# Patient Record
Sex: Male | Born: 1935 | Race: Black or African American | Hispanic: No | Smoking: Former smoker
Health system: Southern US, Community
[De-identification: ages and names within clinical notes are randomized; demographics above are authoritative.]

## PROBLEM LIST (undated history)

## (undated) ENCOUNTER — Emergency Department (HOSPITAL_COMMUNITY): Discharge status: Absent without leave | Payer: Medicare Other

## (undated) DIAGNOSIS — Z9289 Personal history of other medical treatment: Secondary | ICD-10-CM

## (undated) DIAGNOSIS — Z794 Long term (current) use of insulin: Secondary | ICD-10-CM

## (undated) DIAGNOSIS — I10 Essential (primary) hypertension: Secondary | ICD-10-CM

## (undated) DIAGNOSIS — C189 Malignant neoplasm of colon, unspecified: Secondary | ICD-10-CM

## (undated) DIAGNOSIS — E119 Type 2 diabetes mellitus without complications: Secondary | ICD-10-CM

## (undated) DIAGNOSIS — K922 Gastrointestinal hemorrhage, unspecified: Secondary | ICD-10-CM

## (undated) DIAGNOSIS — I5032 Chronic diastolic (congestive) heart failure: Secondary | ICD-10-CM

## (undated) DIAGNOSIS — F32A Depression, unspecified: Secondary | ICD-10-CM

## (undated) DIAGNOSIS — I4891 Unspecified atrial fibrillation: Secondary | ICD-10-CM

## (undated) DIAGNOSIS — F329 Major depressive disorder, single episode, unspecified: Secondary | ICD-10-CM

## (undated) DIAGNOSIS — IMO0001 Reserved for inherently not codable concepts without codable children: Secondary | ICD-10-CM

## (undated) DIAGNOSIS — E1140 Type 2 diabetes mellitus with diabetic neuropathy, unspecified: Secondary | ICD-10-CM

## (undated) DIAGNOSIS — I4892 Unspecified atrial flutter: Secondary | ICD-10-CM

## (undated) DIAGNOSIS — I509 Heart failure, unspecified: Secondary | ICD-10-CM

## (undated) DIAGNOSIS — N184 Chronic kidney disease, stage 4 (severe): Secondary | ICD-10-CM

## (undated) DIAGNOSIS — I2510 Atherosclerotic heart disease of native coronary artery without angina pectoris: Secondary | ICD-10-CM

## (undated) HISTORY — DX: Essential (primary) hypertension: I10

## (undated) HISTORY — PX: LEG SURGERY: SHX1003

---

## 1999-10-02 ENCOUNTER — Encounter: Payer: Self-pay | Admitting: Family Medicine

## 1999-10-02 ENCOUNTER — Encounter: Admission: RE | Admit: 1999-10-02 | Discharge: 1999-10-02 | Payer: Self-pay | Admitting: Family Medicine

## 1999-11-20 ENCOUNTER — Encounter: Admission: RE | Admit: 1999-11-20 | Discharge: 1999-11-20 | Payer: Self-pay | Admitting: Family Medicine

## 1999-11-20 ENCOUNTER — Encounter: Payer: Self-pay | Admitting: Family Medicine

## 1999-11-27 ENCOUNTER — Ambulatory Visit: Admission: RE | Admit: 1999-11-27 | Discharge: 1999-11-27 | Payer: Self-pay | Admitting: Family Medicine

## 1999-12-28 ENCOUNTER — Emergency Department (HOSPITAL_COMMUNITY): Admission: EM | Admit: 1999-12-28 | Discharge: 1999-12-28 | Payer: Self-pay | Admitting: Emergency Medicine

## 2000-01-22 ENCOUNTER — Encounter: Payer: Self-pay | Admitting: Family Medicine

## 2000-01-22 ENCOUNTER — Encounter: Admission: RE | Admit: 2000-01-22 | Discharge: 2000-01-22 | Payer: Self-pay | Admitting: Family Medicine

## 2000-10-10 ENCOUNTER — Encounter: Payer: Self-pay | Admitting: Family Medicine

## 2000-10-10 ENCOUNTER — Encounter: Admission: RE | Admit: 2000-10-10 | Discharge: 2000-10-10 | Payer: Self-pay | Admitting: Family Medicine

## 2006-07-23 ENCOUNTER — Ambulatory Visit: Payer: Self-pay | Admitting: Family Medicine

## 2006-07-29 ENCOUNTER — Ambulatory Visit: Payer: Self-pay | Admitting: Family Medicine

## 2007-05-06 ENCOUNTER — Encounter: Admission: RE | Admit: 2007-05-06 | Discharge: 2007-05-06 | Payer: Self-pay | Admitting: Family Medicine

## 2007-05-13 ENCOUNTER — Emergency Department (HOSPITAL_COMMUNITY): Admission: EM | Admit: 2007-05-13 | Discharge: 2007-05-13 | Payer: Self-pay | Admitting: Emergency Medicine

## 2007-06-23 ENCOUNTER — Emergency Department (HOSPITAL_COMMUNITY): Admission: EM | Admit: 2007-06-23 | Discharge: 2007-06-23 | Payer: Self-pay | Admitting: Emergency Medicine

## 2007-08-13 ENCOUNTER — Ambulatory Visit: Payer: Self-pay | Admitting: Family Medicine

## 2007-08-13 ENCOUNTER — Encounter: Admission: RE | Admit: 2007-08-13 | Discharge: 2007-08-13 | Payer: Self-pay | Admitting: Nephrology

## 2007-09-11 ENCOUNTER — Emergency Department (HOSPITAL_COMMUNITY): Admission: EM | Admit: 2007-09-11 | Discharge: 2007-09-11 | Payer: Self-pay | Admitting: *Deleted

## 2007-12-16 ENCOUNTER — Emergency Department (HOSPITAL_COMMUNITY): Admission: EM | Admit: 2007-12-16 | Discharge: 2007-12-16 | Payer: Self-pay | Admitting: Emergency Medicine

## 2007-12-25 ENCOUNTER — Emergency Department (HOSPITAL_COMMUNITY): Admission: EM | Admit: 2007-12-25 | Discharge: 2007-12-25 | Payer: Self-pay | Admitting: Emergency Medicine

## 2008-02-08 ENCOUNTER — Ambulatory Visit: Payer: Self-pay | Admitting: Family Medicine

## 2008-02-12 ENCOUNTER — Ambulatory Visit: Payer: Self-pay | Admitting: Family Medicine

## 2008-02-16 ENCOUNTER — Ambulatory Visit: Payer: Self-pay | Admitting: Family Medicine

## 2008-04-28 ENCOUNTER — Ambulatory Visit: Payer: Self-pay | Admitting: Family Medicine

## 2008-05-10 ENCOUNTER — Ambulatory Visit: Payer: Self-pay | Admitting: Family Medicine

## 2008-07-27 ENCOUNTER — Ambulatory Visit: Payer: Self-pay | Admitting: Family Medicine

## 2008-10-06 ENCOUNTER — Ambulatory Visit: Payer: Self-pay | Admitting: Family Medicine

## 2008-12-05 ENCOUNTER — Ambulatory Visit: Payer: Self-pay | Admitting: Family Medicine

## 2009-01-12 ENCOUNTER — Ambulatory Visit: Payer: Self-pay | Admitting: Family Medicine

## 2009-08-01 ENCOUNTER — Ambulatory Visit: Payer: Self-pay | Admitting: Family Medicine

## 2009-08-01 ENCOUNTER — Encounter: Admission: RE | Admit: 2009-08-01 | Discharge: 2009-08-01 | Payer: Self-pay | Admitting: Family Medicine

## 2009-08-31 ENCOUNTER — Ambulatory Visit: Payer: Self-pay | Admitting: Family Medicine

## 2009-09-06 ENCOUNTER — Ambulatory Visit: Payer: Self-pay | Admitting: Family Medicine

## 2010-05-22 ENCOUNTER — Ambulatory Visit: Payer: Self-pay | Admitting: Family Medicine

## 2010-10-05 ENCOUNTER — Ambulatory Visit: Payer: Medicare Other | Admitting: Family Medicine

## 2010-10-20 ENCOUNTER — Emergency Department (HOSPITAL_COMMUNITY)
Admission: EM | Admit: 2010-10-20 | Discharge: 2010-10-21 | Disposition: A | Discharge status: Home / Self Care | Payer: Medicare Other | Attending: Emergency Medicine | Admitting: Emergency Medicine

## 2010-10-20 DIAGNOSIS — I10 Essential (primary) hypertension: Secondary | ICD-10-CM | POA: Insufficient documentation

## 2010-10-20 DIAGNOSIS — E119 Type 2 diabetes mellitus without complications: Secondary | ICD-10-CM | POA: Insufficient documentation

## 2010-10-20 DIAGNOSIS — R040 Epistaxis: Secondary | ICD-10-CM | POA: Insufficient documentation

## 2010-10-20 DIAGNOSIS — E7800 Pure hypercholesterolemia, unspecified: Secondary | ICD-10-CM | POA: Insufficient documentation

## 2011-01-01 ENCOUNTER — Ambulatory Visit (INDEPENDENT_AMBULATORY_CARE_PROVIDER_SITE_OTHER): Payer: Medicare Other | Admitting: Family Medicine

## 2011-01-01 ENCOUNTER — Encounter: Payer: Self-pay | Admitting: Family Medicine

## 2011-01-01 DIAGNOSIS — E669 Obesity, unspecified: Secondary | ICD-10-CM

## 2011-01-01 DIAGNOSIS — Z9119 Patient's noncompliance with other medical treatment and regimen: Secondary | ICD-10-CM

## 2011-01-01 DIAGNOSIS — N400 Enlarged prostate without lower urinary tract symptoms: Secondary | ICD-10-CM

## 2011-01-01 DIAGNOSIS — E119 Type 2 diabetes mellitus without complications: Secondary | ICD-10-CM

## 2011-01-01 DIAGNOSIS — I10 Essential (primary) hypertension: Secondary | ICD-10-CM

## 2011-01-01 DIAGNOSIS — E785 Hyperlipidemia, unspecified: Secondary | ICD-10-CM

## 2011-01-01 LAB — POCT GLYCOSYLATED HEMOGLOBIN (HGB A1C): Hemoglobin A1C: 10.9

## 2011-01-01 MED ORDER — METFORMIN HCL 1000 MG PO TABS: 1000.0000 mg | ORAL_TABLET | Freq: Two times a day (BID) | ORAL | Status: DC

## 2011-01-01 MED ORDER — TERAZOSIN HCL 5 MG PO CAPS: 5.0000 mg | ORAL_CAPSULE | Freq: Every day | ORAL | Status: DC

## 2011-01-01 MED ORDER — PRAVASTATIN SODIUM 40 MG PO TABS: 40.0000 mg | ORAL_TABLET | Freq: Every evening | ORAL | Status: DC

## 2011-01-01 MED ORDER — LISINOPRIL-HYDROCHLOROTHIAZIDE 20-12.5 MG PO TABS: 1.0000 | ORAL_TABLET | Freq: Every day | ORAL | Status: DC

## 2011-01-01 MED ORDER — GLIPIZIDE 10 MG PO TABS: 10.0000 mg | ORAL_TABLET | Freq: Two times a day (BID) | ORAL | Status: DC

## 2011-01-01 MED ORDER — FUROSEMIDE 40 MG PO TABS: 40.0000 mg | ORAL_TABLET | Freq: Every day | ORAL | Status: DC

## 2011-01-01 NOTE — Progress Notes (Signed)
  Subjective:    Patient ID: [name], male    DOB: 1934-05-29, 77 y.o.   MRN: [id number]  HPI he is here because he ran out of his [place] and. He is complaining of difficulty with urinating. He is unsure vein the medications he is taking and can only tell me the color of the pill or the container that comes in. He has not been checking his blood sugars regularly.    Review of Systems Not done    Objective:   Physical Exam alert and in no distress. Exam of both feet shows diffuse 3+ pitting edema.       Assessment & Plan:  Noncompliant with overall care. Diabetes. Hypertension. Dyslipidemia.

## 2011-01-01 NOTE — Patient Instructions (Signed)
I called and all of your medications. Please take all of them. I will see you in 2 weeks and bring all your medications and at that time.

## 2011-01-02 ENCOUNTER — Encounter: Payer: Self-pay | Admitting: Family Medicine

## 2011-01-02 LAB — LIPID PANEL
Cholesterol: 178 mg/dL (ref 0–200)
HDL: 33 mg/dL — ABNORMAL LOW (ref >39)
Triglycerides: 220 mg/dL — ABNORMAL HIGH (ref <150)

## 2011-01-03 ENCOUNTER — Telehealth: Payer: Self-pay

## 2011-01-03 NOTE — Telephone Encounter (Signed)
Talked with pt about labs told pt to make sure and take med. Pt aware [name]

## 2011-01-15 ENCOUNTER — Ambulatory Visit (INDEPENDENT_AMBULATORY_CARE_PROVIDER_SITE_OTHER): Payer: Medicare Other | Admitting: Family Medicine

## 2011-01-15 ENCOUNTER — Encounter: Payer: Self-pay | Admitting: Family Medicine

## 2011-01-15 DIAGNOSIS — I890 Lymphedema, not elsewhere classified: Secondary | ICD-10-CM

## 2011-01-15 DIAGNOSIS — K5900 Constipation, unspecified: Secondary | ICD-10-CM

## 2011-01-15 DIAGNOSIS — K219 Gastro-esophageal reflux disease without esophagitis: Secondary | ICD-10-CM

## 2011-01-15 NOTE — Patient Instructions (Signed)
Use Prilosec when you have difficulty with reflux. You can take 2 of those if you need to. Use Metamucil regularly for the constipation We will set you up to to the wound clinic for your leg.

## 2011-01-15 NOTE — Progress Notes (Signed)
  Subjective:    Patient ID: [name], male    DOB: 31-Mar-1934, 77 y.o.   MRN: [id number]  HPI he is here for a followup visit. He has 3 main complaints today. He has had difficulty for the last several years with what he calls reflux. He will occasionally feel as if he gets things stuck in his throat. He cannot distinguish solids versus liquids. He has not tried any medications to help this. Recently he has had some difficulty with vomiting with this. He also complains of constipation stating he can go one week without having a BM unless he uses medication. He continues to have difficulty with his feet and legs. He has had problems with this over the last several years but has not gotten appropriate followup. He continues on medications listed in the chart. It is very difficult to get him to stay on task and give a good coherent history.    Review of Systems     Objective:   Physical Exam Alert and in no distress. Exam of his lower extremities shows diffuse edema especially on the left with a healing lesion on the anterior shin.       Assessment & Plan:  GERD. Constipation. Lymphedema. He will continue on his present medication regimen. Recommend Metamucil for the constipation and he is to use this regularly. Also will have him use Prilosec to help with the reflux symptoms. Referral to the wound clinic. Recheck here in 4 months.

## 2011-01-18 ENCOUNTER — Inpatient Hospital Stay (HOSPITAL_COMMUNITY)
Admission: EM | Admit: 2011-01-18 | Discharge: 2011-01-22 | DRG: 603 | Disposition: A | Discharge status: Home Health | Payer: Medicare Other | Attending: Emergency Medicine | Admitting: Emergency Medicine

## 2011-01-18 DIAGNOSIS — K5909 Other constipation: Secondary | ICD-10-CM | POA: Diagnosis not present

## 2011-01-18 DIAGNOSIS — E876 Hypokalemia: Secondary | ICD-10-CM | POA: Diagnosis not present

## 2011-01-18 DIAGNOSIS — E785 Hyperlipidemia, unspecified: Secondary | ICD-10-CM | POA: Diagnosis present

## 2011-01-18 DIAGNOSIS — N400 Enlarged prostate without lower urinary tract symptoms: Secondary | ICD-10-CM | POA: Diagnosis present

## 2011-01-18 DIAGNOSIS — Z79899 Other long term (current) drug therapy: Secondary | ICD-10-CM

## 2011-01-18 DIAGNOSIS — D61818 Other pancytopenia: Secondary | ICD-10-CM | POA: Diagnosis present

## 2011-01-18 DIAGNOSIS — E119 Type 2 diabetes mellitus without complications: Secondary | ICD-10-CM | POA: Diagnosis present

## 2011-01-18 DIAGNOSIS — N179 Acute kidney failure, unspecified: Secondary | ICD-10-CM | POA: Diagnosis present

## 2011-01-18 DIAGNOSIS — N182 Chronic kidney disease, stage 2 (mild): Secondary | ICD-10-CM | POA: Diagnosis present

## 2011-01-18 DIAGNOSIS — I129 Hypertensive chronic kidney disease with stage 1 through stage 4 chronic kidney disease, or unspecified chronic kidney disease: Secondary | ICD-10-CM | POA: Diagnosis present

## 2011-01-18 DIAGNOSIS — I872 Venous insufficiency (chronic) (peripheral): Secondary | ICD-10-CM | POA: Diagnosis present

## 2011-01-18 DIAGNOSIS — T40605A Adverse effect of unspecified narcotics, initial encounter: Secondary | ICD-10-CM | POA: Diagnosis not present

## 2011-01-18 DIAGNOSIS — L02419 Cutaneous abscess of limb, unspecified: Principal | ICD-10-CM | POA: Diagnosis present

## 2011-01-18 LAB — CBC
MCH: 29.6 pg (ref 26.0–34.0)
MCV: 85 fL (ref 78.0–100.0)
Platelets: 137 10*3/uL — ABNORMAL LOW (ref 150–400)
RBC: 4.12 MIL/uL — ABNORMAL LOW (ref 4.22–5.81)

## 2011-01-18 LAB — DIFFERENTIAL
Eosinophils Absolute: 0 10*3/uL (ref 0.0–0.7)
Lymphs Abs: 1.2 10*3/uL (ref 0.7–4.0)
Monocytes Absolute: 0.7 10*3/uL (ref 0.1–1.0)
Monocytes Relative: 20 % — ABNORMAL HIGH (ref 3–12)
Neutrophils Relative %: 45 % (ref 43–77)

## 2011-01-18 LAB — BASIC METABOLIC PANEL
BUN: 23 mg/dL (ref 6–23)
CO2: 31 mEq/L (ref 19–32)
Chloride: 98 mEq/L (ref 96–112)
Creatinine, Ser: 1.33 mg/dL (ref 0.4–1.5)

## 2011-01-19 ENCOUNTER — Inpatient Hospital Stay (HOSPITAL_COMMUNITY): Payer: Medicare Other

## 2011-01-19 LAB — GLUCOSE, CAPILLARY
Glucose-Capillary: 147 mg/dL — ABNORMAL HIGH (ref 70–99)
Glucose-Capillary: 157 mg/dL — ABNORMAL HIGH (ref 70–99)
Glucose-Capillary: 179 mg/dL — ABNORMAL HIGH (ref 70–99)
Glucose-Capillary: 179 mg/dL — ABNORMAL HIGH (ref 70–99)

## 2011-01-19 LAB — LIPID PANEL
HDL: 32 mg/dL — ABNORMAL LOW (ref >39)
Total CHOL/HDL Ratio: 4.5 RATIO

## 2011-01-19 LAB — HEPATIC FUNCTION PANEL
Albumin: 3.5 g/dL (ref 3.5–5.2)
Total Protein: 6.7 g/dL (ref 6.0–8.3)

## 2011-01-19 LAB — FERRITIN: Ferritin: 85 ng/mL (ref 22–322)

## 2011-01-19 LAB — FOLATE: Folate: 15.6 ng/mL

## 2011-01-19 LAB — IRON AND TIBC: Iron: 52 ug/dL (ref 42–135)

## 2011-01-20 ENCOUNTER — Inpatient Hospital Stay (HOSPITAL_COMMUNITY): Payer: Medicare Other

## 2011-01-20 LAB — GLUCOSE, CAPILLARY
Glucose-Capillary: 169 mg/dL — ABNORMAL HIGH (ref 70–99)
Glucose-Capillary: 214 mg/dL — ABNORMAL HIGH (ref 70–99)

## 2011-01-20 LAB — CBC
HCT: 30.6 % — ABNORMAL LOW (ref 39.0–52.0)
Hemoglobin: 10.7 g/dL — ABNORMAL LOW (ref 13.0–17.0)
MCH: 30 pg (ref 26.0–34.0)
MCV: 85.7 fL (ref 78.0–100.0)
Platelets: 130 10*3/uL — ABNORMAL LOW (ref 150–400)
RBC: 3.57 MIL/uL — ABNORMAL LOW (ref 4.22–5.81)
WBC: 3.4 10*3/uL — ABNORMAL LOW (ref 4.0–10.5)

## 2011-01-20 LAB — BASIC METABOLIC PANEL
BUN: 19 mg/dL (ref 6–23)
CO2: 32 mEq/L (ref 19–32)
Chloride: 101 mEq/L (ref 96–112)
Creatinine, Ser: 1.51 mg/dL — ABNORMAL HIGH (ref 0.4–1.5)
Potassium: 3.9 mEq/L (ref 3.5–5.1)

## 2011-01-20 MED ORDER — GADOBENATE DIMEGLUMINE 529 MG/ML IV SOLN
20.0000 mL | Freq: Once | INTRAVENOUS | Status: AC | PRN
  Administered 2011-01-20: 20 mL via INTRAVENOUS

## 2011-01-21 LAB — RENAL FUNCTION PANEL
CO2: 32 mEq/L (ref 19–32)
Chloride: 104 mEq/L (ref 96–112)
Creatinine, Ser: 1.39 mg/dL (ref 0.4–1.5)
GFR calc Af Amer: >60 mL/min (ref >60)
GFR calc non Af Amer: 50 mL/min — ABNORMAL LOW (ref >60)
Potassium: 4 mEq/L (ref 3.5–5.1)

## 2011-01-21 LAB — GLUCOSE, CAPILLARY
Glucose-Capillary: 119 mg/dL — ABNORMAL HIGH (ref 70–99)
Glucose-Capillary: 152 mg/dL — ABNORMAL HIGH (ref 70–99)
Glucose-Capillary: 143 mg/dL — ABNORMAL HIGH (ref 70–99)

## 2011-01-22 LAB — BASIC METABOLIC PANEL
BUN: 16 mg/dL (ref 6–23)
CO2: 31 mEq/L (ref 19–32)
Chloride: 102 mEq/L (ref 96–112)
Glucose, Bld: 144 mg/dL — ABNORMAL HIGH (ref 70–99)
Potassium: 3.8 mEq/L (ref 3.5–5.1)
Sodium: 141 mEq/L (ref 135–145)

## 2011-01-22 LAB — GLUCOSE, CAPILLARY
Glucose-Capillary: 155 mg/dL — ABNORMAL HIGH (ref 70–99)
Glucose-Capillary: 223 mg/dL — ABNORMAL HIGH (ref 70–99)

## 2011-01-22 NOTE — H&P (Signed)
NAME:  [name], [name] NO.:  [id number]  MEDICAL RECORD NO.:  [id number]           PATIENT TYPE:  I  LOCATION:  5525                         FACILITY:  [facility]  PHYSICIAN:  Hillery Aldo, M.D.   DATE OF BIRTH:  [birth year]  DATE OF ADMISSION:  01/18/2011 DATE OF DISCHARGE:                             HISTORY & PHYSICAL   PRIMARY CARE PHYSICIAN:  Sharlot Gowda, MD  CHIEF COMPLAINT:  Skin tears to the left lower extremity.  HISTORY OF PRESENT ILLNESS:  The patient is a 77 year old male who was brought to the hospital via EMS with complaints of swollen legs.  The patient cannot say how long his legs have been swollen for, but thinks it has been over a year.  When asked why he called EMS tonight, the patient states that he has developed some chafing and wounds on the left lower extremity.  He denies any associated fever or chills, but does report one episode of vomiting and an episode of headache earlier today. The patient is extremely slow to give any information and is very monosyllabic and does not elaborate even when asked detailed questions.  PAST MEDICAL HISTORY: 1. Hypertension. 2. Diabetes. 3. Dyslipidemia. 4. Benign prostatic hypertrophy.  PAST SURGICAL HISTORY:  Unspecified lower extremity surgery secondary to trauma.  FAMILY HISTORY:  The patient's mother died at 33 from brain tumor.  The patient never knew his father.  He has one stepdaughter and one daughter.  SOCIAL HISTORY:  The patient is widowed x4 years and lives alone.  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 Denies alcohol or drug use.  ALLERGIES:  No known drug allergies.  MEDICATIONS:  The patient is unable to say what medications he takes, but Lasix is listed in his medicine list done by the nurses.  The patient cannot tell me specifically what he takes.  When asked what he takes for his diabetes, the patient states that he thinks taking  these medications has caused his current problems.  REVIEW OF SYSTEMS:  CONSTITUTIONAL:  No fever or chills.  No changes in appetite.  HEENT:  Positive for headache, but otherwise negative. CARDIOVASCULAR:  No chest pain or dysrhythmia.  RESPIRATORY:  No shortness of breath or cough.  GI:  Positive for one episode of nausea and vomiting.  No diarrhea, melena, or hematochezia.  GU:  Problems for occasional difficulty with urination.  MUSCULOSKELETAL:  Bilateral lower extremity pain.  A comprehensive 14-point review of systems is otherwise unremarkable.  PHYSICAL EXAMINATION:  VITAL SIGNS:  Temperature 98.6, pulse 64, respirations 16, blood pressure 180/80, and O2 saturation 99% on room air. GENERAL:  A morbidly obese male in no acute distress. HEENT:  Normocephalic, atraumatic.  PERRL.  EOMI.  Oropharynx is clear with dentures to the upper and lower palate.  Mucous membranes are moist. NECK:  Supple, no thyromegaly, no lymphadenopathy, no jugular venous distention. CHEST:  Diminished breath sounds bilaterally, but clear. HEART:  Regular rate, rhythm.  No murmurs, rubs, or gallops. ABDOMEN:  Soft, nontender, nondistended with normoactive bowel sounds. EXTREMITIES:  Marked swelling to the bilateral lower extremities with  skin tears on the left and erythema surrounding the skin tears. SKIN:  Warm and dry.  No rashes.  Skin tears as described above. NEUROLOGIC:  The patient is alert and oriented x3.  He is very slow to respond and almost appears to be paranoid with an underlying memory disorder.  Cranial nerves II-XII are grossly intact and his neuro exam is nonfocal. PSYCHIATRIC:  Flat affect with very slow responses.  DATA REVIEW:  Sodium is 137, potassium 4.1, chloride 98, bicarb 31, BUN 23, creatinine 1.33, glucose 151, calcium 10.  White blood cell count of 3.4, hemoglobin 12.2, hematocrit 35, platelets 137.  ASSESSMENT/PLAN: 1. Left lower extremity cellulitis in the setting of  profound lower     extremity edema.  We will admit the patient and start him     empirically on vancomycin  and Zosyn.  We will attempt culture of     the wound drainage to narrow antibiotics if possible.  Blood     cultures have been drawn.  To evaluate the source of his massive     lower extremity edema, we would obtain a two-dimensional     echocardiogram and check his albumin. 2. Stage II chronic kidney disease:  The patient's baseline creatinine     is not currently known.  We will hydrate the patient and monitor     his renal function closely.  I suspect this is chronic kidney     disease as opposed to acute renal failure. 3. Pancytopenia:  The patient does have pancytopenia of uncertain     etiology.  We will check an anemia panel. 4. Hypertension:  The patient's blood pressure medications are not     currently well known and I suspect he is noncompliant given his     admission blood pressure of 180/80.  We will start him on Norvasc     and clonidine and use hydralazine p.r.n. until his home medications     can be verified.  He likely will benefit from an ACE inhibitor, but     would prefer to resume this once his baseline creatinine has been     confirmed and acute renal failure has been ruled out. 5. Type 2 diabetes:  The patient will be placed on sliding scale     insulin for now.  We will check a hemoglobin A1c to determine his     underlying glycemic control. 6. Hyperlipidemia:  We will check a fasting lipid panel in the     morning.  Unclear if he is taking any medications for this at the     present time. 7. Questionable prostatomegaly:  We will check the patient's PSA and     monitor his intake and output closely.  We     will check a urinalysis to rule out underlying UTI. 8. Prophylaxis:  We will place the patient on Lovenox for DVT     prophylaxis.  Time spent on admission including face-to-face time is approximately 1 hour.     Hillery Aldo,  M.D.     CR/MEDQ  D:  01/18/2011  T:  01/19/2011  Job:  [id number]  cc:   Sharlot Gowda, M.D.  Electronically Signed by Hillery Aldo M.D. on 01/22/2011 01:11:17 PM

## 2011-01-23 NOTE — Discharge Summary (Signed)
NAME:  [name], [name] NO.:  [id number]  MEDICAL RECORD NO.:  [id number]           PATIENT TYPE:  I  LOCATION:  5525                         FACILITY:  [facility]  PHYSICIAN:  Rosanna Randy, MDDATE OF BIRTH:  [birth year]  DATE OF ADMISSION:  01/18/2011 DATE OF DISCHARGE:  01/22/2011                              DISCHARGE SUMMARY   DISCHARGE DIAGNOSES: 1. Left lower extremity wound/cellulitis. 2. Acute on chronic renal failure, stage I to II at baseline. 3. Benign prostatic hypertrophy. 4. Hypertension. 5. Hyperlipidemia. 6. Diabetes. 7. Venous insufficiency with stasis dermatitis. 8. Dyslipidemia. 9. Morbid obesity. 10.Constipation.  DISCHARGE MEDICATIONS: 1. Amlodipine 10 mg 1 tablet by mouth daily. 2. Aspirin 81 mg 1 tablet by mouth daily. 3. Clonidine 0.2 mg tablets by mouth twice a day. 4. Doxycycline 100 mg tablet by mouth twice a day for 5 more days. 5. Lovaza 1 g capsule by mouth daily. 6. Senokot 2 tablets by mouth daily as needed for constipation. 7. Lasix 40 mg 1 tablet by mouth daily. 8. Glipizide 10 mg 1 tablet by mouth daily. 9. Metformin 1000 mg 1 tablet by mouth twice a day. 10.Pravachol 40 mg 1 tablet by mouth every evening. 11.Terazosin 5 mg 1 tablet by mouth daily.  The patient was advised to stop using lisinopril/hydrochlorothiazide until he follows with his primary care physician.  PROCEDURES PERFORMED DURING DURING THIS HOSPITALIZATION:  MRI of tibia fibula of the left lower extremity that demonstrated extensive cellulitis as well as soft tissue edema in the left lower leg. Extensive soft tissue edema in the right lower leg to a lesser degree. No evidence of osteomyelitis, myositis, or soft tissue abscess.  The patient also had a 2-D echo performed during this hospitalization, but final reports are still pending at the moment of this dictation.  Plan is to follow up this reports and communicate with primary care physician the  results of these studies.  No other procedures were performed during this admission.  No consultations were made.  BRIEF HISTORY OF PRESENT ILLNESS:  For full details, please refer to dictation done by Dr. Hillery Aldo on Jan 19, 2011, but briefly the patient is a 77 year old male who was brought to the hospital via EMS with complaints of swollen legs.  The patient cannot say how long his legs have been swelling, but thinks that he it had been over a year and slowly getting worse.  The patient was asked why he called EMS tonight, the patient said that he has developed some chasing and wounds on the left lower extremity.  He denies any associated fever or chills, but does report one episode of vomiting and an episode of headache earlier today.  The patient is extremely slow to give any information and is very monosyllabic and does not elaborate even when asked detailed questions.  PERTINENT LABORATORY DATA:  Throughout this hospitalization includes, a CBC on admission with white blood cells of 3.4, hemoglobin of 12.2, and platelets 137.  Basic metabolic panel showed creatinine of 1.33 with a GFR of 53.  BUN was 23, glucose 151, CO2 of 31, chloride 98, potassium 4.1, sodium 137.  Lipid  profile showed total cholesterol of 143, triglycerides 108, HDL 32, LDL 89, normal hepatic function panel. Hemoglobin A1c 10.7.  Anemia panel demonstrated total iron of 52. Vitamin B12 233.  Ferritin 85.  TSH was 2.664, PSA at 0.70,  CBC following his admission demonstrated white blood cells 3.4 with a hemoglobin of 10.7, and platelets 130.  The patient had subsequent basic metabolic panels and renal function panel in order to follow his acute renal failure with final BMET done on the moment of discharge that demonstrated a sodium of 141, potassium 3.8, chloride 102, bicarb 31, glucose 144, BUN 16, creatinine 1.19, GFR was more than 60, calcium was 9.1.  HOSPITAL COURSE BY PROBLEM: 1. Left lower  extremity wound/cellulitis.  At this moment, the patient     had received a total of 5 days of antibiotics with a plan to     continue for a total of 10 days.  The patient initially received     vancomycin and Zosyn for 4 days with a subsequent change to     doxycycline 100 mg by mouth twice a day to complete antibiotic     therapy.  The patient is going to be discharged on this antibiotic.     He is going to follow with primary care physician and a home health     nurse for wound care had been arranged for him.  At discharge, the     patient was not having significant tenderness.  There is improved     in the swelling of his lower extremities, and there is also less     redness.  The wound suppurating at this time. 2. Acute-on-chronic renal failure.  Looking back into his labs from     previous admission and stage I to II baseline renal failure, most     likely secondary to hypertension and diabetes was found, but     worsening on admission with a subsequent elevation up to 1.59 after     the patient was on vancomycin and Zosyn.  At this moment, the     patient received fluid resuscitation.  The antibiotics were     adjusted for his renal failure and the patient's ACE inhibitors,     hydrochlorothiazide, and metformin were stopped.  After this     approach was taken by the time of discharge, the patient's renal     failure returned to baseline with a creatinine of 1.19.  At this     moment, we are going to continue holding on his ACE inhibitors and     also on his hydrochlorothiazide.  We are going to restart his     metformin, and we are going also to continue his Lasix.  The     patient is going to follow with primary care physician who will     repeat basic metabolic panel as an outpatient to make sure that the     patient's renal failure continue improving, and there is no     regression.  At that time, he will also require to be restarted on     medications to protect his kidneys  since he is a diabetic. 3. BPH with PSA just mildly elevated that will go along with the     diagnosis.  We are going to continue using terazosin. 4. Hypertension.  At this point, we have discharged the patient on     amlodipine and clonidine because of the renal failure.  The patient  is also using Lasix that will help with the swelling and also with     the blood pressure.  This is something that needs to be adjusted     and readdressed as an outpatient. 5. Venous insufficiency with stasis dermatitis.  We are going to     provide treatment.  The patient had been instructed to elevate his     legs.  He will require wrapping therapy to help with this venous     edema/stasis as an outpatient once the swelling has improved. 6. Diabetes with a hemoglobin A1c of 10.7.  He is going to continue     using metformin and glipizide, but he will need a close followup     with primary care physician and most likely the use of insulin for     better control of his diabetes. 7. Dyslipidemia.  The patient had been started on fish oil to take 1 g     by mouth daily, and had been instructed to follow a low-fat diet. 8. Morbid obesity.  The patient had been instructed to follow a low-     calorie diet and to increase his activity level. 9. Hyperlipidemia with a lipid profile showing well control on his     LDL.  We are going to continue using Pravachol. 10.Constipation secondary to the use of narcotics.  The patient had     been started on Senokot in order to help with his constipation.     Prior to discharge, the patient had achieved good bowel movement     and is going to continue using p.r.n. Senokot as an outpatient.  VITAL SIGNS:  At discharge, the patient's temperature 98.5 with a heart rate of 64, respiratory rate 18, blood pressure 126/70, oxygen saturation 96% on room air. GENERAL:  The patient was in no acute distress. RESPIRATORY:  Clear to auscultation bilaterally. HEART:  Regular rate  and rhythm. ABDOMEN:  Soft and nontender.  Positive bowel sounds. EXTREMITIES:  Less swollen, [place] with a 2 to 3+ plus edema bilaterally up to his knees and also a left external anterior open wound without any suppressions.  The patient also with a chronic stasis dermatitis bilaterally NEUROLOGIC:  Grossly intact.     Rosanna Randy, MD     CEM/MEDQ  D:  01/22/2011  T:  01/23/2011  Job:  [id number]  cc:   Sharlot Gowda, M.D.  Electronically Signed by Vassie Loll MD on 01/23/2011 01:41:31 PM

## 2011-01-25 ENCOUNTER — Encounter (HOSPITAL_BASED_OUTPATIENT_CLINIC_OR_DEPARTMENT_OTHER): Payer: Medicare Other

## 2011-01-25 LAB — CULTURE, BLOOD (ROUTINE X 2)
Culture  Setup Time: 201205260546
Culture: NO GROWTH

## 2011-03-01 ENCOUNTER — Encounter (HOSPITAL_BASED_OUTPATIENT_CLINIC_OR_DEPARTMENT_OTHER): Payer: Medicare Other

## 2011-03-04 ENCOUNTER — Telehealth: Payer: Self-pay

## 2011-03-04 ENCOUNTER — Other Ambulatory Visit: Payer: Self-pay

## 2011-03-04 NOTE — Telephone Encounter (Signed)
CALLED PT TO LET HIM KNOW DR.LALONDE WANTS HIM TO HAVE APT BEFORE HE FILLS MED

## 2011-03-05 ENCOUNTER — Ambulatory Visit (INDEPENDENT_AMBULATORY_CARE_PROVIDER_SITE_OTHER): Payer: Medicare Other | Admitting: Family Medicine

## 2011-03-05 DIAGNOSIS — L03116 Cellulitis of left lower limb: Secondary | ICD-10-CM

## 2011-03-05 DIAGNOSIS — L02419 Cutaneous abscess of limb, unspecified: Secondary | ICD-10-CM

## 2011-03-05 DIAGNOSIS — Z9119 Patient's noncompliance with other medical treatment and regimen: Secondary | ICD-10-CM

## 2011-03-05 DIAGNOSIS — Z79899 Other long term (current) drug therapy: Secondary | ICD-10-CM

## 2011-03-05 DIAGNOSIS — E119 Type 2 diabetes mellitus without complications: Secondary | ICD-10-CM

## 2011-03-05 LAB — POCT GLYCOSYLATED HEMOGLOBIN (HGB A1C): Hemoglobin A1C: 8.1

## 2011-03-05 MED ORDER — DOXYCYCLINE HYCLATE 100 MG PO TABS: 100.0000 mg | ORAL_TABLET | Freq: Two times a day (BID) | ORAL | Status: AC

## 2011-03-05 MED ORDER — FUROSEMIDE 40 MG PO TABS: 40.0000 mg | ORAL_TABLET | Freq: Every day | ORAL | Status: DC

## 2011-03-05 MED ORDER — LISINOPRIL-HYDROCHLOROTHIAZIDE 20-12.5 MG PO TABS: 1.0000 | ORAL_TABLET | Freq: Every day | ORAL | Status: DC

## 2011-03-05 MED ORDER — METFORMIN HCL 1000 MG PO TABS: 1000.0000 mg | ORAL_TABLET | Freq: Two times a day (BID) | ORAL | Status: DC

## 2011-03-05 MED ORDER — TERAZOSIN HCL 5 MG PO CAPS: 5.0000 mg | ORAL_CAPSULE | Freq: Every day | ORAL | Status: DC

## 2011-03-05 MED ORDER — GLIPIZIDE 10 MG PO TABS: 10.0000 mg | ORAL_TABLET | Freq: Two times a day (BID) | ORAL | Status: DC

## 2011-03-05 MED ORDER — PRAVASTATIN SODIUM 40 MG PO TABS: 40.0000 mg | ORAL_TABLET | Freq: Every evening | ORAL | Status: DC

## 2011-03-05 NOTE — Progress Notes (Signed)
  Subjective:    Patient ID: [name], male    DOB: 30-Mar-1934, 77 y.o.   MRN: [id number]  HPI  he is here for a follow up visit. He was last seen prior to hospitalization for treatment of cellulitis. He has multiple underlying medical conditions. He was [facility] and apparently the wound at the end of the two-week course was essentially healed. He was also followup with me however did not come in until today. He states that the wound is getting worse.   Review of Systems     Objective:   Physical Exam Alert and in no distress. Left lower extremity is quite edematous dry and scaly. It is slightly erythematous.       Assessment & Plan:  Poor compliance with overall care. Diabetes. Cellulitis of the left leg. I will have the [facility] go by and see him and give me their input into the status of this. I will also place him back on the antibiotic he was on. Followup will depend on input from the nurse. I received a call from the nurse to had seen him in the past. She indicated that the leg is much worse. We will therefore have home health get involved in the case and see how he does. He did admit to them that he has been taking tub baths although he was told not to. He also stated that he did not intend to come here but indeed did show up Several phone calls were made to coordinate his care.

## 2011-03-05 NOTE — Patient Instructions (Addendum)
I called in doxycycline. Go ahead and pick that up. The nurse will come by and evaluate your wound. I will then discuss with her all the medications you are on the make sure you get on the correct ones.

## 2011-03-06 LAB — CBC WITH DIFFERENTIAL/PLATELET
Basophils Absolute: 0 10*3/uL (ref 0.0–0.1)
Eosinophils Absolute: 0 10*3/uL (ref 0.0–0.7)
Lymphocytes Relative: 37 % (ref 12–46)
Lymphs Abs: 1 10*3/uL (ref 0.7–4.0)
Neutrophils Relative %: 45 % (ref 43–77)
Platelets: 170 10*3/uL (ref 150–400)
RBC: 3.55 MIL/uL — ABNORMAL LOW (ref 4.22–5.81)
RDW: 14.1 % (ref 11.5–15.5)
WBC: 2.7 10*3/uL — ABNORMAL LOW (ref 4.0–10.5)

## 2011-03-06 LAB — COMPREHENSIVE METABOLIC PANEL
ALT: 9 U/L (ref 0–53)
AST: 9 U/L (ref 0–37)
CO2: 30 mEq/L (ref 19–32)
Chloride: 101 mEq/L (ref 96–112)
Creat: 1.35 mg/dL (ref 0.50–1.35)
Sodium: 143 mEq/L (ref 135–145)
Total Bilirubin: 0.5 mg/dL (ref 0.3–1.2)
Total Protein: 7.2 g/dL (ref 6.0–8.3)

## 2011-03-06 LAB — LIPID PANEL
Cholesterol: 114 mg/dL (ref 0–200)
Total CHOL/HDL Ratio: 4.1 Ratio
VLDL: 28 mg/dL (ref 0–40)

## 2011-03-07 ENCOUNTER — Telehealth: Payer: Self-pay

## 2011-03-07 NOTE — Telephone Encounter (Signed)
Called pt to inform him of his labs but his mail box is full

## 2011-03-11 ENCOUNTER — Telehealth: Payer: Self-pay | Admitting: Family Medicine

## 2011-03-11 NOTE — Telephone Encounter (Signed)
I received a call from Angie with home health. He stated to her that he was going to stop his Lasix and lisinopril.

## 2011-03-21 ENCOUNTER — Encounter: Payer: Self-pay | Admitting: Family Medicine

## 2011-03-25 ENCOUNTER — Encounter: Payer: Self-pay | Admitting: Family Medicine

## 2011-03-25 ENCOUNTER — Ambulatory Visit (INDEPENDENT_AMBULATORY_CARE_PROVIDER_SITE_OTHER): Payer: Medicare Other | Admitting: Family Medicine

## 2011-03-25 VITALS — BP 112/72 | HR 68 | Ht 71.0 in | Wt 251.0 lb

## 2011-03-25 DIAGNOSIS — Z9119 Patient's noncompliance with other medical treatment and regimen: Secondary | ICD-10-CM

## 2011-03-25 DIAGNOSIS — L02619 Cutaneous abscess of unspecified foot: Secondary | ICD-10-CM

## 2011-03-25 DIAGNOSIS — I890 Lymphedema, not elsewhere classified: Secondary | ICD-10-CM

## 2011-03-25 DIAGNOSIS — E11628 Type 2 diabetes mellitus with other skin complications: Secondary | ICD-10-CM

## 2011-03-25 DIAGNOSIS — E1169 Type 2 diabetes mellitus with other specified complication: Secondary | ICD-10-CM

## 2011-03-25 MED ORDER — DOXYCYCLINE HYCLATE 100 MG PO TABS: 100.0000 mg | ORAL_TABLET | Freq: Two times a day (BID) | ORAL | Status: AC

## 2011-03-25 NOTE — Patient Instructions (Addendum)
Stay on these medications. Recheck here in a month. If you  keep vomiting you give me a call and come in and let me see you sooner than one month

## 2011-03-25 NOTE — Progress Notes (Signed)
  Subjective:    Patient ID: [name], male    DOB: Jun 25, 1934, 77 y.o.   MRN: [id number]  HPI He is here for recheck. He does have less swelling in both legs. Home health is coming by on a regular basis. He does complain of intermittent vomiting however getting a good history from him is again quite difficult.   Review of Systems     Objective:   Physical Exam Alert and in no distress. Exam of both legs does show decreased swelling but erythema is left greater than right of his lower extremities.       Assessment & Plan:  Chronic lymphedema with cellulitis. Noncompliance. Diabetes. Hypertension. Axis I can was called in. He is to continue to be followed by home health. He will return here in one month or sooner if continued difficulty with vomiting

## 2011-04-23 ENCOUNTER — Observation Stay (HOSPITAL_COMMUNITY)
Admission: EM | Admit: 2011-04-23 | Discharge: 2011-04-24 | Disposition: A | Discharge status: Home / Self Care | Payer: Medicare Other | Attending: Emergency Medicine | Admitting: Emergency Medicine

## 2011-04-23 DIAGNOSIS — E119 Type 2 diabetes mellitus without complications: Secondary | ICD-10-CM | POA: Insufficient documentation

## 2011-04-23 DIAGNOSIS — E7800 Pure hypercholesterolemia, unspecified: Secondary | ICD-10-CM | POA: Insufficient documentation

## 2011-04-23 DIAGNOSIS — I10 Essential (primary) hypertension: Secondary | ICD-10-CM | POA: Insufficient documentation

## 2011-04-23 DIAGNOSIS — L02419 Cutaneous abscess of limb, unspecified: Principal | ICD-10-CM | POA: Insufficient documentation

## 2011-04-23 LAB — CBC
HCT: 31.7 % — ABNORMAL LOW (ref 39.0–52.0)
Hemoglobin: 10.9 g/dL — ABNORMAL LOW (ref 13.0–17.0)
MCH: 29.7 pg (ref 26.0–34.0)
MCHC: 34.4 g/dL (ref 30.0–36.0)
MCV: 86.4 fL (ref 78.0–100.0)
RBC: 3.67 MIL/uL — ABNORMAL LOW (ref 4.22–5.81)

## 2011-04-23 LAB — BASIC METABOLIC PANEL
BUN: 22 mg/dL (ref 6–23)
CO2: 34 mEq/L — ABNORMAL HIGH (ref 19–32)
Calcium: 9.7 mg/dL (ref 8.4–10.5)
GFR calc non Af Amer: 55 mL/min — ABNORMAL LOW (ref >60)
Glucose, Bld: 94 mg/dL (ref 70–99)
Sodium: 139 mEq/L (ref 135–145)

## 2011-04-23 LAB — SEDIMENTATION RATE: Sed Rate: 30 mm/hr — ABNORMAL HIGH (ref 0–16)

## 2011-04-23 LAB — DIFFERENTIAL
Lymphocytes Relative: 32 % (ref 12–46)
Lymphs Abs: 1.1 10*3/uL (ref 0.7–4.0)
Monocytes Absolute: 0.7 10*3/uL (ref 0.1–1.0)
Monocytes Relative: 20 % — ABNORMAL HIGH (ref 3–12)
Neutro Abs: 1.6 10*3/uL — ABNORMAL LOW (ref 1.7–7.7)
Neutrophils Relative %: 48 % (ref 43–77)

## 2011-04-25 ENCOUNTER — Encounter: Payer: Self-pay | Admitting: Family Medicine

## 2011-04-25 ENCOUNTER — Ambulatory Visit (INDEPENDENT_AMBULATORY_CARE_PROVIDER_SITE_OTHER): Payer: Medicare Other | Admitting: Family Medicine

## 2011-04-25 VITALS — BP 149/90 | HR 87 | Wt 261.0 lb

## 2011-04-25 DIAGNOSIS — I890 Lymphedema, not elsewhere classified: Secondary | ICD-10-CM

## 2011-04-25 NOTE — Progress Notes (Signed)
  Subjective:    Patient ID: [name], male    DOB: 1934-05-01, 77 y.o.   MRN: [id number]  HPI He is here for a recheck. He was recently seen in the emergency room and given an antibiotic however he did not fill it. He continues to be followed at home by home health. He does state that his leg is doing better. They are making dressing changes regularly.   Review of Systems     Objective:   Physical Exam Alert and in no distress. Exam of the left leg does show edema however he does appear to be much less. Is not warm, red or tender. The right leg is also edematous but to a lesser degree.       Assessment & Plan:  Chronic lymphedema. I called the wound care center. He has not ever had an appointment there. 1 he did have he canceled. They will call him to set up another appointment.

## 2011-04-26 LAB — CULTURE, BLOOD (ROUTINE X 2): Culture  Setup Time: 201208290848

## 2011-04-30 LAB — CULTURE, BLOOD (ROUTINE X 2)
Culture  Setup Time: 201208290848
Culture: NO GROWTH

## 2011-05-01 ENCOUNTER — Encounter: Payer: Self-pay | Admitting: Family Medicine

## 2011-05-01 NOTE — Progress Notes (Signed)
  Subjective:    Patient ID: [name], male    DOB: 1933-08-30, 77 y.o.   MRN: [id number]  HPI    Review of Systems     Objective:   Physical Exam        Assessment & Plan:  A blood culture from her recent visit to the emergency room grew staph species. Since he is essentially asymptomatic I think this is a contaminant

## 2011-05-06 ENCOUNTER — Other Ambulatory Visit (HOSPITAL_BASED_OUTPATIENT_CLINIC_OR_DEPARTMENT_OTHER): Payer: Self-pay | Admitting: Internal Medicine

## 2011-05-06 ENCOUNTER — Encounter (HOSPITAL_BASED_OUTPATIENT_CLINIC_OR_DEPARTMENT_OTHER): Payer: Medicare Other | Attending: Internal Medicine

## 2011-05-06 DIAGNOSIS — L03119 Cellulitis of unspecified part of limb: Secondary | ICD-10-CM | POA: Insufficient documentation

## 2011-05-06 DIAGNOSIS — I890 Lymphedema, not elsewhere classified: Secondary | ICD-10-CM | POA: Insufficient documentation

## 2011-05-06 DIAGNOSIS — I87319 Chronic venous hypertension (idiopathic) with ulcer of unspecified lower extremity: Secondary | ICD-10-CM | POA: Insufficient documentation

## 2011-05-06 DIAGNOSIS — N400 Enlarged prostate without lower urinary tract symptoms: Secondary | ICD-10-CM | POA: Insufficient documentation

## 2011-05-06 DIAGNOSIS — L02419 Cutaneous abscess of limb, unspecified: Secondary | ICD-10-CM | POA: Insufficient documentation

## 2011-05-06 DIAGNOSIS — L97909 Non-pressure chronic ulcer of unspecified part of unspecified lower leg with unspecified severity: Secondary | ICD-10-CM | POA: Insufficient documentation

## 2011-05-06 DIAGNOSIS — L851 Acquired keratosis [keratoderma] palmaris et plantaris: Secondary | ICD-10-CM | POA: Insufficient documentation

## 2011-05-06 DIAGNOSIS — Z79899 Other long term (current) drug therapy: Secondary | ICD-10-CM | POA: Insufficient documentation

## 2011-05-06 LAB — GLUCOSE, CAPILLARY: Glucose-Capillary: 95 mg/dL (ref 70–99)

## 2011-05-06 NOTE — Progress Notes (Unsigned)
[facility]  NAME:  [name], [name]        ACCOUNT NO.:  [id number]  MEDICAL RECORD NO.:  [id number]      DATE OF BIRTH:  Nov 08, 1933  PHYSICIAN:  Jonelle Sports. Sotirios Navarro, M.D.  VISIT DATE:  05/06/2011                                  OFFICE VISIT   HISTORY:  This is a 77 year old black male was seen for evaluation of swelling, hyperkeratosis, and draining ulceration on the left lower extremity.  The patient has a fairly complex medical history that I will talk about later, but has had surgical procedure in 1975 on his left lower extremity, which apparently left him with some degree of venous insufficiency.  He is tended to edema on that extremity since and that has reached the point of lymphedema as well.  Primarily because of his difficulty and being able to reach down to his distal lower extremity to keep it properly clean, etc., he has developed severe hyperkeratosis from about the midcalf area distalward.  Recently, he developed this cellulitis in that area and required hospitalization (that was May 2012) with antibiotic therapy, etc.  Since that time, he has never completely heals, continue to have some drainage from that distal lower extremity. He was referred here now for our further evaluation and advice.  PAST MEDICAL HISTORY:  Operations; open reduction and internal fixation of fracture of the left lower extremity in 1975.  Other hospitalizations, one as per the present illness, has had several others in conjunction with his diabetes and so forth through the years.  He has no known drug allergies.  His regular medications include: 1. Glipizide 10 mg two times daily. 2. Lasix 40 mg daily. 3. Lisinopril 20 combined with hydrochlorothiazide 12.5 once daily. 4. Metformin 1000 mg b.i.d. 5. Pravastatin 40 mg daily. 6. Terazosin 5 mg daily. 7. Senna tablets as needed.  He was recently prescribed clindamycin     for this cellulitis in his legs, but did  not get that filled     because of the cost.  PERSONAL HISTORY:  Not known in detail except say that the patient was widowed recently, sees very despondent over that, also says that he has lost his home and has several amount of his money, the basis for this is less than clear to me.  REVIEW OF SYSTEMS:  The patient has had no fever, chills, or systemic symptoms in association with his present illness.  His weight is stable at an obese level.  His vision and hearing are reasonably good for man his age.  He has no history of heart disease or any symptomatology at this point in time, no history of pneumonia, no current cough or sputum production etc.  No history of gastrointestinal blood loss.  No GI or hepatobiliary diseases, does have benign prostatic hypertrophy, but no recent infections or other problems of that nature.  Has some degree of degenerative arthritis.  He has no history of strokes or seizures.  He has type 2 diabetes and rarely checks his own blood sugars, says his control is reasonably good.  PHYSICAL EXAMINATION:  VITAL SIGNS:  Blood pressure is 222/101, pulse is 80, respirations 16, temperature 98.6. GENERAL:  He is an obese, alert, cooperative, elderly black male in no immediate distress. HEENT:  His mucous membranes are moist, slightly pale. NECK:  Reasonably supple.  His Neck veins are flat. CHEST:  Grossly clear to auscultation and percussion. HEART:  His heart size is indeterminate.  His tones are of good quality. There is no definite S4 that I can hear. ABDOMEN:  Protuberant, nontender. EXTREMITIES:  Unremarkable on the right.  The left lower extremity has considerable edema from the knee down with chronic lymphedematous appearance to it as well.  Distally, he has extreme hyperkeratosis essentially circumferentially from the high mid tibial area to the ankle.  Laterally within this, there is one area that is superficially ulcerated measuring about to 1 x 1 cm  with some slight weeping.  IMPRESSION:  Chronic stasis edema and lymphedema of the left lower extremity with hyperkeratosis and cellulitis, the later or now partially treated (it was recommend when he left the emergency room recently where he was seems that cellulitis that he take clindamycin, but he did not fill it because of the cost involved).  DISPOSITION: 1. The wound itself requires no debridement. 2. It is treated today with an application of Bag Balm, small amount     of Silvadene to the ulcer itself, and covered with foam dressing,     then placed in a Kerlix Coban wrap.  The patient was begun on Septra DS one p.o. b.i.d. and is given a prescription for 60 of these. 1. Efforts were made to obtain a Doppler study of his pulse in that     extremity, but because of extreme edema, it was unsuccessful in     doing that.  His recorded ABI on that extremity is 0.79.  The patient reports that he had vascular studies during his hospitalization in May of this year and we cannot find that in these records, we will search that with the hospital.  If so, we will not need to repeat these.  If not, certainly he needs repeat of both arterial and venous Dopplers initiated at his next visit.  Followup visit will be here in 1 week.          ______________________________ Jonelle Sports. Cheryll Cockayne, M.D.     RES/MEDQ  D:  05/06/2011  T:  05/06/2011  Job:  [id number]  cc:   Sharlot Gowda, M.D.

## 2011-05-16 ENCOUNTER — Emergency Department (HOSPITAL_COMMUNITY)
Admission: EM | Admit: 2011-05-16 | Discharge: 2011-05-16 | Discharge status: Against Medical Advice | Payer: Medicare Other | Attending: Emergency Medicine | Admitting: Emergency Medicine

## 2011-05-16 ENCOUNTER — Emergency Department (HOSPITAL_COMMUNITY)
Admission: EM | Admit: 2011-05-16 | Discharge: 2011-05-16 | Disposition: A | Discharge status: Home / Self Care | Payer: Medicare Other | Attending: Emergency Medicine | Admitting: Emergency Medicine

## 2011-05-16 DIAGNOSIS — Z79899 Other long term (current) drug therapy: Secondary | ICD-10-CM | POA: Insufficient documentation

## 2011-05-16 DIAGNOSIS — E119 Type 2 diabetes mellitus without complications: Secondary | ICD-10-CM | POA: Insufficient documentation

## 2011-05-16 DIAGNOSIS — R110 Nausea: Secondary | ICD-10-CM | POA: Insufficient documentation

## 2011-05-16 DIAGNOSIS — K219 Gastro-esophageal reflux disease without esophagitis: Secondary | ICD-10-CM | POA: Insufficient documentation

## 2011-05-16 DIAGNOSIS — M25473 Effusion, unspecified ankle: Secondary | ICD-10-CM | POA: Insufficient documentation

## 2011-05-16 DIAGNOSIS — R040 Epistaxis: Secondary | ICD-10-CM | POA: Insufficient documentation

## 2011-05-16 DIAGNOSIS — I10 Essential (primary) hypertension: Secondary | ICD-10-CM | POA: Insufficient documentation

## 2011-05-16 DIAGNOSIS — K5900 Constipation, unspecified: Secondary | ICD-10-CM | POA: Insufficient documentation

## 2011-05-16 DIAGNOSIS — R079 Chest pain, unspecified: Secondary | ICD-10-CM | POA: Insufficient documentation

## 2011-05-16 DIAGNOSIS — E7800 Pure hypercholesterolemia, unspecified: Secondary | ICD-10-CM | POA: Insufficient documentation

## 2011-05-16 DIAGNOSIS — M25476 Effusion, unspecified foot: Secondary | ICD-10-CM | POA: Insufficient documentation

## 2011-05-16 LAB — BASIC METABOLIC PANEL
BUN: 24 mg/dL — ABNORMAL HIGH (ref 6–23)
Chloride: 98 mEq/L (ref 96–112)
Creatinine, Ser: 2.06 mg/dL — ABNORMAL HIGH (ref 0.50–1.35)
GFR calc Af Amer: 38 mL/min — ABNORMAL LOW (ref >60)
GFR calc non Af Amer: 32 mL/min — ABNORMAL LOW (ref >60)
Potassium: 4.1 mEq/L (ref 3.5–5.1)

## 2011-05-16 LAB — POCT I-STAT TROPONIN I: Troponin i, poc: 0 ng/mL (ref 0.00–0.08)

## 2011-05-16 LAB — CBC
HCT: 30.4 % — ABNORMAL LOW (ref 39.0–52.0)
MCHC: 34.5 g/dL (ref 30.0–36.0)
MCV: 86.1 fL (ref 78.0–100.0)
Platelets: 154 10*3/uL (ref 150–400)
RDW: 12.9 % (ref 11.5–15.5)
WBC: 3.2 10*3/uL — ABNORMAL LOW (ref 4.0–10.5)

## 2011-05-16 LAB — POCT I-STAT, CHEM 8
Calcium, Ion: 1.22 mmol/L (ref 1.12–1.32)
HCT: 32 % — ABNORMAL LOW (ref 39.0–52.0)
Hemoglobin: 10.9 g/dL — ABNORMAL LOW (ref 13.0–17.0)
Sodium: 137 mEq/L (ref 135–145)
TCO2: 28 mmol/L (ref 0–100)

## 2011-05-16 LAB — DIFFERENTIAL
Basophils Absolute: 0 10*3/uL (ref 0.0–0.1)
Eosinophils Absolute: 0 10*3/uL (ref 0.0–0.7)
Eosinophils Relative: 0 % (ref 0–5)
Lymphocytes Relative: 30 % (ref 12–46)
Monocytes Absolute: 0.5 10*3/uL (ref 0.1–1.0)

## 2011-05-21 LAB — POCT I-STAT, CHEM 8
Calcium, Ion: 1.22
HCT: 43
Sodium: 136
TCO2: 32

## 2011-06-03 ENCOUNTER — Encounter (HOSPITAL_BASED_OUTPATIENT_CLINIC_OR_DEPARTMENT_OTHER): Payer: Medicare Other

## 2011-06-05 LAB — URINALYSIS, ROUTINE W REFLEX MICROSCOPIC
Bilirubin Urine: NEGATIVE
Glucose, UA: 100 — AB
Hgb urine dipstick: NEGATIVE
Ketones, ur: NEGATIVE
Nitrite: NEGATIVE
Protein, ur: NEGATIVE
Specific Gravity, Urine: 1.014
Urobilinogen, UA: 1
pH: 7.5

## 2011-06-05 LAB — TROPONIN I: Troponin I: 0.02

## 2011-06-05 LAB — BASIC METABOLIC PANEL WITH GFR
CO2: 31
Chloride: 102
Glucose, Bld: 148 — ABNORMAL HIGH
Potassium: 4.2
Sodium: 139

## 2011-06-05 LAB — BASIC METABOLIC PANEL
BUN: 14
Calcium: 9.3
Creatinine, Ser: 0.85
GFR calc Af Amer: >60
GFR calc non Af Amer: >60

## 2011-06-06 LAB — COMPREHENSIVE METABOLIC PANEL
AST: 16
Albumin: 4.1
Alkaline Phosphatase: 85
BUN: 14
GFR calc Af Amer: >60
Potassium: 4.5
Sodium: 136
Total Protein: 7.1

## 2011-06-06 LAB — CBC
HCT: 43.1
Platelets: 159
RDW: 13.3

## 2011-06-06 LAB — DIFFERENTIAL
Basophils Relative: 0
Eosinophils Relative: 1
Monocytes Absolute: 0.5
Monocytes Relative: 9
Neutro Abs: 3.4

## 2011-06-25 ENCOUNTER — Ambulatory Visit (INDEPENDENT_AMBULATORY_CARE_PROVIDER_SITE_OTHER): Payer: Medicare Other | Admitting: Family Medicine

## 2011-06-25 ENCOUNTER — Encounter: Payer: Self-pay | Admitting: Family Medicine

## 2011-06-25 DIAGNOSIS — I890 Lymphedema, not elsewhere classified: Secondary | ICD-10-CM

## 2011-06-25 DIAGNOSIS — E669 Obesity, unspecified: Secondary | ICD-10-CM

## 2011-06-25 DIAGNOSIS — Z9119 Patient's noncompliance with other medical treatment and regimen: Secondary | ICD-10-CM

## 2011-06-25 DIAGNOSIS — E119 Type 2 diabetes mellitus without complications: Secondary | ICD-10-CM

## 2011-06-25 DIAGNOSIS — I10 Essential (primary) hypertension: Secondary | ICD-10-CM

## 2011-06-25 DIAGNOSIS — Z23 Encounter for immunization: Secondary | ICD-10-CM

## 2011-06-25 NOTE — Patient Instructions (Signed)
I will call home health agency and have them see if they can come by and help you with your feet.

## 2011-06-25 NOTE — Progress Notes (Signed)
  Subjective:    Patient ID: [name], male    DOB: 03/05/1934, 77 y.o.   MRN: [id number]  HPI He is here for recheck. He continues on medications listed in the chart. He was followed at the wound care center however this stopped approximately one month ago. He has been unable to followup with their recommendations stating he cannot reach his feet. He rarely checks his blood sugars and his feet keep him from exercising. He again is very difficult to get a good history from. He has no other concerns or complaints. He does not smoke or drink.  Review of Systems     Objective:   Physical Exam Alert and in no distress. Both lower extremities are edematous, left greater than right. They do appear better than on his last visit.       Assessment & Plan:   1. Diabetes mellitus  POCT HgB A1C  2. Hypertension    3. Obesity    4. Chronic acquired lymphedema    5. Personal history of noncompliance with medical treatment, presenting hazards to health    6. Need for prophylactic vaccination and inoculation against smallpox  Flu vaccine greater than or equal to 3yo preservative free IM   I will see if I can get home health agency to come by and help with his lower extremity care. Followup here in about 3 months.

## 2011-07-11 ENCOUNTER — Telehealth: Payer: Self-pay | Admitting: Internal Medicine

## 2011-07-11 NOTE — Telephone Encounter (Signed)
Amy a nurse from [facility] was out with the pt today and the wounds have healed but his blood pressure is very high. Today November 15 bp was 200/100. Nov 8 bp was 150/90 and nov 1 bp was 146/96. shes not sure if he is taking his meds like he is suppose to but the bottles she said was getting empty. She just wanted to check up and let the doctor know. Amy's number is [phone].

## 2011-07-16 NOTE — Telephone Encounter (Signed)
Just a reminder I sent you something on Nettie from [facility] on 11/15

## 2011-10-01 ENCOUNTER — Encounter: Payer: Self-pay | Admitting: Family Medicine

## 2011-10-01 ENCOUNTER — Ambulatory Visit (INDEPENDENT_AMBULATORY_CARE_PROVIDER_SITE_OTHER): Payer: Medicare Other | Admitting: Family Medicine

## 2011-10-01 DIAGNOSIS — E669 Obesity, unspecified: Secondary | ICD-10-CM

## 2011-10-01 DIAGNOSIS — E785 Hyperlipidemia, unspecified: Secondary | ICD-10-CM

## 2011-10-01 DIAGNOSIS — E119 Type 2 diabetes mellitus without complications: Secondary | ICD-10-CM

## 2011-10-01 DIAGNOSIS — Z9119 Patient's noncompliance with other medical treatment and regimen: Secondary | ICD-10-CM

## 2011-10-01 DIAGNOSIS — I890 Lymphedema, not elsewhere classified: Secondary | ICD-10-CM

## 2011-10-01 LAB — LIPID PANEL
Cholesterol: 134 mg/dL (ref 0–200)
Total CHOL/HDL Ratio: 4.5 Ratio
Triglycerides: 129 mg/dL (ref <150)
VLDL: 26 mg/dL (ref 0–40)

## 2011-10-01 NOTE — Patient Instructions (Signed)
We will have a care management team, and evaluate you and your living conditions.

## 2011-10-01 NOTE — Progress Notes (Signed)
  Subjective:    Patient ID: [name], male    DOB: [birth year], 78 y.o.   MRN: [id number]  HPI He is here for recheck. He did not bring his medications with him. He is a very poor historian. He did get help with his chronic edema however after several weeks it stopped and he did not followup appropriately. He complains of fatigue. His eating habits are quite poor and he admits to eating a lot of fast foods. He also states that he is having a hard time taking care of himself especially with bathing since he cannot get in and out of the bathroom that he is in or the tub.   Review of Systems     Objective:   Physical Exam Alert and in no distress. Both lower families are quite edematous chronic skin changes. Hemoglobin A1c is 6.8.      Assessment & Plan:   1. Diabetes mellitus  POCT HgB A1C, Lipid panel  2. Chronic acquired lymphedema    3. Hyperlipemia  Lipid panel  4. Obesity    5. Personal history of noncompliance with medical treatment, presenting hazards to health     I'll get [facility] care management team to do a home assessment to see what his needs are and wait for their assessment. In the meantime he will continue on his present medications.

## 2011-10-02 NOTE — Progress Notes (Signed)
Quick Note:  The blood work is normal ______ 

## 2011-11-11 ENCOUNTER — Ambulatory Visit (INDEPENDENT_AMBULATORY_CARE_PROVIDER_SITE_OTHER): Payer: Medicare Other | Admitting: Family Medicine

## 2011-11-11 ENCOUNTER — Encounter: Payer: Self-pay | Admitting: Family Medicine

## 2011-11-11 VITALS — BP 132/82 | HR 70 | Wt 266.0 lb

## 2011-11-11 DIAGNOSIS — E785 Hyperlipidemia, unspecified: Secondary | ICD-10-CM

## 2011-11-11 DIAGNOSIS — I890 Lymphedema, not elsewhere classified: Secondary | ICD-10-CM

## 2011-11-11 DIAGNOSIS — E119 Type 2 diabetes mellitus without complications: Secondary | ICD-10-CM

## 2011-11-11 DIAGNOSIS — I10 Essential (primary) hypertension: Secondary | ICD-10-CM

## 2011-11-11 NOTE — Progress Notes (Signed)
  Subjective:    Patient ID: [name], male    DOB: 1934-05-04, 78 y.o.   MRN: [id number]  HPI He is here for consultation. He is accompanied by Dianna Limbo who was the care coordinator for [facility]. He does not have a glucometer. Prescription was written for the test strips. The glucometer was given by the nurse. We discussed his care in detail. She will help arrange to help with his bathing. Also social worker will be talking to him concerning his overall health and well-being. He seems to have definitely gone downhill since his wife died several years ago. We will also arrange for Meals on Wheels with him. He continues to have difficulty with his overall health and well-being. We will also arrange for [facility] to  come by to work on the chronic lymphedema. What I would like to do is to use compression dressings on this to help get rid of the edema and eventually get him on TED stockings.   Review of Systems     Objective:   Physical Exam Alert and in no distress the       Assessment & Plan:   1. Chronic acquired lymphedema   2. Diabetes mellitus   3. Hypertension   4. Hyperlipidemia    plan as outlined above. Followup here in several months.

## 2011-11-19 ENCOUNTER — Telehealth: Payer: Self-pay | Admitting: Internal Medicine

## 2011-11-19 NOTE — Telephone Encounter (Signed)
If you have any questions you may contact bryant or leave a message with the clinical Engineer, manufacturing

## 2011-11-19 NOTE — Telephone Encounter (Signed)
Spoke with [name] to let her know RX was written

## 2011-11-19 NOTE — Telephone Encounter (Signed)
Ask to speak to shelley the RN [phone]

## 2011-11-19 NOTE — Telephone Encounter (Signed)
Prescription written

## 2011-12-11 ENCOUNTER — Ambulatory Visit (INDEPENDENT_AMBULATORY_CARE_PROVIDER_SITE_OTHER): Payer: Medicare Other | Admitting: Family Medicine

## 2011-12-11 ENCOUNTER — Encounter: Payer: Self-pay | Admitting: Family Medicine

## 2011-12-11 VITALS — BP 134/70 | HR 94 | Wt 251.0 lb

## 2011-12-11 DIAGNOSIS — E119 Type 2 diabetes mellitus without complications: Secondary | ICD-10-CM

## 2011-12-11 DIAGNOSIS — I890 Lymphedema, not elsewhere classified: Secondary | ICD-10-CM

## 2011-12-11 DIAGNOSIS — Z9119 Patient's noncompliance with other medical treatment and regimen: Secondary | ICD-10-CM

## 2011-12-11 NOTE — Progress Notes (Signed)
  Subjective:    Patient ID: [name], male    DOB: [birth year], 78 y.o.   MRN: [id number]  HPI Is here for recheck. His leg has been wrapped regularly since last being seen. Review of the record indicates he has lost approximately 15 pounds during the same timeframe. He still does complain of some decreased strength and stamina as well as some foot discomfort. Again very difficult to get a good history from him.   Review of Systems     Objective:   Physical Exam Exam of both lower extremities shows both of them to rewrap however palpation does show greatly diminished edema with no pitting. He does have very long thickened toenails.       Assessment & Plan:   1. Personal history of noncompliance with medical treatment, presenting hazards to health    2. Diabetes mellitus  Ambulatory referral to Podiatry  3. Chronic acquired lymphedema     he is to continue with his diet and exercise as well as medications. Strongly encouraged him to get more physically active.

## 2011-12-11 NOTE — Patient Instructions (Signed)
Walk 20 to 30 minutes every day.

## 2011-12-13 ENCOUNTER — Telehealth: Payer: Self-pay | Admitting: Family Medicine

## 2011-12-13 NOTE — Telephone Encounter (Signed)
LM

## 2012-01-07 ENCOUNTER — Telehealth: Payer: Self-pay

## 2012-01-07 NOTE — Telephone Encounter (Signed)
Scott called to let you know he is not his regular nurse he went to change bandage today pt refused to have wraped again till Friday when the other nurse gets back he said no swelling above or below bandage no bleed thru so he left him alone just wanted you t know

## 2012-01-14 ENCOUNTER — Other Ambulatory Visit: Payer: Self-pay

## 2012-01-14 ENCOUNTER — Encounter: Payer: Self-pay | Admitting: Family Medicine

## 2012-01-14 ENCOUNTER — Ambulatory Visit (INDEPENDENT_AMBULATORY_CARE_PROVIDER_SITE_OTHER): Payer: Medicare Other | Admitting: Family Medicine

## 2012-01-14 VITALS — BP 140/80 | HR 88 | Wt 254.0 lb

## 2012-01-14 DIAGNOSIS — M791 Myalgia, unspecified site: Secondary | ICD-10-CM

## 2012-01-14 DIAGNOSIS — IMO0001 Reserved for inherently not codable concepts without codable children: Secondary | ICD-10-CM

## 2012-01-14 MED ORDER — FAMOTIDINE 20 MG PO TABS: 20.0000 mg | ORAL_TABLET | Freq: Two times a day (BID) | ORAL | Status: DC

## 2012-01-14 NOTE — Progress Notes (Signed)
  Subjective:    Patient ID: [name], male    DOB: 1934-06-02, 78 y.o.   MRN: [id number]  HPI He is here today as a mistake. He thought that he had a Tuesday appointment but it is in one month. He does state that he has had bilateral arm and upper back pain. No history of injury, shortness of breath, weakness numbness or tingling.   Review of Systems     Objective:   Physical Exam Alert and in no distress. No tenderness to palpation of the arms. Lungs are clear to auscultation.       Assessment & Plan:   1. Myalgia    recommend Tylenol for the aches and pains.

## 2012-01-14 NOTE — Telephone Encounter (Signed)
ORDER MED FOR PT

## 2012-01-28 ENCOUNTER — Encounter: Payer: Self-pay | Admitting: Family Medicine

## 2012-01-28 ENCOUNTER — Ambulatory Visit (INDEPENDENT_AMBULATORY_CARE_PROVIDER_SITE_OTHER): Payer: Medicare Other | Admitting: Family Medicine

## 2012-01-28 VITALS — BP 138/76 | HR 76 | Wt 248.0 lb

## 2012-01-28 DIAGNOSIS — E119 Type 2 diabetes mellitus without complications: Secondary | ICD-10-CM

## 2012-01-28 DIAGNOSIS — Z79899 Other long term (current) drug therapy: Secondary | ICD-10-CM

## 2012-01-28 DIAGNOSIS — E669 Obesity, unspecified: Secondary | ICD-10-CM

## 2012-01-28 DIAGNOSIS — I10 Essential (primary) hypertension: Secondary | ICD-10-CM

## 2012-01-28 DIAGNOSIS — I890 Lymphedema, not elsewhere classified: Secondary | ICD-10-CM

## 2012-01-28 DIAGNOSIS — E785 Hyperlipidemia, unspecified: Secondary | ICD-10-CM

## 2012-01-28 NOTE — Progress Notes (Signed)
  Subjective:    Patient ID: [name], male    DOB: May 21, 1934, 78 y.o.   MRN: [id number]  HPI He is here for recheck. He continues on medications listed in the chart. He does have home health agencies coming by specifically to help with his chronic lymphedema. His legs are doing much better. He has lost some weight due to this. He is still relatively sedentary. He does not smoke or drink. He notes he spends his time talking about his finances and problems he is having with his daughter. He apparently does have a Child psychotherapist helping him. He complains of difficulty with his finances but again it is very difficult to get a good history from him. He is not complaining of chest pain, shortness of breath. He does state that he has a very poor appetite. His weight is down slightly.   Review of Systems     Objective:   Physical Exam Alert and in no distress. Both legs are wrapped but are definitely much smaller than in the past. There is slight edema in the distal areas of both feet where the wrapping is not present. Hemoglobin A1c was attempted however not enough blood was drawn. He left before in the blood work to be drawn.       Assessment & Plan:   1. Diabetes mellitus    2. Hypertension  CBC with Differential, Comprehensive metabolic panel  3. Hyperlipemia    4. Obesity    5. Chronic acquired lymphedema    6. Encounter for long-term (current) use of other medications  CBC with Differential, Comprehensive metabolic panel  Continue on your present medications. If you're having some financial issues, discuss this with your caseworker. Keep working to keep your legs and good shape.

## 2012-01-28 NOTE — Patient Instructions (Addendum)
Continue on your present medications. If you're having some financial issues, discuss this with your caseworker. Keep working to keep your legs and good shape.

## 2012-02-18 ENCOUNTER — Telehealth: Payer: Self-pay | Admitting: Family Medicine

## 2012-02-18 NOTE — Telephone Encounter (Signed)
CASE WORKER MARY STEPHENS [phone] STATES THEY ARE NEEDING DR'S INPUT & OPINION ON PT'S COGNITIVE IMPAIRMENT.  STS PT DOESN'T SEEM TO UNDERSTAND WHAT IS GOING ON. STS PT HAS GOOD INCOME (RETIRED FROM [employer]) YET BILLS NOT BEING PAID.  STS HE PAYS HIS RENT & HIS DAUGHTER'S RENT, DOESN'T KNOW WHY.  WANTS TO MAKE SURE NO ONE IS TAKING PT'S MONEY.  STATES PT WAS HOMELESS FOR 2 YEARS & WHILE HAVING THIS INCOME.  PT NOW LIVES IN AN APARTMENT THAT MUST DIRECT DRAFT HIS RENT.  SHE IS TRYING TO GET HIM OUT OF THAT APT  BECAUSE IT'S NOT GOOD NEIGHBORHOOD. STATES DSS CAN BE PT'S PAYEE & TAKE CARE OF HIS BILLS.

## 2012-02-20 ENCOUNTER — Other Ambulatory Visit: Payer: Self-pay | Admitting: Family Medicine

## 2012-02-25 ENCOUNTER — Encounter: Payer: Self-pay | Admitting: Family Medicine

## 2012-02-25 ENCOUNTER — Ambulatory Visit (INDEPENDENT_AMBULATORY_CARE_PROVIDER_SITE_OTHER): Payer: Medicare Other | Admitting: Family Medicine

## 2012-02-25 VITALS — BP 150/80 | HR 86 | Wt 243.0 lb

## 2012-02-25 DIAGNOSIS — I890 Lymphedema, not elsewhere classified: Secondary | ICD-10-CM

## 2012-02-25 DIAGNOSIS — Z9119 Patient's noncompliance with other medical treatment and regimen: Secondary | ICD-10-CM

## 2012-02-25 DIAGNOSIS — Z23 Encounter for immunization: Secondary | ICD-10-CM

## 2012-02-25 DIAGNOSIS — I10 Essential (primary) hypertension: Secondary | ICD-10-CM

## 2012-02-25 NOTE — Progress Notes (Signed)
  Subjective:    Patient ID: [name], male    DOB: Aug 31, 1933, 78 y.o.   MRN: [id number]  HPI He is here for a blood pressure check. Apparently it has been elevated at home. He states he did not take his blood pressure medications this morning. He is also here to discuss his finances. When asked questions about his finances he realizes that he is paying for his daughter's friend but when asking why he is doing it he cannot give me a good answer. He also apparently has not been paying his other bills on time and again when asking about that he was unaware that that was occurring. He he is amenable to getting help with his finances and to stopping his daughter's bills. He also admits to gambling away some of his body. Apparently he had to sell his truck because of inability to pay for the repairs. He also states his legs are starting to swell up again. He is no longer being cared for by [place].   Review of Systems     Objective:   Physical Exam Alert and in no distress. Oriented x3. Blood pressure is recorded. Both lower extremities do show edema present on the mid calf on down.      Assessment & Plan:   1. Hypertension   2. Chronic acquired lymphedema   3. Personal history of noncompliance with medical treatment, presenting hazards to health    it is quite obvious that he is having difficulty with his ADL's. We will attempt to get him help with this through [facility]. Pneumovax also given.

## 2012-02-25 NOTE — Patient Instructions (Signed)
Keep your feet elevated. Use support  Hose.

## 2012-03-11 ENCOUNTER — Ambulatory Visit: Payer: Medicare Other | Admitting: Family Medicine

## 2012-03-30 ENCOUNTER — Other Ambulatory Visit: Payer: Self-pay

## 2012-03-30 MED ORDER — LISINOPRIL-HYDROCHLOROTHIAZIDE 20-12.5 MG PO TABS: 1.0000 | ORAL_TABLET | Freq: Every day | ORAL | Status: DC

## 2012-03-30 MED ORDER — TERAZOSIN HCL 5 MG PO CAPS: 5.0000 mg | ORAL_CAPSULE | Freq: Every day | ORAL | Status: DC

## 2012-03-30 MED ORDER — GLIPIZIDE 10 MG PO TABS: 10.0000 mg | ORAL_TABLET | Freq: Two times a day (BID) | ORAL | Status: DC

## 2012-03-30 MED ORDER — PRAVASTATIN SODIUM 40 MG PO TABS: 40.0000 mg | ORAL_TABLET | Freq: Every evening | ORAL | Status: DC

## 2012-03-30 MED ORDER — FUROSEMIDE 40 MG PO TABS: 40.0000 mg | ORAL_TABLET | Freq: Every day | ORAL | Status: DC

## 2012-03-30 NOTE — Telephone Encounter (Signed)
Sent meds in per jcl 

## 2012-03-30 NOTE — Telephone Encounter (Signed)
[name] from [facility] called said [name] needed these med's called in ASAP Glipizide,Lasix,Terazosin,lisino/hctz, and pravastatin is he still taking these or has he been taken off of them

## 2012-03-30 NOTE — Telephone Encounter (Signed)
Yes he is on them. I don't know why the medication list makes it look like he has been taken off of them

## 2012-04-10 ENCOUNTER — Ambulatory Visit (INDEPENDENT_AMBULATORY_CARE_PROVIDER_SITE_OTHER): Payer: Medicare Other | Admitting: Medical

## 2012-04-10 ENCOUNTER — Encounter: Payer: Self-pay | Admitting: Medical

## 2012-04-10 VITALS — BP 170/80 | HR 100 | Temp 98.3°F | Resp 18 | Wt 249.5 lb

## 2012-04-10 DIAGNOSIS — Z7409 Other reduced mobility: Secondary | ICD-10-CM

## 2012-04-10 DIAGNOSIS — Z9119 Patient's noncompliance with other medical treatment and regimen: Secondary | ICD-10-CM

## 2012-04-10 DIAGNOSIS — F329 Major depressive disorder, single episode, unspecified: Secondary | ICD-10-CM

## 2012-04-10 DIAGNOSIS — I890 Lymphedema, not elsewhere classified: Secondary | ICD-10-CM

## 2012-04-10 DIAGNOSIS — R29898 Other symptoms and signs involving the musculoskeletal system: Secondary | ICD-10-CM

## 2012-04-10 MED ORDER — PAROXETINE HCL 20 MG PO TABS: 20.0000 mg | ORAL_TABLET | ORAL | Status: DC

## 2012-04-10 NOTE — Patient Instructions (Signed)
I am starting you on Paxil to help with depression.  Begin 1 tablet daily.  I want you to return and recheck with Dr. Susann Givens in 2 weeks on this medication.  We will call again to [facility] to see if we can get someone out to start wrapping the left leg again.    For now, elevated your legs to help with swelling.   I spoke to Mrs. Rivers, your Sports coach.  She will be coming out next Friday again to check on things.   If you are having increasing difficulty taking care of your self at home, I would consider the benefits of Assisted Living to help provide you a safe living environment where you can get some assistance.

## 2012-04-10 NOTE — Progress Notes (Signed)
Subjective: Here for multiple c/o.  This is my first visit with him today. He normally sees Dr. Susann Givens here.  He has multiple chronic medical problems, and apparently has some cognitive impairment or mild dementia.  He has hx/o chronic acquired lymphedema of left leg and chronic venous insufficiency of right leg.  He lives alone in a 1 bedroom apartment.  He is mainly here for his leg swelling.  He was having home health coming out and they were doing compression wraps of the left leg which was making a huge difference with his lymphedema.  He notes they quit coming out recently as things got better, but now they are worse again.    He is upset today.  He says he retired from [employer], had a house, but says he current lives in a 1 bedroom apartment.  He says he should be in the house he paid for and worked for all those years.  He says his 2 daughters, brother and sister took some of his money, just want his money, but don't want to have anything to do with him.  He is depressed.  He is a widow.  He notes that he had bought a new motorcycle after his wife diet, but after riding it once and laying it down, he gave that up.   He says he has gone down hill since.  He has trouble with ADLs.  He notes that lately he isn't eating good, not able to cook for himself.  He hasn't been able to wash his clothes lately either.  All his clothes are currently dirty.  He notes that he had to air dry from the tub the other day as he couldn't dry off.  He is unhappy.   He lately doesn't always take his medication like he is suppose to.  He can't tell me which medications he is taking and which he is not. He states that he wants to live independently, and if he could get his money straightened out and clothes washed, he would be fine.  No other c/o.  Past Medical History  Diagnosis Date  . Smoker   . Narcolepsy   . Hypertension   . BPH (benign prostatic hyperplasia)   . Arthritis   . Diabetes mellitus   . Neuropathy  in diabetes   . Herpes   . Dyslipidemia   . Obesity     ROS as noted above in HPI  Objective:   Physical Exam  Filed Vitals:   04/10/12 1337  BP: 170/80  Pulse: 100  Temp: 98.3 F (36.8 C)  Resp: 18    General appearance: alert, no distress, WD/WN, AA male, seated with cane Psych: seems down and depressed, frustrated Neck: supple, no lymphadenopathy, no thyromegaly, no masses Heart: RRR, normal S1, S2, no murmurs Lungs: CTA bilaterally, no wheezes, rhonchi, or rales Pulses: 1-2 + UE and LE Exam: left lower leg with generalized swelling including foot c/w lymphedema, brawny coloration of the leg, some rough lichenified skin.  No signs of cellulitis or ulceration.  Right leg with compression hose and no edema MSK: nontender legs and feet  Assessment and Plan :    Encounter Diagnoses  Name Primary?  . Depression Yes  . Chronic acquired lymphedema   . Impaired mobility and ADLs   . Noncompliance    After reviewing his chart, he has apparently had a case manager involved from [facility].   i called and spoke to his case manager who was  out at his house today.  He apparently has issues with confusion and some dementia.   Although he stated today that wife passed in 08-12-2023, she apparently passed away a few years ago.   He is currently non compliant with some of his medications.   He was improved with PT home health using compression wraps for his lymphedema, but not has worsened again which is bothering him.   He has trouble with ADLs and seems depressed.  We discussed beginning medication for mood and he will begin Paxil.  Discussed risk/benefits and begin 1/2 tablet daily x 1wk.  We will call to have home health restart compression wraps for the lymphedema.  i asked case manager to go back out next week and discuss again the options of Assisted Living or other options that would give him some independence but also some ADL assistance or supervision.  I asked him to return  in 2 wk with Dr. Susann Givens his [facility] for recheck.

## 2012-04-11 ENCOUNTER — Encounter: Payer: Self-pay | Admitting: Medical

## 2012-05-28 ENCOUNTER — Encounter: Payer: Self-pay | Admitting: Family Medicine

## 2012-05-28 ENCOUNTER — Ambulatory Visit (INDEPENDENT_AMBULATORY_CARE_PROVIDER_SITE_OTHER): Payer: Medicare Other | Admitting: Family Medicine

## 2012-05-28 VITALS — BP 140/70 | HR 87 | Wt 245.0 lb

## 2012-05-28 DIAGNOSIS — E119 Type 2 diabetes mellitus without complications: Secondary | ICD-10-CM

## 2012-05-28 DIAGNOSIS — Z79899 Other long term (current) drug therapy: Secondary | ICD-10-CM

## 2012-05-28 DIAGNOSIS — I10 Essential (primary) hypertension: Secondary | ICD-10-CM

## 2012-05-28 DIAGNOSIS — E785 Hyperlipidemia, unspecified: Secondary | ICD-10-CM

## 2012-05-28 DIAGNOSIS — I890 Lymphedema, not elsewhere classified: Secondary | ICD-10-CM

## 2012-05-28 DIAGNOSIS — E669 Obesity, unspecified: Secondary | ICD-10-CM

## 2012-05-28 DIAGNOSIS — Z91199 Patient's noncompliance with other medical treatment and regimen due to unspecified reason: Secondary | ICD-10-CM

## 2012-05-28 DIAGNOSIS — Z9119 Patient's noncompliance with other medical treatment and regimen: Secondary | ICD-10-CM

## 2012-05-28 NOTE — Progress Notes (Signed)
  Subjective:    Patient ID: [name], male    DOB: December 11, 1933, 78 y.o.   MRN: [id number]  HPI He is here for a recheck. He again is difficult to get a good history from. He has not been keeping his legs elevated. He continues to talk about his daughter but refuses to do anything about it. He continues on medications listed in the chart and there is questionable compliance. I did discuss his care with [facility]. She has stopped care for him citing the fact that he refuses to do anything at all to help his situation. In fact he has fired several people and they brought in and help him with various functions.   Review of Systems     Objective:   Physical Exam Alert and in no distress otherwise not examined       Assessment & Plan:   1. Diabetes mellitus   2. Chronic acquired lymphedema   3. Hyperlipemia   4. Hypertension   5. Obesity   6. Personal history of noncompliance with medical treatment, presenting hazards to health   7. Encounter for long-term (current) use of other medications    we attempted to draw blood for routine purposes as well as check his hemoglobin A1c. He has refused having in this drawn. At this time I do not think that there is much we can do to help him since he is unwilling to help himself

## 2012-06-18 ENCOUNTER — Emergency Department (HOSPITAL_COMMUNITY): Payer: Medicare Other

## 2012-06-18 ENCOUNTER — Encounter (HOSPITAL_COMMUNITY): Payer: Self-pay | Admitting: *Deleted

## 2012-06-18 ENCOUNTER — Inpatient Hospital Stay (HOSPITAL_COMMUNITY)
Admission: EM | Admit: 2012-06-18 | Discharge: 2012-06-26 | DRG: 377 | Disposition: A | Discharge status: Skilled Nursing Facility | Payer: Medicare Other | Attending: Family Medicine | Admitting: Family Medicine

## 2012-06-18 DIAGNOSIS — I890 Lymphedema, not elsewhere classified: Secondary | ICD-10-CM

## 2012-06-18 DIAGNOSIS — E669 Obesity, unspecified: Secondary | ICD-10-CM

## 2012-06-18 DIAGNOSIS — G47419 Narcolepsy without cataplexy: Secondary | ICD-10-CM | POA: Diagnosis present

## 2012-06-18 DIAGNOSIS — E872 Acidosis, unspecified: Secondary | ICD-10-CM | POA: Diagnosis present

## 2012-06-18 DIAGNOSIS — Z23 Encounter for immunization: Secondary | ICD-10-CM

## 2012-06-18 DIAGNOSIS — K620 Anal polyp: Secondary | ICD-10-CM | POA: Diagnosis present

## 2012-06-18 DIAGNOSIS — Z6836 Body mass index (BMI) 36.0-36.9, adult: Secondary | ICD-10-CM

## 2012-06-18 DIAGNOSIS — Y92009 Unspecified place in unspecified non-institutional (private) residence as the place of occurrence of the external cause: Secondary | ICD-10-CM

## 2012-06-18 DIAGNOSIS — I10 Essential (primary) hypertension: Secondary | ICD-10-CM

## 2012-06-18 DIAGNOSIS — I5020 Unspecified systolic (congestive) heart failure: Secondary | ICD-10-CM | POA: Diagnosis present

## 2012-06-18 DIAGNOSIS — F3289 Other specified depressive episodes: Secondary | ICD-10-CM | POA: Diagnosis present

## 2012-06-18 DIAGNOSIS — Z79899 Other long term (current) drug therapy: Secondary | ICD-10-CM

## 2012-06-18 DIAGNOSIS — K2960 Other gastritis without bleeding: Secondary | ICD-10-CM | POA: Diagnosis present

## 2012-06-18 DIAGNOSIS — E119 Type 2 diabetes mellitus without complications: Secondary | ICD-10-CM

## 2012-06-18 DIAGNOSIS — I129 Hypertensive chronic kidney disease with stage 1 through stage 4 chronic kidney disease, or unspecified chronic kidney disease: Secondary | ICD-10-CM | POA: Diagnosis present

## 2012-06-18 DIAGNOSIS — I2489 Other forms of acute ischemic heart disease: Secondary | ICD-10-CM | POA: Diagnosis present

## 2012-06-18 DIAGNOSIS — N179 Acute kidney failure, unspecified: Secondary | ICD-10-CM | POA: Diagnosis present

## 2012-06-18 DIAGNOSIS — K208 Other esophagitis without bleeding: Secondary | ICD-10-CM | POA: Diagnosis present

## 2012-06-18 DIAGNOSIS — D500 Iron deficiency anemia secondary to blood loss (chronic): Secondary | ICD-10-CM

## 2012-06-18 DIAGNOSIS — F329 Major depressive disorder, single episode, unspecified: Secondary | ICD-10-CM | POA: Diagnosis present

## 2012-06-18 DIAGNOSIS — D62 Acute posthemorrhagic anemia: Secondary | ICD-10-CM | POA: Diagnosis present

## 2012-06-18 DIAGNOSIS — I248 Other forms of acute ischemic heart disease: Secondary | ICD-10-CM | POA: Diagnosis present

## 2012-06-18 DIAGNOSIS — E1149 Type 2 diabetes mellitus with other diabetic neurological complication: Secondary | ICD-10-CM | POA: Diagnosis present

## 2012-06-18 DIAGNOSIS — K635 Polyp of colon: Secondary | ICD-10-CM

## 2012-06-18 DIAGNOSIS — N400 Enlarged prostate without lower urinary tract symptoms: Secondary | ICD-10-CM | POA: Diagnosis present

## 2012-06-18 DIAGNOSIS — E785 Hyperlipidemia, unspecified: Secondary | ICD-10-CM

## 2012-06-18 DIAGNOSIS — D72829 Elevated white blood cell count, unspecified: Secondary | ICD-10-CM | POA: Diagnosis present

## 2012-06-18 DIAGNOSIS — N189 Chronic kidney disease, unspecified: Secondary | ICD-10-CM | POA: Diagnosis present

## 2012-06-18 DIAGNOSIS — I509 Heart failure, unspecified: Secondary | ICD-10-CM | POA: Diagnosis present

## 2012-06-18 DIAGNOSIS — K7200 Acute and subacute hepatic failure without coma: Secondary | ICD-10-CM | POA: Diagnosis present

## 2012-06-18 DIAGNOSIS — K922 Gastrointestinal hemorrhage, unspecified: Principal | ICD-10-CM | POA: Diagnosis present

## 2012-06-18 DIAGNOSIS — E1142 Type 2 diabetes mellitus with diabetic polyneuropathy: Secondary | ICD-10-CM | POA: Diagnosis present

## 2012-06-18 DIAGNOSIS — Z87891 Personal history of nicotine dependence: Secondary | ICD-10-CM

## 2012-06-18 DIAGNOSIS — D126 Benign neoplasm of colon, unspecified: Secondary | ICD-10-CM | POA: Diagnosis present

## 2012-06-18 DIAGNOSIS — Z9119 Patient's noncompliance with other medical treatment and regimen: Secondary | ICD-10-CM

## 2012-06-18 DIAGNOSIS — R578 Other shock: Secondary | ICD-10-CM | POA: Diagnosis present

## 2012-06-18 DIAGNOSIS — I4891 Unspecified atrial fibrillation: Secondary | ICD-10-CM | POA: Diagnosis present

## 2012-06-18 DIAGNOSIS — Z01810 Encounter for preprocedural cardiovascular examination: Secondary | ICD-10-CM

## 2012-06-18 LAB — COMPREHENSIVE METABOLIC PANEL
AST: 657 U/L — ABNORMAL HIGH (ref 0–37)
Albumin: 3.6 g/dL (ref 3.5–5.2)
Alkaline Phosphatase: 58 U/L (ref 39–117)
CO2: 15 mEq/L — ABNORMAL LOW (ref 19–32)
Chloride: 99 mEq/L (ref 96–112)
GFR calc non Af Amer: 29 mL/min — ABNORMAL LOW (ref >90)
Potassium: 4.7 mEq/L (ref 3.5–5.1)
Total Bilirubin: 0.6 mg/dL (ref 0.3–1.2)

## 2012-06-18 LAB — LACTIC ACID, PLASMA: Lactic Acid, Venous: 5.5 mmol/L — ABNORMAL HIGH (ref 0.5–2.2)

## 2012-06-18 LAB — CBC
HCT: 13.3 % — ABNORMAL LOW (ref 39.0–52.0)
MCV: 69.3 fL — ABNORMAL LOW (ref 78.0–100.0)
Platelets: 35 10*3/uL — ABNORMAL LOW (ref 150–400)
RBC: 1.92 MIL/uL — ABNORMAL LOW (ref 4.22–5.81)
RDW: 25.3 % — ABNORMAL HIGH (ref 11.5–15.5)
WBC: 4.9 10*3/uL (ref 4.0–10.5)

## 2012-06-18 LAB — PROTIME-INR: INR: 1.73 — ABNORMAL HIGH (ref 0.00–1.49)

## 2012-06-18 LAB — ABO/RH: ABO/RH(D): O POS

## 2012-06-18 LAB — MRSA PCR SCREENING: MRSA by PCR: NEGATIVE

## 2012-06-18 LAB — FIBRINOGEN
Fibrinogen: 249 mg/dL (ref 204–475)
Fibrinogen: 256 mg/dL (ref 204–475)

## 2012-06-18 LAB — BILIRUBIN, FRACTIONATED(TOT/DIR/INDIR)
Bilirubin, Direct: 0.2 mg/dL (ref 0.0–0.3)
Indirect Bilirubin: 0.7 mg/dL (ref 0.3–0.9)

## 2012-06-18 LAB — TECHNOLOGIST SMEAR REVIEW

## 2012-06-18 LAB — TROPONIN I
Troponin I: 0.64 ng/mL (ref <0.30)
Troponin I: 0.58 ng/mL (ref <0.30)

## 2012-06-18 LAB — APTT: aPTT: 37 seconds (ref 24–37)

## 2012-06-18 LAB — CK TOTAL AND CKMB (NOT AT ARMC): Total CK: 181 U/L (ref 7–232)

## 2012-06-18 LAB — GLUCOSE, CAPILLARY: Glucose-Capillary: 171 mg/dL — ABNORMAL HIGH (ref 70–99)

## 2012-06-18 LAB — LACTATE DEHYDROGENASE: LDH: 1406 U/L — ABNORMAL HIGH (ref 94–250)

## 2012-06-18 MED ORDER — ONDANSETRON HCL 4 MG/2ML IJ SOLN
INTRAMUSCULAR | Status: AC
  Administered 2012-06-18: 4 mg via INTRAVENOUS
  Filled 2012-06-18: qty 2

## 2012-06-18 MED ORDER — SODIUM CHLORIDE 0.9 % IV SOLN
8.0000 mg/h | INTRAVENOUS | Status: DC
  Administered 2012-06-18: 8 mg/h via INTRAVENOUS
  Filled 2012-06-18 (×8): qty 80

## 2012-06-18 MED ORDER — VITAMIN K1 10 MG/ML IJ SOLN
10.0000 mg | Freq: Once | INTRAVENOUS | Status: AC
  Administered 2012-06-18: 10 mg via INTRAVENOUS
  Filled 2012-06-18: qty 1

## 2012-06-18 MED ORDER — PANTOPRAZOLE SODIUM 40 MG IV SOLR
40.0000 mg | Freq: Once | INTRAVENOUS | Status: AC
  Administered 2012-06-18: 40 mg via INTRAVENOUS
  Filled 2012-06-18: qty 40

## 2012-06-18 MED ORDER — ONDANSETRON HCL 4 MG/2ML IJ SOLN
4.0000 mg | Freq: Once | INTRAMUSCULAR | Status: AC
  Administered 2012-06-18: 4 mg via INTRAVENOUS

## 2012-06-18 MED ORDER — ONDANSETRON HCL 4 MG/2ML IJ SOLN
4.0000 mg | Freq: Four times a day (QID) | INTRAMUSCULAR | Status: DC | PRN
  Administered 2012-06-18 – 2012-06-22 (×4): 4 mg via INTRAVENOUS
  Filled 2012-06-18 (×4): qty 2

## 2012-06-18 MED ORDER — INSULIN ASPART 100 UNIT/ML ~~LOC~~ SOLN
0.0000 [IU] | SUBCUTANEOUS | Status: DC
  Administered 2012-06-18: 3 [IU] via SUBCUTANEOUS
  Administered 2012-06-19: 1 [IU] via SUBCUTANEOUS
  Administered 2012-06-19 (×2): 3 [IU] via SUBCUTANEOUS
  Administered 2012-06-19: 2 [IU] via SUBCUTANEOUS
  Administered 2012-06-19: 3 [IU] via SUBCUTANEOUS
  Administered 2012-06-20 – 2012-06-21 (×6): 2 [IU] via SUBCUTANEOUS
  Administered 2012-06-22 (×2): 3 [IU] via SUBCUTANEOUS
  Administered 2012-06-22: 2 [IU] via SUBCUTANEOUS

## 2012-06-18 MED ORDER — SODIUM CHLORIDE 0.9 % IV SOLN: Freq: Once | INTRAVENOUS | Status: DC

## 2012-06-18 MED ORDER — SODIUM CHLORIDE 0.9 % IV BOLUS (SEPSIS)
1000.0000 mL | INTRAVENOUS | Status: AC
  Administered 2012-06-18: 1000 mL via INTRAVENOUS

## 2012-06-18 MED ORDER — SODIUM CHLORIDE 0.9 % IV SOLN: 250.0000 mL | INTRAVENOUS | Status: DC | PRN

## 2012-06-18 NOTE — ED Notes (Signed)
MD at bedside. 

## 2012-06-18 NOTE — Consult Note (Signed)
Reason for Consult: Anemia and heme positive stool Referring Physician: CCM  Alfonzo Beers HPI: This is a 78 year old male who was brought in to the ER after he feel down.  He was in the bathroom when he fell and it took him two days to obtain help.  Upon admission his HGB was noted to be in 3 range.  He reports feeling weak for the past few days, but no reports of chest pain or SOB.  The patient does report having black stools and he states that he vomited some blood in the ER.  No complaints of abdominal pain and he denies any history of PUD.  No prior EGD or colonoscopies.  He was also noted to have an elevation in his liver enzymes.  No documentation of hypotension.  Past Medical History  Diagnosis Date  . Smoker   . Narcolepsy   . Hypertension   . BPH (benign prostatic hyperplasia)   . Arthritis   . Diabetes mellitus   . Neuropathy in diabetes   . Herpes   . Dyslipidemia   . Obesity     History reviewed. No pertinent past surgical history.  History reviewed. No pertinent family history.  Social History:  reports that he quit smoking about 13 years ago. He has never used smokeless tobacco. His alcohol and drug histories not on file.  Allergies: No Known Allergies  Medications:  Scheduled:   . insulin aspart  0-15 Units Subcutaneous Q4H  . ondansetron (ZOFRAN) IV  4 mg Intravenous Once  . pantoprazole (PROTONIX) IV  40 mg Intravenous Once   Continuous:   . sodium chloride    . sodium chloride 1,000 mL (06/18/12 1643)    Results for orders placed during the hospital encounter of 06/18/12 (from the past 24 hour(s))  CK TOTAL AND CKMB     Status: Abnormal   Collection Time   06/18/12  2:12 PM      Component Value Range   Total CK 181  7 - 232 U/L   CK, MB 6.3 (*) 0.3 - 4.0 ng/mL   Relative Index 3.5 (*) 0.0 - 2.5  TROPONIN I     Status: Abnormal   Collection Time   06/18/12  2:12 PM      Component Value Range   Troponin I 0.64 (*) <0.30 ng/mL  CBC      Status: Abnormal   Collection Time   06/18/12  2:18 PM      Component Value Range   WBC 4.9  4.0 - 10.5 K/uL   RBC 1.92 (*) 4.22 - 5.81 MIL/uL   Hemoglobin 3.6 (*) 13.0 - 17.0 g/dL   HCT 82.9 (*) 56.2 - 13.0 %   MCV 69.3 (*) 78.0 - 100.0 fL   MCH 18.8 (*) 26.0 - 34.0 pg   MCHC 27.1 (*) 30.0 - 36.0 g/dL   RDW 86.5 (*) 78.4 - 69.6 %   Platelets 35 (*) 150 - 400 K/uL  COMPREHENSIVE METABOLIC PANEL     Status: Abnormal   Collection Time   06/18/12  2:18 PM      Component Value Range   Sodium 142  135 - 145 mEq/L   Potassium 4.7  3.5 - 5.1 mEq/L   Chloride 99  96 - 112 mEq/L   CO2 15 (*) 19 - 32 mEq/L   Glucose, Bld 208 (*) 70 - 99 mg/dL   BUN 43 (*) 6 - 23 mg/dL   Creatinine, Ser 2.95 (*)  0.50 - 1.35 mg/dL   Calcium 9.0  8.4 - 16.1 mg/dL   Total Protein 6.7  6.0 - 8.3 g/dL   Albumin 3.6  3.5 - 5.2 g/dL   AST 096 (*) 0 - 37 U/L   ALT 545 (*) 0 - 53 U/L   Alkaline Phosphatase 58  39 - 117 U/L   Total Bilirubin 0.6  0.3 - 1.2 mg/dL   GFR calc non Af Amer 29 (*) >90 mL/min   GFR calc Af Amer 33 (*) >90 mL/min  ETHANOL     Status: Normal   Collection Time   06/18/12  2:18 PM      Component Value Range   Alcohol, Ethyl (B) <11  0 - 11 mg/dL  LACTIC ACID, PLASMA     Status: Abnormal   Collection Time   06/18/12  2:18 PM      Component Value Range   Lactic Acid, Venous 10.5 (*) 0.5 - 2.2 mmol/L  OCCULT BLOOD, POC DEVICE     Status: Normal   Collection Time   06/18/12  3:56 PM      Component Value Range   Fecal Occult Bld POSITIVE    PREPARE RBC (CROSSMATCH)     Status: Normal   Collection Time   06/18/12  4:30 PM      Component Value Range   Order Confirmation ORDER PROCESSED BY BLOOD BANK    PROTIME-INR     Status: Abnormal   Collection Time   06/18/12  4:30 PM      Component Value Range   Prothrombin Time 19.7 (*) 11.6 - 15.2 seconds   INR 1.73 (*) 0.00 - 1.49  APTT     Status: Normal   Collection Time   06/18/12  4:30 PM      Component Value Range   aPTT 37  24 -  37 seconds  TYPE AND SCREEN     Status: Normal (Preliminary result)   Collection Time   06/18/12  4:30 PM      Component Value Range   ABO/RH(D) O POS     Antibody Screen NEG     Sample Expiration 06/21/2012     Unit Number [id number]     Blood Component Type RBC, LR IRR     Unit division 00     Status of Unit ISSUED     Transfusion Status OK TO TRANSFUSE     Crossmatch Result Compatible     Unit Number [id number]     Blood Component Type RED CELLS,LR     Unit division 00     Status of Unit ALLOCATED     Transfusion Status OK TO TRANSFUSE     Crossmatch Result Compatible     Unit Number [id number]     Blood Component Type RED CELLS,LR     Unit division 00     Status of Unit ALLOCATED     Transfusion Status OK TO TRANSFUSE     Crossmatch Result Compatible     Unit Number [id number]     Blood Component Type RBC LR PHER1     Unit division 00     Status of Unit ALLOCATED     Transfusion Status OK TO TRANSFUSE     Crossmatch Result Compatible    ABO/RH     Status: Normal   Collection Time   06/18/12  4:30 PM      Component Value Range   ABO/RH(D) O POS    TECHNOLOGIST SMEAR  REVIEW     Status: Normal   Collection Time   06/18/12  6:01 PM      Component Value Range   Tech Review       Value: MICROCYTIC,HYPOCHROMIC CELLS.  ANISOCYTOSIS PRESENT. ELLIPTOCYTES AND RARE TARGET CELLS.    FIBRINOGEN     Status: Normal   Collection Time   06/18/12  6:02 PM      Component Value Range   Fibrinogen 249  204 - 475 mg/dL  GLUCOSE, CAPILLARY     Status: Abnormal   Collection Time   06/18/12  7:21 PM      Component Value Range   Glucose-Capillary 179 (*) 70 - 99 mg/dL     Dg Chest 1 View  87/56/4332  *RADIOLOGY REPORT*  Clinical Data: Larey Seat in bathroom, shortness of breath when recumbent for pelvic radiograph, history hypertension, smoking, diabetes  CHEST - 1 VIEW  Comparison: 08/01/2009  Findings: Enlargement of cardiac silhouette with pulmonary vascular congestion.  Decreased lung volumes with mild bibasilar atelectasis. Atherosclerotic calcification aorta. No definite infiltrate, pleural effusion, or pneumothorax. No fractures identified.  IMPRESSION: Enlargement of cardiac silhouette. Bibasilar atelectasis.   Original Report Authenticated By: Lollie Marrow, M.D.    Dg Pelvis 1-2 Views  06/18/2012  *RADIOLOGY REPORT*  Clinical Data: Larey Seat in bathroom, generalized pain and stiffness in both hips  PELVIS - 1-2 VIEW  Comparison: None  Findings: Bones appear mildly demineralized. Mild bilateral narrowing of the hip joints. Symmetric SI joints. No acute fracture, dislocation, or bone destruction. Small nonspecific focus of sclerosis at the proximal right femoral diaphysis. Scattered atherosclerotic calcifications.  IMPRESSION: No definite acute bony abnormalities.   Original Report Authenticated By: Lollie Marrow, M.D.    Ct Head Wo Contrast  06/18/2012  *RADIOLOGY REPORT*  Clinical Data:  78 year old male status post fall.  CT HEAD WITHOUT CONTRAST CT CERVICAL SPINE WITHOUT CONTRAST  Technique:  Multidetector CT imaging of the head and cervical spine was performed following the standard protocol without intravenous contrast.  Multiplanar CT image reconstructions of the cervical spine were also generated.  Comparison:   None  CT HEAD  Findings: No definite acute scalp soft tissue injury.  No acute orbit soft tissue findings.  Mild paranasal sinus mucosal thickening.  Mastoids are clear.  Calvarium intact.  Scattered dural calcifications. Calcified atherosclerosis at the skull base.  Cerebral volume is within normal limits for age.  No midline shift, ventriculomegaly, mass effect, evidence of mass lesion, intracranial hemorrhage or evidence of cortically based acute infarction.  Gray-white matter differentiation is within normal limits throughout the brain.  IMPRESSION: 1.  Negative for age noncontrast CT appearance of the brain. 2.  See cervical spine findings below.  CT  CERVICAL SPINE  Findings: Straightening of cervical lordosis. Visualized skull base is intact.  No atlanto-occipital dissociation.  Cervicothoracic junction alignment is within normal limits.  Lower cervical disc space narrowing.  Occasional small lucent lesions in the cervical spine, but no year most appear related to nutrient foramen.  No destructive or suspicious osseous lesion identified.  No acute cervical fracture identified.  There is a trace retropharyngeal effusion.  Partially retropharyngeal course of the left carotid artery.  Carotid calcified atherosclerosis.  Other paraspinal soft tissues are within normal limits.  IMPRESSION: 1. No acute fracture or listhesis identified in the cervical spine. Ligamentous injury is not excluded. 2.  Trace retropharyngeal effusion.  This is nonspecific but sometimes is a sign of cervical anterior ligamentous injury. Cervical MRI would  confirm if the patient's cervical spine cannot be cleared clinically.   Original Report Authenticated By: Harley Hallmark, M.D.    Ct Cervical Spine Wo Contrast  06/18/2012  *RADIOLOGY REPORT*  Clinical Data:  78 year old male status post fall.  CT HEAD WITHOUT CONTRAST CT CERVICAL SPINE WITHOUT CONTRAST  Technique:  Multidetector CT imaging of the head and cervical spine was performed following the standard protocol without intravenous contrast.  Multiplanar CT image reconstructions of the cervical spine were also generated.  Comparison:   None  CT HEAD  Findings: No definite acute scalp soft tissue injury.  No acute orbit soft tissue findings.  Mild paranasal sinus mucosal thickening.  Mastoids are clear.  Calvarium intact.  Scattered dural calcifications. Calcified atherosclerosis at the skull base.  Cerebral volume is within normal limits for age.  No midline shift, ventriculomegaly, mass effect, evidence of mass lesion, intracranial hemorrhage or evidence of cortically based acute infarction.  Gray-white matter differentiation is  within normal limits throughout the brain.  IMPRESSION: 1.  Negative for age noncontrast CT appearance of the brain. 2.  See cervical spine findings below.  CT CERVICAL SPINE  Findings: Straightening of cervical lordosis. Visualized skull base is intact.  No atlanto-occipital dissociation.  Cervicothoracic junction alignment is within normal limits.  Lower cervical disc space narrowing.  Occasional small lucent lesions in the cervical spine, but no year most appear related to nutrient foramen.  No destructive or suspicious osseous lesion identified.  No acute cervical fracture identified.  There is a trace retropharyngeal effusion.  Partially retropharyngeal course of the left carotid artery.  Carotid calcified atherosclerosis.  Other paraspinal soft tissues are within normal limits.  IMPRESSION: 1. No acute fracture or listhesis identified in the cervical spine. Ligamentous injury is not excluded. 2.  Trace retropharyngeal effusion.  This is nonspecific but sometimes is a sign of cervical anterior ligamentous injury. Cervical MRI would confirm if the patient's cervical spine cannot be cleared clinically.   Original Report Authenticated By: Ulla Potash III, M.D.     ROS:  As stated above in the HPI otherwise negative.  Blood pressure 143/66, pulse 87, temperature 98.1 F (36.7 C), temperature source Oral, resp. rate 17, SpO2 100.00%.    PE: Gen: NAD, Alert and Oriented, pale HEENT:  [place]/AT, EOMI Neck: Supple, no LAD Lungs: CTA Bilaterally CV: RRR without M/G/R ABM: Soft, NTND, +BS Ext: No C/C/E  Assessment/Plan: 1) Severe anemia. 2) Melena and heme positive stool. 3) Elevated liver enzymes.   The patient has a GI bleed and the presumptive source is the upper GI tract with his melena.  He is hemodynamically stable at this time and receiving blood transfusions.  Upon my examination I do not detect any evidence of jaundice, which was reported to me by the consulting Hospitalist.  He is pale and has  muddy sclera, but no evidence of jaundice.  This is consistent with his TB of 0.6, however, he does have an elevation in his transaminases.  Further evaluation is required for this issue.  Plan: 1) EGD tomorrow. 2) Transfuse. 3) Agree with Protonix. 4) Check acute liver panel.  Neziah Vogelgesang D 06/18/2012, 7:24 PM

## 2012-06-18 NOTE — ED Provider Notes (Signed)
History     CSN: [id number]  Arrival date & time 06/18/12  1341   First MD Initiated Contact with Patient 06/18/12 1346      Chief Complaint  Patient presents with  . Fall    (Consider location/radiation/quality/duration/timing/severity/associated sxs/prior treatment) HPI Comments: Mr. Lundstrom presents via EMS for evaluation.  He states he fell after using his bathroom 2 days ago and was unable to get back upright.  He reports laying in the floor for over 24 hours before finally being able to crawl for help.  He states he broke a window in his home before calling EMS using his cell phone.  He states he is not sure why he fell and just feels tired and weak.  He denies head or neck pain, CP, SOB, abdominal pain, NVD, dysuria, seizure, unilateral weakness, and fainting.  He reports he has pain in his right leg but that it is not acute.  The history is provided by the patient. No language interpreter was used.    Past Medical History  Diagnosis Date  . Smoker   . Narcolepsy   . Hypertension   . BPH (benign prostatic hyperplasia)   . Arthritis   . Diabetes mellitus   . Neuropathy in diabetes   . Herpes   . Dyslipidemia   . Obesity     History reviewed. No pertinent past surgical history.  History reviewed. No pertinent family history.  History  Substance Use Topics  . Smoking status: Former Smoker    Quit date: 08/26/1998  . Smokeless tobacco: Never Used  . Alcohol Use: Not on file      Review of Systems  Constitutional: Positive for fatigue. Negative for fever, chills, diaphoresis, activity change and appetite change.  HENT: Negative for congestion, sore throat, facial swelling, rhinorrhea, drooling, trouble swallowing, neck pain and tinnitus.   Eyes: Negative for pain and visual disturbance.  Respiratory: Negative for apnea, cough, chest tightness and shortness of breath.   Cardiovascular: Negative for chest pain and palpitations. Leg swelling: chronic.    Gastrointestinal: Positive for nausea and vomiting. Negative for abdominal pain, diarrhea (Loose black stools for several days), constipation, blood in stool ("They have been black"), abdominal distention and anal bleeding.  Genitourinary: Negative for dysuria, frequency, hematuria, flank pain, decreased urine volume and difficulty urinating.  Musculoskeletal: Positive for back pain and arthralgias. Negative for joint swelling (Chronic bilateral lower extremity edema).  Skin: Positive for pallor. Negative for rash and wound.  Neurological: Positive for weakness and light-headedness. Negative for dizziness, tremors, seizures, syncope and numbness.  Hematological: Does not bruise/bleed easily.  Psychiatric/Behavioral: Positive for dysphoric mood (Reports feeling tired, rundown, and loss since the passing of his wife). Negative for suicidal ideas, confusion and self-injury. The patient is not nervous/anxious.     Allergies  Review of patient's allergies indicates no known allergies.  Home Medications   Current Outpatient Rx  Name Route Sig Dispense Refill  . ACETAMINOPHEN 325 MG PO TABS Oral Take 650 mg by mouth every 6 (six) hours as needed. For pain    . ASPIRIN 81 MG PO TABS Oral Take 81 mg by mouth daily.    Marland Kitchen BISACODYL 5 MG PO TBEC Oral Take 5 mg by mouth daily as needed. For constipation    . FAMOTIDINE 20 MG PO TABS Oral Take 20 mg by mouth 2 (two) times daily.    . FUROSEMIDE 40 MG PO TABS Oral Take 40 mg by mouth daily.    Marland Kitchen GLIPIZIDE  10 MG PO TABS Oral Take 10 mg by mouth 2 (two) times daily.    Marland Kitchen LISINOPRIL-HYDROCHLOROTHIAZIDE 20-12.5 MG PO TABS Oral Take 1 tablet by mouth daily.    Marland Kitchen METFORMIN HCL 1000 MG PO TABS Oral Take 1,000 mg by mouth 2 (two) times daily with a meal.    . PAROXETINE HCL 20 MG PO TABS Oral Take 20 mg by mouth every morning.    Marland Kitchen PRAVASTATIN SODIUM 40 MG PO TABS Oral Take 40 mg by mouth every evening.    . SENNA-DOCUSATE SODIUM 8.6-50 MG PO TABS Oral Take 2  tablets by mouth daily.      Marland Kitchen TERAZOSIN HCL 5 MG PO CAPS Oral Take 5 mg by mouth daily.      SpO2 100%  Physical Exam  Nursing note and vitals reviewed. Constitutional: He is oriented to person, place, and time. He appears well-developed and well-nourished. No distress.  HENT:  Head: Normocephalic and atraumatic.  Right Ear: External ear normal.  Left Ear: External ear normal.  Nose: Nose normal.  Mouth/Throat: Oropharynx is clear and moist. No oropharyngeal exudate.  Eyes: Conjunctivae normal are normal. Pupils are equal, round, and reactive to light. Right eye exhibits no discharge. Left eye exhibits no discharge. Scleral icterus (Mild) is present.  Neck: Trachea normal, normal range of motion and phonation normal. Neck supple. Normal carotid pulses and no JVD present. No tracheal tenderness, no spinous process tenderness and no muscular tenderness present. Carotid bruit is not present. No tracheal deviation present.  Cardiovascular: Normal rate, intact distal pulses and normal pulses.  An irregularly irregular rhythm present. PMI is not displaced.  Exam reveals no decreased pulses.   Murmur heard.  Systolic murmur is present with a grade of 2/6  Pulmonary/Chest: Effort normal. No stridor. Not bradypneic. No respiratory distress. He has decreased breath sounds. He has no wheezes. He has no rhonchi. He has no rales. He exhibits no tenderness.  Abdominal: Soft. Bowel sounds are normal. He exhibits no distension and no mass. There is no tenderness (Protuberant, large abdomen.). There is no rebound and no guarding.  Musculoskeletal: Normal range of motion. He exhibits edema. He exhibits no tenderness.       Extremities intact x4. No deformities on inspection of shoulders, elbows, wrists, hips, knees, ankles. Pelvis is stable.  Lymphadenopathy:    He has no cervical adenopathy.  Neurological: He is alert and oriented to person, place, and time. No cranial nerve deficit. He exhibits normal  muscle tone.  Skin: Skin is warm and dry. No rash (Scaly skin. Bilateral chronic lower extremity lymphedematous skin changes.) noted. He is not diaphoretic. No erythema. There is pallor.  Psychiatric: He has a normal mood and affect. His behavior is normal.    ED Course  Procedures (including critical care time)  Labs Reviewed  CK TOTAL AND CKMB - Abnormal; Notable for the following:    CK, MB 6.3 (*)     Relative Index 3.5 (*)     All other components within normal limits  TROPONIN I - Abnormal; Notable for the following:    Troponin I 0.64 (*)     All other components within normal limits  CBC - Abnormal; Notable for the following:    RBC 1.92 (*)     Hemoglobin 3.6 (*)     HCT 13.3 (*)     MCV 69.3 (*)     MCH 18.8 (*)     MCHC 27.1 (*)     RDW  25.3 (*)     Platelets 35 (*)     All other components within normal limits  COMPREHENSIVE METABOLIC PANEL - Abnormal; Notable for the following:    CO2 15 (*)     Glucose, Bld 208 (*)     BUN 43 (*)     Creatinine, Ser 2.11 (*)     AST 657 (*)     ALT 545 (*)     GFR calc non Af Amer 29 (*)     GFR calc Af Amer 33 (*)     All other components within normal limits  LACTIC ACID, PLASMA - Abnormal; Notable for the following:    Lactic Acid, Venous 10.5 (*)     All other components within normal limits  ETHANOL  OCCULT BLOOD, POC DEVICE  URINALYSIS, ROUTINE W REFLEX MICROSCOPIC  URINE RAPID DRUG SCREEN (HOSP PERFORMED)  PREPARE RBC (CROSSMATCH)  PROTIME-INR  APTT   Dg Chest 1 View  06/18/2012  *RADIOLOGY REPORT*  Clinical Data: Larey Seat in bathroom, shortness of breath when recumbent for pelvic radiograph, history hypertension, smoking, diabetes  CHEST - 1 VIEW  Comparison: 08/01/2009  Findings: Enlargement of cardiac silhouette with pulmonary vascular congestion. Decreased lung volumes with mild bibasilar atelectasis. Atherosclerotic calcification aorta. No definite infiltrate, pleural effusion, or pneumothorax. No fractures  identified.  IMPRESSION: Enlargement of cardiac silhouette. Bibasilar atelectasis.   Original Report Authenticated By: Lollie Marrow, M.D.    Dg Pelvis 1-2 Views  06/18/2012  *RADIOLOGY REPORT*  Clinical Data: Larey Seat in bathroom, generalized pain and stiffness in both hips  PELVIS - 1-2 VIEW  Comparison: None  Findings: Bones appear mildly demineralized. Mild bilateral narrowing of the hip joints. Symmetric SI joints. No acute fracture, dislocation, or bone destruction. Small nonspecific focus of sclerosis at the proximal right femoral diaphysis. Scattered atherosclerotic calcifications.  IMPRESSION: No definite acute bony abnormalities.   Original Report Authenticated By: Lollie Marrow, M.D.    Ct Head Wo Contrast  06/18/2012  *RADIOLOGY REPORT*  Clinical Data:  78 year old male status post fall.  CT HEAD WITHOUT CONTRAST CT CERVICAL SPINE WITHOUT CONTRAST  Technique:  Multidetector CT imaging of the head and cervical spine was performed following the standard protocol without intravenous contrast.  Multiplanar CT image reconstructions of the cervical spine were also generated.  Comparison:   None  CT HEAD  Findings: No definite acute scalp soft tissue injury.  No acute orbit soft tissue findings.  Mild paranasal sinus mucosal thickening.  Mastoids are clear.  Calvarium intact.  Scattered dural calcifications. Calcified atherosclerosis at the skull base.  Cerebral volume is within normal limits for age.  No midline shift, ventriculomegaly, mass effect, evidence of mass lesion, intracranial hemorrhage or evidence of cortically based acute infarction.  Gray-white matter differentiation is within normal limits throughout the brain.  IMPRESSION: 1.  Negative for age noncontrast CT appearance of the brain. 2.  See cervical spine findings below.  CT CERVICAL SPINE  Findings: Straightening of cervical lordosis. Visualized skull base is intact.  No atlanto-occipital dissociation.  Cervicothoracic junction alignment  is within normal limits.  Lower cervical disc space narrowing.  Occasional small lucent lesions in the cervical spine, but no year most appear related to nutrient foramen.  No destructive or suspicious osseous lesion identified.  No acute cervical fracture identified.  There is a trace retropharyngeal effusion.  Partially retropharyngeal course of the left carotid artery.  Carotid calcified atherosclerosis.  Other paraspinal soft tissues are within normal limits.  IMPRESSION:  1. No acute fracture or listhesis identified in the cervical spine. Ligamentous injury is not excluded. 2.  Trace retropharyngeal effusion.  This is nonspecific but sometimes is a sign of cervical anterior ligamentous injury. Cervical MRI would confirm if the patient's cervical spine cannot be cleared clinically.   Original Report Authenticated By: Harley Hallmark, M.D.    Ct Cervical Spine Wo Contrast  06/18/2012  *RADIOLOGY REPORT*  Clinical Data:  78 year old male status post fall.  CT HEAD WITHOUT CONTRAST CT CERVICAL SPINE WITHOUT CONTRAST  Technique:  Multidetector CT imaging of the head and cervical spine was performed following the standard protocol without intravenous contrast.  Multiplanar CT image reconstructions of the cervical spine were also generated.  Comparison:   None  CT HEAD  Findings: No definite acute scalp soft tissue injury.  No acute orbit soft tissue findings.  Mild paranasal sinus mucosal thickening.  Mastoids are clear.  Calvarium intact.  Scattered dural calcifications. Calcified atherosclerosis at the skull base.  Cerebral volume is within normal limits for age.  No midline shift, ventriculomegaly, mass effect, evidence of mass lesion, intracranial hemorrhage or evidence of cortically based acute infarction.  Gray-white matter differentiation is within normal limits throughout the brain.  IMPRESSION: 1.  Negative for age noncontrast CT appearance of the brain. 2.  See cervical spine findings below.  CT CERVICAL  SPINE  Findings: Straightening of cervical lordosis. Visualized skull base is intact.  No atlanto-occipital dissociation.  Cervicothoracic junction alignment is within normal limits.  Lower cervical disc space narrowing.  Occasional small lucent lesions in the cervical spine, but no year most appear related to nutrient foramen.  No destructive or suspicious osseous lesion identified.  No acute cervical fracture identified.  There is a trace retropharyngeal effusion.  Partially retropharyngeal course of the left carotid artery.  Carotid calcified atherosclerosis.  Other paraspinal soft tissues are within normal limits.  IMPRESSION: 1. No acute fracture or listhesis identified in the cervical spine. Ligamentous injury is not excluded. 2.  Trace retropharyngeal effusion.  This is nonspecific but sometimes is a sign of cervical anterior ligamentous injury. Cervical MRI would confirm if the patient's cervical spine cannot be cleared clinically.   Original Report Authenticated By: Harley Hallmark, M.D.      No diagnosis found.   Date: 06/18/2012  Rate: 89 bpm  Rhythm: atrial fibrillation  QRS Axis: normal  Intervals: normal (absent PR int)  ST/T Wave abnormalities: ST depressions laterally  Conduction Disutrbances:none  Narrative Interpretation:   Old EKG Reviewed: note changes in V5 and V6      MDM  Pt presents for evaluation after falling in his bathroom.  He reports being on the floor for over 24 hours.  He reports feeling ill and weak with nausea, vomiting, and some right leg discomfort but he denies abdominal pain.  He denies neck pain or HA also.  Note stable prehospital vital signs and he is A&O.  Will obtain a CT scan of the head and cspine, basic labs, cardiac markers, ECG, CXR, and urinalysis.  Will reassess.  Note severe anemia.  Pt reports black stools and is positive for fecal occult blood.  Will crossmatch for 4 units PRBCs and transfuse when available.  Will obtain coags and administer  IVF.  Note mildly elevated CK-MB and trop.  His EKG demonstrates afib with some new lateral ST changes.  Secondary to GI bleed, will not administer aspirin or lovenox/heparin.  Pt also denies chest pain and shortness of breath.  Will also administer protonix.  He has vomited several times since arriving but the emesis is just clear and mucous-like fluid.  I do no suspect a variceal bleed and will not administer octreotide.  Noted renal failure does not appear to be acute.  Also note elevations in the AST and ALT with a nl Alk phos and Tbili.  Mr. Rupani denies any history of alcohol abuse, hepatitis, cirrhosis, and GI bleeding.  1650.  I discussed his evaluation with Dr. Elnoria Howard (gastroenterology) and Dr. Darrol Angel (critical care).  Plan admit for further mgmnt.     CRITICAL CARE Performed by: Dana Allan T   Total critical care time: 35-40+  Critical care time was exclusive of separately billable procedures and treating other patients.  Critical care was necessary to treat or prevent imminent or life-threatening deterioration.  Critical care was time spent personally by me on the following activities: development of treatment plan with patient and/or surrogate as well as nursing, discussions with consultants, evaluation of patient's response to treatment, examination of patient, obtaining history from patient or surrogate, ordering and performing treatments and interventions, ordering and review of laboratory studies, ordering and review of radiographic studies, pulse oximetry and re-evaluation of patient's condition.          Tobin Chad, MD 06/18/12 [phone]

## 2012-06-18 NOTE — ED Notes (Signed)
Bed request clarified w/Dr D. Simonds.  TOV ICU bed 2100 preferred  Dr Marcell Barlow RN

## 2012-06-18 NOTE — ED Notes (Signed)
Pt fell yesterday while walking to bathroom & lost balance. Reports has been on floor since then, was finally able to call 911. Pt c/o back pain. No obvious deformities seen. Denies neck pain. Confused upon EMS arrival

## 2012-06-18 NOTE — H&P (Signed)
Name: Nikai Quest MRN: [id number] DOB: 1934-04-27    LOS: 0  Referring Provider:  EDP Reason for Referral:  Severe anemia, found down  PULMONARY / CRITICAL CARE MEDICINE  HPI:  78 yo male with h/o HTN, DM and narcolepsy who was brought to the ED by EMS after a fall and inability to get back up for 2 days. He states that he fell after using the bathroom and could not get back up. He denies any loss of consciousness at the time. He was finally able to call EMS after laying on the floor for 24 hours. He denies hitting his head. Denies any headache or neck pain. In the ED, he was found to have a hemoglobin of 3.6. He reports noticing tarry stools. He denies any red blood per rectum and also denies any hematemesis. He denies any nausea, vomiting or abdominal pain. He has been taking over the counter pain medicine but cannot recall what. He overall feels weak and tired.  He denies any shortness of breath or chest pain, any dizziness.    CCM was called for management of profound anemia.    Past Medical History  Diagnosis Date  . Smoker   . Narcolepsy   . Hypertension   . BPH (benign prostatic hyperplasia)   . Arthritis   . Diabetes mellitus   . Neuropathy in diabetes   . Herpes   . Dyslipidemia   . Obesity    History reviewed. No pertinent past surgical history. Prior to Admission medications   Medication Sig Start Date End Date Taking? Authorizing Provider  acetaminophen (TYLENOL) 325 MG tablet Take 650 mg by mouth every 6 (six) hours as needed. For pain   Yes Historical Provider, MD  aspirin 81 MG tablet Take 81 mg by mouth daily.   Yes Historical Provider, MD  bisacodyl (DULCOLAX) 5 MG EC tablet Take 5 mg by mouth daily as needed. For constipation   Yes Historical Provider, MD  famotidine (PEPCID) 20 MG tablet Take 20 mg by mouth 2 (two) times daily. 01/14/12  Yes Ronnald Nian, MD  furosemide (LASIX) 40 MG tablet Take 40 mg by mouth daily. 03/30/12 03/30/13 Yes Ronnald Nian, MD    glipiZIDE (GLUCOTROL) 10 MG tablet Take 10 mg by mouth 2 (two) times daily. 03/30/12 03/30/13 Yes Ronnald Nian, MD  lisinopril-hydrochlorothiazide (PRINZIDE,ZESTORETIC) 20-12.5 MG per tablet Take 1 tablet by mouth daily. 03/30/12 03/30/13 Yes Ronnald Nian, MD  metFORMIN (GLUCOPHAGE) 1000 MG tablet Take 1,000 mg by mouth 2 (two) times daily with a meal.   Yes Historical Provider, MD  PARoxetine (PAXIL) 20 MG tablet Take 20 mg by mouth every morning. 04/10/12 04/10/13 Yes Kermit Balo Tysinger, PA  pravastatin (PRAVACHOL) 40 MG tablet Take 40 mg by mouth every evening. 03/30/12 03/30/13 Yes Ronnald Nian, MD  sennosides-docusate sodium (SENOKOT-S) 8.6-50 MG tablet Take 2 tablets by mouth daily.     Yes Historical Provider, MD  terazosin (HYTRIN) 5 MG capsule Take 5 mg by mouth daily. 03/30/12 03/30/13 Yes Ronnald Nian, MD   Allergies No Known Allergies  Family History History reviewed. No pertinent family history. Social History  reports that he quit smoking about 13 years ago. He has never used smokeless tobacco. His alcohol and drug histories not on file.  Review Of Systems:  Negative except per HPI  Brief patient description:   78 yo male with DM2, HTN, narcolepsy who was admitted for management of anemia with hemoglobin of 3.6  likely from upper GI bleed.   Events Since Admission: 10/24 transfusion of 4u pRBC  Current Status: Critically ill Vital Signs: Resp:  [18] 18  (10/24 1651) BP: (150)/(61) 150/61 mmHg (10/24 1651) SpO2:  [100 %] 100 % (10/24 1651)  Physical Examination: General: obese, elderly male, in no acute distress, pale appearing Neuro:  Alert and oriented x3 HEENT:  Mild scleral icterus, dry mucous membranes Neck:  No jvd, no tenderness to palpation along cervical spine, normal range of motion of neck.  Cardiovascular:  S1s2, irregularly irregular rate Lungs:  Normal work of breathing, no wheezing or crackles appreciated Abdomen:  Soft, non tender, non distended,  obese Musculoskeletal:  +2 pitting edema bilaterally Skin:  Chronic venous stasis changes in lower extremities bilaterally, pale skin.   Active Problems:  * No active hospital problems. *    ASSESSMENT AND PLAN  PULMONARY Ventilator Settings: non applicable   CXR:  10/24: cardiomegaly, bibasilar atelectasis ETT:    A:  No active pulmonary disease P:   O2 supplement as needed to maintain O2>92%   CARDIOVASCULAR  Lab 06/18/12 1418 06/18/12 1412  TROPONINI -- 0.64*  LATICACIDVEN 10.5* --  PROBNP -- --   ECG:   10/24: atrial fibrillation, rate: 89, poor R wave progression, new T wave inversion in lateral leads V5-V6 Lines: 18G and 20G peripheral IV  A:  - a-fib: not present on previous EKG - h/o hypertension: currently hypertensive and hemodynamically stable despite low hemoglobin, which suggests a slow bleed - troponin elevation: likely from demand ischemia   P:  - monitor BP and heart rate - 2D echo in am - trend troponin - am EKG - monitor volume status in setting of multiple transfusions and history of home lasix use Have spoken to him about cvp line, he wants to wait  RENAL  Lab 06/18/12 1418  NA 142  K 4.7  CL 99  CO2 15*  BUN 43*  CREATININE 2.11*  CALCIUM 9.0  MG --  PHOS --   Intake/Output    None    Foley:    A:  Elevated creatinine: likely chronic renal insufficiency. Creatinine at baseline from previous admission in 04/2012 Anion gap acidosis: likely from lactic acidosis given elevated lactic acid: 10.5 P:   - q12 BMP - CK not impressively elevated, but given that patient was down for a long period of time, will obtain repeat am CK to exclude any rhabdo May require lasix with products  GASTROINTESTINAL  Lab 06/18/12 1418  AST 657*  ALT 545*  ALKPHOS 58  BILITOT 0.6  PROT 6.7  ALBUMIN 3.6    A:  likely Upper GI bleed: fecal occult positive without gross blood. Patient reports presence of dark stools making upper GI bleed more  likely Elevated transaminases: acute from previous admission. No history of alcohol use. Likely Shock liver P:   - f/u fractionated bili - am CMP - NG tube for gastric lavage - transfuse 4upRBC  gi consult HEMATOLOGIC  Lab 06/18/12 1630 06/18/12 1418  HGB -- 3.6*  HCT -- 13.3*  PLT -- 35*  INR 1.73* --  APTT 37 --   A:   - Profound anemia: likely acute on chronic given hemodynamically stable state. Likely from upper GI bleed given  positive fecal occult and patient's report of melena  - thrombocytopenia:likely consumptive, however noovert bleeding at house, ensure no ttp - coagulopathy: likely from hepatic dysfunction  P:  - follow GI recommendation - start protonix drip -  large bore IV's obtained - transfuse 4u pRBC - f/u LDH - administer Vitamin K 10 u - obtain peripheral smear to rule out schistocytes and TTP. If no schystocytes present, administer FFP and platelets.  - repeat coags after FFP and platelet transfusion - obtain fibrinogen and factor VIII  INFECTIOUS  Lab 06/18/12 1418  WBC 4.9  PROCALCITON --   Cultures: 10/24: blood culture >>> 10/24 UA to be collected Antibiotics: none  A:  Lactic acidosis P:   Repeat lactic acid in 4 hours Dc metformin  ENDOCRINE A:  Type 2 diabetic on glipizide and metformin at home P:   ISS  NEUROLOGIC  A:  S/p fall Head CT: negative Cervical spine: Trace retropharyngeal effusion. This is nonspecific but sometimes is a sign of cervical anterior ligamentous injury. No cervical spine tenderness and normal range of motion of neck on exam. Called Dr. Bettina Gavia with neurosurgery who did not recommend any further imaging or neck immobilisation  P:   Supportive care, monitor neuro status  BEST PRACTICE / DISPOSITION Level of Care:  ICU Primary Service:  CCM Consultants:  GI Code Status:  full Diet:  NPO DVT Px:  SCD's GI Px:  protonix gtt Skin Integrity:  normal Social / Family:  No family at  bedside  Marena Chancy, PGY-2  Family Medicine Resident  Ccm time 35 min  I have fully examined this patient and agree with above findings.    And edited in full   Nelda Bucks., M.D. Pulmonary and Critical Care Medicine [facility] Pager: [phone]  06/18/2012, 4:53 PM

## 2012-06-18 NOTE — ED Notes (Signed)
MDs at bedside

## 2012-06-19 ENCOUNTER — Other Ambulatory Visit: Payer: Self-pay

## 2012-06-19 ENCOUNTER — Encounter (HOSPITAL_COMMUNITY)
Admission: EM | Disposition: A | Discharge status: Skilled Nursing Facility | Payer: Self-pay | Source: Home / Self Care | Attending: Family Medicine

## 2012-06-19 ENCOUNTER — Encounter (HOSPITAL_COMMUNITY): Payer: Self-pay | Admitting: Gastroenterology

## 2012-06-19 ENCOUNTER — Inpatient Hospital Stay (HOSPITAL_COMMUNITY): Payer: Medicare Other

## 2012-06-19 DIAGNOSIS — W19XXXA Unspecified fall, initial encounter: Secondary | ICD-10-CM

## 2012-06-19 DIAGNOSIS — Y92009 Unspecified place in unspecified non-institutional (private) residence as the place of occurrence of the external cause: Secondary | ICD-10-CM

## 2012-06-19 DIAGNOSIS — E119 Type 2 diabetes mellitus without complications: Secondary | ICD-10-CM

## 2012-06-19 DIAGNOSIS — I517 Cardiomegaly: Secondary | ICD-10-CM

## 2012-06-19 HISTORY — PX: ESOPHAGOGASTRODUODENOSCOPY: SHX5428

## 2012-06-19 LAB — CBC
HCT: 24 % — ABNORMAL LOW (ref 39.0–52.0)
Hemoglobin: 7.2 g/dL — ABNORMAL LOW (ref 13.0–17.0)
MCH: 24.2 pg — ABNORMAL LOW (ref 26.0–34.0)
MCH: 24.1 pg — ABNORMAL LOW (ref 26.0–34.0)
MCHC: 31.6 g/dL (ref 30.0–36.0)
MCHC: 31.3 g/dL (ref 30.0–36.0)
MCHC: 32.1 g/dL (ref 30.0–36.0)
MCV: 76.1 fL — ABNORMAL LOW (ref 78.0–100.0)
Platelets: 58 10*3/uL — ABNORMAL LOW (ref 150–400)
Platelets: <5 10*3/uL — CL (ref 150–400)
RBC: 3.1 MIL/uL — ABNORMAL LOW (ref 4.22–5.81)
RBC: 3.19 MIL/uL — ABNORMAL LOW (ref 4.22–5.81)
RDW: 23.9 % — ABNORMAL HIGH (ref 11.5–15.5)
RDW: 24.4 % — ABNORMAL HIGH (ref 11.5–15.5)
RDW: 24.4 % — ABNORMAL HIGH (ref 11.5–15.5)

## 2012-06-19 LAB — FACTOR 8 ASSAY: Coagulation Factor VIII: 261 % — ABNORMAL HIGH (ref 73–140)

## 2012-06-19 LAB — PHOSPHORUS: Phosphorus: 3.8 mg/dL (ref 2.3–4.6)

## 2012-06-19 LAB — GLUCOSE, CAPILLARY: Glucose-Capillary: 91 mg/dL (ref 70–99)

## 2012-06-19 LAB — COMPREHENSIVE METABOLIC PANEL
AST: 1513 U/L — ABNORMAL HIGH (ref 0–37)
Albumin: 3.6 g/dL (ref 3.5–5.2)
Calcium: 9.2 mg/dL (ref 8.4–10.5)
Creatinine, Ser: 1.98 mg/dL — ABNORMAL HIGH (ref 0.50–1.35)
Total Protein: 6.6 g/dL (ref 6.0–8.3)

## 2012-06-19 LAB — TROPONIN I: Troponin I: 1.07 ng/mL (ref <0.30)

## 2012-06-19 LAB — LACTIC ACID, PLASMA: Lactic Acid, Venous: 1.7 mmol/L (ref 0.5–2.2)

## 2012-06-19 LAB — APTT: aPTT: 32 seconds (ref 24–37)

## 2012-06-19 LAB — MAGNESIUM: Magnesium: 2.3 mg/dL (ref 1.5–2.5)

## 2012-06-19 LAB — PROTIME-INR: INR: 1.51 — ABNORMAL HIGH (ref 0.00–1.49)

## 2012-06-19 LAB — CK: Total CK: 184 U/L (ref 7–232)

## 2012-06-19 SURGERY — EGD (ESOPHAGOGASTRODUODENOSCOPY)
Anesthesia: Moderate Sedation

## 2012-06-19 MED ORDER — FENTANYL CITRATE 0.05 MG/ML IJ SOLN
INTRAMUSCULAR | Status: AC
  Filled 2012-06-19: qty 2

## 2012-06-19 MED ORDER — MIDAZOLAM HCL 10 MG/2ML IJ SOLN
INTRAMUSCULAR | Status: DC | PRN
  Administered 2012-06-19: 2 mg via INTRAVENOUS

## 2012-06-19 MED ORDER — SODIUM CHLORIDE 0.9 % IV SOLN
INTRAVENOUS | Status: DC
  Administered 2012-06-19: 1000 mL via INTRAVENOUS

## 2012-06-19 MED ORDER — PEG 3350-KCL-NA BICARB-NACL 420 G PO SOLR
4000.0000 mL | Freq: Once | ORAL | Status: AC
  Administered 2012-06-19: 4000 mL via ORAL
  Filled 2012-06-19: qty 4000

## 2012-06-19 MED ORDER — SODIUM CHLORIDE 0.9 % IV SOLN: INTRAVENOUS | Status: DC

## 2012-06-19 MED ORDER — METOCLOPRAMIDE HCL 5 MG/ML IJ SOLN
5.0000 mg | Freq: Four times a day (QID) | INTRAMUSCULAR | Status: DC | PRN
  Administered 2012-06-23: 5 mg via INTRAVENOUS
  Filled 2012-06-19 (×2): qty 1

## 2012-06-19 MED ORDER — FENTANYL CITRATE 0.05 MG/ML IJ SOLN
INTRAMUSCULAR | Status: DC | PRN
  Administered 2012-06-19: 25 ug via INTRAVENOUS

## 2012-06-19 MED ORDER — PNEUMOCOCCAL VAC POLYVALENT 25 MCG/0.5ML IJ INJ
0.5000 mL | INJECTION | INTRAMUSCULAR | Status: AC
  Administered 2012-06-20: 0.5 mL via INTRAMUSCULAR
  Filled 2012-06-19: qty 0.5

## 2012-06-19 MED ORDER — DIPHENHYDRAMINE HCL 50 MG/ML IJ SOLN
INTRAMUSCULAR | Status: AC
  Filled 2012-06-19: qty 1

## 2012-06-19 MED ORDER — METOPROLOL TARTRATE 25 MG PO TABS
25.0000 mg | ORAL_TABLET | Freq: Two times a day (BID) | ORAL | Status: DC
  Administered 2012-06-19 – 2012-06-22 (×6): 25 mg via ORAL
  Filled 2012-06-19 (×11): qty 1

## 2012-06-19 MED ORDER — INFLUENZA VIRUS VACC SPLIT PF IM SUSP: 0.5000 mL | INTRAMUSCULAR | Status: DC

## 2012-06-19 MED ORDER — MIDAZOLAM HCL 5 MG/ML IJ SOLN
INTRAMUSCULAR | Status: AC
Start: 2012-06-19 — End: 2012-06-19
  Filled 2012-06-19: qty 2

## 2012-06-19 MED ORDER — SODIUM CHLORIDE 0.9 % IV SOLN
INTRAVENOUS | Status: DC
Start: 2012-06-19 — End: 2012-06-19

## 2012-06-19 NOTE — Progress Notes (Signed)
Nutrition Brief Note  Patient identified on the Malnutrition Screening Tool (MST) Report for weight loss and poor appetite.  Some weight loss noted from February to June 2013, however, weight has been stable for the past 4.5 months.  Wt Readings from Last 10 Encounters:  06/19/12 248 lb 0.3 oz (112.5 kg)  06/19/12 248 lb 0.3 oz (112.5 kg)  05/28/12 245 lb (111.131 kg)  04/10/12 249 lb 8 oz (113.172 kg)  02/25/12 243 lb (110.224 kg)  01/28/12 248 lb (112.492 kg)  01/14/12 254 lb (115.214 kg)  12/11/11 251 lb (113.853 kg)  11/11/11 266 lb (120.657 kg)  10/01/11 263 lb (119.296 kg)    Body mass index is 34.59 kg/(m^2). Pt meets criteria for obesity, class I based on current BMI.   Current diet order is Clear Liquids. Labs and medications reviewed.  Plans for colonoscopy tomorrow.  Diet will likely be advanced after colonoscopy.  Patient is at low nutrition risk.  No nutrition interventions warranted at this time.  Will monitor for diet advancement and follow-up as needed.  If nutrition issues arise, please consult RD.    Joaquin Courts, RD, LDN, CNSC Pager# [phone] After Hours Pager# [phone]

## 2012-06-19 NOTE — Progress Notes (Signed)
CRITICAL VALUE ALERT  Critical value received:  Plt <5  Date of notification:  06/19/2012  Time of notification:  2214  Critical value read back:yes  Nurse who received alert:  Leonia Reeves  MD notified (1st page):  Dr. Frederico Hamman (elink)  Time of first page:  2225  Orders received.

## 2012-06-19 NOTE — Interval H&P Note (Signed)
History and Physical Interval Note:  06/19/2012 8:53 AM  [name]  has presented today for surgery, with the diagnosis of Anemia and Heme positive stool  The various methods of treatment have been discussed with the patient and family. After consideration of risks, benefits and other options for treatment, the patient has consented to  Procedure(s) (LRB) with comments: ESOPHAGOGASTRODUODENOSCOPY (EGD) (N/A) as a surgical intervention .  The patient's history has been reviewed, patient examined, no change in status, stable for surgery.  I have reviewed the patient's chart and labs.  Questions were answered to the patient's satisfaction.     [name]

## 2012-06-19 NOTE — Progress Notes (Signed)
  Echocardiogram 2D Echocardiogram has been performed.  JAZZIEL, FITZSIMMONS 06/19/2012, 9:02 AM

## 2012-06-19 NOTE — Op Note (Signed)
Eligha Bridegroom [facility] [address] [place], [address]   OPERATIVE PROCEDURE REPORT  PATIENT: Neziah, Braley  MR#: #[id number] BIRTHDATE:     GENDER: Male ENDOSCOPIST: Jeani Hawking, MD ASSISTANT:   Olene Craven, technician, Oletha Blend, technician, and Judithann Sauger, RN PROCEDURE DATE: 06/19/2012 PROCEDURE:   EGD, diagnostic ASA CLASS:   Class IV INDICATIONS:melena and heme positive stool. MEDICATIONS: Versed 2 mg IV and Fentanyl 25 mcg IV TOPICAL ANESTHETIC:   none  DESCRIPTION OF PROCEDURE:   After the risks benefits and alternatives of the procedure were thoroughly explained, informed consent was obtained.  The Pentax Gastroscope [id number]  endoscope was introduced through the mouth  and advanced to the second portion of the duodenum Without limitations.      The instrument was slowly withdrawn as the mucosa was fully examined.      FINDINGS: : There was LA Grade A esophagitis noted in the distal esophagus.  The gastric lumen exhibted a mild gastritis in the body of the stomach.  No evidence of any fresh blood or old blood.  No evidence of any ulcerations, erosions, polyps, masses, or vascular abnormalities.   The current findings cannot explain the severe anemia.  Retroflexed views revealed no abnormalities.     The scope was then withdrawn from the patient and the procedure terminated.  COMPLICATIONS: There were no complications.  IMPRESSION: 1) LA Grade A esophagitis. 2) Mild gastritis.  RECOMMENDATIONS: 1) Schedule for the colonoscopy.   _______________________________ eSignedJeani Hawking, MD 06/19/2012 10:27 AM

## 2012-06-19 NOTE — Progress Notes (Signed)
Patient has colon prep ordered for tonight before colonoscopy in the morning. [name] had increased nausea after the 4th cup, so PRN Zofran IV given. Nausea continued then patient started to vomit. He vomited 2 times, about 400 ml orange colored liquid. Abdomen is distended, non-tender.Dr. Loreta Ave with GI notified, ok to hold further prep until am and let him rest overnight. Nausea continued, Dr. Frederico Hamman notified and Reglan ordered. Will continue to monitor.

## 2012-06-19 NOTE — Progress Notes (Signed)
Name: [name] MRN: [id number] DOB: 08/08/1934    LOS: 1  Referring Provider:  EDP Reason for Referral:  Severe anemia, found down  PULMONARY / CRITICAL CARE MEDICINE  Brief patient description:   78 yo male with DM2, HTN, narcolepsy who was admitted after fall and inability to get help for 2 days. Found to have  hemoglobin of 3.6 likely from upper GI bleed.   Events Since Admission: Transfused 4u pRBC's, 2u FFP and 1u platelet EGD done this am: findings: gastritis  Current Status: stable Vital Signs: Temp:  [97.4 F (36.3 C)-98.8 F (37.1 C)] 98.7 F (37.1 C) (10/25 0630) Pulse Rate:  [84-113] 85  (10/25 0700) Resp:  [14-24] 21  (10/25 0700) BP: (129-194)/(59-93) 156/87 mmHg (10/25 0700) SpO2:  [96 %-100 %] 98 % (10/25 0700) Weight:  [243 lb 9.7 oz (110.5 kg)-248 lb 0.3 oz (112.5 kg)] 248 lb 0.3 oz (112.5 kg) (10/25 0500)  Physical Examination: General: obese, elderly male, in no acute distress Neuro:  Alert and oriented x3 HEENT: dry mucous membranes Neck:  No jvd, no tenderness to palpation along cervical spine, normal range of motion of neck.  Cardiovascular:  S1s2, regular rate and rhythm Lungs:  Normal work of breathing, no wheezing or crackles appreciated Abdomen:  Soft, non tender, non distended, obese Musculoskeletal:  +2 pitting edema bilaterally Skin:  Chronic venous stasis changes in lower extremities bilaterally, pale skin.   ASSESSMENT AND PLAN  PULMONARY Ventilator Settings: non applicable   CXR:  10/24: cardiomegaly, bibasilar atelectasis ETT:    A:  No active pulmonary disease P:   O2 supplement as needed to maintain O2>92% No evidence of fluid overload after blood products  CARDIOVASCULAR  Lab 06/18/12 2141 06/18/12 2135 06/18/12 1418 06/18/12 1412  TROPONINI -- 0.58* -- 0.64*  LATICACIDVEN 5.5* -- 10.5* --  PROBNP -- -- -- --   ECG:   10/24: atrial fibrillation, rate: 89, poor R wave progression, new T wave inversion in  lateral leads V5-V6 10/25: V5-V6 T wave reversal, rate: 80 Lines: peripheral IV's x3  A:  - a-fib: not present on previous EKG, currently not in a-fib.  - h/o hypertension: persistently hypertensive and hemodynamically stable despite low hemoglobin, which suggests a slow bleed - troponin elevation: likely from demand ischemia, trending down  P:  - hypertension: start lopressor 25mg  bid  - f/u 2D echo  - f/u troponin after blood product administration - monitor volume status in setting of multiple transfusions  RENAL  Lab 06/18/12 1418  NA 142  K 4.7  CL 99  CO2 15*  BUN 43*  CREATININE 2.11*  CALCIUM 9.0  MG --  PHOS --   Intake/Output      10/24 0701 - 10/25 0700 10/25 0701 - 10/26 0700   I.V. (mL/kg) 529.2 (4.7)    Blood 2244    IV Piggyback 52    Total Intake(mL/kg) 2825.2 (25.1)    Urine (mL/kg/hr) 625 (0.2)    Total Output 625    Net +2200.2         Urine Occurrence 1 x     Foley:    A:  Elevated creatinine: likely chronic renal insufficiency. Creatinine at baseline from previous admission in 04/2012 Anion gap acidosis: likely from lactic acidosis given elevated lactic acid: 10.5 P:    - CK not impressively elevated, but given that patient was down for a long period of time, follow up am ck to exclude rhabdo F/u BMP  GASTROINTESTINAL  Lab 06/18/12 2135 06/18/12 1418  AST -- 657*  ALT -- 545*  ALKPHOS -- 58  BILITOT 0.9 0.6  PROT -- 6.7  ALBUMIN -- 3.6    A:   GI bleed: fecal occult positive without gross blood.  Elevated transaminases: acute from previous admission. No history of alcohol use.  P:  - f/u GI recommendations: gastritis on EGD, colonoscopy tomorrow - f/u acute hepatitis panel - protonix drip - f/u am CMP - NG tube for gastric lavage - zofran for nausea  HEMATOLOGIC  Lab 06/19/12 0440 06/18/12 1630 06/18/12 1418  HGB 7.5* -- 3.6*  HCT 23.6* -- 13.3*  PLT 58* -- 35*  INR -- 1.73* --  APTT -- 37 --   A:   - Profound  anemia: likely acute on chronic given hemodynamically stable state, from upper GI bleed. S/p 4 u prbc: with appropriate response - thrombocytopenia:likely consumptive, no schistocytes on smear, making TTP unlikely. Receiving FFP and platelet transfusion - coagulopathy: likely from hepatic dysfunction  P:  Anemia:  - q4hr CBCs Thrombocytopenia and coagulopathy: - s/p Vitamin K 10 u - repeat coags after FFP and platelet transfusion - normal fibrinogen, follow up factor VIII  INFECTIOUS  Lab 06/19/12 0440 06/18/12 1418  WBC 8.5 4.9  PROCALCITON -- --   Cultures: 10/24: blood culture >>> 10/24 UA to be collected Antibiotics: none  A:  Lactic acidosis: improved from 10.5 to 5.5 P:   Dc metformin  ENDOCRINE A:  Type 2 diabetic on glipizide and metformin at home. P:   ISS Metformin to not be started back on discharge given elevated Cr  NEUROLOGIC  A:  S/p fall Head CT: negative Cervical spine: Trace retropharyngeal effusion. nonspecific but sometimes is a sign of cervical anterior ligamentous injury: nothing to do per neurosurgery given normal neck exam  P:   Supportive care, monitor neuro status  BEST PRACTICE / DISPOSITION Level of Care:  ICU Primary Service:  CCM Consultants:  GI Code Status:  full Diet:  Liquid diet DVT Px:  SCD's GI Px:  protonix gtt Skin Integrity:  normal Social / Family:  No family at bedside  Marena Chancy, PGY-2  Family Medicine Resident  06/19/2012, 7:40 AM  Reviewed above, examined pt, and agree with assessment/plan.  No obvious source for GI bleeding from EGD.  Plan for colonscopy for 10/26.  Transfuse as needed for bleeding or Hb < 7.    Coralyn Helling, MD [facility] Pulmonary/Critical Care 06/19/2012, 3:37 PM Pager:  [phone] After 3pm call: [phone]

## 2012-06-20 ENCOUNTER — Inpatient Hospital Stay (HOSPITAL_COMMUNITY): Payer: Medicare Other

## 2012-06-20 LAB — PREPARE FRESH FROZEN PLASMA: Unit division: 0

## 2012-06-20 LAB — CBC
HCT: 23.8 % — ABNORMAL LOW (ref 39.0–52.0)
HCT: 23.2 % — ABNORMAL LOW (ref 39.0–52.0)
Hemoglobin: 7.4 g/dL — ABNORMAL LOW (ref 13.0–17.0)
MCH: 23.9 pg — ABNORMAL LOW (ref 26.0–34.0)
MCH: 24.1 pg — ABNORMAL LOW (ref 26.0–34.0)
MCHC: 31.1 g/dL (ref 30.0–36.0)
MCV: 76.8 fL — ABNORMAL LOW (ref 78.0–100.0)
MCV: 77.5 fL — ABNORMAL LOW (ref 78.0–100.0)
Platelets: 74 10*3/uL — ABNORMAL LOW (ref 150–400)
Platelets: 73 10*3/uL — ABNORMAL LOW (ref 150–400)
RBC: 3.01 MIL/uL — ABNORMAL LOW (ref 4.22–5.81)
RBC: 3.2 MIL/uL — ABNORMAL LOW (ref 4.22–5.81)
RDW: 24.9 % — ABNORMAL HIGH (ref 11.5–15.5)
RDW: 25.2 % — ABNORMAL HIGH (ref 11.5–15.5)
WBC: 7.5 10*3/uL (ref 4.0–10.5)

## 2012-06-20 LAB — GLUCOSE, CAPILLARY
Glucose-Capillary: 121 mg/dL — ABNORMAL HIGH (ref 70–99)
Glucose-Capillary: 131 mg/dL — ABNORMAL HIGH (ref 70–99)
Glucose-Capillary: 109 mg/dL — ABNORMAL HIGH (ref 70–99)
Glucose-Capillary: 130 mg/dL — ABNORMAL HIGH (ref 70–99)

## 2012-06-20 LAB — COMPREHENSIVE METABOLIC PANEL
ALT: 1190 U/L — ABNORMAL HIGH (ref 0–53)
ALT: 1030 U/L — ABNORMAL HIGH (ref 0–53)
AST: 739 U/L — ABNORMAL HIGH (ref 0–37)
Albumin: 3.5 g/dL (ref 3.5–5.2)
Albumin: 3.3 g/dL — ABNORMAL LOW (ref 3.5–5.2)
Alkaline Phosphatase: 64 U/L (ref 39–117)
Alkaline Phosphatase: 59 U/L (ref 39–117)
Chloride: 102 mEq/L (ref 96–112)
Chloride: 107 mEq/L (ref 96–112)
Creatinine, Ser: 1.86 mg/dL — ABNORMAL HIGH (ref 0.50–1.35)
Potassium: 3.7 mEq/L (ref 3.5–5.1)
Potassium: 3.9 mEq/L (ref 3.5–5.1)
Sodium: 140 mEq/L (ref 135–145)
Sodium: 143 mEq/L (ref 135–145)
Total Bilirubin: 1.2 mg/dL (ref 0.3–1.2)
Total Protein: 6.1 g/dL (ref 6.0–8.3)

## 2012-06-20 LAB — TYPE AND SCREEN
Unit division: 0
Unit division: 0

## 2012-06-20 LAB — HAPTOGLOBIN: Haptoglobin: 168 mg/dL (ref 45–215)

## 2012-06-20 LAB — LACTATE DEHYDROGENASE: LDH: 714 U/L — ABNORMAL HIGH (ref 94–250)

## 2012-06-20 LAB — PREPARE PLATELET PHERESIS: Unit division: 0

## 2012-06-20 MED ORDER — PANTOPRAZOLE SODIUM 40 MG IV SOLR
40.0000 mg | Freq: Two times a day (BID) | INTRAVENOUS | Status: DC
  Administered 2012-06-20 – 2012-06-26 (×12): 40 mg via INTRAVENOUS
  Filled 2012-06-20 (×16): qty 40

## 2012-06-20 MED ORDER — KCL IN DEXTROSE-NACL 20-5-0.45 MEQ/L-%-% IV SOLN
INTRAVENOUS | Status: DC
  Administered 2012-06-20 (×2): via INTRAVENOUS
  Filled 2012-06-20 (×5): qty 1000

## 2012-06-20 MED ORDER — PEG 3350-KCL-NA BICARB-NACL 420 G PO SOLR
4000.0000 mL | Freq: Once | ORAL | Status: AC
  Administered 2012-06-20: 4000 mL via ORAL
  Filled 2012-06-20: qty 4000

## 2012-06-20 NOTE — Progress Notes (Signed)
Subjective: I was unable to do a colonoscopy today as the patient refused to drink his prep last night. He is having loose BM's but still not clear. Denies any active GI complaints at the moment.  Objective: Vital signs in last 24 hours: Temp:  [97.3 F (36.3 C)-98.2 F (36.8 C)] 97.9 F (36.6 C) (10/26 1552) Pulse Rate:  [63-78] 73  (10/26 1400) Resp:  [12-21] 16  (10/26 1400) BP: (108-169)/(55-87) 153/76 mmHg (10/26 1400) SpO2:  [95 %-100 %] 97 % (10/26 1400) Weight:  [112 kg (246 lb 14.6 oz)] 112 kg (246 lb 14.6 oz) (10/26 0400)    Intake/Output from previous day: 10/25 0701 - 10/26 0700 In: 3142 [P.O.:2300; I.V.:840; IV Piggyback:2] Out: 1435 [Urine:1035; Emesis/NG output:400] Intake/Output this shift: Total I/O In: 522.5 [I.V.:522.5] Out: 280 [Urine:280]  General appearance: cooperative, appears stated age, mild distress and moderately obese Resp: clear to auscultation bilaterally Cardio: regular rate and rhythm, S1, S2 normal, no murmur, click, rub or gallop GI: soft, non-tender; bowel sounds normal; no masses,  no organomegaly Extremities: extremities normal, atraumatic, no cyanosis or edema and edema   Lab Results:  [facility] 06/20/12 0800 06/20/12 0036 06/19/12 2111  WBC 7.5 8.2 7.5  HGB 7.3* 7.4* 7.7*  HCT 23.2* 23.8* 24.0*  PLT 74* 67* <5*   BMET  Basename 06/20/12 0800 06/20/12 0036 06/19/12 0933  NA 143 140 141  K 3.9 3.7 3.7  CL 107 102 103  CO2 28 27 27   GLUCOSE 95 123* 113*  BUN 36* 39* 43*  CREATININE 1.75* 1.86* 1.98*  CALCIUM 8.6 9.0 9.2   LFT  Basename 06/20/12 0800 06/18/12 2135  PROT 6.1 --  ALBUMIN 3.3* --  AST 448* --  ALT 1030* --  ALKPHOS 59 --  BILITOT 1.0 --  BILIDIR -- 0.2  IBILI -- 0.7   PT/INR  Basename 06/19/12 0933 06/18/12 1630  LABPROT 17.8* 19.7*  INR 1.51* 1.73*   Studies/Results: Dg Chest Port 1 View  06/20/2012  *RADIOLOGY REPORT*  Clinical Data: Monitor for pulmonary edema.  PORTABLE CHEST - 1 VIEW   Comparison: 06/19/2012  Findings: Shallow inspiration.  Cardiac enlargement with mild pulmonary vascular congestion.  No significant infiltration to suggest edema.  Obscuration of the left hemidiaphragm likely due to pleural effusion with basilar infiltration or atelectasis.  No pneumothorax.  Stable appearance since previous study.  IMPRESSION: Cardiac enlargement with mild pulmonary vascular congestion.  No edema.  Probable small left pleural effusion with basilar atelectasis or infiltration.   Original Report Authenticated By: Marlon Pel, M.D.    Dg Chest Port 1 View  06/19/2012  *RADIOLOGY REPORT*  Clinical Data: Shortness of breath.  Question pulmonary edema. Status post blood transfusion.  PORTABLE CHEST - 1 VIEW  Comparison: Portable chest 06/18/2012.  Findings: The patient has a new NG tube in place.  The tube courses into the stomach and below the inferior margin of film.  There is cardiomegaly but no pulmonary edema.  Left basilar atelectasis persist. No pneumothorax is identified.  IMPRESSION: Cardiomegaly without edema.  Left basilar atelectasis.   Original Report Authenticated By: Bernadene Bell. Maricela Curet, M.D.    Medications: I have reviewed the patient's current medications.  Assessment/Plan: 1) Anemia with an essentially unrevealing EGD; will reprep for a colonoscopy tomorrow as discussed with his nurse.   LOS: 2 days   Atanacio Melnyk 06/20/2012, 5:41 PM

## 2012-06-20 NOTE — Progress Notes (Signed)
Name: Tejas Seawood MRN: [id number] DOB: 09/07/1933    LOS: 2  Referring Provider:  EDP Reason for Referral:  Severe anemia, found down  PULMONARY / CRITICAL CARE MEDICINE  Brief patient description:   78 yo male with DM2, HTN, ?narcolepsy who was admitted after fall and inability to get help for 2 days. Found to have  hemoglobin of 3.6 from GI bleed.   Events Since Admission: Transfused 4u pRBC's, 2u FFP and 1u platelet EGD done this am: findings: gastritis  Tests: 10/25 Echo >> EF 40 to 45%, mod LA dilation, PAS 37 mmHg  Current Status: Nauseous overnight.  Denies abd pain.  Feels hungry.  Vital Signs: Temp:  [97.3 F (36.3 C)-98.4 F (36.9 C)] 97.9 F (36.6 C) (10/26 0416) Pulse Rate:  [64-84] 66  (10/26 0700) Resp:  [12-22] 12  (10/26 0700) BP: (108-181)/(55-99) 151/68 mmHg (10/26 0700) SpO2:  [94 %-100 %] 99 % (10/26 0700) Weight:  [246 lb 14.6 oz (112 kg)] 246 lb 14.6 oz (112 kg) (10/26 0400)  Physical Examination: General - no distress HEENT - no sinus tenderness Cardiac - s1s2 regular, no murmur Chest - no wheeze Abd - soft, nontender Ext - no edema Neuro - normal strength  Lab Results  Component Value Date   WBC 7.5 06/20/2012   HGB 7.3* 06/20/2012   HCT 23.2* 06/20/2012   MCV 77.1* 06/20/2012   PLT 74* 06/20/2012   Lab Results  Component Value Date   CREATININE 1.75* 06/20/2012   BUN 36* 06/20/2012   NA 143 06/20/2012   K 3.9 06/20/2012   CL 107 06/20/2012   CO2 28 06/20/2012   Lab Results  Component Value Date   ALT PENDING 06/20/2012   AST 448* 06/20/2012   ALKPHOS 59 06/20/2012   BILITOT 1.0 06/20/2012    Dg Chest 1 View  06/18/2012  *RADIOLOGY REPORT*  Clinical Data: Larey Seat in bathroom, shortness of breath when recumbent for pelvic radiograph, history hypertension, smoking, diabetes  CHEST - 1 VIEW  Comparison: 08/01/2009  Findings: Enlargement of cardiac silhouette with pulmonary vascular congestion. Decreased lung volumes with  mild bibasilar atelectasis. Atherosclerotic calcification aorta. No definite infiltrate, pleural effusion, or pneumothorax. No fractures identified.  IMPRESSION: Enlargement of cardiac silhouette. Bibasilar atelectasis.   Original Report Authenticated By: Lollie Marrow, M.D.    Dg Pelvis 1-2 Views  06/18/2012  *RADIOLOGY REPORT*  Clinical Data: Larey Seat in bathroom, generalized pain and stiffness in both hips  PELVIS - 1-2 VIEW  Comparison: None  Findings: Bones appear mildly demineralized. Mild bilateral narrowing of the hip joints. Symmetric SI joints. No acute fracture, dislocation, or bone destruction. Small nonspecific focus of sclerosis at the proximal right femoral diaphysis. Scattered atherosclerotic calcifications.  IMPRESSION: No definite acute bony abnormalities.   Original Report Authenticated By: Lollie Marrow, M.D.    Ct Head Wo Contrast  06/18/2012  *RADIOLOGY REPORT*  Clinical Data:  78 year old male status post fall.  CT HEAD WITHOUT CONTRAST CT CERVICAL SPINE WITHOUT CONTRAST  Technique:  Multidetector CT imaging of the head and cervical spine was performed following the standard protocol without intravenous contrast.  Multiplanar CT image reconstructions of the cervical spine were also generated.  Comparison:   None  CT HEAD  Findings: No definite acute scalp soft tissue injury.  No acute orbit soft tissue findings.  Mild paranasal sinus mucosal thickening.  Mastoids are clear.  Calvarium intact.  Scattered dural calcifications. Calcified atherosclerosis at the skull base.  Cerebral volume is within  normal limits for age.  No midline shift, ventriculomegaly, mass effect, evidence of mass lesion, intracranial hemorrhage or evidence of cortically based acute infarction.  Gray-white matter differentiation is within normal limits throughout the brain.  IMPRESSION: 1.  Negative for age noncontrast CT appearance of the brain. 2.  See cervical spine findings below.  CT CERVICAL SPINE  Findings:  Straightening of cervical lordosis. Visualized skull base is intact.  No atlanto-occipital dissociation.  Cervicothoracic junction alignment is within normal limits.  Lower cervical disc space narrowing.  Occasional small lucent lesions in the cervical spine, but no year most appear related to nutrient foramen.  No destructive or suspicious osseous lesion identified.  No acute cervical fracture identified.  There is a trace retropharyngeal effusion.  Partially retropharyngeal course of the left carotid artery.  Carotid calcified atherosclerosis.  Other paraspinal soft tissues are within normal limits.  IMPRESSION: 1. No acute fracture or listhesis identified in the cervical spine. Ligamentous injury is not excluded. 2.  Trace retropharyngeal effusion.  This is nonspecific but sometimes is a sign of cervical anterior ligamentous injury. Cervical MRI would confirm if the patient's cervical spine cannot be cleared clinically.   Original Report Authenticated By: Harley Hallmark, M.D.    Ct Cervical Spine Wo Contrast  06/18/2012  *RADIOLOGY REPORT*  Clinical Data:  78 year old male status post fall.  CT HEAD WITHOUT CONTRAST CT CERVICAL SPINE WITHOUT CONTRAST  Technique:  Multidetector CT imaging of the head and cervical spine was performed following the standard protocol without intravenous contrast.  Multiplanar CT image reconstructions of the cervical spine were also generated.  Comparison:   None  CT HEAD  Findings: No definite acute scalp soft tissue injury.  No acute orbit soft tissue findings.  Mild paranasal sinus mucosal thickening.  Mastoids are clear.  Calvarium intact.  Scattered dural calcifications. Calcified atherosclerosis at the skull base.  Cerebral volume is within normal limits for age.  No midline shift, ventriculomegaly, mass effect, evidence of mass lesion, intracranial hemorrhage or evidence of cortically based acute infarction.  Gray-white matter differentiation is within normal limits  throughout the brain.  IMPRESSION: 1.  Negative for age noncontrast CT appearance of the brain. 2.  See cervical spine findings below.  CT CERVICAL SPINE  Findings: Straightening of cervical lordosis. Visualized skull base is intact.  No atlanto-occipital dissociation.  Cervicothoracic junction alignment is within normal limits.  Lower cervical disc space narrowing.  Occasional small lucent lesions in the cervical spine, but no year most appear related to nutrient foramen.  No destructive or suspicious osseous lesion identified.  No acute cervical fracture identified.  There is a trace retropharyngeal effusion.  Partially retropharyngeal course of the left carotid artery.  Carotid calcified atherosclerosis.  Other paraspinal soft tissues are within normal limits.  IMPRESSION: 1. No acute fracture or listhesis identified in the cervical spine. Ligamentous injury is not excluded. 2.  Trace retropharyngeal effusion.  This is nonspecific but sometimes is a sign of cervical anterior ligamentous injury. Cervical MRI would confirm if the patient's cervical spine cannot be cleared clinically.   Original Report Authenticated By: Harley Hallmark, M.D.    Dg Chest Port 1 View  06/20/2012  *RADIOLOGY REPORT*  Clinical Data: Monitor for pulmonary edema.  PORTABLE CHEST - 1 VIEW  Comparison: 06/19/2012  Findings: Shallow inspiration.  Cardiac enlargement with mild pulmonary vascular congestion.  No significant infiltration to suggest edema.  Obscuration of the left hemidiaphragm likely due to pleural effusion with basilar infiltration or  atelectasis.  No pneumothorax.  Stable appearance since previous study.  IMPRESSION: Cardiac enlargement with mild pulmonary vascular congestion.  No edema.  Probable small left pleural effusion with basilar atelectasis or infiltration.   Original Report Authenticated By: Marlon Pel, M.D.    Dg Chest Port 1 View  06/19/2012  *RADIOLOGY REPORT*  Clinical Data: Shortness of breath.   Question pulmonary edema. Status post blood transfusion.  PORTABLE CHEST - 1 VIEW  Comparison: Portable chest 06/18/2012.  Findings: The patient has a new NG tube in place.  The tube courses into the stomach and below the inferior margin of film.  There is cardiomegaly but no pulmonary edema.  Left basilar atelectasis persist. No pneumothorax is identified.  IMPRESSION: Cardiomegaly without edema.  Left basilar atelectasis.   Original Report Authenticated By: Bernadene Bell. D'ALESSIO, M.D.       ASSESSMENT AND PLAN  GI bleed. No obvious source of bleeding on EGD 10/25. P: Change protonix to BID For colonoscopy 10/26 Resume diet when okay with GI PRN zofran, reglan for nausea  Elevated LFT's. Likely related to shock. P: F/u LFT's  Acute blood lose anemia. P: F/u CBC Transfuse for Hb < 7 or active bleeding  Thrombocytopenia. P: F/u CBC  PAF on admission. Likely from demand ischemia related to profound anemia.  Sinus rhythm now. P: Monitor on tele  Hx of HTN, hyperlipidemia. P: Continue lopressor, pravastatin Hold lisinopril, lasix, HCTZ with renal dysfx  Systolic dysfx ?acutity. P: Will need cardiology at some point Continue lopressor Hold ASA with GI bleeding  Acute renal failure. Likely 2nd to volume depletion.  Improving. P: F/u renal fx, urine outpt  Hx of BPH. P: Resume hytrin when able to take oral meds  Anion gap acidosis with elevated lactate. From tissue hypoperfusion in setting of severe anemia.  Resolved.  Type 2 DM. P: SSI Avoid metformin use with renal dysfx  Syncope Likely related to profound anemia. P: Monitor clinically  Hx of depression.. P: Hold paxil until able to take oral meds   BEST PRACTICE / DISPOSITION Level of Care:  ICU Primary Service:  CCM Consultants:  GI Code Status:  full Diet: NPO DVT Px:  SCD's GI Px:  protonix    If stable after colonoscopy, then can likely transfer out of ICU.  Once transfer out of ICU will  ask [facility] to assume primary care.  Coralyn Helling, MD [facility] Pulmonary/Critical Care 06/20/2012, 8:54 AM Pager:  [phone] After 3pm call: [phone]

## 2012-06-21 ENCOUNTER — Encounter (HOSPITAL_COMMUNITY)
Admission: EM | Disposition: A | Discharge status: Skilled Nursing Facility | Payer: Self-pay | Source: Home / Self Care | Attending: Family Medicine

## 2012-06-21 DIAGNOSIS — I10 Essential (primary) hypertension: Secondary | ICD-10-CM

## 2012-06-21 HISTORY — PX: COLONOSCOPY: SHX5424

## 2012-06-21 LAB — COMPREHENSIVE METABOLIC PANEL
Albumin: 3.3 g/dL — ABNORMAL LOW (ref 3.5–5.2)
BUN: 26 mg/dL — ABNORMAL HIGH (ref 6–23)
Chloride: 103 mEq/L (ref 96–112)
Creatinine, Ser: 1.54 mg/dL — ABNORMAL HIGH (ref 0.50–1.35)
Total Bilirubin: 1 mg/dL (ref 0.3–1.2)

## 2012-06-21 LAB — CBC
HCT: 24.1 % — ABNORMAL LOW (ref 39.0–52.0)
Platelets: 101 10*3/uL — ABNORMAL LOW (ref 150–400)
RDW: 25.6 % — ABNORMAL HIGH (ref 11.5–15.5)
WBC: 6.1 10*3/uL (ref 4.0–10.5)

## 2012-06-21 LAB — GLUCOSE, CAPILLARY
Glucose-Capillary: 112 mg/dL — ABNORMAL HIGH (ref 70–99)
Glucose-Capillary: 149 mg/dL — ABNORMAL HIGH (ref 70–99)

## 2012-06-21 SURGERY — COLONOSCOPY
Anesthesia: Moderate Sedation

## 2012-06-21 MED ORDER — MIDAZOLAM HCL 5 MG/5ML IJ SOLN
INTRAMUSCULAR | Status: DC | PRN
  Administered 2012-06-21 (×2): 2 mg via INTRAVENOUS

## 2012-06-21 MED ORDER — FENTANYL CITRATE 0.05 MG/ML IJ SOLN
INTRAMUSCULAR | Status: AC
  Filled 2012-06-21: qty 4

## 2012-06-21 MED ORDER — FLEET ENEMA 7-19 GM/118ML RE ENEM
2.0000 | ENEMA | RECTAL | Status: AC
  Administered 2012-06-21: 2 via RECTAL
  Filled 2012-06-21: qty 2

## 2012-06-21 MED ORDER — FENTANYL CITRATE 0.05 MG/ML IJ SOLN
INTRAMUSCULAR | Status: DC | PRN
  Administered 2012-06-21 (×2): 25 ug via INTRAVENOUS

## 2012-06-21 MED ORDER — DIPHENHYDRAMINE HCL 50 MG/ML IJ SOLN
INTRAMUSCULAR | Status: AC
  Filled 2012-06-21: qty 1

## 2012-06-21 MED ORDER — MIDAZOLAM HCL 5 MG/ML IJ SOLN
INTRAMUSCULAR | Status: AC
  Filled 2012-06-21: qty 3

## 2012-06-21 MED ORDER — SODIUM CHLORIDE 0.9 % IV SOLN: INTRAVENOUS | Status: DC

## 2012-06-21 NOTE — Progress Notes (Signed)
Verbal pre sed eval from Dr. Loreta Ave due to input complications with epic

## 2012-06-21 NOTE — Progress Notes (Signed)
Patient angry because he cannot have solid foods yet. Discussed preliminary  results of colonoscopy with patient and daughter. Verbalized understanding that he may need to have surgery.

## 2012-06-21 NOTE — Progress Notes (Signed)
Name: [name] MRN: [id number] DOB: 07-20-1934    LOS: 3  Referring Provider:  EDP Reason for Referral:  Severe anemia, found down  PULMONARY / CRITICAL CARE MEDICINE  Brief patient description:   78 yo male with DM2, HTN, ?narcolepsy who was admitted after fall and inability to get help for 2 days. Found to have  hemoglobin of 3.6 from GI bleed.   Events Since Admission: Transfused 4u pRBC's, 2u FFP and 1u platelet EGD done this am: findings: gastritis  Tests: 10/25 Echo >> EF 40 to 45%, mod LA dilation, PAS 37 mmHg  Current Status: Denies chest/abd pain.  For colonoscopy today.  Vital Signs: Temp:  [97.4 F (36.3 C)-98.6 F (37 C)] 98.4 F (36.9 C) (10/27 0746) Pulse Rate:  [63-80] 66  (10/27 0700) Resp:  [1-20] 1  (10/27 0700) BP: (136-165)/(64-86) 150/76 mmHg (10/27 0700) SpO2:  [97 %-100 %] 98 % (10/27 0700) Weight:  [259 lb 7.7 oz (117.7 kg)] 259 lb 7.7 oz (117.7 kg) (10/27 0451)  Physical Examination: General - no distress HEENT - no sinus tenderness Cardiac - s1s2 regular, no murmur Chest - no wheeze Abd - soft, nontender Ext - no edema Neuro - normal strength  Lab Results  Component Value Date   WBC 7.5 06/20/2012   HGB 7.7* 06/20/2012   HCT 24.8* 06/20/2012   MCV 77.5* 06/20/2012   PLT 73* 06/20/2012   Lab Results  Component Value Date   CREATININE 1.54* 06/21/2012   BUN 26* 06/21/2012   NA 138 06/21/2012   K 4.0 06/21/2012   CL 103 06/21/2012   CO2 26 06/21/2012   Lab Results  Component Value Date   ALT 799* 06/21/2012   AST 192* 06/21/2012   ALKPHOS 67 06/21/2012   BILITOT 1.0 06/21/2012    CBG (last 3)   Basename 06/21/12 0727 06/21/12 0432 06/21/12 0004  GLUCAP 112* 122* 122*     Dg Chest Port 1 View  06/20/2012  *RADIOLOGY REPORT*  Clinical Data: Monitor for pulmonary edema.  PORTABLE CHEST - 1 VIEW  Comparison: 06/19/2012  Findings: Shallow inspiration.  Cardiac enlargement with mild pulmonary vascular congestion.   No significant infiltration to suggest edema.  Obscuration of the left hemidiaphragm likely due to pleural effusion with basilar infiltration or atelectasis.  No pneumothorax.  Stable appearance since previous study.  IMPRESSION: Cardiac enlargement with mild pulmonary vascular congestion.  No edema.  Probable small left pleural effusion with basilar atelectasis or infiltration.   Original Report Authenticated By: Marlon Pel, M.D.     ASSESSMENT AND PLAN  GI bleed. No obvious source of bleeding on EGD 10/25. P: Change protonix to BID For colonoscopy 10/27 Resume diet when okay with GI PRN zofran, reglan for nausea  Elevated LFT's. Likely related to shock.  Improving. P: F/u LFT's  Acute blood lose anemia. P: F/u CBC Transfuse for Hb < 7 or active bleeding  Thrombocytopenia. P: F/u CBC  PAF on admission. Likely from demand ischemia related to profound anemia.  Sinus rhythm now. P: Monitor on tele  Hx of HTN, hyperlipidemia. P: Continue lopressor, pravastatin Hold lisinopril, lasix, HCTZ with renal dysfx  Systolic dysfx ?acutity. P: Will need cardiology at some point Continue lopressor Hold ASA with GI bleeding  Acute renal failure. Likely 2nd to volume depletion.  Improving. P: F/u renal fx, urine outpt  Hx of BPH. P: Resume hytrin when able to take oral meds  Anion gap acidosis with elevated lactate. From tissue hypoperfusion  in setting of severe anemia.  Resolved.  Type 2 DM. P: SSI Avoid metformin use with renal dysfx  Syncope Likely related to profound anemia. P: Monitor clinically  Hx of depression.. P: Hold paxil until able to take oral meds   BEST PRACTICE / DISPOSITION Level of Care:  ICU Primary Service:  CCM Consultants:  GI Code Status:  full Diet: NPO DVT Px:  SCD's GI Px:  protonix    If stable after colonoscopy, then can likely transfer out of ICU.  Once transfer out of ICU will ask [facility] to assume primary  care.  Coralyn Helling, MD [facility] Pulmonary/Critical Care 06/21/2012, 8:52 AM Pager:  [phone] After 3pm call: [phone]

## 2012-06-21 NOTE — Op Note (Signed)
[facility] [address] [place], [address]   OPERATIVE PROCEDURE REPORT  PATIENT: [name], [name]  MR#: [id number] BIRTHDATE: [birth year] GENDER: Male ENDOSCOPIST: Dr.  Lorenza Burton, MD ASSISTANT:   Felecia Shelling, RN and Windell Hummingbird, technician,. PROCEDURE DATE: 06/21/2012  PRE-PROCEDURE PREPARATION: The patient was prepped with 2 dulcolax tablets, one, ten ounce, bottle of magnesium citrate and a gallon of Nulytley the night prior to the procedure.  The patient was fasted for 8 hours prior to the procedure. PRE-PROCEDURE PHYSICAL: Patient has stable vital signs.  Neck is supple.  There is no JVD, thyromegaly or LAD.  Chest clear to auscultation.  S1 and S2 regular.  Abdomen soft, morbidly obese, non-distended, non-tender with NABS. PROCEDURE:     Colonoscopy with hot snare polypectomy  x 4 and multiple biopsies. ASA CLASS:     Class IV INDICATIONS:     1.  iron deficiency anemia 2) Colorectal cancer screening. MEDICATIONS:     Fentanyl 50 mcg and Versed 4 mg IV  DESCRIPTION OF PROCEDURE: After the risks, benefits, and alternatives of the procedure were thoroughly explained [including a 10% missed rate of cancer and polyps], informed consent was obtained.  Digital rectal exam was performed. The Colonoscope [id number]  was introduced through the anus  and advanced to the cecum, which was identified by the ileocecal valve , limited by No adverse events experienced.   The quality of the prep was fair. . Multiple washes were done. Small lesions could be missed. The instrument was then slowly withdrawn as the colon was fully examined.     COLON FINDINGS: Two sessile, dimunitive polyps were found in the rectum and removed by cold biopsies x 3. Three transverse colon polyps, 2 measuring from 5-6 mm and one measuring 7 mm, were removed by hot snare x 3 [150/15]. A large multilobulated villous mass was noted in the cecum and multiple biopsies were  done. Another 5 mm sessile right colon polyp was removed by a hot snare x 1 [150/15] as well. The rest of the colonic mucosa appeared healthy with a normal vascular pattern. No diverticula or AVMs were noted. The appendiceal orifice was not identified but the ICV were identified and photographed. Retroflexed views revealed no abnormalities.  The patient tolerated the procedure without immediate complications.  The scope was then withdrawn from the patient and the procedure terminated.  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 2) Multiple polyps removed [see description above].   RECOMMENDATIONS:     1.  Await pathology results. 2.  Hold all NSAIDS for now.  REPEAT EXAM:      Will be planned depending on the final pathology report.   CPT CODES:     [id number]   DIAGNOSIS CODES:     280.9, V76.51   REFERRED NF:AOZHYQ Craige Cotta, M.D.  eSigned:  Dr. Lorenza Burton, MD 06/21/2012 2:03 PM   PATIENT NAME:  [name], [name] MR#: [id number]

## 2012-06-22 ENCOUNTER — Encounter (HOSPITAL_COMMUNITY): Payer: Self-pay | Admitting: Gastroenterology

## 2012-06-22 ENCOUNTER — Inpatient Hospital Stay (HOSPITAL_COMMUNITY): Payer: Medicare Other

## 2012-06-22 ENCOUNTER — Encounter (HOSPITAL_COMMUNITY)
Admission: EM | Disposition: A | Discharge status: Skilled Nursing Facility | Payer: Self-pay | Source: Home / Self Care | Attending: Family Medicine

## 2012-06-22 LAB — COMPREHENSIVE METABOLIC PANEL
AST: 57 U/L — ABNORMAL HIGH (ref 0–37)
Albumin: 3.1 g/dL — ABNORMAL LOW (ref 3.5–5.2)
Alkaline Phosphatase: 67 U/L (ref 39–117)
BUN: 15 mg/dL (ref 6–23)
CO2: 26 mEq/L (ref 19–32)
Chloride: 106 mEq/L (ref 96–112)
Creatinine, Ser: 1.34 mg/dL (ref 0.50–1.35)
GFR calc non Af Amer: 50 mL/min — ABNORMAL LOW (ref >90)
Potassium: 4 mEq/L (ref 3.5–5.1)
Total Bilirubin: 1.2 mg/dL (ref 0.3–1.2)

## 2012-06-22 LAB — GLUCOSE, CAPILLARY
Glucose-Capillary: 96 mg/dL (ref 70–99)
Glucose-Capillary: 97 mg/dL (ref 70–99)
Glucose-Capillary: 166 mg/dL — ABNORMAL HIGH (ref 70–99)
Glucose-Capillary: 155 mg/dL — ABNORMAL HIGH (ref 70–99)

## 2012-06-22 LAB — IRON AND TIBC
Iron: 12 ug/dL — ABNORMAL LOW (ref 42–135)
TIBC: 372 ug/dL (ref 215–435)

## 2012-06-22 LAB — HEPATITIS PANEL, ACUTE: HCV Ab: NEGATIVE

## 2012-06-22 LAB — FERRITIN: Ferritin: 21 ng/mL — ABNORMAL LOW (ref 22–322)

## 2012-06-22 SURGERY — COLONOSCOPY
Anesthesia: Moderate Sedation

## 2012-06-22 MED ORDER — TERAZOSIN HCL 5 MG PO CAPS
5.0000 mg | ORAL_CAPSULE | Freq: Every day | ORAL | Status: DC
  Administered 2012-06-22 – 2012-06-23 (×2): 5 mg via ORAL
  Filled 2012-06-22 (×4): qty 1

## 2012-06-22 MED ORDER — FENTANYL CITRATE 0.05 MG/ML IJ SOLN
25.0000 ug | INTRAMUSCULAR | Status: DC | PRN
  Administered 2012-06-23: 25 ug via INTRAVENOUS
  Filled 2012-06-22: qty 2

## 2012-06-22 MED ORDER — LABETALOL HCL 5 MG/ML IV SOLN
20.0000 mg | INTRAVENOUS | Status: DC | PRN
  Administered 2012-06-22: 20 mg via INTRAVENOUS
  Filled 2012-06-22 (×2): qty 4

## 2012-06-22 NOTE — Progress Notes (Signed)
eLink Physician-Brief Progress Note Patient Name: [name] DOB: 12/21/1933 MRN: [id number]  Date of Service  06/22/2012   HPI/Events of Note   RN reports BP >180 SBP  eICU Interventions  IV labetalol ordered prn    Intervention Category Major Interventions: Hypertension - evaluation and management  Shan Levans 06/22/2012, 9:34 PM

## 2012-06-22 NOTE — Evaluation (Signed)
Physical Therapy Evaluation Patient Details Name: [name] MRN: [id number] DOB: Dec 13, 1933 Today's Date: 06/22/2012 Time: 1478-2956 PT Time Calculation (min): 21 min  PT Assessment / Plan / Recommendation Clinical Impression  pt rpesents with GIB and ? Cancerous Mass.  pt had been found on floor for 2 days with Hgb of 3.6.  pt very weak and debilitated, but motivated to improve strength.  Would benefit from ST-SNF at D/C.      PT Assessment  Patient needs continued PT services    Follow Up Recommendations  Post acute inpatient    Does the patient have the potential to tolerate intense rehabilitation   No, Recommend SNF  Barriers to Discharge None      Equipment Recommendations  None recommended by OT    Recommendations for Other Services     Frequency Min 3X/week    Precautions / Restrictions Precautions Precautions: Fall Restrictions Weight Bearing Restrictions: No   Pertinent Vitals/Pain Denies pain.      Mobility  Bed Mobility Bed Mobility: Supine to Sit;Sitting - Scoot to Edge of Bed Supine to Sit: 3: Mod assist Sitting - Scoot to Edge of Bed: 3: Mod assist Details for Bed Mobility Assistance: cues for sequencing, safe technique, encouragement.   Transfers Transfers: Sit to Stand;Stand to Sit;Stand Pivot Transfers Sit to Stand: 1: +2 Total assist;With upper extremity assist;From bed Sit to Stand: Patient Percentage: 60% Stand to Sit: 1: +2 Total assist;With upper extremity assist;To chair/3-in-1 Stand to Sit: Patient Percentage: 60% Stand Pivot Transfers: 1: +2 Total assist Stand Pivot Transfers: Patient Percentage: 60% Details for Transfer Assistance: cues for sequencing, safe technique, anterior wt shift with sit to stand, UE use.   Ambulation/Gait Ambulation/Gait Assistance: Not tested (comment) Stairs: No Wheelchair Mobility Wheelchair Mobility: No    Shoulder Instructions     Exercises     PT Diagnosis: Difficulty walking;Generalized  weakness  PT Problem List: Decreased strength;Decreased activity tolerance;Decreased balance;Decreased mobility;Decreased knowledge of use of DME PT Treatment Interventions: DME instruction;Gait training;Stair training;Functional mobility training;Therapeutic activities;Therapeutic exercise;Balance training;Patient/family education   PT Goals Acute Rehab PT Goals PT Goal Formulation: With patient Time For Goal Achievement: 07/06/12 Potential to Achieve Goals: Good Pt will go Supine/Side to Sit: with supervision PT Goal: Supine/Side to Sit - Progress: Goal set today Pt will go Sit to Supine/Side: with supervision PT Goal: Sit to Supine/Side - Progress: Goal set today Pt will go Sit to Stand: with min assist PT Goal: Sit to Stand - Progress: Goal set today Pt will go Stand to Sit: with min assist PT Goal: Stand to Sit - Progress: Goal set today Pt will Ambulate: 16 - 50 feet;with min assist;with rolling walker PT Goal: Ambulate - Progress: Goal set today  Visit Information  Last PT Received On: 06/22/12 Assistance Needed: +2 PT/OT Co-Evaluation/Treatment: Yes    Subjective Data  Subjective: "I'm 260lbs.  I don't know if you guys can lift me." Patient Stated Goal: Stronger   Prior Functioning  Home Living Lives With: Alone Type of Home: Apartment Home Access: Level entry Home Layout: One level Bathroom Shower/Tub: Engineer, manufacturing systems: Standard Bathroom Accessibility: No Home Adaptive Equipment: Straight cane;Grab bars in shower Prior Function Level of Independence: Independent with assistive device(s) (with difficulty) Able to Take Stairs?: No Driving: Yes Vocation: Retired Comments: Pt reports he stopped cooking due to fatigue, and used a SPC in the community Communication Communication: No difficulties Dominant Hand: Right    Cognition  Overall Cognitive Status: Appears within functional  limits for tasks assessed/performed Arousal/Alertness:  Awake/alert Orientation Level: Appears intact for tasks assessed Behavior During Session: [facility] for tasks performed Cognition - Other Comments: Pt is a bit slow to process info, anticipate this is his baseline    Extremity/Trunk Assessment Right Upper Extremity Assessment RUE ROM/Strength/Tone: Deficits RUE ROM/Strength/Tone Deficits: shoulder 2/5; elbow 3/5 RUE Coordination: Deficits RUE Coordination Deficits: due to weakness Left Upper Extremity Assessment LUE ROM/Strength/Tone: Deficits LUE ROM/Strength/Tone Deficits: shoulder 2/5; elbow 3/5 LUE Coordination: Deficits LUE Coordination Deficits: due to weakness Right Lower Extremity Assessment RLE ROM/Strength/Tone: Deficits RLE ROM/Strength/Tone Deficits: AROM WFL, Strength grossly 4-/5 RLE Sensation: WFL - Light Touch Left Lower Extremity Assessment LLE ROM/Strength/Tone: Deficits LLE ROM/Strength/Tone Deficits: AROM WFL, Strength grossly 4-/5 LLE Sensation: WFL - Light Touch Trunk Assessment Trunk Assessment: Normal   Balance Balance Balance Assessed: Yes Static Sitting Balance Static Sitting - Balance Support: Bilateral upper extremity supported;Feet supported Static Sitting - Level of Assistance: 5: Stand by assistance Static Standing Balance Static Standing - Balance Support: Bilateral upper extremity supported Static Standing - Level of Assistance: 1: +2 Total assist Static Standing - Comment/# of Minutes: cues for upright posture, pt tends to have posterior lean.    End of Session PT - End of Session Equipment Utilized During Treatment: Gait belt Activity Tolerance: Patient tolerated treatment well Patient left: in chair;with call bell/phone within reach Nurse Communication: Mobility status  GP     Sunny Schlein, [place] [phone] 06/22/2012, 2:11 PM

## 2012-06-22 NOTE — Care Management Note (Signed)
    Page 1 of 1   06/22/2012     10:35:24 AM   CARE MANAGEMENT NOTE 06/22/2012  Patient:  [name], [name]   Account Number:  [id number]  Date Initiated:  06/19/2012  Documentation initiated by:  [facility]  Subjective/Objective Assessment:   GIB - increased liver enzymes  lives alone - has daughter.     Action/Plan:   Anticipated DC Date:  06/22/2012   Anticipated DC Plan:  HOME W HOME HEALTH SERVICES      DC Planning Services  CM consult      Choice offered to / List presented to:             Status of service:  In process, will continue to follow Medicare Important Message given?   (If response is "NO", the following Medicare IM given date fields will be blank) Date Medicare IM given:   Date Additional Medicare IM given:    Discharge Disposition:    Per UR Regulation:  Reviewed for med. necessity/level of care/duration of stay  If discussed at Long Length of Stay Meetings, dates discussed:    Comments:  ContactDmauri, [place] Daughter [phone]  06-22-12 10:30am Avie Arenas, RNBSN [phone] Talked with patient.  Awake and alert.  States realy weak. Confirmed he does live at home along and states daughter wants nothing to do with him.  Interested in rehab - may need something long term.  Does not want a nursing home long term where he is confined.  SW consulted.

## 2012-06-22 NOTE — Progress Notes (Signed)
Name: Torris Matsuoka MRN: [id number] DOB: August 23, 1934    LOS: 4  Referring Provider:  EDP Reason for Referral:  Severe anemia, found down  PULMONARY / CRITICAL CARE MEDICINE  Brief patient description:   78 yo male with DM2, HTN, ?narcolepsy who was admitted after fall and inability to get help for 2 days. Found to have  hemoglobin of 3.6 from GI bleed.   Events Since Admission: Transfused 4u pRBC's, 2u FFP and 1u platelet EGD done this am: findings: gastritis  Tests: 10/25 Echo >> EF 40 to 45%, mod LA dilation, PAS 37 mmHg  Current Status: Denies chest/abd pain. C/o gen weakness  Vital Signs: Temp:  [98.1 F (36.7 C)-98.8 F (37.1 C)] 98.1 F (36.7 C) (10/28 1243) Pulse Rate:  [68-86] 77  (10/28 1326) Resp:  [13-21] 13  (10/28 0900) BP: (140-169)/(65-104) 169/88 mmHg (10/28 1326) SpO2:  [95 %-100 %] 98 % (10/28 1326) Weight:  [262 lb 12.6 oz (119.2 kg)] 262 lb 12.6 oz (119.2 kg) (10/28 0500)  Physical Examination: General - no distress HEENT - no sinus tenderness Cardiac - s1s2 regular, no murmur Chest - no wheeze Abd - soft, nontender Ext - no edema Neuro - normal strength  Lab Results  Component Value Date   WBC 6.1 06/21/2012   HGB 7.4* 06/21/2012   HCT 24.1* 06/21/2012   MCV 77.5* 06/21/2012   PLT 101* 06/21/2012   Lab Results  Component Value Date   CREATININE 1.34 06/22/2012   BUN 15 06/22/2012   NA 139 06/22/2012   K 4.0 06/22/2012   CL 106 06/22/2012   CO2 26 06/22/2012   Lab Results  Component Value Date   ALT 486* 06/22/2012   AST 57* 06/22/2012   ALKPHOS 67 06/22/2012   BILITOT 1.2 06/22/2012    CBG (last 3)   Basename 06/22/12 1201 06/22/12 0817 06/22/12 0402  GLUCAP 119* 97 96     No results found.  ASSESSMENT AND PLAN  GI bleed. No obvious source of bleeding on EGD 10/25. Colonoscopy from 10/27:  polypoid cecal lesion -biopsied P: protonix bid Advance to Heart healthy diet PRN zofran, reglan for nausea Cecal mass  will eventually require resection   Elevated LFT's. Likely related to shock.  Improving. P: F/u LFT's  Acute blood loss anemia: s/p 4 u pRBC. CBC stable P: F/u CBC Transfuse for Hb < 7 or active bleeding  Thrombocytopenia: improving P: F/u CBC  PAF on admission. Likely from demand ischemia related to profound anemia.  Sinus rhythm now. P: Monitor on tele  Hx of HTN, hyperlipidemia. P: Continue lopressor Hold lisinopril, lasix, HCTZ with renal dysfx  Systolic dysfx ?acutity. Echo: 10/25: EF: 40-45%, severe hypokinesis of mid-distalanteroseptal myocardium Pos troponins  P: Will need cardiology eval in preparation for future surgery Continue lopressor Hold ASA with GI bleeding  Acute renal failure. Likely 2nd to volume depletion.  Improving. P: F/u renal fx, urine outpt  Hx of BPH. P: Start back hytrin since able to take oral meds  Anion gap acidosis with elevated lactate. From tissue hypoperfusion in setting of severe anemia.  Resolved.  Type 2 DM. P: SSI Avoid metformin use with renal dysfx  Syncope Likely related to profound anemia. P: Monitor clinically  Hx of depression.Marland Kitchen P: paxil discontinued on home MAR. Monitor symptoms  BEST PRACTICE / DISPOSITION Level of Care:  ICU Primary Service:  CCM Consultants:  GI Code Status:  full Diet: heart healthy DVT Px:  SCD's GI Px:  protonix  Marena Chancy, PGY-2  OK to transfer to [facility], [facility] to take over 10/29  Cyril Mourning MD. Tonny Bollman. [place] [facility] Pager [phone] If no response call [phone]

## 2012-06-22 NOTE — Progress Notes (Addendum)
Clinical Social Work Department CLINICAL SOCIAL WORK PLACEMENT NOTE 06/22/2012  Patient:  [name], [name]  Account Number:  [id number] Admit date:  06/18/2012  Clinical Social Worker:  Unk Lightning, LCSW  Date/time:  06/22/2012 11:00 AM  Clinical Social Work is seeking post-discharge placement for this patient at the following level of care:   SKILLED NURSING   (*CSW will update this form in Epic as items are completed)   06/22/2012  Patient/family provided with [facility] Department of Clinical Social Work's list of facilities offering this level of care within the geographic area requested by the patient (or if unable, by the patient's family).  06/22/2012  Patient/family informed of their freedom to choose among providers that offer the needed level of care, that participate in Medicare, Medicaid or managed care program needed by the patient, have an available bed and are willing to accept the patient.  06/22/2012  Patient/family informed of [facility]' ownership interest in [facility], as well as of the fact that they are under no obligation to receive care at this facility.  PASARR submitted to EDS on 06/22/2012 PASARR number received from EDS on 06/22/2012  FL2 transmitted to all facilities in geographic area requested by pt/family on  06/22/2012 FL2 transmitted to all facilities within larger geographic area on   Patient informed that his/her managed care company has contracts with or will negotiate with  certain facilities, including the following:     Patient/family informed of bed offers received:  06/23/12 ([facility]) Patient chooses bed at  Physician recommends and patient chooses bed at    Patient to be transferred to  on   Patient to be transferred to facility by   The following physician request were entered in Epic:   Additional Comments:

## 2012-06-22 NOTE — Progress Notes (Signed)
Subjective: Hungry.  Wants to eat.  Objective: Vital signs in last 24 hours: Temp:  [98.1 F (36.7 C)-98.8 F (37.1 C)] 98.1 F (36.7 C) (10/28 1243) Pulse Rate:  [68-86] 77  (10/28 1326) Resp:  [13-23] 13  (10/28 0900) BP: (140-169)/(65-104) 169/88 mmHg (10/28 1326) SpO2:  [95 %-100 %] 98 % (10/28 1326) Weight:  [119.2 kg (262 lb 12.6 oz)] 119.2 kg (262 lb 12.6 oz) (10/28 0500) Last BM Date: 06/21/12  Intake/Output from previous day: 10/27 0701 - 10/28 0700 In: 1570 [P.O.:960; I.V.:600; IV Piggyback:10] Out: 945 [Urine:945] Intake/Output this shift: Total I/O In: 120 [P.O.:120] Out: -   General appearance: no distress GI: soft, non-tender; bowel sounds normal; no masses,  no organomegaly  Lab Results:  Basename 06/21/12 0815 06/20/12 2001 06/20/12 0800  WBC 6.1 7.5 7.5  HGB 7.4* 7.7* 7.3*  HCT 24.1* 24.8* 23.2*  PLT 101* 73* 74*   BMET  Basename 06/22/12 1000 06/21/12 0440 06/20/12 0800  NA 139 138 143  K 4.0 4.0 3.9  CL 106 103 107  CO2 26 26 28   GLUCOSE 103* 145* 95  BUN 15 26* 36*  CREATININE 1.34 1.54* 1.75*  CALCIUM 8.5 8.6 8.6   LFT  Basename 06/22/12 1000  PROT 6.1  ALBUMIN 3.1*  AST 57*  ALT 486*  ALKPHOS 67  BILITOT 1.2  BILIDIR --  IBILI --   PT/INR No results found for this basename: LABPROT:2,INR:2 in the last 72 hours Hepatitis Panel  Basename 06/20/12 0036  HEPBSAG NEGATIVE  HCVAB NEGATIVE  HEPAIGM PENDING  HEPBIGM PENDING   C-Diff No results found for this basename: CDIFFTOX:3 in the last 72 hours Fecal Lactopherrin No results found for this basename: FECLLACTOFRN in the last 72 hours  Studies/Results: No results found.  Medications:  Scheduled:   . insulin aspart  0-15 Units Subcutaneous Q4H  . metoprolol tartrate  25 mg Oral BID  . pantoprazole (PROTONIX) IV  40 mg Intravenous Q12H   Continuous:   . DISCONTD: sodium chloride    . DISCONTD: dextrose 5 % and 0.45 % NaCl with KCl 20 mEq/L 75 mL/hr at 06/20/12 2039      Assessment/Plan: 1) Villous appearing polypoid lesion in the cecum   This is most likely the source of the anemia.  The biopsies are pending at this time, but he will require surgical resection.  Plan: 1) Surgical consultation.  LOS: 4 days   Linzee Depaul D 06/22/2012, 1:32 PM

## 2012-06-22 NOTE — Evaluation (Signed)
Occupational Therapy Evaluation Patient Details Name: [name] MRN: [id number] DOB: 1934/03/25 Today's Date: 06/22/2012 Time: 3244-0102 OT Time Calculation (min): 21 min  OT Assessment / Plan / Recommendation Clinical Impression  This 78 y.o. male admitted with GIB and Hbg < 4.   Pt demonstrates generalized weakness.  He will benefit from acute OT to maximize safety and independence with BADLs to allow pt. to return to modified independent level after post acute rehab - will likely require SNF    OT Assessment  Patient needs continued OT Services    Follow Up Recommendations  Skilled nursing facility    Barriers to Discharge Decreased caregiver support    Equipment Recommendations  None recommended by OT    Recommendations for Other Services    Frequency  Min 2X/week    Precautions / Restrictions Precautions Precautions: Fall Restrictions Weight Bearing Restrictions: No       ADL  Eating/Feeding: Set up Where Assessed - Eating/Feeding: Bed level;Chair Grooming: Therapist, nutritional;Set up Where Assessed - Grooming: Supine, head of bed up Upper Body Bathing: Moderate assistance Where Assessed - Upper Body Bathing: Supported sitting Lower Body Bathing: Maximal assistance Where Assessed - Lower Body Bathing: Supported sit to stand Upper Body Dressing: Maximal assistance Where Assessed - Upper Body Dressing: Unsupported sitting Lower Body Dressing: +1 Total assistance Where Assessed - Lower Body Dressing: Supported sit to Pharmacist, hospital: +2 Total assistance Toilet Transfer: Patient Percentage: 60% Statistician Method: Surveyor, minerals: Other (comment) (transfer to recliner) Toileting - Clothing Manipulation and Hygiene: +1 Total assistance Where Assessed - Glass blower/designer Manipulation and Hygiene: Standing Equipment Used: Gait belt ADL Comments: Pt fatigues quickly    OT Diagnosis: Generalized weakness  OT Problem List: Decreased  strength;Decreased range of motion;Decreased activity tolerance;Impaired balance (sitting and/or standing);Decreased safety awareness;Decreased knowledge of use of DME or AE;Obesity;Impaired UE functional use OT Treatment Interventions: Self-care/ADL training;Therapeutic exercise;DME and/or AE instruction;Therapeutic activities;Patient/family education;Balance training   OT Goals Acute Rehab OT Goals OT Goal Formulation: With patient Time For Goal Achievement: 07/06/12 Potential to Achieve Goals: Good ADL Goals Pt Will Perform Upper Body Bathing: with set-up;Sitting, chair;Sitting, edge of bed ADL Goal: Upper Body Bathing - Progress: Goal set today Pt Will Perform Lower Body Bathing: with min assist;Sit to stand from chair;Sit to stand from bed ADL Goal: Lower Body Bathing - Progress: Goal set today Pt Will Perform Upper Body Dressing: with min assist;Sitting, chair;Sitting, bed ADL Goal: Upper Body Dressing - Progress: Goal set today Pt Will Perform Lower Body Dressing: with min assist;with adaptive equipment;Sit to stand from bed;Sit to stand from chair ADL Goal: Lower Body Dressing - Progress: Goal set today Pt Will Transfer to Toilet: with min assist;Ambulation;3-in-1 ADL Goal: Toilet Transfer - Progress: Goal set today Pt Will Perform Toileting - Clothing Manipulation: with min assist;Standing ADL Goal: Toileting - Clothing Manipulation - Progress: Goal set today Additional ADL Goal #1: Pt. will demonstrate bil. UE strength 3+/5 as meeded fpr ADLs ADL Goal: Additional Goal #1 - Progress: Goal set today  Visit Information  Last OT Received On: 06/22/12 Assistance Needed: +2    Subjective Data  Subjective: "I'm really weak Patient Stated Goal: To get stronger   Prior Functioning     Home Living Lives With: Alone Type of Home: Apartment Home Access: Level entry Home Layout: One level Bathroom Shower/Tub: Engineer, manufacturing systems: Standard Bathroom Accessibility:  No Home Adaptive Equipment: Straight cane;Grab bars in shower Prior Function Level of Independence: Independent  with assistive device(s) (with difficulty) Able to Take Stairs?: No Driving: Yes Vocation: Retired Comments: Pt reports he stopped cooking due to fatigue, and used a SPC in the community Communication Communication: No difficulties Dominant Hand: Right         Vision/Perception     Cognition  Overall Cognitive Status: Appears within functional limits for tasks assessed/performed Arousal/Alertness: Awake/alert Orientation Level: Appears intact for tasks assessed Behavior During Session: [facility] for tasks performed Cognition - Other Comments: Pt is a bit slow to process info, anticipate this is his baseline    Extremity/Trunk Assessment Right Upper Extremity Assessment RUE ROM/Strength/Tone: Deficits RUE ROM/Strength/Tone Deficits: shoulder 2/5; elbow 3/5 RUE Coordination: Deficits RUE Coordination Deficits: due to weakness Left Upper Extremity Assessment LUE ROM/Strength/Tone: Deficits LUE ROM/Strength/Tone Deficits: shoulder 2/5; elbow 3/5 LUE Coordination: Deficits LUE Coordination Deficits: due to weakness Right Lower Extremity Assessment RLE ROM/Strength/Tone: Deficits RLE ROM/Strength/Tone Deficits: AROM WFL, Strength grossly 4-/5 RLE Sensation: WFL - Light Touch Left Lower Extremity Assessment LLE ROM/Strength/Tone: Deficits LLE ROM/Strength/Tone Deficits: AROM WFL, Strength grossly 4-/5 LLE Sensation: WFL - Light Touch Trunk Assessment Trunk Assessment: Normal     Mobility Bed Mobility Bed Mobility: Supine to Sit;Sitting - Scoot to Edge of Bed Supine to Sit: 3: Mod assist Sitting - Scoot to Edge of Bed: 3: Mod assist Details for Bed Mobility Assistance: cues for sequencing, safe technique, encouragement.   Transfers Transfers: Sit to Stand;Stand to Sit Sit to Stand: 1: +2 Total assist;With upper extremity assist;From bed Sit to Stand: Patient  Percentage: 60% Stand to Sit: 1: +2 Total assist;With upper extremity assist;To chair/3-in-1 Stand to Sit: Patient Percentage: 60% Details for Transfer Assistance: cues for sequencing, safe technique, anterior wt shift with sit to stand, UE use.       Shoulder Instructions     Exercise     Balance Balance Balance Assessed: Yes Static Sitting Balance Static Sitting - Balance Support: Bilateral upper extremity supported;Feet supported Static Sitting - Level of Assistance: 5: Stand by assistance Static Standing Balance Static Standing - Balance Support: Bilateral upper extremity supported Static Standing - Level of Assistance: 1: +2 Total assist Static Standing - Comment/# of Minutes: cues for upright posture, pt tends to have posterior lean.     End of Session OT - End of Session Equipment Utilized During Treatment: Gait belt Activity Tolerance: Patient limited by fatigue Patient left: in bed;with call bell/phone within reach  GO     Robyne Matar M 06/22/2012, 2:12 PM

## 2012-06-22 NOTE — Progress Notes (Signed)
Clinical Social Work Department BRIEF PSYCHOSOCIAL ASSESSMENT 06/22/2012  Patient:  [name], [name]     Account Number:  [id number]     Admit date:  06/18/2012  Clinical Social Worker:  Dennison Bulla  Date/Time:  06/22/2012 11:00 AM  Referred by:  Care Management  Date Referred:  06/22/2012 Referred for  SNF Placement   Other Referral:   Interview type:  Patient Other interview type:    PSYCHOSOCIAL DATA Living Status:  ALONE Admitted from facility:   Level of care:   Primary support name:  Tammy Primary support relationship to patient:  CHILD, ADULT Degree of support available:   Lacking per patient    CURRENT CONCERNS Current Concerns  Post-Acute Placement   Other Concerns:    SOCIAL WORK ASSESSMENT / PLAN CSW received referral from CM who reported that patient was interested in placement at dc. CSW reviewed chart and met with patient at bedside. No visitors present.    CSW introduced myself and explained role. Patient lives alone but his dtr lives 2 doors down in the same apartment building. Patient reports that his dtr does not want to live with him and reports frustration that he was laying on the floor for 2 days before his dtr came to check on him. Patient reports that his dtr does not assist him with ADLs and will not assist with grocery shopping. Patient specifically requested assistance with food at dc. CSW questioned if patient felt safe returning home if he were able to get assistance through Meals on Wheels. After discussion, patient reports he does need ST SNF and possibly ALF after SNF stay. CSW provided patient with SNF list and explained process. CSW explained Medicare benefits for SNF and received permission to fax out to [facility]. Patient reports that CSW can call dtr and update her on plans.    CSW called dtr but number listed in chart is wrong number. Patient states that dtr is carrying his cell phone. CSW called this number but no one  answered and voicemail is full so CSW unable to leave a message. CSW will continue to attempt to reach dtr.    CSW submitted for [facility], completed FL2 and faxed out. CSW will follow up with bed offers.   Assessment/plan status:  Psychosocial Support/Ongoing Assessment of Needs Other assessment/ plan:   Information/referral to community resources:   SNF information    PATIENT'S/FAMILY'S RESPONSE TO PLAN OF CARE: Patient alert and oriented. Patient receptive to CSW consult and agreeable to dtr being informed of plans. Patient agreeable for CSW to continue following him during his hospital stay.        Coverage for [facility]

## 2012-06-23 ENCOUNTER — Encounter (HOSPITAL_COMMUNITY): Payer: Self-pay | Admitting: Internal Medicine

## 2012-06-23 ENCOUNTER — Inpatient Hospital Stay (HOSPITAL_COMMUNITY): Payer: Medicare Other

## 2012-06-23 DIAGNOSIS — D62 Acute posthemorrhagic anemia: Secondary | ICD-10-CM

## 2012-06-23 DIAGNOSIS — Z01810 Encounter for preprocedural cardiovascular examination: Secondary | ICD-10-CM

## 2012-06-23 DIAGNOSIS — K922 Gastrointestinal hemorrhage, unspecified: Secondary | ICD-10-CM

## 2012-06-23 DIAGNOSIS — D490 Neoplasm of unspecified behavior of digestive system: Secondary | ICD-10-CM

## 2012-06-23 DIAGNOSIS — I428 Other cardiomyopathies: Secondary | ICD-10-CM

## 2012-06-23 LAB — CBC
HCT: 25.9 % — ABNORMAL LOW (ref 39.0–52.0)
Hemoglobin: 8 g/dL — ABNORMAL LOW (ref 13.0–17.0)
MCHC: 30.9 g/dL (ref 30.0–36.0)
RBC: 3.33 MIL/uL — ABNORMAL LOW (ref 4.22–5.81)
WBC: 13.4 10*3/uL — ABNORMAL HIGH (ref 4.0–10.5)

## 2012-06-23 LAB — GLUCOSE, CAPILLARY
Glucose-Capillary: 196 mg/dL — ABNORMAL HIGH (ref 70–99)
Glucose-Capillary: 175 mg/dL — ABNORMAL HIGH (ref 70–99)
Glucose-Capillary: 156 mg/dL — ABNORMAL HIGH (ref 70–99)

## 2012-06-23 LAB — COMPREHENSIVE METABOLIC PANEL
CO2: 24 mEq/L (ref 19–32)
Calcium: 8.7 mg/dL (ref 8.4–10.5)
Creatinine, Ser: 1.3 mg/dL (ref 0.50–1.35)
GFR calc Af Amer: 60 mL/min — ABNORMAL LOW (ref >90)
GFR calc non Af Amer: 52 mL/min — ABNORMAL LOW (ref >90)
Glucose, Bld: 174 mg/dL — ABNORMAL HIGH (ref 70–99)
Sodium: 140 mEq/L (ref 135–145)
Total Protein: 6.3 g/dL (ref 6.0–8.3)

## 2012-06-23 MED ORDER — ATORVASTATIN CALCIUM 20 MG PO TABS
20.0000 mg | ORAL_TABLET | Freq: Every day | ORAL | Status: DC
  Administered 2012-06-23 – 2012-06-25 (×3): 20 mg via ORAL
  Filled 2012-06-23 (×4): qty 1

## 2012-06-23 MED ORDER — SIMETHICONE 80 MG PO CHEW
80.0000 mg | CHEWABLE_TABLET | Freq: Four times a day (QID) | ORAL | Status: DC | PRN
  Filled 2012-06-23: qty 1

## 2012-06-23 MED ORDER — TERAZOSIN HCL 5 MG PO CAPS
5.0000 mg | ORAL_CAPSULE | Freq: Every day | ORAL | Status: DC
  Administered 2012-06-23 – 2012-06-25 (×3): 5 mg via ORAL
  Filled 2012-06-23 (×5): qty 1

## 2012-06-23 MED ORDER — INSULIN ASPART 100 UNIT/ML ~~LOC~~ SOLN
0.0000 [IU] | Freq: Three times a day (TID) | SUBCUTANEOUS | Status: DC
  Administered 2012-06-23 – 2012-06-25 (×7): 3 [IU] via SUBCUTANEOUS
  Administered 2012-06-25 (×2): 2 [IU] via SUBCUTANEOUS
  Administered 2012-06-25: 3 [IU] via SUBCUTANEOUS
  Administered 2012-06-25 – 2012-06-26 (×2): 2 [IU] via SUBCUTANEOUS
  Administered 2012-06-26: 5 [IU] via SUBCUTANEOUS

## 2012-06-23 MED ORDER — LABETALOL HCL 200 MG PO TABS
200.0000 mg | ORAL_TABLET | Freq: Two times a day (BID) | ORAL | Status: DC
  Administered 2012-06-23 – 2012-06-26 (×7): 200 mg via ORAL
  Filled 2012-06-23 (×9): qty 1

## 2012-06-23 MED ORDER — ONDANSETRON HCL 4 MG/2ML IJ SOLN
4.0000 mg | Freq: Four times a day (QID) | INTRAMUSCULAR | Status: DC
  Administered 2012-06-23 – 2012-06-26 (×4): 4 mg via INTRAVENOUS
  Filled 2012-06-23 (×5): qty 2

## 2012-06-23 MED ORDER — LISINOPRIL 5 MG PO TABS
5.0000 mg | ORAL_TABLET | Freq: Every day | ORAL | Status: DC
  Administered 2012-06-23 – 2012-06-26 (×4): 5 mg via ORAL
  Filled 2012-06-23 (×4): qty 1

## 2012-06-23 MED ORDER — FUROSEMIDE 40 MG PO TABS
40.0000 mg | ORAL_TABLET | Freq: Every day | ORAL | Status: DC
  Administered 2012-06-23 – 2012-06-26 (×4): 40 mg via ORAL
  Filled 2012-06-23 (×4): qty 1

## 2012-06-23 NOTE — Consult Note (Signed)
CARDIOLOGY CONSULT NOTE  Patient ID: [name], MRN: [id number], DOB/AGE: [birth year] 78 y.o. Admit date: 06/18/2012   Date of Consult: 06/23/2012 Primary Physician: Carollee Herter, MD Primary Cardiologist: new  Chief Complaint: fall Reason for Consult: pre-op clearance in a patient with HTN, DM, HL admitted with fall, Hgb 3.6 and colon mass with troponin 1.20 and EF 40-45% this admission  HPI: [name] is a 78 y/o M with hx of HTN, HL, DM, CKD stage I-II, poor social situation by prior progress notes presented to [facility] 06/18/12 with fall and inability to get up for 2 days. He fell after using the bathroom and could not get back up, denies LOC or hitting his head. As part of his workup was found to be profoundly anemic with Hgb 3.6 and platelets 35k. Reported tarry stools and vomiting blood. Acidotic with LA 10.5; metformin was d/c'd. This admission, also had ARF with Cr 2.11, elevated AST/ALT up to 1400-1500 range. PT/INR were baseline elevated. He received multiple units of PRBCs and FFP. Troponin peak 1.20. EGD: LA grade A esophagitis, mild gastritis. However, colonoscopy 10/27: large multilobulated mass ?villous adenoma in cecum and multiple polyps removed. Path pending. Surgery has been consulted for resection and we are asked to see for pre-op clearance.  He is a poor historian. He knows he came here because of a fall but has a difficult time recounting what precipitated the fall. He felt very weak. He denies any CP or SOB. No fevers, chills, nausea or vomiting. No abd pain at present. From cardiac standpoint, trop up to 1.20 this admission. Echo: EF 40-45% with severe HK mid-distal anteroseptal myocardium.   Past Medical History  Diagnosis Date  . Smoker   . Narcolepsy   . Hypertension   . BPH (benign prostatic hyperplasia)   . Arthritis   . Diabetes mellitus   . Neuropathy in diabetes   . Herpes   . Dyslipidemia   . Obesity   . Lymphedema     LE  lymphedemia with hx of cellulitis.  . Chronic renal disease     Previously documented as likely I-II      Most Recent Cardiac Studies: 2D Echo Study Conclusions 06/19/12 - Left ventricle: The cavity size was mildly dilated. Wall thickness was normal. Systolic function was mildly to moderately reduced. The estimated ejection fraction was in the range of 40% to 45%. Severe hypokinesis of the mid-distalanteroseptal myocardium. There was fusion of early and atrial contributions to ventricular filling. - Left atrium: The atrium was moderately dilated. - Pulmonary arteries: PA peak pressure: 37mm Hg (S).   Surgical History:  Past Surgical History  Procedure Date  . Leg surgery     Left leg surgery. broken leg  . Esophagogastroduodenoscopy 06/19/2012    Procedure: ESOPHAGOGASTRODUODENOSCOPY (EGD);  Surgeon: Theda Belfast, MD;  Location: [facility], [place] ENDOSCOPY;  Service: Endoscopy;  Laterality: N/A;  . Colonoscopy 06/21/2012    Procedure: COLONOSCOPY;  Surgeon: Charna Elizabeth, MD;  Location: [facility] ENDOSCOPY;  Service: Endoscopy;  Laterality: N/A;     Home Meds: Prior to Admission medications   Medication Sig Start Date End Date Taking? Authorizing Provider  acetaminophen (TYLENOL) 325 MG tablet Take 650 mg by mouth every 6 (six) hours as needed. For pain   Yes Historical Provider, MD  aspirin 81 MG tablet Take 81 mg by mouth daily.   Yes Historical Provider, MD  bisacodyl (DULCOLAX) 5 MG EC tablet Take 5 mg by mouth daily as needed. For  constipation   Yes Historical Provider, MD  famotidine (PEPCID) 20 MG tablet Take 20 mg by mouth 2 (two) times daily. 01/14/12  Yes Ronnald Nian, MD  furosemide (LASIX) 40 MG tablet Take 40 mg by mouth daily. 03/30/12 03/30/13 Yes Ronnald Nian, MD  glipiZIDE (GLUCOTROL) 10 MG tablet Take 10 mg by mouth 2 (two) times daily. 03/30/12 03/30/13 Yes Ronnald Nian, MD  lisinopril-hydrochlorothiazide (PRINZIDE,ZESTORETIC) 20-12.5 MG per tablet Take 1 tablet by mouth daily. 03/30/12 03/30/13  Yes Ronnald Nian, MD  metFORMIN (GLUCOPHAGE) 1000 MG tablet Take 1,000 mg by mouth 2 (two) times daily with a meal.   Yes Historical Provider, MD  PARoxetine (PAXIL) 20 MG tablet Take 20 mg by mouth every morning. 04/10/12 04/10/13 Yes Kermit Balo Tysinger, PA  pravastatin (PRAVACHOL) 40 MG tablet Take 40 mg by mouth every evening. 03/30/12 03/30/13 Yes Ronnald Nian, MD  sennosides-docusate sodium (SENOKOT-S) 8.6-50 MG tablet Take 2 tablets by mouth daily.     Yes Historical Provider, MD  terazosin (HYTRIN) 5 MG capsule Take 5 mg by mouth daily. 03/30/12 03/30/13 Yes Ronnald Nian, MD    Inpatient Medications:    . atorvastatin  20 mg Oral q1800  . furosemide  40 mg Oral Daily  . insulin aspart  0-15 Units Subcutaneous TID WC & HS  . labetalol  200 mg Oral BID  . lisinopril  5 mg Oral Daily  . ondansetron (ZOFRAN) IV  4 mg Intravenous Q6H  . pantoprazole (PROTONIX) IV  40 mg Intravenous Q12H  . terazosin  5 mg Oral QHS  . DISCONTD: insulin aspart  0-15 Units Subcutaneous Q4H  . DISCONTD: metoprolol tartrate  25 mg Oral BID  . DISCONTD: terazosin  5 mg Oral Daily    Allergies: No Known Allergies  History   Social History  . Marital Status: Widowed    Spouse Name: N/A    Number of Children: N/A  . Years of Education: N/A   Occupational History  . Not on file.   Social History Main Topics  . Smoking status: Former Smoker    Quit date: 08/26/1998  . Smokeless tobacco: Never Used  . Alcohol Use: No  . Drug Use: No  . Sexually Active: Not on file   Other Topics Concern  . Not on file   Social History Narrative  . No narrative on file     Fam Hx: pt denies knowledge of CAD   Review of Systems: limited given that patient is difficult historian General: negative for chills, fever Cardiovascular: see above. Denies palps, syncope, orthopnea, PND. Chronic LEE Dermatological: negative for rash Respiratory: negative for cough Urologic: negative for hematuria Abdominal: negative  for nausea, vomiting, diarrhea. +dark stools Neurologic: negative for visual changes, syncope, or dizziness. +weakness prior to admission All other systems reviewed and are otherwise negative except as noted above.  Labs:  Results for KEAGHAN, KOVALENKO (MRN [id number]) as of 06/23/2012 15:04  Ref. Range 06/18/2012 14:12 06/18/2012 21:35 06/19/2012 09:33 06/19/2012 09:40 06/20/2012 00:36  CK, MB Latest Range: 0.3-4.0 ng/mL 6.3 (HH)      CK Total Latest Range: 7-232 U/L 181  184    LDH Latest Range: 94-250 U/L  1406 (H)   714 (H)  Troponin I Latest Range: <0.30 ng/mL 0.64 (HH) 0.58 (HH) 1.20 (HH) 1.07 (HH)      Lab Results  Component Value Date   WBC 13.4* 06/23/2012   HGB 8.0* 06/23/2012   HCT 25.9* 06/23/2012  MCV 77.8* 06/23/2012   PLT 218 06/23/2012    Lab 06/23/12 0630  NA 140  K 3.9  CL 104  CO2 24  BUN 13  CREATININE 1.30  CALCIUM 8.7  PROT 6.3  BILITOT 1.4*  ALKPHOS 70  ALT 344*  AST 28  GLUCOSE 174*   Lab Results  Component Value Date   CHOL 134 10/01/2011   HDL 30* 10/01/2011   LDLCALC 78 10/01/2011   TRIG 129 10/01/2011    Radiology/Studies:  Dg Chest 1 View 06/18/2012  *RADIOLOGY REPORT*  Clinical Data: Larey Seat in bathroom, shortness of breath when recumbent for pelvic radiograph, history hypertension, smoking, diabetes  CHEST - 1 VIEW  Comparison: 08/01/2009  Findings: Enlargement of cardiac silhouette with pulmonary vascular congestion. Decreased lung volumes with mild bibasilar atelectasis. Atherosclerotic calcification aorta. No definite infiltrate, pleural effusion, or pneumothorax. No fractures identified.  IMPRESSION: Enlargement of cardiac silhouette. Bibasilar atelectasis.   Original Report Authenticated By: Lollie Marrow, M.D.    Dg Pelvis 1-2 Views 06/18/2012  *RADIOLOGY REPORT*  Clinical Data: Larey Seat in bathroom, generalized pain and stiffness in both hips  PELVIS - 1-2 VIEW  Comparison: None  Findings: Bones appear mildly demineralized. Mild bilateral  narrowing of the hip joints. Symmetric SI joints. No acute fracture, dislocation, or bone destruction. Small nonspecific focus of sclerosis at the proximal right femoral diaphysis. Scattered atherosclerotic calcifications.  IMPRESSION: No definite acute bony abnormalities.   Original Report Authenticated By: Lollie Marrow, M.D.    Ct Head Wo Contrast 06/18/2012  *RADIOLOGY REPORT*  Clinical Data:  78 year old male status post fall.  CT HEAD WITHOUT CONTRAST CT CERVICAL SPINE WITHOUT CONTRAST  Technique:  Multidetector CT imaging of the head and cervical spine was performed following the standard protocol without intravenous contrast.  Multiplanar CT image reconstructions of the cervical spine were also generated.  Comparison:   None  CT HEAD  Findings: No definite acute scalp soft tissue injury.  No acute orbit soft tissue findings.  Mild paranasal sinus mucosal thickening.  Mastoids are clear.  Calvarium intact.  Scattered dural calcifications. Calcified atherosclerosis at the skull base.  Cerebral volume is within normal limits for age.  No midline shift, ventriculomegaly, mass effect, evidence of mass lesion, intracranial hemorrhage or evidence of cortically based acute infarction.  Gray-white matter differentiation is within normal limits throughout the brain.  IMPRESSION: 1.  Negative for age noncontrast CT appearance of the brain. 2.  See cervical spine findings below.  CT CERVICAL SPINE  Findings: Straightening of cervical lordosis. Visualized skull base is intact.  No atlanto-occipital dissociation.  Cervicothoracic junction alignment is within normal limits.  Lower cervical disc space narrowing.  Occasional small lucent lesions in the cervical spine, but no year most appear related to nutrient foramen.  No destructive or suspicious osseous lesion identified.  No acute cervical fracture identified.  There is a trace retropharyngeal effusion.  Partially retropharyngeal course of the left carotid artery.   Carotid calcified atherosclerosis.  Other paraspinal soft tissues are within normal limits.  IMPRESSION: 1. No acute fracture or listhesis identified in the cervical spine. Ligamentous injury is not excluded. 2.  Trace retropharyngeal effusion.  This is nonspecific but sometimes is a sign of cervical anterior ligamentous injury. Cervical MRI would confirm if the patient's cervical spine cannot be cleared clinically.   Original Report Authenticated By: Harley Hallmark, M.D.    Ct Cervical Spine Wo Contrast 06/18/2012  *RADIOLOGY REPORT*  Clinical Data:  78 year old male status post  fall.  CT HEAD WITHOUT CONTRAST CT CERVICAL SPINE WITHOUT CONTRAST  Technique:  Multidetector CT imaging of the head and cervical spine was performed following the standard protocol without intravenous contrast.  Multiplanar CT image reconstructions of the cervical spine were also generated.  Comparison:   None  CT HEAD  Findings: No definite acute scalp soft tissue injury.  No acute orbit soft tissue findings.  Mild paranasal sinus mucosal thickening.  Mastoids are clear.  Calvarium intact.  Scattered dural calcifications. Calcified atherosclerosis at the skull base.  Cerebral volume is within normal limits for age.  No midline shift, ventriculomegaly, mass effect, evidence of mass lesion, intracranial hemorrhage or evidence of cortically based acute infarction.  Gray-white matter differentiation is within normal limits throughout the brain.  IMPRESSION: 1.  Negative for age noncontrast CT appearance of the brain. 2.  See cervical spine findings below.  CT CERVICAL SPINE  Findings: Straightening of cervical lordosis. Visualized skull base is intact.  No atlanto-occipital dissociation.  Cervicothoracic junction alignment is within normal limits.  Lower cervical disc space narrowing.  Occasional small lucent lesions in the cervical spine, but no year most appear related to nutrient foramen.  No destructive or suspicious osseous lesion  identified.  No acute cervical fracture identified.  There is a trace retropharyngeal effusion.  Partially retropharyngeal course of the left carotid artery.  Carotid calcified atherosclerosis.  Other paraspinal soft tissues are within normal limits.  IMPRESSION: 1. No acute fracture or listhesis identified in the cervical spine. Ligamentous injury is not excluded. 2.  Trace retropharyngeal effusion.  This is nonspecific but sometimes is a sign of cervical anterior ligamentous injury. Cervical MRI would confirm if the patient's cervical spine cannot be cleared clinically.   Original Report Authenticated By: Harley Hallmark, M.D.    Dg Chest Port 1 View 06/20/2012  *RADIOLOGY REPORT*  Clinical Data: Monitor for pulmonary edema.  PORTABLE CHEST - 1 VIEW  Comparison: 06/19/2012  Findings: Shallow inspiration.  Cardiac enlargement with mild pulmonary vascular congestion.  No significant infiltration to suggest edema.  Obscuration of the left hemidiaphragm likely due to pleural effusion with basilar infiltration or atelectasis.  No pneumothorax.  Stable appearance since previous study.  IMPRESSION: Cardiac enlargement with mild pulmonary vascular congestion.  No edema.  Probable small left pleural effusion with basilar atelectasis or infiltration.   Original Report Authenticated By: Marlon Pel, M.D.    Dg Chest Port 1 View 06/19/2012  *RADIOLOGY REPORT*  Clinical Data: Shortness of breath.  Question pulmonary edema. Status post blood transfusion.  PORTABLE CHEST - 1 VIEW  Comparison: Portable chest 06/18/2012.  Findings: The patient has a new NG tube in place.  The tube courses into the stomach and below the inferior margin of film.  There is cardiomegaly but no pulmonary edema.  Left basilar atelectasis persist. No pneumothorax is identified.  IMPRESSION: Cardiomegaly without edema.  Left basilar atelectasis.   Original Report Authenticated By: Bernadene Bell. Maricela Curet, M.D.    Dg Abd Portable 1v 06/23/2012   *RADIOLOGY REPORT*  Clinical Data: Nausea and vomiting.  Abdominal distention.  PORTABLE ABDOMEN - 1 VIEW  Comparison: None.  Findings: Mild gaseous distention is noted in the colon.  There is no evidence for obstruction or free air.  Degenerative changes are noted in the lower lumbar spine.  IMPRESSION:  1.  Mild gaseous distention of the colon without evidence for obstruction or free air. 2.  Degenerative changes in the lower lumbar spine.   Original Report  Authenticated By: Jamesetta Orleans. MATTERN, M.D.     EKG:  - 10/24 Admit EKG is poor technical quality & reads out as "afib" 84bpm but this appears sinus rhythm with nonspecific ST-T changes - Subsequent tracing 06/19/12 shows NSR with 1st degree AVB, inferior infarct age undetermined and anteroseptal infarct age undetermined with lateral ST changes EKG is not markedly different from 04/2011  Physical Exam: Blood pressure 152/78, pulse 83, temperature 97.5 F (36.4 C), temperature source Oral, resp. rate 18, height 5\' 11"  (1.803 m), weight 262 lb 12.6 oz (119.2 kg), SpO2 96.00%. General: Well developed elderly M in no acute distress. Head: Normocephalic, atraumatic, no xanthomas, nares are without discharge, sclera mildly icteric Neck: Negative for carotid bruits. JVD not elevated. Lungs: Clear bilaterally to auscultation without wheezes, rales, or rhonchi. Breathing is unlabored. Heart: RRR with S1 S2. No murmurs, rubs, or gallops appreciated. Abdomen: Soft, non-tender, rounded with normoactive bowel sounds. No hepatomegaly. No rebound/guarding.  Msk:  Strength and tone appear normal for age. Extremities: No clubbing or cyanosis. 2+ edema up to shin with stasis changes.  Distal pedal pulses are somewhat diminished bilaterally. Neuro: Alert and oriented to self, place but had to look at calendar for date - even with this he does not know the year. No facial asymmetry but occ L sided face twitching. Moves all extremities spontaneously. Follows  commands but appears somewhat spacy and responds to questions slowly. Psych:  Flat affect.   Assessment and Plan:  78 y/o M with hx HTN, HL, DM, chronic lymphedema who presented to [facility] with a fall and weakness, found to have Hgb 3.6, AST/ALT rising to 1400-1500, acute on chronic renal insufficiency. Colon mass found on colonoscopy, planned surgical resection with path pending. We are asked to see for pre-op clearance - pt had troponin up to 1.20, EF 40-45% with WMA. Also ? of afib this admission but rhythm appears NSR with PACs, occasional nonconducted PACs.  1. Profound anemia with colon mass, path pending 2. NSTEMI likely demand ischemia 3. Newly recognized cardiomyopathy with EF 40-45% 4. HTN - improving after today's meds 5. Diabetes mellitus 6. Acute on chronic renal failure 7. Marked transaminitis with elevated baseline PT/INR, likely related to shock from anemia 8. ?Subtle MS change but improved - unclear baseline mental status - d/w primary team already, possible head CT today if symptoms fluctuate  [name] NSTEMI was likely due to demand ischemia in the setting of profound anemia but cannot exclude underlying CAD given WMA on echo and cardiac risk factors. However, even if noninvasive cardiac workup was pursued and abnormal, he would be a poor candidate for invasive intervention with his renal failure and anemia. Would recommend to treat medically as you are doing for now including BB and ACEI. No CP or SOB at present. Follow I&Os and daily weights. Avoid ASA. See below for additional thoughts.  Signed, Ronie Spies PA-C 06/23/2012, 2:58 PM  I have personally seen and examined this patient with Ronie Spies, PA-C. I agree with the assessment and plan as outlined above. He has risk factors for CAD and has wall motion abnormalities on echo, however, he had a recent life threatening GI bleed. He is at high risk for any surgical procedure but he is not a candidate at this time for a  cardiac cath. We could not consider any coronary intervention since we could not use anti-coagulation or anti-platelet therapy. Would continue beta blocker therapy and proceed with his surgery as planned.   MCALHANY,CHRISTOPHER 4:11  PM 06/23/2012

## 2012-06-23 NOTE — Progress Notes (Signed)
[facility] PROGRESS NOTE  [name] ZOX:[id number] DOB: 02-05-1934 DOA: 06/18/2012 PCP: Carollee Herter, MD  Assessment/Plan:  GI bleed.  No obvious source of bleeding on EGD 10/25.  Patient found to have a large cecal polyp on colonoscopy on 10/27 which was biopsied by Dr. Loreta Ave.  Pathology reveals tubular adenoma without high grade dysplasia, however, polyp was not removed during procedure.  Patient passing gas with mild tenderness in suprapubic region.  No ileus on KUB. -  Continue protonix bid  -  Trend hemoglobin -  Continue PRN zofran, reglan for nausea  -  Surgery consulted per Dr. Kenna Gilbert request for removal of polyp.   -  Monitor for ileus   CHF, likely ICM, EF 40% to 45% with severe hypokinesis of the mid-distalanteroseptal myocardium.  Also had positive troponins, likely demand ischemia, peak of 1.2.  Hypervolemic on exam -  Cardiology consultation -  Restart lasix and low dose lisinopril as creatinine as stabilized -  On beta blocker and restarting statin -  Hold ASA due to recent GI bleed  PAF on admission, currently in atrial fibrillation on telemetry,  CHADS 2 of 4.  Currently not anticoagulated 2/2 recent GIB. -  BP elevated so changed metoprolol to labetalol, but monitor for tachycardia on monitor -  Continue telemetry  Hx of HTN, hyperlipidemia.  Blood pressure very elevated -  Restart home dose lasix and low-dose lisinopril -  Continue lopressor  -  Hold HCTZ    Leukocytosis - may be stress reaction from recent biopsy.  Urinalysis from yesterday benign.  No cough.  KUB without evidence of free air.  If develops cough, consider CXR -  Trend WBC -   Culture and CXR if febrile  Elevated LFT's. Likely related to shock. Improving.  -  Trend LFT's   Acute blood loss anemia: s/p 4 u pRBC. CBC stable  -  Trend hgb -  Transfuse for Hb < 7 or active bleeding   Acute renal failure.  Creatinine stable at 1.3.   -  Trend creatinine  Hx of BPH.  -   Restart terazosin  Type 2 DM.  Fingersticks at goal between 140 and 180 -  SSI  -  Avoid metformin use with renal dysfx   Hx of depression.  Paxil discontinued on home MAR. Monitor symptoms   DIET:  Healthy heart ACCESS:  PIV IVF:  None PROPH:  SCDs   Code Status: Full code Family Communication:  Attempting to call family, but phone number incorrect on chart.  Spoke with patient at bedside Disposition Plan: Pending surgical resection of cecal mass   Consultants:  GI  Critical care  Cardiology  Procedures:  EGD 10/25  Colonoscopy 10/27  Antibiotics:  None  HPI/Subjective:  Patient states that he has headache and abdominal pain since his colonoscopy.  He has had nausea since yesterday and vomited some of his food.  He denies BM, but has been having flatulence.    Objective: Filed Vitals:   06/22/12 1800 06/22/12 2100 06/22/12 2231 06/23/12 0500  BP: 196/96 204/110 204/108 185/73  Pulse: 83 83 84 83  Temp: 100 F (37.8 C) 97.8 F (36.6 C)  97.5 F (36.4 C)  TempSrc: Oral     Resp: 20 16  18   Height: 5\' 11"  (1.803 m)     Weight:      SpO2: 93% 96%  96%    Intake/Output Summary (Last 24 hours) at 06/23/12 0915 Last data filed at 06/23/12 0600  Gross per 24 hour  Intake    200 ml  Output    500 ml  Net   -300 ml   Filed Weights   06/20/12 0400 06/21/12 0451 06/22/12 0500  Weight: 112 kg (246 lb 14.6 oz) 117.7 kg (259 lb 7.7 oz) 119.2 kg (262 lb 12.6 oz)    Exam:   General:   [name], no acute distress, sitting in bed  HEENT:  MMM, facial twitch on left side is chronic per patient  Cardiovascular:  Irregularly irregular, tachycardic, no S1, S2, 2+ pulses  Respiratory: CTAB anteriorly  Abdomen:  Mildly distended, hypoactive but present BS, soft, tender to palpation over suprapubic region without rebound or guarding  MSK:  3+ LEE with hyperpigmentation and hair loss on legs  Data Reviewed: Basic Metabolic Panel:  Lab 06/23/12 1610 06/22/12  1000 06/21/12 0440 06/20/12 0800 06/20/12 0036 06/19/12 0933  NA 140 139 138 143 140 --  K 3.9 4.0 4.0 3.9 3.7 --  CL 104 106 103 107 102 --  CO2 24 26 26 28 27  --  GLUCOSE 174* 103* 145* 95 123* --  BUN 13 15 26* 36* 39* --  CREATININE 1.30 1.34 1.54* 1.75* 1.86* --  CALCIUM 8.7 8.5 8.6 8.6 9.0 --  MG -- -- -- -- -- 2.3  PHOS -- -- -- -- -- 3.8   Liver Function Tests:  Lab 06/23/12 0630 06/22/12 1000 06/21/12 0440 06/20/12 0800 06/20/12 0036  AST 28 57* 192* 448* 739*  ALT 344* 486* 799* 1030* 1190*  ALKPHOS 70 67 67 59 64  BILITOT 1.4* 1.2 1.0 1.0 1.2  PROT 6.3 6.1 6.2 6.1 6.6  ALBUMIN 3.1* 3.1* 3.3* 3.3* 3.5   No results found for this basename: LIPASE:5,AMYLASE:5 in the last 168 hours No results found for this basename: AMMONIA:5 in the last 168 hours CBC:  Lab 06/23/12 0630 06/21/12 0815 06/20/12 2001 06/20/12 0800 06/20/12 0036  WBC 13.4* 6.1 7.5 7.5 8.2  NEUTROABS -- -- -- -- --  HGB 8.0* 7.4* 7.7* 7.3* 7.4*  HCT 25.9* 24.1* 24.8* 23.2* 23.8*  MCV 77.8* 77.5* 77.5* 77.1* 76.8*  PLT 218 101* 73* 74* 67*   Cardiac Enzymes:  Lab 06/19/12 0940 06/19/12 0933 06/18/12 2135 06/18/12 1412  CKTOTAL -- 184 -- 181  CKMB -- -- -- 6.3*  CKMBINDEX -- -- -- --  TROPONINI 1.07* 1.20* 0.58* 0.64*   BNP (last 3 results) No results found for this basename: PROBNP:3 in the last 8760 hours CBG:  Lab 06/23/12 0729 06/22/12 2106 06/22/12 1804 06/22/12 1615 06/22/12 1201  GLUCAP 166* 155* 166* 192* 119*    Recent Results (from the past 240 hour(s))  MRSA PCR SCREENING     Status: Normal   Collection Time   06/18/12  7:44 PM      Component Value Range Status Comment   MRSA by PCR NEGATIVE  NEGATIVE Final   CULTURE, BLOOD (ROUTINE X 2)     Status: Normal (Preliminary result)   Collection Time   06/18/12  9:36 PM      Component Value Range Status Comment   Specimen Description BLOOD RIGHT HAND   Final    Special Requests BOTTLES DRAWN AEROBIC ONLY 8CC   Final    Culture   Setup Time 06/19/2012 01:52   Final    Culture     Final    Value:        BLOOD CULTURE RECEIVED NO GROWTH TO DATE CULTURE WILL BE HELD  FOR 5 DAYS BEFORE ISSUING A FINAL NEGATIVE REPORT   Report Status PENDING   Incomplete   CULTURE, BLOOD (ROUTINE X 2)     Status: Normal (Preliminary result)   Collection Time   06/18/12  9:36 PM      Component Value Range Status Comment   Specimen Description BLOOD LEFT HAND   Final    Special Requests BOTTLES DRAWN AEROBIC ONLY 4CC   Final    Culture  Setup Time 06/19/2012 01:52   Final    Culture     Final    Value:        BLOOD CULTURE RECEIVED NO GROWTH TO DATE CULTURE WILL BE HELD FOR 5 DAYS BEFORE ISSUING A FINAL NEGATIVE REPORT   Report Status PENDING   Incomplete      Studies: Dg Abd Portable 1v  06/23/2012  *RADIOLOGY REPORT*  Clinical Data: Nausea and vomiting.  Abdominal distention.  PORTABLE ABDOMEN - 1 VIEW  Comparison: None.  Findings: Mild gaseous distention is noted in the colon.  There is no evidence for obstruction or free air.  Degenerative changes are noted in the lower lumbar spine.  IMPRESSION:  1.  Mild gaseous distention of the colon without evidence for obstruction or free air. 2.  Degenerative changes in the lower lumbar spine.   Original Report Authenticated By: Jamesetta Orleans. MATTERN, M.D.     Scheduled Meds:   . insulin aspart  0-15 Units Subcutaneous TID WC & HS  . labetalol  200 mg Oral BID  . pantoprazole (PROTONIX) IV  40 mg Intravenous Q12H  . terazosin  5 mg Oral Daily  . DISCONTD: insulin aspart  0-15 Units Subcutaneous Q4H  . DISCONTD: metoprolol tartrate  25 mg Oral BID   Continuous Infusions:   Active Problems:  GI bleed    Time spent: 45    Wakeelah Solan, [facility]  [facility] Pager [phone]. If 8PM-8AM, please contact night-coverage at [email or web address], password [facility] 06/23/2012, 9:15 AM  LOS: 5 days

## 2012-06-23 NOTE — Consult Note (Signed)
Reason for Consult: Lesion in cecum/GI bleed with profound anemia Referring Physician: Dr. Glorianne Manchester [name] is an 78 y.o. male.  HPI: 78 yo male with h/o HTN, DM and narcolepsy who was brought to the ED by EMS after a fall and inability to get back up for 2 days. He stated that he fell after using the bathroom and could not get back up. He denied any loss of consciousness at the time. He was finally able to call EMS after laying on the floor for 24 hours. He denied hitting his head. Denies any headache or neck pain.  In the ED, he was found to have a hemoglobin of 3.6. He reports noticing tarry stools. He denied any red blood per rectum and also denied any hematemesis. Since his admission he has undergone a GI workup which showed a villous appearing polypoid lesion in the cecum that is the likely cause for his bleeding.  Biopsies were taken and we were consulted for definitive resection.  The patient relates to me he has had abdominal pain since yesterday with vomiting that did make him feel better.  He has not had a BM in the last 24 hours.  He does relate flatus.  He denies a h/o of abdominal surgeon or problems with his intestines.      Past Medical History  Diagnosis Date  . Smoker   . Narcolepsy   . Hypertension   . BPH (benign prostatic hyperplasia)   . Arthritis   . Diabetes mellitus   . Neuropathy in diabetes   . Herpes   . Dyslipidemia   . Obesity     Past Surgical History  Procedure Date  . Leg surgery     Left leg surgery. broken leg  . Esophagogastroduodenoscopy 06/19/2012    Procedure: ESOPHAGOGASTRODUODENOSCOPY (EGD);  Surgeon: Theda Belfast, MD;  Location: [facility] ENDOSCOPY;  Service: Endoscopy;  Laterality: N/A;  . Colonoscopy 06/21/2012    Procedure: COLONOSCOPY;  Surgeon: Charna Elizabeth, MD;  Location: [facility] ENDOSCOPY;  Service: Endoscopy;  Laterality: N/A;    History reviewed. No pertinent family history.  Social History:  reports that he quit smoking about 13  years ago. He has never used smokeless tobacco. He reports that he does not drink alcohol or use illicit drugs.  Allergies: No Known Allergies  Medications: I have reviewed the patient's current medications.  Results for orders placed during the hospital encounter of 06/18/12 (from the past 48 hour(s))  GLUCOSE, CAPILLARY     Status: Abnormal   Collection Time   06/21/12 11:22 AM      Component Value Range Comment   Glucose-Capillary 114 (*) 70 - 99 mg/dL   GLUCOSE, CAPILLARY     Status: Abnormal   Collection Time   06/21/12  3:24 PM      Component Value Range Comment   Glucose-Capillary 106 (*) 70 - 99 mg/dL   GLUCOSE, CAPILLARY     Status: Abnormal   Collection Time   06/21/12  8:04 PM      Component Value Range Comment   Glucose-Capillary 149 (*) 70 - 99 mg/dL   GLUCOSE, CAPILLARY     Status: Abnormal   Collection Time   06/22/12 12:07 AM      Component Value Range Comment   Glucose-Capillary 148 (*) 70 - 99 mg/dL    Comment 1 Notify RN     GLUCOSE, CAPILLARY     Status: Normal   Collection Time   06/22/12  4:02  AM      Component Value Range Comment   Glucose-Capillary 96  70 - 99 mg/dL   GLUCOSE, CAPILLARY     Status: Normal   Collection Time   06/22/12  8:17 AM      Component Value Range Comment   Glucose-Capillary 97  70 - 99 mg/dL   COMPREHENSIVE METABOLIC PANEL     Status: Abnormal   Collection Time   06/22/12 10:00 AM      Component Value Range Comment   Sodium 139  135 - 145 mEq/L    Potassium 4.0  3.5 - 5.1 mEq/L    Chloride 106  96 - 112 mEq/L    CO2 26  19 - 32 mEq/L    Glucose, Bld 103 (*) 70 - 99 mg/dL    BUN 15  6 - 23 mg/dL    Creatinine, Ser 0.45  0.50 - 1.35 mg/dL    Calcium 8.5  8.4 - 40.9 mg/dL    Total Protein 6.1  6.0 - 8.3 g/dL    Albumin 3.1 (*) 3.5 - 5.2 g/dL    AST 57 (*) 0 - 37 U/L    ALT 486 (*) 0 - 53 U/L    Alkaline Phosphatase 67  39 - 117 U/L    Total Bilirubin 1.2  0.3 - 1.2 mg/dL    GFR calc non Af Amer 50 (*) >90 mL/min     GFR calc Af Amer 58 (*) >90 mL/min   IRON AND TIBC     Status: Abnormal   Collection Time   06/22/12 10:00 AM      Component Value Range Comment   Iron 12 (*) 42 - 135 ug/dL    TIBC 811  914 - 782 ug/dL    Saturation Ratios 3 (*) 20 - 55 %    UIBC 360  125 - 400 ug/dL   FERRITIN     Status: Abnormal   Collection Time   06/22/12 10:00 AM      Component Value Range Comment   Ferritin 21 (*) 22 - 322 ng/mL   GLUCOSE, CAPILLARY     Status: Abnormal   Collection Time   06/22/12 12:01 PM      Component Value Range Comment   Glucose-Capillary 119 (*) 70 - 99 mg/dL   GLUCOSE, CAPILLARY     Status: Abnormal   Collection Time   06/22/12  4:15 PM      Component Value Range Comment   Glucose-Capillary 192 (*) 70 - 99 mg/dL   GLUCOSE, CAPILLARY     Status: Abnormal   Collection Time   06/22/12  6:04 PM      Component Value Range Comment   Glucose-Capillary 166 (*) 70 - 99 mg/dL    Comment 1 Notify RN     GLUCOSE, CAPILLARY     Status: Abnormal   Collection Time   06/22/12  9:06 PM      Component Value Range Comment   Glucose-Capillary 155 (*) 70 - 99 mg/dL   COMPREHENSIVE METABOLIC PANEL     Status: Abnormal   Collection Time   06/23/12  6:30 AM      Component Value Range Comment   Sodium 140  135 - 145 mEq/L    Potassium 3.9  3.5 - 5.1 mEq/L    Chloride 104  96 - 112 mEq/L    CO2 24  19 - 32 mEq/L    Glucose, Bld 174 (*) 70 - 99 mg/dL  BUN 13  6 - 23 mg/dL    Creatinine, Ser 1.61  0.50 - 1.35 mg/dL    Calcium 8.7  8.4 - 09.6 mg/dL    Total Protein 6.3  6.0 - 8.3 g/dL    Albumin 3.1 (*) 3.5 - 5.2 g/dL    AST 28  0 - 37 U/L    ALT 344 (*) 0 - 53 U/L    Alkaline Phosphatase 70  39 - 117 U/L    Total Bilirubin 1.4 (*) 0.3 - 1.2 mg/dL    GFR calc non Af Amer 52 (*) >90 mL/min    GFR calc Af Amer 60 (*) >90 mL/min   CBC     Status: Abnormal   Collection Time   06/23/12  6:30 AM      Component Value Range Comment   WBC 13.4 (*) 4.0 - 10.5 K/uL    RBC 3.33 (*) 4.22 - 5.81  MIL/uL    Hemoglobin 8.0 (*) 13.0 - 17.0 g/dL    HCT 04.5 (*) 40.9 - 52.0 %    MCV 77.8 (*) 78.0 - 100.0 fL    MCH 24.0 (*) 26.0 - 34.0 pg    MCHC 30.9  30.0 - 36.0 g/dL    RDW 81.1 (*) 91.4 - 15.5 %    Platelets 218  150 - 400 K/uL   GLUCOSE, CAPILLARY     Status: Abnormal   Collection Time   06/23/12  7:29 AM      Component Value Range Comment   Glucose-Capillary 166 (*) 70 - 99 mg/dL     Dg Abd Portable 1v  06/23/2012  *RADIOLOGY REPORT*  Clinical Data: Nausea and vomiting.  Abdominal distention.  PORTABLE ABDOMEN - 1 VIEW  Comparison: None.  Findings: Mild gaseous distention is noted in the colon.  There is no evidence for obstruction or free air.  Degenerative changes are noted in the lower lumbar spine.  IMPRESSION:  1.  Mild gaseous distention of the colon without evidence for obstruction or free air. 2.  Degenerative changes in the lower lumbar spine.   Original Report Authenticated By: Jamesetta Orleans. MATTERN, M.D.     Review of Systems  Constitutional: Positive for malaise/fatigue. Negative for fever, chills and weight loss.  HENT: Negative.   Eyes: Negative.   Respiratory: Negative.   Cardiovascular: Negative.   Gastrointestinal: Positive for nausea (since yesterday), vomiting (since yesterday) and abdominal pain (since yesterday).  Genitourinary: Negative.   Musculoskeletal: Negative.   Skin: Negative.   Neurological: Positive for dizziness. Negative for tingling and tremors.  Endo/Heme/Allergies: Negative.   Psychiatric/Behavioral: Negative.    Blood pressure 185/73, pulse 83, temperature 97.5 F (36.4 C), temperature source Oral, resp. rate 18, height 5\' 11"  (1.803 m), weight 262 lb 12.6 oz (119.2 kg), SpO2 96.00%. Physical Exam  Constitutional: He is oriented to person, place, and time. He appears well-developed and well-nourished. No distress.  HENT:  Head: Normocephalic and atraumatic.  Eyes: Conjunctivae normal are normal. Pupils are equal, round, and reactive  to light.  Neck: Normal range of motion. Neck supple.  Cardiovascular: Normal rate and regular rhythm.   Respiratory: Effort normal and breath sounds normal.  GI: He exhibits distension (mildly). There is tenderness. There is no rebound and no guarding.       Very faint BS, tender throughout  Musculoskeletal: He exhibits edema (2+ bilateral lower extremities).  Neurological: He is alert and oriented to person, place, and time.  Skin: Skin is warm and dry.  Some stasis changes in bilateral LE  Psychiatric: He has a normal mood and affect. His behavior is normal.    Assessment/Plan: 1. Villous appearing polypoid lesion in the cecum: likely cause of anemia with acute on chronic with presentation.  Currently no bleeding.  Biopsies pending on lesion.  Will need definitive resection of this area.  OR timing will be the issue.  The patient at current has abdominal pain with nausea and vomiting which he did not have on admission.  Likely will need surgery on this admission.  Dr. Derrell Lolling will be by to see the patient later today to discuss further.   WHITE, ELIZABETH 06/23/2012, 10:10 AM

## 2012-06-23 NOTE — Consult Note (Signed)
Pt describes having hemetemsis prior to arrival.  EGD normal.  C-scope revealed villious adenoma high-grade, but no bleeding seen.  Pt may have a chronic anemia from his cecal mass, but don't think any acute GI blood loss due to it.  Not sure if cecal mass tattooed.  If patient has no acute bleeding episodes, can likely proceed with outpatient scheduling of colon resection

## 2012-06-24 LAB — COMPREHENSIVE METABOLIC PANEL
AST: 17 U/L (ref 0–37)
Albumin: 2.7 g/dL — ABNORMAL LOW (ref 3.5–5.2)
Chloride: 101 mEq/L (ref 96–112)
Creatinine, Ser: 1.4 mg/dL — ABNORMAL HIGH (ref 0.50–1.35)
Total Bilirubin: 1.4 mg/dL — ABNORMAL HIGH (ref 0.3–1.2)
Total Protein: 5.7 g/dL — ABNORMAL LOW (ref 6.0–8.3)

## 2012-06-24 LAB — CBC
MCHC: 31.6 g/dL (ref 30.0–36.0)
MCV: 77.7 fL — ABNORMAL LOW (ref 78.0–100.0)
Platelets: 312 10*3/uL (ref 150–400)
RDW: 27.6 % — ABNORMAL HIGH (ref 11.5–15.5)
WBC: 13.6 10*3/uL — ABNORMAL HIGH (ref 4.0–10.5)

## 2012-06-24 LAB — GLUCOSE, CAPILLARY
Glucose-Capillary: 154 mg/dL — ABNORMAL HIGH (ref 70–99)
Glucose-Capillary: 193 mg/dL — ABNORMAL HIGH (ref 70–99)
Glucose-Capillary: 181 mg/dL — ABNORMAL HIGH (ref 70–99)
Glucose-Capillary: 142 mg/dL — ABNORMAL HIGH (ref 70–99)

## 2012-06-24 NOTE — Progress Notes (Signed)
    SUBJECTIVE: No SOB or chest pain. Only c/o abdominal pain.   BP 144/73  Pulse 84  Temp 99.5 F (37.5 C) (Oral)  Resp 18  Ht 5\' 11"  (1.803 m)  Wt 262 lb 12.6 oz (119.2 kg)  BMI 36.65 kg/m2  SpO2 94%  Intake/Output Summary (Last 24 hours) at 06/24/12 1152 Last data filed at 06/23/12 1200  Gross per 24 hour  Intake    120 ml  Output      0 ml  Net    120 ml    PHYSICAL EXAM General: Well developed, well nourished, in no acute distress. Slow speech pattern. Appears oriented to person and place Psych:  Flat affect, responds appropriately Neck: No JVD. No masses noted.  Lungs: Clear bilaterally with no wheezes or rhonci noted.  Heart: RRR with no murmurs noted. Abdomen: Bowel sounds are present. Soft, non-tender.  Extremities: 1+ bilateral lower extremity edema.   LABS: Basic Metabolic Panel:  Basename 06/24/12 0508 06/23/12 0630  NA 136 140  K 3.9 3.9  CL 101 104  CO2 26 24  GLUCOSE 145* 174*  BUN 13 13  CREATININE 1.40* 1.30  CALCIUM 8.4 8.7  MG -- --  PHOS -- --   CBC:  Basename 06/24/12 0508 06/23/12 0630  WBC 13.6* 13.4*  NEUTROABS -- --  HGB 7.5* 8.0*  HCT 23.7* 25.9*  MCV 77.7* 77.8*  PLT 312 218    Current Meds:    . atorvastatin  20 mg Oral q1800  . furosemide  40 mg Oral Daily  . insulin aspart  0-15 Units Subcutaneous TID WC & HS  . labetalol  200 mg Oral BID  . lisinopril  5 mg Oral Daily  . ondansetron (ZOFRAN) IV  4 mg Intravenous Q6H  . pantoprazole (PROTONIX) IV  40 mg Intravenous Q12H  . terazosin  5 mg Oral QHS  . DISCONTD: terazosin  5 mg Oral Daily     ASSESSMENT and PLAN:  78 y/o M with hx HTN, HL, DM, chronic lymphedema who presented to [facility] with a fall and weakness, found to have Hgb 3.6, AST/ALT rising to 1400-1500, acute on chronic renal insufficiency. Colon mass found on colonoscopy, planned surgical resection with path pending. We were asked to see for pre-op clearance - pt had troponin up to 1.20, EF 40-45% with WMA.  Also ? of afib this admission but rhythm appears NSR with PACs, occasional nonconducted PACs.   1. Profound anemia secondary to GI bleeding/colon mass 2. NSTEMI likely secondary to demand ischemia in setting of hemmoraghic shock 3. Newly recognized cardiomyopathy with EF 40-45% with high likelihood of CAD 4. HTN 5. Diabetes mellitus  6. Acute on chronic renal failure  7. Marked transaminitis with elevated baseline PT/INR, likely related to shock from anemia   -He has a high probability of obstructive CAD with risk factors for CAD, wall motion abnormality on echo, slightly abnormal troponin in setting of severe anemia. No invasive cardiac workup is recommended at this time in this asymptomatic patient who had a recent life threatening GI bleed and recent renal failure. Cardiac cath not recommended given high risk of bleeding if anticoagulants used with untreated colon mass, recent GI bleed. Would proceed with the surgery as planned. He can f/u in our cardiology office after his surgery and further cardiac workup can be arranged at that time. We will sign off for now. Please call with questions.    [name]  10/30/201311:52 AM

## 2012-06-24 NOTE — Progress Notes (Signed)
Patient ID: [name], male   DOB: [birth year], 78 y.o.   MRN: [id number] 3 Days Post-Op  Subjective: Pt reports no acute changes over night, denies n/v last night, denies flatus or BM.  Objective: Vital signs in last 24 hours: Temp:  [99.2 F (37.3 C)-99.5 F (37.5 C)] 99.5 F (37.5 C) (10/30 0500) Pulse Rate:  [82-84] 84  (10/30 0500) Resp:  [18] 18  (10/30 0500) BP: (144-160)/(73-79) 144/73 mmHg (10/30 0500) SpO2:  [94 %-100 %] 94 % (10/30 0500) Last BM Date: 06/21/12  Intake/Output from previous day: 10/29 0701 - 10/30 0700 In: 480 [P.O.:480] Out: -  Intake/Output this shift:    PE: Abd: softer then yesterday, +bs, mildly tender on right flank and lower abd.  Lab Results:   Basename 06/24/12 0508 06/23/12 0630  WBC PENDING 13.4*  HGB 7.5* 8.0*  HCT 23.7* 25.9*  PLT PENDING 218   BMET  Basename 06/24/12 0508 06/23/12 0630  NA 136 140  K 3.9 3.9  CL 101 104  CO2 26 24  GLUCOSE 145* 174*  BUN 13 13  CREATININE 1.40* 1.30  CALCIUM 8.4 8.7   PT/INR No results found for this basename: LABPROT:2,INR:2 in the last 72 hours CMP     Component Value Date/Time   NA 136 06/24/2012 0508   K 3.9 06/24/2012 0508   CL 101 06/24/2012 0508   CO2 26 06/24/2012 0508   GLUCOSE 145* 06/24/2012 0508   BUN 13 06/24/2012 0508   CREATININE 1.40* 06/24/2012 0508   CREATININE 1.35 03/05/2011 1351   CALCIUM 8.4 06/24/2012 0508   PROT 5.7* 06/24/2012 0508   ALBUMIN 2.7* 06/24/2012 0508   AST 17 06/24/2012 0508   ALT 210* 06/24/2012 0508   ALKPHOS 60 06/24/2012 0508   BILITOT 1.4* 06/24/2012 0508   GFRNONAA 47* 06/24/2012 0508   GFRAA 55* 06/24/2012 0508   Lipase  No results found for this basename: lipase       Studies/Results: Ct Head Wo Contrast  06/23/2012  *RADIOLOGY REPORT*  Clinical Data: Altered mental status.  Weakness  CT HEAD WITHOUT CONTRAST  Technique:  Contiguous axial images were obtained from the base of the skull through the vertex without  contrast.  Comparison: CT 06/18/2012  Findings: Moderate atrophy, unchanged.  Negative for acute infarct. Negative for hemorrhage or mass.  No skull fracture.  IMPRESSION: No acute abnormality and no interval change.   Original Report Authenticated By: Camelia Phenes, M.D.    Dg Abd Portable 1v  06/23/2012  *RADIOLOGY REPORT*  Clinical Data: Nausea and vomiting.  Abdominal distention.  PORTABLE ABDOMEN - 1 VIEW  Comparison: None.  Findings: Mild gaseous distention is noted in the colon.  There is no evidence for obstruction or free air.  Degenerative changes are noted in the lower lumbar spine.  IMPRESSION:  1.  Mild gaseous distention of the colon without evidence for obstruction or free air. 2.  Degenerative changes in the lower lumbar spine.   Original Report Authenticated By: Jamesetta Orleans. MATTERN, M.D.     Anti-infectives: Anti-infectives    None       Assessment/Plan 1. Villous appearing polypoid lesion in the cecum: per Dr. Derrell Lolling, if no acute bleeding then patient can follow up with [place] as outpatient to discuss resection.  Will schedule this for patient.  If hgb stable and no bleeding can discharge home when medicine feels he is ready.    LOS: 6 days    Cythnia Osmun 06/24/2012

## 2012-06-24 NOTE — Progress Notes (Signed)
Clinical Social Worker met with pt at bedside; pt currently sitting upright in his chair.  Pt c/o feeling cold and wishing to be in the bed.  CSW alerted Nursing Staff.  CSW offered to turn the temperature up on the thermostat and gather an extra blanket; pt agreeable.  CSW provided opportunity for pt to process feelings.  Pt stated he has not been able to get in touch with his dtr and he would like his dtr to bring his teeth to the hospital.  CSW offered to phone pt's dtr; phone number listed in chart is not correct and pt unable to remember dtr's phone number.  CSW offered to phone pt's apartment complex and attempt to get in touch with his dtr-pt agreeable.  CSW phoned pt's apartment complex.  Aurea Graff at [employer].  Aurea Graff agreeable to phoning pt's dtr and requesting she bring teeth to hospital.  Aurea Graff to phone CSW back if any problems.  CSW to continue to follow and assist as needed.   Angelia Mould, MSW, [place] [phone]

## 2012-06-24 NOTE — Progress Notes (Signed)
Occupational Therapy Treatment Patient Details Name: [name] MRN: [id number] DOB: Oct 09, 1933 Today's Date: 06/24/2012 Time: 0865-7846 OT Time Calculation (min): 29 min  OT Assessment / Plan / Recommendation Comments on Treatment Session      Follow Up Recommendations       Barriers to Discharge       Equipment Recommendations  Rolling walker with 5" wheels    Recommendations for Other Services    Frequency     Plan      Precautions / Restrictions Precautions Precautions: Fall Restrictions Weight Bearing Restrictions: No   Pertinent Vitals/Pain Pt reports leg weakness but denies any pain    ADL  Grooming: Performed;Wash/dry face;Wash/dry hands;Set up Where Assessed - Grooming: Supported sitting Toilet Transfer: +2 Total assistance Toilet Transfer: Patient Percentage: 80% Toilet Transfer Method: Sit to Barista: Materials engineer and Hygiene: Maximal assistance Where Assessed - Engineer, mining and Hygiene: Sit to stand from 3-in-1 or toilet Equipment Used: Gait belt;Rolling walker Transfers/Ambulation Related to ADLs: +2 pt= 70% with stand pivot transfer EOB to bedside to chair ADL Comments: Pt fatigues quickly    OT Diagnosis:    OT Problem List:   OT Treatment Interventions:     OT Goals ADL Goals ADL Goal: Upper Body Bathing - Progress: Progressing toward goals ADL Goal: Lower Body Dressing - Progress: Progressing toward goals ADL Goal: Toilet Transfer - Progress: Progressing toward goals ADL Goal: Toileting - Clothing Manipulation - Progress: Progressing toward goals  Visit Information  Last OT Received On: 06/24/12 Assistance Needed: +2    Subjective Data      Prior Functioning       Cognition  Overall Cognitive Status: Impaired Arousal/Alertness: Awake/alert Orientation Level: Appears intact for tasks assessed Behavior During Session: [facility] for tasks  performed Cognition - Other Comments: pt easily distracted and at times difficulty with finding words- unsure of baseline    Mobility  Shoulder Instructions Bed Mobility Bed Mobility: Supine to Sit;Sitting - Scoot to Edge of Bed Supine to Sit: 4: Min assist;With rails Sitting - Scoot to Edge of Bed: 5: Supervision Details for Bed Mobility Assistance: cues for encouragement and safe technique.   Transfers Sit to Stand: 1: +2 Total assist;With upper extremity assist;From bed;From chair/3-in-1;With armrests Sit to Stand: Patient Percentage: 70% Stand to Sit: 1: +2 Total assist;With upper extremity assist;To chair/3-in-1;With armrests Stand to Sit: Patient Percentage: 80% Details for Transfer Assistance: cues for safe technique, sequencing, attending to task.  pt gets very distracted and frustrated by situation with family.         Exercises      Balance Balance Balance Assessed: Yes Static Standing Balance Static Standing - Balance Support: Bilateral upper extremity supported Static Standing - Level of Assistance: 4: Min assist Static Standing - Comment/# of Minutes: With Bil UE support pt able to maintain standing balance during peri hygiene with only MinA.     End of Session    GO     [name] 06/24/2012, 12:48 PM

## 2012-06-24 NOTE — Progress Notes (Signed)
Agree w/ PA-[facility] PN NO need for urgent/emergent surgery.  Will need outpt set up for colon resection since pt has high grade dysplasia only. -OK for home and f/u with me to schedule for surgery

## 2012-06-24 NOTE — Progress Notes (Signed)
Physical Therapy Treatment Patient Details Name: [name] MRN: [id number] DOB: September 29, 1933 Today's Date: 06/24/2012 Time: 0865-7846 PT Time Calculation (min): 25 min  PT Assessment / Plan / Recommendation Comments on Treatment Session  pt rpesents with  Anemia, Colon Mass, found down at home after 2 days.  pt very frustrated today by family not checking on him for days and now not visiting in hospital.  pt needs re-direction at times to attend back to tasks instead of frustrations with family.  pt will still need SNF at D/C and SW A with management of finances and personal belongings at home.      Follow Up Recommendations  Post acute inpatient     Does the patient have the potential to tolerate intense rehabilitation  No, Recommend SNF  Barriers to Discharge        Equipment Recommendations  Rolling walker with 5" wheels    Recommendations for Other Services    Frequency Min 3X/week   Plan Discharge plan remains appropriate;Frequency remains appropriate    Precautions / Restrictions Precautions Precautions: Fall Restrictions Weight Bearing Restrictions: No   Pertinent Vitals/Pain Denies pain, but c/o feeling weak and tired.    Mobility  Bed Mobility Bed Mobility: Supine to Sit;Sitting - Scoot to Edge of Bed Supine to Sit: 4: Min assist;With rails Sitting - Scoot to Edge of Bed: 5: Supervision Details for Bed Mobility Assistance: cues for encouragement and safe technique.   Transfers Transfers: Sit to Stand;Stand to Sit;Stand Pivot Transfers Sit to Stand: 1: +2 Total assist;With upper extremity assist;From bed;From chair/3-in-1;With armrests Sit to Stand: Patient Percentage: 70% Stand to Sit: 1: +2 Total assist;With upper extremity assist;To chair/3-in-1;With armrests Stand to Sit: Patient Percentage: 80% Stand Pivot Transfers: 1: +2 Total assist Stand Pivot Transfers: Patient Percentage: 70% Details for Transfer Assistance: cues for safe technique,  sequencing, attending to task.  pt gets very distracted and frustrated by situation with family.   Ambulation/Gait Ambulation/Gait Assistance: Not tested (comment) (pt declined ambulating) Stairs: No Wheelchair Mobility Wheelchair Mobility: No    Exercises     PT Diagnosis:    PT Problem List:   PT Treatment Interventions:     PT Goals Acute Rehab PT Goals Time For Goal Achievement: 07/06/12 PT Goal: Supine/Side to Sit - Progress: Progressing toward goal PT Goal: Sit to Stand - Progress: Progressing toward goal PT Goal: Stand to Sit - Progress: Progressing toward goal  Visit Information  Last PT Received On: 06/24/12 Assistance Needed: +2 PT/OT Co-Evaluation/Treatment: Yes    Subjective Data  Subjective: I need someone to go to my apartment and get my things.  I'm through with my daughter.     Cognition  Overall Cognitive Status: Appears within functional limits for tasks assessed/performed Arousal/Alertness: Awake/alert Orientation Level: Appears intact for tasks assessed Behavior During Session: [facility] for tasks performed Cognition - Other Comments: pt seems intact, but ? pt simple at baseline.      Balance  Balance Balance Assessed: Yes Static Standing Balance Static Standing - Balance Support: Bilateral upper extremity supported Static Standing - Level of Assistance: 4: Min assist Static Standing - Comment/# of Minutes: With Bil UE support pt able to maintain standing balance during peri hygiene with only MinA.    End of Session PT - End of Session Equipment Utilized During Treatment: Gait belt Activity Tolerance: Patient limited by fatigue Patient left: in chair;with call bell/phone within reach Nurse Communication: Mobility status   GP     [name], Alison Murray,  PT [phone] 06/24/2012, 10:47 AM

## 2012-06-24 NOTE — Progress Notes (Signed)
[facility] PROGRESS NOTE  [name] MVH:[id number] DOB: 1933-11-21 DOA: 06/18/2012 PCP: Carollee Herter, MD  Assessment/Plan:  GI bleed.  No obvious source of bleeding on EGD 10/25.  Patient found to have a large cecal polyp on colonoscopy on 10/27 which was biopsied by Dr. Loreta Ave.  Pathology reveals tubular adenoma without high grade dysplasia, however, polyp was not removed during procedure.  Patient passing gas with mild tenderness in suprapubic region.  No ileus on KUB. -  Continue protonix bid  -  Trend hemoglobin -  Continue PRN zofran, reglan for nausea  -  Surgery appreciated-needs outpatient removal of polyp per Dr. Derrell Lolling is note appreciate assistance -  Monitor for ileus   CHF, likely ICM, EF 40% to 45% with severe hypokinesis of the mid-distalanteroseptal myocardium.  Also had positive troponins, likely demand ischemia, peak of 1.2.  Hypervolemic on exam -  Cardiology consultation-appreciate assistance, outpatient reevaluation needed -  Restart lasix and low dose lisinopril as creatinine as stabilized -  On beta blocker and restarting statin -  Hold ASA due to recent GI bleed  PAF on admission, currently in atrial fibrillation on telemetry,  CHADS 2 of 4.  Currently not anticoagulated 2/2 recent GIB. -  BP elevated so changed metoprolol to labetalol, but monitor for tachycardia on monitor -  Continue telemetry  Hx of HTN, hyperlipidemia.  Blood pressure very elevated -  Restart home dose lasix and low-dose lisinopril -  Continue lopressor  -  Hold HCTZ    Leukocytosis - may be stress reaction from recent biopsy.  Urinalysis from yesterday benign.  No cough.  KUB without evidence of free air.  If develops cough, consider CXR -  Trend WBC -   Culture and CXR if febrile  Elevated LFT's. Likely related to shock. Improving.  -  Trend LFT's , these seem to be trending down and may be secondary to central venous congestion in the liver, bilirubin is  slightly elevated therefore repeat this once more in the morning. Creatinine has risen slightly as well.  Acute blood loss anemia: s/p 4 u pRBC. CBC stable  -  Trend hgb -  Transfuse for Hb < 7 or active bleeding   Acute renal failure.  Creatinine stable at 1.3.   -  Trend creatinine, could be secondary to anemia blood loss recheck in the morning  Hx of BPH.  -  Restart terazosin  Type 2 DM.  Fingersticks at goal between 140 and 180 -  SSI  -  Avoid metformin use with renal dysfx   Hx of depression.  Paxil discontinued on home MAR. Monitor symptoms   DIET:  Healthy heart ACCESS:  PIV IVF:  None PROPH:  SCDs   Code Status: Full code Family Communication:  Attempting to call family, but phone number incorrect on chart.  Updated daughter fully at bedside Disposition Plan: Likely discharge in one to 2 days   Consultants:  GI  Critical care  Cardiology  Procedures:  EGD 10/25  Colonoscopy 10/27  Antibiotics:  None  HPI/Subjective:  Patient states doing well. Patient has finished record of his meal at bedside on his plate. He has no other issues. He states he can't find his teeth in cannot eat. He has no chest pain no nausea no shortness breath no   Objective: Filed Vitals:   06/23/12 1400 06/23/12 2148 06/24/12 0500 06/24/12 1400  BP: 152/78 160/79 144/73 133/73  Pulse: 82 84 84 86  Temp: 99.2 F (37.3 C)  99.4 F (37.4 C) 99.5 F (37.5 C) 98 F (36.7 C)  TempSrc:      Resp: 18 18 18 18   Height:      Weight:      SpO2: 100% 98% 94% 100%   No intake or output data in the 24 hours ending 06/24/12 2000 Filed Weights   06/20/12 0400 06/21/12 0451 06/22/12 0500  Weight: 112 kg (246 lb 14.6 oz) 117.7 kg (259 lb 7.7 oz) 119.2 kg (262 lb 12.6 oz)    Exam:   General:   [name], no acute distress, sitting in bed  HEENT:  MMM, facial twitch on left side is chronic per patient  Cardiovascular:  Irregularly irregular, tachycardic, no S1, S2, 2+  pulses  Respiratory: CTAB anteriorly  Abdomen:  Mildly distended, hypoactive but present BS, soft, tender to palpation over suprapubic region without rebound or guarding  MSK:  3+ LEE with hyperpigmentation and hair loss on legs  Data Reviewed: Basic Metabolic Panel:  Lab 06/24/12 4098 06/23/12 0630 06/22/12 1000 06/21/12 0440 06/20/12 0800 06/19/12 0933  NA 136 140 139 138 143 --  K 3.9 3.9 4.0 4.0 3.9 --  CL 101 104 106 103 107 --  CO2 26 24 26 26 28  --  GLUCOSE 145* 174* 103* 145* 95 --  BUN 13 13 15  26* 36* --  CREATININE 1.40* 1.30 1.34 1.54* 1.75* --  CALCIUM 8.4 8.7 8.5 8.6 8.6 --  MG -- -- -- -- -- 2.3  PHOS -- -- -- -- -- 3.8   Liver Function Tests:  Lab 06/24/12 0508 06/23/12 0630 06/22/12 1000 06/21/12 0440 06/20/12 0800  AST 17 28 57* 192* 448*  ALT 210* 344* 486* 799* 1030*  ALKPHOS 60 70 67 67 59  BILITOT 1.4* 1.4* 1.2 1.0 1.0  PROT 5.7* 6.3 6.1 6.2 6.1  ALBUMIN 2.7* 3.1* 3.1* 3.3* 3.3*   No results found for this basename: LIPASE:5,AMYLASE:5 in the last 168 hours No results found for this basename: AMMONIA:5 in the last 168 hours CBC:  Lab 06/24/12 0508 06/23/12 0630 06/21/12 0815 06/20/12 2001 06/20/12 0800  WBC 13.6* 13.4* 6.1 7.5 7.5  NEUTROABS -- -- -- -- --  HGB 7.5* 8.0* 7.4* 7.7* 7.3*  HCT 23.7* 25.9* 24.1* 24.8* 23.2*  MCV 77.7* 77.8* 77.5* 77.5* 77.1*  PLT 312 218 101* 73* 74*   Cardiac Enzymes:  Lab 06/19/12 0940 06/19/12 0933 06/18/12 2135 06/18/12 1412  CKTOTAL -- 184 -- 181  CKMB -- -- -- 6.3*  CKMBINDEX -- -- -- --  TROPONINI 1.07* 1.20* 0.58* 0.64*   BNP (last 3 results) No results found for this basename: PROBNP:3 in the last 8760 hours CBG:  Lab 06/24/12 1636 06/24/12 1114 06/24/12 0726 06/23/12 2115 06/23/12 1648  GLUCAP 181* 193* 154* 156* 175*    Recent Results (from the past 240 hour(s))  MRSA PCR SCREENING     Status: Normal   Collection Time   06/18/12  7:44 PM      Component Value Range Status Comment   MRSA by  PCR NEGATIVE  NEGATIVE Final   CULTURE, BLOOD (ROUTINE X 2)     Status: Normal (Preliminary result)   Collection Time   06/18/12  9:36 PM      Component Value Range Status Comment   Specimen Description BLOOD RIGHT HAND   Final    Special Requests BOTTLES DRAWN AEROBIC ONLY 8CC   Final    Culture  Setup Time 06/19/2012 01:52   Final  Culture     Final    Value:        BLOOD CULTURE RECEIVED NO GROWTH TO DATE CULTURE WILL BE HELD FOR 5 DAYS BEFORE ISSUING A FINAL NEGATIVE REPORT   Report Status PENDING   Incomplete   CULTURE, BLOOD (ROUTINE X 2)     Status: Normal (Preliminary result)   Collection Time   06/18/12  9:36 PM      Component Value Range Status Comment   Specimen Description BLOOD LEFT HAND   Final    Special Requests BOTTLES DRAWN AEROBIC ONLY 4CC   Final    Culture  Setup Time 06/19/2012 01:52   Final    Culture     Final    Value:        BLOOD CULTURE RECEIVED NO GROWTH TO DATE CULTURE WILL BE HELD FOR 5 DAYS BEFORE ISSUING A FINAL NEGATIVE REPORT   Report Status PENDING   Incomplete      Studies: Ct Head Wo Contrast  06/23/2012  *RADIOLOGY REPORT*  Clinical Data: Altered mental status.  Weakness  CT HEAD WITHOUT CONTRAST  Technique:  Contiguous axial images were obtained from the base of the skull through the vertex without contrast.  Comparison: CT 06/18/2012  Findings: Moderate atrophy, unchanged.  Negative for acute infarct. Negative for hemorrhage or mass.  No skull fracture.  IMPRESSION: No acute abnormality and no interval change.   Original Report Authenticated By: Camelia Phenes, M.D.    Dg Abd Portable 1v  06/23/2012  *RADIOLOGY REPORT*  Clinical Data: Nausea and vomiting.  Abdominal distention.  PORTABLE ABDOMEN - 1 VIEW  Comparison: None.  Findings: Mild gaseous distention is noted in the colon.  There is no evidence for obstruction or free air.  Degenerative changes are noted in the lower lumbar spine.  IMPRESSION:  1.  Mild gaseous distention of the colon  without evidence for obstruction or free air. 2.  Degenerative changes in the lower lumbar spine.   Original Report Authenticated By: Jamesetta Orleans. MATTERN, M.D.     Scheduled Meds:    . atorvastatin  20 mg Oral q1800  . furosemide  40 mg Oral Daily  . insulin aspart  0-15 Units Subcutaneous TID WC & HS  . labetalol  200 mg Oral BID  . lisinopril  5 mg Oral Daily  . ondansetron (ZOFRAN) IV  4 mg Intravenous Q6H  . pantoprazole (PROTONIX) IV  40 mg Intravenous Q12H  . terazosin  5 mg Oral QHS   Continuous Infusions:   Principal Problem:  *Anemia associated with acute blood loss Active Problems:  Diabetes mellitus  Hypertension  Hyperlipemia  Chronic acquired lymphedema  GI bleed  Transaminitis  CHF (congestive heart failure)  Atrial fibrillation  Leukocytosis  Polyp of colon  Pre-operative cardiovascular examination    Time spent: 45    Mahala Menghini [facility]  [facility] Pager [phone]. If 8PM-8AM, please contact night-coverage at [email or web address], password [facility] 06/24/2012, 8:00 PM  LOS: 6 days

## 2012-06-25 LAB — COMPREHENSIVE METABOLIC PANEL
ALT: 155 U/L — ABNORMAL HIGH (ref 0–53)
AST: 12 U/L (ref 0–37)
Calcium: 8.3 mg/dL — ABNORMAL LOW (ref 8.4–10.5)
Sodium: 137 mEq/L (ref 135–145)
Total Protein: 5.8 g/dL — ABNORMAL LOW (ref 6.0–8.3)

## 2012-06-25 LAB — CULTURE, BLOOD (ROUTINE X 2)
Culture: NO GROWTH
Culture: NO GROWTH

## 2012-06-25 LAB — CBC WITH DIFFERENTIAL/PLATELET
Basophils Relative: 0 % (ref 0–1)
Eosinophils Absolute: 0 10*3/uL (ref 0.0–0.7)
Eosinophils Relative: 0 % (ref 0–5)
HCT: 23.2 % — ABNORMAL LOW (ref 39.0–52.0)
Hemoglobin: 7 g/dL — ABNORMAL LOW (ref 13.0–17.0)
Lymphocytes Relative: 14 % (ref 12–46)
MCH: 23.3 pg — ABNORMAL LOW (ref 26.0–34.0)
MCHC: 30.2 g/dL (ref 30.0–36.0)
Monocytes Absolute: 2.2 10*3/uL — ABNORMAL HIGH (ref 0.1–1.0)
Neutro Abs: 8.8 10*3/uL — ABNORMAL HIGH (ref 1.7–7.7)

## 2012-06-25 LAB — GLUCOSE, CAPILLARY
Glucose-Capillary: 132 mg/dL — ABNORMAL HIGH (ref 70–99)
Glucose-Capillary: 196 mg/dL — ABNORMAL HIGH (ref 70–99)
Glucose-Capillary: 159 mg/dL — ABNORMAL HIGH (ref 70–99)

## 2012-06-25 LAB — BASIC METABOLIC PANEL
CO2: 27 mEq/L (ref 19–32)
Calcium: 8.5 mg/dL (ref 8.4–10.5)
GFR calc Af Amer: 50 mL/min — ABNORMAL LOW (ref >90)
Sodium: 137 mEq/L (ref 135–145)

## 2012-06-25 LAB — PATHOLOGIST SMEAR REVIEW

## 2012-06-25 LAB — HEMOGLOBIN: Hemoglobin: 7.1 g/dL — ABNORMAL LOW (ref 13.0–17.0)

## 2012-06-25 MED ORDER — DOCUSATE SODIUM 100 MG PO CAPS
100.0000 mg | ORAL_CAPSULE | Freq: Two times a day (BID) | ORAL | Status: DC
  Administered 2012-06-25 – 2012-06-26 (×3): 100 mg via ORAL
  Filled 2012-06-25 (×4): qty 1

## 2012-06-25 MED ORDER — ACETAMINOPHEN 325 MG PO TABS
650.0000 mg | ORAL_TABLET | ORAL | Status: DC | PRN
  Filled 2012-06-25: qty 2

## 2012-06-25 MED ORDER — BISACODYL 5 MG PO TBEC
10.0000 mg | DELAYED_RELEASE_TABLET | Freq: Once | ORAL | Status: AC
  Administered 2012-06-25: 10 mg via ORAL
  Filled 2012-06-25: qty 2

## 2012-06-25 MED ORDER — HYDROCODONE-ACETAMINOPHEN 5-325 MG PO TABS
1.0000 | ORAL_TABLET | Freq: Four times a day (QID) | ORAL | Status: DC | PRN
  Filled 2012-06-25: qty 1

## 2012-06-25 NOTE — Progress Notes (Signed)
[facility] PROGRESS NOTE  [name] FAO:[id number] DOB: [birth year] DOA: 06/18/2012 PCP: Carollee Herter, MD  Assessment/Plan:  GI bleed.  No obvious source of bleeding on EGD 10/25.  Patient found to have a large cecal polyp on colonoscopy on 10/27 which was biopsied by Dr. Loreta Ave.  Pathology reveals tubular adenoma without high grade dysplasia, however, polyp was not removed during procedure.  Patient passing gas with mild tenderness in suprapubic region.  No ileus on KUB. - Continue protonix bid  - Trend hemoglobin--this has actually trended to a Nadir of 7.0 which was re-confirmed 10/31 with a  Rpt Hb--If the patient drops HCT again, would re-consult Gen surgery for potential removal of the same - Continue PRN zofran, reglan for nausea  -Passing stool   CHF, likely ICM, EF 40% to 45% with severe hypokinesis of the mid-distalanteroseptal myocardium.  Also had positive troponins, likely demand ischemia, peak of 1.2.  Hypervolemic on exam -  Cardiology consultation-appreciate assistance, outpatient reevaluation needed -  Restart lasix and low dose lisinopril as creatinine as stabilized -  On beta blocker and restarting statin -  Hold ASA due to recent GI bleed  PAF on admission, currently in atrial fibrillation on telemetry,  CHADS 2 of 4.  Currently not anticoagulated 2/2 recent GIB. -  BP elevated so changed metoprolol to labetalol, but monitor for tachycardia on monitor -  Continue telemetry  Hx of HTN, hyperlipidemia.  Blood pressure very elevated -  Restart home dose lasix 40mg  and low-dose lisinopril 5mg  -  Continue labetalol 20 prn/ and Labetalol 200 mg bid  -  Held HCTZ    Leukocytosis - may be stress reaction from recent biopsy.  Urinalysis from yesterday benign.  No cough.  KUB without evidence of free air.  If develops cough, consider CXR -  Trend WBC -   Culture and CXR if febrile  Elevated LFT's. Likely related to shock. Improving.  -  Trend LFT's ,  these seem to be trending down and may be secondary to central venous congestion in the liver, bilirubin is slightly elevated therefore repeat this once more in the morning. Creatinine has risen slightly as well.  Acute blood loss anemia: s/p 4 u pRBC. CBC stable  -  Trend hgb -  Transfuse for Hb < 7 or active bleeding-see above  Acute renal failure.  Creatinine stable at 1.3.   -  Trend creatinine, could be secondary to anemia blood loss recheck in the morning  Hx of BPH.  -  Restart terazosin  Type 2 DM.  Fingersticks at goal between 140 and 180 -  SSI  -  Avoid metformin use with renal dysfx   Hx of depression.  Paxil discontinued on home MAR. Monitor symptoms   DIET:  Healthy heart ACCESS:  PIV IVF:  None PROPH:  SCDs   Code Status: Full code Family Communication:  None at bedside Disposition Plan: Likely discharge in one to 2 days   Consultants:  GI  Critical care  Cardiology  Procedures:  EGD 10/25  Colonoscopy 10/27  Antibiotics:  None  HPI/Subjective:  Patient states doing well. No new issues.  No nauseas no vomiting.  Nursing reports smears of stool, none are bloody.  NO CP or SOB  Objective: Filed Vitals:   06/25/12 0022 06/25/12 0023 06/25/12 0641 06/25/12 1400  BP: 151/76  164/77 169/82  Pulse:  85 85 82  Temp:   100.2 F (37.9 C) 99.3 F (37.4 C)  TempSrc:  Oral   Resp:   18 18  Height:      Weight:      SpO2:   98% 98%    Intake/Output Summary (Last 24 hours) at 06/25/12 1754 Last data filed at 06/25/12 1610  Gross per 24 hour  Intake      0 ml  Output    300 ml  Net   -300 ml   Filed Weights   06/20/12 0400 06/21/12 0451 06/22/12 0500  Weight: 112 kg (246 lb 14.6 oz) 117.7 kg (259 lb 7.7 oz) 119.2 kg (262 lb 12.6 oz)    Exam:   General:   [name], no acute distress, sitting in bed  HEENT:  MMM, facial twitch on left side is chronic per patient  Cardiovascular:  Irregularly irregular, tachycardic, no S1, S2, 2+  pulses  Respiratory: CTAB anteriorly  Abdomen:  Mildly distended, hypoactive but present BS, soft, tender to palpation over suprapubic region without rebound or guarding  MSK:  3+ LEE with hyperpigmentation and hair loss on legs  Data Reviewed: Basic Metabolic Panel:  Lab 06/25/12 9604 06/25/12 0505 06/24/12 0508 06/23/12 0630 06/22/12 1000 06/19/12 0933  NA 137 137 136 140 139 --  K 4.3 4.1 3.9 3.9 4.0 --  CL 102 102 101 104 106 --  CO2 27 26 26 24 26  --  GLUCOSE 174* 139* 145* 174* 103* --  BUN 17 16 13 13 15  --  CREATININE 1.51* 1.51* 1.40* 1.30 1.34 --  CALCIUM 8.5 8.3* 8.4 8.7 8.5 --  MG -- -- -- -- -- 2.3  PHOS -- -- -- -- -- 3.8   Liver Function Tests:  Lab 06/25/12 0505 06/24/12 0508 06/23/12 0630 06/22/12 1000 06/21/12 0440  AST 12 17 28  57* 192*  ALT 155* 210* 344* 486* 799*  ALKPHOS 63 60 70 67 67  BILITOT 1.0 1.4* 1.4* 1.2 1.0  PROT 5.8* 5.7* 6.3 6.1 6.2  ALBUMIN 2.6* 2.7* 3.1* 3.1* 3.3*   No results found for this basename: LIPASE:5,AMYLASE:5 in the last 168 hours No results found for this basename: AMMONIA:5 in the last 168 hours CBC:  Lab 06/25/12 1206 06/25/12 0505 06/24/12 0508 06/23/12 0630 06/21/12 0815 06/20/12 2001  WBC -- 12.8* 13.6* 13.4* 6.1 7.5  NEUTROABS -- 8.8* -- -- -- --  HGB 7.1* 7.0* 7.5* 8.0* 7.4* --  HCT -- 23.2* 23.7* 25.9* 24.1* 24.8*  MCV -- 77.1* 77.7* 77.8* 77.5* 77.5*  PLT -- 387 312 218 101* 73*   Cardiac Enzymes:  Lab 06/19/12 0940 06/19/12 0933 06/18/12 2135  CKTOTAL -- 184 --  CKMB -- -- --  CKMBINDEX -- -- --  TROPONINI 1.07* 1.20* 0.58*   BNP (last 3 results) No results found for this basename: PROBNP:3 in the last 8760 hours CBG:  Lab 06/25/12 1630 06/25/12 1119 06/25/12 0722 06/24/12 2026 06/24/12 1636  GLUCAP 148* 196* 132* 142* 181*    Recent Results (from the past 240 hour(s))  MRSA PCR SCREENING     Status: Normal   Collection Time   06/18/12  7:44 PM      Component Value Range Status Comment   MRSA  by PCR NEGATIVE  NEGATIVE Final   CULTURE, BLOOD (ROUTINE X 2)     Status: Normal   Collection Time   06/18/12  9:36 PM      Component Value Range Status Comment   Specimen Description BLOOD RIGHT HAND   Final    Special Requests BOTTLES DRAWN AEROBIC ONLY 8CC  Final    Culture  Setup Time 06/19/2012 01:52   Final    Culture NO GROWTH 5 DAYS   Final    Report Status 06/25/2012 FINAL   Final   CULTURE, BLOOD (ROUTINE X 2)     Status: Normal   Collection Time   06/18/12  9:36 PM      Component Value Range Status Comment   Specimen Description BLOOD LEFT HAND   Final    Special Requests BOTTLES DRAWN AEROBIC ONLY 4CC   Final    Culture  Setup Time 06/19/2012 01:52   Final    Culture NO GROWTH 5 DAYS   Final    Report Status 06/25/2012 FINAL   Final      Studies: No results found.  Scheduled Meds:    . atorvastatin  20 mg Oral q1800  . bisacodyl  10 mg Oral Once  . docusate sodium  100 mg Oral BID  . furosemide  40 mg Oral Daily  . insulin aspart  0-15 Units Subcutaneous TID WC & HS  . labetalol  200 mg Oral BID  . lisinopril  5 mg Oral Daily  . ondansetron (ZOFRAN) IV  4 mg Intravenous Q6H  . pantoprazole (PROTONIX) IV  40 mg Intravenous Q12H  . terazosin  5 mg Oral QHS   Continuous Infusions:   Principal Problem:  *Anemia associated with acute blood loss Active Problems:  Diabetes mellitus  Hypertension  Hyperlipemia  Chronic acquired lymphedema  GI bleed  Transaminitis  CHF (congestive heart failure)  Atrial fibrillation  Leukocytosis  Polyp of colon  Pre-operative cardiovascular examination    Time spent: 45    Mahala Menghini [facility]  [facility] Pager [phone]. If 8PM-8AM, please contact night-coverage at [email or web address], password [facility] 06/25/2012, 5:54 PM  LOS: 7 days

## 2012-06-25 NOTE — Progress Notes (Signed)
Clinical Social Worker met with pt and reviewed bed offers.  Pt has selected [facility] as his SNF.  CSW contacted [facility].  CSW to continue to follow and assist as needed.    Angelia Mould, MSW, [place] [phone]

## 2012-06-26 LAB — GLUCOSE, CAPILLARY: Glucose-Capillary: 205 mg/dL — ABNORMAL HIGH (ref 70–99)

## 2012-06-26 LAB — COMPREHENSIVE METABOLIC PANEL
AST: 11 U/L (ref 0–37)
Albumin: 2.7 g/dL — ABNORMAL LOW (ref 3.5–5.2)
BUN: 14 mg/dL (ref 6–23)
Calcium: 8.5 mg/dL (ref 8.4–10.5)
Chloride: 102 mEq/L (ref 96–112)
Creatinine, Ser: 1.48 mg/dL — ABNORMAL HIGH (ref 0.50–1.35)
Total Protein: 6.1 g/dL (ref 6.0–8.3)

## 2012-06-26 LAB — CBC
HCT: 23.3 % — ABNORMAL LOW (ref 39.0–52.0)
MCH: 23.7 pg — ABNORMAL LOW (ref 26.0–34.0)
MCHC: 30.9 g/dL (ref 30.0–36.0)
MCV: 76.6 fL — ABNORMAL LOW (ref 78.0–100.0)
Platelets: 466 10*3/uL — ABNORMAL HIGH (ref 150–400)
RDW: 27.8 % — ABNORMAL HIGH (ref 11.5–15.5)
WBC: 10.7 10*3/uL — ABNORMAL HIGH (ref 4.0–10.5)

## 2012-06-26 LAB — PREPARE RBC (CROSSMATCH)

## 2012-06-26 MED ORDER — LABETALOL HCL 200 MG PO TABS: 200.0000 mg | ORAL_TABLET | Freq: Two times a day (BID) | ORAL | Status: DC

## 2012-06-26 MED ORDER — FUROSEMIDE 10 MG/ML IJ SOLN
20.0000 mg | Freq: Once | INTRAMUSCULAR | Status: AC
  Administered 2012-06-26: 20 mg via INTRAVENOUS

## 2012-06-26 MED ORDER — FUROSEMIDE 10 MG/ML IJ SOLN
40.0000 mg | Freq: Once | INTRAMUSCULAR | Status: AC
  Administered 2012-06-26: 40 mg via INTRAVENOUS
  Filled 2012-06-26 (×2): qty 4

## 2012-06-26 MED ORDER — FUROSEMIDE 40 MG PO TABS: 40.0000 mg | ORAL_TABLET | Freq: Two times a day (BID) | ORAL | Status: DC

## 2012-06-26 MED ORDER — PANTOPRAZOLE SODIUM 40 MG PO TBEC: 40.0000 mg | DELAYED_RELEASE_TABLET | Freq: Every day | ORAL | Status: DC

## 2012-06-26 MED ORDER — DIPHENHYDRAMINE HCL 25 MG PO CAPS
25.0000 mg | ORAL_CAPSULE | Freq: Once | ORAL | Status: AC
  Administered 2012-06-26: 25 mg via ORAL
  Filled 2012-06-26: qty 1

## 2012-06-26 NOTE — Progress Notes (Signed)
Pt's IV out of date. Pt refused to have it restarted.

## 2012-06-26 NOTE — Discharge Summary (Signed)
Physician Discharge Summary  [name]:[id number] DOB: 03/26/1934 DOA: 06/18/2012  PCP: Carollee Herter, MD  Admit date: 06/18/2012 Discharge date: 06/26/2012  Time spent: 35 minutes  Recommendations for Outpatient Follow-up:  1. Needs close coordination with multiple physicians for followup of his anemia-this is suspected to be secondary to a polyp that was not excised on colonoscopy on this admission-and gastroenterology and general surgery will need to see him in the near future. 2. Needs cardiology input in about a month 3. Needs basic metabolic panel in about 5 days-needs daily weights. Please individualize the dose of his Lasix 4. Needs complete blood count probably in 5 days -Was transfused 06/26/2012    Discharge Diagnoses:  Principal Problem:  *Anemia associated with acute blood loss Active Problems:  Diabetes mellitus  Hypertension  Hyperlipemia  Chronic acquired lymphedema  GI bleed  Transaminitis  CHF (congestive heart failure)  Atrial fibrillation  Leukocytosis  Polyp of colon  Pre-operative cardiovascular examination   Discharge Condition: stable  Diet recommendation: low salt heart healthy  Filed Weights   06/20/12 0400 06/21/12 0451 06/22/12 0500  Weight: 112 kg (246 lb 14.6 oz) 117.7 kg (259 lb 7.7 oz) 119.2 kg (262 lb 12.6 oz)    History of present illness:  78 year old male with history of hypertension diabetes and narcolepsy was admitted to the ICU on 06/18/2012 with a syncopal episode and found to have a hemoglobin of 3.6 and noticed dark and tarry stools. He was found to have severe lactic acidosis thought likely multifactorial and may be next secondary to metformin use. At time of admission he was found to be in atrial fibrillation, he had a mildly elevated troponin as well thought secondary to demand ischemia, which was reflected in him having also chronic venous congestion of the liver secondary to overt shock liver Patient's care  was assumed by [facility] 06/23/2012. Please see below for a synopsis of the same  Hospital Course:  GI bleed. No obvious source of bleeding on EGD 10/25. Patient found to have a large cecal polyp on colonoscopy on 10/27 which was biopsied by Dr. Loreta Ave. Pathology reveals tubular adenoma without high grade dysplasia, however, polyp was not removed during procedure. Patient passing gas with mild tenderness in suprapubic region. No ileus on KUB.  - Continue protonix bid-this will need to be continued for at least one month and then changed to once daily - Hemoglobin remained in the low 8-7 range and as such one unit of packed red blood cells was transfused on 06/26/2012. Patient was not symptomatic in any way from this.  - Surgery consulted per Dr. Kenna Gilbert request for removal of polyp-I. did discuss personally with Gen. surgery PA about the patient's care and they have a followup schedule 07/09/2012 4 consideration of removal of the polyp. It would be good to know if patient has had a capsule endoscopy in the past as general surgery per my discussion did not feel that this polyp was the source of the bleeding. All appointments have been recommended-please ensure that he follows up and keeps his appointment    CHF, likely ICM, EF 40% to 45% with severe hypokinesis of the mid-distalanteroseptal myocardium. Also had positive troponins, likely demand ischemia, peak of 1.2. Hypervolemic on exam  - Cardiology consultation was performed - Restart lasix 40 mg on d/c twice daily and low dose lisinopril as creatinine as stabilized  - On beta blocker and restarting statin  -Patient was held off on his aspirin given his profound  anemia and will probably need reconsideration of the same as an outpatient.  PAF on admission, currently in atrial fibrillation on telemetry, CHADS 2 of 4. Currently not anticoagulated 2/2 recent GIB.  - BP elevated so changed metoprolol to labetalol, but monitor for  tachycardia-patient will need close followup.  Hx of HTN, hyperlipidemia. Blood pressure very elevated  - Restarted home dose lasix and low-dose lisinopril  - Continue lopressor   Leukocytosis - may be stress reaction from recent biopsy. Urinalysis from yesterday benign. No cough. KUB without evidence of free air.   Elevated LFT's. Likely related to shock. Improving.  - Trend LFT's as an outpatient with LFTs added to complete metabolic panel probably in 1 week or 5 days  Acute blood loss anemia: s/p 4 u pRBC. CBC stable  -Transfused one more unit of packed red blood cells 06/26/2012 and instructed to keep close followup with gastroenterology, general surgery   Acute renal failure. Creatinine stable at 1.3.   Hx of BPH.  - Restart terazosin   Type 2 DM. Fingersticks at goal between 140 and 180  - SSI  - Avoid metformin use with renal dysfx-cautiously restarted on all of his home meds. Repeat a basic metabolic panel in 5 days  Hx of depression. Paxil discontinued on home MAR. Monitor symptoms   Consultants:  GI  Critical care  Cardiology Procedures:  EGD 10/25  Colonoscopy 10/27 Antibiotics:  None  Discharge Exam: Filed Vitals:   06/26/12 1610 06/26/12 1012 06/26/12 1230 06/26/12 1305  BP: 155/72 135/50 149/62 142/56  Pulse: 82 79 79 76  Temp:   98.2 F (36.8 C) 97.8 F (36.6 C)  TempSrc:   Oral Oral  Resp:   21 20  Height:      Weight:      SpO2:        Discharge Instructions  Discharge Orders    Future Appointments: Provider: Department: Dept Phone: Center:   07/09/2012 11:20 AM Axel Filler, MD [facility] [phone] None     Future Orders Please Complete By Expires   Diet - low sodium heart healthy      Increase activity slowly      Discharge instructions      Comments:   Daily weights Increase lasix doe if gains >3 pounds over 48 hours Needs close outpatient follow up       Medication List     As of 06/26/2012  2:02 PM    STOP taking  these medications         aspirin 81 MG tablet      TAKE these medications         acetaminophen 325 MG tablet   Commonly known as: TYLENOL   Take 650 mg by mouth every 6 (six) hours as needed. For pain      bisacodyl 5 MG EC tablet   Commonly known as: DULCOLAX   Take 5 mg by mouth daily as needed. For constipation      famotidine 20 MG tablet   Commonly known as: PEPCID   Take 20 mg by mouth 2 (two) times daily.      furosemide 40 MG tablet   Commonly known as: LASIX   Take 1 tablet (40 mg total) by mouth 2 (two) times daily.      glipiZIDE 10 MG tablet   Commonly known as: GLUCOTROL   Take 10 mg by mouth 2 (two) times daily.      labetalol 200 MG tablet  Commonly known as: NORMODYNE   Take 1 tablet (200 mg total) by mouth 2 (two) times daily.      lisinopril-hydrochlorothiazide 20-12.5 MG per tablet   Commonly known as: PRINZIDE,ZESTORETIC   Take 1 tablet by mouth daily.      metFORMIN 1000 MG tablet   Commonly known as: GLUCOPHAGE   Take 1,000 mg by mouth 2 (two) times daily with a meal.      pantoprazole 40 MG tablet   Commonly known as: PROTONIX   Take 1 tablet (40 mg total) by mouth daily.      PARoxetine 20 MG tablet   Commonly known as: PAXIL   Take 20 mg by mouth every morning.      pravastatin 40 MG tablet   Commonly known as: PRAVACHOL   Take 40 mg by mouth every evening.      sennosides-docusate sodium 8.6-50 MG tablet   Commonly known as: SENOKOT-S   Take 2 tablets by mouth daily.      terazosin 5 MG capsule   Commonly known as: HYTRIN   Take 5 mg by mouth daily.            Follow-up Information    Follow up with Lajean Saver, MD. On 07/09/2012. (Appt 11:15am arrive at 10:45am)    Contact information:   [address] [place] [address] [phone]       Follow up with Carollee Herter, MD.   Contact information:   [address] [place] [address] [phone]       Follow up with  Theda Belfast, MD. Schedule an appointment as soon as possible for a visit in 10 days.   Contact information:   [address] [place] [address] [phone]       Follow up with MCALHANY,CHRISTOPHER, MD. Schedule an appointment as soon as possible for a visit in 3 weeks.   Contact information:   [facility] [address] [place] [address] [phone]           The results of significant diagnostics from this hospitalization (including imaging, microbiology, ancillary and laboratory) are listed below for reference.    Significant Diagnostic Studies: Dg Chest 1 View  06/18/2012  *RADIOLOGY REPORT*  Clinical Data: Larey Seat in bathroom, shortness of breath when recumbent for pelvic radiograph, history hypertension, smoking, diabetes  CHEST - 1 VIEW  Comparison: 08/01/2009  Findings: Enlargement of cardiac silhouette with pulmonary vascular congestion. Decreased lung volumes with mild bibasilar atelectasis. Atherosclerotic calcification aorta. No definite infiltrate, pleural effusion, or pneumothorax. No fractures identified.  IMPRESSION: Enlargement of cardiac silhouette. Bibasilar atelectasis.   Original Report Authenticated By: Lollie Marrow, M.D.    Dg Pelvis 1-2 Views  06/18/2012  *RADIOLOGY REPORT*  Clinical Data: Larey Seat in bathroom, generalized pain and stiffness in both hips  PELVIS - 1-2 VIEW  Comparison: None  Findings: Bones appear mildly demineralized. Mild bilateral narrowing of the hip joints. Symmetric SI joints. No acute fracture, dislocation, or bone destruction. Small nonspecific focus of sclerosis at the proximal right femoral diaphysis. Scattered atherosclerotic calcifications.  IMPRESSION: No definite acute bony abnormalities.   Original Report Authenticated By: Lollie Marrow, M.D.    Ct Head Wo Contrast  06/23/2012  *RADIOLOGY REPORT*  Clinical Data: Altered mental status.  Weakness  CT HEAD WITHOUT CONTRAST  Technique:   Contiguous axial images were obtained from the base of the skull through the vertex without contrast.  Comparison: CT 06/18/2012  Findings:  Moderate atrophy, unchanged.  Negative for acute infarct. Negative for hemorrhage or mass.  No skull fracture.  IMPRESSION: No acute abnormality and no interval change.   Original Report Authenticated By: Camelia Phenes, M.D.    Ct Head Wo Contrast  06/18/2012  *RADIOLOGY REPORT*  Clinical Data:  78 year old male status post fall.  CT HEAD WITHOUT CONTRAST CT CERVICAL SPINE WITHOUT CONTRAST  Technique:  Multidetector CT imaging of the head and cervical spine was performed following the standard protocol without intravenous contrast.  Multiplanar CT image reconstructions of the cervical spine were also generated.  Comparison:   None  CT HEAD  Findings: No definite acute scalp soft tissue injury.  No acute orbit soft tissue findings.  Mild paranasal sinus mucosal thickening.  Mastoids are clear.  Calvarium intact.  Scattered dural calcifications. Calcified atherosclerosis at the skull base.  Cerebral volume is within normal limits for age.  No midline shift, ventriculomegaly, mass effect, evidence of mass lesion, intracranial hemorrhage or evidence of cortically based acute infarction.  Gray-white matter differentiation is within normal limits throughout the brain.  IMPRESSION: 1.  Negative for age noncontrast CT appearance of the brain. 2.  See cervical spine findings below.  CT CERVICAL SPINE  Findings: Straightening of cervical lordosis. Visualized skull base is intact.  No atlanto-occipital dissociation.  Cervicothoracic junction alignment is within normal limits.  Lower cervical disc space narrowing.  Occasional small lucent lesions in the cervical spine, but no year most appear related to nutrient foramen.  No destructive or suspicious osseous lesion identified.  No acute cervical fracture identified.  There is a trace retropharyngeal effusion.  Partially retropharyngeal  course of the left carotid artery.  Carotid calcified atherosclerosis.  Other paraspinal soft tissues are within normal limits.  IMPRESSION: 1. No acute fracture or listhesis identified in the cervical spine. Ligamentous injury is not excluded. 2.  Trace retropharyngeal effusion.  This is nonspecific but sometimes is a sign of cervical anterior ligamentous injury. Cervical MRI would confirm if the patient's cervical spine cannot be cleared clinically.   Original Report Authenticated By: Harley Hallmark, M.D.    Ct Cervical Spine Wo Contrast  06/18/2012  *RADIOLOGY REPORT*  Clinical Data:  78 year old male status post fall.  CT HEAD WITHOUT CONTRAST CT CERVICAL SPINE WITHOUT CONTRAST  Technique:  Multidetector CT imaging of the head and cervical spine was performed following the standard protocol without intravenous contrast.  Multiplanar CT image reconstructions of the cervical spine were also generated.  Comparison:   None  CT HEAD  Findings: No definite acute scalp soft tissue injury.  No acute orbit soft tissue findings.  Mild paranasal sinus mucosal thickening.  Mastoids are clear.  Calvarium intact.  Scattered dural calcifications. Calcified atherosclerosis at the skull base.  Cerebral volume is within normal limits for age.  No midline shift, ventriculomegaly, mass effect, evidence of mass lesion, intracranial hemorrhage or evidence of cortically based acute infarction.  Gray-white matter differentiation is within normal limits throughout the brain.  IMPRESSION: 1.  Negative for age noncontrast CT appearance of the brain. 2.  See cervical spine findings below.  CT CERVICAL SPINE  Findings: Straightening of cervical lordosis. Visualized skull base is intact.  No atlanto-occipital dissociation.  Cervicothoracic junction alignment is within normal limits.  Lower cervical disc space narrowing.  Occasional small lucent lesions in the cervical spine, but no year most appear related to nutrient foramen.  No  destructive or suspicious osseous lesion identified.  No acute cervical fracture identified.  There is a trace retropharyngeal effusion.  Partially retropharyngeal course of the left carotid artery.  Carotid calcified atherosclerosis.  Other paraspinal soft tissues are within normal limits.  IMPRESSION: 1. No acute fracture or listhesis identified in the cervical spine. Ligamentous injury is not excluded. 2.  Trace retropharyngeal effusion.  This is nonspecific but sometimes is a sign of cervical anterior ligamentous injury. Cervical MRI would confirm if the patient's cervical spine cannot be cleared clinically.   Original Report Authenticated By: Harley Hallmark, M.D.    Dg Chest Port 1 View  06/20/2012  *RADIOLOGY REPORT*  Clinical Data: Monitor for pulmonary edema.  PORTABLE CHEST - 1 VIEW  Comparison: 06/19/2012  Findings: Shallow inspiration.  Cardiac enlargement with mild pulmonary vascular congestion.  No significant infiltration to suggest edema.  Obscuration of the left hemidiaphragm likely due to pleural effusion with basilar infiltration or atelectasis.  No pneumothorax.  Stable appearance since previous study.  IMPRESSION: Cardiac enlargement with mild pulmonary vascular congestion.  No edema.  Probable small left pleural effusion with basilar atelectasis or infiltration.   Original Report Authenticated By: Marlon Pel, M.D.    Dg Chest Port 1 View  06/19/2012  *RADIOLOGY REPORT*  Clinical Data: Shortness of breath.  Question pulmonary edema. Status post blood transfusion.  PORTABLE CHEST - 1 VIEW  Comparison: Portable chest 06/18/2012.  Findings: The patient has a new NG tube in place.  The tube courses into the stomach and below the inferior margin of film.  There is cardiomegaly but no pulmonary edema.  Left basilar atelectasis persist. No pneumothorax is identified.  IMPRESSION: Cardiomegaly without edema.  Left basilar atelectasis.   Original Report Authenticated By: Bernadene Bell.  Maricela Curet, M.D.    Dg Abd Portable 1v  06/23/2012  *RADIOLOGY REPORT*  Clinical Data: Nausea and vomiting.  Abdominal distention.  PORTABLE ABDOMEN - 1 VIEW  Comparison: None.  Findings: Mild gaseous distention is noted in the colon.  There is no evidence for obstruction or free air.  Degenerative changes are noted in the lower lumbar spine.  IMPRESSION:  1.  Mild gaseous distention of the colon without evidence for obstruction or free air. 2.  Degenerative changes in the lower lumbar spine.   Original Report Authenticated By: Jamesetta Orleans. MATTERN, M.D.     Microbiology: Recent Results (from the past 240 hour(s))  MRSA PCR SCREENING     Status: Normal   Collection Time   06/18/12  7:44 PM      Component Value Range Status Comment   MRSA by PCR NEGATIVE  NEGATIVE Final   CULTURE, BLOOD (ROUTINE X 2)     Status: Normal   Collection Time   06/18/12  9:36 PM      Component Value Range Status Comment   Specimen Description BLOOD RIGHT HAND   Final    Special Requests BOTTLES DRAWN AEROBIC ONLY 8CC   Final    Culture  Setup Time 06/19/2012 01:52   Final    Culture NO GROWTH 5 DAYS   Final    Report Status 06/25/2012 FINAL   Final   CULTURE, BLOOD (ROUTINE X 2)     Status: Normal   Collection Time   06/18/12  9:36 PM      Component Value Range Status Comment   Specimen Description BLOOD LEFT HAND   Final    Special Requests BOTTLES DRAWN AEROBIC ONLY 4CC   Final    Culture  Setup Time 06/19/2012 01:52   Final  Culture NO GROWTH 5 DAYS   Final    Report Status 06/25/2012 FINAL   Final      Labs: Basic Metabolic Panel:  Lab 06/26/12 2725 06/25/12 1206 06/25/12 0505 06/24/12 0508 06/23/12 0630  NA 137 137 137 136 140  K 3.9 4.3 4.1 3.9 3.9  CL 102 102 102 101 104  CO2 26 27 26 26 24   GLUCOSE 135* 174* 139* 145* 174*  BUN 14 17 16 13 13   CREATININE 1.48* 1.51* 1.51* 1.40* 1.30  CALCIUM 8.5 8.5 8.3* 8.4 8.7  MG -- -- -- -- --  PHOS 2.7 -- -- -- --   Liver Function  Tests:  Lab 06/26/12 0525 06/25/12 0505 06/24/12 0508 06/23/12 0630 06/22/12 1000  AST 11 12 17 28  57*  ALT 110* 155* 210* 344* 486*  ALKPHOS 65 63 60 70 67  BILITOT 0.8 1.0 1.4* 1.4* 1.2  PROT 6.1 5.8* 5.7* 6.3 6.1  ALBUMIN 2.7* 2.6* 2.7* 3.1* 3.1*   No results found for this basename: LIPASE:5,AMYLASE:5 in the last 168 hours No results found for this basename: AMMONIA:5 in the last 168 hours CBC:  Lab 06/26/12 0525 06/25/12 1206 06/25/12 0505 06/24/12 0508 06/23/12 0630 06/21/12 0815  WBC 10.7* -- 12.8* 13.6* 13.4* 6.1  NEUTROABS -- -- 8.8* -- -- --  HGB 7.2* 7.1* 7.0* 7.5* 8.0* --  HCT 23.3* -- 23.2* 23.7* 25.9* 24.1*  MCV 76.6* -- 77.1* 77.7* 77.8* 77.5*  PLT 466* -- 387 312 218 101*   Cardiac Enzymes: No results found for this basename: CKTOTAL:5,CKMB:5,CKMBINDEX:5,TROPONINI:5 in the last 168 hours BNP: BNP (last 3 results) No results found for this basename: PROBNP:3 in the last 8760 hours CBG:  Lab 06/26/12 1139 06/26/12 0731 06/25/12 2132 06/25/12 1630 06/25/12 1119  GLUCAP 205* 125* 159* 148* 196*       Signed:  Marylu Dudenhoeffer, JAI-GURMUKH  [facility] 06/26/2012, 1:51 PM

## 2012-06-26 NOTE — Progress Notes (Signed)
PT Cancellation Note  Patient Details Name: [name] MRN: [id number] DOB: 04/09/1934   Cancelled Treatment:    Reason Eval/Treat Not Completed: Medical issues which prohibited therapy. Pt currently receiving first unit of blood and then is discharging to SNF. Will attempt treatment Monday if pt is still in acute care.    Milana Kidney 06/26/2012, 3:37 PM

## 2012-06-27 LAB — TYPE AND SCREEN: ABO/RH(D): O POS

## 2012-07-09 ENCOUNTER — Encounter (INDEPENDENT_AMBULATORY_CARE_PROVIDER_SITE_OTHER): Payer: Self-pay | Admitting: General Surgery

## 2012-07-09 ENCOUNTER — Ambulatory Visit (INDEPENDENT_AMBULATORY_CARE_PROVIDER_SITE_OTHER): Payer: Medicare Other | Admitting: General Surgery

## 2012-07-09 VITALS — BP 126/80 | HR 76 | Temp 98.2°F | Resp 16

## 2012-07-09 DIAGNOSIS — K635 Polyp of colon: Secondary | ICD-10-CM

## 2012-07-09 DIAGNOSIS — D126 Benign neoplasm of colon, unspecified: Secondary | ICD-10-CM

## 2012-07-09 NOTE — Progress Notes (Signed)
Patient ID: [name], male   DOB: 1933/12/15, 78 y.o.   MRN: [id number] The patient is a 78 year old male who was recently seen in the hospital for an upper GI bleed. Patient underwent upper endoscopy as well as lower endoscopy. Patient did not have any noticeable reasons for his upper GI bleed. Patient had a colonoscopy which revealed tubovillous adenomas as well as multiple polyps were biopsied. These polyps were negative for high-grade dysplasia and malignancy.    The patient states he's had no further bleeding episodes at this time.  Assessment and plan: the patient is a 78 year old male with colonic polyps. Recommend patient follow up with the GI doctor and possibly 2 years for followup colonoscopy. At this time there is no need for aggressive surgical intervention since there is no high-grade dysplasia or malignancy.  Should the cecal mass not be able to be endoscopically removed patient to be a candidate for laparoscopic monitored colonoscopic polypectomy.

## 2012-07-29 ENCOUNTER — Encounter: Payer: Self-pay | Admitting: Physician Assistant

## 2012-07-29 ENCOUNTER — Telehealth: Payer: Self-pay | Admitting: *Deleted

## 2012-07-29 ENCOUNTER — Ambulatory Visit (INDEPENDENT_AMBULATORY_CARE_PROVIDER_SITE_OTHER): Payer: Medicare Other | Admitting: Physician Assistant

## 2012-07-29 VITALS — BP 150/72 | HR 88 | Ht 71.0 in | Wt 218.0 lb

## 2012-07-29 DIAGNOSIS — I5022 Chronic systolic (congestive) heart failure: Secondary | ICD-10-CM

## 2012-07-29 DIAGNOSIS — Z8719 Personal history of other diseases of the digestive system: Secondary | ICD-10-CM

## 2012-07-29 DIAGNOSIS — I10 Essential (primary) hypertension: Secondary | ICD-10-CM

## 2012-07-29 DIAGNOSIS — N189 Chronic kidney disease, unspecified: Secondary | ICD-10-CM

## 2012-07-29 DIAGNOSIS — I429 Cardiomyopathy, unspecified: Secondary | ICD-10-CM

## 2012-07-29 DIAGNOSIS — I25810 Atherosclerosis of coronary artery bypass graft(s) without angina pectoris: Secondary | ICD-10-CM

## 2012-07-29 LAB — BASIC METABOLIC PANEL
BUN: 25 mg/dL — ABNORMAL HIGH (ref 6–23)
CO2: 30 mEq/L (ref 19–32)
Calcium: 8.9 mg/dL (ref 8.4–10.5)
Creatinine, Ser: 1.6 mg/dL — ABNORMAL HIGH (ref 0.4–1.5)
Glucose, Bld: 146 mg/dL — ABNORMAL HIGH (ref 70–99)
Sodium: 137 mEq/L (ref 135–145)

## 2012-07-29 MED ORDER — LABETALOL HCL 200 MG PO TABS: 400.0000 mg | ORAL_TABLET | Freq: Two times a day (BID) | ORAL | Status: DC

## 2012-07-29 NOTE — Progress Notes (Signed)
[address] [place], [place]  [address] Phone: [phone], Fax:  [phone]  Date:  07/29/2012   Name:  [name]   DOB:  01-30-1934   MRN:  [id number]  PCP:  Carollee Herter, MD  Primary Cardiologist:  Dr. Verne Carrow  Primary Electrophysiologist:  None    History of Present Illness: [name] is a 78 y.o. male who returns for follow up after recent hospital admission for acute blood loss anemia complicated by NSTEMI.  He has a history of DM2, HTN, HL, CKD. He was admitted 10/24-11/1. He presented after syncopal episode and was noted to be profoundly anemic. Hemoglobin was 3.6. Troponin was abnormal and he was seen by cardiology. Echo 06/19/12: EF 40-45%, mid to distal anteroseptal HK, moderate LAE, PASP 37. There was a question of atrial fibrillation. However, our team felt that his ECG demonstrated sinus rhythm with PACs and occasional nonconducted PACs. Non-STEMI was felt to likely be from demand ischemia in the setting of profound anemia. Given wall motion abnormalities on his echocardiogram underlying CAD cannot be excluded. Patient was felt to be a poor candidate for cardiac intervention with chronic kidney disease and anemia. Medical therapy was recommended. He would be unable to undergo coronary intervention as he would not be a candidate for anticoagulation or antiplatelet therapy with his GI bleeding. EGD ultimately demonstrated no obvious source of bleeding. He did have a large polyp on colonoscopy (biopsy benign). Surgical resection was not performed and the hospital.   He is now staying at [facility]. He should be discharged in the next couple of days. He is somewhat of a poor historian. He does note dyspnea with exertion. He seems to describe class III symptoms. He denies orthopnea or true PND. LE edema is much improved. He denies chest pain or syncope. Weights have continued to decrease.  Labs (10/13):    K 4.3,  creatinine 1.51, Hgb 7 Labs (11/13):    K 3.8, creatinine 1.9 Labs (12/13):    Hgb 8.7, platelets 114,000  Wt Readings from Last 3 Encounters:  07/29/12 218 lb (98.884 kg)  06/22/12 262 lb 12.6 oz (119.2 kg)  06/22/12 262 lb 12.6 oz (119.2 kg)     Past Medical History  Diagnosis Date  . Smoker   . Narcolepsy   . Hypertension   . BPH (benign prostatic hyperplasia)   . Arthritis   . Diabetes mellitus   . Neuropathy in diabetes   . Herpes   . Dyslipidemia   . Obesity   . Lymphedema     LE lymphedemia with hx of cellulitis.  . CKD (chronic kidney disease)   . Anemia   . History of GI bleed 06/2012    EGD unremarkable;  Colo with large cecal polyp (bx benign)  . Ischemic cardiomyopathy     Echo 06/19/12: EF 40-45%, mid to distal anteroseptal HK, moderate LAE, PASP 37  . Chronic systolic heart failure   . CAD (coronary artery disease)     a. probable underlying CAD;  b. Type 2 NSTEMi in setting of profound anemia (Hgb 3.6) from GI bleed in 06/2012 and WMA on echo;  c. med Rx due to CKD and GI bleed    Current Outpatient Prescriptions  Medication Sig Dispense Refill  . acetaminophen (TYLENOL) 325 MG tablet Take 650 mg by mouth every 6 (six) hours as needed. For pain      . bisacodyl (DULCOLAX) 5 MG EC tablet Take 5 mg by  mouth daily as needed. For constipation      . famotidine (PEPCID) 20 MG tablet Take 20 mg by mouth 2 (two) times daily.      . ferrous sulfate 325 (65 FE) MG tablet Take 325 mg by mouth daily with breakfast.      . furosemide (LASIX) 40 MG tablet Take 1 tablet (40 mg total) by mouth 2 (two) times daily.  30 tablet    . labetalol (NORMODYNE) 200 MG tablet Take 1 tablet (200 mg total) by mouth 2 (two) times daily.      Marland Kitchen lisinopril-hydrochlorothiazide (PRINZIDE,ZESTORETIC) 20-12.5 MG per tablet Take 1 tablet by mouth daily.      . metFORMIN (GLUCOPHAGE) 1000 MG tablet Take 1,000 mg by mouth 2 (two) times daily with a meal.      . PARoxetine (PAXIL) 20 MG tablet  Take 20 mg by mouth every morning.      . pravastatin (PRAVACHOL) 40 MG tablet Take 40 mg by mouth every evening.      . promethazine (PHENERGAN) 25 MG tablet Take 25 mg by mouth every 6 (six) hours as needed.      . sennosides-docusate sodium (SENOKOT-S) 8.6-50 MG tablet Take 2 tablets by mouth daily.        Marland Kitchen terazosin (HYTRIN) 5 MG capsule Take 5 mg by mouth daily.      Marland Kitchen glipiZIDE (GLUCOTROL) 10 MG tablet Take 10 mg by mouth 2 (two) times daily.      . pantoprazole (PROTONIX) 40 MG tablet Take 1 tablet (40 mg total) by mouth daily.  60 tablet  0    Allergies:   No Known Allergies  Social History:  The patient  reports that he quit smoking about 13 years ago. He has never used smokeless tobacco. He reports that he does not drink alcohol or use illicit drugs.   ROS:  Please see the history of present illness.   No melena or hematochezia.   All other systems reviewed and negative.   PHYSICAL EXAM: VS:  BP 150/72  Pulse 88  Ht 5\' 11"  (1.803 m)  Wt 218 lb (98.884 kg)  BMI 30.40 kg/m2 Chronically ill-appearing male  in no acute distress HEENT: normal Neck: no JVD at 90 Cardiac:  normal S1, S2; RRR; no murmur Lungs:  clear to auscultation bilaterally, no wheezing, rhonchi or rales Abd: soft, nontender, no hepatomegaly Ext: no edema Skin: warm and dry Neuro:  CNs 2-12 intact, no focal abnormalities noted  EKG:  NSR, HR 88, PACs, no change from prior tracing, QTc 486      ASSESSMENT AND PLAN:  1. Coronary Artery Disease:   This is a presumed diagnosis. The patient had a probable Type 2 non-STEMI in the setting of profound anemia.  Med Rx has been recommended given his CKD and significant bleed.  Patient appears to be stable.  He does have DOE that is likely multifactorial.  Continue current Rx.  Would like to eventually have him on an ASA.  Will defer this until he is seen back by GI.  Start ASA if ok with GI.  2. Hypertension:  Uncontrolled.  Increase Labetalol to 400 mg BID.  3.  Chronic Systolic CHF:  Probable ischemic cardiomyopathy given WMA on echo.  Continue ACE and beta blocker.  Consider hydralazine and nitrates over time.  Check BMET today.  4. Chronic Kidney Disease:  Check BMET today.  5. GI Bleed:  Will make sure he has follow up with GI.  As noted, start ASA for CAD if ok with GI after he is seen.  Of note, surgery has seen back and do not plan for surgical resection of his polyp.    6. Disposition:   Follow up with Dr. Verne Carrow in 4 weeks.   Signed, Tereso Newcomer, PA-C  11:12 AM 07/29/2012

## 2012-07-29 NOTE — Patient Instructions (Addendum)
YOU HAVE A FOLLOW UP WITH DR. [facility] FOR GI BLEED 08/05/12 @ 2:15 PLEASE ARRIVE 15 MINUTES EARLY FOR REGISTRATION  YOU HAVE A FOLLOW UP APPOINTMENT WITH DR. Clifton James 08/31/12 @ 2 PM  LAB TODAY BMET  INCREASE LABETALOL 400 MG TWICE DAILY

## 2012-07-29 NOTE — Telephone Encounter (Signed)
Message copied by Tarri Fuller on Wed Jul 29, 2012  5:46 PM ------      Message from: [place], [place] T      Created: Wed Jul 29, 2012  5:31 PM       Renal fxn stable.      Continue with current treatment plan.      Tereso Newcomer, PA-C  5:31 PM 07/29/2012

## 2012-07-30 ENCOUNTER — Telehealth: Payer: Self-pay | Admitting: *Deleted

## 2012-07-30 NOTE — Telephone Encounter (Signed)
Message copied by Tarri Fuller on Thu Jul 30, 2012 11:12 AM ------      Message from: [place], [place] T      Created: Wed Jul 29, 2012  5:31 PM       Renal fxn stable.      Continue with current treatment plan.      Tereso Newcomer, PA-C  5:31 PM 07/29/2012

## 2012-07-30 NOTE — Telephone Encounter (Signed)
lmom for [facility] renal function stable no changes to be made at this time

## 2012-08-13 ENCOUNTER — Telehealth: Payer: Self-pay | Admitting: Family Medicine

## 2012-08-13 NOTE — Telephone Encounter (Signed)
LM

## 2012-08-31 ENCOUNTER — Ambulatory Visit: Payer: Medicare Other | Admitting: Cardiovascular Disease

## 2012-09-01 ENCOUNTER — Ambulatory Visit (INDEPENDENT_AMBULATORY_CARE_PROVIDER_SITE_OTHER): Payer: Medicare Other | Admitting: Cardiovascular Disease

## 2012-09-01 ENCOUNTER — Encounter: Payer: Self-pay | Admitting: Cardiovascular Disease

## 2012-09-01 VITALS — BP 138/62 | Ht 71.0 in | Wt 221.0 lb

## 2012-09-01 DIAGNOSIS — I10 Essential (primary) hypertension: Secondary | ICD-10-CM

## 2012-09-01 DIAGNOSIS — I255 Ischemic cardiomyopathy: Secondary | ICD-10-CM

## 2012-09-01 DIAGNOSIS — I2510 Atherosclerotic heart disease of native coronary artery without angina pectoris: Secondary | ICD-10-CM

## 2012-09-01 DIAGNOSIS — I2589 Other forms of chronic ischemic heart disease: Secondary | ICD-10-CM

## 2012-09-01 NOTE — Progress Notes (Signed)
History of Present Illness: 79 y.o. male with history of DM, HTN, HLD, CKD who returns for cardiac follow up.  He was admitted 10/24-11/1/13 to [facility]. He presented after syncopal episode and was noted to be profoundly anemic. Hemoglobin was 3.6. Troponin was abnormal and I saw him in consultation. Echo 06/19/12: EF 40-45%, mid to distal anteroseptal HK, moderate LAE, PASP 37. There was a question of atrial fibrillation. However, our team felt that his ECG demonstrated sinus rhythm with PACs and occasional nonconducted PACs. Non-STEMI was felt to likely be from demand ischemia in the setting of profound anemia. Given wall motion abnormalities on his echocardiogram underlying CAD could not be excluded. Patient was felt to be a poor candidate for cardiac intervention with chronic kidney disease and anemia. Medical therapy was recommended. He would be unable to undergo coronary intervention as he would not be a candidate for anticoagulation or antiplatelet therapy with his GI bleeding. EGD ultimately demonstrated no obvious source of bleeding. He did have a large polyp on colonoscopy but biopsy was benign so no surgery planned. He did follow up with Dr. Derrell Lolling with General Surgery on 07/09/12.  He was seen in our office 07/29/12 by Tereso Newcomer, PA-C for hospital f/u.   He is here today for follow up.He is living in [place] on [address].  He is somewhat of a poor historian. He denies chest pain and SOB. He denies orthopnea or true PND. No dark stools. Feeling well.    Primary Care Physician: Sharlot Gowda  Last Lipid Profile:Lipid Panel     Component Value Date/Time   CHOL 134 10/01/2011 1148   TRIG 129 10/01/2011 1148   HDL 30* 10/01/2011 1148   CHOLHDL 4.5 10/01/2011 1148   VLDL 26 10/01/2011 1148   LDLCALC 78 10/01/2011 1148     Past Medical History  Diagnosis Date  . Smoker   . Narcolepsy   . Hypertension   . BPH (benign prostatic hyperplasia)   . Arthritis   . Diabetes  mellitus   . Neuropathy in diabetes   . Herpes   . Dyslipidemia   . Obesity   . Lymphedema     LE lymphedemia with hx of cellulitis.  . CKD (chronic kidney disease)   . Anemia   . History of GI bleed 06/2012    EGD unremarkable;  Colo with large cecal polyp (bx benign)  . Ischemic cardiomyopathy     Echo 06/19/12: EF 40-45%, mid to distal anteroseptal HK, moderate LAE, PASP 37  . Chronic systolic heart failure   . CAD (coronary artery disease)     a. probable underlying CAD;  b. Type 2 NSTEMi in setting of profound anemia (Hgb 3.6) from GI bleed in 06/2012 and WMA on echo;  c. med Rx due to CKD and GI bleed    Past Surgical History  Procedure Date  . Leg surgery     Left leg surgery. broken leg  . Esophagogastroduodenoscopy 06/19/2012    Procedure: ESOPHAGOGASTRODUODENOSCOPY (EGD);  Surgeon: Theda Belfast, MD;  Location: [facility] ENDOSCOPY;  Service: Endoscopy;  Laterality: N/A;  . Colonoscopy 06/21/2012    Procedure: COLONOSCOPY;  Surgeon: Charna Elizabeth, MD;  Location: [facility] ENDOSCOPY;  Service: Endoscopy;  Laterality: N/A;    Current Outpatient Prescriptions  Medication Sig Dispense Refill  . bisacodyl (DULCOLAX) 5 MG EC tablet Take 5 mg by mouth daily as needed. For constipation      . famotidine (PEPCID) 20 MG tablet  Take 20 mg by mouth 2 (two) times daily.      . furosemide (LASIX) 40 MG tablet Take 1 tablet (40 mg total) by mouth 2 (two) times daily.  30 tablet    . labetalol (NORMODYNE) 200 MG tablet Take 2 tablets (400 mg total) by mouth 2 (two) times daily.      Marland Kitchen lisinopril-hydrochlorothiazide (PRINZIDE,ZESTORETIC) 20-12.5 MG per tablet Take 1 tablet by mouth daily.      . metFORMIN (GLUCOPHAGE) 1000 MG tablet Take 1,000 mg by mouth 2 (two) times daily with a meal.      . PARoxetine (PAXIL) 20 MG tablet Take 20 mg by mouth every morning.      . pravastatin (PRAVACHOL) 40 MG tablet Take 40 mg by mouth every evening.      . terazosin (HYTRIN) 5 MG capsule Take 5 mg by mouth daily.         No Known Allergies  History   Social History  . Marital Status: Widowed    Spouse Name: N/A    Number of Children: N/A  . Years of Education: N/A   Occupational History  . Not on file.   Social History Main Topics  . Smoking status: Former Smoker    Quit date: 08/26/1998  . Smokeless tobacco: Never Used  . Alcohol Use: No  . Drug Use: No  . Sexually Active: Not on file   Other Topics Concern  . Not on file   Social History Narrative  . No narrative on file    No family history on file.  Review of Systems:  As stated in the HPI and otherwise negative.   BP 138/62  Ht 5\' 11"  (1.803 m)  Wt 221 lb (100.245 kg)  BMI 30.82 kg/m2  Physical Examination: General: Well developed, well nourished, NAD HEENT: OP clear, mucus membranes moist SKIN: warm, dry. No rashes. Neuro: No focal deficits Musculoskeletal: Muscle strength 5/5 all ext Psychiatric: Mood and affect normal Neck: No JVD, no carotid bruits, no thyromegaly, no lymphadenopathy. Lungs:Clear bilaterally, no wheezes, rhonci, crackles Cardiovascular: Regular rate and rhythm. No murmurs, gallops or rubs. Abdomen:Soft. Bowel sounds present. Non-tender.  Extremities: No lower extremity edema. Pulses are 2 + in the bilateral DP/PT.  Assessment and Plan:   1. Coronary Artery Disease: This is a presumed diagnosis. The patient had a probable Type 2 non-STEMI in the setting of profound anemia. Med Rx has been recommended given his CKD and significant GI bleed. Stable. Continue conservative treatment of probable CAD since he is asymptomatic. Continue current Rx. Would like to eventually have him on an ASA. Will defer this until he is seen back by GI. Start ASA if ok with GI.   2. Hypertension:  Controlled. No changes.    3. Chronic Systolic CHF: Probable ischemic cardiomyopathy given WMA on echo. Continue ACE and beta blocker.   4. Chronic Kidney Disease: BMET stable 07/29/12.  5. GI Bleed: Will need to make  sure he has follow up with GI, Dr. Elnoria Howard. His caregiver from the facility will call and arrange. As noted, start ASA for CAD if ok with GI after he is seen. Of note, surgery has seen back and do not plan for surgical resection of his polyp.

## 2012-09-01 NOTE — Patient Instructions (Addendum)
Your physician wants you to follow-up in:  6 months. You will receive a reminder letter in the mail two months in advance. If you don't receive a letter, please call our office to schedule the follow-up appointment.   

## 2012-09-08 ENCOUNTER — Encounter: Payer: Self-pay | Admitting: Family Medicine

## 2012-09-08 ENCOUNTER — Ambulatory Visit (INDEPENDENT_AMBULATORY_CARE_PROVIDER_SITE_OTHER): Payer: Medicare Other | Admitting: Family Medicine

## 2012-09-08 VITALS — BP 116/80 | HR 74 | Wt 218.0 lb

## 2012-09-08 DIAGNOSIS — I10 Essential (primary) hypertension: Secondary | ICD-10-CM

## 2012-09-08 DIAGNOSIS — Z79899 Other long term (current) drug therapy: Secondary | ICD-10-CM

## 2012-09-08 DIAGNOSIS — E119 Type 2 diabetes mellitus without complications: Secondary | ICD-10-CM

## 2012-09-08 DIAGNOSIS — E669 Obesity, unspecified: Secondary | ICD-10-CM

## 2012-09-08 DIAGNOSIS — D62 Acute posthemorrhagic anemia: Secondary | ICD-10-CM

## 2012-09-08 DIAGNOSIS — K922 Gastrointestinal hemorrhage, unspecified: Secondary | ICD-10-CM

## 2012-09-08 DIAGNOSIS — E785 Hyperlipidemia, unspecified: Secondary | ICD-10-CM

## 2012-09-08 LAB — CBC WITH DIFFERENTIAL/PLATELET
Basophils Relative: 0 % (ref 0–1)
Eosinophils Absolute: 0 10*3/uL (ref 0.0–0.7)
Hemoglobin: 8.2 g/dL — ABNORMAL LOW (ref 13.0–17.0)
MCH: 25.5 pg — ABNORMAL LOW (ref 26.0–34.0)
MCHC: 32.5 g/dL (ref 30.0–36.0)
Monocytes Absolute: 0.4 10*3/uL (ref 0.1–1.0)
Monocytes Relative: 17 % — ABNORMAL HIGH (ref 3–12)
Neutrophils Relative %: 47 % (ref 43–77)
RDW: 20.6 % — ABNORMAL HIGH (ref 11.5–15.5)

## 2012-09-08 NOTE — Progress Notes (Signed)
  Subjective:    Patient ID: [name], male    DOB: 1934/02/22, 79 y.o.   MRN: [id number]  HPI He is here for followup after recent hospitalization. He was found to be anemic and did get a workup for this. Apparently no cause for the bleed was found. He did have some cardiac issues but this was felt to be mostly due to his profoundly low hemoglobin. He has followed up with cardiology concerning this and that note was reviewed. He is now living in an assisted living facility and seems to be thriving there. He is however interested in returning to home. He has had no chest pain, shortness of breath, DOE. They do check his blood sugars regularly and maintain his present medication regimen. The regimen was reviewed. Presently is not taking metformin.  Review of Systems     Objective:   Physical Exam Alert and in no distress. Hemoglobin A1c is 6.4. Exam of his lower extremities shows no edema.    Assessment & Plan:   1. Diabetes mellitus  POCT glycosylated hemoglobin (Hb A1C)  2. Hypertension  CBC with Differential, Comprehensive metabolic panel  3. Hyperlipemia  Lipid panel  4. Obesity  CBC with Differential, Comprehensive metabolic panel  5. GI bleed  CBC with Differential  6. Anemia associated with acute blood loss  CBC with Differential  7. Encounter for long-term (current) use of other medications  CBC with Differential, Comprehensive metabolic panel, Lipid panel   overall he seems to be doing much better. I strongly encouraged him to stay in his present living situation although he states he wants to return home. He states that he is feeling stronger and then proceeded asked for a motorized wheelchair. He then stated he wanted to return to playing golf. Explained that he cannot play golf if he is in a wheelchair. Recheck here in 4 months or sooner if any problems.

## 2012-09-09 ENCOUNTER — Other Ambulatory Visit: Payer: Self-pay | Admitting: Family Medicine

## 2012-09-09 DIAGNOSIS — R899 Unspecified abnormal finding in specimens from other organs, systems and tissues: Secondary | ICD-10-CM

## 2012-09-09 LAB — LIPID PANEL
HDL: 40 mg/dL (ref >39)
LDL Cholesterol: 83 mg/dL (ref 0–99)
Triglycerides: 140 mg/dL (ref <150)
VLDL: 28 mg/dL (ref 0–40)

## 2012-09-09 LAB — COMPREHENSIVE METABOLIC PANEL
Alkaline Phosphatase: 41 U/L (ref 39–117)
BUN: 34 mg/dL — ABNORMAL HIGH (ref 6–23)
Glucose, Bld: 126 mg/dL — ABNORMAL HIGH (ref 70–99)
Sodium: 138 mEq/L (ref 135–145)
Total Bilirubin: 0.5 mg/dL (ref 0.3–1.2)

## 2012-11-12 ENCOUNTER — Telehealth: Payer: Self-pay | Admitting: Internal Medicine

## 2012-11-12 NOTE — Telephone Encounter (Signed)
I was called by Oletta Lamas NP [phone]) regarding Mr. Matich. He has a history of a GI bleed in Oct 2012 and his Hb at that time was 3.6 and also had an NSTEMI in that setting. She calls today stating that his recent blood workup shows his Hb at 7.6 and on discharge last year it was 8.2 and is considering direct admit for blood transfusion. Chart review shows that after that hospitalization his Hb has been stable 7.2-8.2. She denies any evidence of current bleed, no bright blood in his stools, no dark tarry stools either. He is not lightheaded or dizzy and is not orthostatic. He feels well without chest pain or any symptoms.   Patient is symptom free now and his Hb seems to be close to his baseline based on previous recordings. I am not sure he needs direct inpatient admission at this time, but if repeat Hb drops or if he develops some symptoms suggestive of a gi bleed or chest pain he will need admission.

## 2013-01-14 ENCOUNTER — Encounter (HOSPITAL_COMMUNITY): Payer: Self-pay | Admitting: Emergency Medicine

## 2013-01-14 ENCOUNTER — Ambulatory Visit: Payer: Medicare Other | Admitting: Family Medicine

## 2013-01-14 ENCOUNTER — Emergency Department (HOSPITAL_COMMUNITY): Payer: Medicare Other

## 2013-01-14 ENCOUNTER — Emergency Department (HOSPITAL_COMMUNITY)
Admission: EM | Admit: 2013-01-14 | Discharge: 2013-01-14 | Disposition: A | Discharge status: Skilled Nursing Facility | Payer: Medicare Other | Attending: Emergency Medicine | Admitting: Emergency Medicine

## 2013-01-14 DIAGNOSIS — N189 Chronic kidney disease, unspecified: Secondary | ICD-10-CM | POA: Insufficient documentation

## 2013-01-14 DIAGNOSIS — E1149 Type 2 diabetes mellitus with other diabetic neurological complication: Secondary | ICD-10-CM | POA: Insufficient documentation

## 2013-01-14 DIAGNOSIS — Z8619 Personal history of other infectious and parasitic diseases: Secondary | ICD-10-CM | POA: Insufficient documentation

## 2013-01-14 DIAGNOSIS — I2510 Atherosclerotic heart disease of native coronary artery without angina pectoris: Secondary | ICD-10-CM | POA: Insufficient documentation

## 2013-01-14 DIAGNOSIS — Z87448 Personal history of other diseases of urinary system: Secondary | ICD-10-CM | POA: Insufficient documentation

## 2013-01-14 DIAGNOSIS — Z79899 Other long term (current) drug therapy: Secondary | ICD-10-CM | POA: Insufficient documentation

## 2013-01-14 DIAGNOSIS — Z87891 Personal history of nicotine dependence: Secondary | ICD-10-CM | POA: Insufficient documentation

## 2013-01-14 DIAGNOSIS — Z8719 Personal history of other diseases of the digestive system: Secondary | ICD-10-CM | POA: Insufficient documentation

## 2013-01-14 DIAGNOSIS — I5022 Chronic systolic (congestive) heart failure: Secondary | ICD-10-CM | POA: Insufficient documentation

## 2013-01-14 DIAGNOSIS — Z8679 Personal history of other diseases of the circulatory system: Secondary | ICD-10-CM | POA: Insufficient documentation

## 2013-01-14 DIAGNOSIS — E785 Hyperlipidemia, unspecified: Secondary | ICD-10-CM | POA: Insufficient documentation

## 2013-01-14 DIAGNOSIS — D649 Anemia, unspecified: Secondary | ICD-10-CM | POA: Insufficient documentation

## 2013-01-14 DIAGNOSIS — R51 Headache: Secondary | ICD-10-CM | POA: Insufficient documentation

## 2013-01-14 DIAGNOSIS — Z8669 Personal history of other diseases of the nervous system and sense organs: Secondary | ICD-10-CM | POA: Insufficient documentation

## 2013-01-14 DIAGNOSIS — E1142 Type 2 diabetes mellitus with diabetic polyneuropathy: Secondary | ICD-10-CM | POA: Insufficient documentation

## 2013-01-14 DIAGNOSIS — E669 Obesity, unspecified: Secondary | ICD-10-CM | POA: Insufficient documentation

## 2013-01-14 DIAGNOSIS — I129 Hypertensive chronic kidney disease with stage 1 through stage 4 chronic kidney disease, or unspecified chronic kidney disease: Secondary | ICD-10-CM | POA: Insufficient documentation

## 2013-01-14 DIAGNOSIS — M129 Arthropathy, unspecified: Secondary | ICD-10-CM | POA: Insufficient documentation

## 2013-01-14 LAB — CBC
HCT: 26.7 % — ABNORMAL LOW (ref 39.0–52.0)
Hemoglobin: 8.9 g/dL — ABNORMAL LOW (ref 13.0–17.0)
MCH: 28.7 pg (ref 26.0–34.0)
MCHC: 33.3 g/dL (ref 30.0–36.0)
MCV: 86.1 fL (ref 78.0–100.0)
Platelets: 78 10*3/uL — ABNORMAL LOW (ref 150–400)
RBC: 3.1 MIL/uL — ABNORMAL LOW (ref 4.22–5.81)
RDW: 16.3 % — ABNORMAL HIGH (ref 11.5–15.5)
WBC: 3 10*3/uL — ABNORMAL LOW (ref 4.0–10.5)

## 2013-01-14 LAB — URINALYSIS, ROUTINE W REFLEX MICROSCOPIC
Bilirubin Urine: NEGATIVE
Glucose, UA: NEGATIVE mg/dL
Hgb urine dipstick: NEGATIVE
Ketones, ur: NEGATIVE mg/dL
Leukocytes, UA: NEGATIVE
Nitrite: NEGATIVE
Protein, ur: NEGATIVE mg/dL
Specific Gravity, Urine: 1.014 (ref 1.005–1.030)
Urobilinogen, UA: 0.2 mg/dL (ref 0.0–1.0)
pH: 5 (ref 5.0–8.0)

## 2013-01-14 LAB — COMPREHENSIVE METABOLIC PANEL
ALT: 10 U/L (ref 0–53)
AST: 12 U/L (ref 0–37)
CO2: 28 mEq/L (ref 19–32)
Calcium: 9.3 mg/dL (ref 8.4–10.5)
Potassium: 4 mEq/L (ref 3.5–5.1)
Sodium: 136 mEq/L (ref 135–145)
Total Protein: 7 g/dL (ref 6.0–8.3)

## 2013-01-14 LAB — PROTIME-INR
INR: 1.03 (ref 0.00–1.49)
Prothrombin Time: 13.4 seconds (ref 11.6–15.2)

## 2013-01-14 LAB — POCT I-STAT TROPONIN I: Troponin i, poc: 0.01 ng/mL (ref 0.00–0.08)

## 2013-01-14 LAB — DIFFERENTIAL
Basophils Absolute: 0 10*3/uL (ref 0.0–0.1)
Basophils Relative: 0 % (ref 0–1)
Eosinophils Absolute: 0 10*3/uL (ref 0.0–0.7)
Eosinophils Relative: 1 % (ref 0–5)
Lymphocytes Relative: 33 % (ref 12–46)
Lymphs Abs: 1 10*3/uL (ref 0.7–4.0)
Monocytes Absolute: 0.6 10*3/uL (ref 0.1–1.0)
Monocytes Relative: 21 % — ABNORMAL HIGH (ref 3–12)
Neutro Abs: 1.4 10*3/uL — ABNORMAL LOW (ref 1.7–7.7)
Neutrophils Relative %: 45 % (ref 43–77)

## 2013-01-14 LAB — SEDIMENTATION RATE: Sed Rate: 20 mm/hr — ABNORMAL HIGH (ref 0–16)

## 2013-01-14 LAB — TROPONIN I: Troponin I: <0.3 ng/mL (ref <0.30)

## 2013-01-14 LAB — GLUCOSE, CAPILLARY: Glucose-Capillary: 164 mg/dL — ABNORMAL HIGH (ref 70–99)

## 2013-01-14 MED ORDER — ACETAMINOPHEN 325 MG PO TABS
650.0000 mg | ORAL_TABLET | Freq: Once | ORAL | Status: AC
  Administered 2013-01-14: 650 mg via ORAL
  Filled 2013-01-14: qty 2

## 2013-01-14 NOTE — ED Notes (Signed)
[id number]<YQ> Expected date:01/14/13<BR> Expected time:12:16 AM<BR> Means of arrival:Ambulance<BR> Comments:<BR> 79 yo M  Headache

## 2013-01-14 NOTE — ED Notes (Signed)
Brought in by EMS from [facility] with c/o headache.  Per EMS, pt has been having headache that is progressively worsening, pt seeks relief and evaluation.

## 2013-01-14 NOTE — ED Notes (Signed)
Pt is aware of the need for a urine sample. Urinal at bedside.  

## 2013-01-14 NOTE — ED Provider Notes (Signed)
History     CSN: [id number]  Arrival date & time 01/14/13  0040   First MD Initiated Contact with Patient 01/14/13 [phone]      Chief Complaint  Patient presents with  . Headache   HPI  History provided by the patient. Patient is a 79 year old male with history of hypertension, hyperlipidemia, diabetes, CAD, CHF, CKD who presents with complaints of headache. Patient states he has been having episodes of left-sided headache for the past several days. He reports having brief episodes of sharp stabbing pains to the front left side of his head. Seem to come at any time. This evening sometime between 8 and 9 PM while sitting at his computer playing solitaire he began having similar symptoms again. This continued into the evening and he was concerned that pain was more severe than it had been earlier this week and came for further evaluation. Patient resides at nursing home and has not complained or been evaluated for his symptoms this week. He has not used any treatments for his pain or symptoms. Denies having similar headaches previously. He denies having any weakness or numbness in his extremities. Denies any vision change. No confusion. No speech changes. No recent illness. No fever, chills or sweats.    Past Medical History  Diagnosis Date  . Smoker   . Narcolepsy   . Hypertension   . BPH (benign prostatic hyperplasia)   . Arthritis   . Diabetes mellitus   . Neuropathy in diabetes   . Herpes   . Dyslipidemia   . Obesity   . Lymphedema     LE lymphedemia with hx of cellulitis.  . CKD (chronic kidney disease)   . Anemia   . History of GI bleed 06/2012    EGD unremarkable;  Colo with large cecal polyp (bx benign)  . Ischemic cardiomyopathy     Echo 06/19/12: EF 40-45%, mid to distal anteroseptal HK, moderate LAE, PASP 37  . Chronic systolic heart failure   . CAD (coronary artery disease)     a. probable underlying CAD;  b. Type 2 NSTEMi in setting of profound anemia (Hgb 3.6) from GI  bleed in 06/2012 and WMA on echo;  c. med Rx due to CKD and GI bleed    Past Surgical History  Procedure Laterality Date  . Leg surgery      Left leg surgery. broken leg  . Esophagogastroduodenoscopy  06/19/2012    Procedure: ESOPHAGOGASTRODUODENOSCOPY (EGD);  Surgeon: Theda Belfast, MD;  Location: [facility] ENDOSCOPY;  Service: Endoscopy;  Laterality: N/A;  . Colonoscopy  06/21/2012    Procedure: COLONOSCOPY;  Surgeon: Charna Elizabeth, MD;  Location: [facility] ENDOSCOPY;  Service: Endoscopy;  Laterality: N/A;    History reviewed. No pertinent family history.  History  Substance Use Topics  . Smoking status: Former Smoker    Quit date: 08/26/1998  . Smokeless tobacco: Never Used  . Alcohol Use: No      Review of Systems  Constitutional: Negative for fever, chills and diaphoresis.  Gastrointestinal: Negative for nausea and vomiting.  Neurological: Positive for headaches. Negative for dizziness, facial asymmetry, speech difficulty, weakness, light-headedness and numbness.  Psychiatric/Behavioral: Negative for confusion.  All other systems reviewed and are negative.    Allergies  Review of patient's allergies indicates no known allergies.  Home Medications   Current Outpatient Rx  Name  Route  Sig  Dispense  Refill  . bisacodyl (DULCOLAX) 5 MG EC tablet   Oral   Take 5 mg by  mouth daily as needed. For constipation         . famotidine (PEPCID) 20 MG tablet   Oral   Take 20 mg by mouth 2 (two) times daily.         . furosemide (LASIX) 40 MG tablet   Oral   Take 1 tablet (40 mg total) by mouth 2 (two) times daily.   30 tablet      . labetalol (NORMODYNE) 200 MG tablet   Oral   Take 2 tablets (400 mg total) by mouth 2 (two) times daily.         Marland Kitchen lisinopril-hydrochlorothiazide (PRINZIDE,ZESTORETIC) 20-12.5 MG per tablet   Oral   Take 1 tablet by mouth daily.         Marland Kitchen omeprazole (PRILOSEC) 20 MG capsule   Oral   Take 20 mg by mouth daily.         Marland Kitchen PARoxetine  (PAXIL) 20 MG tablet   Oral   Take 20 mg by mouth every morning.         . pravastatin (PRAVACHOL) 40 MG tablet   Oral   Take 40 mg by mouth every evening.         . terazosin (HYTRIN) 5 MG capsule   Oral   Take 5 mg by mouth daily.           BP 134/50  Pulse 85  Temp(Src) 98.2 F (36.8 C) (Oral)  Resp 18  SpO2 100%  Physical Exam  Nursing note and vitals reviewed. Constitutional: He is oriented to person, place, and time. He appears well-developed and well-nourished. No distress.  HENT:  Head: Normocephalic.  Eyes: Conjunctivae and EOM are normal. Pupils are equal, round, and reactive to light.  Cardiovascular: Normal rate and regular rhythm.   Pulmonary/Chest: Effort normal and breath sounds normal. No respiratory distress. He has no wheezes. He has no rales.  Abdominal: Soft.  Musculoskeletal: He exhibits edema.  Bilateral lower extremity swelling up to the mid shins and knee. 2+ pitting edema  Neurological: He is alert and oriented to person, place, and time. No cranial nerve deficit or sensory deficit.  Strength is equal bilaterally. No pronator drift. Slight resting tremor in upper extremities.   Skin: Skin is warm.  Psychiatric: He has a normal mood and affect. His behavior is normal.    ED Course  Procedures   Results for orders placed during the hospital encounter of 01/14/13  PROTIME-INR      Result Value Range   Prothrombin Time 13.4  11.6 - 15.2 seconds   INR 1.03  0.00 - 1.49  APTT      Result Value Range   aPTT 35  24 - 37 seconds  CBC      Result Value Range   WBC 3.0 (*) 4.0 - 10.5 K/uL   RBC 3.10 (*) 4.22 - 5.81 MIL/uL   Hemoglobin 8.9 (*) 13.0 - 17.0 g/dL   HCT 16.1 (*) 09.6 - 04.5 %   MCV 86.1  78.0 - 100.0 fL   MCH 28.7  26.0 - 34.0 pg   MCHC 33.3  30.0 - 36.0 g/dL   RDW 40.9 (*) 81.1 - 91.4 %   Platelets 78 (*) 150 - 400 K/uL  DIFFERENTIAL      Result Value Range   Neutrophils Relative % 45  43 - 77 %   Neutro Abs 1.4 (*) 1.7 -  7.7 K/uL   Lymphocytes Relative 33  12 - 46 %  Lymphs Abs 1.0  0.7 - 4.0 K/uL   Monocytes Relative 21 (*) 3 - 12 %   Monocytes Absolute 0.6  0.1 - 1.0 K/uL   Eosinophils Relative 1  0 - 5 %   Eosinophils Absolute 0.0  0.0 - 0.7 K/uL   Basophils Relative 0  0 - 1 %   Basophils Absolute 0.0  0.0 - 0.1 K/uL  COMPREHENSIVE METABOLIC PANEL      Result Value Range   Sodium 136  135 - 145 mEq/L   Potassium 4.0  3.5 - 5.1 mEq/L   Chloride 98  96 - 112 mEq/L   CO2 28  19 - 32 mEq/L   Glucose, Bld 163 (*) 70 - 99 mg/dL   BUN 47 (*) 6 - 23 mg/dL   Creatinine, Ser 1.61 (*) 0.50 - 1.35 mg/dL   Calcium 9.3  8.4 - 09.6 mg/dL   Total Protein 7.0  6.0 - 8.3 g/dL   Albumin 3.9  3.5 - 5.2 g/dL   AST 12  0 - 37 U/L   ALT 10  0 - 53 U/L   Alkaline Phosphatase 52  39 - 117 U/L   Total Bilirubin 0.2 (*) 0.3 - 1.2 mg/dL   GFR calc non Af Amer 25 (*) >90 mL/min   GFR calc Af Amer 29 (*) >90 mL/min  TROPONIN I      Result Value Range   Troponin I <0.30  <0.30 ng/mL  URINALYSIS, ROUTINE W REFLEX MICROSCOPIC      Result Value Range   Color, Urine YELLOW  YELLOW   APPearance CLEAR  CLEAR   Specific Gravity, Urine 1.014  1.005 - 1.030   pH 5.0  5.0 - 8.0   Glucose, UA NEGATIVE  NEGATIVE mg/dL   Hgb urine dipstick NEGATIVE  NEGATIVE   Bilirubin Urine NEGATIVE  NEGATIVE   Ketones, ur NEGATIVE  NEGATIVE mg/dL   Protein, ur NEGATIVE  NEGATIVE mg/dL   Urobilinogen, UA 0.2  0.0 - 1.0 mg/dL   Nitrite NEGATIVE  NEGATIVE   Leukocytes, UA NEGATIVE  NEGATIVE  GLUCOSE, CAPILLARY      Result Value Range   Glucose-Capillary 164 (*) 70 - 99 mg/dL  SEDIMENTATION RATE      Result Value Range   Sed Rate 20 (*) 0 - 16 mm/hr  POCT I-STAT TROPONIN I      Result Value Range   Troponin i, poc 0.01  0.00 - 0.08 ng/mL   Comment 3                Ct Head Wo Contrast  01/14/2013   *RADIOLOGY REPORT*  Clinical Data: Headache.  CT HEAD WITHOUT CONTRAST  Technique:  Contiguous axial images were obtained from the  base of the skull through the vertex without contrast.  Comparison: 06/23/2012.  Findings: Stable age related cerebral atrophy, ventriculomegaly and periventricular white matter disease.  No extra-axial fluid collections are identified.  No CT findings for hemispheric infarction and/or intracranial hemorrhage.  No mass lesions.  The brainstem and cerebellum grossly normal and stable.  Stable vascular calcifications.  No acute bony findings.  The paranasal sinuses and mastoid air cells are clear except for a mucous retention cyst or polyp in the left frontal sinus.  The globes are intact.  IMPRESSION:  1.  No acute intracranial findings or mass lesions. 2.  Stable age related cerebral atrophy, ventriculomegaly and periventricular white matter disease.   Original Report Authenticated By: Rudie Meyer, M.D.  1. Headache       MDM  12:50 AM patient seen and evaluated. Patient sitting up appears comfortable in no acute distress. He has normal nonfocal neuro exam.   Pt with labs at or near baseline without significant change. CT scan unremarkable without acute findings.  Headache has been intermittent for past week or more.  No sudden "thunder clap" headache.  Doubt SAH.  Unremarkable sed rate and doubt temporal arteritis. Pt also seen and evaluated with Attending Physician.  He agrees with work up and treatment plan.  Will dc back to nursing home at this time.  Pt instructed to follow up with PCP for additional evaluation.     Angus Seller, PA-C 01/14/13 [phone]

## 2013-01-14 NOTE — ED Provider Notes (Signed)
Medical screening examination/treatment/procedure(s) were conducted as a shared visit with non-physician practitioner(s) and myself.  I personally evaluated the patient during the encounter.  Patient presents with complaints of headache. Patient has a normal neurologic exam. Cranial nerves were normal, fine motor function normal. CT head was unremarkable. Temporal arteritis was considered but felt unlikely, sedimentation rate negative. Will refer back to primary care Dr. for further evaluation and management.  Gilda Crease, MD 01/14/13 [phone]

## 2013-02-26 ENCOUNTER — Encounter (HOSPITAL_COMMUNITY): Payer: Self-pay | Admitting: Emergency Medicine

## 2013-02-26 ENCOUNTER — Emergency Department (HOSPITAL_COMMUNITY)
Admission: EM | Admit: 2013-02-26 | Discharge: 2013-02-26 | Disposition: A | Discharge status: Skilled Nursing Facility | Payer: Medicare Other | Attending: Emergency Medicine | Admitting: Emergency Medicine

## 2013-02-26 DIAGNOSIS — I129 Hypertensive chronic kidney disease with stage 1 through stage 4 chronic kidney disease, or unspecified chronic kidney disease: Secondary | ICD-10-CM | POA: Insufficient documentation

## 2013-02-26 DIAGNOSIS — M549 Dorsalgia, unspecified: Secondary | ICD-10-CM

## 2013-02-26 DIAGNOSIS — Z87891 Personal history of nicotine dependence: Secondary | ICD-10-CM | POA: Insufficient documentation

## 2013-02-26 DIAGNOSIS — Z8679 Personal history of other diseases of the circulatory system: Secondary | ICD-10-CM | POA: Insufficient documentation

## 2013-02-26 DIAGNOSIS — E669 Obesity, unspecified: Secondary | ICD-10-CM | POA: Insufficient documentation

## 2013-02-26 DIAGNOSIS — Z8739 Personal history of other diseases of the musculoskeletal system and connective tissue: Secondary | ICD-10-CM | POA: Insufficient documentation

## 2013-02-26 DIAGNOSIS — Z79899 Other long term (current) drug therapy: Secondary | ICD-10-CM | POA: Insufficient documentation

## 2013-02-26 DIAGNOSIS — I2510 Atherosclerotic heart disease of native coronary artery without angina pectoris: Secondary | ICD-10-CM | POA: Insufficient documentation

## 2013-02-26 DIAGNOSIS — D649 Anemia, unspecified: Secondary | ICD-10-CM | POA: Insufficient documentation

## 2013-02-26 DIAGNOSIS — G909 Disorder of the autonomic nervous system, unspecified: Secondary | ICD-10-CM | POA: Insufficient documentation

## 2013-02-26 DIAGNOSIS — M545 Low back pain, unspecified: Secondary | ICD-10-CM | POA: Insufficient documentation

## 2013-02-26 DIAGNOSIS — D72819 Decreased white blood cell count, unspecified: Secondary | ICD-10-CM

## 2013-02-26 DIAGNOSIS — E1149 Type 2 diabetes mellitus with other diabetic neurological complication: Secondary | ICD-10-CM | POA: Insufficient documentation

## 2013-02-26 DIAGNOSIS — I5022 Chronic systolic (congestive) heart failure: Secondary | ICD-10-CM | POA: Insufficient documentation

## 2013-02-26 DIAGNOSIS — N189 Chronic kidney disease, unspecified: Secondary | ICD-10-CM | POA: Insufficient documentation

## 2013-02-26 DIAGNOSIS — E785 Hyperlipidemia, unspecified: Secondary | ICD-10-CM | POA: Insufficient documentation

## 2013-02-26 DIAGNOSIS — Z8619 Personal history of other infectious and parasitic diseases: Secondary | ICD-10-CM | POA: Insufficient documentation

## 2013-02-26 DIAGNOSIS — Z8669 Personal history of other diseases of the nervous system and sense organs: Secondary | ICD-10-CM | POA: Insufficient documentation

## 2013-02-26 LAB — CBC WITH DIFFERENTIAL/PLATELET
Basophils Absolute: 0 10*3/uL (ref 0.0–0.1)
Basophils Relative: 0 % (ref 0–1)
MCHC: 33.9 g/dL (ref 30.0–36.0)
Monocytes Absolute: 0.5 10*3/uL (ref 0.1–1.0)
Neutro Abs: 1.2 10*3/uL — ABNORMAL LOW (ref 1.7–7.7)
Neutrophils Relative %: 49 % (ref 43–77)
Platelets: 259 10*3/uL (ref 150–400)
RDW: 14.1 % (ref 11.5–15.5)

## 2013-02-26 LAB — COMPREHENSIVE METABOLIC PANEL
AST: 11 U/L (ref 0–37)
Albumin: 3.9 g/dL (ref 3.5–5.2)
Chloride: 102 mEq/L (ref 96–112)
Creatinine, Ser: 2.47 mg/dL — ABNORMAL HIGH (ref 0.50–1.35)
Potassium: 4.8 mEq/L (ref 3.5–5.1)
Total Bilirubin: 0.2 mg/dL — ABNORMAL LOW (ref 0.3–1.2)

## 2013-02-26 LAB — URINALYSIS, ROUTINE W REFLEX MICROSCOPIC
Glucose, UA: NEGATIVE mg/dL
Hgb urine dipstick: NEGATIVE
Protein, ur: NEGATIVE mg/dL
Specific Gravity, Urine: 1.014 (ref 1.005–1.030)

## 2013-02-26 MED ORDER — TRAMADOL HCL 50 MG PO TABS
50.0000 mg | ORAL_TABLET | Freq: Once | ORAL | Status: AC
  Administered 2013-02-26: 50 mg via ORAL
  Filled 2013-02-26: qty 1

## 2013-02-26 MED ORDER — TRAMADOL HCL 50 MG PO TABS: 50.0000 mg | ORAL_TABLET | Freq: Four times a day (QID) | ORAL | Status: DC | PRN

## 2013-02-26 MED ORDER — ACETAMINOPHEN 325 MG PO TABS
650.0000 mg | ORAL_TABLET | Freq: Once | ORAL | Status: AC
  Administered 2013-02-26: 650 mg via ORAL
  Filled 2013-02-26: qty 2

## 2013-02-26 NOTE — ED Provider Notes (Signed)
79 year old male comes in with bilateral low back pain radiating to both legs. Pain was present when he woke up this morning. He denies any recent trauma or unusual activity. He denies prior history of back pain. He states that his legs are weak and that he has to use a walker but there is no focal weakness and he denies any bowel or bladder dysfunction. On exam, he is somnolent but arousable and does note that he does have a history of narcolepsy. There is no tenderness in the lumbar spine and negative straight leg raise. He has chronic lymphedema of his legs but there no objective motor or sensory deficits and deep tendon reflexes are symmetric. His old records are reviewed and I do not see any imaging of his lumbar spine at any point. He had a hospitalization last year for profound anemia and he continues to be anemic. Last CBC in May showed pancytopenia. Therefore, CBC and metabolic panel will be repeated today her. Bedside ultrasound will be done to rule out abdominal aneurysm.  I saw and evaluated the patient, reviewed the resident's note and I agree with the findings and plan.   Dione Booze, MD 02/26/13 2352

## 2013-02-26 NOTE — ED Notes (Signed)
Spoke with EDP if patient can have water per pateint's request to provide a urine sample.  EDP ordered patient to have water.

## 2013-02-26 NOTE — ED Notes (Signed)
Onset today woke up with lower back pain radiating to right lower extremity.  Patient intermittently uses a computer next to bed that is same height as bed.  Alert answering and following commands appropriate.

## 2013-02-26 NOTE — ED Notes (Signed)
Secretary called [facility] to transport patient to [facility]. [employer].

## 2013-02-26 NOTE — ED Provider Notes (Signed)
History    CSN: [id number] Arrival date & time 02/26/13  1514  First MD Initiated Contact with Patient 02/26/13 1524     Chief Complaint  Patient presents with  . Back Pain   (Consider location/radiation/quality/duration/timing/severity/associated sxs/prior Treatment) Patient is a 79 y.o. male presenting with back pain.  Back Pain Location:  Lumbar spine Quality:  Shooting Radiates to:  R posterior upper leg Pain severity:  Severe Worse during: worse when bending. Onset quality:  Unable to specify Duration:  12 hours (paitent awoke with this pain) Timing:  Constant Progression:  Waxing and waning Chronicity:  New Context: not emotional stress, not falling, not lifting heavy objects, not MCA, not MVA, not pedestrian accident, not physical stress and not twisting   Relieved by:  OTC medications (some relief with tylenol) Worsened by:  Bending Associated symptoms: no abdominal pain, no abdominal swelling, no bladder incontinence, no bowel incontinence, no chest pain, no dysuria, no fever, no headaches, no numbness, no paresthesias, no perianal numbness, no tingling, no weakness and no weight loss    Past Medical History  Diagnosis Date  . Smoker   . Narcolepsy   . Hypertension   . BPH (benign prostatic hyperplasia)   . Arthritis   . Diabetes mellitus   . Neuropathy in diabetes   . Herpes   . Dyslipidemia   . Obesity   . Lymphedema     LE lymphedemia with hx of cellulitis.  . CKD (chronic kidney disease)   . Anemia   . History of GI bleed 06/2012    EGD unremarkable;  Colo with large cecal polyp (bx benign)  . Ischemic cardiomyopathy     Echo 06/19/12: EF 40-45%, mid to distal anteroseptal HK, moderate LAE, PASP 37  . Chronic systolic heart failure   . CAD (coronary artery disease)     a. probable underlying CAD;  b. Type 2 NSTEMi in setting of profound anemia (Hgb 3.6) from GI bleed in 06/2012 and WMA on echo;  c. med Rx due to CKD and GI bleed   Past Surgical  History  Procedure Laterality Date  . Leg surgery      Left leg surgery. broken leg  . Esophagogastroduodenoscopy  06/19/2012    Procedure: ESOPHAGOGASTRODUODENOSCOPY (EGD);  Surgeon: Theda Belfast, MD;  Location: [facility] ENDOSCOPY;  Service: Endoscopy;  Laterality: N/A;  . Colonoscopy  06/21/2012    Procedure: COLONOSCOPY;  Surgeon: Charna Elizabeth, MD;  Location: [facility] ENDOSCOPY;  Service: Endoscopy;  Laterality: N/A;   No family history on file. History  Substance Use Topics  . Smoking status: Former Smoker    Quit date: 08/26/1998  . Smokeless tobacco: Never Used  . Alcohol Use: No    Review of Systems  Constitutional: Negative for fever, chills, weight loss, diaphoresis, activity change, appetite change and fatigue.  HENT: Negative for congestion, sore throat, rhinorrhea, sneezing and trouble swallowing.   Eyes: Negative for pain and redness.  Respiratory: Negative for cough, choking, chest tightness, shortness of breath, wheezing and stridor.   Cardiovascular: Negative for chest pain and leg swelling.  Gastrointestinal: Negative for nausea, vomiting, abdominal pain, diarrhea, constipation, blood in stool, abdominal distention, anal bleeding and bowel incontinence.  Genitourinary: Negative for bladder incontinence and dysuria.  Musculoskeletal: Positive for back pain. Negative for myalgias.  Skin: Negative for rash.  Neurological: Negative for dizziness, tingling, speech difficulty, weakness, light-headedness, numbness, headaches and paresthesias.  Hematological: Negative for adenopathy.  Psychiatric/Behavioral: Negative for confusion.    Allergies  Review of patient's allergies indicates no known allergies.  Home Medications   Current Outpatient Rx  Name  Route  Sig  Dispense  Refill  . bisacodyl (DULCOLAX) 5 MG EC tablet   Oral   Take 5 mg by mouth daily as needed for constipation.         . ferrous sulfate 325 (65 FE) MG tablet   Oral   Take 325 mg by mouth 2 (two) times  daily.         . furosemide (LASIX) 40 MG tablet   Oral   Take 1 tablet (40 mg total) by mouth 2 (two) times daily.   30 tablet      . glipiZIDE (GLUCOTROL) 5 MG tablet   Oral   Take 5 mg by mouth 2 (two) times daily before a meal.         . hydrALAZINE (APRESOLINE) 25 MG tablet   Oral   Take 12.5 mg by mouth 3 (three) times daily.         . hydrocerin (EUCERIN) CREA   Topical   Apply 1 application topically every morning. Apply to lower extremities         . hydrochlorothiazide (MICROZIDE) 12.5 MG capsule   Oral   Take 12.5 mg by mouth every morning.         . labetalol (NORMODYNE) 200 MG tablet   Oral   Take 200 mg by mouth 2 (two) times daily.         Marland Kitchen lisinopril (PRINIVIL,ZESTRIL) 10 MG tablet   Oral   Take 10 mg by mouth every morning.         Marland Kitchen omeprazole (PRILOSEC) 20 MG capsule   Oral   Take 40 mg by mouth every morning.          Marland Kitchen PARoxetine (PAXIL) 20 MG tablet   Oral   Take 20 mg by mouth every morning.         Bertram Gala Glycol-Propyl Glycol (SYSTANE) 0.4-0.3 % SOLN   Both Eyes   Place 1 drop into both eyes 3 (three) times daily.         . pravastatin (PRAVACHOL) 10 MG tablet   Oral   Take 10 mg by mouth every evening.         . senna-docusate (SENOKOT-S) 8.6-50 MG per tablet   Oral   Take 2 tablets by mouth every morning.         . terazosin (HYTRIN) 5 MG capsule   Oral   Take 5 mg by mouth every morning.          . traMADol (ULTRAM) 50 MG tablet   Oral   Take 1 tablet (50 mg total) by mouth every 6 (six) hours as needed for pain.   20 tablet   0    BP 176/72  Pulse 52  Temp(Src) 98.7 F (37.1 C) (Oral)  Resp 18  SpO2 99% Physical Exam  Nursing note and vitals reviewed. Constitutional: He is oriented to person, place, and time. He appears well-developed and well-nourished. No distress.  HENT:  Head: Normocephalic and atraumatic.  Eyes: Conjunctivae and EOM are normal. Right eye exhibits no discharge.  Left eye exhibits no discharge.  Neck: Normal range of motion. Neck supple. No tracheal deviation present.  Cardiovascular: Normal rate, regular rhythm and normal heart sounds.  Exam reveals no friction rub.   No murmur heard. Pulmonary/Chest: Effort normal and breath sounds normal. No stridor. No respiratory distress.  He has no wheezes. He has no rales. He exhibits no tenderness.  Abdominal: Soft. He exhibits no distension. There is no tenderness. There is no rebound and no guarding.  No pulsatile masses  Musculoskeletal:       Right hip: Normal.       Left hip: Normal.       Lumbar back: He exhibits tenderness (right para spinous TTpP) and spasm. He exhibits no bony tenderness (NO midline TTP) and no laceration.  Neurological: He is alert and oriented to person, place, and time.  No clonus bilaterally  Skin: Skin is warm.  Psychiatric: He has a normal mood and affect.    ED Course  Procedures (including critical care time) Labs Reviewed  CBC WITH DIFFERENTIAL - Abnormal; Notable for the following:    WBC 2.5 (*)    RBC 3.16 (*)    Hemoglobin 9.3 (*)    HCT 27.4 (*)    Neutro Abs 1.2 (*)    Monocytes Relative 22 (*)    All other components within normal limits  COMPREHENSIVE METABOLIC PANEL - Abnormal; Notable for the following:    Glucose, Bld 145 (*)    BUN 55 (*)    Creatinine, Ser 2.47 (*)    Total Bilirubin 0.2 (*)    GFR calc non Af Amer 24 (*)    GFR calc Af Amer 27 (*)    All other components within normal limits  URINALYSIS, ROUTINE W REFLEX MICROSCOPIC   No results found. 1. Back pain   2. Leukopenia    Results for orders placed during the hospital encounter of 02/26/13  CBC WITH DIFFERENTIAL      Result Value Range   WBC 2.5 (*) 4.0 - 10.5 K/uL   RBC 3.16 (*) 4.22 - 5.81 MIL/uL   Hemoglobin 9.3 (*) 13.0 - 17.0 g/dL   HCT 16.1 (*) 09.6 - 04.5 %   MCV 86.7  78.0 - 100.0 fL   MCH 29.4  26.0 - 34.0 pg   MCHC 33.9  30.0 - 36.0 g/dL   RDW 40.9  81.1 - 91.4 %    Platelets 259  150 - 400 K/uL   Neutrophils Relative % 49  43 - 77 %   Neutro Abs 1.2 (*) 1.7 - 7.7 K/uL   Lymphocytes Relative 29  12 - 46 %   Lymphs Abs 0.7  0.7 - 4.0 K/uL   Monocytes Relative 22 (*) 3 - 12 %   Monocytes Absolute 0.5  0.1 - 1.0 K/uL   Eosinophils Relative 1  0 - 5 %   Eosinophils Absolute 0.0  0.0 - 0.7 K/uL   Basophils Relative 0  0 - 1 %   Basophils Absolute 0.0  0.0 - 0.1 K/uL  COMPREHENSIVE METABOLIC PANEL      Result Value Range   Sodium 138  135 - 145 mEq/L   Potassium 4.8  3.5 - 5.1 mEq/L   Chloride 102  96 - 112 mEq/L   CO2 29  19 - 32 mEq/L   Glucose, Bld 145 (*) 70 - 99 mg/dL   BUN 55 (*) 6 - 23 mg/dL   Creatinine, Ser 7.82 (*) 0.50 - 1.35 mg/dL   Calcium 9.3  8.4 - 95.6 mg/dL   Total Protein 7.1  6.0 - 8.3 g/dL   Albumin 3.9  3.5 - 5.2 g/dL   AST 11  0 - 37 U/L   ALT 11  0 - 53 U/L   Alkaline Phosphatase  52  39 - 117 U/L   Total Bilirubin 0.2 (*) 0.3 - 1.2 mg/dL   GFR calc non Af Amer 24 (*) >90 mL/min   GFR calc Af Amer 27 (*) >90 mL/min  URINALYSIS, ROUTINE W REFLEX MICROSCOPIC      Result Value Range   Color, Urine YELLOW  YELLOW   APPearance CLEAR  CLEAR   Specific Gravity, Urine 1.014  1.005 - 1.030   pH 5.0  5.0 - 8.0   Glucose, UA NEGATIVE  NEGATIVE mg/dL   Hgb urine dipstick NEGATIVE  NEGATIVE   Bilirubin Urine NEGATIVE  NEGATIVE   Ketones, ur NEGATIVE  NEGATIVE mg/dL   Protein, ur NEGATIVE  NEGATIVE mg/dL   Urobilinogen, UA 0.2  0.0 - 1.0 mg/dL   Nitrite NEGATIVE  NEGATIVE   Leukocytes, UA NEGATIVE  NEGATIVE    MDM  Darol Destine 79 y.o. with hx of CKD, HTN, and DM presents with new RL lumbar back pain. No history of trauma or fall. AFVSS. NAD. No bowel/bladder incontinence. No LE weakness on exam. Strength symmetric bilaterally of lower extremities. No midline TTP on exam. No imaging at this time as no red flags and very low suspicion of fracture base on hx and PE. Although I have a low suspicion of AAA, bedside US  performed - officially indeterminate, but proximal aorta was 1cm and mid/distal aorta <1.5 cm, and bifurcation could not be visualized due to bowel gas. Again, Low suspicion for AAA. Patient has history of pancytopenia. Repeat CBC - persistent leuokpenia, Hgb at baseline and Platelets >250. Cr near previous at 2.47. No electrolyte abn. Gave tylenol and 1 dose of tramadol. UA neg for UTI. Instructed to fu with PCP for leukopenia and for back pain, which the patient said he will do. No NSAIDS due to renal function. Gave rx for tramadol to bridge to fu appt. Gave constipation precautions, which the patient understood. Reviewed labs. Discussed this patient with my attending, Dr. Preston Fleeting.   Sena Hitch, MD 02/27/13 [phone]

## 2013-03-04 ENCOUNTER — Encounter: Payer: Medicare Other | Admitting: Cardiovascular Disease

## 2013-03-04 NOTE — Progress Notes (Signed)
No show for appt today. cdm 

## 2013-03-05 ENCOUNTER — Ambulatory Visit: Payer: Self-pay | Admitting: Family Medicine

## 2013-03-26 ENCOUNTER — Emergency Department (HOSPITAL_COMMUNITY)
Admission: EM | Admit: 2013-03-26 | Discharge: 2013-03-26 | Disposition: A | Discharge status: Home / Self Care | Payer: Medicare Other | Attending: Emergency Medicine | Admitting: Emergency Medicine

## 2013-03-26 ENCOUNTER — Emergency Department (HOSPITAL_COMMUNITY): Payer: Medicare Other

## 2013-03-26 ENCOUNTER — Encounter (HOSPITAL_COMMUNITY): Payer: Self-pay | Admitting: Emergency Medicine

## 2013-03-26 DIAGNOSIS — I2510 Atherosclerotic heart disease of native coronary artery without angina pectoris: Secondary | ICD-10-CM | POA: Insufficient documentation

## 2013-03-26 DIAGNOSIS — R531 Weakness: Secondary | ICD-10-CM

## 2013-03-26 DIAGNOSIS — Z87891 Personal history of nicotine dependence: Secondary | ICD-10-CM | POA: Insufficient documentation

## 2013-03-26 DIAGNOSIS — E1149 Type 2 diabetes mellitus with other diabetic neurological complication: Secondary | ICD-10-CM | POA: Insufficient documentation

## 2013-03-26 DIAGNOSIS — Z8719 Personal history of other diseases of the digestive system: Secondary | ICD-10-CM | POA: Insufficient documentation

## 2013-03-26 DIAGNOSIS — N189 Chronic kidney disease, unspecified: Secondary | ICD-10-CM | POA: Insufficient documentation

## 2013-03-26 DIAGNOSIS — Z79899 Other long term (current) drug therapy: Secondary | ICD-10-CM | POA: Insufficient documentation

## 2013-03-26 DIAGNOSIS — E1142 Type 2 diabetes mellitus with diabetic polyneuropathy: Secondary | ICD-10-CM | POA: Insufficient documentation

## 2013-03-26 DIAGNOSIS — I2589 Other forms of chronic ischemic heart disease: Secondary | ICD-10-CM | POA: Insufficient documentation

## 2013-03-26 DIAGNOSIS — M1990 Unspecified osteoarthritis, unspecified site: Secondary | ICD-10-CM | POA: Insufficient documentation

## 2013-03-26 DIAGNOSIS — E669 Obesity, unspecified: Secondary | ICD-10-CM | POA: Insufficient documentation

## 2013-03-26 DIAGNOSIS — Z862 Personal history of diseases of the blood and blood-forming organs and certain disorders involving the immune mechanism: Secondary | ICD-10-CM | POA: Insufficient documentation

## 2013-03-26 DIAGNOSIS — I509 Heart failure, unspecified: Secondary | ICD-10-CM | POA: Insufficient documentation

## 2013-03-26 DIAGNOSIS — G47419 Narcolepsy without cataplexy: Secondary | ICD-10-CM | POA: Insufficient documentation

## 2013-03-26 DIAGNOSIS — I252 Old myocardial infarction: Secondary | ICD-10-CM | POA: Insufficient documentation

## 2013-03-26 DIAGNOSIS — I129 Hypertensive chronic kidney disease with stage 1 through stage 4 chronic kidney disease, or unspecified chronic kidney disease: Secondary | ICD-10-CM | POA: Insufficient documentation

## 2013-03-26 DIAGNOSIS — R5381 Other malaise: Secondary | ICD-10-CM | POA: Insufficient documentation

## 2013-03-26 DIAGNOSIS — N400 Enlarged prostate without lower urinary tract symptoms: Secondary | ICD-10-CM | POA: Insufficient documentation

## 2013-03-26 DIAGNOSIS — E785 Hyperlipidemia, unspecified: Secondary | ICD-10-CM | POA: Insufficient documentation

## 2013-03-26 DIAGNOSIS — Z8619 Personal history of other infectious and parasitic diseases: Secondary | ICD-10-CM | POA: Insufficient documentation

## 2013-03-26 LAB — BASIC METABOLIC PANEL
CO2: 26 mEq/L (ref 19–32)
Calcium: 9.6 mg/dL (ref 8.4–10.5)
Creatinine, Ser: 3 mg/dL — ABNORMAL HIGH (ref 0.50–1.35)
Glucose, Bld: 165 mg/dL — ABNORMAL HIGH (ref 70–99)

## 2013-03-26 LAB — CBC WITH DIFFERENTIAL/PLATELET
Eosinophils Relative: 0 % (ref 0–5)
HCT: 27.3 % — ABNORMAL LOW (ref 39.0–52.0)
Hemoglobin: 9.6 g/dL — ABNORMAL LOW (ref 13.0–17.0)
Lymphocytes Relative: 26 % (ref 12–46)
Lymphs Abs: 0.7 10*3/uL (ref 0.7–4.0)
MCV: 86.1 fL (ref 78.0–100.0)
Monocytes Absolute: 0.5 10*3/uL (ref 0.1–1.0)
Monocytes Relative: 20 % — ABNORMAL HIGH (ref 3–12)
RBC: 3.17 MIL/uL — ABNORMAL LOW (ref 4.22–5.81)
WBC: 2.7 10*3/uL — ABNORMAL LOW (ref 4.0–10.5)

## 2013-03-26 NOTE — ED Notes (Signed)
Pt arrived by [facility] from nursing facility. Per EMS nursing facility stated that pt could possibly have had a TIA with unequal grips and pt was unable to smile. EMS stated that at arrival pt had a negative stroke screen. Denies any pain BP-122/80 HR70 CBG 122

## 2013-03-26 NOTE — ED Notes (Signed)
daughter at bedside

## 2013-03-26 NOTE — ED Notes (Signed)
MD at bedside. 

## 2013-03-26 NOTE — ED Notes (Signed)
[facility] called to transport pt back to [facility].

## 2013-03-26 NOTE — ED Provider Notes (Signed)
CSN: [id number]     Arrival date & time 03/26/13  1411 History     None    Chief Complaint  Patient presents with  . Transient Ischemic Attack   (Consider location/radiation/quality/duration/timing/severity/associated sxs/prior Treatment) HPI Comments: 79 y.o. M with a PMH of DM, HTN, CKD, prior GI bleed presenting from SNF due to concern for possible TIA.  Per SNF, pt found to be unable to smile and unequal grip strength.  Onset about 30 mins prior to presentation. On EMS arrival, pt had negative stroke screen, vital signs stable, glucose WNL at 122.   Patient is a 79 y.o. male presenting with neurologic complaint. The history is provided by the nursing home, the EMS personnel and the patient.  Neurologic Problem This is a new problem. The current episode started today. The problem occurs constantly. The problem has been unchanged. Associated symptoms include weakness (per SNF- unequal grip strength). Pertinent negatives include no abdominal pain, chest pain, chills, coughing, diaphoresis, fatigue, fever, headaches, nausea, neck pain, numbness, rash, urinary symptoms, vertigo, visual change or vomiting. Nothing aggravates the symptoms. He has tried nothing for the symptoms. The treatment provided no relief.    Past Medical History  Diagnosis Date  . Smoker   . Narcolepsy   . Hypertension   . BPH (benign prostatic hyperplasia)   . Arthritis   . Diabetes mellitus   . Neuropathy in diabetes   . Herpes   . Dyslipidemia   . Obesity   . Lymphedema     LE lymphedemia with hx of cellulitis.  . CKD (chronic kidney disease)   . Anemia   . History of GI bleed 06/2012    EGD unremarkable;  Colo with large cecal polyp (bx benign)  . Ischemic cardiomyopathy     Echo 06/19/12: EF 40-45%, mid to distal anteroseptal HK, moderate LAE, PASP 37  . Chronic systolic heart failure   . CAD (coronary artery disease)     a. probable underlying CAD;  b. Type 2 NSTEMi in setting of profound anemia (Hgb  3.6) from GI bleed in 06/2012 and WMA on echo;  c. med Rx due to CKD and GI bleed   Past Surgical History  Procedure Laterality Date  . Leg surgery      Left leg surgery. broken leg  . Esophagogastroduodenoscopy  06/19/2012    Procedure: ESOPHAGOGASTRODUODENOSCOPY (EGD);  Surgeon: Theda Belfast, MD;  Location: [facility] ENDOSCOPY;  Service: Endoscopy;  Laterality: N/A;  . Colonoscopy  06/21/2012    Procedure: COLONOSCOPY;  Surgeon: Charna Elizabeth, MD;  Location: [facility] ENDOSCOPY;  Service: Endoscopy;  Laterality: N/A;   No family history on file. History  Substance Use Topics  . Smoking status: Former Smoker    Quit date: 08/26/1998  . Smokeless tobacco: Never Used  . Alcohol Use: No    Review of Systems  Constitutional: Negative for fever, chills, diaphoresis and fatigue.  HENT: Negative for neck pain.   Eyes: Negative for photophobia and visual disturbance.  Respiratory: Negative for cough, chest tightness, shortness of breath and wheezing.   Cardiovascular: Negative for chest pain, palpitations and leg swelling.  Gastrointestinal: Negative for nausea, vomiting and abdominal pain.  Genitourinary: Negative for difficulty urinating.  Musculoskeletal: Positive for back pain (chronic per pt).  Skin: Negative for color change, pallor and rash.  Neurological: Positive for weakness (per SNF- unequal grip strength). Negative for dizziness, vertigo, syncope, facial asymmetry, speech difficulty, light-headedness, numbness and headaches.  All other systems reviewed and are negative.  Allergies  Review of patient's allergies indicates no known allergies.  Home Medications   Current Outpatient Rx  Name  Route  Sig  Dispense  Refill  . bisacodyl (DULCOLAX) 5 MG EC tablet   Oral   Take 5 mg by mouth daily as needed for constipation.         . ferrous sulfate 325 (65 FE) MG tablet   Oral   Take 325 mg by mouth 2 (two) times daily.         . furosemide (LASIX) 40 MG tablet   Oral   Take  1 tablet (40 mg total) by mouth 2 (two) times daily.   30 tablet      . glipiZIDE (GLUCOTROL) 5 MG tablet   Oral   Take 5 mg by mouth 2 (two) times daily before a meal.         . hydrALAZINE (APRESOLINE) 25 MG tablet   Oral   Take 12.5 mg by mouth 3 (three) times daily.         . hydrocerin (EUCERIN) CREA   Topical   Apply 1 application topically every morning. Apply to lower extremities         . hydrochlorothiazide (MICROZIDE) 12.5 MG capsule   Oral   Take 12.5 mg by mouth every morning.         . labetalol (NORMODYNE) 200 MG tablet   Oral   Take 200 mg by mouth 2 (two) times daily.         Marland Kitchen lisinopril (PRINIVIL,ZESTRIL) 10 MG tablet   Oral   Take 10 mg by mouth every morning.         Marland Kitchen omeprazole (PRILOSEC) 20 MG capsule   Oral   Take 40 mg by mouth every morning.          Marland Kitchen PARoxetine (PAXIL) 20 MG tablet   Oral   Take 20 mg by mouth every morning.         Bertram Gala Glycol-Propyl Glycol (SYSTANE) 0.4-0.3 % SOLN   Both Eyes   Place 1 drop into both eyes 3 (three) times daily.         . pravastatin (PRAVACHOL) 10 MG tablet   Oral   Take 10 mg by mouth every evening.         . senna-docusate (SENOKOT-S) 8.6-50 MG per tablet   Oral   Take 2 tablets by mouth every morning.         . terazosin (HYTRIN) 5 MG capsule   Oral   Take 5 mg by mouth every morning.          . traMADol (ULTRAM) 50 MG tablet   Oral   Take 1 tablet (50 mg total) by mouth every 6 (six) hours as needed for pain.   20 tablet   0    BP 125/87  Pulse 65  Temp(Src) 98.1 F (36.7 C) (Oral)  Resp 16  SpO2 100% Physical Exam  Nursing note and vitals reviewed. Constitutional: He is oriented to person, place, and time. He appears well-developed and well-nourished. No distress.  HENT:  Head: Normocephalic.  Eyes: EOM are normal. Pupils are equal, round, and reactive to light.  Neck: Normal range of motion. Neck supple.  Cardiovascular: Normal rate, regular  rhythm, normal heart sounds and intact distal pulses.  Exam reveals no gallop and no friction rub.   No murmur heard. Pulmonary/Chest: Effort normal and breath sounds normal. No respiratory distress. He has no wheezes.  He has no rales. He exhibits no tenderness.  Abdominal: Soft. Bowel sounds are normal. He exhibits no distension and no mass. There is no tenderness. There is no rebound and no guarding.  Musculoskeletal: Normal range of motion. He exhibits no edema and no tenderness.  Neurological: He is alert and oriented to person, place, and time. He has normal strength. No cranial nerve deficit or sensory deficit. He exhibits normal muscle tone. He displays a negative Romberg sign. Coordination normal. GCS eye subscore is 4. GCS verbal subscore is 5. GCS motor subscore is 6.  Facial twitching of L face- pt does this occasionally at baseline per daughter  Skin: Skin is warm and dry. He is not diaphoretic.    ED Course   Procedures (including critical care time)  Labs Reviewed  CBC WITH DIFFERENTIAL - Abnormal; Notable for the following:    WBC 2.7 (*)    RBC 3.17 (*)    Hemoglobin 9.6 (*)    HCT 27.3 (*)    Platelets 143 (*)    Neutro Abs 1.5 (*)    Monocytes Relative 20 (*)    All other components within normal limits  BASIC METABOLIC PANEL - Abnormal; Notable for the following:    Glucose, Bld 165 (*)    BUN 67 (*)    Creatinine, Ser 3.00 (*)    GFR calc non Af Amer 19 (*)    GFR calc Af Amer 22 (*)    All other components within normal limits  PROTIME-INR   Ct Head Wo Contrast  03/26/2013   *RADIOLOGY REPORT*  Clinical Data: Transient ischemic attack.  The patient has unequal grips and is able to smile  CT HEAD WITHOUT CONTRAST  Technique:  Contiguous axial images were obtained from the base of the skull through the vertex without contrast.  Comparison: Jan 14, 2013  Findings: There is no midline shift, hydrocephalus, or mass effect. There is no acute hemorrhage or acute  transcortical infarct.  There is chronic diffuse atrophy.  Chronic bilateral periventricular white matter small vessel ischemic changes identified.  The bony calvarium is intact.  There is a small retention cyst in the left frontal sinus.  IMPRESSION: No focal acute intracranial abnormality identified.  Chronic diffuse atrophy.  Chronic bilateral periventricular white matter small vessel ischemic change.   Original Report Authenticated By: Sherian Rein, M.D.   1. Spell of generalized weakness   2. Chronic kidney disease, unspecified stage     MDM  79 y.o. M with a PMH of DM, HTN, CKD, prior GI bleed presenting from SNF due to concern for possible TIA.  Per SNF, pt found to be unable to smile and unequal grip strength.  On EMS arrival, pt had negative stroke screen, vital signs stable, glucose WNL at 122.    On presentation, pt mildly lethargic but oriented x 3.  L-sided facial twitching noted which daughter states pt does occasionally at baseline although states he is doing it more frequently now.  No focal neuro deficits on exam, grip strength equal.  Pt denies HA, neck pain, numbness, tingling, chest pain, or dyspnea.  CT head obtained to r/o CVA.  EKG obtained for possible ACS although pt denies active chest pain.  CBC, BMP, INR obtained.  CT head with no acute abnormalities;  Chronic small vessel ischemic changes noted.  EKG with NSR, no ectopy, no acute ST-T wave changes.  Lab results show CKD with creatinine mildly elevated above baseline;  No electrolyte abnormalities.  Pt re-evaluated and states he is feeling well; denies HA, weakness, or numbness.  Given normal findings above with no focal neuro deficits, doubt CVA at this time.  Pt stable for discharge with close PCP f/u.  ED return precautions given.  No further questions or concerns, stable at discharge.  Discussed with attending Dr. Judd Lien.  Jodean Lima, MD 03/27/13 [phone]

## 2013-03-27 NOTE — ED Provider Notes (Signed)
I saw and evaluated the patient, reviewed the resident's note and I agree with the findings and plan. The patient was sent to the ED for evaluation of weakness, concerns over tia/cva.  He stays at an ecf and was found there to be weaker than normal.  The staff there had concerns that he was weak and noted a twitch in the left side of the face.  He denies to me that he has any pain or other complaints.  He tells me that he has been weak for years.  He seems somewhat annoyed by the fact that he is in the ED now.  On exam, the patient is afebrile and the vitals are stable.  He is alert and oriented and in no distress.  The heart is regular rate and rhythm and the lungs are clear.  The abdomen is soft, non-tender, and non-distended.  The extremities are without edema.  Neurologically, the cranial nerves are intact without deficits.  Strength is 5/5 in the BUE and BLE.    The patient was sent here for evaluation of possible tia.  His neurological exam is non-focal and the workup is unremarkable.  The ekg is unremarkable.  He wants to leave and I feel as though this is appropriate.  Will discharge to home, to return prn.   Date: 03/27/2013  Rate: 60's  Rhythm: normal sinus rhythm  QRS Axis: normal  Intervals: normal  ST/T Wave abnormalities: normal  Conduction Disutrbances:none  Narrative Interpretation:   Old EKG Reviewed: unchanged    Geoffery Lyons, MD 03/27/13 [phone]

## 2013-04-09 ENCOUNTER — Encounter (HOSPITAL_COMMUNITY): Payer: Self-pay | Admitting: Emergency Medicine

## 2013-04-09 ENCOUNTER — Emergency Department (HOSPITAL_COMMUNITY)
Admission: EM | Admit: 2013-04-09 | Discharge: 2013-04-09 | Disposition: A | Discharge status: Home / Self Care | Payer: Medicare Other | Attending: Emergency Medicine | Admitting: Emergency Medicine

## 2013-04-09 DIAGNOSIS — E1149 Type 2 diabetes mellitus with other diabetic neurological complication: Secondary | ICD-10-CM | POA: Insufficient documentation

## 2013-04-09 DIAGNOSIS — Z79899 Other long term (current) drug therapy: Secondary | ICD-10-CM | POA: Insufficient documentation

## 2013-04-09 DIAGNOSIS — Z8739 Personal history of other diseases of the musculoskeletal system and connective tissue: Secondary | ICD-10-CM | POA: Insufficient documentation

## 2013-04-09 DIAGNOSIS — E1142 Type 2 diabetes mellitus with diabetic polyneuropathy: Secondary | ICD-10-CM | POA: Insufficient documentation

## 2013-04-09 DIAGNOSIS — I129 Hypertensive chronic kidney disease with stage 1 through stage 4 chronic kidney disease, or unspecified chronic kidney disease: Secondary | ICD-10-CM | POA: Insufficient documentation

## 2013-04-09 DIAGNOSIS — E785 Hyperlipidemia, unspecified: Secondary | ICD-10-CM | POA: Insufficient documentation

## 2013-04-09 DIAGNOSIS — Z8679 Personal history of other diseases of the circulatory system: Secondary | ICD-10-CM | POA: Insufficient documentation

## 2013-04-09 DIAGNOSIS — Z8669 Personal history of other diseases of the nervous system and sense organs: Secondary | ICD-10-CM | POA: Insufficient documentation

## 2013-04-09 DIAGNOSIS — I2510 Atherosclerotic heart disease of native coronary artery without angina pectoris: Secondary | ICD-10-CM | POA: Insufficient documentation

## 2013-04-09 DIAGNOSIS — E669 Obesity, unspecified: Secondary | ICD-10-CM | POA: Insufficient documentation

## 2013-04-09 DIAGNOSIS — I10 Essential (primary) hypertension: Secondary | ICD-10-CM

## 2013-04-09 DIAGNOSIS — Z8719 Personal history of other diseases of the digestive system: Secondary | ICD-10-CM | POA: Insufficient documentation

## 2013-04-09 DIAGNOSIS — N189 Chronic kidney disease, unspecified: Secondary | ICD-10-CM | POA: Insufficient documentation

## 2013-04-09 DIAGNOSIS — N400 Enlarged prostate without lower urinary tract symptoms: Secondary | ICD-10-CM | POA: Insufficient documentation

## 2013-04-09 DIAGNOSIS — Z8619 Personal history of other infectious and parasitic diseases: Secondary | ICD-10-CM | POA: Insufficient documentation

## 2013-04-09 DIAGNOSIS — Z87891 Personal history of nicotine dependence: Secondary | ICD-10-CM | POA: Insufficient documentation

## 2013-04-09 DIAGNOSIS — I5022 Chronic systolic (congestive) heart failure: Secondary | ICD-10-CM | POA: Insufficient documentation

## 2013-04-09 DIAGNOSIS — D649 Anemia, unspecified: Secondary | ICD-10-CM | POA: Insufficient documentation

## 2013-04-09 LAB — GLUCOSE, CAPILLARY: Glucose-Capillary: 149 mg/dL — ABNORMAL HIGH (ref 70–99)

## 2013-04-09 NOTE — ED Provider Notes (Signed)
I saw and evaluated the patient, reviewed the resident's note and I agree with the findings and plan.  79 year old male with hypertension. Asymptomatic. There is no indication for emergent blood pressure reduction or further workup in regard to this. Small pressure is likely been poorly controlled secondary to him being noncompliant with his medications. He is on medications for diabetes. Is unclear she's actually taking these or not. He is not toxic and has no complaints. CBG is reasonable. And after that he needs further workup in regards to this either. I feel he is stable for discharge back to skilled nursing facility. Discussed with patient the importance of compliance with his medication and reviewed indications for taking these. Discussed that if he feels that's his medications are negatively affecting how he feels that he needs to discuss this specifically with the prescribing provider.  Raeford Razor, MD 04/09/13 [phone]

## 2013-04-09 NOTE — ED Provider Notes (Signed)
CSN: [id number]     Arrival date & time 04/09/13  1005 History     First MD Initiated Contact with Patient 04/09/13 1005     Chief Complaint  Patient presents with  . Hypertension   (Consider location/radiation/quality/duration/timing/severity/associated sxs/prior Treatment) HPI 79 year old male with multiple medical problems including HTN presents from Nursing home with elevated BP.  History obtained per patient and EMS. Patient brought in via EMS with reported worsening HTN.  BP found to be markedly elevated 260/110 in route.   Patient denies any complaints currently.  He states that his BP has been elevated because he is refusing to take his medications to "prove a point."  He denies headache, SOB, weakness, chest pain, nausea, vomiting, diarrhea. He reports no pain and states he is feeling well.  Past Medical History  Diagnosis Date  . Smoker   . Narcolepsy   . Hypertension   . BPH (benign prostatic hyperplasia)   . Arthritis   . Diabetes mellitus   . Neuropathy in diabetes   . Herpes   . Dyslipidemia   . Obesity   . Lymphedema     LE lymphedemia with hx of cellulitis.  . CKD (chronic kidney disease)   . Anemia   . History of GI bleed 06/2012    EGD unremarkable;  Colo with large cecal polyp (bx benign)  . Ischemic cardiomyopathy     Echo 06/19/12: EF 40-45%, mid to distal anteroseptal HK, moderate LAE, PASP 37  . Chronic systolic heart failure   . CAD (coronary artery disease)     a. probable underlying CAD;  b. Type 2 NSTEMi in setting of profound anemia (Hgb 3.6) from GI bleed in 06/2012 and WMA on echo;  c. med Rx due to CKD and GI bleed   Past Surgical History  Procedure Laterality Date  . Leg surgery      Left leg surgery. broken leg  . Esophagogastroduodenoscopy  06/19/2012    Procedure: ESOPHAGOGASTRODUODENOSCOPY (EGD);  Surgeon: Theda Belfast, MD;  Location: [facility] ENDOSCOPY;  Service: Endoscopy;  Laterality: N/A;  . Colonoscopy  06/21/2012    Procedure:  COLONOSCOPY;  Surgeon: Charna Elizabeth, MD;  Location: [facility] ENDOSCOPY;  Service: Endoscopy;  Laterality: N/A;   History reviewed. No pertinent family history. History  Substance Use Topics  . Smoking status: Former Smoker    Quit date: 08/26/1998  . Smokeless tobacco: Never Used  . Alcohol Use: No    Review of Systems  Constitutional: Negative for fever, chills and activity change.  Respiratory: Negative for chest tightness and shortness of breath.   Cardiovascular: Negative for chest pain.  Gastrointestinal: Negative for nausea, vomiting, abdominal pain and diarrhea.  Genitourinary: Negative for difficulty urinating.  Musculoskeletal: Negative.   Skin: Negative.   Neurological: Negative for speech difficulty, weakness and headaches.   Allergies  Review of patient's allergies indicates no known allergies.  Home Medications   Current Outpatient Rx  Name  Route  Sig  Dispense  Refill  . bisacodyl (DULCOLAX) 5 MG EC tablet   Oral   Take 5 mg by mouth daily as needed for constipation.         . ferrous sulfate 325 (65 FE) MG tablet   Oral   Take 325 mg by mouth 2 (two) times daily.         . furosemide (LASIX) 40 MG tablet   Oral   Take 1 tablet (40 mg total) by mouth 2 (two) times daily.  30 tablet      . glipiZIDE (GLUCOTROL) 5 MG tablet   Oral   Take 5 mg by mouth 2 (two) times daily before a meal.         . hydrALAZINE (APRESOLINE) 25 MG tablet   Oral   Take 12.5 mg by mouth 3 (three) times daily.         . hydrocerin (EUCERIN) CREA   Topical   Apply 1 application topically every morning. Apply to lower extremities         . hydrochlorothiazide (MICROZIDE) 12.5 MG capsule   Oral   Take 12.5 mg by mouth every morning.         . labetalol (NORMODYNE) 200 MG tablet   Oral   Take 200 mg by mouth 2 (two) times daily.         Marland Kitchen lisinopril (PRINIVIL,ZESTRIL) 10 MG tablet   Oral   Take 10 mg by mouth every morning.         Marland Kitchen omeprazole (PRILOSEC)  20 MG capsule   Oral   Take 40 mg by mouth every morning.          Marland Kitchen PARoxetine (PAXIL) 20 MG tablet   Oral   Take 20 mg by mouth every morning.         Bertram Gala Glycol-Propyl Glycol (SYSTANE) 0.4-0.3 % SOLN   Both Eyes   Place 1 drop into both eyes 3 (three) times daily.         . pravastatin (PRAVACHOL) 10 MG tablet   Oral   Take 10 mg by mouth every evening.         . senna-docusate (SENOKOT-S) 8.6-50 MG per tablet   Oral   Take 2 tablets by mouth every morning.         Marland Kitchen EXPIRED: terazosin (HYTRIN) 5 MG capsule   Oral   Take 5 mg by mouth every morning.          . traMADol (ULTRAM) 50 MG tablet   Oral   Take 1 tablet (50 mg total) by mouth every 6 (six) hours as needed for pain.   20 tablet   0    BP 159/92  Pulse 87  Temp(Src) 97.5 F (36.4 C) (Oral)  Resp 20  SpO2 99% Physical Exam  Constitutional: He is oriented to person, place, and time. He appears well-developed and well-nourished.  HENT:  Head: Normocephalic and atraumatic.  Eyes: EOM are normal. Pupils are equal, round, and reactive to light. No scleral icterus.  Neck: Neck supple.  Cardiovascular: Normal rate and regular rhythm.   Slight 2/6 systolic murmur noted.  Pulmonary/Chest: Effort normal. He has no wheezes. He has no rales.  Abdominal: Soft. Bowel sounds are normal. He exhibits no distension. There is no tenderness. There is no rebound and no guarding.  Musculoskeletal: He exhibits no edema.  Neurological: He is alert and oriented to person, place, and time. No cranial nerve deficit.  Muscle strength 5/5 in all extremities.  Appears to have facial tremulousness; this has been documented on prior exams.  Patient also appears to fall asleep during examination but awakens and answers questions appropriately.   ED Course   Procedures (including critical care time)  Date: 04/09/2013  Rate: 79  Rhythm: normal sinus rhythm  QRS Axis: normal  Intervals: PR prolonged  ST/T Wave  abnormalities: nonspecific ST changes and nonspecific T wave changes  Conduction Disutrbances:none  Narrative Interpretation: NSR with ST and T wave changes  unchanged from prior.  Old EKG Reviewed: unchanged  Labs Reviewed - No data to display No results found. No diagnosis found.  MDM  79 year old male with HTN presents with reports of elevated BP.  - Patient reports noncompliance with medication. - BP mildly elevated at 159/92 in the ED.   - Neurological exam revealed no focal deficits.  Remainder of exam also benign. - patient stable for discharge.    Tommie Sams, DO 04/09/13 1128

## 2013-04-09 NOTE — ED Notes (Signed)
EMS called to for hypertension and nursing home states that he has been having trouble walking with walker. Pt states he has been refusing meds since 8/6 because he felt he was being overmedicated and meds make him feel bad.

## 2013-05-03 ENCOUNTER — Encounter (HOSPITAL_COMMUNITY): Payer: Self-pay | Admitting: Emergency Medicine

## 2013-05-03 ENCOUNTER — Emergency Department (HOSPITAL_COMMUNITY): Payer: Medicare Other

## 2013-05-03 ENCOUNTER — Emergency Department (HOSPITAL_COMMUNITY)
Admission: EM | Admit: 2013-05-03 | Discharge: 2013-05-04 | Disposition: A | Discharge status: Home / Self Care | Payer: Medicare Other | Attending: Emergency Medicine | Admitting: Emergency Medicine

## 2013-05-03 DIAGNOSIS — I129 Hypertensive chronic kidney disease with stage 1 through stage 4 chronic kidney disease, or unspecified chronic kidney disease: Secondary | ICD-10-CM | POA: Insufficient documentation

## 2013-05-03 DIAGNOSIS — E785 Hyperlipidemia, unspecified: Secondary | ICD-10-CM | POA: Insufficient documentation

## 2013-05-03 DIAGNOSIS — N189 Chronic kidney disease, unspecified: Secondary | ICD-10-CM | POA: Insufficient documentation

## 2013-05-03 DIAGNOSIS — I5022 Chronic systolic (congestive) heart failure: Secondary | ICD-10-CM | POA: Insufficient documentation

## 2013-05-03 DIAGNOSIS — I2510 Atherosclerotic heart disease of native coronary artery without angina pectoris: Secondary | ICD-10-CM | POA: Insufficient documentation

## 2013-05-03 DIAGNOSIS — Z87891 Personal history of nicotine dependence: Secondary | ICD-10-CM | POA: Insufficient documentation

## 2013-05-03 DIAGNOSIS — Z8719 Personal history of other diseases of the digestive system: Secondary | ICD-10-CM | POA: Insufficient documentation

## 2013-05-03 DIAGNOSIS — E1142 Type 2 diabetes mellitus with diabetic polyneuropathy: Secondary | ICD-10-CM | POA: Insufficient documentation

## 2013-05-03 DIAGNOSIS — Z8739 Personal history of other diseases of the musculoskeletal system and connective tissue: Secondary | ICD-10-CM | POA: Insufficient documentation

## 2013-05-03 DIAGNOSIS — R609 Edema, unspecified: Secondary | ICD-10-CM | POA: Insufficient documentation

## 2013-05-03 DIAGNOSIS — N400 Enlarged prostate without lower urinary tract symptoms: Secondary | ICD-10-CM | POA: Insufficient documentation

## 2013-05-03 DIAGNOSIS — Z9114 Patient's other noncompliance with medication regimen: Secondary | ICD-10-CM

## 2013-05-03 DIAGNOSIS — E1149 Type 2 diabetes mellitus with other diabetic neurological complication: Secondary | ICD-10-CM | POA: Insufficient documentation

## 2013-05-03 DIAGNOSIS — D649 Anemia, unspecified: Secondary | ICD-10-CM | POA: Insufficient documentation

## 2013-05-03 DIAGNOSIS — E669 Obesity, unspecified: Secondary | ICD-10-CM | POA: Insufficient documentation

## 2013-05-03 DIAGNOSIS — Z8619 Personal history of other infectious and parasitic diseases: Secondary | ICD-10-CM | POA: Insufficient documentation

## 2013-05-03 HISTORY — DX: Unspecified atrial fibrillation: I48.91

## 2013-05-03 HISTORY — DX: Heart failure, unspecified: I50.9

## 2013-05-03 LAB — COMPREHENSIVE METABOLIC PANEL
Alkaline Phosphatase: 61 U/L (ref 39–117)
BUN: 22 mg/dL (ref 6–23)
CO2: 27 mEq/L (ref 19–32)
GFR calc Af Amer: 44 mL/min — ABNORMAL LOW (ref >90)
GFR calc non Af Amer: 38 mL/min — ABNORMAL LOW (ref >90)
Glucose, Bld: 170 mg/dL — ABNORMAL HIGH (ref 70–99)
Potassium: 4.4 mEq/L (ref 3.5–5.1)
Total Protein: 7.5 g/dL (ref 6.0–8.3)

## 2013-05-03 LAB — CBC WITH DIFFERENTIAL/PLATELET
Eosinophils Absolute: 0 10*3/uL (ref 0.0–0.7)
Eosinophils Relative: 1 % (ref 0–5)
Hemoglobin: 10.2 g/dL — ABNORMAL LOW (ref 13.0–17.0)
Lymphocytes Relative: 25 % (ref 12–46)
Lymphs Abs: 0.8 10*3/uL (ref 0.7–4.0)
MCH: 29.7 pg (ref 26.0–34.0)
MCV: 87.8 fL (ref 78.0–100.0)
Monocytes Relative: 23 % — ABNORMAL HIGH (ref 3–12)
Neutrophils Relative %: 51 % (ref 43–77)
RBC: 3.44 MIL/uL — ABNORMAL LOW (ref 4.22–5.81)

## 2013-05-03 LAB — PRO B NATRIURETIC PEPTIDE: Pro B Natriuretic peptide (BNP): 1064 pg/mL — ABNORMAL HIGH (ref 0–450)

## 2013-05-03 MED ORDER — FUROSEMIDE 20 MG PO TABS
40.0000 mg | ORAL_TABLET | Freq: Once | ORAL | Status: AC
  Administered 2013-05-03: 40 mg via ORAL
  Filled 2013-05-03: qty 2

## 2013-05-03 NOTE — ED Notes (Addendum)
Pt c/o bilateral leg swelling x weeks; pt sts hx of similar; unsure if pt takes diuretics; pt denies SOB

## 2013-05-03 NOTE — ED Notes (Signed)
Patient transported to X-ray 

## 2013-05-03 NOTE — ED Provider Notes (Signed)
CSN: [id number]     Arrival date & time 05/03/13  1700 History   First MD Initiated Contact with Patient 05/03/13 2004     Chief Complaint  Patient presents with  . Leg Swelling    HPI Pt was seen at 2020. Per pt, c/o gradual onset and persistence of constant bilat LE's "swelling" for the past several years. States he has not been taking his "fluid pills" for the past 2 weeks "because the NH won't tell me what medicines they are giving me."  States he tried to make an appt with his PMD "but the NH cancelled it." States he "wants his legs wrapped up." Denies any other complaints. Denies CP/palpitations, no SOB/cough, no abd pain, no N/V/D, no legs pain, no rash, no fevers.    Past Medical History  Diagnosis Date  . Smoker   . Narcolepsy   . Hypertension   . BPH (benign prostatic hyperplasia)   . Arthritis   . Diabetes mellitus   . Neuropathy in diabetes   . Herpes   . Dyslipidemia   . Obesity   . Lymphedema     LE lymphedemia with hx of cellulitis.  . CKD (chronic kidney disease)   . Anemia   . History of GI bleed 06/2012    EGD unremarkable;  Colo with large cecal polyp (bx benign)  . Ischemic cardiomyopathy     Echo 06/19/12: EF 40-45%, mid to distal anteroseptal HK, moderate LAE, PASP 37  . Chronic systolic heart failure   . CAD (coronary artery disease)     a. probable underlying CAD;  b. Type 2 NSTEMi in setting of profound anemia (Hgb 3.6) from GI bleed in 06/2012 and WMA on echo;  c. med Rx due to CKD and GI bleed  . CHF (congestive heart failure)   . Atrial fibrillation    Past Surgical History  Procedure Laterality Date  . Leg surgery      Left leg surgery. broken leg  . Esophagogastroduodenoscopy  06/19/2012    Procedure: ESOPHAGOGASTRODUODENOSCOPY (EGD);  Surgeon: Theda Belfast, MD;  Location: [facility] ENDOSCOPY;  Service: Endoscopy;  Laterality: N/A;  . Colonoscopy  06/21/2012    Procedure: COLONOSCOPY;  Surgeon: Charna Elizabeth, MD;  Location: [facility] ENDOSCOPY;  Service:  Endoscopy;  Laterality: N/A;   History reviewed. No pertinent family history. History  Substance Use Topics  . Smoking status: Former Smoker    Quit date: 08/26/1998  . Smokeless tobacco: Never Used  . Alcohol Use: No    Review of Systems ROS: Statement: All systems negative except as marked or noted in the HPI; Constitutional: Negative for fever and chills. ; ; Eyes: Negative for eye pain, redness and discharge. ; ; ENMT: Negative for ear pain, hoarseness, nasal congestion, sinus pressure and sore throat. ; ; Cardiovascular: Negative for chest pain, palpitations, diaphoresis, dyspnea and +peripheral edema. ; ; Respiratory: Negative for cough, wheezing and stridor. ; ; Gastrointestinal: Negative for nausea, vomiting, diarrhea, abdominal pain, blood in stool, hematemesis, jaundice and rectal bleeding. . ; ; Genitourinary: Negative for dysuria, flank pain and hematuria. ; ; Musculoskeletal: Negative for back pain and neck pain. Negative for swelling and trauma.; ; Skin: Negative for pruritus, rash, abrasions, blisters, bruising and skin lesion.; ; Neuro: Negative for headache, lightheadedness and neck stiffness. Negative for weakness, altered level of consciousness , altered mental status, extremity weakness, paresthesias, involuntary movement, seizure and syncope.       Allergies  Review of patient's allergies indicates no known  allergies.  Home Medications   Current Outpatient Rx  Name  Route  Sig  Dispense  Refill  . bisacodyl (DULCOLAX) 5 MG EC tablet   Oral   Take 5 mg by mouth daily as needed for constipation.         . ferrous sulfate 325 (65 FE) MG tablet   Oral   Take 325 mg by mouth 2 (two) times daily.         . furosemide (LASIX) 40 MG tablet   Oral   Take 1 tablet (40 mg total) by mouth 2 (two) times daily.   30 tablet      . glipiZIDE (GLUCOTROL) 5 MG tablet   Oral   Take 5 mg by mouth 2 (two) times daily before a meal.         . hydrALAZINE (APRESOLINE) 25  MG tablet   Oral   Take 12.5 mg by mouth 3 (three) times daily.         . hydrocerin (EUCERIN) CREA   Topical   Apply 1 application topically every morning. Apply to lower extremities         . labetalol (NORMODYNE) 200 MG tablet   Oral   Take 200 mg by mouth 2 (two) times daily.         Marland Kitchen lisinopril (PRINIVIL,ZESTRIL) 10 MG tablet   Oral   Take 10 mg by mouth every morning.         Marland Kitchen omeprazole (PRILOSEC) 20 MG capsule   Oral   Take 40 mg by mouth every morning.          Bertram Gala Glycol-Propyl Glycol (SYSTANE) 0.4-0.3 % SOLN   Both Eyes   Place 1 drop into both eyes 3 (three) times daily.         . pravastatin (PRAVACHOL) 10 MG tablet   Oral   Take 10 mg by mouth every evening.         . terazosin (HYTRIN) 5 MG capsule   Oral   Take 5 mg by mouth every morning.           BP 169/72  Pulse 70  Temp(Src) 97.9 F (36.6 C) (Oral)  Resp 16  SpO2 98% Physical Exam 2025: Physical examination:  Nursing notes reviewed; Vital signs and O2 SAT reviewed;  Constitutional: Well developed, Well nourished, Well hydrated, In no acute distress; Head:  Normocephalic, atraumatic; Eyes: EOMI, PERRL, No scleral icterus; ENMT: Mouth and pharynx normal, Mucous membranes moist; Neck: Supple, Full range of motion, No lymphadenopathy; Cardiovascular: Regular rate and rhythm, No gallop; Respiratory: Breath sounds clear & equal bilaterally, No rales, rhonchi, wheezes.  Speaking full sentences with ease, Normal respiratory effort/excursion; Chest: Nontender, Movement normal; Abdomen: Soft, Nontender, Nondistended, Normal bowel sounds; Genitourinary: No CVA tenderness; Extremities: Pulses normal, No tenderness, +3 pedal edema and chronic stasis changes bilat without calf asymmetry. No rash. No deformity.; Neuro: AA&Ox3, Major CN grossly intact.  Speech clear. No gross focal motor or sensory deficits in extremities.; Skin: Color normal, Warm, Dry.   ED Course  Procedures   2030:  Pt  has long hx of bilat LE's lymphedema. States he has been refusing to take his meds at the NH because "they don't tell me what they're giving me." D/W pt my myself and ED RN regarding medication compliance and regular PMD f/u for good control of his chronic medical conditions. Pt verb understanding and states he "knows I need to talk to my doctor about this  stuff." States he took a cab to the ED tonight because he wanted his legs "wrapped up."  ED RN will apply kerlex DSD to bilat LE's to appease pt. Pt has ambulated in the ED with steady gait and easy resps, denies CP/SOB. Will check labs/CXR.   2345:  Pt with elevated BNP, no old to compare. No acute CHF on CXR. Pt given dose of lasix PO here, which he took from the ED RN without argument. Pt ambulated around the ED several times with his rolling walker without CP/SOB. Wants to leave now. Pt strongly encouraged to take his meds daily and f/u with his PMD this week. Verb understanding.    MDM  MDM Reviewed: previous chart, nursing note and vitals Reviewed previous: labs Interpretation: labs and x-ray   Results for orders placed during the hospital encounter of 05/03/13  CBC WITH DIFFERENTIAL      Result Value Range   WBC 3.2 (*) 4.0 - 10.5 K/uL   RBC 3.44 (*) 4.22 - 5.81 MIL/uL   Hemoglobin 10.2 (*) 13.0 - 17.0 g/dL   HCT 40.9 (*) 81.1 - 91.4 %   MCV 87.8  78.0 - 100.0 fL   MCH 29.7  26.0 - 34.0 pg   MCHC 33.8  30.0 - 36.0 g/dL   RDW 78.2  95.6 - 21.3 %   Platelets 245  150 - 400 K/uL   Neutrophils Relative % 51  43 - 77 %   Neutro Abs 1.7  1.7 - 7.7 K/uL   Lymphocytes Relative 25  12 - 46 %   Lymphs Abs 0.8  0.7 - 4.0 K/uL   Monocytes Relative 23 (*) 3 - 12 %   Monocytes Absolute 0.8  0.1 - 1.0 K/uL   Eosinophils Relative 1  0 - 5 %   Eosinophils Absolute 0.0  0.0 - 0.7 K/uL   Basophils Relative 0  0 - 1 %   Basophils Absolute 0.0  0.0 - 0.1 K/uL  COMPREHENSIVE METABOLIC PANEL      Result Value Range   Sodium 134 (*) 135 - 145  mEq/L   Potassium 4.4  3.5 - 5.1 mEq/L   Chloride 100  96 - 112 mEq/L   CO2 27  19 - 32 mEq/L   Glucose, Bld 170 (*) 70 - 99 mg/dL   BUN 22  6 - 23 mg/dL   Creatinine, Ser 0.86 (*) 0.50 - 1.35 mg/dL   Calcium 9.2  8.4 - 57.8 mg/dL   Total Protein 7.5  6.0 - 8.3 g/dL   Albumin 4.0  3.5 - 5.2 g/dL   AST 11  0 - 37 U/L   ALT 10  0 - 53 U/L   Alkaline Phosphatase 61  39 - 117 U/L   Total Bilirubin 0.3  0.3 - 1.2 mg/dL   GFR calc non Af Amer 38 (*) >90 mL/min   GFR calc Af Amer 44 (*) >90 mL/min  PRO B NATRIURETIC PEPTIDE      Result Value Range   Pro B Natriuretic peptide (BNP) 1064.0 (*) 0 - 450 pg/mL   Dg Chest 2 View 05/03/2013   *RADIOLOGY REPORT*  Clinical Data: Leg swelling.  History of diabetes, hypertension, smoker.  CHEST - 2 VIEW  Comparison: 06/20/2012  Findings: Mild cardiac enlargement.  Pulmonary vascularity is normal.  No edema.  No focal consolidation.  No blunting of costophrenic angles.  No pneumothorax.  Calcified and tortuous aorta.  Degenerative changes in the spine.  IMPRESSION:  Cardiac enlargement without vascular congestion or edema.  No focal consolidation.   Original Report Authenticated By: Burman Nieves, M.D.    Results for PARISH, DUBOSE (MRN [id number]) as of 05/03/2013 23:55  Ref. Range 02/26/2013 16:32 03/26/2013 16:00 05/03/2013 17:10  BUN Latest Range: 6-23 mg/dL 55 (H) 67 (H) 22  Creatinine Latest Range: 0.50-1.35 mg/dL 7.82 (H) 9.56 (H) 2.13 (H)     Results for FOUNTAIN, DERUSHA (MRN [id number]) as of 05/03/2013 23:55  Ref. Range 09/08/2012 11:48 01/14/2013 01:05 02/26/2013 16:32 03/26/2013 16:00 05/03/2013 17:10  Hemoglobin Latest Range: 13.0-17.0 g/dL 8.2 (L) 8.9 (L) 9.3 (L) 9.6 (L) 10.2 (L)  HCT Latest Range: 39.0-52.0 % 25.2 (L) 26.7 (L) 27.4 (L) 27.3 (L) 30.2 (L)      Laray Anger, DO 05/06/13 2254

## 2013-05-03 NOTE — ED Notes (Addendum)
Pt reports he is from [facility] assisted living facility - pt concerned he is being given too much of his "fluid pills" and has requested the facility to tell him what medications he is taking and states the facility staff have refused to tell him thus the pt has refused to take his prescribed medications x2 weeks. Pt has been experiencing progressively worse bilat lower extremity edema - +3 pitting edema and LEs are weeping clear drainage as well - pt denies any shortness of breath, chest pain, cough. Pt A&Ox4, in no acute distress.

## 2013-05-06 ENCOUNTER — Ambulatory Visit (INDEPENDENT_AMBULATORY_CARE_PROVIDER_SITE_OTHER): Payer: Medicare Other | Admitting: Family Medicine

## 2013-05-06 VITALS — BP 150/90 | HR 90 | Wt 260.0 lb

## 2013-05-06 DIAGNOSIS — Z91199 Patient's noncompliance with other medical treatment and regimen due to unspecified reason: Secondary | ICD-10-CM

## 2013-05-06 DIAGNOSIS — I890 Lymphedema, not elsewhere classified: Secondary | ICD-10-CM

## 2013-05-06 DIAGNOSIS — Z9119 Patient's noncompliance with other medical treatment and regimen: Secondary | ICD-10-CM

## 2013-05-06 DIAGNOSIS — I10 Essential (primary) hypertension: Secondary | ICD-10-CM

## 2013-05-06 DIAGNOSIS — E669 Obesity, unspecified: Secondary | ICD-10-CM

## 2013-05-06 DIAGNOSIS — E119 Type 2 diabetes mellitus without complications: Secondary | ICD-10-CM

## 2013-05-06 NOTE — Progress Notes (Signed)
  Subjective:    Patient ID: [name], male    DOB: 1934/04/01, 79 y.o.   MRN: [id number]  HPI He is now living in assisted living. They are taking care of his medications over he apparently is a challenging them and he states have not been giving him the medications. The medication record was reviewed and indicates that he is indeed getting the medication. He recently went to the emergency room to have his legs wrapped. He has a previous history of chronic lymphedema. In the past this had been cared for conservatively and his legs did improve tremendously.   Review of Systems     Objective:   Physical Exam Alert and in no distress. Both lower extremities are now wrapped and do show tremendous edema.       Assessment & Plan:  Diabetes mellitus  Hypertension  Obesity  Personal history of noncompliance with medical treatment, presenting hazards to health  Chronic acquired lymphedema  He is a very difficult gentleman to take care of. He is a very poor historian. I reinforced the need for him to stay on his medications. They were reviewed with his health care facility. I also strongly encouraged him to stay there for better care. We will have home health come by and changed his dressings regularly.

## 2013-05-17 ENCOUNTER — Telehealth: Payer: Self-pay | Admitting: Internal Medicine

## 2013-05-17 NOTE — Telephone Encounter (Signed)
DR.LALONDE YOU WANTED ME TO GET SOMEONE OUT TO WRAP HIS LEGS I SENT REFERRAL TO [facility] BECAUSE WHERE HE IS LIVING NOW THEY WANT WRAP HIS LEGS AS TO MY KNOWLEDGE HE HAS NO WOUNDS PLEASE ADVISE

## 2013-05-17 NOTE — Telephone Encounter (Signed)
He has venous insufficiency and needs compression dressings

## 2013-05-17 NOTE — Telephone Encounter (Signed)
He will probably need to be referred to the wound care clinic and have them help with the dressings.

## 2013-05-17 NOTE — Telephone Encounter (Signed)
Terri from [place] called stating that pt just got out of hospital and needs his legs wrapped but wants to know what kind of wrap to apply and how many wraps a week needs to be applied

## 2013-05-18 ENCOUNTER — Other Ambulatory Visit: Payer: Self-pay | Admitting: Internal Medicine

## 2013-05-18 NOTE — Telephone Encounter (Signed)
CALLED AND LEFT MESSAGE FOR TERRI LEFT WORD FOR WORD

## 2013-06-07 ENCOUNTER — Ambulatory Visit (INDEPENDENT_AMBULATORY_CARE_PROVIDER_SITE_OTHER): Payer: Medicare Other | Admitting: Family Medicine

## 2013-06-07 ENCOUNTER — Encounter: Payer: Self-pay | Admitting: Family Medicine

## 2013-06-07 VITALS — BP 150/80 | HR 74 | Wt 254.0 lb

## 2013-06-07 DIAGNOSIS — E785 Hyperlipidemia, unspecified: Secondary | ICD-10-CM

## 2013-06-07 DIAGNOSIS — E119 Type 2 diabetes mellitus without complications: Secondary | ICD-10-CM

## 2013-06-07 DIAGNOSIS — I890 Lymphedema, not elsewhere classified: Secondary | ICD-10-CM

## 2013-06-07 DIAGNOSIS — Z9119 Patient's noncompliance with other medical treatment and regimen: Secondary | ICD-10-CM

## 2013-06-07 DIAGNOSIS — Z23 Encounter for immunization: Secondary | ICD-10-CM

## 2013-06-07 DIAGNOSIS — I10 Essential (primary) hypertension: Secondary | ICD-10-CM

## 2013-06-07 NOTE — Patient Instructions (Signed)
Using the wrap so on the legs for the next several weeks. Take your medications the way they're prescribed not away you think you need to take them.

## 2013-06-07 NOTE — Progress Notes (Signed)
  Subjective:    Patient ID: [name], male    DOB: 1934-05-05, 79 y.o.   MRN: [id number]  HPI He is here for followup. It is very difficult to get him to stay on task and find out exactly why he is here. He states that they're not giving his medications as directed however correspondence from him indicates that he refused to take his medication. He is getting his legs wrapped. He states that doing better. He states he wants to go home but then mentions that he is very weak. His medications were reviewed.   Review of Systems     Objective:   Physical Exam Alert and in no distress. Both legs were wrapped however palpation shows much less edema and no pain. There is some slight edema both of his feet.       Assessment & Plan:  Chronic acquired lymphedema  Personal history of noncompliance with medical treatment, presenting hazards to health  Diabetes mellitus  Hypertension  Hyperlipemia  Need for prophylactic vaccination and inoculation against influenza - Plan: Flu vaccine HIGH DOSE PF (Fluzone Tri High dose)  it is very difficult to take care of him because he cannot stay on task and give a coherent straight answer. I again reiterated the fact that he needs to take medications according to our directions not his. He is to continue his legs wrapped.

## 2013-06-10 ENCOUNTER — Telehealth: Payer: Self-pay | Admitting: Family Medicine

## 2013-06-14 ENCOUNTER — Telehealth: Payer: Self-pay | Admitting: Family Medicine

## 2013-06-15 NOTE — Telephone Encounter (Signed)
fyi

## 2013-06-22 NOTE — Telephone Encounter (Signed)
LM

## 2013-07-21 ENCOUNTER — Emergency Department (HOSPITAL_COMMUNITY): Payer: Medicare Other

## 2013-07-21 ENCOUNTER — Encounter (HOSPITAL_COMMUNITY): Payer: Self-pay | Admitting: Emergency Medicine

## 2013-07-21 ENCOUNTER — Inpatient Hospital Stay (HOSPITAL_COMMUNITY)
Admission: EM | Admit: 2013-07-21 | Discharge: 2013-07-25 | DRG: 682 | Disposition: A | Discharge status: Home Health | Payer: Medicare Other | Attending: Internal Medicine | Admitting: Internal Medicine

## 2013-07-21 DIAGNOSIS — E1142 Type 2 diabetes mellitus with diabetic polyneuropathy: Secondary | ICD-10-CM | POA: Diagnosis present

## 2013-07-21 DIAGNOSIS — I509 Heart failure, unspecified: Secondary | ICD-10-CM | POA: Diagnosis present

## 2013-07-21 DIAGNOSIS — E785 Hyperlipidemia, unspecified: Secondary | ICD-10-CM | POA: Diagnosis present

## 2013-07-21 DIAGNOSIS — I440 Atrioventricular block, first degree: Secondary | ICD-10-CM | POA: Diagnosis present

## 2013-07-21 DIAGNOSIS — Z91199 Patient's noncompliance with other medical treatment and regimen due to unspecified reason: Secondary | ICD-10-CM

## 2013-07-21 DIAGNOSIS — E119 Type 2 diabetes mellitus without complications: Secondary | ICD-10-CM

## 2013-07-21 DIAGNOSIS — I4891 Unspecified atrial fibrillation: Secondary | ICD-10-CM

## 2013-07-21 DIAGNOSIS — I2510 Atherosclerotic heart disease of native coronary artery without angina pectoris: Secondary | ICD-10-CM | POA: Diagnosis present

## 2013-07-21 DIAGNOSIS — I5022 Chronic systolic (congestive) heart failure: Secondary | ICD-10-CM

## 2013-07-21 DIAGNOSIS — R600 Localized edema: Secondary | ICD-10-CM

## 2013-07-21 DIAGNOSIS — I319 Disease of pericardium, unspecified: Secondary | ICD-10-CM

## 2013-07-21 DIAGNOSIS — R609 Edema, unspecified: Secondary | ICD-10-CM

## 2013-07-21 DIAGNOSIS — E1149 Type 2 diabetes mellitus with other diabetic neurological complication: Secondary | ICD-10-CM | POA: Diagnosis present

## 2013-07-21 DIAGNOSIS — I2589 Other forms of chronic ischemic heart disease: Secondary | ICD-10-CM | POA: Diagnosis present

## 2013-07-21 DIAGNOSIS — Z79899 Other long term (current) drug therapy: Secondary | ICD-10-CM

## 2013-07-21 DIAGNOSIS — N183 Chronic kidney disease, stage 3 unspecified: Secondary | ICD-10-CM

## 2013-07-21 DIAGNOSIS — I129 Hypertensive chronic kidney disease with stage 1 through stage 4 chronic kidney disease, or unspecified chronic kidney disease: Principal | ICD-10-CM | POA: Diagnosis present

## 2013-07-21 DIAGNOSIS — M129 Arthropathy, unspecified: Secondary | ICD-10-CM | POA: Diagnosis present

## 2013-07-21 DIAGNOSIS — I160 Hypertensive urgency: Secondary | ICD-10-CM

## 2013-07-21 DIAGNOSIS — I5033 Acute on chronic diastolic (congestive) heart failure: Secondary | ICD-10-CM

## 2013-07-21 DIAGNOSIS — I10 Essential (primary) hypertension: Secondary | ICD-10-CM

## 2013-07-21 DIAGNOSIS — I5043 Acute on chronic combined systolic (congestive) and diastolic (congestive) heart failure: Secondary | ICD-10-CM | POA: Diagnosis present

## 2013-07-21 DIAGNOSIS — Z87891 Personal history of nicotine dependence: Secondary | ICD-10-CM

## 2013-07-21 DIAGNOSIS — I890 Lymphedema, not elsewhere classified: Secondary | ICD-10-CM | POA: Diagnosis present

## 2013-07-21 DIAGNOSIS — I252 Old myocardial infarction: Secondary | ICD-10-CM

## 2013-07-21 DIAGNOSIS — Z9119 Patient's noncompliance with other medical treatment and regimen: Secondary | ICD-10-CM

## 2013-07-21 DIAGNOSIS — K5900 Constipation, unspecified: Secondary | ICD-10-CM | POA: Diagnosis present

## 2013-07-21 DIAGNOSIS — E669 Obesity, unspecified: Secondary | ICD-10-CM | POA: Diagnosis present

## 2013-07-21 LAB — CBC WITH DIFFERENTIAL/PLATELET
Basophils Absolute: 0 10*3/uL (ref 0.0–0.1)
Basophils Relative: 0 % (ref 0–1)
Eosinophils Absolute: 0 10*3/uL (ref 0.0–0.7)
Eosinophils Relative: 1 % (ref 0–5)
HCT: 31.1 % — ABNORMAL LOW (ref 39.0–52.0)
MCH: 28.7 pg (ref 26.0–34.0)
MCHC: 33.1 g/dL (ref 30.0–36.0)
Monocytes Absolute: 0.8 10*3/uL (ref 0.1–1.0)
Monocytes Relative: 25 % — ABNORMAL HIGH (ref 3–12)
Neutro Abs: 1.3 10*3/uL — ABNORMAL LOW (ref 1.7–7.7)
Platelets: 203 10*3/uL (ref 150–400)
RDW: 13.5 % (ref 11.5–15.5)

## 2013-07-21 LAB — URINALYSIS, ROUTINE W REFLEX MICROSCOPIC
Bilirubin Urine: NEGATIVE
Hgb urine dipstick: NEGATIVE
Ketones, ur: NEGATIVE mg/dL
Protein, ur: NEGATIVE mg/dL
Urobilinogen, UA: 0.2 mg/dL (ref 0.0–1.0)

## 2013-07-21 LAB — GLUCOSE, CAPILLARY
Glucose-Capillary: 141 mg/dL — ABNORMAL HIGH (ref 70–99)
Glucose-Capillary: 183 mg/dL — ABNORMAL HIGH (ref 70–99)

## 2013-07-21 LAB — POCT I-STAT, CHEM 8
BUN: 24 mg/dL — ABNORMAL HIGH (ref 6–23)
Calcium, Ion: 1.24 mmol/L (ref 1.13–1.30)
Creatinine, Ser: 1.9 mg/dL — ABNORMAL HIGH (ref 0.50–1.35)
Glucose, Bld: 141 mg/dL — ABNORMAL HIGH (ref 70–99)
Hemoglobin: 10.2 g/dL — ABNORMAL LOW (ref 13.0–17.0)
TCO2: 25 mmol/L (ref 0–100)

## 2013-07-21 LAB — PRO B NATRIURETIC PEPTIDE: Pro B Natriuretic peptide (BNP): 1319 pg/mL — ABNORMAL HIGH (ref 0–450)

## 2013-07-21 MED ORDER — ACETAMINOPHEN 325 MG PO TABS: 650.0000 mg | ORAL_TABLET | Freq: Four times a day (QID) | ORAL | Status: DC | PRN

## 2013-07-21 MED ORDER — ONDANSETRON HCL 4 MG/2ML IJ SOLN: 4.0000 mg | Freq: Four times a day (QID) | INTRAMUSCULAR | Status: DC | PRN

## 2013-07-21 MED ORDER — INSULIN ASPART 100 UNIT/ML ~~LOC~~ SOLN
0.0000 [IU] | Freq: Three times a day (TID) | SUBCUTANEOUS | Status: DC
Start: 2013-07-21 — End: 2013-07-25
  Administered 2013-07-21: 12:00:00 1 [IU] via SUBCUTANEOUS
  Administered 2013-07-22: 12:00:00 3 [IU] via SUBCUTANEOUS
  Administered 2013-07-22: 17:00:00 2 [IU] via SUBCUTANEOUS

## 2013-07-21 MED ORDER — ALUM & MAG HYDROXIDE-SIMETH 200-200-20 MG/5ML PO SUSP: 30.0000 mL | Freq: Four times a day (QID) | ORAL | Status: DC | PRN

## 2013-07-21 MED ORDER — SODIUM CHLORIDE 0.9 % IJ SOLN
3.0000 mL | Freq: Two times a day (BID) | INTRAMUSCULAR | Status: DC
  Administered 2013-07-21 – 2013-07-24 (×4): 3 mL via INTRAVENOUS

## 2013-07-21 MED ORDER — ONDANSETRON HCL 4 MG PO TABS: 4.0000 mg | ORAL_TABLET | Freq: Four times a day (QID) | ORAL | Status: DC | PRN

## 2013-07-21 MED ORDER — HEPARIN SODIUM (PORCINE) 5000 UNIT/ML IJ SOLN
5000.0000 [IU] | Freq: Three times a day (TID) | INTRAMUSCULAR | Status: DC
  Administered 2013-07-21 – 2013-07-24 (×4): 5000 [IU] via SUBCUTANEOUS
  Filled 2013-07-21 (×15): qty 1

## 2013-07-21 MED ORDER — HYDROCERIN EX CREA
1.0000 "application " | TOPICAL_CREAM | Freq: Every morning | CUTANEOUS | Status: DC
  Administered 2013-07-22 – 2013-07-24 (×2): 1 via TOPICAL
  Filled 2013-07-21: qty 113

## 2013-07-21 MED ORDER — HYDRALAZINE HCL 25 MG PO TABS
12.5000 mg | ORAL_TABLET | Freq: Three times a day (TID) | ORAL | Status: DC
  Administered 2013-07-21 – 2013-07-24 (×6): 12.5 mg via ORAL
  Filled 2013-07-21 (×15): qty 0.5

## 2013-07-21 MED ORDER — TERAZOSIN HCL 5 MG PO CAPS
5.0000 mg | ORAL_CAPSULE | Freq: Every morning | ORAL | Status: DC
  Administered 2013-07-21 – 2013-07-24 (×3): 5 mg via ORAL
  Filled 2013-07-21 (×5): qty 1

## 2013-07-21 MED ORDER — HYDRALAZINE HCL 20 MG/ML IJ SOLN
5.0000 mg | Freq: Once | INTRAMUSCULAR | Status: AC
  Administered 2013-07-21: 5 mg via INTRAVENOUS
  Filled 2013-07-21: qty 1

## 2013-07-21 MED ORDER — LABETALOL HCL 200 MG PO TABS
200.0000 mg | ORAL_TABLET | Freq: Once | ORAL | Status: AC
  Administered 2013-07-21: 200 mg via ORAL
  Filled 2013-07-21: qty 1

## 2013-07-21 MED ORDER — PANTOPRAZOLE SODIUM 40 MG PO TBEC
40.0000 mg | DELAYED_RELEASE_TABLET | Freq: Every day | ORAL | Status: DC
  Administered 2013-07-21 – 2013-07-24 (×3): 40 mg via ORAL
  Filled 2013-07-21 (×3): qty 1

## 2013-07-21 MED ORDER — LABETALOL HCL 200 MG PO TABS
200.0000 mg | ORAL_TABLET | Freq: Two times a day (BID) | ORAL | Status: DC
  Administered 2013-07-21 – 2013-07-24 (×5): 200 mg via ORAL
  Filled 2013-07-21 (×10): qty 1

## 2013-07-21 MED ORDER — LISINOPRIL 10 MG PO TABS
10.0000 mg | ORAL_TABLET | Freq: Once | ORAL | Status: AC
  Administered 2013-07-21: 10 mg via ORAL
  Filled 2013-07-21: qty 1

## 2013-07-21 MED ORDER — FUROSEMIDE 20 MG PO TABS
40.0000 mg | ORAL_TABLET | Freq: Once | ORAL | Status: AC
  Administered 2013-07-21: 40 mg via ORAL
  Filled 2013-07-21: qty 2

## 2013-07-21 MED ORDER — FERROUS SULFATE 325 (65 FE) MG PO TABS
325.0000 mg | ORAL_TABLET | Freq: Two times a day (BID) | ORAL | Status: DC
  Administered 2013-07-21 – 2013-07-24 (×5): 325 mg via ORAL
  Filled 2013-07-21 (×10): qty 1

## 2013-07-21 MED ORDER — LISINOPRIL 10 MG PO TABS
10.0000 mg | ORAL_TABLET | Freq: Every morning | ORAL | Status: DC
  Administered 2013-07-21 – 2013-07-24 (×3): 10 mg via ORAL
  Filled 2013-07-21 (×5): qty 1

## 2013-07-21 MED ORDER — ACETAMINOPHEN 650 MG RE SUPP: 650.0000 mg | Freq: Four times a day (QID) | RECTAL | Status: DC | PRN

## 2013-07-21 MED ORDER — FUROSEMIDE 10 MG/ML IJ SOLN
40.0000 mg | Freq: Three times a day (TID) | INTRAMUSCULAR | Status: DC
  Administered 2013-07-21 – 2013-07-24 (×5): 40 mg via INTRAVENOUS
  Filled 2013-07-21 (×16): qty 4

## 2013-07-21 MED ORDER — HYDROCODONE-ACETAMINOPHEN 5-325 MG PO TABS
1.0000 | ORAL_TABLET | ORAL | Status: DC | PRN
Start: 2013-07-21 — End: 2013-07-25

## 2013-07-21 MED ORDER — MORPHINE SULFATE 2 MG/ML IJ SOLN: 1.0000 mg | INTRAMUSCULAR | Status: DC | PRN

## 2013-07-21 MED ORDER — SIMVASTATIN 20 MG PO TABS
20.0000 mg | ORAL_TABLET | Freq: Every day | ORAL | Status: DC
  Administered 2013-07-22 – 2013-07-24 (×2): 20 mg via ORAL
  Filled 2013-07-21 (×5): qty 1

## 2013-07-21 MED ORDER — HYDRALAZINE HCL 20 MG/ML IJ SOLN
5.0000 mg | Freq: Four times a day (QID) | INTRAMUSCULAR | Status: DC | PRN
  Administered 2013-07-22 – 2013-07-23 (×2): 5 mg via INTRAVENOUS
  Filled 2013-07-21 (×2): qty 1

## 2013-07-21 NOTE — ED Notes (Signed)
Patient transported to X-ray 

## 2013-07-21 NOTE — Progress Notes (Signed)
Pt asked where he was from, pt states he is homeless and slept at his daughter's couch last night. While assessing pt, I asked what day it was. He stated Thursday. I told pt today was Wednesday. He then asked when thanksgiving was and I told him tomorrow. He was confused, thinking today was Thursday and stated that  he came for thanksgiving meal.

## 2013-07-21 NOTE — H&P (Signed)
[facility] History and Physical  [name] ZOX:[id number] DOB: 1934-01-08 DOA: 07/21/2013  Referring physician: Izola Price. Marisue Humble, PA-C PCP: Carollee Herter, MD  Specialists:   Chief Complaint: Accelerated hypertension  HPI: [name] is a 79 y.o. male with past medical history of hypertension, diabetes mellitus and stage III CKD came in to the hospital because of worsening of his lower extremity swelling. Patient said he has chronic lower extremity swelling for the past 2 years, this is worsens and improves depending on his body fluid status. For the past several days it was worsening, his blood pressure also was worsening. He did not take his blood pressure medication for the past few days. Overall the patient is a very poor historian, cannot stay focused in one subject. He was in assisted living facility recently but for the past 2 months he was staying with his daughter. In the ED his blood pressure was found to be 200/64, he has +3 bilateral lower extremity edema and creatinine of 1.9. Patient will be admitted to the hospital for the accelerated hypertension.  Review of Systems:  Constitutional: negative for anorexia, fevers and sweats Eyes: negative for irritation, redness and visual disturbance Ears, nose, mouth, throat, and face: negative for earaches, epistaxis, nasal congestion and sore throat Respiratory: negative for cough, dyspnea on exertion, sputum and wheezing Cardiovascular: negative for chest pain, dyspnea, lower extremity edema, orthopnea, palpitations and syncope Gastrointestinal: negative for abdominal pain, constipation, diarrhea, melena, nausea and vomiting Genitourinary:negative for dysuria, frequency and hematuria Hematologic/lymphatic: negative for bleeding, easy bruising and lymphadenopathy Musculoskeletal:negative for arthralgias, muscle weakness and stiff joints Neurological: negative for coordination problems, gait problems, headaches  and weakness Endocrine: negative for diabetic symptoms including polydipsia, polyuria and weight loss Allergic/Immunologic: negative for anaphylaxis, hay fever and urticaria  Past Medical History  Diagnosis Date  . Smoker   . Narcolepsy   . Hypertension   . BPH (benign prostatic hyperplasia)   . Arthritis   . Diabetes mellitus   . Neuropathy in diabetes   . Herpes   . Dyslipidemia   . Obesity   . Lymphedema     LE lymphedemia with hx of cellulitis.  . CKD (chronic kidney disease)   . Anemia   . History of GI bleed 06/2012    EGD unremarkable;  Colo with large cecal polyp (bx benign)  . Ischemic cardiomyopathy     Echo 06/19/12: EF 40-45%, mid to distal anteroseptal HK, moderate LAE, PASP 37  . Chronic systolic heart failure   . CAD (coronary artery disease)     a. probable underlying CAD;  b. Type 2 NSTEMi in setting of profound anemia (Hgb 3.6) from GI bleed in 06/2012 and WMA on echo;  c. med Rx due to CKD and GI bleed  . CHF (congestive heart failure)   . Atrial fibrillation    Past Surgical History  Procedure Laterality Date  . Leg surgery      Left leg surgery. broken leg  . Esophagogastroduodenoscopy  06/19/2012    Procedure: ESOPHAGOGASTRODUODENOSCOPY (EGD);  Surgeon: Theda Belfast, MD;  Location: [facility] ENDOSCOPY;  Service: Endoscopy;  Laterality: N/A;  . Colonoscopy  06/21/2012    Procedure: COLONOSCOPY;  Surgeon: Charna Elizabeth, MD;  Location: [facility] ENDOSCOPY;  Service: Endoscopy;  Laterality: N/A;   Social History:  reports that he quit smoking about 14 years ago. He has never used smokeless tobacco. His alcohol and drug histories are not on file.  No Known Allergies  History reviewed. No pertinent family  history.   Prior to Admission medications   Medication Sig Start Date End Date Taking? Authorizing Provider  ferrous sulfate 325 (65 FE) MG tablet Take 325 mg by mouth 2 (two) times daily.   Yes Historical Provider, MD  furosemide (LASIX) 40 MG tablet TAKE 1 TABLET  BY MOUTH TWICE A DAY 05/18/13  Yes Ronnald Nian, MD  glipiZIDE (GLUCOTROL) 5 MG tablet Take 5 mg by mouth 2 (two) times daily before a meal.   Yes Historical Provider, MD  hydrALAZINE (APRESOLINE) 25 MG tablet Take 12.5 mg by mouth 3 (three) times daily.   Yes Historical Provider, MD  hydrocerin (EUCERIN) CREA Apply 1 application topically every morning. Apply to lower extremities   Yes Historical Provider, MD  labetalol (NORMODYNE) 200 MG tablet Take 200 mg by mouth 2 (two) times daily. 07/29/12  Yes Scott T Alben Spittle, PA-C  lisinopril (PRINIVIL,ZESTRIL) 10 MG tablet Take 10 mg by mouth every morning.   Yes Historical Provider, MD  omeprazole (PRILOSEC) 20 MG capsule Take 40 mg by mouth every morning.    Yes Historical Provider, MD  Polyethyl Glycol-Propyl Glycol (SYSTANE) 0.4-0.3 % SOLN Place 1 drop into both eyes 3 (three) times daily.   Yes Historical Provider, MD  pravastatin (PRAVACHOL) 10 MG tablet Take 10 mg by mouth every evening.   Yes Historical Provider, MD  terazosin (HYTRIN) 5 MG capsule Take 5 mg by mouth every morning.  03/30/12 07/21/13 Yes Ronnald Nian, MD   Physical Exam: Filed Vitals:   07/21/13 1105  BP: 181/70  Pulse: 68  Temp: 98 F (36.7 C)  Resp: 18   General appearance: alert, cooperative and no distress  Head: Normocephalic, without obvious abnormality, atraumatic  Eyes: conjunctivae/corneas clear. PERRL, EOM's intact. Fundi benign.  Nose: Nares normal. Septum midline. Mucosa normal. No drainage or sinus tenderness.  Throat: lips, mucosa, and tongue normal; teeth and gums normal  Neck: Supple, no masses, no cervical lymphadenopathy, no JVD appreciated, no meningeal signs Resp: clear to auscultation bilaterally  Chest wall: no tenderness  Cardio: regular rate and rhythm, S1, S2 normal, no murmur, click, rub or gallop  GI: soft, non-tender; bowel sounds normal; no masses, no organomegaly  Extremities: extremities normal, atraumatic, no cyanosis or edema  Skin:  Skin color, texture, turgor normal. No rashes or lesions  Neurologic: Alert and oriented X 3, normal strength and tone. Normal symmetric reflexes. Normal coordination and gait  Labs on Admission:  Basic Metabolic Panel:  Recent Labs Lab 07/21/13 0717  NA 142  K 4.2  CL 107  GLUCOSE 141*  BUN 24*  CREATININE 1.90*   Liver Function Tests: No results found for this basename: AST, ALT, ALKPHOS, BILITOT, PROT, ALBUMIN,  in the last 168 hours No results found for this basename: LIPASE, AMYLASE,  in the last 168 hours No results found for this basename: AMMONIA,  in the last 168 hours CBC:  Recent Labs Lab 07/21/13 0640 07/21/13 0717  WBC 3.0*  --   NEUTROABS 1.3*  --   HGB 10.3* 10.2*  HCT 31.1* 30.0*  MCV 86.6  --   PLT 203  --    Cardiac Enzymes: No results found for this basename: CKTOTAL, CKMB, CKMBINDEX, TROPONINI,  in the last 168 hours  BNP (last 3 results)  Recent Labs  05/03/13 2158 07/21/13 0640  PROBNP 1064.0* 1319.0*   CBG:  Recent Labs Lab 07/21/13 0649  GLUCAP 129*    Radiological Exams on Admission: Dg Chest 2 View  07/21/2013   CLINICAL DATA:  Bilateral leg swelling.  EXAM: CHEST  2 VIEW  COMPARISON:  05/03/2013  FINDINGS: The cardiac silhouette remains mildly enlarged, unchanged. There is mild central pulmonary vascular congestion without evidence of overt edema. No airspace consolidation, pleural effusion, or pneumothorax is identified. There is mild lower thoracic dextroscoliosis. Multilevel disc space narrowing and mild osteophytosis are noted in the thoracic spine.  IMPRESSION: Mild central pulmonary vascular congestion. No evidence of pulmonary edema or pneumonia.   Electronically Signed   By: Sebastian Ache   On: 07/21/2013 07:36    EKG: Independently reviewed.   Assessment/Plan Principal Problem:   Hypertensive urgency Active Problems:   Diabetes mellitus   Hypertension   Chronic acquired lymphedema   Chronic systolic heart failure    Peripheral edema   CKD (chronic kidney disease), stage III    Hypertensive urgency -Likely secondary to noncompliance to medications, he mentioned he did not take his medication for the past few days. -Home medication restarted, blood pressure ready 1 down to 170 systolic. -Hydralazine IV as needed for systolic blood pressure more than 160.  Bilateral lower extremity edema -Chronic lower extremity edema with both chronic diastolic CHF and CKD. -Patient started on aggressive diuresis with Lasix. -Restrict fluids to 1200 mL per day, 2 g of sodium, elevate lower extremities.  Diabetes mellitus -Patient is on glipizide, check hemoglobin A1c. -Diabetic diet, insulin sliding scale while he is in the hospital.  Chronic systolic CHF -Last 2-D echo from 06/19/2012 showed LVEF of 40-45%. -Repeat echocardiogram, patient will be on Lasix and lisinopril. -Beta blockers his be avoided because of first degree AV block.  CKD stage III -Baseline creatinine about 1.67 from September of 2014, creatinine now is 1.9. -CKD is likely secondary to hypertension and diabetes. Patient is on lisinopril.  Code Status: Full code Family Communication: Plan discussed with the patient Disposition Plan: Telemetry, inpatient, anticipate length of stay to be greater than 2 midnights  Time spent: 70 minutes  [facility] Pager [phone]  If 7PM-7AM, please contact night-coverage [email or web address] Password [facility] 07/21/2013, 11:47 AM

## 2013-07-21 NOTE — Evaluation (Signed)
Occupational Therapy Evaluation Patient Details Name: [name] MRN: [id number] DOB: 1934-06-17 Today's Date: 07/21/2013 Time: 8295-6213 OT Time Calculation (min): 18 min  OT Assessment / Plan / Recommendation History of present illness [name] is a 79 y.o. male with past medical history of hypertension, diabetes mellitus and stage III CKD came in to the hospital because of worsening of his lower extremity swelling. Patient said he has chronic lower extremity swelling for the past 2 years, this is worsens and improves depending on his body fluid status. For the past several days it was worsening, his blood pressure also was worsening. He did not take his blood pressure medication for the past few days. Overall the patient is a very poor historian, cannot stay focused in one subject. He was in assisted living facility recently but for the past 2 months he was staying with his daughter.   Clinical Impression   Pt admitted with the above.  He presents to OT with generalized weakness, cognitive deficits including poor safety awareness resulting in decreased independence with BADLs.  Currently, he requires min - mod A with LB ADLs.  He tells therapist that he is essentially homeless, that he discharged himself from [facility] ~ 2mos. Ago.  Pt. Reports he has lived with dtr, sleeping on her couch since that time, and returning home with her is not an options.  He wants to go somewhere where he can live by himself, take care of himself,  cook his own meals, and eat anything he wants.  He adamantly refuses any further OT (does not want any therapy) as he does not feel he needs anything further "I can do everything for myself".  The recommendation is for 24 hour assist at discharge, and will benefit from ALF, but he likely will refuse.     OT Assessment  Patient does not need any further OT services (due to pt refusal of further intervention)    Follow Up Recommendations  No OT follow  up    Barriers to Discharge Decreased caregiver support    Equipment Recommendations  None recommended by OT    Recommendations for Other Services    Frequency       Precautions / Restrictions Precautions Precautions: Fall Restrictions Weight Bearing Restrictions: No   Pertinent Vitals/Pain     ADL  Eating/Feeding: Independent Where Assessed - Eating/Feeding: Edge of bed Grooming: Wash/dry hands;Wash/dry face;Supervision/safety Where Assessed - Grooming: Unsupported standing Upper Body Bathing: Set up;Supervision/safety Where Assessed - Upper Body Bathing: Unsupported sitting Lower Body Bathing: Moderate assistance Where Assessed - Lower Body Bathing: Unsupported sit to stand Upper Body Dressing: Supervision/safety;Set up Where Assessed - Upper Body Dressing: Unsupported sitting Lower Body Dressing: Moderate assistance Where Assessed - Lower Body Dressing: Unsupported sit to stand Toilet Transfer: Min Pension scheme manager Method: Sit to Barista: Comfort height toilet Toileting - Clothing Manipulation and Hygiene: Min guard Where Assessed - Toileting Clothing Manipulation and Hygiene: Standing Transfers/Ambulation Related to ADLs: Pt ambulates in room with supervision.   ADL Comments: Pt unable to access feet.  Requires max prompting for minimal participation    OT Diagnosis: Generalized weakness;Cognitive deficits  OT Problem List: Decreased strength;Impaired balance (sitting and/or standing);Decreased safety awareness;Obesity OT Treatment Interventions:     OT Goals(Current goals can be found in the care plan section) Acute Rehab OT Goals Patient Stated Goal: "To get this swelling out of my legs, and leave this place as soon as I can"  Visit Information  Last OT Received On: 07/21/13 History of Present Illness: [name] is a 79 y.o. male with past medical history of hypertension, diabetes mellitus and stage III CKD came in to the  hospital because of worsening of his lower extremity swelling. Patient said he has chronic lower extremity swelling for the past 2 years, this is worsens and improves depending on his body fluid status. For the past several days it was worsening, his blood pressure also was worsening. He did not take his blood pressure medication for the past few days. Overall the patient is a very poor historian, cannot stay focused in one subject. He was in assisted living facility recently but for the past 2 months he was staying with his daughter.       Prior Functioning     Home Living Family/patient expects to be discharged to:: Unsure Additional Comments: Pt reports he lived at [facility] for ~1 year before checking himself out states "that was the worst year of my life".  He reports that he has been staying in his dtr's apt. since that time and living on her couch.  He also states that returning home with her is not an option, and that "no one wants to help" Prior Function Level of Independence: Needs assistance Gait / Transfers Assistance Needed: Pt ambulated short distances without a walker, but he does have one if needed).  He reports he only walks short distances because "it's all I've ever done" ADL's / Homemaking Assistance Needed: Pt reports, and dtr confirmed, pt required assist donning/doffing socks, tying shoes, and washing feet.  Otherwise, he was able to perform BADLs without assistance Communication / Swallowing Assistance Needed: Pt talks very little.  Mostly grunts when spoken to.  When prompted he will answer questions minimally Communication Communication: No difficulties Dominant Hand: Right         Vision/Perception     Cognition  Cognition Arousal/Alertness: Awake/alert Behavior During Therapy: Flat affect Overall Cognitive Status: Difficult to assess Difficult to assess due to:  (Family not forth coming with info.  Pt provides limited info)    Extremity/Trunk  Assessment Upper Extremity Assessment Upper Extremity Assessment: Overall WFL for tasks assessed Lower Extremity Assessment Lower Extremity Assessment: Defer to PT evaluation Cervical / Trunk Assessment Cervical / Trunk Assessment: Normal     Mobility Bed Mobility Bed Mobility: Supine to Sit;Sitting - Scoot to Edge of Bed;Sit to Supine Supine to Sit: 4: Min assist;HOB elevated;With rails Sitting - Scoot to Edge of Bed: 5: Supervision Sit to Supine: 5: Supervision Details for Bed Mobility Assistance: Assist to lift shoulders from bed Transfers Transfers: Sit to Stand;Stand to Sit Sit to Stand: 4: Min guard Stand to Sit: 4: Min guard Details for Transfer Assistance: min guard assist for balance/safety     Exercise     Balance Balance Balance Assessed: Yes Static Sitting Balance Static Sitting - Balance Support: Feet supported Static Sitting - Level of Assistance: 5: Stand by assistance Static Standing Balance Static Standing - Balance Support: No upper extremity supported Static Standing - Level of Assistance: 5: Stand by assistance Dynamic Standing Balance Dynamic Standing - Balance Support: Left upper extremity supported Dynamic Standing - Level of Assistance: 5: Stand by assistance Dynamic Standing - Comments: Pt fatigues quickly needing to return to seated position   End of Session OT - End of Session Activity Tolerance: Other (comment) (Pt self limiting) Patient left: in bed;with call bell/phone within reach;with family/visitor present Nurse Communication: Mobility status  GO  Jeani Hawking M 07/21/2013, 2:39 PM

## 2013-07-21 NOTE — ED Notes (Signed)
PA at bedside.

## 2013-07-21 NOTE — Progress Notes (Addendum)
When offered his BP medication, Pt yelled "No, No, No". He seemed very upset. I calmed him down and pt went back to sleep. Will continue to monitor pt.

## 2013-07-21 NOTE — H&P (Signed)
Patient refuses to do history at this time .

## 2013-07-21 NOTE — Progress Notes (Signed)
Pt's meds sent home with his daughter.

## 2013-07-21 NOTE — Progress Notes (Signed)
Called environmental about pt needing a new mattress.

## 2013-07-21 NOTE — Progress Notes (Signed)
Called environmental about pt needed mattress.

## 2013-07-21 NOTE — Progress Notes (Signed)
[name] [id number]  Code Status: Full  Admission Data: 07/21/2013 11:40 AM  Attending Provider: Elmahi  QMV:HQIONGE,XBMW CHARLES, MD  Consults/ Treatment Team:    [name] is a 79 y.o. male patient admitted from ED awake, alert - oriented X 3 - no acute distress noted. VSS - Blood pressure 181/70, pulse 68, temperature 98 F (36.7 C), temperature source Oral, resp. rate 18, height 5\' 10"  (1.778 m), weight 111.131 kg (245 lb), SpO2 100.00%. no c/o shortness of breath, no c/o chest pain.  IV Fluids: IV in place, occlusive dsg intact without redness, IV cath forearm left, condition patent and no redness  none.  Allergies: No Known Allergies    Past Medical History  Diagnosis Date  . Smoker   . Narcolepsy   . Hypertension   . BPH (benign prostatic hyperplasia)   . Arthritis   . Diabetes mellitus   . Neuropathy in diabetes   . Herpes   . Dyslipidemia   . Obesity   . Lymphedema     LE lymphedemia with hx of cellulitis.  . CKD (chronic kidney disease)   . Anemia   . History of GI bleed 06/2012    EGD unremarkable;  Colo with large cecal polyp (bx benign)  . Ischemic cardiomyopathy     Echo 06/19/12: EF 40-45%, mid to distal anteroseptal HK, moderate LAE, PASP 37  . Chronic systolic heart failure   . CAD (coronary artery disease)     a. probable underlying CAD;  b. Type 2 NSTEMi in setting of profound anemia (Hgb 3.6) from GI bleed in 06/2012 and WMA on echo;  c. med Rx due to CKD and GI bleed  . CHF (congestive heart failure)   . Atrial fibrillation     Medications Prior to Admission  Medication Sig Dispense Refill  . ferrous sulfate 325 (65 FE) MG tablet Take 325 mg by mouth 2 (two) times daily.      . furosemide (LASIX) 40 MG tablet TAKE 1 TABLET BY MOUTH TWICE A DAY  60 tablet  5  . glipiZIDE (GLUCOTROL) 5 MG tablet Take 5 mg by mouth 2 (two) times daily before a meal.      . hydrALAZINE (APRESOLINE) 25 MG tablet Take 12.5 mg by mouth 3 (three) times daily.       . hydrocerin (EUCERIN) CREA Apply 1 application topically every morning. Apply to lower extremities      . labetalol (NORMODYNE) 200 MG tablet Take 200 mg by mouth 2 (two) times daily.      Marland Kitchen lisinopril (PRINIVIL,ZESTRIL) 10 MG tablet Take 10 mg by mouth every morning.      Marland Kitchen omeprazole (PRILOSEC) 20 MG capsule Take 40 mg by mouth every morning.       Bertram Gala Glycol-Propyl Glycol (SYSTANE) 0.4-0.3 % SOLN Place 1 drop into both eyes 3 (three) times daily.      . pravastatin (PRAVACHOL) 10 MG tablet Take 10 mg by mouth every evening.      . terazosin (HYTRIN) 5 MG capsule Take 5 mg by mouth every morning.        Orientation to room, and floor completed with information packet given to patient/family. Patient declined safety video at this time. Admission INP armband ID verified with patient/family, and in place.  SR up x 2, fall assessment complete, with patient and family able to verbalize understanding of risk associated with falls, and verbalized understanding to call nsg before up out of bed. Call light within  reach, patient able to voice, and demonstrate understanding. Skin, clean-dry- intact without evidence of bruising, or skin tears.  No evidence of skin break down noted on exam.  ?  Will cont to eval and treat per MD orders.  [name] Decant, [place]  07/21/2013 11:40 AM

## 2013-07-21 NOTE — ED Notes (Signed)
Reports bilat leg swelling x 2 weeks.  States he's been told he has CHF but he does not believe it. Denies dyspnea.

## 2013-07-21 NOTE — ED Provider Notes (Signed)
CSN: [id number]     Arrival date & time 07/21/13  0556 History   First MD Initiated Contact with Patient 07/21/13 [phone]     Chief Complaint  Patient presents with  . Leg Swelling   (Consider location/radiation/quality/duration/timing/severity/associated sxs/prior Treatment) HPI Comments: Patient here with complaints of two months of bilateral lower leg swelling (though he also admits to this being about 2 years in duration).  Patient is difficulty historian and difficult to pin down the reason he is here.  He reports that the swelling has been there for about 2 months.  He states that he was previously in a nursing home where they were wrapping the legs and using compression stockings on the legs which was helping.  He states that he no longer wanted to live in the nursing home so he signed himself out and he now sleeps on his daughter's couch.  He reports that the cause of all of his health concerns currently are because of Dr. Susann Givens.  He states that since going to him he has been declining in health.  He does not appear to coorelate his medical conditions with the need to treat them using medication.  He denies chest pain, shortness of breath, reports increasingly difficulty with walking because of the swelling.  Denies fever, chills.  Also noted to be hypertensive, which he reports is his "normal" blood pressure.  The history is provided by the patient and a relative. No language interpreter was used.    Past Medical History  Diagnosis Date  . Smoker   . Narcolepsy   . Hypertension   . BPH (benign prostatic hyperplasia)   . Arthritis   . Diabetes mellitus   . Neuropathy in diabetes   . Herpes   . Dyslipidemia   . Obesity   . Lymphedema     LE lymphedemia with hx of cellulitis.  . CKD (chronic kidney disease)   . Anemia   . History of GI bleed 06/2012    EGD unremarkable;  Colo with large cecal polyp (bx benign)  . Ischemic cardiomyopathy     Echo 06/19/12: EF 40-45%, mid to  distal anteroseptal HK, moderate LAE, PASP 37  . Chronic systolic heart failure   . CAD (coronary artery disease)     a. probable underlying CAD;  b. Type 2 NSTEMi in setting of profound anemia (Hgb 3.6) from GI bleed in 06/2012 and WMA on echo;  c. med Rx due to CKD and GI bleed  . CHF (congestive heart failure)   . Atrial fibrillation    Past Surgical History  Procedure Laterality Date  . Leg surgery      Left leg surgery. broken leg  . Esophagogastroduodenoscopy  06/19/2012    Procedure: ESOPHAGOGASTRODUODENOSCOPY (EGD);  Surgeon: Theda Belfast, MD;  Location[facility] ENDOSCOPY;  Service: Endoscopy;  Laterality: N/A;  . Colonoscopy  06/21/2012    Procedure: COLONOSCOPY;  Surgeon: Charna Elizabeth, MD;  Location: [facility] ENDOSCOPY;  Service: Endoscopy;  Laterality: N/A;   History reviewed. No pertinent family history. History  Substance Use Topics  . Smoking status: Former Smoker    Quit date: 08/26/1998  . Smokeless tobacco: Never Used  . Alcohol Use: Not on file    Review of Systems  Constitutional: Negative for fever and chills.  HENT: Negative for congestion.   Eyes: Negative for pain.  Respiratory: Negative for cough, chest tightness and shortness of breath.   Cardiovascular: Negative for chest pain and palpitations.  Gastrointestinal: Negative for abdominal  pain, diarrhea and constipation.  Genitourinary: Negative for flank pain.  Musculoskeletal: Positive for myalgias. Negative for arthralgias.    Allergies  Review of patient's allergies indicates no known allergies.  Home Medications   Current Outpatient Rx  Name  Route  Sig  Dispense  Refill  . ferrous sulfate 325 (65 FE) MG tablet   Oral   Take 325 mg by mouth 2 (two) times daily.         . furosemide (LASIX) 40 MG tablet      TAKE 1 TABLET BY MOUTH TWICE A DAY   60 tablet   5   . glipiZIDE (GLUCOTROL) 5 MG tablet   Oral   Take 5 mg by mouth 2 (two) times daily before a meal.         . hydrALAZINE  (APRESOLINE) 25 MG tablet   Oral   Take 12.5 mg by mouth 3 (three) times daily.         . hydrocerin (EUCERIN) CREA   Topical   Apply 1 application topically every morning. Apply to lower extremities         . labetalol (NORMODYNE) 200 MG tablet   Oral   Take 200 mg by mouth 2 (two) times daily.         Marland Kitchen lisinopril (PRINIVIL,ZESTRIL) 10 MG tablet   Oral   Take 10 mg by mouth every morning.         Marland Kitchen omeprazole (PRILOSEC) 20 MG capsule   Oral   Take 40 mg by mouth every morning.          Bertram Gala Glycol-Propyl Glycol (SYSTANE) 0.4-0.3 % SOLN   Both Eyes   Place 1 drop into both eyes 3 (three) times daily.         . pravastatin (PRAVACHOL) 10 MG tablet   Oral   Take 10 mg by mouth every evening.         . terazosin (HYTRIN) 5 MG capsule   Oral   Take 5 mg by mouth every morning.           BP 192/78  Temp(Src) 98.1 F (36.7 C) (Oral)  Resp 15  Ht 5\' 10"  (1.778 m)  Wt 245 lb (111.131 kg)  BMI 35.15 kg/m2  SpO2 98% Physical Exam  Nursing note and vitals reviewed. Constitutional: He is oriented to person, place, and time. He appears well-developed and well-nourished. No distress.  hypertensive  HENT:  Head: Normocephalic and atraumatic.  Right Ear: External ear normal.  Left Ear: External ear normal.  Nose: Nose normal.  Mouth/Throat: Oropharynx is clear and moist. No oropharyngeal exudate.  Eyes: Conjunctivae are normal. Pupils are equal, round, and reactive to light. No scleral icterus.  Neck: Normal range of motion. Neck supple.  Cardiovascular: Normal rate, regular rhythm and normal heart sounds.  Exam reveals no gallop and no friction rub.   No murmur heard. Pulmonary/Chest: Effort normal and breath sounds normal. No respiratory distress. He has no wheezes. He has no rales. He exhibits no tenderness.  Abdominal: Soft. Bowel sounds are normal. He exhibits no distension. There is no tenderness. There is no rebound and no guarding.   Musculoskeletal: Normal range of motion. He exhibits edema. He exhibits no tenderness.  Lymphadenopathy:    He has no cervical adenopathy.  Neurological: He is alert and oriented to person, place, and time. He exhibits normal muscle tone. Coordination normal.  Skin: Skin is warm and dry. No rash noted. No erythema.  Chronic thickening of skin of bilateral lower exremities, non-pitting 3+ edema noted  Psychiatric: He has a normal mood and affect. His behavior is normal. Judgment and thought content normal.    ED Course  Procedures (including critical care time) Labs Review Labs Reviewed  CBC WITH DIFFERENTIAL - Abnormal; Notable for the following:    WBC 3.0 (*)    RBC 3.59 (*)    Hemoglobin 10.3 (*)    HCT 31.1 (*)    Neutro Abs 1.3 (*)    Monocytes Relative 25 (*)    All other components within normal limits  PRO B NATRIURETIC PEPTIDE - Abnormal; Notable for the following:    Pro B Natriuretic peptide (BNP) 1319.0 (*)    All other components within normal limits  GLUCOSE, CAPILLARY - Abnormal; Notable for the following:    Glucose-Capillary 129 (*)    All other components within normal limits  POCT I-STAT, CHEM 8 - Abnormal; Notable for the following:    BUN 24 (*)    Creatinine, Ser 1.90 (*)    Glucose, Bld 141 (*)    Hemoglobin 10.2 (*)    HCT 30.0 (*)    All other components within normal limits  POCT I-STAT TROPONIN I   Imaging Review Dg Chest 2 View  07/21/2013   CLINICAL DATA:  Bilateral leg swelling.  EXAM: CHEST  2 VIEW  COMPARISON:  05/03/2013  FINDINGS: The cardiac silhouette remains mildly enlarged, unchanged. There is mild central pulmonary vascular congestion without evidence of overt edema. No airspace consolidation, pleural effusion, or pneumothorax is identified. There is mild lower thoracic dextroscoliosis. Multilevel disc space narrowing and mild osteophytosis are noted in the thoracic spine.  IMPRESSION: Mild central pulmonary vascular congestion. No  evidence of pulmonary edema or pneumonia.   Electronically Signed   By: Sebastian Ache   On: 07/21/2013 07:36    EKG Interpretation    Date/Time:  Wednesday July 21 2013 06:08:41 EST Ventricular Rate:  74 PR Interval:  230 QRS Duration: 86 QT Interval:  412 QTC Calculation: 457 R Axis:   -10 Text Interpretation:  Sinus rhythm Prolonged PR interval Anteroseptal infarct, old Baseline wander in lead(s) V1 Confirmed by [facility]  MD, APRIL ([id number]) on 07/21/2013 7:14:48 AM           Results for orders placed during the hospital encounter of 07/21/13  CBC WITH DIFFERENTIAL      Result Value Range   WBC 3.0 (*) 4.0 - 10.5 K/uL   RBC 3.59 (*) 4.22 - 5.81 MIL/uL   Hemoglobin 10.3 (*) 13.0 - 17.0 g/dL   HCT 16.1 (*) 09.6 - 04.5 %   MCV 86.6  78.0 - 100.0 fL   MCH 28.7  26.0 - 34.0 pg   MCHC 33.1  30.0 - 36.0 g/dL   RDW 40.9  81.1 - 91.4 %   Platelets 203  150 - 400 K/uL   Neutrophils Relative % 44  43 - 77 %   Neutro Abs 1.3 (*) 1.7 - 7.7 K/uL   Lymphocytes Relative 29  12 - 46 %   Lymphs Abs 0.9  0.7 - 4.0 K/uL   Monocytes Relative 25 (*) 3 - 12 %   Monocytes Absolute 0.8  0.1 - 1.0 K/uL   Eosinophils Relative 1  0 - 5 %   Eosinophils Absolute 0.0  0.0 - 0.7 K/uL   Basophils Relative 0  0 - 1 %   Basophils Absolute 0.0  0.0 - 0.1 K/uL  PRO B NATRIURETIC PEPTIDE      Result Value Range   Pro B Natriuretic peptide (BNP) 1319.0 (*) 0 - 450 pg/mL  GLUCOSE, CAPILLARY      Result Value Range   Glucose-Capillary 129 (*) 70 - 99 mg/dL  POCT I-STAT, CHEM 8      Result Value Range   Sodium 142  135 - 145 mEq/L   Potassium 4.2  3.5 - 5.1 mEq/L   Chloride 107  96 - 112 mEq/L   BUN 24 (*) 6 - 23 mg/dL   Creatinine, Ser 4.54 (*) 0.50 - 1.35 mg/dL   Glucose, Bld 098 (*) 70 - 99 mg/dL   Calcium, Ion 1.19  1.47 - 1.30 mmol/L   TCO2 25  0 - 100 mmol/L   Hemoglobin 10.2 (*) 13.0 - 17.0 g/dL   HCT 82.9 (*) 56.2 - 13.0 %  POCT I-STAT TROPONIN I      Result Value Range   Troponin  i, poc 0.05  0.00 - 0.08 ng/mL   Comment 3            Dg Chest 2 View  07/21/2013   CLINICAL DATA:  Bilateral leg swelling.  EXAM: CHEST  2 VIEW  COMPARISON:  05/03/2013  FINDINGS: The cardiac silhouette remains mildly enlarged, unchanged. There is mild central pulmonary vascular congestion without evidence of overt edema. No airspace consolidation, pleural effusion, or pneumothorax is identified. There is mild lower thoracic dextroscoliosis. Multilevel disc space narrowing and mild osteophytosis are noted in the thoracic spine.  IMPRESSION: Mild central pulmonary vascular congestion. No evidence of pulmonary edema or pneumonia.   Electronically Signed   By: Sebastian Ache   On: 07/21/2013 07:36      MDM  Peripheral edema Hypertensive urgency  Patient with history of HTN, DM, lymphedema presents to the ED after checking himself out of the nursing home and now with medication non-compliance with worsening of his chronic symptoms.  He has no real insight into the cause of the disease process.  Will admit to [facility] for the hypertensive urgency and the peripheral edema.   Izola Price Marisue Humble, [place] 07/21/13 [id number]

## 2013-07-21 NOTE — Progress Notes (Signed)
Pt clearly stated upon shift assessment that he doesn't want any medication, only for his swollen legs. Pt educated but remained adamant. Will continue to monitor.

## 2013-07-21 NOTE — Progress Notes (Signed)
Pt refusing all meds. Pt states that all these meds are making him sicker. Refusing insulin as well.

## 2013-07-21 NOTE — Progress Notes (Signed)
  Echocardiogram 2D Echocardiogram has been performed.  ARNAV, CREGG 07/21/2013, 5:17 PM

## 2013-07-22 DIAGNOSIS — I4891 Unspecified atrial fibrillation: Secondary | ICD-10-CM

## 2013-07-22 LAB — BASIC METABOLIC PANEL
CO2: 27 mEq/L (ref 19–32)
Chloride: 105 mEq/L (ref 96–112)
Creatinine, Ser: 1.8 mg/dL — ABNORMAL HIGH (ref 0.50–1.35)
Glucose, Bld: 158 mg/dL — ABNORMAL HIGH (ref 70–99)
Potassium: 4 mEq/L (ref 3.5–5.1)
Sodium: 140 mEq/L (ref 135–145)

## 2013-07-22 LAB — CBC
Hemoglobin: 9.5 g/dL — ABNORMAL LOW (ref 13.0–17.0)
MCH: 29.1 pg (ref 26.0–34.0)
MCV: 86.9 fL (ref 78.0–100.0)
RBC: 3.27 MIL/uL — ABNORMAL LOW (ref 4.22–5.81)

## 2013-07-22 LAB — GLUCOSE, CAPILLARY: Glucose-Capillary: 154 mg/dL — ABNORMAL HIGH (ref 70–99)

## 2013-07-22 NOTE — Progress Notes (Signed)
[facility] PROGRESS NOTE  [name] WUJ:[id number] DOB: 06/29/1934 DOA: 07/21/2013 PCP: Carollee Herter, MD  HPI/Subjective: Feels better, denies any specific complaints. Refused his medications yesterday.  I spoke in length with the patient that if he is using medication he will be discharged.  Assessment/Plan: Principal Problem:   Hypertensive urgency Active Problems:   Diabetes mellitus   Hypertension   Chronic acquired lymphedema   Chronic systolic heart failure   Peripheral edema   CKD (chronic kidney disease), stage III   Accelerated hypertension -Likely secondary to noncompliance to medications, he mentioned he did not take his medication for the past few days.  -Home medication restarted, blood pressure ready 1 down to 170 systolic.  -Hydralazine IV as needed for systolic blood pressure more than 160.   Acute on chronic diastolic CHF  -Last 2-D echo from 06/19/2012 showed LVEF of 40-45%.  -Repeat echocardiogram, patient will be on Lasix and lisinopril.  -Beta blockers his be avoided because of first degree AV block.   Bilateral lower extremity edema  -Chronic lower extremity edema with both chronic diastolic CHF and CKD.  -Patient started on aggressive diuresis with Lasix.  -Restrict fluids to 1200 mL per day, 2 g of sodium, elevate lower extremities.   Diabetes mellitus  -Patient is on glipizide, check hemoglobin A1c.  -Diabetic diet, insulin sliding scale while he is in the hospital.   CKD stage III  -Baseline creatinine about 1.67 from September of 2014, creatinine now is 1.9.  -CKD is likely secondary to hypertension and diabetes. Patient is on lisinopril.  Medical noncompliance -Patient is notorious for his noncompliance to medical advice/medications. -Received a call from his PCP Dr. Susann Givens, confirming his noncompliance/paranoid behavior.  Code Status: Full code Family Communication: Plan discussed with the patient. Disposition  Plan: Remains inpatient   Consultants:  None  Procedures:  None  Antibiotics:  None   Objective: Filed Vitals:   07/22/13 0515  BP: 152/53  Pulse: 71  Temp: 98.6 F (37 C)  Resp: 18    Intake/Output Summary (Last 24 hours) at 07/22/13 1259 Last data filed at 07/22/13 1000  Gross per 24 hour  Intake    240 ml  Output    350 ml  Net   -110 ml   Filed Weights   07/21/13 0609  Weight: 111.131 kg (245 lb)    Exam: General: Alert and awake, oriented x3, not in any acute distress. HEENT: anicteric sclera, pupils reactive to light and accommodation, EOMI CVS: S1-S2 clear, no murmur rubs or gallops Chest: clear to auscultation bilaterally, no wheezing, rales or rhonchi Abdomen: soft nontender, nondistended, normal bowel sounds, no organomegaly Extremities: no cyanosis, clubbing or edema noted bilaterally Neuro: Cranial nerves II-XII intact, no focal neurological deficits  Data Reviewed: Basic Metabolic Panel:  Recent Labs Lab 07/21/13 0717 07/22/13 0341  NA 142 140  K 4.2 4.0  CL 107 105  CO2  --  27  GLUCOSE 141* 158*  BUN 24* 24*  CREATININE 1.90* 1.80*  CALCIUM  --  8.7   Liver Function Tests: No results found for this basename: AST, ALT, ALKPHOS, BILITOT, PROT, ALBUMIN,  in the last 168 hours No results found for this basename: LIPASE, AMYLASE,  in the last 168 hours No results found for this basename: AMMONIA,  in the last 168 hours CBC:  Recent Labs Lab 07/21/13 0640 07/21/13 0717 07/22/13 0341  WBC 3.0*  --  3.0*  NEUTROABS 1.3*  --   --   HGB  10.3* 10.2* 9.5*  HCT 31.1* 30.0* 28.4*  MCV 86.6  --  86.9  PLT 203  --  180   Cardiac Enzymes: No results found for this basename: CKTOTAL, CKMB, CKMBINDEX, TROPONINI,  in the last 168 hours BNP (last 3 results)  Recent Labs  05/03/13 2158 07/21/13 0640 07/22/13 0341  PROBNP 1064.0* 1319.0* 1758.0*   CBG:  Recent Labs Lab 07/21/13 1154 07/21/13 1747 07/21/13 2128 07/22/13 0754  07/22/13 1159  GLUCAP 141* 183* 212* 136* 238*    Micro No results found for this or any previous visit (from the past 240 hour(s)).   Studies: Dg Chest 2 View  07/21/2013   CLINICAL DATA:  Bilateral leg swelling.  EXAM: CHEST  2 VIEW  COMPARISON:  05/03/2013  FINDINGS: The cardiac silhouette remains mildly enlarged, unchanged. There is mild central pulmonary vascular congestion without evidence of overt edema. No airspace consolidation, pleural effusion, or pneumothorax is identified. There is mild lower thoracic dextroscoliosis. Multilevel disc space narrowing and mild osteophytosis are noted in the thoracic spine.  IMPRESSION: Mild central pulmonary vascular congestion. No evidence of pulmonary edema or pneumonia.   Electronically Signed   By: Sebastian Ache   On: 07/21/2013 07:36    Scheduled Meds: . ferrous sulfate  325 mg Oral BID  . furosemide  40 mg Intravenous Q8H  . heparin  5,000 Units Subcutaneous Q8H  . hydrALAZINE  12.5 mg Oral TID  . hydrocerin  1 application Topical q morning - 10a  . insulin aspart  0-9 Units Subcutaneous TID WC  . labetalol  200 mg Oral BID  . lisinopril  10 mg Oral q morning - 10a  . pantoprazole  40 mg Oral Daily  . simvastatin  20 mg Oral q1800  . sodium chloride  3 mL Intravenous Q12H  . terazosin  5 mg Oral q morning - 10a   Continuous Infusions:      Time spent: 35 minutes    [facility] A  [facility] Pager [phone] If 7PM-7AM, please contact night-coverage at [email or web address], password [facility] 07/22/2013, 12:59 PM  LOS: 1 day

## 2013-07-22 NOTE — Progress Notes (Signed)
Continues to refuse Tele box

## 2013-07-22 NOTE — Progress Notes (Signed)
Pt took off his tele box, educated and continues to refused. MD cover made aware and called to document pt refusal. CMD made aware. Will continue to monitor.

## 2013-07-22 NOTE — ED Provider Notes (Signed)
Medical screening examination/treatment/procedure(s) were conducted as a shared visit with non-physician practitioner(s) and myself.  I personally evaluated the patient during the encounter.  EKG Interpretation    Date/Time:  Wednesday July 21 2013 06:08:41 EST Ventricular Rate:  74 PR Interval:  230 QRS Duration: 86 QT Interval:  412 QTC Calculation: 457 R Axis:   -10 Text Interpretation:  Sinus rhythm Prolonged PR interval Anteroseptal infarct, old Baseline wander in lead(s) V1 Confirmed by [facility]  MD, Dovber Ernest ([id number]) on 07/21/2013 7:14:48 AM          Seen and examined in conjunction with Scarlette Calico RRR diminshed BS B NABS 3+ edema with stasis dermatitis AO3   Ravonda Brecheen Smitty Cords, MD 07/22/13 [phone]

## 2013-07-22 NOTE — Progress Notes (Signed)
Dr. Lucretia Roers came up and speak with the pt. Pt clearly stated that he doesn't want to take his blood pressure medication but only med for constipation. Awaiting for new order. Will continue to monitor.

## 2013-07-22 NOTE — Progress Notes (Signed)
Pt is starting to refuse all of his medication. Pt's b/p is 193/117, pulse is 112. 2 RN's have educated pt on the importance of taking his medication and pt still refuses. MD has been called and she has agreed to come up and see pt.

## 2013-07-23 ENCOUNTER — Inpatient Hospital Stay (HOSPITAL_COMMUNITY): Payer: Medicare Other

## 2013-07-23 DIAGNOSIS — I509 Heart failure, unspecified: Secondary | ICD-10-CM

## 2013-07-23 DIAGNOSIS — I5033 Acute on chronic diastolic (congestive) heart failure: Secondary | ICD-10-CM

## 2013-07-23 LAB — GLUCOSE, CAPILLARY
Glucose-Capillary: 168 mg/dL — ABNORMAL HIGH (ref 70–99)
Glucose-Capillary: 152 mg/dL — ABNORMAL HIGH (ref 70–99)
Glucose-Capillary: 143 mg/dL — ABNORMAL HIGH (ref 70–99)
Glucose-Capillary: 148 mg/dL — ABNORMAL HIGH (ref 70–99)

## 2013-07-23 LAB — BASIC METABOLIC PANEL
BUN: 22 mg/dL (ref 6–23)
CO2: 28 mEq/L (ref 19–32)
Chloride: 99 mEq/L (ref 96–112)
Creatinine, Ser: 1.64 mg/dL — ABNORMAL HIGH (ref 0.50–1.35)
GFR calc Af Amer: 45 mL/min — ABNORMAL LOW (ref >90)
Potassium: 4 mEq/L (ref 3.5–5.1)

## 2013-07-23 MED ORDER — DOCUSATE SODIUM 100 MG PO CAPS: 100.0000 mg | ORAL_CAPSULE | Freq: Two times a day (BID) | ORAL | Status: DC | PRN

## 2013-07-23 MED ORDER — POLYETHYLENE GLYCOL 3350 17 G PO PACK
17.0000 g | PACK | Freq: Every day | ORAL | Status: DC
  Administered 2013-07-23 – 2013-07-24 (×2): 17 g via ORAL
  Filled 2013-07-23 (×4): qty 1

## 2013-07-23 NOTE — Evaluation (Signed)
Physical Therapy Evaluation Patient Details Name: [name] MRN: [id number] DOB: [birth year] Today's Date: 07/23/2013 Time: 4098-1191 PT Time Calculation (min): 8 min  PT Assessment / Plan / Recommendation History of Present Illness  [name] is a 79 y.o. male with past medical history of hypertension, diabetes mellitus and stage III CKD came in to the hospital because of worsening of his lower extremity swelling. Patient said he has chronic lower extremity swelling for the past 2 years, this is worsens and improves depending on his body fluid status. For the past several days it was worsening, his blood pressure also was worsening. He did not take his blood pressure medication for the past few days. Overall the patient is a very poor historian, cannot stay focused in one subject. He was in assisted living facility recently but for the past 2 months he was staying with his daughter.  Clinical Impression  Pt. Presents to PT with no intent to participate past this brief initial visit.  He was willing to show this therapist that he would walk so "you won't bother me anymore".  He could benefit from acute PT to address mobility issues but he will not comply with this.  Per his chart, he is refusing much of his care.  Attempted to discuss with pt. That when he refuses aspects of his care, it is likely to have a negative impact on other areas of his health. He does not acknowledge this concept.  Will sign off at this time due to pt's unwillingness to cooperate and participate.  Would need and would benefit from  24 hour care in a supportive environment such as SNF but he is likely to refuse.      PT Assessment  Patient needs continued PT services (could benefit but pt. declines)    Follow Up Recommendations  No PT follow up (due to pt.'s unwillingness)    Does the patient have the potential to tolerate intense rehabilitation      Barriers to Discharge  (? homeless status  currently) unclear what DC dispostion will be    Equipment Recommendations  Other (comment) (pt. indicates he has walker)    Recommendations for Other Services     Frequency      Precautions / Restrictions Precautions Precautions: Fall Restrictions Weight Bearing Restrictions: No   Pertinent Vitals/Pain Reports pain in abdomen but does not rate.  He says it is from being "backed up".  NAD noted.        Mobility  Bed Mobility Bed Mobility: Supine to Sit;Sitting - Scoot to Edge of Bed Supine to Sit: 5: Supervision;HOB flat;With rails Sitting - Scoot to Edge of Bed: 5: Supervision Sit to Supine: Not Tested (comment) Details for Bed Mobility Assistance: used rail to assist self but no physical assist needed today form therapist Transfers Transfers: Sit to Stand;Stand to Sit Sit to Stand: 5: Supervision;From bed;With upper extremity assist Stand to Sit: 5: Supervision;To bed;With upper extremity assist Details for Transfer Assistance: supervision for safety Ambulation/Gait Ambulation/Gait Assistance: 5: Supervision Ambulation Distance (Feet): 20 Feet Assistive device: None Ambulation/Gait Assistance Details: pt. with increase in lateral shift for short distance ambulation in room, supervision for safety.  He refused to go any further with PT and said he did not want this therapist to bother him amymore.  I indicated to pt. that increaseing his mobility would have a positive impact on his constipation but he does not want this input.   Gait Pattern: Step-through pattern;Decreased step length - right;Decreased  step length - left;Lateral trunk lean to right;Lateral trunk lean to left Gait velocity: decreased Stairs: No    Exercises     PT Diagnosis: Difficulty walking;Abnormality of gait  PT Problem List: Decreased activity tolerance;Decreased balance;Decreased mobility;Decreased knowledge of precautions;Pain PT Treatment Interventions:       PT Goals(Current goals can be found  in the care plan section)    Visit Information  Last PT Received On: 07/23/13 Assistance Needed: +1 History of Present Illness: [name] is a 79 y.o. male with past medical history of hypertension, diabetes mellitus and stage III CKD came in to the hospital because of worsening of his lower extremity swelling. Patient said he has chronic lower extremity swelling for the past 2 years, this is worsens and improves depending on his body fluid status. For the past several days it was worsening, his blood pressure also was worsening. He did not take his blood pressure medication for the past few days. Overall the patient is a very poor historian, cannot stay focused in one subject. He was in assisted living facility recently but for the past 2 months he was staying with his daughter.       Prior Functioning  Home Living Family/patient expects to be discharged to:: Unsure Prior Function Level of Independence: Needs assistance Gait / Transfers Assistance Needed: Pt ambulated short distances without a walker, but he does have one if needed).  He reports he only walks short distances because "it's all I've ever done" Communication / Swallowing Assistance Needed: communicates needs  Comments: he complains about everything that is being "done wrong", inclunding focus on his BP .  He indicates " I came in here to get some help for the swelling in my legs but they are not doing that), Legs are noted to be wrapped with ace wraps.  When questioned, pt does not indicate that he sees any link between issues such as blood pressure being high and leg edema. Communication Communication: No difficulties    Cognition  Cognition Arousal/Alertness: Awake/alert Behavior During Therapy: Flat affect Overall Cognitive Status: Difficult to assess Difficult to assess due to:  (degree of cooperativeness)    Extremity/Trunk Assessment Upper Extremity Assessment Upper Extremity Assessment: Overall WFL for tasks  assessed Lower Extremity Assessment Lower Extremity Assessment: Overall WFL for tasks assessed   Balance Static Sitting Balance Static Sitting - Balance Support: Feet supported Static Sitting - Level of Assistance: 6: Modified independent (Device/Increase time) Static Standing Balance Static Standing - Balance Support: No upper extremity supported Static Standing - Level of Assistance: 5: Stand by assistance  End of Session PT - End of Session Equipment Utilized During Treatment: Other (comment) (none, pt. declined safety belt) Activity Tolerance: Patient tolerated treatment well;Other (comment) (limited by decreased degree of participation) Patient left: in bed;Other (comment) (seated at EOB) Nurse Communication: Mobility status;Other (comment) (Pt will not follow due to pt.refusing)  GP     Ferman Hamming 07/23/2013, 11:58 AM Weldon Picking PT [facility] [phone] [place] [phone]

## 2013-07-23 NOTE — Progress Notes (Signed)
Patient continues to refuse medical care/advice. He refused telemetry monitoring, he refused to take medications. Systolic blood pressure around 200s. He had upset stomach after he ate a home cooked meal yesterday on Thanksgiving day. He was preoccupied that he needs to have a bowel movement. I did talk to him at length about how serious to have high blood pressure and he says "I don't care if I get a stroke" Refused ALF placement. Patient basically rejected about all the care we tried to deliver the hospital. Psych to see for competency, if he deemed competent and is still refusing medical care he will be discharged from the hospital in AM.  Clint Lipps Pager: [phone] 07/23/2013, 6:10 PM

## 2013-07-23 NOTE — Progress Notes (Signed)
Pt refused insulin.  

## 2013-07-23 NOTE — Clinical Social Work Psychosocial (Signed)
Clinical Social Work Department BRIEF PSYCHOSOCIAL ASSESSMENT 07/23/2013  Patient:  [name],[name]     Account Number:  [id number]     Admit date:  07/21/2013  Clinical Social Worker:  Lavell Luster  Date/Time:  07/23/2013 03:30 PM  Referred by:  Physician  Date Referred:  07/23/2013 Referred for  ALF Placement   Other Referral:   Interview type:  Patient Other interview type:   Patient alert and oriented at time of assessment.    PSYCHOSOCIAL DATA Living Status:  WITH ADULT CHILDREN Admitted from facility:   Level of care:   Primary support name:  [name] [phone] Primary support relationship to patient:  CHILD, ADULT Degree of support available:   Support is limited. Patient states that he does not have a great relationship with daugher.    CURRENT CONCERNS Current Concerns  Post-Acute Placement   Other Concerns:    SOCIAL WORK ASSESSMENT / PLAN CSW consulted for ALF placement. Patient is adamantly refusing ALF placement. Patient states that he was living at an ALF in the past and will NOT go back. Patient states that he has been staying with his daughter but wants to find a place of his own. Patient is noncompliant with his medication regimen. Patient states that his step daughter will help him find a place after he discharges. MD states that patient is competent and understands the possible repercussions of his decisions.   Assessment/plan status:  No Further Intervention Required Other assessment/ plan:   NONE   Information/referral to community resources:   CSW contact information left with patient.    PATIENT'S/FAMILY'S RESPONSE TO PLAN OF CARE: Patient does NOT want to seek ALF placement after discharge. Patient was pleasant and appreciative of CSw contact. CSW signing off at this time.       Roddie Mc, [place], [place], [phone]

## 2013-07-23 NOTE — Progress Notes (Signed)
Pt. Still refusing all PO medications.  However, agreed to IV Hydralazine due to elevated BP.  Luz Brazen, RN.

## 2013-07-23 NOTE — Progress Notes (Signed)
Pt Bp 201/54mmHg, educated pt the risk involved with high blood pressure  but pt remained to refused all medications not unless it is for his stomach.

## 2013-07-23 NOTE — Progress Notes (Signed)
[facility] PROGRESS NOTE  Teryn Gust WGN:[id number] DOB: 04/16/1934 DOA: 07/21/2013 PCP: Carollee Herter, MD  HPI/Subjective: Patient continues to refuse oral medications. Per nursing staff, had big meal yesterday ( Brought by his daughter, Thanksgiving meal) and vomited afterwards. He is fixed today that he has constipation he will not take anything but medication for that.  Assessment/Plan: Principal Problem:   Hypertensive urgency Active Problems:   Diabetes mellitus   Hypertension   Chronic acquired lymphedema   Acute on chronic diastolic CHF (congestive heart failure)   Peripheral edema   CKD (chronic kidney disease), stage III   Hypertension, accelerated   Accelerated hypertension -Likely secondary to noncompliance to medications, he mentioned he did not take his medication for the past few days.  -Home medication restarted, blood pressure ready 1 down to 170 systolic.  -Hydralazine IV as needed for systolic blood pressure more than 160.  -Refused the IV hydralazine last night, blood pressure back at 200/66, last night was 193/117. -Patient told me that I don't have to about his blood pressure, "because been high like this for the past 10 years". -Patient is stable otherwise, asked case management to look for ALF. Can be DC if continues to refuse meds/medical advise.  Acute on chronic diastolic CHF  -2-D echo showed EF of 60-65% with grade 1 diastolic dysfunction. -Continue lisinopril, Lasix and add amlodipine. Anyway patient is refusing all medications. -Beta blockers his be avoided because of first degree AV block.   Bilateral lower extremity edema  -Chronic lower extremity edema with both chronic diastolic CHF and CKD. Use Ace wraps.  -Patient started on aggressive diuresis with Lasix, refusing.  -Restrict fluids to 1200 mL per day, 2 g of sodium, elevate lower extremities.   Diabetes mellitus  -Patient is on glipizide, check hemoglobin A1c.   -Diabetic diet, insulin sliding scale while he is in the hospital.   CKD stage III  -Baseline creatinine about 1.67 from September of 2014, creatinine now is 1.9.  -CKD is likely secondary to hypertension and diabetes. Patient is on lisinopril.  Medical noncompliance -Patient is notorious for his noncompliance to medical advice/medications. -Received a call from his PCP Dr. Susann Givens, confirming his noncompliance/paranoid behavior.  Code Status: Full code Family Communication: Plan discussed with the patient. Disposition Plan: Remains inpatient   Consultants:  None  Procedures:  None  Antibiotics:  None   Objective: Filed Vitals:   07/23/13 1051  BP: 166/82  Pulse:   Temp:   Resp:     Intake/Output Summary (Last 24 hours) at 07/23/13 1127 Last data filed at 07/23/13 0233  Gross per 24 hour  Intake      0 ml  Output    400 ml  Net   -400 ml   Filed Weights   07/21/13 0609  Weight: 111.131 kg (245 lb)    Exam: General: Alert and awake, oriented x3, not in any acute distress. HEENT: anicteric sclera, pupils reactive to light and accommodation, EOMI CVS: S1-S2 clear, no murmur rubs or gallops Chest: clear to auscultation bilaterally, no wheezing, rales or rhonchi Abdomen: soft nontender, nondistended, normal bowel sounds, no organomegaly Extremities: no cyanosis, clubbing or edema noted bilaterally Neuro: Cranial nerves II-XII intact, no focal neurological deficits  Data Reviewed: Basic Metabolic Panel:  Recent Labs Lab 07/21/13 0717 07/22/13 0341  NA 142 140  K 4.2 4.0  CL 107 105  CO2  --  27  GLUCOSE 141* 158*  BUN 24* 24*  CREATININE 1.90* 1.80*  CALCIUM  --  8.7   Liver Function Tests: No results found for this basename: AST, ALT, ALKPHOS, BILITOT, PROT, ALBUMIN,  in the last 168 hours No results found for this basename: LIPASE, AMYLASE,  in the last 168 hours No results found for this basename: AMMONIA,  in the last 168  hours CBC:  Recent Labs Lab 07/21/13 0640 07/21/13 0717 07/22/13 0341  WBC 3.0*  --  3.0*  NEUTROABS 1.3*  --   --   HGB 10.3* 10.2* 9.5*  HCT 31.1* 30.0* 28.4*  MCV 86.6  --  86.9  PLT 203  --  180   Cardiac Enzymes: No results found for this basename: CKTOTAL, CKMB, CKMBINDEX, TROPONINI,  in the last 168 hours BNP (last 3 results)  Recent Labs  05/03/13 2158 07/21/13 0640 07/22/13 0341  PROBNP 1064.0* 1319.0* 1758.0*   CBG:  Recent Labs Lab 07/21/13 2128 07/22/13 0754 07/22/13 1159 07/22/13 1709 07/23/13 0749  GLUCAP 212* 136* 238* 154* 168*    Micro No results found for this or any previous visit (from the past 240 hour(s)).   Studies: Dg Abd 2 Views  07/23/2013   CLINICAL DATA:  Abdominal pain  EXAM: ABDOMEN - 2 VIEW  COMPARISON:  06/23/2012  FINDINGS: Scattered large and small bowel gas is identified. Fecal material is seen within the colon. No free air is noted. No abnormal mass or abnormal calcifications are noted. Degenerative changes of the lumbar spine are again seen.  IMPRESSION: No acute abnormality noted.   Electronically Signed   By: Alcide Clever M.D.   On: 07/23/2013 08:49    Scheduled Meds: . ferrous sulfate  325 mg Oral BID  . furosemide  40 mg Intravenous Q8H  . heparin  5,000 Units Subcutaneous Q8H  . hydrALAZINE  12.5 mg Oral TID  . hydrocerin  1 application Topical q morning - 10a  . insulin aspart  0-9 Units Subcutaneous TID WC  . labetalol  200 mg Oral BID  . lisinopril  10 mg Oral q morning - 10a  . pantoprazole  40 mg Oral Daily  . polyethylene glycol  17 g Oral Daily  . simvastatin  20 mg Oral q1800  . sodium chloride  3 mL Intravenous Q12H  . terazosin  5 mg Oral q morning - 10a   Continuous Infusions:      Time spent: 35 minutes    [facility] A  [facility] Pager [phone] If 7PM-7AM, please contact night-coverage at [email or web address], password [facility] 07/23/2013, 11:27 AM  LOS: 2 days

## 2013-07-23 NOTE — Progress Notes (Signed)
Pt reported that he vomited and he somehow was relieved from stomach fullness. He is used to throwing up when he was hospitalized before. RN gave PRN Miralax, pt took it and well tolerated. Advised to take sips of it. Will continue to monitor.

## 2013-07-24 LAB — BASIC METABOLIC PANEL
CO2: 30 mEq/L (ref 19–32)
Calcium: 9 mg/dL (ref 8.4–10.5)
Chloride: 97 mEq/L (ref 96–112)
GFR calc Af Amer: 39 mL/min — ABNORMAL LOW (ref >90)
Glucose, Bld: 137 mg/dL — ABNORMAL HIGH (ref 70–99)
Potassium: 3.7 mEq/L (ref 3.5–5.1)
Sodium: 136 mEq/L (ref 135–145)

## 2013-07-24 LAB — GLUCOSE, CAPILLARY
Glucose-Capillary: 133 mg/dL — ABNORMAL HIGH (ref 70–99)
Glucose-Capillary: 175 mg/dL — ABNORMAL HIGH (ref 70–99)
Glucose-Capillary: 189 mg/dL — ABNORMAL HIGH (ref 70–99)
Glucose-Capillary: 148 mg/dL — ABNORMAL HIGH (ref 70–99)

## 2013-07-24 NOTE — Discharge Summary (Signed)
Physician Discharge Summary  [name] ZOX:[id number] DOB: 12-10-1933 DOA: 07/21/2013  PCP: [name] Herter, MD  Admit date: 07/21/2013 Discharge date: 07/24/2013  Time spent: 40 minutes  Recommendations for Outpatient Follow-up:  1. Followup with primary care physician within one week. 2. Home health services for INR around, to followup with CHF medications and Ace wraps.  Discharge Diagnoses:  Principal Problem:   Hypertensive urgency Active Problems:   Diabetes mellitus   Hypertension   Chronic acquired lymphedema   Acute on chronic diastolic CHF (congestive heart failure)   Peripheral edema   CKD (chronic kidney disease), stage III   Hypertension, accelerated   Discharge Condition: Guarded  Diet recommendation: Carbohydrate modified diet  Filed Weights   07/21/13 0609  Weight: 111.131 kg (245 lb)    History of present illness:  [name] is a 79 y.o. male with past medical history of hypertension, diabetes mellitus and stage III CKD came in to the hospital because of worsening of his lower extremity swelling. Patient said he has chronic lower extremity swelling for the past 2 years, this is worsens and improves depending on his body fluid status. For the past several days it was worsening, his blood pressure also was worsening. He did not take his blood pressure medication for the past few days. Overall the patient is a very poor historian, cannot stay focused in one subject. He was in assisted living facility recently but for the past 2 months he was staying with his daughter.  In the ED his blood pressure was found to be 200/64, he has +3 bilateral lower extremity edema and creatinine of 1.9. Patient will be admitted to the hospital for the accelerated hypertension.  Hospital Course:   Accelerated hypertension  Patient presents to the hospital with accelerated hypertension with blood pressure of over 200/100, patient has history of noncompliance to  medical therapy, he admitted that he was not taking his medication for the past "few" days. His medication restarted in the hospital and hydralazine IV as needed for systolic blood pressure more than 160 started. Patient refused to take his medication, for multiple nurses in multiple shifts for multiple days. I spoke with the patient in more than 3 occasions for prolonged period of time about how serious it is his blood pressure and he stated that I don't have to worry about his blood pressure this much "because been high like this for the past 10 years". When I explained to him he can probably have a stroke from that patient said he does not care. Patient refused telemetry monitoring, and per nursing staff he was choosing between medication what to take and what not to. He was recently in assisted living facility and he signed himself out of the facility, stated for sometimes in his daughter's place. I have asked the social worker to talk to the patient about going back to ALF, patient refused. I spoke with the patient and I told him if he continues to refuse medical interventions/advice he will be discharged from the hospital because he is using it as a shelter rather than place of healing. I have asked psych also to evaluate the patient for competency as causing clear if he knows the consequences of these decisions. Psych evaluated the patient and he is deemed to be competent. As patient clearly understand what the consequences of not taking the medication he was discharged his daughter's home. Advised to followup with his primary care physician within this week.  Acute on chronic diastolic CHF  -  2-D echo showed EF of 60-65% with grade 1 diastolic dysfunction.  -Continue lisinopril, Lasix and add amlodipine. Anyway patient is refusing all medications.  -Beta blockers his be avoided because of first degree AV block.   Bilateral lower extremity edema  -Chronic lower extremity edema with both chronic  diastolic CHF and CKD. Use Ace wraps.  -Patient started on aggressive diuresis with Lasix, refusing.  -Restrict fluids to 1200 mL per day, <2 g of sodium, elevate lower extremities.   Diabetes mellitus  -Patient is on glipizide, check hemoglobin A1c.  -Diabetic diet, insulin sliding scale while he is in the hospital.   CKD stage III  -Baseline creatinine about 1.67 from September of 2014, creatinine now is 1.9.  -CKD is likely secondary to hypertension and diabetes. Patient is on lisinopril.   Medical noncompliance  -Patient is well-known for noncompliance to medical advice/treatment. As mentioned above patient was in assisted living facility, he stayed in his daughter's place for some time if he signed himself out from the assisted living facility. When admitted the patient in the emergency department he told me is been suffering these problems for the past 2 years and when I asked him what made him come to the emergency department today he said "I have no place to go". I spoke with both his primary care physician Dr. Susann [name] and his daughter [name], both showed their frustration with [name] as he does not do what he supposed to, he does not take his medication or take care of himself.  Procedures:  None  Consultations:  Psych  Discharge Exam: Filed Vitals:   07/24/13 0709  BP: 150/74  Pulse: 71  Temp: 98.8 F (37.1 C)  Resp: 18   General: Alert and awake, oriented x3, not in any acute distress. HEENT: anicteric sclera, pupils reactive to light and accommodation, EOMI CVS: S1-S2 clear, no murmur rubs or gallops Chest: clear to auscultation bilaterally, no wheezing, rales or rhonchi Abdomen: soft nontender, nondistended, normal bowel sounds, no organomegaly Extremities: no cyanosis, clubbing or edema noted bilaterally Neuro: Cranial nerves II-XII intact, no focal neurological deficits  Discharge Instructions   Future Appointments Provider Department Dept  Phone   10/07/2013 10:30 AM [name] Nian, MD [facility] Medicine [phone]       Medication List    STOP taking these medications       terazosin 5 MG capsule  Commonly known as:  HYTRIN      TAKE these medications       ferrous sulfate 325 (65 FE) MG tablet  Take 325 mg by mouth 2 (two) times daily.     furosemide 40 MG tablet  Commonly known as:  LASIX  TAKE 1 TABLET BY MOUTH TWICE A DAY     glipiZIDE 5 MG tablet  Commonly known as:  GLUCOTROL  Take 5 mg by mouth 2 (two) times daily before a meal.     hydrALAZINE 25 MG tablet  Commonly known as:  APRESOLINE  Take 12.5 mg by mouth 3 (three) times daily.     hydrocerin Crea  Apply 1 application topically every morning. Apply to lower extremities     labetalol 200 MG tablet  Commonly known as:  NORMODYNE  Take 200 mg by mouth 2 (two) times daily.     lisinopril 10 MG tablet  Commonly known as:  PRINIVIL,ZESTRIL  Take 10 mg by mouth every morning.     omeprazole 20 MG capsule  Commonly known as:  PRILOSEC  Take 40 mg by mouth every morning.     pravastatin 10 MG tablet  Commonly known as:  PRAVACHOL  Take 10 mg by mouth every evening.     SYSTANE 0.4-0.3 % Soln  Generic drug:  Polyethyl Glycol-Propyl Glycol  Place 1 drop into both eyes 3 (three) times daily.       No Known Allergies     Follow-up Information   Follow up with [name] Herter, MD In 1 week.   Specialty:  Family Medicine   Contact information:   [address] [place] [address] [phone]        The results of significant diagnostics from this hospitalization (including imaging, microbiology, ancillary and laboratory) are listed below for reference.    Significant Diagnostic Studies: Dg Chest 2 View  07/21/2013   CLINICAL DATA:  Bilateral leg swelling.  EXAM: CHEST  2 VIEW  COMPARISON:  05/03/2013  FINDINGS: The cardiac silhouette remains mildly enlarged, unchanged. There is mild central pulmonary  vascular congestion without evidence of overt edema. No airspace consolidation, pleural effusion, or pneumothorax is identified. There is mild lower thoracic dextroscoliosis. Multilevel disc space narrowing and mild osteophytosis are noted in the thoracic spine.  IMPRESSION: Mild central pulmonary vascular congestion. No evidence of pulmonary edema or pneumonia.   Electronically Signed   By: Sebastian Ache   On: 07/21/2013 07:36   Dg Abd 2 Views  07/23/2013   CLINICAL DATA:  Abdominal pain  EXAM: ABDOMEN - 2 VIEW  COMPARISON:  06/23/2012  FINDINGS: Scattered large and small bowel gas is identified. Fecal material is seen within the colon. No free air is noted. No abnormal mass or abnormal calcifications are noted. Degenerative changes of the lumbar spine are again seen.  IMPRESSION: No acute abnormality noted.   Electronically Signed   By: Alcide Clever M.D.   On: 07/23/2013 08:49    Microbiology: No results found for this or any previous visit (from the past 240 hour(s)).   Labs: Basic Metabolic Panel:  Recent Labs Lab 07/21/13 0717 07/22/13 0341 07/23/13 1237 07/24/13 0540  NA 142 140 135 136  K 4.2 4.0 4.0 3.7  CL 107 105 99 97  CO2  --  27 28 30   GLUCOSE 141* 158* 155* 137*  BUN 24* 24* 22 25*  CREATININE 1.90* 1.80* 1.64* 1.85*  CALCIUM  --  8.7 9.2 9.0   Liver Function Tests: No results found for this basename: AST, ALT, ALKPHOS, BILITOT, PROT, ALBUMIN,  in the last 168 hours No results found for this basename: LIPASE, AMYLASE,  in the last 168 hours No results found for this basename: AMMONIA,  in the last 168 hours CBC:  Recent Labs Lab 07/21/13 0640 07/21/13 0717 07/22/13 0341  WBC 3.0*  --  3.0*  NEUTROABS 1.3*  --   --   HGB 10.3* 10.2* 9.5*  HCT 31.1* 30.0* 28.4*  MCV 86.6  --  86.9  PLT 203  --  180   Cardiac Enzymes: No results found for this basename: CKTOTAL, CKMB, CKMBINDEX, TROPONINI,  in the last 168 hours BNP: BNP (last 3 results)  Recent Labs   05/03/13 2158 07/21/13 0640 07/22/13 0341  PROBNP 1064.0* 1319.0* 1758.0*   CBG:  Recent Labs Lab 07/23/13 1153 07/23/13 1655 07/23/13 2159 07/24/13 0752 07/24/13 1143  GLUCAP 152* 143* 148* 133* 175*       Signed:  Theodoros Stjames A  [facility] 07/24/2013, 1:09 PM

## 2013-07-24 NOTE — Progress Notes (Signed)
PT is still refusing his telemetry box and his heparin. RN explained to pt the importance of both.Pt still refused.  Pt also refused to let RN to access his legs under his ace wrap.  RN will continue to monitor pt.

## 2013-07-25 DIAGNOSIS — F4323 Adjustment disorder with mixed anxiety and depressed mood: Secondary | ICD-10-CM

## 2013-07-25 NOTE — Progress Notes (Signed)
07/25/13 Patient is going home with daughter. IV site removed. Discharge instructions reviewed with patient.

## 2013-07-25 NOTE — Progress Notes (Signed)
Patient refused all medications. Explained to patient the importance of the medications and he stated that he was not taking his medications. Will continue to monitor the patient.

## 2013-07-25 NOTE — Progress Notes (Signed)
   CARE MANAGEMENT NOTE 07/25/2013  Patient:  [name],[name]   Account Number:  [id number]  Date Initiated:  07/25/2013  Documentation initiated by:  [facility]  Subjective/Objective Assessment:   adm:  Accelerated hypertension     Action/Plan:   discharge planning   Anticipated DC Date:  07/25/2013   Anticipated DC Plan:  HOME W HOME HEALTH SERVICES      DC Planning Services  CM consult      [facility] Choice  HOME HEALTH   Choice offered to / List presented to:  C-1 Patient        HH arranged  HH - 11 Patient Refused      Status of service:  Completed, signed off Medicare Important Message given?   (If response is "NO", the following Medicare IM given date fields will be blank) Date Medicare IM given:   Date Additional Medicare IM given:    Discharge Disposition:  HOME/SELF CARE  Per UR Regulation:    If discussed at Long Length of Stay Meetings, dates discussed:    Comments:  07/25/13 10:55 Cm spoke with pt to offer choice.  Pt extremely agitated and refused home health.  CSW spoke with pt earlier concerning ALF but no positive outcome with either that discussion or HH.  No other CM needs were communicated.  Freddy Jaksch, BSN, CM [phone].
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
## 2013-07-25 NOTE — Consult Note (Signed)
[facility] Face-to-Face Psychiatry Consult   Reason for Consult:  capacity Referring Physician:  Dr. [name] is an 79 y.o. male.  Assessment: AXIS I:  Adjustment Disorder with Mixed Emotional Features AXIS II:  Deferred AXIS III:   Past Medical History  Diagnosis Date  . Smoker   . Narcolepsy   . Hypertension   . BPH (benign prostatic hyperplasia)   . Arthritis   . Diabetes mellitus   . Neuropathy in diabetes   . Herpes   . Dyslipidemia   . Obesity   . Lymphedema     LE lymphedemia with hx of cellulitis.  . CKD (chronic kidney disease)   . Anemia   . History of GI bleed 06/2012    EGD unremarkable;  Colo with large cecal polyp (bx benign)  . Ischemic cardiomyopathy     Echo 06/19/12: EF 40-45%, mid to distal anteroseptal HK, moderate LAE, PASP 37  . Chronic systolic heart failure   . CAD (coronary artery disease)     a. probable underlying CAD;  b. Type 2 NSTEMi in setting of profound anemia (Hgb 3.6) from GI bleed in 06/2012 and WMA on echo;  c. med Rx due to CKD and GI bleed  . CHF (congestive heart failure)   . Atrial fibrillation    AXIS IV:  other psychosocial or environmental problems AXIS V:  61-70 mild symptoms  Plan:  No evidence of imminent risk to self or others at present.   Patient does not meet criteria for psychiatric inpatient admission. Supportive therapy provided about ongoing stressors. Discussed crisis plan, support from social network, calling 911, coming to the Emergency Department, and calling Suicide Hotline.  Subjective:   [name] is a 79 y.o. male patient admitted with edema. Medical record was reviewed. Pt reports coming to the hospital to help with his leg swelling. He has been refusing his meds, because he feels the meds "drain my energy and they are not helping". He spent the last 5 years taking care of his terminally ill wife, and "I have not had a moment's peace since then". He states that his daughters want his  retirement money. He signed himself out of his ALF, and tomorrow when he gets paid, he plans to find his own apartment. He reports poor sleep, but good appetite. He denies SI/HI/AVH. His mood has been irritable for the past few weeks, because he feels that his family is trying to take his money. He denies a history of major depressive or manic episodes. No generalized anxiety or trauma. No history of psychosis.  HPI:  No history of suicide attempts or psychiatric hospitalizations. No previous psychotropic medications. No previous mental health treatment.   Past Psychiatric History: Past Medical History  Diagnosis Date  . Smoker   . Narcolepsy   . Hypertension   . BPH (benign prostatic hyperplasia)   . Arthritis   . Diabetes mellitus   . Neuropathy in diabetes   . Herpes   . Dyslipidemia   . Obesity   . Lymphedema     LE lymphedemia with hx of cellulitis.  . CKD (chronic kidney disease)   . Anemia   . History of GI bleed 06/2012    EGD unremarkable;  Colo with large cecal polyp (bx benign)  . Ischemic cardiomyopathy     Echo 06/19/12: EF 40-45%, mid to distal anteroseptal HK, moderate LAE, PASP 37  . Chronic systolic heart failure   . CAD (coronary artery disease)  a. probable underlying CAD;  b. Type 2 NSTEMi in setting of profound anemia (Hgb 3.6) from GI bleed in 06/2012 and WMA on echo;  c. med Rx due to CKD and GI bleed  . CHF (congestive heart failure)   . Atrial fibrillation     reports that he quit smoking about 14 years ago. He has never used smokeless tobacco. His alcohol and drug histories are not on file. History reviewed. No pertinent family history.       Abuse/Neglect [facility]) Physical Abuse: Denies Verbal Abuse: Denies Sexual Abuse: Denies Allergies:  No Known Allergies  ACT Assessment Complete:  Yes:    Educational Status    Risk to Self: Risk to self Is patient at risk for suicide?: No  Risk to Others:    Abuse: Abuse/Neglect Assessment (Assessment to  be complete while patient is alone) Physical Abuse: Denies Verbal Abuse: Denies Sexual Abuse: Denies Exploitation of patient/patient's resources: Denies Self-Neglect: Denies  Prior Inpatient Therapy:    Prior Outpatient Therapy:    Additional Information:     He stopped smoking in 2000. He stopped drinking alcohol in the 1980s. No history of detox or rehab. No illicit drug use reported.  Social History: He has been retired for 14 years, from [employer]. He has 2 daughters (1 step and 1 biological). He left his previous ALF (nursing home), because he feels they sedated him. He has a brother and sister. He reports that his family feels he is in debt, and they feel that he cannot manage his finances. He went to 10th grade.               Objective: Blood pressure 129/64, pulse 73, temperature 98.9 F (37.2 C), temperature source Oral, resp. rate 18, height 5\' 10"  (1.778 m), weight 111.131 kg (245 lb), SpO2 95.00%.Body mass index is 35.15 kg/(m^2). Results for orders placed during the hospital encounter of 07/21/13 (from the past 72 hour(s))  GLUCOSE, CAPILLARY     Status: Abnormal   Collection Time    07/22/13  5:09 PM      Result Value Range   Glucose-Capillary 154 (*) 70 - 99 mg/dL  GLUCOSE, CAPILLARY     Status: Abnormal   Collection Time    07/23/13  7:49 AM      Result Value Range   Glucose-Capillary 168 (*) 70 - 99 mg/dL  GLUCOSE, CAPILLARY     Status: Abnormal   Collection Time    07/23/13 11:53 AM      Result Value Range   Glucose-Capillary 152 (*) 70 - 99 mg/dL  BASIC METABOLIC PANEL     Status: Abnormal   Collection Time    07/23/13 12:37 PM      Result Value Range   Sodium 135  135 - 145 mEq/L   Potassium 4.0  3.5 - 5.1 mEq/L   Chloride 99  96 - 112 mEq/L   CO2 28  19 - 32 mEq/L   Glucose, Bld 155 (*) 70 - 99 mg/dL   BUN 22  6 - 23 mg/dL   Creatinine, Ser 9.60 (*) 0.50 - 1.35 mg/dL   Calcium 9.2  8.4 - 45.4 mg/dL   GFR calc non Af Amer 39  (*) >90 mL/min   GFR calc Af Amer 45 (*) >90 mL/min   Comment: (NOTE)     The eGFR has been calculated using the CKD EPI equation.     This calculation has not been validated in all clinical situations.  eGFR's persistently <90 mL/min signify possible Chronic Kidney     Disease.  GLUCOSE, CAPILLARY     Status: Abnormal   Collection Time    07/23/13  4:55 PM      Result Value Range   Glucose-Capillary 143 (*) 70 - 99 mg/dL  GLUCOSE, CAPILLARY     Status: Abnormal   Collection Time    07/23/13  9:59 PM      Result Value Range   Glucose-Capillary 148 (*) 70 - 99 mg/dL   Comment 1 Documented in Chart     Comment 2 Notify RN    BASIC METABOLIC PANEL     Status: Abnormal   Collection Time    07/24/13  5:40 AM      Result Value Range   Sodium 136  135 - 145 mEq/L   Potassium 3.7  3.5 - 5.1 mEq/L   Chloride 97  96 - 112 mEq/L   CO2 30  19 - 32 mEq/L   Glucose, Bld 137 (*) 70 - 99 mg/dL   BUN 25 (*) 6 - 23 mg/dL   Creatinine, Ser 1.61 (*) 0.50 - 1.35 mg/dL   Calcium 9.0  8.4 - 09.6 mg/dL   GFR calc non Af Amer 33 (*) >90 mL/min   GFR calc Af Amer 39 (*) >90 mL/min   Comment: (NOTE)     The eGFR has been calculated using the CKD EPI equation.     This calculation has not been validated in all clinical situations.     eGFR's persistently <90 mL/min signify possible Chronic Kidney     Disease.  GLUCOSE, CAPILLARY     Status: Abnormal   Collection Time    07/24/13  7:52 AM      Result Value Range   Glucose-Capillary 133 (*) 70 - 99 mg/dL   Comment 1 Documented in Chart     Comment 2 Notify RN    GLUCOSE, CAPILLARY     Status: Abnormal   Collection Time    07/24/13 11:43 AM      Result Value Range   Glucose-Capillary 175 (*) 70 - 99 mg/dL   Comment 1 Documented in Chart     Comment 2 Notify RN    GLUCOSE, CAPILLARY     Status: Abnormal   Collection Time    07/24/13  5:24 PM      Result Value Range   Glucose-Capillary 189 (*) 70 - 99 mg/dL   Comment 1 Notify RN     GLUCOSE, CAPILLARY     Status: Abnormal   Collection Time    07/24/13  9:05 PM      Result Value Range   Glucose-Capillary 148 (*) 70 - 99 mg/dL   Comment 1 Notify RN    GLUCOSE, CAPILLARY     Status: Abnormal   Collection Time    07/25/13  7:48 AM      Result Value Range   Glucose-Capillary 154 (*) 70 - 99 mg/dL  GLUCOSE, CAPILLARY     Status: Abnormal   Collection Time    07/25/13 11:46 AM      Result Value Range   Glucose-Capillary 182 (*) 70 - 99 mg/dL   Labs are reviewed and are pertinent for (followed by hospitalist).  Current Facility-Administered Medications  Medication Dose Route Frequency Provider Last Rate Last Dose  . acetaminophen (TYLENOL) tablet 650 mg  650 mg Oral Q6H PRN Clydia Llano, MD       Or  . acetaminophen (  TYLENOL) suppository 650 mg  650 mg Rectal Q6H PRN Clydia Llano, MD      . alum & mag hydroxide-simeth (MAALOX/MYLANTA) 200-200-20 MG/5ML suspension 30 mL  30 mL Oral Q6H PRN Clydia Llano, MD      . docusate sodium (COLACE) capsule 100 mg  100 mg Oral BID PRN Acey Lav, MD      . ferrous sulfate tablet 325 mg  325 mg Oral BID Clydia Llano, MD   325 mg at 07/24/13 0941  . furosemide (LASIX) injection 40 mg  40 mg Intravenous Q8H Mutaz Elmahi, MD   40 mg at 07/24/13 1359  . heparin injection 5,000 Units  5,000 Units Subcutaneous Q8H Clydia Llano, MD   5,000 Units at 07/24/13 1357  . hydrALAZINE (APRESOLINE) injection 5 mg  5 mg Intravenous Q6H PRN Clydia Llano, MD   5 mg at 07/23/13 0928  . hydrALAZINE (APRESOLINE) tablet 12.5 mg  12.5 mg Oral TID Clydia Llano, MD   12.5 mg at 07/24/13 0944  . hydrocerin (EUCERIN) cream 1 application  1 application Topical q morning - 10a Clydia Llano, MD   1 application at 07/24/13 0945  . HYDROcodone-acetaminophen (NORCO/VICODIN) 5-325 MG per tablet 1-2 tablet  1-2 tablet Oral Q4H PRN Clydia Llano, MD      . insulin aspart (novoLOG) injection 0-9 Units  0-9 Units Subcutaneous TID WC Clydia Llano, MD   2 Units at  07/22/13 1716  . labetalol (NORMODYNE) tablet 200 mg  200 mg Oral BID Clydia Llano, MD   200 mg at 07/24/13 0944  . lisinopril (PRINIVIL,ZESTRIL) tablet 10 mg  10 mg Oral q morning - 10a Clydia Llano, MD   10 mg at 07/24/13 0944  . morphine 2 MG/ML injection 1-2 mg  1-2 mg Intravenous Q4H PRN Clydia Llano, MD      . ondansetron (ZOFRAN) tablet 4 mg  4 mg Oral Q6H PRN Clydia Llano, MD       Or  . ondansetron (ZOFRAN) injection 4 mg  4 mg Intravenous Q6H PRN Clydia Llano, MD      . pantoprazole (PROTONIX) EC tablet 40 mg  40 mg Oral Daily Clydia Llano, MD   40 mg at 07/24/13 0945  . polyethylene glycol (MIRALAX / GLYCOLAX) packet 17 g  17 g Oral Daily Acey Lav, MD   17 g at 07/24/13 0943  . simvastatin (ZOCOR) tablet 20 mg  20 mg Oral q1800 Clydia Llano, MD   20 mg at 07/24/13 1744  . sodium chloride 0.9 % injection 3 mL  3 mL Intravenous Q12H Clydia Llano, MD   3 mL at 07/24/13 0952  . terazosin (HYTRIN) capsule 5 mg  5 mg Oral q morning - 10a Clydia Llano, MD   5 mg at 07/24/13 0944    Psychiatric Specialty Exam:     Blood pressure 129/64, pulse 73, temperature 98.9 F (37.2 C), temperature source Oral, resp. rate 18, height 5\' 10"  (1.778 m), weight 111.131 kg (245 lb), SpO2 95.00%.Body mass index is 35.15 kg/(m^2).  General Appearance: Fairly Groomed  Patent attorney::  Fair  Speech:  Normal Rate  Volume:  Increased  Mood:  Angry and Irritable  Affect:  Appropriate and Congruent  Thought Process:  Coherent and Goal Directed  Orientation:  Full (Time, Place, and Person)  Thought Content:  Hallucinations: None  Suicidal Thoughts:  No  Homicidal Thoughts:  No  Memory:  Immediate;   Fair Recent;   Fair Remote;   Fair  Judgement:  Intact  Insight:  Fair  Psychomotor Activity:  Normal  Concentration:  Fair  Recall:  Fair  Akathisia:  Negative  Handed:  Right  AIMS (if indicated):     Assets:  Communication Skills Resilience Social Support  Sleep:      Treatment Plan  Summary: At this time, the patient appears to have capacity to make medical decisions. He understands the risks and benefits of taking medications, and chooses not to take the medications because they "make me tired and do not help".  He wants to find his own apartment tomorrow, once he gets paid. He may benefit from following up with an outpatient therapist (to discuss his conflict with his family), if he is willing to attend therapy.   Ancil Linsey 07/25/2013 4:15 PM

## 2013-07-25 NOTE — Progress Notes (Signed)
[facility] PROGRESS NOTE Late entry for progress note [name] ZOX:[id number] DOB: September 14, 1933 DOA: 07/21/2013 PCP: Carollee Herter, MD  HPI/Subjective: Still about the same denies any complaints. Did not take any medication. Only does he wants to do.  Assessment/Plan: Principal Problem:   Hypertensive urgency Active Problems:   Diabetes mellitus   Hypertension   Chronic acquired lymphedema   Acute on chronic diastolic CHF (congestive heart failure)   Peripheral edema   CKD (chronic kidney disease), stage III   Hypertension, accelerated   Accelerated hypertension -Likely secondary to noncompliance to medications, he mentioned he did not take his medication for the past few days.  -Home medication restarted, blood pressure ready 1 down to 170 systolic.  -Hydralazine IV as needed for systolic blood pressure more than 160.  -Refused the IV hydralazine last night, blood pressure back at 200/66, last night was 193/117. -Patient told me that I don't have to about his blood pressure, "because been high like this for the past 10 years". -Patient is stable otherwise, asked case management to look for ALF. Can be DC if continues to refuse meds/medical advise.  Acute on chronic diastolic CHF  -2-D echo showed EF of 60-65% with grade 1 diastolic dysfunction. -Continue lisinopril, Lasix and add amlodipine. Anyway patient is refusing all medications. -Beta blockers his be avoided because of first degree AV block.   Bilateral lower extremity edema  -Chronic lower extremity edema with both chronic diastolic CHF and CKD. Use Ace wraps.  -Patient started on aggressive diuresis with Lasix, refusing.  -Restrict fluids to 1200 mL per day, 2 g of sodium, elevate lower extremities.   Diabetes mellitus  -Patient is on glipizide, check hemoglobin A1c.  -Diabetic diet, insulin sliding scale while he is in the hospital.   CKD stage III  -Baseline creatinine about 1.67 from  September of 2014, creatinine now is 1.9.  -CKD is likely secondary to hypertension and diabetes. Patient is on lisinopril.  Medical noncompliance -Patient is notorious for his noncompliance to medical advice/medications. -Received a call from his PCP Dr. Susann Givens, confirming his noncompliance/paranoid behavior.  Code Status: Full code Family Communication: Plan discussed with the patient. Disposition Plan: Remains inpatient   Consultants:  None  Procedures:  None  Antibiotics:  None   Objective: Filed Vitals:   07/25/13 0552  BP: 170/72  Pulse: 61  Temp: 98.5 F (36.9 C)  Resp: 18    Intake/Output Summary (Last 24 hours) at 07/25/13 1159 Last data filed at 07/25/13 0552  Gross per 24 hour  Intake      0 ml  Output    575 ml  Net   -575 ml   Filed Weights   07/21/13 0609  Weight: 111.131 kg (245 lb)    Exam: General: Alert and awake, oriented x3, not in any acute distress. HEENT: anicteric sclera, pupils reactive to light and accommodation, EOMI CVS: S1-S2 clear, no murmur rubs or gallops Chest: clear to auscultation bilaterally, no wheezing, rales or rhonchi Abdomen: soft nontender, nondistended, normal bowel sounds, no organomegaly Extremities: no cyanosis, clubbing or edema noted bilaterally Neuro: Cranial nerves II-XII intact, no focal neurological deficits  Data Reviewed: Basic Metabolic Panel:  Recent Labs Lab 07/21/13 0717 07/22/13 0341 07/23/13 1237 07/24/13 0540  NA 142 140 135 136  K 4.2 4.0 4.0 3.7  CL 107 105 99 97  CO2  --  27 28 30   GLUCOSE 141* 158* 155* 137*  BUN 24* 24* 22 25*  CREATININE 1.90* 1.80* 1.64*  1.85*  CALCIUM  --  8.7 9.2 9.0   Liver Function Tests: No results found for this basename: AST, ALT, ALKPHOS, BILITOT, PROT, ALBUMIN,  in the last 168 hours No results found for this basename: LIPASE, AMYLASE,  in the last 168 hours No results found for this basename: AMMONIA,  in the last 168 hours CBC:  Recent  Labs Lab 07/21/13 0640 07/21/13 0717 07/22/13 0341  WBC 3.0*  --  3.0*  NEUTROABS 1.3*  --   --   HGB 10.3* 10.2* 9.5*  HCT 31.1* 30.0* 28.4*  MCV 86.6  --  86.9  PLT 203  --  180   Cardiac Enzymes: No results found for this basename: CKTOTAL, CKMB, CKMBINDEX, TROPONINI,  in the last 168 hours BNP (last 3 results)  Recent Labs  05/03/13 2158 07/21/13 0640 07/22/13 0341  PROBNP 1064.0* 1319.0* 1758.0*   CBG:  Recent Labs Lab 07/24/13 0752 07/24/13 1143 07/24/13 1724 07/24/13 2105 07/25/13 0748  GLUCAP 133* 175* 189* 148* 154*    Micro No results found for this or any previous visit (from the past 240 hour(s)).   Studies: No results found.  Scheduled Meds: . ferrous sulfate  325 mg Oral BID  . furosemide  40 mg Intravenous Q8H  . heparin  5,000 Units Subcutaneous Q8H  . hydrALAZINE  12.5 mg Oral TID  . hydrocerin  1 application Topical q morning - 10a  . insulin aspart  0-9 Units Subcutaneous TID WC  . labetalol  200 mg Oral BID  . lisinopril  10 mg Oral q morning - 10a  . pantoprazole  40 mg Oral Daily  . polyethylene glycol  17 g Oral Daily  . simvastatin  20 mg Oral q1800  . sodium chloride  3 mL Intravenous Q12H  . terazosin  5 mg Oral q morning - 10a   Continuous Infusions:      Time spent: 35 minutes    [facility] A  [facility] Pager [phone] If 7PM-7AM, please contact night-coverage at [email or web address], password [facility] 07/25/2013, 11:59 AM  LOS: 4 days

## 2013-07-26 ENCOUNTER — Encounter (HOSPITAL_COMMUNITY): Payer: Self-pay | Admitting: Emergency Medicine

## 2013-07-26 ENCOUNTER — Emergency Department (HOSPITAL_COMMUNITY)
Admission: EM | Admit: 2013-07-26 | Discharge: 2013-07-27 | Disposition: A | Discharge status: Home / Self Care | Payer: Medicare Other | Attending: Emergency Medicine | Admitting: Emergency Medicine

## 2013-07-26 ENCOUNTER — Emergency Department (HOSPITAL_COMMUNITY): Payer: Medicare Other

## 2013-07-26 DIAGNOSIS — I5022 Chronic systolic (congestive) heart failure: Secondary | ICD-10-CM | POA: Insufficient documentation

## 2013-07-26 DIAGNOSIS — R142 Eructation: Secondary | ICD-10-CM | POA: Insufficient documentation

## 2013-07-26 DIAGNOSIS — E119 Type 2 diabetes mellitus without complications: Secondary | ICD-10-CM | POA: Insufficient documentation

## 2013-07-26 DIAGNOSIS — M7989 Other specified soft tissue disorders: Secondary | ICD-10-CM | POA: Insufficient documentation

## 2013-07-26 DIAGNOSIS — E785 Hyperlipidemia, unspecified: Secondary | ICD-10-CM | POA: Insufficient documentation

## 2013-07-26 DIAGNOSIS — I2589 Other forms of chronic ischemic heart disease: Secondary | ICD-10-CM | POA: Insufficient documentation

## 2013-07-26 DIAGNOSIS — G47419 Narcolepsy without cataplexy: Secondary | ICD-10-CM | POA: Insufficient documentation

## 2013-07-26 DIAGNOSIS — R1084 Generalized abdominal pain: Secondary | ICD-10-CM | POA: Insufficient documentation

## 2013-07-26 DIAGNOSIS — I890 Lymphedema, not elsewhere classified: Secondary | ICD-10-CM

## 2013-07-26 DIAGNOSIS — N189 Chronic kidney disease, unspecified: Secondary | ICD-10-CM | POA: Insufficient documentation

## 2013-07-26 DIAGNOSIS — R110 Nausea: Secondary | ICD-10-CM | POA: Insufficient documentation

## 2013-07-26 DIAGNOSIS — N400 Enlarged prostate without lower urinary tract symptoms: Secondary | ICD-10-CM | POA: Insufficient documentation

## 2013-07-26 DIAGNOSIS — I129 Hypertensive chronic kidney disease with stage 1 through stage 4 chronic kidney disease, or unspecified chronic kidney disease: Secondary | ICD-10-CM | POA: Insufficient documentation

## 2013-07-26 DIAGNOSIS — R141 Gas pain: Secondary | ICD-10-CM | POA: Insufficient documentation

## 2013-07-26 DIAGNOSIS — I10 Essential (primary) hypertension: Secondary | ICD-10-CM

## 2013-07-26 DIAGNOSIS — D649 Anemia, unspecified: Secondary | ICD-10-CM | POA: Insufficient documentation

## 2013-07-26 DIAGNOSIS — I4891 Unspecified atrial fibrillation: Secondary | ICD-10-CM | POA: Insufficient documentation

## 2013-07-26 DIAGNOSIS — E669 Obesity, unspecified: Secondary | ICD-10-CM | POA: Insufficient documentation

## 2013-07-26 DIAGNOSIS — M129 Arthropathy, unspecified: Secondary | ICD-10-CM | POA: Insufficient documentation

## 2013-07-26 DIAGNOSIS — Z8719 Personal history of other diseases of the digestive system: Secondary | ICD-10-CM | POA: Insufficient documentation

## 2013-07-26 DIAGNOSIS — Z9119 Patient's noncompliance with other medical treatment and regimen: Secondary | ICD-10-CM | POA: Insufficient documentation

## 2013-07-26 DIAGNOSIS — Z79899 Other long term (current) drug therapy: Secondary | ICD-10-CM | POA: Insufficient documentation

## 2013-07-26 DIAGNOSIS — I2510 Atherosclerotic heart disease of native coronary artery without angina pectoris: Secondary | ICD-10-CM | POA: Insufficient documentation

## 2013-07-26 DIAGNOSIS — Z8619 Personal history of other infectious and parasitic diseases: Secondary | ICD-10-CM | POA: Insufficient documentation

## 2013-07-26 DIAGNOSIS — Z91199 Patient's noncompliance with other medical treatment and regimen due to unspecified reason: Secondary | ICD-10-CM | POA: Insufficient documentation

## 2013-07-26 DIAGNOSIS — R109 Unspecified abdominal pain: Secondary | ICD-10-CM

## 2013-07-26 DIAGNOSIS — K5900 Constipation, unspecified: Secondary | ICD-10-CM | POA: Insufficient documentation

## 2013-07-26 DIAGNOSIS — Z87891 Personal history of nicotine dependence: Secondary | ICD-10-CM | POA: Insufficient documentation

## 2013-07-26 LAB — CBC WITH DIFFERENTIAL/PLATELET
Eosinophils Absolute: 0 10*3/uL (ref 0.0–0.7)
Eosinophils Relative: 1 % (ref 0–5)
HCT: 33.3 % — ABNORMAL LOW (ref 39.0–52.0)
Lymphocytes Relative: 20 % (ref 12–46)
Lymphs Abs: 0.6 10*3/uL — ABNORMAL LOW (ref 0.7–4.0)
MCH: 28.5 pg (ref 26.0–34.0)
MCV: 86.3 fL (ref 78.0–100.0)
Monocytes Absolute: 1 10*3/uL (ref 0.1–1.0)
Neutrophils Relative %: 48 % (ref 43–77)
Platelets: 101 10*3/uL — ABNORMAL LOW (ref 150–400)
RBC: 3.86 MIL/uL — ABNORMAL LOW (ref 4.22–5.81)
RDW: 13.5 % (ref 11.5–15.5)
WBC: 3.1 10*3/uL — ABNORMAL LOW (ref 4.0–10.5)

## 2013-07-26 LAB — COMPREHENSIVE METABOLIC PANEL
ALT: 9 U/L (ref 0–53)
CO2: 25 mEq/L (ref 19–32)
Calcium: 8.9 mg/dL (ref 8.4–10.5)
Chloride: 100 mEq/L (ref 96–112)
Creatinine, Ser: 1.98 mg/dL — ABNORMAL HIGH (ref 0.50–1.35)
GFR calc Af Amer: 36 mL/min — ABNORMAL LOW (ref >90)
GFR calc non Af Amer: 31 mL/min — ABNORMAL LOW (ref >90)
Glucose, Bld: 185 mg/dL — ABNORMAL HIGH (ref 70–99)
Sodium: 136 mEq/L (ref 135–145)
Total Bilirubin: 0.4 mg/dL (ref 0.3–1.2)
Total Protein: 7.8 g/dL (ref 6.0–8.3)

## 2013-07-26 LAB — LIPASE, BLOOD: Lipase: 49 U/L (ref 11–59)

## 2013-07-26 NOTE — ED Notes (Addendum)
Pt. reports mid abdominal pain with nausea onset this evening after eating dinner . Pt. also reported constipation last BM Sunday . Hypertensive at triage .

## 2013-07-26 NOTE — ED Notes (Signed)
Pt transported from triage to radiology.

## 2013-07-27 LAB — URINALYSIS, ROUTINE W REFLEX MICROSCOPIC
Leukocytes, UA: NEGATIVE
Nitrite: NEGATIVE
Protein, ur: 100 mg/dL — AB
Specific Gravity, Urine: 1.012 (ref 1.005–1.030)
Urobilinogen, UA: 1 mg/dL (ref 0.0–1.0)

## 2013-07-27 LAB — PRO B NATRIURETIC PEPTIDE: Pro B Natriuretic peptide (BNP): 1267 pg/mL — ABNORMAL HIGH (ref 0–450)

## 2013-07-27 LAB — URINE MICROSCOPIC-ADD ON

## 2013-07-27 MED ORDER — POLYETHYLENE GLYCOL 3350 17 G PO PACK: 17.0000 g | PACK | Freq: Every day | ORAL | Status: DC

## 2013-07-27 MED ORDER — ONDANSETRON HCL 4 MG PO TABS: 4.0000 mg | ORAL_TABLET | Freq: Four times a day (QID) | ORAL | Status: DC

## 2013-07-27 MED ORDER — GI COCKTAIL ~~LOC~~
30.0000 mL | Freq: Once | ORAL | Status: AC
  Administered 2013-07-27: 30 mL via ORAL
  Filled 2013-07-27: qty 30

## 2013-07-27 MED ORDER — POLYETHYLENE GLYCOL 3350 17 G PO PACK
17.0000 g | PACK | Freq: Once | ORAL | Status: AC
  Administered 2013-07-27: 17 g via ORAL
  Filled 2013-07-27: qty 1

## 2013-07-27 NOTE — ED Provider Notes (Signed)
CSN: [id number]     Arrival date & time 07/26/13  2225 History   First MD Initiated Contact with Patient 07/27/13 0016     Chief Complaint  Patient presents with  . Abdominal Pain   (Consider location/radiation/quality/duration/timing/severity/associated sxs/prior Treatment) The history is provided by the patient. No language interpreter was used.  [name] is a 79 y/o M with PMHx of narcolepsy, HTN, BPH, arthritis, DM, herpes, dyslipidemia, chronic kidney disease, anemia, CAD, afib presenting to the ED with abdominal pain. Patient reported that he has been having abdominal pain starting today. Patient reported that his abdomen feels "hard" and stated that his abdomen has "swelled up like a basketball." Patient reported that he has been feeling nauseous - denied emesis. Patient reported that he has not had a bowel movement since Saturday. Patient reported that he noticed that his blood pressure has been high, when asked whether or not the patient has been taking his medications, patient reported that he has not been taking his medications. Reported that he has not taken any of his medications in months. Patient reported that he has not taken anything for the abdominal discomfort and for the constipation. Denied fever, chills, chest pain, shortness of breath, difficulty breathing, changes to appetite, urinary issues, blurred vision, visual changes/distortions, neck pain, neck stiffness, difficulty swallowing, headaches. Patient reported that he has an appointment with Dr. Susann Givens today. PCP Dr. Susann Givens  Past Medical History  Diagnosis Date  . Smoker   . Narcolepsy   . Hypertension   . BPH (benign prostatic hyperplasia)   . Arthritis   . Diabetes mellitus   . Neuropathy in diabetes   . Herpes   . Dyslipidemia   . Obesity   . Lymphedema     LE lymphedemia with hx of cellulitis.  . CKD (chronic kidney disease)   . Anemia   . History of GI bleed 06/2012    EGD unremarkable;  Colo  with large cecal polyp (bx benign)  . Ischemic cardiomyopathy     Echo 06/19/12: EF 40-45%, mid to distal anteroseptal HK, moderate LAE, PASP 37  . Chronic systolic heart failure   . CAD (coronary artery disease)     a. probable underlying CAD;  b. Type 2 NSTEMi in setting of profound anemia (Hgb 3.6) from GI bleed in 06/2012 and WMA on echo;  c. med Rx due to CKD and GI bleed  . CHF (congestive heart failure)   . Atrial fibrillation    Past Surgical History  Procedure Laterality Date  . Leg surgery      Left leg surgery. broken leg  . Esophagogastroduodenoscopy  06/19/2012    Procedure: ESOPHAGOGASTRODUODENOSCOPY (EGD);  Surgeon: Theda Belfast, MD;  Location: [facility] ENDOSCOPY;  Service: Endoscopy;  Laterality: N/A;  . Colonoscopy  06/21/2012    Procedure: COLONOSCOPY;  Surgeon: Charna Elizabeth, MD;  Location: [facility] ENDOSCOPY;  Service: Endoscopy;  Laterality: N/A;   No family history on file. History  Substance Use Topics  . Smoking status: Former Smoker    Quit date: 08/26/1998  . Smokeless tobacco: Never Used  . Alcohol Use: Not on file    Review of Systems  Constitutional: Negative for fever and chills.  Respiratory: Negative for chest tightness and shortness of breath.   Cardiovascular: Positive for leg swelling. Negative for chest pain.  Gastrointestinal: Positive for nausea, abdominal pain and constipation. Negative for vomiting, diarrhea, blood in stool and anal bleeding.  Neurological: Negative for dizziness and weakness.  All other systems  reviewed and are negative.    Allergies  Review of patient's allergies indicates no known allergies.  Home Medications   Current Outpatient Rx  Name  Route  Sig  Dispense  Refill  . ferrous sulfate 325 (65 FE) MG tablet   Oral   Take 325 mg by mouth 2 (two) times daily.         . furosemide (LASIX) 40 MG tablet   Oral   Take 40 mg by mouth 2 (two) times daily.         Marland Kitchen glipiZIDE (GLUCOTROL) 5 MG tablet   Oral   Take 5 mg  by mouth 2 (two) times daily before a meal.         . hydrALAZINE (APRESOLINE) 25 MG tablet   Oral   Take 12.5 mg by mouth 3 (three) times daily.         . hydrocerin (EUCERIN) CREA   Topical   Apply 1 application topically every morning. Apply to lower extremities         . labetalol (NORMODYNE) 200 MG tablet   Oral   Take 200 mg by mouth 2 (two) times daily.         Marland Kitchen lisinopril (PRINIVIL,ZESTRIL) 10 MG tablet   Oral   Take 10 mg by mouth every morning.         Marland Kitchen omeprazole (PRILOSEC) 20 MG capsule   Oral   Take 40 mg by mouth every morning.          Bertram Gala Glycol-Propyl Glycol (SYSTANE) 0.4-0.3 % SOLN   Both Eyes   Place 1 drop into both eyes 3 (three) times daily.         . pravastatin (PRAVACHOL) 10 MG tablet   Oral   Take 10 mg by mouth every evening.         . ondansetron (ZOFRAN) 4 MG tablet   Oral   Take 1 tablet (4 mg total) by mouth every 6 (six) hours.   12 tablet   0   . polyethylene glycol (MIRALAX / GLYCOLAX) packet   Oral   Take 17 g by mouth daily.   14 each   0    BP 231/77  Pulse 73  Temp(Src) 98.4 F (36.9 C) (Oral)  Resp 15  SpO2 99% Physical Exam  Nursing note and vitals reviewed. Constitutional: He is oriented to person, place, and time. He appears well-developed and well-nourished. No distress.  HENT:  Head: Normocephalic and atraumatic.  Mouth/Throat: Oropharynx is clear and moist. No oropharyngeal exudate.  Eyes: Conjunctivae and EOM are normal. Pupils are equal, round, and reactive to light. Right eye exhibits no discharge. Left eye exhibits no discharge.  Neck: Normal range of motion. Neck supple. No tracheal deviation present.  Cardiovascular: Normal rate, regular rhythm and normal heart sounds.  Exam reveals no friction rub.   No murmur heard. Pulses:      Radial pulses are 2+ on the right side, and 2+ on the left side.       Dorsalis pedis pulses are 2+ on the right side, and 2+ on the left side.   Bilateral leg and foot swelling identified Negative pitting edema Feet are currently wrapped in Ace bandage  Pulmonary/Chest: Effort normal and breath sounds normal. No respiratory distress. He has no wheezes. He has no rales.  Abdominal: Soft. Bowel sounds are normal. There is no tenderness. There is no guarding.  Musculoskeletal: Normal range of motion.  Lymphadenopathy:  He has no cervical adenopathy.  Neurological: He is alert and oriented to person, place, and time. He exhibits normal muscle tone. Coordination normal.  Skin: Skin is warm and dry. No rash noted. He is not diaphoretic. No erythema.  Psychiatric: He has a normal mood and affect. His behavior is normal. Thought content normal.    ED Course  Procedures (including critical care time)  This provider reviewed the patient's chart. Patient was recently seen in the ED on 07/21/2013 for leg swelling and was admitted to the hospital regarding peripheral edema. Patient was discharged on 11/30/214. Patient has a long history of poor medical compliance. Patient was in a nursing home at first but signed himself out, he now sleeps on the couch of his daughter's house.   1:06 AM Discussed case, history, presentation with Dr. Jenean Lindau. Attending physician did not recommend any further work-up to be performed. Recommended that patient be given GI cocktail and Miralax. Attending physician to see and assess patient.   2:14 AM Patient seen and assessed by attending physician. Dr. Jenean Lindau - recommended that patient be discharged.   Results for orders placed during the hospital encounter of 07/26/13  CBC WITH DIFFERENTIAL      Result Value Range   WBC 3.1 (*) 4.0 - 10.5 K/uL   RBC 3.86 (*) 4.22 - 5.81 MIL/uL   Hemoglobin 11.0 (*) 13.0 - 17.0 g/dL   HCT 57.8 (*) 46.9 - 62.9 %   MCV 86.3  78.0 - 100.0 fL   MCH 28.5  26.0 - 34.0 pg   MCHC 33.0  30.0 - 36.0 g/dL   RDW 52.8  41.3 - 24.4 %   Platelets 101 (*) 150 - 400 K/uL   Neutrophils  Relative % 48  43 - 77 %   Neutro Abs 1.5 (*) 1.7 - 7.7 K/uL   Lymphocytes Relative 20  12 - 46 %   Lymphs Abs 0.6 (*) 0.7 - 4.0 K/uL   Monocytes Relative 31 (*) 3 - 12 %   Monocytes Absolute 1.0  0.1 - 1.0 K/uL   Eosinophils Relative 1  0 - 5 %   Eosinophils Absolute 0.0  0.0 - 0.7 K/uL   Basophils Relative 0  0 - 1 %   Basophils Absolute 0.0  0.0 - 0.1 K/uL  COMPREHENSIVE METABOLIC PANEL      Result Value Range   Sodium 136  135 - 145 mEq/L   Potassium 4.3  3.5 - 5.1 mEq/L   Chloride 100  96 - 112 mEq/L   CO2 25  19 - 32 mEq/L   Glucose, Bld 185 (*) 70 - 99 mg/dL   BUN 33 (*) 6 - 23 mg/dL   Creatinine, Ser 0.10 (*) 0.50 - 1.35 mg/dL   Calcium 8.9  8.4 - 27.2 mg/dL   Total Protein 7.8  6.0 - 8.3 g/dL   Albumin 4.0  3.5 - 5.2 g/dL   AST 10  0 - 37 U/L   ALT 9  0 - 53 U/L   Alkaline Phosphatase 73  39 - 117 U/L   Total Bilirubin 0.4  0.3 - 1.2 mg/dL   GFR calc non Af Amer 31 (*) >90 mL/min   GFR calc Af Amer 36 (*) >90 mL/min  LIPASE, BLOOD      Result Value Range   Lipase 49  11 - 59 U/L  URINALYSIS, ROUTINE W REFLEX MICROSCOPIC      Result Value Range   Color, Urine YELLOW  YELLOW   APPearance CLEAR  CLEAR   Specific Gravity, Urine 1.012  1.005 - 1.030   pH 7.5  5.0 - 8.0   Glucose, UA 250 (*) NEGATIVE mg/dL   Hgb urine dipstick MODERATE (*) NEGATIVE   Bilirubin Urine NEGATIVE  NEGATIVE   Ketones, ur NEGATIVE  NEGATIVE mg/dL   Protein, ur 696 (*) NEGATIVE mg/dL   Urobilinogen, UA 1.0  0.0 - 1.0 mg/dL   Nitrite NEGATIVE  NEGATIVE   Leukocytes, UA NEGATIVE  NEGATIVE  PRO B NATRIURETIC PEPTIDE      Result Value Range   Pro B Natriuretic peptide (BNP) 1267.0 (*) 0 - 450 pg/mL  URINE MICROSCOPIC-ADD ON      Result Value Range   Squamous Epithelial / LPF RARE  RARE   RBC / HPF 11-20  <3 RBC/hpf   Bacteria, UA RARE  RARE  POCT I-STAT TROPONIN I      Result Value Range   Troponin i, poc 0.03  0.00 - 0.08 ng/mL   Comment 3           CG4 I-STAT (LACTIC ACID)       Result Value Range   Lactic Acid, Venous 1.23  0.5 - 2.2 mmol/L   Dg Chest 2 View  07/21/2013   CLINICAL DATA:  Bilateral leg swelling.  EXAM: CHEST  2 VIEW  COMPARISON:  05/03/2013  FINDINGS: The cardiac silhouette remains mildly enlarged, unchanged. There is mild central pulmonary vascular congestion without evidence of overt edema. No airspace consolidation, pleural effusion, or pneumothorax is identified. There is mild lower thoracic dextroscoliosis. Multilevel disc space narrowing and mild osteophytosis are noted in the thoracic spine.  IMPRESSION: Mild central pulmonary vascular congestion. No evidence of pulmonary edema or pneumonia.   Electronically Signed   By: Sebastian Ache   On: 07/21/2013 07:36   Dg Abd 1 View  07/26/2013   CLINICAL DATA:  Abdominal pain and vomiting.  EXAM: ABDOMEN - 1 VIEW  COMPARISON:  07/23/2013  FINDINGS: Two supine views. These demonstrate a nonobstructive bowel gas pattern. Probable cardiomegaly. No pneumatosis or free intraperitoneal air. Vascular calcifications.  IMPRESSION: No acute findings.   Electronically Signed   By: Jeronimo Greaves M.D.   On: 07/26/2013 23:58   Dg Abd 2 Views  07/23/2013   CLINICAL DATA:  Abdominal pain  EXAM: ABDOMEN - 2 VIEW  COMPARISON:  06/23/2012  FINDINGS: Scattered large and small bowel gas is identified. Fecal material is seen within the colon. No free air is noted. No abnormal mass or abnormal calcifications are noted. Degenerative changes of the lumbar spine are again seen.  IMPRESSION: No acute abnormality noted.   Electronically Signed   By: Alcide Clever M.D.   On: 07/23/2013 08:49    Labs Review Labs Reviewed  CBC WITH DIFFERENTIAL - Abnormal; Notable for the following:    WBC 3.1 (*)    RBC 3.86 (*)    Hemoglobin 11.0 (*)    HCT 33.3 (*)    Platelets 101 (*)    Neutro Abs 1.5 (*)    Lymphs Abs 0.6 (*)    Monocytes Relative 31 (*)    All other components within normal limits  COMPREHENSIVE METABOLIC PANEL - Abnormal;  Notable for the following:    Glucose, Bld 185 (*)    BUN 33 (*)    Creatinine, Ser 1.98 (*)    GFR calc non Af Amer 31 (*)    GFR calc Af Amer 36 (*)  All other components within normal limits  URINALYSIS, ROUTINE W REFLEX MICROSCOPIC - Abnormal; Notable for the following:    Glucose, UA 250 (*)    Hgb urine dipstick MODERATE (*)    Protein, ur 100 (*)    All other components within normal limits  PRO B NATRIURETIC PEPTIDE - Abnormal; Notable for the following:    Pro B Natriuretic peptide (BNP) 1267.0 (*)    All other components within normal limits  LIPASE, BLOOD  URINE MICROSCOPIC-ADD ON  POCT I-STAT TROPONIN I  CG4 I-STAT (LACTIC ACID)   Imaging Review Dg Abd 1 View  07/26/2013   CLINICAL DATA:  Abdominal pain and vomiting.  EXAM: ABDOMEN - 1 VIEW  COMPARISON:  07/23/2013  FINDINGS: Two supine views. These demonstrate a nonobstructive bowel gas pattern. Probable cardiomegaly. No pneumatosis or free intraperitoneal air. Vascular calcifications.  IMPRESSION: No acute findings.   Electronically Signed   By: Jeronimo Greaves M.D.   On: 07/26/2013 23:58    EKG Interpretation   None      Date: 07/27/2013  Rate: 74  Rhythm: normal sinus rhythm  QRS Axis: left  Intervals: PR prolonged  ST/T Wave abnormalities: normal  Conduction Disutrbances:none  Narrative Interpretation:   Old EKG Reviewed: unchanged EKG analyzed and reviewed by attending physician.    MDM   1. Abdominal pain   2. Medical non-compliance    Medications  gi cocktail (Maalox,Lidocaine,Donnatal) (30 mLs Oral Given 07/27/13 0121)  polyethylene glycol (MIRALAX / GLYCOLAX) packet 17 g (17 g Oral Given 07/27/13 0121)   Filed Vitals:   07/26/13 2345 07/27/13 0001 07/27/13 0045 07/27/13 0230  BP: 249/84 231/84 235/77 231/77  Pulse: 80  73 73  Temp:      TempSrc:      Resp: 11 19 15 15   SpO2: 99% 99% 98% 99%   Patient presenting to the ED with abdominal pain and constipation. Reported that the last time he  had a BM was on Saturday.  Alert and oriented. GCS 15. Heart rate and rhythm normal. Lungs clear to auscultation bilaterally to upper and lower lobes. Pulses palpable and strong, radial and DP 2+ bilaterally. Feet swelling noted bilaterally - negative pitting edema noted - patient currently has Ace bandage wrapped around his feet. Strength intact with resistance applied, equal distribution noted. Bowel sounds normal active in all 4 quadrants, negative pain upon palpation, negative distention noted. Negative fluid wave. Negative acute abnormalities. Negative acute abdomen, negative peritoneal signs. Nonsurgical abdomen noted. EKG negative ischemic findings noted - no new changes. Negative elevation in first troponin. CBC noted decrease in WBC - 3.1 - which is a trend that seen when compared to other labs. CMP noted elevated BUN (33) and Creatinine (1.98) - renal insufficiency. Lipase negative elevation noted. Negative elevation noted to the lactic acid. BNP noted elevation - 1267 - has decreased when compared to the level taken 5 days ago (1758).  CMP - patient not currently in DKA, anion gap 11.0 mEq/L. UA negative for infection - negative nitrites and leukocytes noted. Mild hematuria noted with RBC of 11-20 noted. Abdominal plain film negative acute findings noted.  Patient given gi cocktail and miralax in ED setting. Abdominal pain has subsided. Dr. Jenean Lindau saw and assessed patient, spoke with patient. As per attending physician, recommended patient to be discharged since patient has a long history of poor medical compliance and has not taken medications as instructed. Patient stable, afebrile. Elevated blood pressure noted, but patient has not taken  medications for chronic issues - discussed importance of taking medications with patient and being medically compliant - patient voiced understanding and agreed. Systolic elevated while in ED setting. Patient discharged. Discharged patient with zofran and miralax.  Discussed with patient to take medications as prescribed and to keep appointment with Dr. Susann Givens. Discussed with patient the importance of medical compliance and the complications that lead up to not taking medications - discussed that if he does not take his medications like he is instructed to do this can lead to death - patient understand and voiced understanding. Discussed with patient to continue to monitor symptoms and if symptoms are to worsen or change to report back to the ED - strict return instructions given.  Patient agreed to plan of care, understood, all questions answered.   Raymon Mutton, PA-C 07/27/13 1523

## 2013-07-27 NOTE — ED Provider Notes (Signed)
Medical screening examination/treatment/procedure(s) were conducted as a shared visit with non-physician practitioner(s) and myself.  I personally evaluated the patient during the encounter.  Please see my note from this date.  Olivia Mackie, MD 07/27/13 [phone]

## 2013-07-27 NOTE — ED Provider Notes (Signed)
Pt seen with PA.  Pt discharged yesterday for hypertensive urgency, refused medications and has h/o same.  Presents today with diffuse abd pain, reported n/v.  Pt still hypertensive.  No n/v here, tolerated gi coctail.  Requesting to stay in the ER as he is tired of sleeping on his daughter's couch.  Pt to be d/c with zofran, miralax, and again strongly encouraged to take his medications to manage his health problems.    Olivia Mackie, MD 07/27/13 [phone]

## 2013-07-27 NOTE — ED Notes (Signed)
PA at bedside.

## 2013-10-07 ENCOUNTER — Ambulatory Visit (INDEPENDENT_AMBULATORY_CARE_PROVIDER_SITE_OTHER): Payer: Medicare Other | Admitting: Family Medicine

## 2013-10-07 ENCOUNTER — Telehealth: Payer: Self-pay | Admitting: Internal Medicine

## 2013-10-07 ENCOUNTER — Encounter: Payer: Self-pay | Admitting: Family Medicine

## 2013-10-07 VITALS — BP 140/90 | HR 80 | Wt 238.0 lb

## 2013-10-07 DIAGNOSIS — Z9119 Patient's noncompliance with other medical treatment and regimen: Secondary | ICD-10-CM

## 2013-10-07 DIAGNOSIS — Z91199 Patient's noncompliance with other medical treatment and regimen due to unspecified reason: Secondary | ICD-10-CM

## 2013-10-07 DIAGNOSIS — E119 Type 2 diabetes mellitus without complications: Secondary | ICD-10-CM

## 2013-10-07 DIAGNOSIS — Z7409 Other reduced mobility: Secondary | ICD-10-CM

## 2013-10-07 DIAGNOSIS — I10 Essential (primary) hypertension: Secondary | ICD-10-CM

## 2013-10-07 DIAGNOSIS — R69 Illness, unspecified: Secondary | ICD-10-CM

## 2013-10-07 DIAGNOSIS — I890 Lymphedema, not elsewhere classified: Secondary | ICD-10-CM

## 2013-10-07 LAB — CBC WITH DIFFERENTIAL/PLATELET
BASOS ABS: 0 10*3/uL (ref 0.0–0.1)
Basophils Relative: 0 % (ref 0–1)
EOS PCT: 1 % (ref 0–5)
Eosinophils Absolute: 0 10*3/uL (ref 0.0–0.7)
HCT: 30.5 % — ABNORMAL LOW (ref 39.0–52.0)
Hemoglobin: 10.3 g/dL — ABNORMAL LOW (ref 13.0–17.0)
Lymphocytes Relative: 31 % (ref 12–46)
Lymphs Abs: 1 10*3/uL (ref 0.7–4.0)
MCH: 27.5 pg (ref 26.0–34.0)
MCHC: 33.8 g/dL (ref 30.0–36.0)
MCV: 81.6 fL (ref 78.0–100.0)
Monocytes Absolute: 0.9 10*3/uL (ref 0.1–1.0)
Monocytes Relative: 27 % — ABNORMAL HIGH (ref 3–12)
Neutro Abs: 1.3 10*3/uL — ABNORMAL LOW (ref 1.7–7.7)
Neutrophils Relative %: 41 % — ABNORMAL LOW (ref 43–77)
Platelets: 143 10*3/uL — ABNORMAL LOW (ref 150–400)
RBC: 3.74 MIL/uL — ABNORMAL LOW (ref 4.22–5.81)
RDW: 14.9 % (ref 11.5–15.5)
WBC: 3.2 10*3/uL — AB (ref 4.0–10.5)

## 2013-10-07 LAB — COMPREHENSIVE METABOLIC PANEL
ALK PHOS: 67 U/L (ref 39–117)
ALT: <8 U/L (ref 0–53)
AST: 8 U/L (ref 0–37)
Albumin: 4 g/dL (ref 3.5–5.2)
BUN: 15 mg/dL (ref 6–23)
CO2: 27 mEq/L (ref 19–32)
CREATININE: 1.56 mg/dL — AB (ref 0.50–1.35)
Calcium: 9 mg/dL (ref 8.4–10.5)
Chloride: 103 mEq/L (ref 96–112)
Glucose, Bld: 165 mg/dL — ABNORMAL HIGH (ref 70–99)
Potassium: 4.5 mEq/L (ref 3.5–5.3)
Sodium: 138 mEq/L (ref 135–145)
Total Bilirubin: 0.7 mg/dL (ref 0.2–1.2)
Total Protein: 6.9 g/dL (ref 6.0–8.3)

## 2013-10-07 LAB — HEMOGLOBIN A1C
Hgb A1c MFr Bld: 7 % — ABNORMAL HIGH (ref <5.7)
Mean Plasma Glucose: 154 mg/dL — ABNORMAL HIGH (ref <117)
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
## 2013-10-07 NOTE — Patient Instructions (Signed)
Go to the drug store and start taking the medicines again

## 2013-10-07 NOTE — Progress Notes (Signed)
   Subjective:    Patient ID: [name], male    DOB: [birth year], 80 y.o.   MRN: [id number]  HPI He is here for an evaluation. When asked why he is here, he starts talking about me helping him with his finances. I asked on several occasions how it can help. Every time I ask a question he will give me an answer on related to the question. He is not on any medications. He told my nurse that he does not have diabetes. He states he can get to the drugstore but when asked to go get his medicines and started on them again he would stare blankly at me. He then start talking about his legs. Apparently he has had a dressing on his legs for at least several months. He is apparently living with his daughter and states that it is filthy with roaches and dog infested. View of the record indicates he was in a nursing facility for a while and  left on his own. While he was there he refused the care that they wanted to give him. When asked why he refused again he had circuitous answers. He was seen in December in the emergency room for evaluation of his legs. Apparently a dressing was placed and he says that he has not removed the dressing since then.   Review of Systems     Objective:   Physical Exam  alert and in no distress. The dressing on his lower legs was removed and did show scaling, dryness, major pitting but no evidence of infection. A photo was taken with my cell phone or at an attempt to get a hemoglobin A1c was taken and it would not register due to questionable low hemoglobin.       Assessment & Plan:  Type II or unspecified type diabetes mellitus without mention of complication, not stated as uncontrolled - Plan: CBC with Differential, Comprehensive metabolic panel, Hemoglobin A1c, CANCELED: POCT glycosylated hemoglobin (Hb A1C)  Personal history of noncompliance with medical treatment, presenting hazards to health - Plan: CBC with Differential, Comprehensive metabolic panel, Hemoglobin  A1c  Chronic acquired lymphedema  Hypertension - Plan: CBC with Differential, Comprehensive metabolic panel  Impaired mobility and ADLs  It is clear from my interaction with him that he is not competent to take care of himself. He has proven this by leaving the nursing home, stopping all his medications, not taking care of his lower extremities to any extent. I had my CMA  call [facility] to report this to do an evaluation. I will work with social services in any way I can to get him taken care of appropriately.

## 2013-10-08 NOTE — Progress Notes (Signed)
Called pt to give results, he did not answer the phone and his mailbox is full. Will try again Monday.

## 2013-10-11 ENCOUNTER — Encounter: Payer: Self-pay | Admitting: Internal Medicine

## 2013-10-13 ENCOUNTER — Ambulatory Visit: Payer: Medicare Other | Admitting: Podiatry

## 2013-10-14 ENCOUNTER — Ambulatory Visit (INDEPENDENT_AMBULATORY_CARE_PROVIDER_SITE_OTHER): Payer: Medicare Other | Admitting: Podiatry

## 2013-10-14 ENCOUNTER — Encounter: Payer: Self-pay | Admitting: Podiatry

## 2013-10-14 VITALS — BP 140/92 | HR 98 | Resp 12

## 2013-10-14 DIAGNOSIS — B351 Tinea unguium: Secondary | ICD-10-CM

## 2013-10-14 DIAGNOSIS — M79609 Pain in unspecified limb: Secondary | ICD-10-CM

## 2013-10-14 NOTE — Progress Notes (Signed)
[name] presents today as a patient of Dr. Delfino Lovett with a chief complaint of painful E. elongated toenails.  Objective: Vital signs are stable he is alert and oriented x3 pulses are palpable bilateral. No calf pain dry xerotic skin plantar aspect of the bilateral foot medial aspect of the bilateral foot. Nails are thick yellow dystrophic clinically mycotic and painful palpation as well as debridement.  Assessment: Pain in limb secondary to onychomycosis 1 through 5 bilateral.  Plan: Debridement of nails 1 through 5 bilateral is a covered service secondary to pain.

## 2013-11-10 ENCOUNTER — Encounter (HOSPITAL_COMMUNITY): Payer: Self-pay | Admitting: Emergency Medicine

## 2013-11-10 ENCOUNTER — Telehealth: Payer: Self-pay | Admitting: Family Medicine

## 2013-11-10 ENCOUNTER — Ambulatory Visit: Payer: Medicare Other | Admitting: Family Medicine

## 2013-11-10 ENCOUNTER — Emergency Department (HOSPITAL_COMMUNITY)
Admission: EM | Admit: 2013-11-10 | Discharge: 2013-11-10 | Discharge status: Against Medical Advice | Payer: Medicare Other | Source: Home / Self Care

## 2013-11-10 DIAGNOSIS — E669 Obesity, unspecified: Secondary | ICD-10-CM

## 2013-11-10 DIAGNOSIS — N189 Chronic kidney disease, unspecified: Secondary | ICD-10-CM | POA: Insufficient documentation

## 2013-11-10 DIAGNOSIS — I129 Hypertensive chronic kidney disease with stage 1 through stage 4 chronic kidney disease, or unspecified chronic kidney disease: Secondary | ICD-10-CM | POA: Insufficient documentation

## 2013-11-10 DIAGNOSIS — I509 Heart failure, unspecified: Secondary | ICD-10-CM

## 2013-11-10 DIAGNOSIS — I2510 Atherosclerotic heart disease of native coronary artery without angina pectoris: Secondary | ICD-10-CM

## 2013-11-10 DIAGNOSIS — E119 Type 2 diabetes mellitus without complications: Secondary | ICD-10-CM

## 2013-11-10 DIAGNOSIS — L02419 Cutaneous abscess of limb, unspecified: Secondary | ICD-10-CM

## 2013-11-10 DIAGNOSIS — L03119 Cellulitis of unspecified part of limb: Secondary | ICD-10-CM

## 2013-11-10 NOTE — ED Notes (Signed)
Per pt sts LLE swelling and redness x 1 week.

## 2013-11-10 NOTE — Telephone Encounter (Signed)
lm

## 2013-11-12 ENCOUNTER — Encounter (HOSPITAL_COMMUNITY): Payer: Self-pay | Admitting: Emergency Medicine

## 2013-11-12 ENCOUNTER — Inpatient Hospital Stay (HOSPITAL_COMMUNITY)
Admission: EM | Admit: 2013-11-12 | Discharge: 2013-11-17 | DRG: 293 | Disposition: A | Discharge status: Home / Self Care | Payer: Medicare Other | Attending: Internal Medicine | Admitting: Internal Medicine

## 2013-11-12 ENCOUNTER — Telehealth: Payer: Self-pay | Admitting: Family Medicine

## 2013-11-12 ENCOUNTER — Inpatient Hospital Stay (HOSPITAL_COMMUNITY): Payer: Medicare Other

## 2013-11-12 DIAGNOSIS — D72819 Decreased white blood cell count, unspecified: Secondary | ICD-10-CM

## 2013-11-12 DIAGNOSIS — E1142 Type 2 diabetes mellitus with diabetic polyneuropathy: Secondary | ICD-10-CM | POA: Diagnosis present

## 2013-11-12 DIAGNOSIS — N400 Enlarged prostate without lower urinary tract symptoms: Secondary | ICD-10-CM

## 2013-11-12 DIAGNOSIS — I5033 Acute on chronic diastolic (congestive) heart failure: Secondary | ICD-10-CM

## 2013-11-12 DIAGNOSIS — D649 Anemia, unspecified: Secondary | ICD-10-CM

## 2013-11-12 DIAGNOSIS — I890 Lymphedema, not elsewhere classified: Secondary | ICD-10-CM

## 2013-11-12 DIAGNOSIS — I10 Essential (primary) hypertension: Secondary | ICD-10-CM

## 2013-11-12 DIAGNOSIS — I160 Hypertensive urgency: Secondary | ICD-10-CM

## 2013-11-12 DIAGNOSIS — Z91199 Patient's noncompliance with other medical treatment and regimen due to unspecified reason: Secondary | ICD-10-CM

## 2013-11-12 DIAGNOSIS — E1149 Type 2 diabetes mellitus with other diabetic neurological complication: Secondary | ICD-10-CM | POA: Diagnosis present

## 2013-11-12 DIAGNOSIS — I5023 Acute on chronic systolic (congestive) heart failure: Principal | ICD-10-CM | POA: Diagnosis present

## 2013-11-12 DIAGNOSIS — E785 Hyperlipidemia, unspecified: Secondary | ICD-10-CM | POA: Diagnosis present

## 2013-11-12 DIAGNOSIS — N183 Chronic kidney disease, stage 3 unspecified: Secondary | ICD-10-CM | POA: Diagnosis present

## 2013-11-12 DIAGNOSIS — R7401 Elevation of levels of liver transaminase levels: Secondary | ICD-10-CM

## 2013-11-12 DIAGNOSIS — Z9114 Patient's other noncompliance with medication regimen: Secondary | ICD-10-CM

## 2013-11-12 DIAGNOSIS — D72829 Elevated white blood cell count, unspecified: Secondary | ICD-10-CM

## 2013-11-12 DIAGNOSIS — Z9119 Patient's noncompliance with other medical treatment and regimen: Secondary | ICD-10-CM

## 2013-11-12 DIAGNOSIS — D62 Acute posthemorrhagic anemia: Secondary | ICD-10-CM

## 2013-11-12 DIAGNOSIS — K635 Polyp of colon: Secondary | ICD-10-CM

## 2013-11-12 DIAGNOSIS — I252 Old myocardial infarction: Secondary | ICD-10-CM

## 2013-11-12 DIAGNOSIS — I509 Heart failure, unspecified: Secondary | ICD-10-CM

## 2013-11-12 DIAGNOSIS — I2589 Other forms of chronic ischemic heart disease: Secondary | ICD-10-CM | POA: Diagnosis present

## 2013-11-12 DIAGNOSIS — K922 Gastrointestinal hemorrhage, unspecified: Secondary | ICD-10-CM

## 2013-11-12 DIAGNOSIS — I2510 Atherosclerotic heart disease of native coronary artery without angina pectoris: Secondary | ICD-10-CM | POA: Diagnosis present

## 2013-11-12 DIAGNOSIS — Z01810 Encounter for preprocedural cardiovascular examination: Secondary | ICD-10-CM

## 2013-11-12 DIAGNOSIS — N289 Disorder of kidney and ureter, unspecified: Secondary | ICD-10-CM | POA: Diagnosis not present

## 2013-11-12 DIAGNOSIS — M549 Dorsalgia, unspecified: Secondary | ICD-10-CM

## 2013-11-12 DIAGNOSIS — R609 Edema, unspecified: Secondary | ICD-10-CM

## 2013-11-12 DIAGNOSIS — G47419 Narcolepsy without cataplexy: Secondary | ICD-10-CM

## 2013-11-12 DIAGNOSIS — E119 Type 2 diabetes mellitus without complications: Secondary | ICD-10-CM

## 2013-11-12 DIAGNOSIS — I129 Hypertensive chronic kidney disease with stage 1 through stage 4 chronic kidney disease, or unspecified chronic kidney disease: Secondary | ICD-10-CM | POA: Diagnosis present

## 2013-11-12 DIAGNOSIS — E669 Obesity, unspecified: Secondary | ICD-10-CM | POA: Diagnosis present

## 2013-11-12 DIAGNOSIS — I4891 Unspecified atrial fibrillation: Secondary | ICD-10-CM | POA: Diagnosis present

## 2013-11-12 DIAGNOSIS — Z87891 Personal history of nicotine dependence: Secondary | ICD-10-CM

## 2013-11-12 DIAGNOSIS — D509 Iron deficiency anemia, unspecified: Secondary | ICD-10-CM | POA: Diagnosis present

## 2013-11-12 DIAGNOSIS — R740 Nonspecific elevation of levels of transaminase and lactic acid dehydrogenase [LDH]: Secondary | ICD-10-CM

## 2013-11-12 LAB — CBC WITH DIFFERENTIAL/PLATELET
BASOS ABS: 0 10*3/uL (ref 0.0–0.1)
Basophils Relative: 0 % (ref 0–1)
Eosinophils Absolute: 0 10*3/uL (ref 0.0–0.7)
Eosinophils Relative: 1 % (ref 0–5)
HEMATOCRIT: 27.1 % — AB (ref 39.0–52.0)
HEMOGLOBIN: 8.7 g/dL — AB (ref 13.0–17.0)
LYMPHS PCT: 24 % (ref 12–46)
Lymphs Abs: 0.9 10*3/uL (ref 0.7–4.0)
MCH: 27.1 pg (ref 26.0–34.0)
MCHC: 32.1 g/dL (ref 30.0–36.0)
MCV: 84.4 fL (ref 78.0–100.0)
MONO ABS: 0.7 10*3/uL (ref 0.1–1.0)
Monocytes Relative: 19 % — ABNORMAL HIGH (ref 3–12)
Neutro Abs: 2.1 10*3/uL (ref 1.7–7.7)
Neutrophils Relative %: 57 % (ref 43–77)
Platelets: 235 10*3/uL (ref 150–400)
RBC: 3.21 MIL/uL — ABNORMAL LOW (ref 4.22–5.81)
RDW: 14.9 % (ref 11.5–15.5)
WBC: 3.6 10*3/uL — AB (ref 4.0–10.5)

## 2013-11-12 LAB — COMPREHENSIVE METABOLIC PANEL
ALT: 8 U/L (ref 0–53)
AST: 9 U/L (ref 0–37)
Albumin: 3.3 g/dL — ABNORMAL LOW (ref 3.5–5.2)
Alkaline Phosphatase: 74 U/L (ref 39–117)
BILIRUBIN TOTAL: 0.3 mg/dL (ref 0.3–1.2)
BUN: 17 mg/dL (ref 6–23)
CALCIUM: 8.5 mg/dL (ref 8.4–10.5)
CHLORIDE: 105 meq/L (ref 96–112)
CO2: 28 mEq/L (ref 19–32)
CREATININE: 1.46 mg/dL — AB (ref 0.50–1.35)
GFR calc Af Amer: 52 mL/min — ABNORMAL LOW (ref >90)
GFR, EST NON AFRICAN AMERICAN: 45 mL/min — AB (ref >90)
GLUCOSE: 155 mg/dL — AB (ref 70–99)
Potassium: 4.2 mEq/L (ref 3.7–5.3)
Sodium: 143 mEq/L (ref 137–147)
Total Protein: 6.8 g/dL (ref 6.0–8.3)

## 2013-11-12 LAB — PRO B NATRIURETIC PEPTIDE: PRO B NATRI PEPTIDE: 2435 pg/mL — AB (ref 0–450)

## 2013-11-12 LAB — TROPONIN I: Troponin I: <0.3 ng/mL (ref <0.30)

## 2013-11-12 LAB — GLUCOSE, CAPILLARY: Glucose-Capillary: 201 mg/dL — ABNORMAL HIGH (ref 70–99)

## 2013-11-12 MED ORDER — LISINOPRIL 5 MG PO TABS
5.0000 mg | ORAL_TABLET | Freq: Every day | ORAL | Status: DC
  Administered 2013-11-13 – 2013-11-16 (×4): 5 mg via ORAL
  Filled 2013-11-12 (×6): qty 1

## 2013-11-12 MED ORDER — FUROSEMIDE 10 MG/ML IJ SOLN
40.0000 mg | Freq: Once | INTRAMUSCULAR | Status: AC
  Administered 2013-11-12: 40 mg via INTRAVENOUS
  Filled 2013-11-12: qty 4

## 2013-11-12 MED ORDER — INSULIN ASPART 100 UNIT/ML ~~LOC~~ SOLN: 0.0000 [IU] | Freq: Every day | SUBCUTANEOUS | Status: DC

## 2013-11-12 MED ORDER — CARVEDILOL 3.125 MG PO TABS
3.1250 mg | ORAL_TABLET | Freq: Two times a day (BID) | ORAL | Status: DC
  Administered 2013-11-13 – 2013-11-16 (×7): 3.125 mg via ORAL
  Filled 2013-11-12 (×11): qty 1

## 2013-11-12 MED ORDER — FUROSEMIDE 10 MG/ML IJ SOLN
40.0000 mg | Freq: Two times a day (BID) | INTRAMUSCULAR | Status: DC
  Administered 2013-11-12 – 2013-11-13 (×2): 40 mg via INTRAVENOUS
  Filled 2013-11-12 (×3): qty 4

## 2013-11-12 MED ORDER — INSULIN ASPART 100 UNIT/ML ~~LOC~~ SOLN
0.0000 [IU] | Freq: Three times a day (TID) | SUBCUTANEOUS | Status: DC
  Administered 2013-11-13 – 2013-11-14 (×5): 2 [IU] via SUBCUTANEOUS
  Administered 2013-11-15: 3 [IU] via SUBCUTANEOUS
  Administered 2013-11-15: 2 [IU] via SUBCUTANEOUS
  Administered 2013-11-16: 3 [IU] via SUBCUTANEOUS
  Administered 2013-11-16: 2 [IU] via SUBCUTANEOUS

## 2013-11-12 MED ORDER — SODIUM CHLORIDE 0.9 % IJ SOLN
3.0000 mL | Freq: Two times a day (BID) | INTRAMUSCULAR | Status: DC
  Administered 2013-11-12 – 2013-11-17 (×8): 3 mL via INTRAVENOUS

## 2013-11-12 MED ORDER — POTASSIUM CHLORIDE CRYS ER 10 MEQ PO TBCR
10.0000 meq | EXTENDED_RELEASE_TABLET | Freq: Two times a day (BID) | ORAL | Status: AC
  Administered 2013-11-12 – 2013-11-14 (×4): 10 meq via ORAL
  Filled 2013-11-12 (×4): qty 1

## 2013-11-12 MED ORDER — ENOXAPARIN SODIUM 30 MG/0.3ML ~~LOC~~ SOLN
30.0000 mg | SUBCUTANEOUS | Status: DC
  Administered 2013-11-13 – 2013-11-14 (×2): 30 mg via SUBCUTANEOUS
  Filled 2013-11-12 (×4): qty 0.3

## 2013-11-12 MED ORDER — SODIUM CHLORIDE 0.9 % IJ SOLN: 3.0000 mL | INTRAMUSCULAR | Status: DC | PRN

## 2013-11-12 NOTE — ED Notes (Signed)
Floor Called and updated.

## 2013-11-12 NOTE — ED Provider Notes (Signed)
CSN: [id number]     Arrival date & time 11/12/13  1156 History   First MD Initiated Contact with Patient 11/12/13 1439     Chief Complaint  Patient presents with  . Leg Swelling     (Consider location/radiation/quality/duration/timing/severity/associated sxs/prior Treatment) The history is provided by the patient.   patient presents complaining of swelling in both of his legs. Patient states he was on medications previously, but stopped them because they made his condition worse. He states his narcolepsy and if his blood pressure is normal he passes out. He denies chest pain. He states he's had some trouble breathing. He states he has to sleep upright in a chair because of his legs and some trouble breathing. Review of notes shows he has been referred to adult protective services in the past. Patient states that all his daughters wanted his money. No fevers. He states he has had some drainage of the legs. Patient states all that he wants to do his ride a motorcycle in place and cool. He states while his wife was alive he was not allowed to ride a motorcycle. He states he does not have one now but would like to get one.  Past Medical History  Diagnosis Date  . Smoker   . Narcolepsy   . Hypertension   . BPH (benign prostatic hyperplasia)   . Arthritis   . Diabetes mellitus   . Neuropathy in diabetes   . Herpes   . Dyslipidemia   . Obesity   . Lymphedema     LE lymphedemia with hx of cellulitis.  . CKD (chronic kidney disease)   . Anemia   . History of GI bleed 06/2012    EGD unremarkable;  Colo with large cecal polyp (bx benign)  . Ischemic cardiomyopathy     Echo 06/19/12: EF 40-45%, mid to distal anteroseptal HK, moderate LAE, PASP 37  . Chronic systolic heart failure   . CAD (coronary artery disease)     a. probable underlying CAD;  b. Type 2 NSTEMi in setting of profound anemia (Hgb 3.6) from GI bleed in 06/2012 and WMA on echo;  c. med Rx due to CKD and GI bleed  . CHF  (congestive heart failure)   . Atrial fibrillation    Past Surgical History  Procedure Laterality Date  . Leg surgery      Left leg surgery. broken leg  . Esophagogastroduodenoscopy  06/19/2012    Procedure: ESOPHAGOGASTRODUODENOSCOPY (EGD);  Surgeon: Beryle Beams, MD;  Location: [facility] ENDOSCOPY;  Service: Endoscopy;  Laterality: N/A;  . Colonoscopy  06/21/2012    Procedure: COLONOSCOPY;  Surgeon: Juanita Craver, MD;  Location: [facility] ENDOSCOPY;  Service: Endoscopy;  Laterality: N/A;   History reviewed. No pertinent family history. History  Substance Use Topics  . Smoking status: Former Smoker    Quit date: 08/26/1998  . Smokeless tobacco: Never Used  . Alcohol Use: No    Review of Systems  Constitutional: Negative for activity change and appetite change.  Eyes: Negative for pain.  Respiratory: Negative for chest tightness and shortness of breath.   Cardiovascular: Positive for leg swelling. Negative for chest pain.  Gastrointestinal: Negative for nausea, vomiting, abdominal pain and diarrhea.  Genitourinary: Negative for flank pain.  Musculoskeletal: Negative for back pain and neck stiffness.  Skin: Negative for rash.  Neurological: Negative for weakness, numbness and headaches.  Psychiatric/Behavioral: Negative for behavioral problems.      Allergies  Review of patient's allergies indicates no known allergies.  Home Medications  No current outpatient prescriptions on file. BP 177/67  Pulse 79  Resp 18  Ht 5' 10.5" (1.791 m)  SpO2 100% Physical Exam  Nursing note and vitals reviewed. Constitutional: He is oriented to person, place, and time. He appears well-developed and well-nourished.  HENT:  Head: Normocephalic and atraumatic.  Eyes: EOM are normal. Pupils are equal, round, and reactive to light.  Neck: Normal range of motion. Neck supple.  Cardiovascular: Normal rate, regular rhythm and normal heart sounds.   No murmur heard. Pulmonary/Chest: Effort normal.   Mildly harsh breath sounds throughout  Abdominal: Soft. Bowel sounds are normal. He exhibits no distension and no mass. There is no tenderness. There is no rebound and no guarding.  Musculoskeletal: Normal range of motion. He exhibits edema.  Bilateral low show no pitting edema, moderate to severe. There is some weeping on the left leg.  Neurological: He is alert and oriented to person, place, and time. No cranial nerve deficit.  Skin: Skin is warm.  Psychiatric: He has a normal mood and affect.    ED Course  Procedures (including critical care time) Labs Review Labs Reviewed  CBC WITH DIFFERENTIAL - Abnormal; Notable for the following:    WBC 3.6 (*)    RBC 3.21 (*)    Hemoglobin 8.7 (*)    HCT 27.1 (*)    Monocytes Relative 19 (*)    All other components within normal limits  COMPREHENSIVE METABOLIC PANEL - Abnormal; Notable for the following:    Glucose, Bld 155 (*)    Creatinine, Ser 1.46 (*)    Albumin 3.3 (*)    GFR calc non Af Amer 45 (*)    GFR calc Af Amer 52 (*)    All other components within normal limits  PRO B NATRIURETIC PEPTIDE - Abnormal; Notable for the following:    Pro B Natriuretic peptide (BNP) 2435.0 (*)    All other components within normal limits  TROPONIN I   Imaging Review No results found.   EKG Interpretation   Date/Time:  Friday November 12 2013 12:14:26 EDT Ventricular Rate:  91 PR Interval:  200 QRS Duration: 82 QT Interval:  374 QTC Calculation: 460 R Axis:   -13 Text Interpretation:  Normal sinus rhythm with sinus arrhythmia Moderate  voltage criteria for LVH, may be normal variant Prolonged QT Abnormal ECG  Confirmed by Alvino Chapel  MD, Ovid Curd [phone]) on 11/12/2013 3:39:28 PM      MDM   Final diagnoses:  CHF (congestive heart failure)  Noncompliance with medications  Anemia    Patient presented with swelling in both of his legs, his acute on chronic. He also has been having increased difficulty breathing and cannot lay flat  anymore. He has a few scattered rales. BNP is elevated. He also has an anemia, but was not eager to have a rectal exam done. He has had some issues with medication compliance. He also has had Adult Protective Services come to the house. I think the benefit from an admission at this time. He will be given IV diuretics.    Jasper Riling. Alvino Chapel, MD 11/12/13 [phone]

## 2013-11-12 NOTE — ED Notes (Signed)
MD at bedside. 

## 2013-11-12 NOTE — Telephone Encounter (Signed)
Ernest Pine social worker from [facility] [employer] brought in signed forms needing pt's medical record.  Said that this follows HIPPA guidelines & statutes.  (copy to be scanned in chart) We had referred the patient to protective services for continuity of care

## 2013-11-12 NOTE — ED Notes (Signed)
Pt here with bil leg swelling, pt had been on lasix at one time but is off of it now, pt has been having to sit up at night to help him sleep

## 2013-11-13 DIAGNOSIS — E785 Hyperlipidemia, unspecified: Secondary | ICD-10-CM

## 2013-11-13 DIAGNOSIS — I517 Cardiomegaly: Secondary | ICD-10-CM

## 2013-11-13 DIAGNOSIS — I4891 Unspecified atrial fibrillation: Secondary | ICD-10-CM

## 2013-11-13 LAB — GLUCOSE, CAPILLARY
GLUCOSE-CAPILLARY: 179 mg/dL — AB (ref 70–99)
Glucose-Capillary: 178 mg/dL — ABNORMAL HIGH (ref 70–99)
Glucose-Capillary: 151 mg/dL — ABNORMAL HIGH (ref 70–99)
Glucose-Capillary: 185 mg/dL — ABNORMAL HIGH (ref 70–99)

## 2013-11-13 LAB — BASIC METABOLIC PANEL
BUN: 21 mg/dL (ref 6–23)
CO2: 28 mEq/L (ref 19–32)
CREATININE: 1.86 mg/dL — AB (ref 0.50–1.35)
Calcium: 8.7 mg/dL (ref 8.4–10.5)
Chloride: 101 mEq/L (ref 96–112)
GFR calc Af Amer: 39 mL/min — ABNORMAL LOW (ref >90)
GFR, EST NON AFRICAN AMERICAN: 33 mL/min — AB (ref >90)
Glucose, Bld: 198 mg/dL — ABNORMAL HIGH (ref 70–99)
Potassium: 4.1 mEq/L (ref 3.7–5.3)
SODIUM: 140 meq/L (ref 137–147)

## 2013-11-13 LAB — HEMOGLOBIN A1C
Hgb A1c MFr Bld: 7.9 % — ABNORMAL HIGH (ref <5.7)
Mean Plasma Glucose: 180 mg/dL — ABNORMAL HIGH (ref <117)

## 2013-11-13 LAB — FOLATE: Folate: 9.8 ng/mL

## 2013-11-13 LAB — FERRITIN: FERRITIN: 8 ng/mL — AB (ref 22–322)

## 2013-11-13 LAB — VITAMIN B12: Vitamin B-12: 348 pg/mL (ref 211–911)

## 2013-11-13 LAB — IRON AND TIBC
Iron: 22 ug/dL — ABNORMAL LOW (ref 42–135)
Saturation Ratios: 7 % — ABNORMAL LOW (ref 20–55)
TIBC: 312 ug/dL (ref 215–435)
UIBC: 290 ug/dL (ref 125–400)

## 2013-11-13 LAB — RETICULOCYTES
RBC.: 3.31 MIL/uL — ABNORMAL LOW (ref 4.22–5.81)
Retic Count, Absolute: 43 10*3/uL (ref 19.0–186.0)
Retic Ct Pct: 1.3 % (ref 0.4–3.1)

## 2013-11-13 MED ORDER — FUROSEMIDE 10 MG/ML IJ SOLN
10.0000 mg/h | INTRAVENOUS | Status: DC
  Administered 2013-11-13 – 2013-11-15 (×2): 10 mg/h via INTRAVENOUS
  Filled 2013-11-13 (×5): qty 25

## 2013-11-13 MED ORDER — INSULIN DETEMIR 100 UNIT/ML ~~LOC~~ SOLN
5.0000 [IU] | Freq: Every day | SUBCUTANEOUS | Status: DC
  Administered 2013-11-13: 5 [IU] via SUBCUTANEOUS
  Filled 2013-11-13 (×2): qty 0.05

## 2013-11-13 MED ORDER — METOLAZONE 2.5 MG PO TABS
2.5000 mg | ORAL_TABLET | Freq: Two times a day (BID) | ORAL | Status: DC
  Administered 2013-11-13 – 2013-11-14 (×4): 2.5 mg via ORAL
  Filled 2013-11-13 (×6): qty 1

## 2013-11-13 NOTE — Progress Notes (Signed)
Pt. Arrived to unit from ED alert and oriented and in stable condition. Pt. Oriented to room and placed on telemetry. CMT notified. Pt. Resting in bed. Call light within reach. RN will continue to monitor pt. For changes in condition. Sojourner Behringer, Katherine Roan

## 2013-11-13 NOTE — H&P (Signed)
[facility] History and Physical  Zenas Santa VOJ:[id number] DOB: 10/28/1933 DOA: 11/12/2013  Referring physician:  EDP PCP: Wyatt Haste, MD  Specialists:   Chief Complaint:  SOB and increased Swelling   HPI: [name] is a 80 y.o. male who presented to the ED with complaints of increased swelling of both legs worsening over the past 2 weeks.  He has also had SOB and orthopnea and DOE.  He denies Chest pain.   He has a documented medical history of  Chronic Systolic CHF and  Chronic lymphedema, and he had been on lasix at one time but not recently.  He has a history of non-compliance with medications because he reports that the medications interfere with his ablity to function by worsening his narcolepsy.   He has Diabetes and HTN and is not on any medications.   He currently lives with one of his daughters.       Review of Systems:  Constitutional: No Weight Loss, No Weight Gain, Night Sweats, Fevers, Chills, Fatigue, or Generalized Weakness HEENT: No Headaches, Difficulty Swallowing,Tooth/Dental Problems,Sore Throat,  No Sneezing, Rhinitis, Ear Ache, Nasal Congestion, or Post Nasal Drip,  Cardio-vascular:  No Chest pain, +Orthopnea, +PND, +Edema in lower extremities, Anasarca, Dizziness, Palpitations  Resp:  +Dyspnea, +DOE, No Productive Cough, No Non-Productive Cough, No Hemoptysis, No Change in Color of Mucus, No Wheezing.    GI: No Heartburn, Indigestion, Abdominal Pain, Nausea, Vomiting, Diarrhea, Change in Bowel Habits,  Loss of Appetite  GU: No Dysuria, Change in Color of Urine, No Urgency or Frequency.  No flank pain.  Musculoskeletal: No Joint Pain or Swelling.  No Decreased Range of Motion. No Back Pain.  Neurologic: No Syncope, No Seizures, Muscle Weakness, Paresthesia, Vision Disturbance or Loss, No Diplopia, No Vertigo, No Difficulty Walking,  Skin: No Rash or Lesions. Psych: No Change in Mood or Affect. No Depression or Anxiety. No Memory loss. No  Confusion or Hallucinations   Past Medical History  Diagnosis Date  . Smoker   . Narcolepsy   . Hypertension   . BPH (benign prostatic hyperplasia)   . Arthritis   . Diabetes mellitus   . Neuropathy in diabetes   . Herpes   . Dyslipidemia   . Obesity   . Lymphedema     LE lymphedemia with hx of cellulitis.  . CKD (chronic kidney disease)   . Anemia   . History of GI bleed 06/2012    EGD unremarkable;  Colo with large cecal polyp (bx benign)  . Ischemic cardiomyopathy     Echo 06/19/12: EF 40-45%, mid to distal anteroseptal HK, moderate LAE, PASP 37  . Chronic systolic heart failure   . CAD (coronary artery disease)     a. probable underlying CAD;  b. Type 2 NSTEMi in setting of profound anemia (Hgb 3.6) from GI bleed in 06/2012 and WMA on echo;  c. med Rx due to CKD and GI bleed  . CHF (congestive heart failure)   . Atrial fibrillation       Past Surgical History  Procedure Laterality Date  . Leg surgery      Left leg surgery. broken leg  . Esophagogastroduodenoscopy  06/19/2012    Procedure: ESOPHAGOGASTRODUODENOSCOPY (EGD);  Surgeon: Beryle Beams, MD;  Location: [facility] ENDOSCOPY;  Service: Endoscopy;  Laterality: N/A;  . Colonoscopy  06/21/2012    Procedure: COLONOSCOPY;  Surgeon: Juanita Craver, MD;  Location: [facility] ENDOSCOPY;  Service: Endoscopy;  Laterality: N/A;       Prior  to Admission medications   Not on File      No Known Allergies    Social History:  reports that he quit smoking about 15 years ago. He has never used smokeless tobacco. He reports that he does not drink alcohol or use illicit drugs.     Family History:     Mother had Brain Tumor    Physical Exam:  GEN:  Angry Morbidly Obese  80 y.o. Elderly African American  male  examined  and in no acute distress; cooperative with exam Filed Vitals:   11/12/13 1903 11/12/13 1930 11/12/13 2011 11/12/13 2239  BP: 166/87 184/90 188/63 176/67  Pulse: 71 74 80 77  Temp:   97.2 F (36.2 C) 98.7 F  (37.1 C)  TempSrc:   Oral Oral  Resp: 18  18 20   Height:   5\' 9"  (1.753 m)   Weight:   110.269 kg (243 lb 1.6 oz)   SpO2: 99% 100% 100% 100%   Blood pressure 176/67, pulse 77, temperature 98.7 F (37.1 C), temperature source Oral, resp. rate 20, height 5\' 9"  (1.753 m), weight 110.269 kg (243 lb 1.6 oz), SpO2 100.00%. PSYCH: He is alert and oriented x4; does not appear anxious does not appear depressed; affect is normal HEENT: Normocephalic and Atraumatic, Mucous membranes pink; PERRLA; EOM intact; Fundi:  Benign;  No scleral icterus, Nares: Patent, Oropharynx: Clear,  Fair Dentition, Neck:  FROM, no cervical lymphadenopathy nor thyromegaly or carotid bruit; no JVD; Breasts:: Not examined CHEST WALL: No tenderness CHEST: Normal respiration, clear to auscultation bilaterally HEART: Regular rate and rhythm; no murmurs rubs or gallops BACK: No kyphosis or scoliosis; no CVA tenderness ABDOMEN: Positive Bowel Sounds, Obese, soft non-tender; no masses, no organomegaly.    Rectal Exam: Not done EXTREMITIES: 3+ EDEMA to BLEs,    R>L Genitalia: not examined PULSES: 2+ and symmetric SKIN: Normal hydration no rash or ulceration CNS:   Vascular: pulses palpable throughout    Labs on Admission:  Basic Metabolic Panel:  Recent Labs Lab 11/12/13 1455  NA 143  K 4.2  CL 105  CO2 28  GLUCOSE 155*  BUN 17  CREATININE 1.46*  CALCIUM 8.5   Liver Function Tests:  Recent Labs Lab 11/12/13 1455  AST 9  ALT 8  ALKPHOS 74  BILITOT 0.3  PROT 6.8  ALBUMIN 3.3*   No results found for this basename: LIPASE, AMYLASE,  in the last 168 hours No results found for this basename: AMMONIA,  in the last 168 hours CBC:  Recent Labs Lab 11/12/13 1455  WBC 3.6*  NEUTROABS 2.1  HGB 8.7*  HCT 27.1*  MCV 84.4  PLT 235   Cardiac Enzymes:  Recent Labs Lab 11/12/13 1455  TROPONINI <0.30    BNP (last 3 results)  Recent Labs  07/22/13 0341 07/27/13 0105 11/12/13 1455  PROBNP 1758.0*  1267.0* 2435.0*   CBG:  Recent Labs Lab 11/12/13 2236  GLUCAP 201*    Radiological Exams on Admission: Dg Chest 2 View  11/12/2013   CLINICAL DATA:  Edema  EXAM: CHEST  2 VIEW  COMPARISON:  07/21/2013  FINDINGS: Pulmonary vascular congestion, chronic. No frank interstitial edema. No pleural effusion or pneumothorax.  Mild cardiomegaly.  Degenerative changes of the visualized thoracolumbar spine.  IMPRESSION: Cardiomegaly with pulmonary vascular congestion. No frank interstitial edema   Electronically Signed   By: Julian Hy M.D.   On: 11/12/2013 18:38      EKG: Independently reviewed.  Assessment/Plan:   80 y.o. male with  Principal Problem:   CHF, acute on chronic Active Problems:   Diabetes mellitus   Hypertension   Hyperlipemia   Hyperlipidemia   CKD (chronic kidney disease), stage III   Anemia   Narcolepsy   Noncompliance with medications      1.   Acute on Chronic CHF-   Telemetry Monitoring, placed on the CHF Protocol and Diurese with IV lasix , monitor electrolytes.     2.     DM2-   Check Hba1C, and add SSI coverage,  Not on medication currently.    3.     HTN-   Uncontrolled , and not on medications per his choice,  Needs BP control, may agree to lasix due to his Edema.     4.     Hyperlipidemia- Check Fasting Lipids in AM.    5.      CKD-   Most Likely Due to Uncontrolled #1, and #2.  Monitor BUN/Cr and Electrolytes.     6.      Anemia- Normocytic Indices, Check anemia Panel, and FOBT.    7.     Narcolepsy- Self Reported,  If receptive for help may agree to outpatient referral to Neurology.  8.     Noncompliance-  ? Poor Judgment, versus Life Choice.    9.     DVT prophylaxis- with Lovenox.     10.  Social work Consultation-  Environmental health practitioner and Circumstances.         Code Status:   FULL CODE Family Communication:    No Family Present Disposition Plan:     Inpatient Telemetry  Time spent:   [place]  [facility] Pager [phone]  If 7PM-7AM, please contact night-coverage [email or web address] Password [email or web address] 11/13/2013, 12:12 AM

## 2013-11-13 NOTE — Progress Notes (Signed)
[facility] PROGRESS NOTE Interim History: Interim History: 80 y.o. male who presented to the ED with complaints of increased swelling of both legs worsening over the past 2 weeks. He has also had SOB and orthopnea and DOE. He denies Chest pain. He has a documented medical history of Chronic Systolic CHF and Chronic lymphedema, and he had been on lasix at one time but not recently. He has a history of non-compliance with medications because he reports that the medications interfere with his ablity to function by worsening his narcolepsy. He has Diabetes and HTN and is not on any medications. He currently lives with one of his daughters Danley Danker Weights   11/12/13 2011 11/13/13 0433  Weight: 110.269 kg (243 lb 1.6 oz) 107.684 kg (237 lb 6.4 oz)        Intake/Output Summary (Last 24 hours) at 11/13/13 1036 Last data filed at 11/13/13 1004  Gross per 24 hour  Intake    600 ml  Output   3775 ml  Net  -3175 ml     Assessment/Plan: CHF, acute on chronic: - Good UOP with lasix IV. - Fluid restrict monitor electrolytes. Daily weights. - 110.2->107.6 kg. - cont lasix, BB and ACE-I.  CKD (chronic kidney disease), stage III - Baseline Cr 1.6-1.8.  Diabetes mellitus - CBG's high. - Start lantus.  Hyperlipidemia: - Check Fasting Lipids in AM.   CKD: - Most Likely Due to Uncontrolled #1, and #2. Monitor BUN/Cr and Electrolytes.  - currently stable.  Anemia: - Normocytic Indices, Check anemia Panel, and FOBT.   Narcolepsy: - Self Reported, If receptive for help may agree to outpatient referral to Neurology.   Noncompliance: - ? Poor Judgment, versus Life Choice.   DVT Prophylaxis: - With Lovenox.    Code Status: FULL CODE  Family Communication: No Family Present  Disposition Plan: Inpatient Telemetry   Consultants:  none  Procedures: ECHO: pending.  Antibiotics:  none  HPI/Subjective: Poor choice he relates  Objective: Filed Vitals:   11/12/13 2011  11/12/13 2239 11/13/13 0433 11/13/13 1003  BP: 188/63 176/67 155/68 166/63  Pulse: 80 77 82 65  Temp: 97.2 F (36.2 C) 98.7 F (37.1 C) 98.2 F (36.8 C) 97.9 F (36.6 C)  TempSrc: Oral Oral Oral Oral  Resp: 18 20 18 18   Height: 5\' 9"  (1.753 m)     Weight: 110.269 kg (243 lb 1.6 oz)  107.684 kg (237 lb 6.4 oz)   SpO2: 100% 100% 100% 100%     Exam: General: Alert, awake, oriented x3, in no acute distress.  HEENT: No bruits, no goiter. +JVD Heart: Regular rate and rhythm, lower extremity edema Lungs: Good air movement, crackles Abdomen: Soft, nontender, nondistended, positive bowel sounds.     Data Reviewed: Basic Metabolic Panel:  Recent Labs Lab 11/12/13 1455 11/13/13 0520  NA 143 140  K 4.2 4.1  CL 105 101  CO2 28 28  GLUCOSE 155* 198*  BUN 17 21  CREATININE 1.46* 1.86*  CALCIUM 8.5 8.7   Liver Function Tests:  Recent Labs Lab 11/12/13 1455  AST 9  ALT 8  ALKPHOS 74  BILITOT 0.3  PROT 6.8  ALBUMIN 3.3*   No results found for this basename: LIPASE, AMYLASE,  in the last 168 hours No results found for this basename: AMMONIA,  in the last 168 hours CBC:  Recent Labs Lab 11/12/13 1455  WBC 3.6*  NEUTROABS 2.1  HGB 8.7*  HCT 27.1*  MCV 84.4  PLT 235  Cardiac Enzymes:  Recent Labs Lab 11/12/13 1455  TROPONINI <0.30   BNP (last 3 results)  Recent Labs  07/22/13 0341 07/27/13 0105 11/12/13 1455  PROBNP 1758.0* 1267.0* 2435.0*   CBG:  Recent Labs Lab 11/12/13 2236 11/13/13 0611  GLUCAP 201* 178*    No results found for this or any previous visit (from the past 240 hour(s)).   Studies: Dg Chest 2 View  11/12/2013   CLINICAL DATA:  Edema  EXAM: CHEST  2 VIEW  COMPARISON:  07/21/2013  FINDINGS: Pulmonary vascular congestion, chronic. No frank interstitial edema. No pleural effusion or pneumothorax.  Mild cardiomegaly.  Degenerative changes of the visualized thoracolumbar spine.  IMPRESSION: Cardiomegaly with pulmonary vascular  congestion. No frank interstitial edema   Electronically Signed   By: Julian Hy M.D.   On: 11/12/2013 18:38    Scheduled Meds: . carvedilol  3.125 mg Oral BID WC  . enoxaparin (LOVENOX) injection  30 mg Subcutaneous Q24H  . furosemide  40 mg Intravenous Q12H  . insulin aspart  0-5 Units Subcutaneous QHS  . insulin aspart  0-9 Units Subcutaneous TID WC  . lisinopril  5 mg Oral Daily  . potassium chloride  10 mEq Oral BID  . sodium chloride  3 mL Intravenous Q12H   Continuous Infusions:    Charlynne Cousins  [facility] Pager [phone] If 8PM-8AM, please contact night-coverage at [email or web address], password [email or web address] 11/13/2013, 10:36 AM  LOS: 1 day

## 2013-11-13 NOTE — Progress Notes (Signed)
Pt. Refused night BP medication and Lovenox. Pt. BP 176/67. Pt. Stated that BP is his norm and that he doesn't function well if BP is low. RN educated pt. Pt. Stated he does not have any "heart issues" and nothing is wrong with his heart. Pt. Continued to refuse med. On call NP, Jonette Eva, made aware. No new orders received. RN will continue to monitor pt. For changes in condition. [employer], Katherine Roan

## 2013-11-13 NOTE — Progress Notes (Signed)
Echo Lab  2D Echocardiogram completed.  [place], RDCS 11/13/2013 11:33 AM

## 2013-11-14 LAB — BASIC METABOLIC PANEL
BUN: 26 mg/dL — ABNORMAL HIGH (ref 6–23)
CALCIUM: 9 mg/dL (ref 8.4–10.5)
CO2: 29 mEq/L (ref 19–32)
Chloride: 96 mEq/L (ref 96–112)
Creatinine, Ser: 1.93 mg/dL — ABNORMAL HIGH (ref 0.50–1.35)
GFR calc Af Amer: 37 mL/min — ABNORMAL LOW (ref >90)
GFR, EST NON AFRICAN AMERICAN: 32 mL/min — AB (ref >90)
Glucose, Bld: 162 mg/dL — ABNORMAL HIGH (ref 70–99)
POTASSIUM: 3.8 meq/L (ref 3.7–5.3)
SODIUM: 138 meq/L (ref 137–147)

## 2013-11-14 LAB — GLUCOSE, CAPILLARY
GLUCOSE-CAPILLARY: 159 mg/dL — AB (ref 70–99)
GLUCOSE-CAPILLARY: 247 mg/dL — AB (ref 70–99)
Glucose-Capillary: 161 mg/dL — ABNORMAL HIGH (ref 70–99)
Glucose-Capillary: 186 mg/dL — ABNORMAL HIGH (ref 70–99)

## 2013-11-14 MED ORDER — SODIUM CHLORIDE 0.9 % IV SOLN
500.0000 mg | Freq: Once | INTRAVENOUS | Status: AC
  Administered 2013-11-14: 500 mg via INTRAVENOUS
  Filled 2013-11-14 (×2): qty 10

## 2013-11-14 MED ORDER — CYANOCOBALAMIN 1000 MCG/ML IJ SOLN
1000.0000 ug | Freq: Every day | INTRAMUSCULAR | Status: DC
  Administered 2013-11-15 – 2013-11-16 (×2): 1000 ug via INTRAMUSCULAR
  Filled 2013-11-14 (×5): qty 1

## 2013-11-14 MED ORDER — SODIUM CHLORIDE 0.9 % IV SOLN
1.0000 mg | Freq: Once | INTRAVENOUS | Status: AC
  Administered 2013-11-14: 1 mg via INTRAVENOUS
  Filled 2013-11-14: qty 0.2

## 2013-11-14 MED ORDER — INSULIN DETEMIR 100 UNIT/ML ~~LOC~~ SOLN
10.0000 [IU] | Freq: Every day | SUBCUTANEOUS | Status: DC
  Administered 2013-11-14: 10 [IU] via SUBCUTANEOUS
  Filled 2013-11-14 (×2): qty 0.1

## 2013-11-14 MED ORDER — SODIUM CHLORIDE 0.9 % IV SOLN
25.0000 mg | Freq: Once | INTRAVENOUS | Status: AC
  Administered 2013-11-14: 25 mg via INTRAVENOUS
  Filled 2013-11-14: qty 0.5

## 2013-11-14 NOTE — Progress Notes (Signed)
[facility] PROGRESS NOTE Interim History: Interim History: 80 y.o. male who presented to the ED with complaints of increased swelling of both legs worsening over the past 2 weeks. He has also had SOB and orthopnea and DOE. He denies Chest pain. He has a documented medical history of Chronic Systolic CHF and Chronic lymphedema, and he had been on lasix at one time but not recently. He has a history of non-compliance with medications because he reports that the medications interfere with his ablity to function by worsening his narcolepsy. He has Diabetes and HTN and is not on any medications. He currently lives with one of his daughters Danley Danker Weights   11/12/13 2011 11/13/13 0433  Weight: 110.269 kg (243 lb 1.6 oz) 107.684 kg (237 lb 6.4 oz)        Intake/Output Summary (Last 24 hours) at 11/13/13 1036 Last data filed at 11/13/13 1004  Gross per 24 hour  Intake    600 ml  Output   3775 ml  Net  -3175 ml     Assessment/Plan: CHF, acute on chronic: - Good UOP with lasix IV gtt. - Fluid restrict, monitor electrolytes. Daily weights. Place ted hose. - 110.2->107.6->105 kg. - cont lasix, BB and ACE-I.  CKD (chronic kidney disease), stage III - Baseline Cr 1.6-1.8.  Diabetes mellitus - CBG's high. - Start lantus.  Hyperlipidemia: - Check Fasting Lipids in AM.   CKD: - Most Likely Due to Uncontrolled #1, and #2. Monitor BUN/Cr and Electrolytes.  - currently stable.  Anemia due to Iron deficiency: - Iron deficiency and probably b12 deficiency ( level 342). and FOBT.   Narcolepsy: - Self Reported, If receptive for help may agree to outpatient referral to Neurology.   Noncompliance: - ? Poor Judgment, versus Life Choice.   DVT Prophylaxis: - With Lovenox.    Code Status: FULL CODE  Family Communication: No Family Present  Disposition Plan: Inpatient Telemetry   Consultants:  none  Procedures: ECHO:  pending.  Antibiotics:  none  HPI/Subjective: Pt more understanding about what is going on.  Objective: Filed Vitals:   11/12/13 2011 11/12/13 2239 11/13/13 0433 11/13/13 1003  BP: 188/63 176/67 155/68 166/63  Pulse: 80 77 82 65  Temp: 97.2 F (36.2 C) 98.7 F (37.1 C) 98.2 F (36.8 C) 97.9 F (36.6 C)  TempSrc: Oral Oral Oral Oral  Resp: 18 20 18 18   Height: 5\' 9"  (1.753 m)     Weight: 110.269 kg (243 lb 1.6 oz)  107.684 kg (237 lb 6.4 oz)   SpO2: 100% 100% 100% 100%     Exam: General: Alert, awake, oriented x3, in no acute distress.  HEENT: No bruits, no goiter. -JVD Heart: Regular rate and rhythm, lower extremity edema Lungs: Good air movement, clear Abdomen: Soft, nontender, nondistended, positive bowel sounds.     Data Reviewed: Basic Metabolic Panel:  Recent Labs Lab 11/12/13 1455 11/13/13 0520  NA 143 140  K 4.2 4.1  CL 105 101  CO2 28 28  GLUCOSE 155* 198*  BUN 17 21  CREATININE 1.46* 1.86*  CALCIUM 8.5 8.7   Liver Function Tests:  Recent Labs Lab 11/12/13 1455  AST 9  ALT 8  ALKPHOS 74  BILITOT 0.3  PROT 6.8  ALBUMIN 3.3*   No results found for this basename: LIPASE, AMYLASE,  in the last 168 hours No results found for this basename: AMMONIA,  in the last 168 hours CBC:  Recent Labs Lab 11/12/13 1455  WBC  3.6*  NEUTROABS 2.1  HGB 8.7*  HCT 27.1*  MCV 84.4  PLT 235   Cardiac Enzymes:  Recent Labs Lab 11/12/13 1455  TROPONINI <0.30   BNP (last 3 results)  Recent Labs  07/22/13 0341 07/27/13 0105 11/12/13 1455  PROBNP 1758.0* 1267.0* 2435.0*   CBG:  Recent Labs Lab 11/12/13 2236 11/13/13 0611  GLUCAP 201* 178*    No results found for this or any previous visit (from the past 240 hour(s)).   Studies: Dg Chest 2 View  11/12/2013   CLINICAL DATA:  Edema  EXAM: CHEST  2 VIEW  COMPARISON:  07/21/2013  FINDINGS: Pulmonary vascular congestion, chronic. No frank interstitial edema. No pleural effusion or  pneumothorax.  Mild cardiomegaly.  Degenerative changes of the visualized thoracolumbar spine.  IMPRESSION: Cardiomegaly with pulmonary vascular congestion. No frank interstitial edema   Electronically Signed   By: Julian Hy M.D.   On: 11/12/2013 18:38    Scheduled Meds: . carvedilol  3.125 mg Oral BID WC  . enoxaparin (LOVENOX) injection  30 mg Subcutaneous Q24H  . furosemide  40 mg Intravenous Q12H  . insulin aspart  0-5 Units Subcutaneous QHS  . insulin aspart  0-9 Units Subcutaneous TID WC  . lisinopril  5 mg Oral Daily  . potassium chloride  10 mEq Oral BID  . sodium chloride  3 mL Intravenous Q12H   Continuous Infusions:    Charlynne Cousins  [facility] Pager [phone] If 8PM-8AM, please contact night-coverage at [email or web address], password [facility] 11/13/2013, 10:36 AM  LOS: 1 day

## 2013-11-15 DIAGNOSIS — D72829 Elevated white blood cell count, unspecified: Secondary | ICD-10-CM

## 2013-11-15 LAB — GLUCOSE, CAPILLARY
GLUCOSE-CAPILLARY: 156 mg/dL — AB (ref 70–99)
Glucose-Capillary: 221 mg/dL — ABNORMAL HIGH (ref 70–99)
Glucose-Capillary: 166 mg/dL — ABNORMAL HIGH (ref 70–99)
Glucose-Capillary: 182 mg/dL — ABNORMAL HIGH (ref 70–99)

## 2013-11-15 LAB — BASIC METABOLIC PANEL
BUN: 33 mg/dL — ABNORMAL HIGH (ref 6–23)
CHLORIDE: 94 meq/L — AB (ref 96–112)
CO2: 31 mEq/L (ref 19–32)
Calcium: 9.2 mg/dL (ref 8.4–10.5)
Creatinine, Ser: 2.17 mg/dL — ABNORMAL HIGH (ref 0.50–1.35)
GFR calc Af Amer: 32 mL/min — ABNORMAL LOW (ref >90)
GFR calc non Af Amer: 28 mL/min — ABNORMAL LOW (ref >90)
GLUCOSE: 147 mg/dL — AB (ref 70–99)
POTASSIUM: 3.9 meq/L (ref 3.7–5.3)
SODIUM: 138 meq/L (ref 137–147)

## 2013-11-15 LAB — FOLATE RBC: RBC FOLATE: 612 ng/mL — AB (ref >280)

## 2013-11-15 MED ORDER — ENOXAPARIN SODIUM 30 MG/0.3ML ~~LOC~~ SOLN
30.0000 mg | SUBCUTANEOUS | Status: DC
  Administered 2013-11-15: 30 mg via SUBCUTANEOUS
  Filled 2013-11-15 (×3): qty 0.3

## 2013-11-15 MED ORDER — ENOXAPARIN SODIUM 40 MG/0.4ML ~~LOC~~ SOLN
40.0000 mg | SUBCUTANEOUS | Status: DC
  Filled 2013-11-15: qty 0.4

## 2013-11-15 MED ORDER — INSULIN DETEMIR 100 UNIT/ML ~~LOC~~ SOLN
15.0000 [IU] | Freq: Every day | SUBCUTANEOUS | Status: DC
  Administered 2013-11-15 – 2013-11-16 (×2): 15 [IU] via SUBCUTANEOUS
  Filled 2013-11-15 (×2): qty 0.15

## 2013-11-15 MED ORDER — FUROSEMIDE 40 MG PO TABS
40.0000 mg | ORAL_TABLET | Freq: Two times a day (BID) | ORAL | Status: DC
  Administered 2013-11-15 (×2): 40 mg via ORAL
  Filled 2013-11-15 (×5): qty 1

## 2013-11-15 NOTE — Plan of Care (Signed)
Problem: Phase I Progression Outcomes Goal: EF % per last Echo/documented,Core Reminder form on chart Outcome: Completed/Met Date Met:  11/15/13 EF 60-65%(11-13-13)

## 2013-11-15 NOTE — Progress Notes (Signed)
[facility] PROGRESS NOTE Interim History: Interim History: 80 y.o. male who presented to the ED with complaints of increased swelling of both legs worsening over the past 2 weeks. He has also had SOB and orthopnea and DOE. He denies Chest pain. He has a documented medical history of Chronic Systolic CHF and Chronic lymphedema, and he had been on lasix at one time but not recently. He has a history of non-compliance with medications because he reports that the medications interfere with his ablity to function by worsening his narcolepsy. He has Diabetes and HTN and is not on any medications. He currently lives with one of his daughters Danley Danker Weights   11/13/13 0433 11/14/13 0518 11/15/13 0639  Weight: 107.684 kg (237 lb 6.4 oz) 105.325 kg (232 lb 3.2 oz) 103.057 kg (227 lb 3.2 oz)    Intake/Output Summary (Last 24 hours) at 11/13/13 1036 Last data filed at 11/13/13 1004  Gross per 24 hour  Intake    600 ml  Output   3775 ml  Net  -3175 ml     Assessment/Plan: CHF, acute on chronic: - Good UOP with lasix IV gtt, change to oral lasix as creatinine worsen (2.1). - Fluid restrict, monitor electrolytes. Daily weights. Place ted hose. - 110.2->107.6->105-> 103.0 kg. - cont lasix, BB and ACE-I.  CKD (chronic kidney disease), stage III - Baseline Cr 1.6-1.8.  Diabetes mellitus - CBG's high. - Increase lantus  Hyperlipidemia: - Check Fasting Lipids in AM.   CKD: - Most Likely Due to Uncontrolled #1, and #2. Monitor BUN/Cr and Electrolytes.  - currently stable.  Anemia due to Iron deficiency: - Iron deficiency and probably b12 deficiency ( level 342). and FOBT.   Narcolepsy: - Self Reported, If receptive for help may agree to outpatient referral to Neurology.   Noncompliance: - ? Poor Judgment, versus Life Choice.   DVT Prophylaxis: - With Lovenox.    Code Status: FULL CODE  Family Communication: No Family Present  Disposition Plan: Inpatient  Telemetry   Consultants:  none  Procedures: ECHO: pending.  Antibiotics:  none  HPI/Subjective: Pt more understanding about what is going on. Want to go to a facility.  Objective: Filed Vitals:   11/12/13 2011 11/12/13 2239 11/13/13 0433 11/13/13 1003  BP: 188/63 176/67 155/68 166/63  Pulse: 80 77 82 65  Temp: 97.2 F (36.2 C) 98.7 F (37.1 C) 98.2 F (36.8 C) 97.9 F (36.6 C)  TempSrc: Oral Oral Oral Oral  Resp: 18 20 18 18   Height: 5\' 9"  (1.753 m)     Weight: 110.269 kg (243 lb 1.6 oz)  107.684 kg (237 lb 6.4 oz)   SpO2: 100% 100% 100% 100%     Exam: General: Alert, awake, oriented x3, in no acute distress.  HEENT: No bruits, no goiter. -JVD Heart: Regular rate and rhythm, lower extremity edema Lungs: Good air movement, clear Abdomen: Soft, nontender, nondistended, positive bowel sounds.     Data Reviewed: Basic Metabolic Panel:  Recent Labs Lab 11/12/13 1455 11/13/13 0520  NA 143 140  K 4.2 4.1  CL 105 101  CO2 28 28  GLUCOSE 155* 198*  BUN 17 21  CREATININE 1.46* 1.86*  CALCIUM 8.5 8.7   Liver Function Tests:  Recent Labs Lab 11/12/13 1455  AST 9  ALT 8  ALKPHOS 74  BILITOT 0.3  PROT 6.8  ALBUMIN 3.3*   No results found for this basename: LIPASE, AMYLASE,  in the last 168 hours No results found  for this basename: AMMONIA,  in the last 168 hours CBC:  Recent Labs Lab 11/12/13 1455  WBC 3.6*  NEUTROABS 2.1  HGB 8.7*  HCT 27.1*  MCV 84.4  PLT 235   Cardiac Enzymes:  Recent Labs Lab 11/12/13 1455  TROPONINI <0.30   BNP (last 3 results)  Recent Labs  07/22/13 0341 07/27/13 0105 11/12/13 1455  PROBNP 1758.0* 1267.0* 2435.0*   CBG:  Recent Labs Lab 11/12/13 2236 11/13/13 0611  GLUCAP 201* 178*    No results found for this or any previous visit (from the past 240 hour(s)).   Studies: Dg Chest 2 View  11/12/2013   CLINICAL DATA:  Edema  EXAM: CHEST  2 VIEW  COMPARISON:  07/21/2013  FINDINGS: Pulmonary  vascular congestion, chronic. No frank interstitial edema. No pleural effusion or pneumothorax.  Mild cardiomegaly.  Degenerative changes of the visualized thoracolumbar spine.  IMPRESSION: Cardiomegaly with pulmonary vascular congestion. No frank interstitial edema   Electronically Signed   By: Julian Hy M.D.   On: 11/12/2013 18:38    Scheduled Meds: . carvedilol  3.125 mg Oral BID WC  . enoxaparin (LOVENOX) injection  30 mg Subcutaneous Q24H  . furosemide  40 mg Intravenous Q12H  . insulin aspart  0-5 Units Subcutaneous QHS  . insulin aspart  0-9 Units Subcutaneous TID WC  . lisinopril  5 mg Oral Daily  . potassium chloride  10 mEq Oral BID  . sodium chloride  3 mL Intravenous Q12H   Continuous Infusions:    Charlynne Cousins  [facility] Pager [phone] If 8PM-8AM, please contact night-coverage at [email or web address], password [facility] 11/13/2013, 10:36 AM  LOS: 1 day

## 2013-11-15 NOTE — Progress Notes (Signed)
Report given to receiving RN. Patient in bed resting watching TV. No verbal complaints. No signs or symptoms of distress or discomfort.

## 2013-11-15 NOTE — Evaluation (Addendum)
Physical Therapy Evaluation Patient Details Name: [name] MRN: [id number] DOB: 1934-03-06 Today's Date: 11/15/2013 Time: 7106-2694 PT Time Calculation (min): 32 min  PT Assessment / Plan / Recommendation History of Present Illness  [name] is a 80 y.o. male who presented to the ED with complaints of increased swelling of both legs worsening over the past 2 weeks.  He has also had SOB and orthopnea and DOE.  He denies Chest pain.   He has a documented medical history of  Chronic Systolic CHF and  Chronic lymphedema, and he had been on lasix at one time but not recently.  He has a history of non-compliance with medications because he reports that the medications interfere with his ablity to function by worsening his narcolepsy.   He has Diabetes and HTN and is not on any medications.   He currently lives with one of his daughters.      Clinical Impression  Patient presents with problems listed below.   Will benefit from acute PT to maximize independence prior to discharge.  Primary issue is d/c plan - patient reports he will not go back to daughter's apartment.  Reports sister can possibly help.  If unable to go to either of these 2 options, patient will need SNF at discharge for continued therapy and safe discharge plan.    PT Assessment  Patient needs continued PT services    Follow Up Recommendations  SNF;Supervision/Assistance - 24 hour    Does the patient have the potential to tolerate intense rehabilitation      Barriers to Discharge Inaccessible home environment Patient reports  not willing to return to daughter's apartment.  Reports sister can possibly help.  If unable to go to daughter's or sister's home, will need to go to SNF for safe discharge plan.    Equipment Recommendations  None recommended by PT    Recommendations for Other Services     Frequency Min 2X/week    Precautions / Restrictions Precautions Precautions: Fall Restrictions Weight Bearing  Restrictions: No   Pertinent Vitals/Pain Dyspnea 2/4 with ambulation.      Mobility  Bed Mobility Overal bed mobility: Modified Independent General bed mobility comments: Uses bed rail. Transfers Overall transfer level: Needs assistance Equipment used: 4-wheeled walker Transfers: Sit to/from Stand Sit to Stand: Supervision General transfer comment: Patient uses correct hand placement during transfers. Ambulation/Gait Ambulation/Gait assistance: Min guard Ambulation Distance (Feet): 180 Feet Assistive device: 4-wheeled walker Gait Pattern/deviations: Step-through pattern;Decreased stride length;Trunk flexed Gait velocity: Slow gait speed Gait velocity interpretation: Below normal speed for age/gender General Gait Details: Verbal cues to stay close to RW and stand upright.  Patient able to maneuver RW safely.    Exercises     PT Diagnosis: Abnormality of gait;Generalized weakness  PT Problem List: Decreased strength;Decreased activity tolerance;Decreased balance;Decreased mobility;Cardiopulmonary status limiting activity;Obesity PT Treatment Interventions: DME instruction;Gait training;Functional mobility training;Therapeutic exercise;Patient/family education     PT Goals(Current goals can be found in the care plan section) Acute Rehab PT Goals Patient Stated Goal: None related to therapy.  States he wants to buy a house. PT Goal Formulation: With patient Time For Goal Achievement: 11/22/13 Potential to Achieve Goals: Good  Visit Information  Last PT Received On: 11/15/13 Assistance Needed: +1 History of Present Illness: [name] is a 80 y.o. male who presented to the ED with complaints of increased swelling of both legs worsening over the past 2 weeks.  He has also had SOB and orthopnea and DOE.  He denies  Chest pain.   He has a documented medical history of  Chronic Systolic CHF and  Chronic lymphedema, and he had been on lasix at one time but not recently.  He has  a history of non-compliance with medications because he reports that the medications interfere with his ablity to function by worsening his narcolepsy.   He has Diabetes and HTN and is not on any medications.   He currently lives with one of his daughters.           Prior [place] expects to be discharged to:: Skilled nursing facility (Unless patient able to go home with daughter or sister) Living Arrangements: Children Home Equipment: Environmental consultant - 4 wheels Additional Comments: Pt reports he lived at [facility] for ~1 year before checking himself out.  He reports that he has been staying in his dtr's apt. since that time and living on her couch.  He also reports that returning home with daughter is not an option. Prior Function Level of Independence: Needs assistance Gait / Transfers Assistance Needed: Ambulates longer distances with rollator.  Ambulates without assistive device for shorter distances. ADL's / Homemaking Assistance Needed: Assist with lower body bathing, dressing.  Assist with meals. Communication Communication: No difficulties Dominant Hand: Right    Cognition  Cognition Arousal/Alertness: Awake/alert Behavior During Therapy: Anxious (Becomes angry and agitated when talking about daughter.) Overall Cognitive Status: Within Functional Limits for tasks assessed (Safety/judgement decreased at baseline)    Extremity/Trunk Assessment Upper Extremity Assessment Upper Extremity Assessment: Overall WFL for tasks assessed Lower Extremity Assessment Lower Extremity Assessment: Generalized weakness   Noted bil. LE edema.  Balance    End of Session PT - End of Session Equipment Utilized During Treatment: Gait belt Activity Tolerance: Patient limited by fatigue Patient left: in bed;with call bell/phone within reach (sitting EOB) Nurse Communication: Mobility status (D/C issues)  GP     Despina Pole 11/15/2013, 12:42 PM Carita Pian. Sanjuana Kava,  [place] Pager [phone]

## 2013-11-16 LAB — BASIC METABOLIC PANEL
BUN: 44 mg/dL — AB (ref 6–23)
CALCIUM: 8.8 mg/dL (ref 8.4–10.5)
CO2: 31 meq/L (ref 19–32)
Chloride: 92 mEq/L — ABNORMAL LOW (ref 96–112)
Creatinine, Ser: 2.3 mg/dL — ABNORMAL HIGH (ref 0.50–1.35)
GFR calc Af Amer: 30 mL/min — ABNORMAL LOW (ref >90)
GFR calc non Af Amer: 26 mL/min — ABNORMAL LOW (ref >90)
GLUCOSE: 132 mg/dL — AB (ref 70–99)
Potassium: 4.2 mEq/L (ref 3.7–5.3)
SODIUM: 138 meq/L (ref 137–147)

## 2013-11-16 LAB — GLUCOSE, CAPILLARY
GLUCOSE-CAPILLARY: 230 mg/dL — AB (ref 70–99)
GLUCOSE-CAPILLARY: 191 mg/dL — AB (ref 70–99)
Glucose-Capillary: 130 mg/dL — ABNORMAL HIGH (ref 70–99)

## 2013-11-16 MED ORDER — SODIUM CHLORIDE 0.9 % IV BOLUS (SEPSIS)
250.0000 mL | Freq: Once | INTRAVENOUS | Status: AC
  Administered 2013-11-16: 250 mL via INTRAVENOUS

## 2013-11-16 MED ORDER — GLIPIZIDE 2.5 MG HALF TABLET
2.5000 mg | ORAL_TABLET | Freq: Every day | ORAL | Status: DC
  Filled 2013-11-16 (×2): qty 1

## 2013-11-16 NOTE — Clinical Social Work Psychosocial (Signed)
11/16/13  I have read and reviewed SW Interns' Psychosocial assessment and agree to content. This patient is familiar to me from past hospitalization. He does not want to return home with his daughter but is unable to complete safe plan for independent living. He adamantly refuses SNF or ALF placement. On last admission, Patient wanted CSW to find him a house, furnishing, food and transportation.  CSW was unable to convince patient this this was not within my scope to do that and he became very upset with me and did not wish to discuss further.  Patient's story remains consistent of an unsatisfactory living arrangement with his daughter but again- refuses to consider even short term SNF which is recommended by Physical Therapy.  He remains adamant that he wants to live indepedently in his own home.  CSW will talk with patient tomorrow and attempt to determine other courses of action.  Lorie Phenix. [place], [place]  SW Intern's Supervisor

## 2013-11-16 NOTE — Progress Notes (Signed)
I cosign with Demarcus Steward on all assessments, documentation and medicine administration for this shift. Ibrahim Mcpheeters A, RN  

## 2013-11-16 NOTE — Clinical Social Work Psychosocial (Signed)
Clinical Social Work Department BRIEF PSYCHOSOCIAL ASSESSMENT 11/16/2013  Patient:  [name]     Account Number:  [id number]     Admit date:  11/09/2013  Clinical Social Worker:  Donnella Sham, CLINICAL SOCIAL WORKER  Date/Time:  11/16/2013 03:58 PM  Referred by:  Physician  Date Referred:  11/16/2013 Referred for  SNF Placement   Other Referral:   Interview type:  Patient Other interview type:    PSYCHOSOCIAL DATA Living Status:  FAMILY Admitted from facility:   Level of care:   Primary support name:  Deaaron Fulghum [phone]) Primary support relationship to patient:  CHILD, ADULT Degree of support available:  Limited, poor  CURRENT CONCERNS Current Concerns  Post-Acute Placement   Other Concerns:    SOCIAL WORK ASSESSMENT / PLAN sw student met with patient at bedside. pt was alert and oriented. sw student stated that PT was recommending pt to go to a SNF to get some therapy to get stronger before he went home. Pt stated there was no way that he was going. SW student asked pt what his living situation was like and pt stated that he lives with his daughter and she makes him sleep on the couch even though she has a two-bedroom apartment. Pt stated that he has been to [facility] in [place], [place]. Pt also stated he wants to live independently and find a place by himself. Pt stated that he has plenty of money to go and live by himself. SW student told pt she would infrom her supervisor to help him find assisted living placement. Sw student informed supervisor of the situation.   Assessment/plan status:  Psychosocial Support/Ongoing Assessment of Needs Other assessment/ plan:   Information/referral to community resources:    PATIENT'S/FAMILY'S RESPONSE TO PLAN OF CARE: Sw student met with pt at bedside. Pt is alert and oriented. Pt is refusing SNF placement. Pt stated "no one is going to tell me what to do. I'll do what  I want to do when I want too."     Prescilla Sours, [place] Intern [phone]

## 2013-11-16 NOTE — Progress Notes (Signed)
Pt very Upset, refuses to have CBG checks during night shift, refuses any medication at this time, refers " I only wants for you to leave me alone". Pt denies any pain or discomfort, no distress noticed, We'll continue to monitor.

## 2013-11-16 NOTE — Care Management Note (Signed)
    Page 1 of 1   11/16/2013     11:28:54 AM   CARE MANAGEMENT NOTE 11/16/2013  Patient:  [name],[name]   Account Number:  [id number]  Date Initiated:  11/16/2013  Documentation initiated by:  [facility]  Subjective/Objective Assessment:   80 y.o. male who presented to the ED with complaints of increased swelling of both legs worsening over the past 2 weeks.  He has also had SOB and orthopnea and DOE//Home with daughter     Action/Plan:   Telemetry Monitoring, placed on the CHF Protocol and Diurese with IV lasix , monitor electrolytes.//Access for [facility] vs SNF   Anticipated DC Date:  11/17/2013   Anticipated DC Plan:  SKILLED NURSING FACILITY  In-house referral  Clinical Social Worker      DC Planning Services  CM consult      Choice offered to / List presented to:             Status of service:  In process, will continue to follow Medicare Important Message given?   (If response is "NO", the following Medicare IM given date fields will be blank) Date Medicare IM given:   Date Additional Medicare IM given:    Discharge Disposition:    Per UR Regulation:    If discussed at Long Length of Stay Meetings, dates discussed:    Comments:  11/16/13 [place], RN, BSN, [place] [phone] Per PT note 3/23: Will benefit from acute PT to maximize independence prior to discharge.  Primary issue is d/c plan - patient reports he will not go back to daughter's apartment.  Reports sister can possibly help.  If unable to go to either of these 2 options, patient will need SNF at discharge for continued therapy and safe discharge plan. LCSW Kendell Bane aware and is working of placement for patient.

## 2013-11-16 NOTE — Progress Notes (Addendum)
[facility] PROGRESS NOTE Interim History: Interim History: 80 y.o. male who presented to the ED with complaints of increased swelling of both legs worsening over the past 2 weeks. He has also had SOB and orthopnea and DOE. He denies Chest pain. He has a documented medical history of Chronic Systolic CHF and Chronic lymphedema, and he had been on lasix at one time but not recently. He has a history of non-compliance with medications because he reports that the medications interfere with his ablity to function by worsening his narcolepsy. He has Diabetes and HTN and is not on any medications. He currently lives with one of his daughters Danley Danker Weights   11/14/13 0518 11/15/13 0639 11/16/13 0618  Weight: 105.325 kg (232 lb 3.2 oz) 103.057 kg (227 lb 3.2 oz) 103.012 kg (227 lb 1.6 oz)    Intake/Output Summary (Last 24 hours) at 11/13/13 1036 Last data filed at 11/13/13 1004  Gross per 24 hour  Intake    600 ml  Output   3775 ml  Net  -3175 ml     Assessment/Plan: CHF, acute on chronic: - Good UOP with lasix IV, Held lasix creatinine worsen, give NS 250 cc, recheck B-met in am. - Fluid restrict, monitor electrolytes. Daily weights. Place ted hose. - 110.2->107.6->105-> 103.0 kg. - cont lasix, BB and ACE-I. - d/c home in am if Cr. Is improved.  - awaiting bed placement.  Acute on CKD (chronic kidney disease), stage III - Baseline Cr 1.8. Due to overdiuresis - give NS bolus recheck b-met in am. -   Diabetes mellitus, Newly diagnose: - CBG's high. - good control. Cont lantus ans SSI. - will benefit from glipizide as an outpatient.  Hyperlipidemia: - Check Fasting Lipids in AM.   CKD: - Most Likely Due to Uncontrolled #1, and #2. Monitor BUN/Cr and Electrolytes.  - currently stable.  Anemia due to Iron deficiency: - Iron deficiency and probably b12 deficiency ( level 342). and FOBT.   Narcolepsy: - Self Reported, If receptive for help may agree to outpatient  referral to Neurology.   Noncompliance: - ? Poor Judgment, versus Life Choice.   DVT Prophylaxis: - With Lovenox.    Code Status: FULL CODE  Family Communication: No Family Present  Disposition Plan: Inpatient Telemetry   Consultants:  none  Procedures: ECHO: pending.  Antibiotics:  none  HPI/Subjective: Want to go to a facility.  Objective: Filed Vitals:   11/12/13 2011 11/12/13 2239 11/13/13 0433 11/13/13 1003  BP: 188/63 176/67 155/68 166/63  Pulse: 80 77 82 65  Temp: 97.2 F (36.2 C) 98.7 F (37.1 C) 98.2 F (36.8 C) 97.9 F (36.6 C)  TempSrc: Oral Oral Oral Oral  Resp: _0 Height: _1  (1.753 m)     Weight: 110.269 kg (243 lb 1.6 oz)  107.684 kg (237 lb 6.4 oz)   SpO2: 100% 100% 100% 100%     Exam: General: Alert, awake, oriented x3, in no acute distress.  HEENT: No bruits, no goiter. -JVD Heart: Regular rate and rhythm, lower extremity edema Lungs: Good air movement, clear Abdomen: Soft, nontender, nondistended, positive bowel sounds.     Data Reviewed: Basic Metabolic Panel:  Recent Labs Lab 11/12/13 1455 11/13/13 0520  NA 143 140  K 4.2 4.1  CL 105 101  CO2 28 28  GLUCOSE 155* 198*  BUN 17 21  CREATININE 1.46* 1.86*  CALCIUM 8.5 8.7   Liver Function Tests:  Recent Labs Lab 11/12/13  1455  AST 9  ALT 8  ALKPHOS 74  BILITOT 0.3  PROT 6.8  ALBUMIN 3.3*   No results found for this basename: LIPASE, AMYLASE,  in the last 168 hours No results found for this basename: AMMONIA,  in the last 168 hours CBC:  Recent Labs Lab 11/12/13 1455  WBC 3.6*  NEUTROABS 2.1  HGB 8.7*  HCT 27.1*  MCV 84.4  PLT 235   Cardiac Enzymes:  Recent Labs Lab 11/12/13 1455  TROPONINI <0.30   BNP (last 3 results)  Recent Labs  07/22/13 0341 07/27/13 0105 11/12/13 1455  PROBNP 1758.0* 1267.0* 2435.0*   CBG:  Recent Labs Lab 11/12/13 2236 11/13/13 0611  GLUCAP 201* 178*    No results found for this or any previous  visit (from the past 240 hour(s)).   Studies: Dg Chest 2 View  11/12/2013   CLINICAL DATA:  Edema  EXAM: CHEST  2 VIEW  COMPARISON:  07/21/2013  FINDINGS: Pulmonary vascular congestion, chronic. No frank interstitial edema. No pleural effusion or pneumothorax.  Mild cardiomegaly.  Degenerative changes of the visualized thoracolumbar spine.  IMPRESSION: Cardiomegaly with pulmonary vascular congestion. No frank interstitial edema   Electronically Signed   By: Julian Hy M.D.   On: 11/12/2013 18:38    Scheduled Meds: . carvedilol  3.125 mg Oral BID WC  . enoxaparin (LOVENOX) injection  30 mg Subcutaneous Q24H  . furosemide  40 mg Intravenous Q12H  . insulin aspart  0-5 Units Subcutaneous QHS  . insulin aspart  0-9 Units Subcutaneous TID WC  . lisinopril  5 mg Oral Daily  . potassium chloride  10 mEq Oral BID  . sodium chloride  3 mL Intravenous Q12H   Continuous Infusions:    Charlynne Cousins  [facility] Pager [phone] If 8PM-8AM, please contact night-coverage at [email or web address], password [facility] 11/13/2013, 10:36 AM  LOS: 1 day

## 2013-11-16 NOTE — Progress Notes (Signed)
Pt refusing coreg and insulin this AM. Pt states he cannot take medicine and his blood pressure "has to stay high for me to live". Pt states that "you are trying to put me down with all of this medicine". RN attempted to educate pt on importance of taking coreg and insulin. Pt refuses explanation by RN.  Will continue to monitor pt and educate pt when able to. Ronnette Hila, RN

## 2013-11-16 NOTE — Progress Notes (Addendum)
1326 NSS 250 cc bolus completed as ordered . Tolerated well .

## 2013-11-17 LAB — BASIC METABOLIC PANEL
BUN: 47 mg/dL — ABNORMAL HIGH (ref 6–23)
CALCIUM: 8.5 mg/dL (ref 8.4–10.5)
CHLORIDE: 93 meq/L — AB (ref 96–112)
CO2: 29 meq/L (ref 19–32)
Creatinine, Ser: 2.21 mg/dL — ABNORMAL HIGH (ref 0.50–1.35)
GFR calc non Af Amer: 27 mL/min — ABNORMAL LOW (ref >90)
GFR, EST AFRICAN AMERICAN: 31 mL/min — AB (ref >90)
Glucose, Bld: 134 mg/dL — ABNORMAL HIGH (ref 70–99)
Potassium: 4.7 mEq/L (ref 3.7–5.3)
SODIUM: 137 meq/L (ref 137–147)

## 2013-11-17 LAB — GLUCOSE, CAPILLARY
GLUCOSE-CAPILLARY: 120 mg/dL — AB (ref 70–99)
GLUCOSE-CAPILLARY: 247 mg/dL — AB (ref 70–99)
Glucose-Capillary: 204 mg/dL — ABNORMAL HIGH (ref 70–99)

## 2013-11-17 MED ORDER — GLIPIZIDE 2.5 MG HALF TABLET: 2.5000 mg | ORAL_TABLET | Freq: Every day | ORAL | Status: DC

## 2013-11-17 MED ORDER — CARVEDILOL 3.125 MG PO TABS: 3.1250 mg | ORAL_TABLET | Freq: Two times a day (BID) | ORAL | Status: DC

## 2013-11-17 MED ORDER — LISINOPRIL 5 MG PO TABS: 5.0000 mg | ORAL_TABLET | Freq: Every day | ORAL | Status: DC

## 2013-11-17 NOTE — Progress Notes (Signed)
Pt still refusing all his medication, agree to have CBG check in am.

## 2013-11-17 NOTE — Discharge Instructions (Signed)
Heart Failure °Heart failure is a condition in which the heart has trouble pumping blood. This means your heart does not pump blood efficiently for your body to work well. In some cases of heart failure, fluid may back up into your lungs or you may have swelling (edema) in your lower legs. Heart failure is usually a long-term (chronic) condition. It is important for you to take good care of yourself and follow your caregiver's treatment plan. °CAUSES  °Some health conditions can cause heart failure. Those health conditions include: °· High blood pressure (hypertension) causes the heart muscle to work harder than normal. When pressure in the blood vessels is high, the heart needs to pump (contract) with more force in order to circulate blood throughout the body. High blood pressure eventually causes the heart to become stiff and weak. °· Coronary artery disease (CAD) is the buildup of cholesterol and fat (plaque) in the arteries of the heart. The blockage in the arteries deprives the heart muscle of oxygen and blood. This can cause chest pain and may lead to a heart attack. High blood pressure can also contribute to CAD. °· Heart attack (myocardial infarction) occurs when 1 or more arteries in the heart become blocked. The loss of oxygen damages the muscle tissue of the heart. When this happens, part of the heart muscle dies. The injured tissue does not contract as well and weakens the heart's ability to pump blood. °· Abnormal heart valves can cause heart failure when the heart valves do not open and close properly. This makes the heart muscle pump harder to keep the blood flowing. °· Heart muscle disease (cardiomyopathy or myocarditis) is damage to the heart muscle from a variety of causes. These can include drug or alcohol abuse, infections, or unknown reasons. These can increase the risk of heart failure. °· Lung disease makes the heart work harder because the lungs do not work properly. This can cause a strain  on the heart, leading it to fail. °· Diabetes increases the risk of heart failure. High blood sugar contributes to high fat (lipid) levels in the blood. Diabetes can also cause slow damage to tiny blood vessels that carry important nutrients to the heart muscle. When the heart does not get enough oxygen and food, it can cause the heart to become weak and stiff. This leads to a heart that does not contract efficiently. °· Other conditions can contribute to heart failure. These include abnormal heart rhythms, thyroid problems, and low blood counts (anemia). °Certain unhealthy behaviors can increase the risk of heart failure. Those unhealthy behaviors include: °· Being overweight. °· Smoking or chewing tobacco. °· Eating foods high in fat and cholesterol. °· Abusing illicit drugs or alcohol. °· Lacking physical activity. °SYMPTOMS  °Heart failure symptoms may vary and can be hard to detect. Symptoms may include: °· Shortness of breath with activity, such as climbing stairs. °· Persistent cough. °· Swelling of the feet, ankles, legs, or abdomen. °· Unexplained weight gain. °· Difficulty breathing when lying flat (orthopnea). °· Waking from sleep because of the need to sit up and get more air. °· Rapid heartbeat. °· Fatigue and loss of energy. °· Feeling lightheaded, dizzy, or close to fainting. °· Loss of appetite. °· Nausea. °· Increased urination during the night (nocturia). °DIAGNOSIS  °A diagnosis of heart failure is based on your history, symptoms, physical examination, and diagnostic tests. °Diagnostic tests for heart failure may include: °· Echocardiography. °· Electrocardiography. °· Chest X-ray. °· Blood tests. °· Exercise   stress test. °· Cardiac angiography. °· Radionuclide scans. °TREATMENT  °Treatment is aimed at managing the symptoms of heart failure. Medicines, behavioral changes, or surgical intervention may be necessary to treat heart failure. °· Medicines to help treat heart failure may  include: °· Angiotensin-converting enzyme (ACE) inhibitors. This type of medicine blocks the effects of a blood protein called angiotensin-converting enzyme. ACE inhibitors relax (dilate) the blood vessels and help lower blood pressure. °· Angiotensin receptor blockers. This type of medicine blocks the actions of a blood protein called angiotensin. Angiotensin receptor blockers dilate the blood vessels and help lower blood pressure. °· Water pills (diuretics). Diuretics cause the kidneys to remove salt and water from the blood. The extra fluid is removed through urination. This loss of extra fluid lowers the volume of blood the heart pumps. °· Beta blockers. These prevent the heart from beating too fast and improve heart muscle strength. °· Digitalis. This increases the force of the heartbeat. °· Healthy behavior changes include: °· Obtaining and maintaining a healthy weight. °· Stopping smoking or chewing tobacco. °· Eating heart healthy foods. °· Limiting or avoiding alcohol. °· Stopping illicit drug use. °· Physical activity as directed by your caregiver. °· Surgical treatment for heart failure may include: °· A procedure to open blocked arteries, repair damaged heart valves, or remove damaged heart muscle tissue. °· A pacemaker to improve heart muscle function and control certain abnormal heart rhythms. °· An internal cardioverter defibrillator to treat certain serious abnormal heart rhythms. °· A left ventricular assist device to assist the pumping ability of the heart. °HOME CARE INSTRUCTIONS  °· Take your medicine as directed by your caregiver. Medicines are important in reducing the workload of your heart, slowing the progression of heart failure, and improving your symptoms. °· Do not stop taking your medicine unless directed by your caregiver. °· Do not skip any dose of medicine. °· Refill your prescriptions before you run out of medicine. Your medicines are needed every day. °· Take over-the-counter  medicine only as directed by your caregiver or pharmacist. °· Engage in moderate physical activity if directed by your caregiver. Moderate physical activity can benefit some people. The elderly and people with severe heart failure should consult with a caregiver for physical activity recommendations. °· Eat heart healthy foods. Food choices should be free of trans fat and low in saturated fat, cholesterol, and salt (sodium). Healthy choices include fresh or frozen fruits and vegetables, fish, lean meats, legumes, fat-free or low-fat dairy products, and whole grain or high fiber foods. Talk to a dietitian to learn more about heart healthy foods. °· Limit sodium if directed by your caregiver. Sodium restriction may reduce symptoms of heart failure in some people. Talk to a dietitian to learn more about heart healthy seasonings. °· Use healthy cooking methods. Healthy cooking methods include roasting, grilling, broiling, baking, poaching, steaming, or stir-frying. Talk to a dietitian to learn more about healthy cooking methods. °· Limit fluids if directed by your caregiver. Fluid restriction may reduce symptoms of heart failure in some people. °· Weigh yourself every day. Daily weights are important in the early recognition of excess fluid. You should weigh yourself every morning after you urinate and before you eat breakfast. Wear the same amount of clothing each time you weigh yourself. Record your daily weight. Provide your caregiver with your weight record. °· Monitor and record your blood pressure if directed by your caregiver. °· Check your pulse if directed by your caregiver. °· Lose weight if directed   by your caregiver. Weight loss may reduce symptoms of heart failure in some people. °· Stop smoking or chewing tobacco. Nicotine makes your heart work harder by causing your blood vessels to constrict. Do not use nicotine gum or patches before talking to your caregiver. °· Schedule and attend follow-up visits as  directed by your caregiver. It is important to keep all your appointments. °· Limit alcohol intake to no more than 1 drink per day for nonpregnant women and 2 drinks per day for men. Drinking more than that is harmful to your heart. Tell your caregiver if you drink alcohol several times a week. Talk with your caregiver about whether alcohol is safe for you. If your heart has already been damaged by alcohol or you have severe heart failure, drinking alcohol should be stopped completely. °· Stop illicit drug use. °· Stay up-to-date with immunizations. It is especially important to prevent respiratory infections through current pneumococcal and influenza immunizations. °· Manage other health conditions such as hypertension, diabetes, thyroid disease, or abnormal heart rhythms as directed by your caregiver. °· Learn to manage stress. °· Plan rest periods when fatigued. °· Learn strategies to manage high temperatures. If the weather is extremely hot: °· Avoid vigorous physical activity. °· Use air conditioning or fans or seek a cooler location. °· Avoid caffeine and alcohol. °· Wear loose-fitting, lightweight, and light-colored clothing. °· Learn strategies to manage cold temperatures. If the weather is extremely cold: °· Avoid vigorous physical activity. °· Layer clothes. °· Wear mittens or gloves, a hat, and a scarf when going outside. °· Avoid alcohol. °· Obtain ongoing education and support as needed. °· Participate or seek rehabilitation as needed to maintain or improve independence and quality of life. °SEEK MEDICAL CARE IF:  °· Your weight increases by 03 lb/1.4 kg in 1 day or 05 lb/2.3 kg in a week. °· You have increasing shortness of breath that is unusual for you. °· You are unable to participate in your usual physical activities. °· You tire easily. °· You cough more than normal, especially with physical activity. °· You have any or more swelling in areas such as your hands, feet, ankles, or abdomen. °· You  are unable to sleep because it is hard to breathe. °· You feel like your heart is beating fast (palpitations). °· You become dizzy or lightheaded upon standing up. °SEEK IMMEDIATE MEDICAL CARE IF:  °· You have difficulty breathing. °· There is a change in mental status such as decreased alertness or difficulty with concentration. °· You have a pain or discomfort in your chest. °· You have an episode of fainting (syncope). °MAKE SURE YOU:  °· Understand these instructions. °· Will watch your condition. °· Will get help right away if you are not doing well or get worse. °Document Released: 08/12/2005 Document Revised: 12/07/2012 Document Reviewed: 09/03/2012 °[employer]® Patient Information ©2014 [employer]. ° °

## 2013-11-17 NOTE — Discharge Summary (Signed)
Physician Discharge Summary  [name] RSW:[id number] DOB: [birth year] DOA: 11/12/2013  PCP: Wyatt Haste, MD  Admit date: 11/12/2013 Discharge date: 11/17/2013  Time spent: 45 minutes  Recommendations for Outpatient Follow-up:  Patient will be discharged to home. He will need help with his primary care physician within one week of discharge. He should have his BMP was the week of discharge. Patient to continue following a low sodium cardiac healthy as well as carb modified diet. He should also follow 1569mL per day fluid restriction.   Discharge Diagnoses:  Acute on chronic CHF Acute on chronic EKG, stage III Diabetes mellitus Hyperlipidemia Anemia Narcolepsy Noncompliance  Discharge Condition: Stable  Diet recommendation: Heart healthy  Filed Weights   11/14/13 0518 11/15/13 0639 11/16/13 0618  Weight: 105.325 kg (232 lb 3.2 oz) 103.057 kg (227 lb 3.2 oz) 103.012 kg (227 lb 1.6 oz)    History of present illness:  [name] is a 80 y.o. male who presented to the ED with complaints of increased swelling of both legs worsening over the past 2 weeks. He has also had SOB and orthopnea and DOE. He denies Chest pain. He has a documented medical history of Chronic Systolic CHF and Chronic lymphedema, and he had been on lasix at one time but not recently. He has a history of non-compliance with medications because he reports that the medications interfere with his ablity to function by worsening his narcolepsy. He has Diabetes and HTN and is not on any medications. He currently lives with one of his daughters.    Hospital Course:  CHF, acute on chronic:  - Was initially placed on IV Lasix, have good urine output - Creatinine did begin to trend up, Lasix withheld briefly - Fluid restrict, monitor electrolytes. Daily weights.  - 110.2->107.6->105-> 103.0 kg.  - continue lasix, lisinopril, Coreg  Acute on CKD (chronic kidney disease), stage III  - Cr 2.21,  improving,, Baseline Cr 1.8. Due to overdiuresis    Diabetes mellitus, Newly diagnose:  - Hemoglobin A1c 7.9 - Continue glipizide - Will need to followup with his primary care physician  Hyperlipidemia:  - Currently on no statin.  Anemia due to Iron deficiency:  - Iron deficiency and probably b12 deficiency ( level 342)  Narcolepsy:  - Self Reported, needs outpatient referral to Neurology.   Noncompliance:  - ? Poor Judgment, versus Life Choice.  - Patient currently alert and oriented will continue to questions appropriately. - Counseled on ERT be compliant with medications as well as diet.   Procedures: Echocardiogram  Study Conclusions - Left ventricle: The cavity size was normal. Wall thickness was increased in a pattern of mild LVH. Systolic function was normal. The estimated ejection fraction was in the range of 60% to 65%. Wall motion was normal; there were no regional wall motion abnormalities. - Right atrium: The atrium was mildly to moderately dilated.  Consultations: None  Discharge Exam: Filed Vitals:   11/17/13 0925  BP: 130/30  Pulse: 75  Temp:   Resp: 16     General: Well developed, well nourished, NAD, appears stated age  HEENT: NCAT, PERRLA, EOMI, Anicteic Sclera, mucous membranes moist.   Neck: Supple, no JVD, no masses  Cardiovascular: S1 S2 auscultated, Regular rate and rhythm.  Respiratory: Clear to auscultation bilaterally with equal chest rise  Abdomen: Soft, nontender, nondistended, + bowel sounds  Extremities: warm dry without cyanosis clubbing or edema  Neuro: AAOx3, cranial nerves grossly intact.   Skin: Without rashes exudates or nodules  Psych: Normal affect and demeanor with intact judgement and insight  Discharge Instructions      Discharge Orders   Future Appointments Provider Department Dept Phone   01/10/2014 10:30 AM Kendell Bane, [place] [facility] [place]   Future Orders Complete By Expires    Diet - low sodium heart healthy  As directed    Discharge instructions  As directed    Comments:     Patient will be discharged to home. He will need help with his primary care physician within one week of discharge. He should have his BMP was the week of discharge. Patient to continue following a low sodium cardiac healthy as well as carb modified diet. He should also follow 1533mL per day fluid restriction.   Increase activity slowly  As directed        Medication List         carvedilol 3.125 MG tablet  Commonly known as:  COREG  Take 1 tablet (3.125 mg total) by mouth 2 (two) times daily with a meal.     glipiZIDE 2.5 mg Tabs tablet  Commonly known as:  GLUCOTROL  Take 0.5 tablets (2.5 mg total) by mouth daily before breakfast.     lisinopril 5 MG tablet  Commonly known as:  PRINIVIL,ZESTRIL  Take 1 tablet (5 mg total) by mouth daily.       No Known Allergies Follow-up Information   Follow up with Wyatt Haste, MD. Schedule an appointment as soon as possible for a visit in 1 week.   Specialty:  Family Medicine   Contact information:   [address] [place] [id number] [phone]        The results of significant diagnostics from this hospitalization (including imaging, microbiology, ancillary and laboratory) are listed below for reference.    Significant Diagnostic Studies: Dg Chest 2 View  11/12/2013   CLINICAL DATA:  Edema  EXAM: CHEST  2 VIEW  COMPARISON:  07/21/2013  FINDINGS: Pulmonary vascular congestion, chronic. No frank interstitial edema. No pleural effusion or pneumothorax.  Mild cardiomegaly.  Degenerative changes of the visualized thoracolumbar spine.  IMPRESSION: Cardiomegaly with pulmonary vascular congestion. No frank interstitial edema   Electronically Signed   By: Julian Hy M.D.   On: 11/12/2013 18:38    Microbiology: No results found for this or any previous visit (from the past 240 hour(s)).   Labs: Basic  Metabolic Panel:  Recent Labs Lab 11/13/13 0520 11/14/13 0610 11/15/13 0430 11/16/13 0505 11/17/13 0535  NA 140 138 138 138 137  K 4.1 3.8 3.9 4.2 4.7  CL 101 96 94* 92* 93*  CO2 28 29 31 31 29   GLUCOSE 198* 162* 147* 132* 134*  BUN 21 26* 33* 44* 47*  CREATININE 1.86* 1.93* 2.17* 2.30* 2.21*  CALCIUM 8.7 9.0 9.2 8.8 8.5   Liver Function Tests:  Recent Labs Lab 11/12/13 1455  AST 9  ALT 8  ALKPHOS 74  BILITOT 0.3  PROT 6.8  ALBUMIN 3.3*   No results found for this basename: LIPASE, AMYLASE,  in the last 168 hours No results found for this basename: AMMONIA,  in the last 168 hours CBC:  Recent Labs Lab 11/12/13 1455  WBC 3.6*  NEUTROABS 2.1  HGB 8.7*  HCT 27.1*  MCV 84.4  PLT 235   Cardiac Enzymes:  Recent Labs Lab 11/12/13 1455  TROPONINI <0.30   BNP: BNP (last 3 results)  Recent Labs  07/22/13 0341 07/27/13 0105 11/12/13 1455  PROBNP 1758.0* 1267.0* 2435.0*   CBG:  Recent Labs Lab 11/15/13 2129 11/16/13 0600 11/16/13 1144 11/16/13 1604 11/17/13 0545  GLUCAP 182* 130* 230* 191* 120*       Signed:  Kamea Dacosta  [facility] 11/17/2013, 10:24 AM
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
## 2013-11-22 ENCOUNTER — Inpatient Hospital Stay: Payer: Medicare Other | Admitting: Family Medicine

## 2013-12-08 ENCOUNTER — Encounter (HOSPITAL_COMMUNITY): Payer: Self-pay | Admitting: Emergency Medicine

## 2013-12-08 ENCOUNTER — Emergency Department (HOSPITAL_COMMUNITY)
Admission: EM | Admit: 2013-12-08 | Discharge: 2013-12-08 | Disposition: A | Discharge status: Home / Self Care | Payer: Medicare Other | Attending: Emergency Medicine | Admitting: Emergency Medicine

## 2013-12-08 ENCOUNTER — Emergency Department (HOSPITAL_COMMUNITY): Payer: Medicare Other

## 2013-12-08 DIAGNOSIS — Z9889 Other specified postprocedural states: Secondary | ICD-10-CM | POA: Insufficient documentation

## 2013-12-08 DIAGNOSIS — E669 Obesity, unspecified: Secondary | ICD-10-CM | POA: Insufficient documentation

## 2013-12-08 DIAGNOSIS — E1142 Type 2 diabetes mellitus with diabetic polyneuropathy: Secondary | ICD-10-CM | POA: Insufficient documentation

## 2013-12-08 DIAGNOSIS — Z658 Other specified problems related to psychosocial circumstances: Secondary | ICD-10-CM

## 2013-12-08 DIAGNOSIS — I2510 Atherosclerotic heart disease of native coronary artery without angina pectoris: Secondary | ICD-10-CM | POA: Insufficient documentation

## 2013-12-08 DIAGNOSIS — I129 Hypertensive chronic kidney disease with stage 1 through stage 4 chronic kidney disease, or unspecified chronic kidney disease: Secondary | ICD-10-CM | POA: Insufficient documentation

## 2013-12-08 DIAGNOSIS — Z87448 Personal history of other diseases of urinary system: Secondary | ICD-10-CM | POA: Insufficient documentation

## 2013-12-08 DIAGNOSIS — Z8739 Personal history of other diseases of the musculoskeletal system and connective tissue: Secondary | ICD-10-CM | POA: Insufficient documentation

## 2013-12-08 DIAGNOSIS — Z638 Other specified problems related to primary support group: Secondary | ICD-10-CM | POA: Insufficient documentation

## 2013-12-08 DIAGNOSIS — M7989 Other specified soft tissue disorders: Secondary | ICD-10-CM | POA: Insufficient documentation

## 2013-12-08 DIAGNOSIS — E1149 Type 2 diabetes mellitus with other diabetic neurological complication: Secondary | ICD-10-CM | POA: Insufficient documentation

## 2013-12-08 DIAGNOSIS — Z8619 Personal history of other infectious and parasitic diseases: Secondary | ICD-10-CM | POA: Insufficient documentation

## 2013-12-08 DIAGNOSIS — Z87891 Personal history of nicotine dependence: Secondary | ICD-10-CM | POA: Insufficient documentation

## 2013-12-08 DIAGNOSIS — I5022 Chronic systolic (congestive) heart failure: Secondary | ICD-10-CM | POA: Insufficient documentation

## 2013-12-08 DIAGNOSIS — Z862 Personal history of diseases of the blood and blood-forming organs and certain disorders involving the immune mechanism: Secondary | ICD-10-CM | POA: Insufficient documentation

## 2013-12-08 DIAGNOSIS — N189 Chronic kidney disease, unspecified: Secondary | ICD-10-CM | POA: Insufficient documentation

## 2013-12-08 LAB — COMPREHENSIVE METABOLIC PANEL
ALBUMIN: 3.6 g/dL (ref 3.5–5.2)
ALT: 6 U/L (ref 0–53)
AST: 8 U/L (ref 0–37)
Alkaline Phosphatase: 58 U/L (ref 39–117)
BUN: 22 mg/dL (ref 6–23)
CALCIUM: 9 mg/dL (ref 8.4–10.5)
CO2: 24 mEq/L (ref 19–32)
Chloride: 108 mEq/L (ref 96–112)
Creatinine, Ser: 1.55 mg/dL — ABNORMAL HIGH (ref 0.50–1.35)
GFR calc Af Amer: 48 mL/min — ABNORMAL LOW (ref >90)
GFR, EST NON AFRICAN AMERICAN: 41 mL/min — AB (ref >90)
Glucose, Bld: 120 mg/dL — ABNORMAL HIGH (ref 70–99)
Potassium: 4 mEq/L (ref 3.7–5.3)
SODIUM: 144 meq/L (ref 137–147)
Total Bilirubin: 0.4 mg/dL (ref 0.3–1.2)
Total Protein: 7.1 g/dL (ref 6.0–8.3)

## 2013-12-08 LAB — CBC WITH DIFFERENTIAL/PLATELET
BASOS ABS: 0 10*3/uL (ref 0.0–0.1)
BASOS PCT: 0 % (ref 0–1)
EOS PCT: 1 % (ref 0–5)
Eosinophils Absolute: 0 10*3/uL (ref 0.0–0.7)
HCT: 29.6 % — ABNORMAL LOW (ref 39.0–52.0)
Hemoglobin: 9.9 g/dL — ABNORMAL LOW (ref 13.0–17.0)
Lymphocytes Relative: 26 % (ref 12–46)
Lymphs Abs: 0.8 10*3/uL (ref 0.7–4.0)
MCH: 27.9 pg (ref 26.0–34.0)
MCHC: 33.4 g/dL (ref 30.0–36.0)
MCV: 83.4 fL (ref 78.0–100.0)
Monocytes Absolute: 0.5 10*3/uL (ref 0.1–1.0)
Monocytes Relative: 16 % — ABNORMAL HIGH (ref 3–12)
Neutro Abs: 1.7 10*3/uL (ref 1.7–7.7)
Neutrophils Relative %: 56 % (ref 43–77)
PLATELETS: 113 10*3/uL — AB (ref 150–400)
RBC: 3.55 MIL/uL — ABNORMAL LOW (ref 4.22–5.81)
RDW: 15.2 % (ref 11.5–15.5)
WBC: 3 10*3/uL — ABNORMAL LOW (ref 4.0–10.5)

## 2013-12-08 LAB — PRO B NATRIURETIC PEPTIDE: Pro B Natriuretic peptide (BNP): 1417 pg/mL — ABNORMAL HIGH (ref 0–450)

## 2013-12-08 LAB — I-STAT TROPONIN, ED: Troponin i, poc: 0.02 ng/mL (ref 0.00–0.08)

## 2013-12-08 MED ORDER — FUROSEMIDE 40 MG PO TABS: 40.0000 mg | ORAL_TABLET | Freq: Every day | ORAL | Status: DC

## 2013-12-08 MED ORDER — LISINOPRIL 5 MG PO TABS: 10.0000 mg | ORAL_TABLET | Freq: Every day | ORAL | Status: DC

## 2013-12-08 MED ORDER — CARVEDILOL 3.125 MG PO TABS: 3.1250 mg | ORAL_TABLET | Freq: Two times a day (BID) | ORAL | Status: DC

## 2013-12-08 MED ORDER — FUROSEMIDE 10 MG/ML IJ SOLN
40.0000 mg | INTRAMUSCULAR | Status: AC
  Administered 2013-12-08: 40 mg via INTRAVENOUS
  Filled 2013-12-08: qty 4

## 2013-12-08 MED ORDER — GLIPIZIDE 2.5 MG HALF TABLET: 2.5000 mg | ORAL_TABLET | Freq: Every day | ORAL | Status: DC

## 2013-12-08 NOTE — ED Notes (Signed)
When attempting an EKG patient refused and said he wanted to leave, I informed the PA. Patient is very angry with family and uncooperative.

## 2013-12-08 NOTE — ED Notes (Signed)
Patient transported to X-ray 

## 2013-12-08 NOTE — Discharge Instructions (Signed)
Call for a follow up appointment with a Family or Primary Care Provider.  °Return if Symptoms worsen.   °Take medication as prescribed.  ° °

## 2013-12-08 NOTE — ED Notes (Signed)
Pt states he doesn't take any medications for his BP and doesn't want to. States he went to get some medications refilled the other day but it wasn't for BP, it was something for a clot.

## 2013-12-08 NOTE — ED Notes (Signed)
Social work in room at this time. 

## 2013-12-08 NOTE — ED Provider Notes (Signed)
CSN: [id number]     Arrival date & time 12/08/13  1337 History   First MD Initiated Contact with Patient 12/08/13 1503     Chief Complaint  Patient presents with  . Leg Swelling     (Consider location/radiation/quality/duration/timing/severity/associated sxs/prior Treatment) HPI Comments: [name] is a 80 y.o. male with a past medical history of CHF, Obesity, HTN, CAD, A-fib presenting the Emergency Department with a chief complaint of increase in swelling of bilateral lower extremities.  The patient reports increase in swelling since his discharge on 11/17/2013.  He denies medication since then and states he has not followed up since his discharge.  He denies headache, decrease in urine output, chest pain, shortness of breath, resent vision changes.   When questioned about his blood pressure he reports "if it's not high I'm dead".   He reports he came in to "find a place to live".      The history is provided by the patient and medical records. No language interpreter was used.    Past Medical History  Diagnosis Date  . Smoker   . Narcolepsy   . Hypertension   . BPH (benign prostatic hyperplasia)   . Arthritis   . Diabetes mellitus   . Neuropathy in diabetes   . Herpes   . Dyslipidemia   . Obesity   . Lymphedema     LE lymphedemia with hx of cellulitis.  . CKD (chronic kidney disease)   . Anemia   . History of GI bleed 06/2012    EGD unremarkable;  Colo with large cecal polyp (bx benign)  . Ischemic cardiomyopathy     Echo 06/19/12: EF 40-45%, mid to distal anteroseptal HK, moderate LAE, PASP 37  . Chronic systolic heart failure   . CAD (coronary artery disease)     a. probable underlying CAD;  b. Type 2 NSTEMi in setting of profound anemia (Hgb 3.6) from GI bleed in 06/2012 and WMA on echo;  c. med Rx due to CKD and GI bleed  . CHF (congestive heart failure)   . Atrial fibrillation    Past Surgical History  Procedure Laterality Date  . Leg surgery      Left  leg surgery. broken leg  . Esophagogastroduodenoscopy  06/19/2012    Procedure: ESOPHAGOGASTRODUODENOSCOPY (EGD);  Surgeon: Beryle Beams, MD;  Location: [facility] ENDOSCOPY;  Service: Endoscopy;  Laterality: N/A;  . Colonoscopy  06/21/2012    Procedure: COLONOSCOPY;  Surgeon: Juanita Craver, MD;  Location: [facility] ENDOSCOPY;  Service: Endoscopy;  Laterality: N/A;   No family history on file. History  Substance Use Topics  . Smoking status: Former Smoker    Quit date: 08/26/1998  . Smokeless tobacco: Never Used  . Alcohol Use: No    Review of Systems    Allergies  Review of patient's allergies indicates no known allergies.  Home Medications   Prior to Admission medications   Not on File   BP 227/75  Pulse 67  Temp(Src) 98.1 F (36.7 C) (Oral)  Resp 18  Wt 247 lb (112.038 kg)  SpO2 100% Physical Exam  Nursing note and vitals reviewed. Constitutional: He is oriented to person, place, and time. He appears well-developed and well-nourished. No distress.  HENT:  Head: Normocephalic and atraumatic.  Eyes: EOM are normal. Pupils are equal, round, and reactive to light.  Neck: Neck supple.  Cardiovascular: Normal rate and regular rhythm.   Bilateral 2+ pitting edema extending to infrapatella. Chronic skin changes to bilateral lower extremities.  Pulmonary/Chest: Effort normal. No respiratory distress. He has no wheezes. He has no rales. He exhibits no tenderness.  Abdominal: Soft. There is no tenderness. There is no rebound.  Musculoskeletal: Normal range of motion.  Neurological: He is alert and oriented to person, place, and time.  Skin: Skin is warm and dry. He is not diaphoretic.  Psychiatric: He has a normal mood and affect. His behavior is normal.    ED Course  Procedures (including critical care time) Labs Review Labs Reviewed  CBC WITH DIFFERENTIAL - Abnormal; Notable for the following:    WBC 3.0 (*)    RBC 3.55 (*)    Hemoglobin 9.9 (*)    HCT 29.6 (*)    Platelets 113  (*)    Monocytes Relative 16 (*)    All other components within normal limits  COMPREHENSIVE METABOLIC PANEL - Abnormal; Notable for the following:    Glucose, Bld 120 (*)    Creatinine, Ser 1.55 (*)    GFR calc non Af Amer 41 (*)    GFR calc Af Amer 48 (*)    All other components within normal limits  PRO B NATRIURETIC PEPTIDE - Abnormal; Notable for the following:    Pro B Natriuretic peptide (BNP) 1417.0 (*)    All other components within normal limits  I-STAT TROPOININ, ED    Imaging Review Dg Chest 2 View  12/08/2013   CLINICAL DATA:  Lower extremity swelling with history of coronary artery disease and congestive heart failure and diabetes  EXAM: CHEST  2 VIEW  COMPARISON:  DG CHEST 2 VIEW dated 11/12/2013  FINDINGS: Knee study is taken in somewhat lordotic position. The lungs are mildly hypoinflated. There is no alveolar infiltrate. There is no pleural effusion. The left heart border is partially obscured. The pulmonary vascularity is not clearly engorged.  IMPRESSION: The study is limited in terms of patient positioning. There is no definite evidence of CHF. A straight AP positioned film is recommended.   Electronically Signed   By: David  Martinique   On: 12/08/2013 17:11     EKG Interpretation   Date/Time:  Wednesday December 08 2013 15:35:11 EDT Ventricular Rate:  63 PR Interval:  229 QRS Duration: 94 QT Interval:  450 QTC Calculation: 461 R Axis:   -14 Text Interpretation:  Sinus arrhythmia Prolonged PR interval Probable LVH  with secondary repol abnrm Anterior ST elevation, probably due to LVH No  significant change since last tracing Confirmed by Maryan Rued  MD, Loree Fee  [phone]) on 12/08/2013 4:15:51 PM      MDM   Final diagnoses:  Leg swelling  Domestic problems   Pt is fixated on finding a place and is angry with his daughter to live and poor insight into health condition, non-compliance with therapy since his discharge.  BP was 221/84 on arrival, changes when the  patient is calm and not angry or speaking about his daughter. Dr. Maryan Rued also evaluated the patient, history of lymphedema, agrees on current evaluation and out patient follow up.  Chest XR shows poor postioning of the pate int, no obvious signs of CHF. Consult to Case manager. BNP 1417, 11/12/2013: 2435. Cr: 1.55, Renal function improved since 3 weeks ago. Labs improved since 3 weeks ago. Case manager to help pateint get his medication, set up PCP follow up and help with currently living situation.  Patient is stable for discharge at this time.  Meds given in ED:  Medications - No data to display  Discharge Medication List  as of 12/08/2013  7:15 PM    START taking these medications   Details  carvedilol (COREG) 3.125 MG tablet Take 1 tablet (3.125 mg total) by mouth 2 (two) times daily with a meal., Starting 12/08/2013, Until Discontinued, Print    furosemide (LASIX) 40 MG tablet Take 1 tablet (40 mg total) by mouth daily., Starting 12/08/2013, Until Discontinued, Print    glipiZIDE (GLUCOTROL) 2.5 mg TABS tablet Take 0.5 tablets (2.5 mg total) by mouth daily before breakfast., Starting 12/08/2013, Until Discontinued, Print    lisinopril (PRINIVIL,ZESTRIL) 5 MG tablet Take 2 tablets (10 mg total) by mouth daily., Starting 12/08/2013, Until Discontinued, Print            Lorrine Kin, PA-C 12/10/13 0225

## 2013-12-08 NOTE — ED Notes (Signed)
Patient is extremely angry and states he does not have a place to live.

## 2013-12-08 NOTE — Progress Notes (Signed)
12/08/13 18:34 Wendi Maya RN BSN 336 [phone]  Chest Xray unremarkable. Pt was medically clear for discharge. Pt reports that he has not f/u with PCP due to not having an appointment. Offered to make f/u appointment with PCP patient agreed. Pt requested to assistance with apt. Issue. Requesting phone call be made to f/u pt states, he is unclear of the process. Discussed that I will notify him tomorrow with PCP appt.  And I offered to contact the senior apartment to find out the process. Pt is appreciative and agrees with Plan. Discussed plan with L. Parker PA-C. She agrees with discharge plan. Prescriptions faxed to [facility] [phone] fax receipt received.  ED CM will follow up with patient tomorrow patient verbalizes understanding teach back done.   12/08/13 06:15 Wendi Maya RN [phone] ED CM received consult from Jorene Minors PA-C concerning patient requesting assistance with housing, . Pt presented to [facility] ED with c/o bilateral leg swelling. Pt was discharged from [facility] ED  11/17/13, and has not had f/u care. He has not had medications as per patient, he claims he did not receive any prescriptions upon discharge. Pt voices that he gets his prescriptions at [place] was recently discharged from [facility], recommendation was for SNF placement. Pt declined SNF and HH at the time of discharge. Pt stated, that he lives with daughter the environment is unclean as per patient. He stated, "I want my own apartment."B/P CBC, CMP, Troponin BNP, all unremarkable.  CXR still pending. ED CSW was contacted. Pt still being evaluated.

## 2013-12-08 NOTE — ED Notes (Signed)
Rn entered room to assess patient and brought in IV supplies. Patient upset sts "I aint letting you stick me. Im here to get an apartment and you are trying to do all this stuff I dont want." RN explained procedures, protocols and assessment findings. Patient upset about not having apartment to stay in and care from daughter. RN sts consult is put in for care management and explaining importance of labs and lasix and patients rights. Patient requesting to speak to provider

## 2013-12-08 NOTE — ED Notes (Signed)
Pt states he is not here for leg swelling but he needs an apartment. He currently lives with his daughter and a "nasty little dog". States his legs started swelling worse after being discharged from here last week. Has a hx of CHF but pt states he doesn't. Denies any pain.

## 2013-12-09 NOTE — Progress Notes (Signed)
ED CM contacted PCP Dr. [facility] office and scheduled appt for 12/13/13 @1045 . Patient notified of appt.  Informed patient if he is unable to keep appointment to contact the office at least 24 hours prior Pt verbalized understanding. Teach back done. Pt states, he pick up medications from [facility]. .Informed patient that I was unable to reach the [employer]. [place]. [phone]. Encourage to patient continue to try to reach them by phone. Pt verbalizes understanding and is agreeable. No further ED CM needs identified

## 2013-12-09 NOTE — Progress Notes (Signed)
EPIC read of discharge medications to Katharine Look, Software engineer, at [facility]. Edwyna Shell, RN, BSN, Case Managers 12/09/2013 11:23 AM

## 2013-12-10 NOTE — ED Provider Notes (Signed)
Medical screening examination/treatment/procedure(s) were conducted as a shared visit with non-physician practitioner(s) and myself.  I personally evaluated the patient during the encounter.   EKG Interpretation   Date/Time:  Wednesday December 08 2013 15:35:11 EDT Ventricular Rate:  63 PR Interval:  229 QRS Duration: 94 QT Interval:  450 QTC Calculation: 461 R Axis:   -14 Text Interpretation:  Sinus arrhythmia Prolonged PR interval Probable LVH  with secondary repol abnrm Anterior ST elevation, probably due to LVH No  significant change since last tracing Confirmed by [facility]  MD, Loree Fee  [phone]) on 12/08/2013 4:15:51 PM      Patient presenting with the complaint that he needs to find a place to live. He has a significant history of CHF and recently admitted several weeks ago and since discharge home has not been taking Lasix. He has evidence of lower strandy edema but denies any shortness of breath. Labs are improved from prior deal he can return to taking his dose of Lasix. Also spoke with social worker about his living situation and ongoing options  Blanchie Dessert, MD 12/10/13 [phone]

## 2013-12-13 ENCOUNTER — Encounter: Payer: Self-pay | Admitting: Family Medicine

## 2013-12-13 ENCOUNTER — Ambulatory Visit (INDEPENDENT_AMBULATORY_CARE_PROVIDER_SITE_OTHER): Payer: Medicare Other | Admitting: Family Medicine

## 2013-12-13 VITALS — BP 140/70 | HR 56 | Wt 247.0 lb

## 2013-12-13 DIAGNOSIS — I890 Lymphedema, not elsewhere classified: Secondary | ICD-10-CM

## 2013-12-13 DIAGNOSIS — Z9119 Patient's noncompliance with other medical treatment and regimen: Secondary | ICD-10-CM

## 2013-12-13 DIAGNOSIS — Z91199 Patient's noncompliance with other medical treatment and regimen due to unspecified reason: Secondary | ICD-10-CM

## 2013-12-13 DIAGNOSIS — Z9114 Patient's other noncompliance with medication regimen: Secondary | ICD-10-CM

## 2013-12-13 DIAGNOSIS — E119 Type 2 diabetes mellitus without complications: Secondary | ICD-10-CM

## 2013-12-13 NOTE — Progress Notes (Signed)
   Subjective:    Patient ID: [name], male    DOB: 04-21-1934, 80 y.o.   MRN: [id number]  HPI He is here for a recheck. He was recently seen in the emergency room. He goes to the emergency room frequently for his leg edema. He has been having difficulty with this for the last several years. With proper care the edema does go way however he stopped taking care of himself and the swelling comes back. He again start talking about his finances and his daughter. He does complain of leg swelling but no pain. The ER record was reviewed. The social worker in the ED is trying to help him get placed in [place].   Review of Systems     Objective:   Physical Exam Alert and in no distress. Exam of both legs does show extensive edema from the upper calf distal.       Assessment & Plan:  Noncompliance with medications  Diabetes mellitus  Chronic acquired lymphedema  we will call [facility] to see if they can come out and help with his chronic lymphedema. He will continue to work with the Education officer, museum from the ED to see if she can help place him in [place]. Recheck here in one or 2 months.

## 2014-01-10 ENCOUNTER — Ambulatory Visit: Payer: Medicare Other | Admitting: Podiatry

## 2014-01-10 ENCOUNTER — Telehealth: Payer: Self-pay | Admitting: Internal Medicine

## 2014-01-10 MED ORDER — FUROSEMIDE 40 MG PO TABS: 40.0000 mg | ORAL_TABLET | Freq: Every day | ORAL | Status: DC

## 2014-01-10 MED ORDER — LISINOPRIL 5 MG PO TABS: 10.0000 mg | ORAL_TABLET | Freq: Every day | ORAL | Status: DC

## 2014-01-10 MED ORDER — GLIPIZIDE 2.5 MG HALF TABLET: 2.5000 mg | ORAL_TABLET | Freq: Every day | ORAL | Status: DC

## 2014-01-10 NOTE — Telephone Encounter (Signed)
Zigmund Daniel the nurse manger from [facility] called stating that pt bp was 211/100 but he has been out of 3 meds for 2 weeks. Zigmund Daniel says she wants and ok order to continue to monitor him for a couple weeks to make sure its just the medication. Can you give a verbal ok to continue for a nurse to come out.

## 2014-01-11 NOTE — Telephone Encounter (Signed)
[place] for nurse to come out and do home assessment regarding blood pressure and compliance. shane

## 2014-01-11 NOTE — Telephone Encounter (Signed)
I called and left a message for [name] the Midwife. CLS

## 2014-01-12 ENCOUNTER — Telehealth: Payer: Self-pay | Admitting: Family Medicine

## 2014-01-12 NOTE — Telephone Encounter (Signed)
Tried to call pt. Pt needs an appt per jcl.

## 2014-01-13 ENCOUNTER — Telehealth: Payer: Self-pay | Admitting: Family Medicine

## 2014-01-13 NOTE — Telephone Encounter (Signed)
Pt was reached and appt was made

## 2014-01-14 ENCOUNTER — Telehealth: Payer: Self-pay | Admitting: Internal Medicine

## 2014-01-14 NOTE — Telephone Encounter (Signed)
Faxed over medical records to [facility] department of social services

## 2014-01-19 ENCOUNTER — Ambulatory Visit: Payer: Medicare Other | Admitting: Family Medicine

## 2014-01-21 ENCOUNTER — Telehealth: Payer: Self-pay | Admitting: Family Medicine

## 2014-01-21 NOTE — Telephone Encounter (Signed)
[name] with [name] called and stated that [name] was beng dismissed per pt request. She states he was yelling saying that there was nothing wrong with him. That he wasn't taking his meds. She states the condition in the home is really bad and they have turned his name into proper authorities. This information was relayed to [facility] verbally.

## 2014-01-26 ENCOUNTER — Encounter: Payer: Self-pay | Admitting: Podiatry

## 2014-01-26 ENCOUNTER — Encounter: Payer: Self-pay | Admitting: Family Medicine

## 2014-01-26 ENCOUNTER — Ambulatory Visit (INDEPENDENT_AMBULATORY_CARE_PROVIDER_SITE_OTHER): Payer: Medicare Other | Admitting: Podiatry

## 2014-01-26 VITALS — BP 160/69 | HR 78 | Resp 17 | Ht 71.5 in | Wt 258.0 lb

## 2014-01-26 DIAGNOSIS — M79609 Pain in unspecified limb: Secondary | ICD-10-CM

## 2014-01-26 DIAGNOSIS — B351 Tinea unguium: Secondary | ICD-10-CM

## 2014-01-27 NOTE — Progress Notes (Signed)
Patient ID: [name], male   DOB: 1933/11/10, 80 y.o.   MRN: [id number]  Subjective: This patient presents for ongoing debridement of painful toenails  Objective: Poor hygiene noted Elongated, hypertrophic, discolored, brittle toenails x10  Assessment: Symptomatic onychomycoses x10  Plan: Debrided toenails x10 without a bleeding  Reappoint at three-month intervals

## 2014-02-11 ENCOUNTER — Emergency Department (HOSPITAL_COMMUNITY): Payer: Medicare Other

## 2014-02-11 ENCOUNTER — Encounter (HOSPITAL_COMMUNITY): Payer: Self-pay | Admitting: Emergency Medicine

## 2014-02-11 ENCOUNTER — Inpatient Hospital Stay (HOSPITAL_COMMUNITY)
Admission: EM | Admit: 2014-02-11 | Discharge: 2014-02-25 | DRG: 330 | Disposition: A | Discharge status: Skilled Nursing Facility | Payer: Medicare Other | Attending: Internal Medicine | Admitting: Internal Medicine

## 2014-02-11 DIAGNOSIS — N189 Chronic kidney disease, unspecified: Secondary | ICD-10-CM

## 2014-02-11 DIAGNOSIS — N400 Enlarged prostate without lower urinary tract symptoms: Secondary | ICD-10-CM | POA: Diagnosis present

## 2014-02-11 DIAGNOSIS — K922 Gastrointestinal hemorrhage, unspecified: Secondary | ICD-10-CM

## 2014-02-11 DIAGNOSIS — I2589 Other forms of chronic ischemic heart disease: Secondary | ICD-10-CM | POA: Diagnosis present

## 2014-02-11 DIAGNOSIS — Z833 Family history of diabetes mellitus: Secondary | ICD-10-CM

## 2014-02-11 DIAGNOSIS — N039 Chronic nephritic syndrome with unspecified morphologic changes: Secondary | ICD-10-CM

## 2014-02-11 DIAGNOSIS — E785 Hyperlipidemia, unspecified: Secondary | ICD-10-CM

## 2014-02-11 DIAGNOSIS — C180 Malignant neoplasm of cecum: Principal | ICD-10-CM | POA: Diagnosis present

## 2014-02-11 DIAGNOSIS — Z808 Family history of malignant neoplasm of other organs or systems: Secondary | ICD-10-CM

## 2014-02-11 DIAGNOSIS — K560 Paralytic ileus: Secondary | ICD-10-CM | POA: Diagnosis not present

## 2014-02-11 DIAGNOSIS — K921 Melena: Secondary | ICD-10-CM | POA: Diagnosis present

## 2014-02-11 DIAGNOSIS — I4891 Unspecified atrial fibrillation: Secondary | ICD-10-CM | POA: Diagnosis present

## 2014-02-11 DIAGNOSIS — R600 Localized edema: Secondary | ICD-10-CM

## 2014-02-11 DIAGNOSIS — Z9119 Patient's noncompliance with other medical treatment and regimen: Secondary | ICD-10-CM

## 2014-02-11 DIAGNOSIS — E1165 Type 2 diabetes mellitus with hyperglycemia: Secondary | ICD-10-CM

## 2014-02-11 DIAGNOSIS — I5032 Chronic diastolic (congestive) heart failure: Secondary | ICD-10-CM | POA: Diagnosis present

## 2014-02-11 DIAGNOSIS — I160 Hypertensive urgency: Secondary | ICD-10-CM

## 2014-02-11 DIAGNOSIS — E1142 Type 2 diabetes mellitus with diabetic polyneuropathy: Secondary | ICD-10-CM | POA: Diagnosis present

## 2014-02-11 DIAGNOSIS — D72819 Decreased white blood cell count, unspecified: Secondary | ICD-10-CM

## 2014-02-11 DIAGNOSIS — I509 Heart failure, unspecified: Secondary | ICD-10-CM

## 2014-02-11 DIAGNOSIS — I129 Hypertensive chronic kidney disease with stage 1 through stage 4 chronic kidney disease, or unspecified chronic kidney disease: Secondary | ICD-10-CM | POA: Diagnosis present

## 2014-02-11 DIAGNOSIS — E1129 Type 2 diabetes mellitus with other diabetic kidney complication: Secondary | ICD-10-CM | POA: Diagnosis present

## 2014-02-11 DIAGNOSIS — K929 Disease of digestive system, unspecified: Secondary | ICD-10-CM | POA: Diagnosis present

## 2014-02-11 DIAGNOSIS — D126 Benign neoplasm of colon, unspecified: Secondary | ICD-10-CM | POA: Diagnosis present

## 2014-02-11 DIAGNOSIS — C189 Malignant neoplasm of colon, unspecified: Secondary | ICD-10-CM

## 2014-02-11 DIAGNOSIS — K635 Polyp of colon: Secondary | ICD-10-CM

## 2014-02-11 DIAGNOSIS — D490 Neoplasm of unspecified behavior of digestive system: Secondary | ICD-10-CM

## 2014-02-11 DIAGNOSIS — D500 Iron deficiency anemia secondary to blood loss (chronic): Secondary | ICD-10-CM | POA: Diagnosis present

## 2014-02-11 DIAGNOSIS — D631 Anemia in chronic kidney disease: Secondary | ICD-10-CM | POA: Diagnosis present

## 2014-02-11 DIAGNOSIS — E1149 Type 2 diabetes mellitus with other diabetic neurological complication: Secondary | ICD-10-CM | POA: Diagnosis present

## 2014-02-11 DIAGNOSIS — I252 Old myocardial infarction: Secondary | ICD-10-CM

## 2014-02-11 DIAGNOSIS — Z91199 Patient's noncompliance with other medical treatment and regimen due to unspecified reason: Secondary | ICD-10-CM

## 2014-02-11 DIAGNOSIS — N179 Acute kidney failure, unspecified: Secondary | ICD-10-CM | POA: Diagnosis not present

## 2014-02-11 DIAGNOSIS — N058 Unspecified nephritic syndrome with other morphologic changes: Secondary | ICD-10-CM | POA: Diagnosis present

## 2014-02-11 DIAGNOSIS — Y832 Surgical operation with anastomosis, bypass or graft as the cause of abnormal reaction of the patient, or of later complication, without mention of misadventure at the time of the procedure: Secondary | ICD-10-CM | POA: Diagnosis not present

## 2014-02-11 DIAGNOSIS — D62 Acute posthemorrhagic anemia: Secondary | ICD-10-CM | POA: Diagnosis present

## 2014-02-11 DIAGNOSIS — I2510 Atherosclerotic heart disease of native coronary artery without angina pectoris: Secondary | ICD-10-CM | POA: Diagnosis present

## 2014-02-11 DIAGNOSIS — I890 Lymphedema, not elsewhere classified: Secondary | ICD-10-CM | POA: Diagnosis present

## 2014-02-11 DIAGNOSIS — N183 Chronic kidney disease, stage 3 unspecified: Secondary | ICD-10-CM | POA: Diagnosis present

## 2014-02-11 DIAGNOSIS — K648 Other hemorrhoids: Secondary | ICD-10-CM | POA: Diagnosis present

## 2014-02-11 DIAGNOSIS — Z87891 Personal history of nicotine dependence: Secondary | ICD-10-CM

## 2014-02-11 DIAGNOSIS — Z6835 Body mass index (BMI) 35.0-35.9, adult: Secondary | ICD-10-CM

## 2014-02-11 DIAGNOSIS — D649 Anemia, unspecified: Secondary | ICD-10-CM

## 2014-02-11 DIAGNOSIS — M129 Arthropathy, unspecified: Secondary | ICD-10-CM | POA: Diagnosis present

## 2014-02-11 DIAGNOSIS — G47419 Narcolepsy without cataplexy: Secondary | ICD-10-CM | POA: Diagnosis present

## 2014-02-11 DIAGNOSIS — Z9114 Patient's other noncompliance with medication regimen: Secondary | ICD-10-CM

## 2014-02-11 DIAGNOSIS — E669 Obesity, unspecified: Secondary | ICD-10-CM

## 2014-02-11 LAB — I-STAT CHEM 8, ED
BUN: 21 mg/dL (ref 6–23)
CALCIUM ION: 1.14 mmol/L (ref 1.13–1.30)
CREATININE: 1.8 mg/dL — AB (ref 0.50–1.35)
Chloride: 107 mEq/L (ref 96–112)
GLUCOSE: 169 mg/dL — AB (ref 70–99)
HCT: 20 % — ABNORMAL LOW (ref 39.0–52.0)
HEMOGLOBIN: 6.8 g/dL — AB (ref 13.0–17.0)
Potassium: 3.9 mEq/L (ref 3.7–5.3)
Sodium: 140 mEq/L (ref 137–147)
TCO2: 24 mmol/L (ref 0–100)

## 2014-02-11 LAB — CBC
HCT: 20.8 % — ABNORMAL LOW (ref 39.0–52.0)
HEMOGLOBIN: 6.4 g/dL — AB (ref 13.0–17.0)
MCH: 24.4 pg — ABNORMAL LOW (ref 26.0–34.0)
MCHC: 30.8 g/dL (ref 30.0–36.0)
MCV: 79.4 fL (ref 78.0–100.0)
Platelets: 216 10*3/uL (ref 150–400)
RBC: 2.62 MIL/uL — ABNORMAL LOW (ref 4.22–5.81)
RDW: 15.9 % — ABNORMAL HIGH (ref 11.5–15.5)
WBC: 5.4 10*3/uL (ref 4.0–10.5)

## 2014-02-11 LAB — POC OCCULT BLOOD, ED: Fecal Occult Bld: POSITIVE — AB

## 2014-02-11 LAB — PRO B NATRIURETIC PEPTIDE: Pro B Natriuretic peptide (BNP): 2409 pg/mL — ABNORMAL HIGH (ref 0–450)

## 2014-02-11 LAB — I-STAT TROPONIN, ED: TROPONIN I, POC: 0.03 ng/mL (ref 0.00–0.08)

## 2014-02-11 LAB — PREPARE RBC (CROSSMATCH)

## 2014-02-11 MED ORDER — SODIUM CHLORIDE 0.9 % IV SOLN
INTRAVENOUS | Status: DC
  Administered 2014-02-11: via INTRAVENOUS

## 2014-02-11 MED ORDER — FUROSEMIDE 10 MG/ML IJ SOLN
60.0000 mg | Freq: Once | INTRAMUSCULAR | Status: AC
  Administered 2014-02-11: 60 mg via INTRAVENOUS
  Filled 2014-02-11: qty 6

## 2014-02-11 NOTE — ED Notes (Signed)
Pt brought to ER from [place].  States he feels like he was going to pass out for the last 4 days. States he has not slept in 4 days due to not having a proper place to live.  He brought meds that have not been filled since April 2015.  When [facility] picked him up his BP was 210/110 HR 88 and resp 18.  Pt has bilateral edema in legs.  HX of diabetes 145.

## 2014-02-11 NOTE — ED Provider Notes (Signed)
CSN: [id number]     Arrival date & time 02/11/14  2225 History   First MD Initiated Contact with Patient 02/11/14 2306     Chief Complaint  Patient presents with  . Near Syncope     (Consider location/radiation/quality/duration/timing/severity/associated sxs/prior Treatment) HPI Patient 80 year old male with history of congestive heart failure, coronary artery disease, atrial fibrillation, diabetes, hypertension and bilateral lymphedema presents with 3-4 days of increased dyspnea with exertion, blood in stool, increased bilateral lower extremity swelling or and lightheadedness. Patient denied feeling like he is going to pass out or having any syncopal episodes. He denies any chest pain or abdominal pain. He's had no nausea or vomiting. He denies fever or chills. Patient states that roughly 3 years ago he had just intestinal bleeding requiring colonoscopy. He has not seen a physician in several months and has not taken any of his medications since April. Past Medical History  Diagnosis Date  . Smoker   . Narcolepsy   . Hypertension   . BPH (benign prostatic hyperplasia)   . Arthritis   . Diabetes mellitus   . Neuropathy in diabetes   . Herpes   . Dyslipidemia   . Obesity   . Lymphedema     LE lymphedemia with hx of cellulitis.  . CKD (chronic kidney disease)   . Anemia   . History of GI bleed 06/2012    EGD unremarkable;  Colo with large cecal polyp (bx benign)  . Ischemic cardiomyopathy     Echo 06/19/12: EF 40-45%, mid to distal anteroseptal HK, moderate LAE, PASP 37  . Chronic systolic heart failure   . CAD (coronary artery disease)     a. probable underlying CAD;  b. Type 2 NSTEMi in setting of profound anemia (Hgb 3.6) from GI bleed in 06/2012 and WMA on echo;  c. med Rx due to CKD and GI bleed  . CHF (congestive heart failure)   . Atrial fibrillation    Past Surgical History  Procedure Laterality Date  . Leg surgery      Left leg surgery. broken leg  .  Esophagogastroduodenoscopy  06/19/2012    Procedure: ESOPHAGOGASTRODUODENOSCOPY (EGD);  Surgeon: Beryle Beams, MD;  Location: [facility] ENDOSCOPY;  Service: Endoscopy;  Laterality: N/A;  . Colonoscopy  06/21/2012    Procedure: COLONOSCOPY;  Surgeon: Juanita Craver, MD;  Location: [facility] ENDOSCOPY;  Service: Endoscopy;  Laterality: N/A;   History reviewed. No pertinent family history. History  Substance Use Topics  . Smoking status: Former Smoker    Quit date: 08/26/1998  . Smokeless tobacco: Never Used  . Alcohol Use: No    Review of Systems  Constitutional: Negative for fever and chills.  Respiratory: Positive for shortness of breath. Negative for cough.   Cardiovascular: Positive for leg swelling. Negative for chest pain and palpitations.  Gastrointestinal: Positive for blood in stool. Negative for nausea, vomiting, abdominal pain, diarrhea and constipation.  Genitourinary: Negative for dysuria.  Musculoskeletal: Negative for back pain, neck pain and neck stiffness.  Skin: Negative for rash and wound.  Neurological: Positive for dizziness, weakness (generalized) and light-headedness. Negative for syncope, numbness and headaches.  All other systems reviewed and are negative.     Allergies  Review of patient's allergies indicates no known allergies.  Home Medications   Prior to Admission medications   Medication Sig Start Date End Date Taking? Authorizing Provider  carvedilol (COREG) 3.125 MG tablet Take 1 tablet (3.125 mg total) by mouth 2 (two) times daily with a meal. 12/08/13  Lauren Burnetta Sabin, PA-C  furosemide (LASIX) 40 MG tablet Take 1 tablet (40 mg total) by mouth daily. 01/10/14   Denita Lung, MD  glipiZIDE (GLUCOTROL) 2.5 mg TABS tablet Take 0.5 tablets (2.5 mg total) by mouth daily before breakfast. 01/10/14   Denita Lung, MD  lisinopril (PRINIVIL,ZESTRIL) 5 MG tablet Take 2 tablets (10 mg total) by mouth daily. 01/10/14   Denita Lung, MD   BP 160/66  Pulse 96  Temp(Src)  98 F (36.7 C) (Oral)  Resp 18  Ht 5\' 11"  (1.803 m)  Wt 280 lb (127.007 kg)  BMI 39.07 kg/m2  SpO2 99% Physical Exam  Nursing note and vitals reviewed. Constitutional: He is oriented to person, place, and time. He appears well-developed and well-nourished. No distress.  HENT:  Head: Normocephalic and atraumatic.  Mouth/Throat: Oropharynx is clear and moist. No oropharyngeal exudate.  Eyes: EOM are normal. Pupils are equal, round, and reactive to light.  Neck: Normal range of motion. Neck supple.  Cardiovascular: Normal rate and regular rhythm.   Pulmonary/Chest: Effort normal and breath sounds normal. No respiratory distress. He has no wheezes. He has no rales. He exhibits no tenderness.  Abdominal: Soft. Bowel sounds are normal. He exhibits no distension and no mass. There is no tenderness. There is no rebound and no guarding.  Genitourinary: Guaiac positive stool.  Stool is brown but guaiac-positive  Musculoskeletal: Normal range of motion. He exhibits edema. He exhibits no tenderness.  3+ bilateral lower sugary pitting edema to the level of knees. Distal pulses intact  Neurological: He is alert and oriented to person, place, and time.  5/5 motor in all extremities. Sensation is grossly intact.  Skin: Skin is warm and dry. No rash noted. No erythema.  Psychiatric: He has a normal mood and affect. His behavior is normal.    ED Course  Procedures (including critical care time) Labs Review Labs Reviewed  CBC - Abnormal; Notable for the following:    RBC 2.62 (*)    Hemoglobin 6.4 (*)    HCT 20.8 (*)    MCH 24.4 (*)    RDW 15.9 (*)    All other components within normal limits  I-STAT CHEM 8, ED - Abnormal; Notable for the following:    Creatinine, Ser 1.80 (*)    Glucose, Bld 169 (*)    Hemoglobin 6.8 (*)    HCT 20.0 (*)    All other components within normal limits  PRO B NATRIURETIC PEPTIDE  COMPREHENSIVE METABOLIC PANEL  PROTIME-INR  APTT  I-STAT TROPOININ, ED  POC  OCCULT BLOOD, ED  TYPE AND SCREEN  PREPARE RBC (CROSSMATCH)    Imaging Review No results found.   EKG Interpretation None      MDM   Final diagnoses:  None   Discussed with [facility]. Will see patient in emergency department and admit. Patient given Lasix and will transfuse slowly.    Julianne Rice, MD 02/12/14 0030

## 2014-02-12 ENCOUNTER — Encounter (HOSPITAL_COMMUNITY): Payer: Self-pay | Admitting: Internal Medicine

## 2014-02-12 DIAGNOSIS — N183 Chronic kidney disease, stage 3 unspecified: Secondary | ICD-10-CM

## 2014-02-12 DIAGNOSIS — I890 Lymphedema, not elsewhere classified: Secondary | ICD-10-CM

## 2014-02-12 DIAGNOSIS — E1129 Type 2 diabetes mellitus with other diabetic kidney complication: Secondary | ICD-10-CM

## 2014-02-12 DIAGNOSIS — I509 Heart failure, unspecified: Secondary | ICD-10-CM

## 2014-02-12 DIAGNOSIS — D649 Anemia, unspecified: Secondary | ICD-10-CM

## 2014-02-12 DIAGNOSIS — R609 Edema, unspecified: Secondary | ICD-10-CM

## 2014-02-12 DIAGNOSIS — K922 Gastrointestinal hemorrhage, unspecified: Secondary | ICD-10-CM

## 2014-02-12 LAB — CBC
HEMATOCRIT: 26.4 % — AB (ref 39.0–52.0)
HEMOGLOBIN: 8.4 g/dL — AB (ref 13.0–17.0)
MCH: 25.2 pg — ABNORMAL LOW (ref 26.0–34.0)
MCHC: 31.8 g/dL (ref 30.0–36.0)
MCV: 79.3 fL (ref 78.0–100.0)
Platelets: 220 10*3/uL (ref 150–400)
RBC: 3.33 MIL/uL — ABNORMAL LOW (ref 4.22–5.81)
RDW: 15.2 % (ref 11.5–15.5)
WBC: 5.5 10*3/uL (ref 4.0–10.5)

## 2014-02-12 LAB — TSH: TSH: 1.55 u[IU]/mL (ref 0.350–4.500)

## 2014-02-12 LAB — COMPREHENSIVE METABOLIC PANEL
ALT: <5 U/L (ref 0–53)
AST: 8 U/L (ref 0–37)
Albumin: 3 g/dL — ABNORMAL LOW (ref 3.5–5.2)
Alkaline Phosphatase: 57 U/L (ref 39–117)
BUN: 22 mg/dL (ref 6–23)
CO2: 22 mEq/L (ref 19–32)
CREATININE: 1.76 mg/dL — AB (ref 0.50–1.35)
Calcium: 8.6 mg/dL (ref 8.4–10.5)
Chloride: 104 mEq/L (ref 96–112)
GFR calc non Af Amer: 35 mL/min — ABNORMAL LOW (ref >90)
GFR, EST AFRICAN AMERICAN: 41 mL/min — AB (ref >90)
Glucose, Bld: 171 mg/dL — ABNORMAL HIGH (ref 70–99)
POTASSIUM: 4.2 meq/L (ref 3.7–5.3)
Sodium: 142 mEq/L (ref 137–147)
TOTAL PROTEIN: 6.5 g/dL (ref 6.0–8.3)
Total Bilirubin: <0.2 mg/dL — ABNORMAL LOW (ref 0.3–1.2)

## 2014-02-12 LAB — GLUCOSE, CAPILLARY
GLUCOSE-CAPILLARY: 152 mg/dL — AB (ref 70–99)
GLUCOSE-CAPILLARY: 142 mg/dL — AB (ref 70–99)
GLUCOSE-CAPILLARY: 186 mg/dL — AB (ref 70–99)
Glucose-Capillary: 108 mg/dL — ABNORMAL HIGH (ref 70–99)
Glucose-Capillary: 97 mg/dL (ref 70–99)

## 2014-02-12 LAB — MRSA PCR SCREENING: MRSA by PCR: NEGATIVE

## 2014-02-12 LAB — FERRITIN: Ferritin: 5 ng/mL — ABNORMAL LOW (ref 22–322)

## 2014-02-12 LAB — PROTIME-INR
INR: 1.06 (ref 0.00–1.49)
Prothrombin Time: 13.6 seconds (ref 11.6–15.2)

## 2014-02-12 LAB — PREPARE RBC (CROSSMATCH)

## 2014-02-12 LAB — VITAMIN B12: Vitamin B-12: 389 pg/mL (ref 211–911)

## 2014-02-12 LAB — IRON AND TIBC
IRON: 77 ug/dL (ref 42–135)
SATURATION RATIOS: 27 % (ref 20–55)
TIBC: 281 ug/dL (ref 215–435)
UIBC: 204 ug/dL (ref 125–400)

## 2014-02-12 LAB — RETICULOCYTES
RBC.: 3.25 MIL/uL — ABNORMAL LOW (ref 4.22–5.81)
Retic Count, Absolute: 55.3 10*3/uL (ref 19.0–186.0)
Retic Ct Pct: 1.7 % (ref 0.4–3.1)

## 2014-02-12 LAB — MAGNESIUM: Magnesium: 1.6 mg/dL (ref 1.5–2.5)

## 2014-02-12 LAB — APTT: APTT: 32 s (ref 24–37)

## 2014-02-12 MED ORDER — SODIUM CHLORIDE 0.9 % IJ SOLN: 3.0000 mL | INTRAMUSCULAR | Status: DC | PRN

## 2014-02-12 MED ORDER — SODIUM CHLORIDE 0.9 % IV SOLN
500.0000 mL | Freq: Once | INTRAVENOUS | Status: AC
  Administered 2014-02-12: 500 mL via INTRAVENOUS

## 2014-02-12 MED ORDER — SODIUM CHLORIDE 0.9 % IV SOLN
250.0000 mL | INTRAVENOUS | Status: DC | PRN
  Administered 2014-02-15: 250 mL via INTRAVENOUS

## 2014-02-12 MED ORDER — PANTOPRAZOLE SODIUM 40 MG PO TBEC
40.0000 mg | DELAYED_RELEASE_TABLET | Freq: Two times a day (BID) | ORAL | Status: DC
  Administered 2014-02-13 (×2): 40 mg via ORAL
  Filled 2014-02-12 (×2): qty 1

## 2014-02-12 MED ORDER — FUROSEMIDE 40 MG PO TABS
40.0000 mg | ORAL_TABLET | Freq: Every day | ORAL | Status: DC
  Filled 2014-02-12: qty 1

## 2014-02-12 MED ORDER — DIPHENHYDRAMINE HCL 25 MG PO CAPS
25.0000 mg | ORAL_CAPSULE | Freq: Once | ORAL | Status: AC
  Administered 2014-02-12: 25 mg via ORAL
  Filled 2014-02-12: qty 1

## 2014-02-12 MED ORDER — SODIUM CHLORIDE 0.9 % IV SOLN: INTRAVENOUS | Status: DC

## 2014-02-12 MED ORDER — INSULIN ASPART 100 UNIT/ML ~~LOC~~ SOLN
0.0000 [IU] | SUBCUTANEOUS | Status: DC
  Administered 2014-02-12 – 2014-02-13 (×4): 2 [IU] via SUBCUTANEOUS
  Administered 2014-02-13 (×2): 1 [IU] via SUBCUTANEOUS

## 2014-02-12 MED ORDER — ACETAMINOPHEN 325 MG PO TABS
650.0000 mg | ORAL_TABLET | Freq: Once | ORAL | Status: AC
  Administered 2014-02-12: 650 mg via ORAL
  Filled 2014-02-12: qty 2

## 2014-02-12 MED ORDER — CARVEDILOL 6.25 MG PO TABS
6.2500 mg | ORAL_TABLET | Freq: Two times a day (BID) | ORAL | Status: DC
  Administered 2014-02-12 – 2014-02-14 (×4): 6.25 mg via ORAL
  Filled 2014-02-12 (×6): qty 1

## 2014-02-12 MED ORDER — CARVEDILOL 3.125 MG PO TABS
3.1250 mg | ORAL_TABLET | Freq: Two times a day (BID) | ORAL | Status: DC
  Administered 2014-02-12: 3.125 mg via ORAL
  Filled 2014-02-12 (×3): qty 1

## 2014-02-12 MED ORDER — INSULIN ASPART 100 UNIT/ML ~~LOC~~ SOLN: 0.0000 [IU] | SUBCUTANEOUS | Status: DC

## 2014-02-12 MED ORDER — SODIUM CHLORIDE 0.9 % IJ SOLN
3.0000 mL | Freq: Two times a day (BID) | INTRAMUSCULAR | Status: DC
  Administered 2014-02-12 – 2014-02-14 (×4): 3 mL via INTRAVENOUS

## 2014-02-12 MED ORDER — FUROSEMIDE 10 MG/ML IJ SOLN
20.0000 mg | Freq: Once | INTRAMUSCULAR | Status: AC
Start: 2014-02-12 — End: 2014-02-12
  Administered 2014-02-12: 20 mg via INTRAVENOUS
  Filled 2014-02-12: qty 2

## 2014-02-12 MED ORDER — HYDROCHLOROTHIAZIDE 25 MG PO TABS
25.0000 mg | ORAL_TABLET | Freq: Every day | ORAL | Status: DC
  Administered 2014-02-12 – 2014-02-14 (×2): 25 mg via ORAL
  Filled 2014-02-12 (×3): qty 1

## 2014-02-12 MED ORDER — PEG 3350-KCL-NA BICARB-NACL 420 G PO SOLR
4000.0000 mL | Freq: Once | ORAL | Status: AC
  Administered 2014-02-12: 4000 mL via ORAL
  Filled 2014-02-12: qty 4000

## 2014-02-12 MED ORDER — LISINOPRIL 20 MG PO TABS
20.0000 mg | ORAL_TABLET | Freq: Every day | ORAL | Status: DC
  Administered 2014-02-12 – 2014-02-14 (×2): 20 mg via ORAL
  Filled 2014-02-12 (×3): qty 1

## 2014-02-12 MED ORDER — LISINOPRIL 10 MG PO TABS
10.0000 mg | ORAL_TABLET | Freq: Every day | ORAL | Status: DC
  Filled 2014-02-12: qty 1

## 2014-02-12 NOTE — Progress Notes (Signed)
VASCULAR LAB PRELIMINARY  PRELIMINARY  PRELIMINARY  PRELIMINARY  Left lower extremity venous Doppler completed.    Preliminary report:  There is no DVT or SVT noted in the left lower extremity.   KANADY, CANDACE, RVT 02/12/2014, 10:01 AM

## 2014-02-12 NOTE — Progress Notes (Signed)
Pt requested TED hose be removed and not put back on. Educated pt on MD order for hose, but he refuses to wear them. SCDs still on.

## 2014-02-12 NOTE — ED Notes (Signed)
MD at bedside. 

## 2014-02-12 NOTE — H&P (Signed)
PCP:   Wyatt Haste, MD    Chief Complaint:  Sunday Corn headed  HPI: [name] is a 80 y.o. male   has a past medical history of Smoker; Narcolepsy; Hypertension; BPH (benign prostatic hyperplasia); Arthritis; Diabetes mellitus; Neuropathy in diabetes; Herpes; Dyslipidemia; Obesity; Lymphedema; CKD (chronic kidney disease); Anemia; History of GI bleed (06/2012); Ischemic cardiomyopathy; Chronic systolic heart failure; CAD (coronary artery disease); CHF (congestive heart failure); and Atrial fibrillation.   Presented with  Patient has run out of his medication and was not able to get refills. He stop taken all of his meds 3 months ago.  Patient states he only took medicine to please his wife that was terminally ill. Patient feels that his daughter and his doctor prevents him from spending his money. He reports not being able to rest because he is sleeping on the couch. His legs have been swelling for some time. He reports some chest pain and shortness of breath over past few days. He was found to have Hg 6.4  Patient reports having blood in his stool for the past 3-4 day.   In ER he was found to have evidence of CHF on CXR and peripheral edema. HE reports not taking any NSAIDS Patietn was hemoccult positive.    Hospitalist was called for admission for light-headedness  Review of Systems:    Pertinent positives include: light headedness  Constitutional:  No weight loss, night sweats, Fevers, chills, fatigue, weight loss  HEENT:  No headaches, Difficulty swallowing,Tooth/dental problems,Sore throat,  No sneezing, itching, ear ache, nasal congestion, post nasal drip,  Cardio-vascular:  No chest pain, Orthopnea, PND, anasarca, dizziness, palpitations.no Bilateral lower extremity swelling  GI:  No heartburn, indigestion, abdominal pain, nausea, vomiting, diarrhea, change in bowel habits, loss of appetite, melena, blood in stool, hematemesis Resp:  no shortness of breath at  rest. No dyspnea on exertion, No excess mucus, no productive cough, No non-productive cough, No coughing up of blood.No change in color of mucus.No wheezing. Skin:  no rash or lesions. No jaundice GU:  no dysuria, change in color of urine, no urgency or frequency. No straining to urinate.  No flank pain.  Musculoskeletal:  No joint pain or no joint swelling. No decreased range of motion. No back pain.  Psych:  No change in mood or affect. No depression or anxiety. No memory loss.  Neuro: no localizing neurological complaints, no tingling, no weakness, no double vision, no gait abnormality, no slurred speech, no confusion  Otherwise ROS are negative except for above, 10 systems were reviewed  Past Medical History: Past Medical History  Diagnosis Date  . Smoker   . Narcolepsy   . Hypertension   . BPH (benign prostatic hyperplasia)   . Arthritis   . Diabetes mellitus   . Neuropathy in diabetes   . Herpes   . Dyslipidemia   . Obesity   . Lymphedema     LE lymphedemia with hx of cellulitis.  . CKD (chronic kidney disease)   . Anemia   . History of GI bleed 06/2012    EGD unremarkable;  Colo with large cecal polyp (bx benign)  . Ischemic cardiomyopathy     Echo 06/19/12: EF 40-45%, mid to distal anteroseptal HK, moderate LAE, PASP 37  . Chronic systolic heart failure   . CAD (coronary artery disease)     a. probable underlying CAD;  b. Type 2 NSTEMi in setting of profound anemia (Hgb 3.6) from GI bleed in 06/2012 and WMA on  echo;  c. med Rx due to CKD and GI bleed  . CHF (congestive heart failure)   . Atrial fibrillation    Past Surgical History  Procedure Laterality Date  . Leg surgery      Left leg surgery. broken leg  . Esophagogastroduodenoscopy  06/19/2012    Procedure: ESOPHAGOGASTRODUODENOSCOPY (EGD);  Surgeon: Beryle Beams, MD;  Location: [facility] ENDOSCOPY;  Service: Endoscopy;  Laterality: N/A;  . Colonoscopy  06/21/2012    Procedure: COLONOSCOPY;  Surgeon: Juanita Craver, MD;  Location: [facility] ENDOSCOPY;  Service: Endoscopy;  Laterality: N/A;     Medications: Prior to Admission medications   Medication Sig Start Date End Date Taking? Authorizing Provider  carvedilol (COREG) 3.125 MG tablet Take 1 tablet (3.125 mg total) by mouth 2 (two) times daily with a meal. 12/08/13   Lauren Burnetta Sabin, PA-C  furosemide (LASIX) 40 MG tablet Take 1 tablet (40 mg total) by mouth daily. 01/10/14   Denita Lung, MD  glipiZIDE (GLUCOTROL) 2.5 mg TABS tablet Take 0.5 tablets (2.5 mg total) by mouth daily before breakfast. 01/10/14   Denita Lung, MD  lisinopril (PRINIVIL,ZESTRIL) 5 MG tablet Take 2 tablets (10 mg total) by mouth daily. 01/10/14   Denita Lung, MD    Allergies:  No Known Allergies  Social History:  Ambulatory   independently  Or walker  Lives at home  With family     reports that he quit smoking about 15 years ago. He has never used smokeless tobacco. He reports that he does not drink alcohol or use illicit drugs.    Family History: family history includes Brain cancer in his mother; Diabetes type II in his sister; Narcolepsy in his paternal uncle.    Physical Exam: Patient Vitals for the past 24 hrs:  BP Temp Temp src Pulse Resp SpO2 Height Weight  02/12/14 0130 184/138 mmHg - - 81 14 100 % - -  02/12/14 0115 175/73 mmHg 98 F (36.7 C) Oral 77 12 100 % - -  02/12/14 0100 171/88 mmHg - - 79 18 99 % - -  02/12/14 0045 178/77 mmHg - - 80 12 100 % - -  02/12/14 0030 177/72 mmHg 98.2 F (36.8 C) Oral 79 17 100 % - -  02/12/14 0015 168/75 mmHg - - 83 21 100 % - -  02/12/14 0000 165/69 mmHg - - 81 21 100 % - -  02/11/14 2342 151/73 mmHg - - 92 24 99 % - -  02/11/14 2330 151/73 mmHg - - 90 14 100 % - -  02/11/14 2315 165/78 mmHg - - 92 15 98 % - -  02/11/14 2300 186/87 mmHg - - 95 22 99 % - -  02/11/14 2245 160/66 mmHg - - 96 18 99 % - -  02/11/14 2239 162/73 mmHg 98 F (36.7 C) Oral 91 21 99 % - -  02/11/14 2238 210/110 mmHg 98 F (36.7 C)  Oral 88 - 100 % 5\' 11"  (1.803 m) 127.007 kg (280 lb)    1. General:  in No Acute distress 2. Psychological: Alert and   Oriented 3. Head/ENT:   Moist   Mucous Membranes, pale                          Head Non traumatic, neck supple  Poor Dentition 4. SKIN: normal  Skin turgor,  Skin clean Dry and intact no rash 5. Heart: Regular rate and rhythm no Murmur, Rub or gallop 6. Lungs:  no wheezes occasional crackles   7. Abdomen: Soft, non-tender, Non distended 8. Lower extremities: no clubbing, cyanosis, pitting edema edema 9. Neurologically Grossly intact, moving all 4 extremities equally 10. MSK: Normal range of motion  body mass index is 39.07 kg/(m^2).   Labs on Admission:   Recent Labs  02/11/14 2256 02/11/14 2306  NA 142 140  K 4.2 3.9  CL 104 107  CO2 22  --   GLUCOSE 171* 169*  BUN 22 21  CREATININE 1.76* 1.80*  CALCIUM 8.6  --     Recent Labs  02/11/14 2256  AST 8  ALT <5  ALKPHOS 57  BILITOT <0.2*  PROT 6.5  ALBUMIN 3.0*   No results found for this basename: LIPASE, AMYLASE,  in the last 72 hours  Recent Labs  02/11/14 2256 02/11/14 2306  WBC 5.4  --   HGB 6.4* 6.8*  HCT 20.8* 20.0*  MCV 79.4  --   PLT 216  --    No results found for this basename: CKTOTAL, CKMB, CKMBINDEX, TROPONINI,  in the last 72 hours No results found for this basename: TSH, T4TOTAL, FREET3, T3FREE, THYROIDAB,  in the last 72 hours No results found for this basename: VITAMINB12, FOLATE, FERRITIN, TIBC, IRON, RETICCTPCT,  in the last 72 hours Lab Results  Component Value Date   HGBA1C 7.9* 11/13/2013    Estimated Creatinine Clearance: 45.9 ml/min (by C-G formula based on Cr of 1.8). ABG    Component Value Date/Time   TCO2 24 02/11/2014 2306     No results found for this basename: DDIMER     Other results:  I have pearsonaly reviewed this: ECG REPORT  Rate: 89  Rhythm: SR ST&T Change: ST depression in V6  BNP (last 3 results)  Recent  Labs  11/12/13 1455 12/08/13 1502 02/11/14 2256  PROBNP 2435.0* 1417.0* 2409.0*    Filed Weights   02/11/14 2238  Weight: 127.007 kg (280 lb)     Cultures:    Component Value Date/Time   SDES BLOOD RIGHT HAND 06/18/2012 2136   SDES BLOOD LEFT HAND 06/18/2012 2136   SPECREQUEST BOTTLES DRAWN AEROBIC ONLY 8CC 06/18/2012 2136   SPECREQUEST BOTTLES DRAWN AEROBIC ONLY 4CC 06/18/2012 2136   CULT NO GROWTH 5 DAYS 06/18/2012 2136   CULT NO GROWTH 5 DAYS 06/18/2012 2136   REPTSTATUS 06/25/2012 FINAL 06/18/2012 2136   REPTSTATUS 06/25/2012 FINAL 06/18/2012 2136    Radiological Exams on Admission: Dg Chest Portable 1 View  02/11/2014   CLINICAL DATA:  NEAR SYNCOPE  EXAM: PORTABLE CHEST - 1 VIEW  COMPARISON:  Prior radiograph from 12/08/2013  FINDINGS: Prominent cardiomegaly is stable as compared to prior exam. Mediastinal silhouette within normal limits. Few scattered atherosclerotic calcifications present within the aortic arch.  The lungs are normally inflated. There is mild central perihilar pulmonary vascular congestion without overt pulmonary edema. No definite pleural effusion. No focal infiltrate identified. There is no pneumothorax.  No acute osseous abnormality identified.  IMPRESSION: Stable cardiomegaly with mild central perihilar vascular congestion without overt pulmonary edema.   Electronically Signed   By: Jeannine Boga M.D.   On: 02/11/2014 23:53    Chart has been reviewed  Assessment/Plan  80 yo M with hx of CHF with preserved EF per echo 3 months ago, DM and poorly controlled HTN  due to noncompliance presents with CHF exacerbation and likely lower Gi bleed with blood loss anemia   Present on Admission:  . Anemia - transfuse 2 units of pRBC and follow CBC . Acute exacerbation of CHF (congestive heart failure) -in the setting of noncompliance will restart home dose of lasix . CKD (chronic kidney disease), stage III - stable cont to monitor . Diabetes mellitus  -SSI hold glipizide while NPO . Hypertension - restart home meds . Hypertensive urgency - patietn is noncompliant and reluctant to change his behavior. Care is complicated by lack on insight  . Acute GI bleeding - Gi consult in AM not sure when was the last epiode of blood per rectum per ER brown hem positive stool in rectum now. Serial CBC after transfusion, keep NPO . Atrial fibrillation not on long term anticoagulation due to noncompliance and hx of GI bleed  Prophylaxis: SCD  Protonix  CODE STATUS:  FULL CODE  Other plan as per orders.  I have spent a total of 65 min on this admission  DOUTOVA,ANASTASSIA 02/12/2014, 1:43 AM  [facility]  Pager [phone]   If 7AM-7PM, please contact the day team taking care of the patient  [email or web address]  Password [email or web address]

## 2014-02-12 NOTE — Consult Note (Addendum)
Unassigned patient Reason for Consult: Rectal bleeding and anemia. Referring Physician: THP  [name] is an 80 y.o. male.  HPI: 80 year old black male, non-compliant with his medical care, multiple medical problems listed below, gives a 4 day history of rectal bleeding. He had a colonoscopy in 2013 when multiple polyps were removed an a villous polyp was found in the cecum. Patient denies  Having any abdominal pain, nausea or vomiting. He has felt lightheaded but denies having any syncope. He received 2 units of blood on admission. Reportedly he has not taken any of his medications in the last 3 months.  Past Medical History  Diagnosis Date  . Smoker   . Narcolepsy   . Hypertension   . BPH (benign prostatic hyperplasia)   . Arthritis   . Diabetes mellitus   . Neuropathy in diabetes   . Herpes   . Dyslipidemia   . Obesity   . Lymphedema     LE lymphedemia with hx of cellulitis.  . CKD (chronic kidney disease)   . Anemia   . History of GI bleed 06/2012    EGD unremarkable;  Colo with large cecal polyp (bx benign)  . Ischemic cardiomyopathy     Echo 06/19/12: EF 40-45%, mid to distal anteroseptal HK, moderate LAE, PASP 37  . Chronic systolic heart failure   . CAD (coronary artery disease)     a. probable underlying CAD;  b. Type 2 NSTEMi in setting of profound anemia (Hgb 3.6) from GI bleed in 06/2012 and WMA on echo;  c. med Rx due to CKD and GI bleed  . CHF (congestive heart failure)   . Atrial fibrillation    Past Surgical History  Procedure Laterality Date  . Leg surgery      Left leg surgery. broken leg  . Esophagogastroduodenoscopy  06/19/2012    Procedure: ESOPHAGOGASTRODUODENOSCOPY (EGD);  Surgeon: Beryle Beams, MD;  Location: [facility] ENDOSCOPY;  Service: Endoscopy;  Laterality: N/A;  . Colonoscopy  06/21/2012    Procedure: COLONOSCOPY;  Surgeon: Juanita Craver, MD;  Location: [facility] ENDOSCOPY;  Service: Endoscopy;  Laterality: N/A;   Family History  Problem Relation  Age of Onset  . Brain cancer Mother   . Diabetes type II Sister   . Narcolepsy Paternal Uncle    Social History:  reports that he quit smoking about 15 years ago. He has never used smokeless tobacco. He reports that he does not drink alcohol or use illicit drugs.  Allergies: No Known Allergies  Medications: I have reviewed the patient's current medications.  Results for orders placed during the hospital encounter of 02/11/14 (from the past 48 hour(s))  PRO B NATRIURETIC PEPTIDE     Status: Abnormal   Collection Time    02/11/14 10:56 PM      Result Value Ref Range   Pro B Natriuretic peptide (BNP) 2409.0 (*) 0 - 450 pg/mL  CBC     Status: Abnormal   Collection Time    02/11/14 10:56 PM      Result Value Ref Range   WBC 5.4  4.0 - 10.5 K/uL   RBC 2.62 (*) 4.22 - 5.81 MIL/uL   Hemoglobin 6.4 (*) 13.0 - 17.0 g/dL   Comment: CRITICAL RESULT CALLED TO, READ BACK BY AND VERIFIED WITH:     WHITE,E RN 02/11/2014 2324 JORDANS     REPEATED TO VERIFY   HCT 20.8 (*) 39.0 - 52.0 %   MCV 79.4  78.0 - 100.0 fL  MCH 24.4 (*) 26.0 - 34.0 pg   MCHC 30.8  30.0 - 36.0 g/dL   RDW 15.9 (*) 11.5 - 15.5 %   Platelets 216  150 - 400 K/uL  COMPREHENSIVE METABOLIC PANEL     Status: Abnormal   Collection Time    02/11/14 10:56 PM      Result Value Ref Range   Sodium 142  137 - 147 mEq/L   Potassium 4.2  3.7 - 5.3 mEq/L   Chloride 104  96 - 112 mEq/L   CO2 22  19 - 32 mEq/L   Glucose, Bld 171 (*) 70 - 99 mg/dL   BUN 22  6 - 23 mg/dL   Creatinine, Ser 1.76 (*) 0.50 - 1.35 mg/dL   Calcium 8.6  8.4 - 10.5 mg/dL   Total Protein 6.5  6.0 - 8.3 g/dL   Albumin 3.0 (*) 3.5 - 5.2 g/dL   AST 8  0 - 37 U/L   ALT <5  0 - 53 U/L   Alkaline Phosphatase 57  39 - 117 U/L   Total Bilirubin <0.2 (*) 0.3 - 1.2 mg/dL   GFR calc non Af Amer 35 (*) >90 mL/min   GFR calc Af Amer 41 (*) >90 mL/min   Comment: (NOTE)     The eGFR has been calculated using the CKD EPI equation.     This calculation has not been  validated in all clinical situations.     eGFR's persistently <90 mL/min signify possible Chronic Kidney     Disease.  PROTIME-INR     Status: None   Collection Time    02/11/14 10:56 PM      Result Value Ref Range   Prothrombin Time 13.6  11.6 - 15.2 seconds   INR 1.06  0.00 - 1.49  APTT     Status: None   Collection Time    02/11/14 10:56 PM      Result Value Ref Range   aPTT 32  24 - 37 seconds  I-STAT TROPOININ, ED     Status: None   Collection Time    02/11/14 11:04 PM      Result Value Ref Range   Troponin i, poc 0.03  0.00 - 0.08 ng/mL   Comment 3            Comment: Due to the release kinetics of cTnI,     a negative result within the first hours     of the onset of symptoms does not rule out     myocardial infarction with certainty.     If myocardial infarction is still suspected,     repeat the test at appropriate intervals.  I-STAT CHEM 8, ED     Status: Abnormal   Collection Time    02/11/14 11:06 PM      Result Value Ref Range   Sodium 140  137 - 147 mEq/L   Potassium 3.9  3.7 - 5.3 mEq/L   Chloride 107  96 - 112 mEq/L   BUN 21  6 - 23 mg/dL   Creatinine, Ser 1.80 (*) 0.50 - 1.35 mg/dL   Glucose, Bld 169 (*) 70 - 99 mg/dL   Calcium, Ion 1.14  1.13 - 1.30 mmol/L   TCO2 24  0 - 100 mmol/L   Hemoglobin 6.8 (*) 13.0 - 17.0 g/dL   HCT 20.0 (*) 39.0 - 52.0 %   Comment NOTIFIED PHYSICIAN    TYPE AND SCREEN     Status:  None   Collection Time    02/11/14 11:45 PM      Result Value Ref Range   ABO/RH(D) O POS     Antibody Screen NEG     Sample Expiration 02/14/2014     Unit Number [id number]     Blood Component Type RED CELLS,LR     Unit division 00     Status of Unit ISSUED     Transfusion Status OK TO TRANSFUSE     Crossmatch Result Compatible     Unit Number [id number]     Blood Component Type RBC LR PHER1     Unit division 00     Status of Unit ISSUED     Transfusion Status OK TO TRANSFUSE     Crossmatch Result Compatible    PREPARE RBC  (CROSSMATCH)     Status: None   Collection Time    02/11/14 11:45 PM      Result Value Ref Range   Order Confirmation ORDER PROCESSED BY BLOOD BANK    POC OCCULT BLOOD, ED     Status: Abnormal   Collection Time    02/11/14 11:57 PM      Result Value Ref Range   Fecal Occult Bld POSITIVE (*) NEGATIVE  MRSA PCR SCREENING     Status: None   Collection Time    02/12/14  3:07 AM      Result Value Ref Range   MRSA by PCR NEGATIVE  NEGATIVE   Comment:            The GeneXpert MRSA Assay (FDA     approved for NASAL specimens     only), is one component of a     comprehensive MRSA colonization     surveillance program. It is not     intended to diagnose MRSA     infection nor to guide or     monitor treatment for     MRSA infections.  PREPARE RBC (CROSSMATCH)     Status: None   Collection Time    02/12/14  3:13 AM      Result Value Ref Range   Order Confirmation ORDER PROCESSED BY BLOOD BANK    GLUCOSE, CAPILLARY     Status: Abnormal   Collection Time    02/12/14  5:44 AM      Result Value Ref Range   Glucose-Capillary 152 (*) 70 - 99 mg/dL  GLUCOSE, CAPILLARY     Status: Abnormal   Collection Time    02/12/14  7:41 AM      Result Value Ref Range   Glucose-Capillary 142 (*) 70 - 99 mg/dL   Dg Chest Portable 1 View  02/11/2014   CLINICAL DATA:  NEAR SYNCOPE  EXAM: PORTABLE CHEST - 1 VIEW  COMPARISON:  Prior radiograph from 12/08/2013  FINDINGS: Prominent cardiomegaly is stable as compared to prior exam. Mediastinal silhouette within normal limits. Few scattered atherosclerotic calcifications present within the aortic arch.  The lungs are normally inflated. There is mild central perihilar pulmonary vascular congestion without overt pulmonary edema. No definite pleural effusion. No focal infiltrate identified. There is no pneumothorax.  No acute osseous abnormality identified.  IMPRESSION: Stable cardiomegaly with mild central perihilar vascular congestion without overt pulmonary edema.    Electronically Signed   By: Jeannine Boga M.D.   On: 02/11/2014 23:53   Review of Systems  Constitutional: Positive for malaise/fatigue. Negative for fever, chills, weight loss and diaphoresis.  HENT: Negative.   Eyes: Negative.  Respiratory: Negative.   Cardiovascular: Negative for chest pain and orthopnea.  Gastrointestinal: Positive for blood in stool. Negative for heartburn, nausea, vomiting and abdominal pain.  Genitourinary: Negative.   Musculoskeletal: Positive for back pain.  Skin: Negative.   Neurological: Positive for weakness.   Blood pressure 174/65, pulse 67, temperature 97.4 F (36.3 C), temperature source Oral, resp. rate 0, height 5' 11"  (1.803 m), weight 110.4 kg (243 lb 6.2 oz), SpO2 96.00%. Physical Exam  Constitutional: He appears well-developed and well-nourished. He appears lethargic.  HENT:  Head: Normocephalic and atraumatic.  Eyes: Conjunctivae and EOM are normal. Pupils are equal, round, and reactive to light.  Neck: Normal range of motion. Neck supple.  Cardiovascular: Normal rate and regular rhythm.   Respiratory: Effort normal and breath sounds normal.  GI: Soft. Bowel sounds are normal. He exhibits no distension. There is no tenderness. There is no rebound and no guarding.  Neurological: He appears lethargic.  Skin: Skin is warm and dry.   Assessment/Plan: 1) Rectal bleeding with anemia; will prep for a colonoscopy tomorrow and make further recommendations thereafter.  2) Diabetes mellitus/CKD.  3) Hypertension/Hyperlipidemia.  4) Non-compliance with meds and medical care.  5) Narcolepsy. MANN,JYOTHI 02/12/2014, 9:03 AM

## 2014-02-12 NOTE — Progress Notes (Signed)
[facility] Progress Note   [name] ZOX:[id number] DOB: 1934-01-16 DOA: 02/11/2014 PCP: Wyatt Haste, MD  Brief narrative: [name] is a 80 y.o. male with h/o DM, HTN, pedal edema (lymphedema?), obesity and poor compliance with seeing his PCP and taking his medications. He was admitted in March of this year for "CHF" and noted to be very non-compliant with medications. He comes in to the ER being brought in by "PTAR"? for feeling like he was about to pass out. He had heme positive stools in the ER with severe anemia.  Per the records in [facility] it appears that his PCP ordered home health for him but he kicked them out of his home (see note from [name] on 5/29- telephone conversation) and so he was dismissed by them.  Subjective: States that his doctor would no call in his refils until he went to see him but he does not want to see him again. He states that the doctor and his daughter came up with a plan to send him to the nursing home which he does not want. As far as the anemia, he states he has been passin blood in his stool for 4 days. This has been painless. He notes that he was about to pass out yesterday. He vomiting blood in 2013 and per his chart underwent EGD and colonoscopy by Dr Benson Norway.   Assessment/Plan: Principal Problem:   Acute GI bleeding - has received 2 U of blood- last bloody stool yesterday per patient- recheck Hgb post transfusion  - consulting  Dr Benson Norway - 2013- EGD- LA grade A esophagitis and mild gastritis- Colonoscopy- large mass in cecum which was biopied (adenoma) and multiple smaller benign adenomatous polyps removed.   Active Problems: Anemia due to acute blood loss superimposed on AOCD  - baseline appears to be around 10 dropping down to 6.4 on this admission -  anemia panel in 3/15 consistent with AOCD but also with very low ferretin levels (8) therefore should replace iron- will repeat panel - follow Hgb - frequency based upon  amount of bleeding he is currently having   Hypertensive urgency - resume Coreg, Lisinopril and Lasix and follow BP  Pedal edema -ECHO in 8119 revealed systolic CHF but  per last ECHO on 08/4780, no systolic failure - mild LVF but no mention of diastolic dysfunction - note made of right atrium being mild-mod dilated (suggestive of right heart failure?) - no pulm edema on admission this time - suspect mostly right heart failure or pedal edema is due to venous insufficiency - will Elevate legs and use TEDS    Diabetes mellitus - last A1c in 3/15- 7.9- keep on sliding scale for now and transition to orals upon d/c- he was sent home with Glipizide 2.5 mg last time- no use in repeating A1c as he has been non-compliant    Obesity - doubt he will be interested in losing weight    CKD (chronic kidney disease), stage III - Cr on last d/c was 2.21 and on this admission 1.55- likely lower due to fluid overload     Noncompliance with medications   Code Status: Full code Family Communication: none Disposition Plan: to be determined  Consultants: GI  Procedures: none  Antibiotics: Anti-infectives   None       DVT prophylaxis: SCDs  Objective: [facility] Weights   02/11/14 2238 02/12/14 0429  Weight: 127.007 kg (280 lb) 110.4 kg (243 lb 6.2 oz)    Vitals Filed Vitals:  02/12/14 0530 02/12/14 0600 02/12/14 0630 02/12/14 0737  BP: 216/93 191/62 178/80 191/71  Pulse: 80 71 72 62  Temp: 98.2 F (36.8 C)   97.4 F (36.3 C)  TempSrc: Oral   Oral  Resp: 10 16 16 11   Height:      Weight:      SpO2: 99% 99% 98% 99%     Intake/Output Summary (Last 24 hours) at 02/12/14 0800 Last data filed at 02/12/14 0630  Gross per 24 hour  Intake 1062.5 ml  Output   1975 ml  Net -912.5 ml     Exam: General: No acute respiratory distress, AAO x 3 Lungs: Clear to auscultation bilaterally without wheezes or crackles Cardiovascular: Regular rate and rhythm without murmur gallop or rub  normal S1 and S2 Abdomen: Nontender, nondistended, soft, bowel sounds positive, no rebound, no ascites, no appreciable mass Extremities: No significant cyanosis, clubbing, +3 pitting edema bilateral lower extremities  Data Reviewed: Basic Metabolic Panel:  Recent Labs Lab 02/11/14 2256 02/11/14 2306  NA 142 140  K 4.2 3.9  CL 104 107  CO2 22  --   GLUCOSE 171* 169*  BUN 22 21  CREATININE 1.76* 1.80*  CALCIUM 8.6  --    Liver Function Tests:  Recent Labs Lab 02/11/14 2256  AST 8  ALT <5  ALKPHOS 57  BILITOT <0.2*  PROT 6.5  ALBUMIN 3.0*   No results found for this basename: LIPASE, AMYLASE,  in the last 168 hours No results found for this basename: AMMONIA,  in the last 168 hours CBC:  Recent Labs Lab 02/11/14 2256 02/11/14 2306  WBC 5.4  --   HGB 6.4* 6.8*  HCT 20.8* 20.0*  MCV 79.4  --   PLT 216  --    Cardiac Enzymes: No results found for this basename: CKTOTAL, CKMB, CKMBINDEX, TROPONINI,  in the last 168 hours BNP (last 3 results)  Recent Labs  11/12/13 1455 12/08/13 1502 02/11/14 2256  PROBNP 2435.0* 1417.0* 2409.0*   CBG:  Recent Labs Lab 02/12/14 0544 02/12/14 0741  GLUCAP 152* 142*    Recent Results (from the past 240 hour(s))  MRSA PCR SCREENING     Status: None   Collection Time    02/12/14  3:07 AM      Result Value Ref Range Status   MRSA by PCR NEGATIVE  NEGATIVE Final   Comment:            The GeneXpert MRSA Assay (FDA     approved for NASAL specimens     only), is one component of a     comprehensive MRSA colonization     surveillance program. It is not     intended to diagnose MRSA     infection nor to guide or     monitor treatment for     MRSA infections.     Studies:  Recent x-ray studies have been reviewed in detail by the Attending Physician  Scheduled Meds:  Scheduled Meds: . carvedilol  3.125 mg Oral BID WC  . furosemide  40 mg Oral Daily  . insulin aspart  0-9 Units Subcutaneous 6 times per day  .  lisinopril  10 mg Oral Daily  . sodium chloride  3 mL Intravenous Q12H   Continuous Infusions: . sodium chloride 30 mL/hr at 02/11/14 2343    Time spent on care of this patient: 45 min   Cosby, MD 02/12/2014, 8:00 AM  LOS: 1 day   [facility]  Office  [phone] Pager - Text Page per Shea Evans   If 7PM-7AM, please contact night-coverage [email or web address]

## 2014-02-12 NOTE — Progress Notes (Signed)
Pt SBP 200+ MD aware. Will continue to monitor pt.

## 2014-02-13 ENCOUNTER — Encounter (HOSPITAL_COMMUNITY): Payer: Self-pay | Admitting: *Deleted

## 2014-02-13 ENCOUNTER — Encounter (HOSPITAL_COMMUNITY)
Admission: EM | Disposition: A | Discharge status: Skilled Nursing Facility | Payer: Self-pay | Source: Home / Self Care | Attending: Internal Medicine

## 2014-02-13 DIAGNOSIS — R609 Edema, unspecified: Secondary | ICD-10-CM

## 2014-02-13 DIAGNOSIS — K922 Gastrointestinal hemorrhage, unspecified: Secondary | ICD-10-CM

## 2014-02-13 DIAGNOSIS — D490 Neoplasm of unspecified behavior of digestive system: Secondary | ICD-10-CM

## 2014-02-13 HISTORY — PX: COLONOSCOPY: SHX5424

## 2014-02-13 LAB — TYPE AND SCREEN
ABO/RH(D): O POS
ANTIBODY SCREEN: NEGATIVE
Unit division: 0
Unit division: 0

## 2014-02-13 LAB — BASIC METABOLIC PANEL
BUN: 20 mg/dL (ref 6–23)
CHLORIDE: 101 meq/L (ref 96–112)
CO2: 25 mEq/L (ref 19–32)
Calcium: 8.8 mg/dL (ref 8.4–10.5)
Creatinine, Ser: 1.58 mg/dL — ABNORMAL HIGH (ref 0.50–1.35)
GFR calc Af Amer: 47 mL/min — ABNORMAL LOW (ref >90)
GFR calc non Af Amer: 40 mL/min — ABNORMAL LOW (ref >90)
Glucose, Bld: 140 mg/dL — ABNORMAL HIGH (ref 70–99)
Potassium: 4.5 mEq/L (ref 3.7–5.3)
Sodium: 140 mEq/L (ref 137–147)

## 2014-02-13 LAB — GLUCOSE, CAPILLARY
GLUCOSE-CAPILLARY: 128 mg/dL — AB (ref 70–99)
GLUCOSE-CAPILLARY: 142 mg/dL — AB (ref 70–99)
Glucose-Capillary: 144 mg/dL — ABNORMAL HIGH (ref 70–99)
Glucose-Capillary: 121 mg/dL — ABNORMAL HIGH (ref 70–99)
Glucose-Capillary: 108 mg/dL — ABNORMAL HIGH (ref 70–99)
Glucose-Capillary: 155 mg/dL — ABNORMAL HIGH (ref 70–99)
Glucose-Capillary: 167 mg/dL — ABNORMAL HIGH (ref 70–99)

## 2014-02-13 LAB — CBC
HEMATOCRIT: 29.9 % — AB (ref 39.0–52.0)
HEMOGLOBIN: 9.5 g/dL — AB (ref 13.0–17.0)
MCH: 25.4 pg — ABNORMAL LOW (ref 26.0–34.0)
MCHC: 31.8 g/dL (ref 30.0–36.0)
MCV: 79.9 fL (ref 78.0–100.0)
Platelets: 260 10*3/uL (ref 150–400)
RBC: 3.74 MIL/uL — ABNORMAL LOW (ref 4.22–5.81)
RDW: 15.4 % (ref 11.5–15.5)
WBC: 6.1 10*3/uL (ref 4.0–10.5)

## 2014-02-13 LAB — HEMOGLOBIN A1C
Hgb A1c MFr Bld: 7 % — ABNORMAL HIGH (ref <5.7)
MEAN PLASMA GLUCOSE: 154 mg/dL — AB (ref <117)

## 2014-02-13 SURGERY — COLONOSCOPY
Anesthesia: Moderate Sedation | Laterality: Left

## 2014-02-13 MED ORDER — FENTANYL CITRATE 0.05 MG/ML IJ SOLN
INTRAMUSCULAR | Status: DC | PRN
  Administered 2014-02-13 (×3): 25 ug via INTRAVENOUS

## 2014-02-13 MED ORDER — DIPHENHYDRAMINE HCL 50 MG/ML IJ SOLN
INTRAMUSCULAR | Status: AC
  Filled 2014-02-13: qty 1

## 2014-02-13 MED ORDER — MIDAZOLAM HCL 5 MG/5ML IJ SOLN
INTRAMUSCULAR | Status: DC | PRN
  Administered 2014-02-13: 2 mg via INTRAVENOUS
  Administered 2014-02-13: 1 mg via INTRAVENOUS
  Administered 2014-02-13: 2 mg via INTRAVENOUS

## 2014-02-13 MED ORDER — FENTANYL CITRATE 0.05 MG/ML IJ SOLN
INTRAMUSCULAR | Status: AC
  Filled 2014-02-13: qty 4

## 2014-02-13 MED ORDER — MIDAZOLAM HCL 5 MG/ML IJ SOLN
INTRAMUSCULAR | Status: AC
  Filled 2014-02-13: qty 3

## 2014-02-13 NOTE — Consult Note (Signed)
Reason for Consult:cecal mass Referring Physician: Dr. Pat Kocher is an 80 y.o. male.  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 Biopsies at that time during colonoscopy by Dr. Collene Mares demonstrated no high-grade dysplasia or malignancy. He was seen by Dr. Rosendo Gros who followed up in our office as well.  Decision was made at that time to observe with followup colonoscopy planned. He was admitted 02/10/2014 with lower GI bleed. He underwent colonoscopy today by Dr. Collene Mares. Lobulated cecal mass was again seen, now with evidence of recent hemorrhage. He denies abdominal pain and is hungry.  Past Medical History  Diagnosis Date  . Smoker   . Narcolepsy   . Hypertension   . BPH (benign prostatic hyperplasia)   . Arthritis   . Diabetes mellitus   . Neuropathy in diabetes   . Herpes   . Dyslipidemia   . Obesity   . Lymphedema     LE lymphedemia with hx of cellulitis.  . CKD (chronic kidney disease)   . Anemia   . History of GI bleed 06/2012    EGD unremarkable;  Colo with large cecal polyp (bx benign)  . Ischemic cardiomyopathy     Echo 06/19/12: EF 40-45%, mid to distal anteroseptal HK, moderate LAE, PASP 37  . Chronic systolic heart failure   . CAD (coronary artery disease)     a. probable underlying CAD;  b. Type 2 NSTEMi in setting of profound anemia (Hgb 3.6) from GI bleed in 06/2012 and WMA on echo;  c. med Rx due to CKD and GI bleed  . CHF (congestive heart failure)   . Atrial fibrillation     Past Surgical History  Procedure Laterality Date  . Leg surgery      Left leg surgery. broken leg  . Esophagogastroduodenoscopy  06/19/2012    Procedure: ESOPHAGOGASTRODUODENOSCOPY (EGD);  Surgeon: Beryle Beams, MD;  Location: [facility] ENDOSCOPY;  Service: Endoscopy;  Laterality: N/A;  . Colonoscopy  06/21/2012    Procedure: COLONOSCOPY;  Surgeon: Juanita Craver, MD;  Location: [facility] ENDOSCOPY;  Service: Endoscopy;  Laterality: N/A;     Family History  Problem Relation Age of Onset  . Brain cancer Mother   . Diabetes type II Sister   . Narcolepsy Paternal Uncle     Social History:  reports that he quit smoking about 15 years ago. He has never used smokeless tobacco. He reports that he does not drink alcohol or use illicit drugs.  Allergies: No Known Allergies  Medications:  Scheduled: . carvedilol  6.25 mg Oral BID WC  . hydrochlorothiazide  25 mg Oral Daily  . insulin aspart  0-9 Units Subcutaneous 6 times per day  . lisinopril  20 mg Oral Daily  . pantoprazole  40 mg Oral BID  . sodium chloride  3 mL Intravenous Q12H   Continuous: . sodium chloride     KDT:OIZTIW chloride, sodium chloride  Results for orders placed during the hospital encounter of 02/11/14 (from the past 48 hour(s))  PRO B NATRIURETIC PEPTIDE     Status: Abnormal   Collection Time    02/11/14 10:56 PM      Result Value Ref Range   Pro B Natriuretic peptide (BNP) 2409.0 (*) 0 - 450 pg/mL  CBC     Status: Abnormal   Collection Time    02/11/14 10:56 PM      Result Value Ref Range   WBC 5.4  4.0 -  10.5 K/uL   RBC 2.62 (*) 4.22 - 5.81 MIL/uL   Hemoglobin 6.4 (*) 13.0 - 17.0 g/dL   Comment: CRITICAL RESULT CALLED TO, READ BACK BY AND VERIFIED WITH:     WHITE,E RN 02/11/2014 2324 JORDANS     REPEATED TO VERIFY   HCT 20.8 (*) 39.0 - 52.0 %   MCV 79.4  78.0 - 100.0 fL   MCH 24.4 (*) 26.0 - 34.0 pg   MCHC 30.8  30.0 - 36.0 g/dL   RDW 15.9 (*) 11.5 - 15.5 %   Platelets 216  150 - 400 K/uL  COMPREHENSIVE METABOLIC PANEL     Status: Abnormal   Collection Time    02/11/14 10:56 PM      Result Value Ref Range   Sodium 142  137 - 147 mEq/L   Potassium 4.2  3.7 - 5.3 mEq/L   Chloride 104  96 - 112 mEq/L   CO2 22  19 - 32 mEq/L   Glucose, Bld 171 (*) 70 - 99 mg/dL   BUN 22  6 - 23 mg/dL   Creatinine, Ser 1.76 (*) 0.50 - 1.35 mg/dL   Calcium 8.6  8.4 - 10.5 mg/dL   Total Protein 6.5  6.0 - 8.3 g/dL   Albumin 3.0 (*) 3.5 - 5.2 g/dL    AST 8  0 - 37 U/L   ALT <5  0 - 53 U/L   Alkaline Phosphatase 57  39 - 117 U/L   Total Bilirubin <0.2 (*) 0.3 - 1.2 mg/dL   GFR calc non Af Amer 35 (*) >90 mL/min   GFR calc Af Amer 41 (*) >90 mL/min   Comment: (NOTE)     The eGFR has been calculated using the CKD EPI equation.     This calculation has not been validated in all clinical situations.     eGFR's persistently <90 mL/min signify possible Chronic Kidney     Disease.  PROTIME-INR     Status: None   Collection Time    02/11/14 10:56 PM      Result Value Ref Range   Prothrombin Time 13.6  11.6 - 15.2 seconds   INR 1.06  0.00 - 1.49  APTT     Status: None   Collection Time    02/11/14 10:56 PM      Result Value Ref Range   aPTT 32  24 - 37 seconds  I-STAT TROPOININ, ED     Status: None   Collection Time    02/11/14 11:04 PM      Result Value Ref Range   Troponin i, poc 0.03  0.00 - 0.08 ng/mL   Comment 3            Comment: Due to the release kinetics of cTnI,     a negative result within the first hours     of the onset of symptoms does not rule out     myocardial infarction with certainty.     If myocardial infarction is still suspected,     repeat the test at appropriate intervals.  I-STAT CHEM 8, ED     Status: Abnormal   Collection Time    02/11/14 11:06 PM      Result Value Ref Range   Sodium 140  137 - 147 mEq/L   Potassium 3.9  3.7 - 5.3 mEq/L   Chloride 107  96 - 112 mEq/L   BUN 21  6 - 23 mg/dL   Creatinine, Ser  1.80 (*) 0.50 - 1.35 mg/dL   Glucose, Bld 169 (*) 70 - 99 mg/dL   Calcium, Ion 1.14  1.13 - 1.30 mmol/L   TCO2 24  0 - 100 mmol/L   Hemoglobin 6.8 (*) 13.0 - 17.0 g/dL   HCT 20.0 (*) 39.0 - 52.0 %   Comment NOTIFIED PHYSICIAN    TYPE AND SCREEN     Status: None   Collection Time    02/11/14 11:45 PM      Result Value Ref Range   ABO/RH(D) O POS     Antibody Screen NEG     Sample Expiration 02/14/2014     Unit Number [id number]     Blood Component Type RED CELLS,LR     Unit  division 00     Status of Unit ISSUED,FINAL     Transfusion Status OK TO TRANSFUSE     Crossmatch Result Compatible     Unit Number [id number]     Blood Component Type RBC LR PHER1     Unit division 00     Status of Unit ISSUED,FINAL     Transfusion Status OK TO TRANSFUSE     Crossmatch Result Compatible    PREPARE RBC (CROSSMATCH)     Status: None   Collection Time    02/11/14 11:45 PM      Result Value Ref Range   Order Confirmation ORDER PROCESSED BY BLOOD BANK    POC OCCULT BLOOD, ED     Status: Abnormal   Collection Time    02/11/14 11:57 PM      Result Value Ref Range   Fecal Occult Bld POSITIVE (*) NEGATIVE  MRSA PCR SCREENING     Status: None   Collection Time    02/12/14  3:07 AM      Result Value Ref Range   MRSA by PCR NEGATIVE  NEGATIVE   Comment:            The GeneXpert MRSA Assay (FDA     approved for NASAL specimens     only), is one component of a     comprehensive MRSA colonization     surveillance program. It is not     intended to diagnose MRSA     infection nor to guide or     monitor treatment for     MRSA infections.  PREPARE RBC (CROSSMATCH)     Status: None   Collection Time    02/12/14  3:13 AM      Result Value Ref Range   Order Confirmation ORDER PROCESSED BY BLOOD BANK    GLUCOSE, CAPILLARY     Status: Abnormal   Collection Time    02/12/14  5:44 AM      Result Value Ref Range   Glucose-Capillary 152 (*) 70 - 99 mg/dL  GLUCOSE, CAPILLARY     Status: Abnormal   Collection Time    02/12/14  7:41 AM      Result Value Ref Range   Glucose-Capillary 142 (*) 70 - 99 mg/dL  HEMOGLOBIN A1C     Status: Abnormal   Collection Time    02/12/14  9:56 AM      Result Value Ref Range   Hemoglobin A1C 7.0 (*) <5.7 %   Comment: (NOTE)  According to the ADA Clinical Practice Recommendations for 2011, when     HbA1c is used as a screening test:      >=6.5%   Diagnostic of  Diabetes Mellitus               (if abnormal result is confirmed)     5.7-6.4%   Increased risk of developing Diabetes Mellitus     References:Diagnosis and Classification of Diabetes Mellitus,Diabetes     WUJW,1191,47(WGNFA 1):S62-S69 and Standards of Medical Care in             Diabetes - 2011,Diabetes OZHY,8657,84 (Suppl 1):S11-S61.   Mean Plasma Glucose 154 (*) <117 mg/dL   Comment: Performed at [facility]  TSH     Status: None   Collection Time    02/12/14  9:56 AM      Result Value Ref Range   TSH 1.550  0.350 - 4.500 uIU/mL  MAGNESIUM     Status: None   Collection Time    02/12/14  9:56 AM      Result Value Ref Range   Magnesium 1.6  1.5 - 2.5 mg/dL  VITAMIN B12     Status: None   Collection Time    02/12/14  9:56 AM      Result Value Ref Range   Vitamin B-12 389  211 - 911 pg/mL   Comment: Performed at [place] TIBC     Status: None   Collection Time    02/12/14  9:56 AM      Result Value Ref Range   Iron 77  42 - 135 ug/dL   TIBC 281  215 - 435 ug/dL   Saturation Ratios 27  20 - 55 %   UIBC 204  125 - 400 ug/dL   Comment: Performed at [facility]     Status: Abnormal   Collection Time    02/12/14  9:56 AM      Result Value Ref Range   Ferritin 5 (*) 22 - 322 ng/mL   Comment: Performed at [facility]  RETICULOCYTES     Status: Abnormal   Collection Time    02/12/14  9:56 AM      Result Value Ref Range   Retic Ct Pct 1.7  0.4 - 3.1 %   RBC. 3.25 (*) 4.22 - 5.81 MIL/uL   Retic Count, Manual 55.3  19.0 - 186.0 K/uL  GLUCOSE, CAPILLARY     Status: Abnormal   Collection Time    02/12/14 11:58 AM      Result Value Ref Range   Glucose-Capillary 108 (*) 70 - 99 mg/dL  CBC     Status: Abnormal   Collection Time    02/12/14  3:55 PM      Result Value Ref Range   WBC 5.5  4.0 - 10.5 K/uL   RBC 3.33 (*) 4.22 - 5.81 MIL/uL   Hemoglobin 8.4 (*) 13.0 - 17.0 g/dL   Comment: DELTA CHECK NOTED     REPEATED TO  VERIFY   HCT 26.4 (*) 39.0 - 52.0 %   MCV 79.3  78.0 - 100.0 fL   MCH 25.2 (*) 26.0 - 34.0 pg   MCHC 31.8  30.0 - 36.0 g/dL   RDW 15.2  11.5 - 15.5 %   Platelets 220  150 - 400 K/uL  GLUCOSE, CAPILLARY     Status: None   Collection Time    02/12/14  4:24 PM      Result Value Ref Range   Glucose-Capillary 97  70 - 99 mg/dL  GLUCOSE, CAPILLARY     Status: Abnormal   Collection Time    02/12/14  8:35 PM      Result Value Ref Range   Glucose-Capillary 186 (*) 70 - 99 mg/dL  GLUCOSE, CAPILLARY     Status: Abnormal   Collection Time    02/12/14 11:21 PM      Result Value Ref Range   Glucose-Capillary 144 (*) 70 - 99 mg/dL  GLUCOSE, CAPILLARY     Status: Abnormal   Collection Time    02/13/14  1:35 AM      Result Value Ref Range   Glucose-Capillary 128 (*) 70 - 99 mg/dL  BASIC METABOLIC PANEL     Status: Abnormal   Collection Time    02/13/14  2:15 AM      Result Value Ref Range   Sodium 140  137 - 147 mEq/L   Potassium 4.5  3.7 - 5.3 mEq/L   Comment: HEMOLYSIS AT THIS LEVEL MAY AFFECT RESULT   Chloride 101  96 - 112 mEq/L   CO2 25  19 - 32 mEq/L   Glucose, Bld 140 (*) 70 - 99 mg/dL   BUN 20  6 - 23 mg/dL   Creatinine, Ser 1.58 (*) 0.50 - 1.35 mg/dL   Calcium 8.8  8.4 - 10.5 mg/dL   GFR calc non Af Amer 40 (*) >90 mL/min   GFR calc Af Amer 47 (*) >90 mL/min   Comment: (NOTE)     The eGFR has been calculated using the CKD EPI equation.     This calculation has not been validated in all clinical situations.     eGFR's persistently <90 mL/min signify possible Chronic Kidney     Disease.  CBC     Status: Abnormal   Collection Time    02/13/14  2:15 AM      Result Value Ref Range   WBC 6.1  4.0 - 10.5 K/uL   RBC 3.74 (*) 4.22 - 5.81 MIL/uL   Hemoglobin 9.5 (*) 13.0 - 17.0 g/dL   HCT 29.9 (*) 39.0 - 52.0 %   MCV 79.9  78.0 - 100.0 fL   MCH 25.4 (*) 26.0 - 34.0 pg   MCHC 31.8  30.0 - 36.0 g/dL   RDW 15.4  11.5 - 15.5 %   Platelets 260  150 - 400 K/uL  GLUCOSE, CAPILLARY      Status: Abnormal   Collection Time    02/13/14  4:15 AM      Result Value Ref Range   Glucose-Capillary 142 (*) 70 - 99 mg/dL  GLUCOSE, CAPILLARY     Status: Abnormal   Collection Time    02/13/14  8:10 AM      Result Value Ref Range   Glucose-Capillary 121 (*) 70 - 99 mg/dL  GLUCOSE, CAPILLARY     Status: Abnormal   Collection Time    02/13/14 12:19 PM      Result Value Ref Range   Glucose-Capillary 108 (*) 70 - 99 mg/dL    Dg Chest Portable 1 View  02/11/2014   CLINICAL DATA:  NEAR SYNCOPE  EXAM: PORTABLE CHEST - 1 VIEW  COMPARISON:  Prior radiograph from 12/08/2013  FINDINGS: Prominent cardiomegaly is stable as compared to prior exam. Mediastinal silhouette within normal limits. Few scattered atherosclerotic calcifications present within the aortic arch.  The lungs are  normally inflated. There is mild central perihilar pulmonary vascular congestion without overt pulmonary edema. No definite pleural effusion. No focal infiltrate identified. There is no pneumothorax.  No acute osseous abnormality identified.  IMPRESSION: Stable cardiomegaly with mild central perihilar vascular congestion without overt pulmonary edema.   Electronically Signed   By: Jeannine Boga M.D.   On: 02/11/2014 23:53    Review of Systems  Constitutional: Negative for fever and chills.  HENT: Negative for nosebleeds.   Eyes: Negative.   Respiratory: Negative.   Cardiovascular: Positive for leg swelling. Negative for chest pain and palpitations.       Severe bilateral lower extremity leg swelling chronically  Gastrointestinal: Positive for blood in stool.  Genitourinary: Negative.   Musculoskeletal: Negative.   Skin: Negative.   Neurological: Negative.  Negative for headaches.  Endo/Heme/Allergies: Negative.   Psychiatric/Behavioral: Negative.    Blood pressure 159/59, pulse 57, temperature 97.2 F (36.2 C), temperature source Oral, resp. rate 13, height 5' 11"  (1.803 m), weight 239 lb 13.8 oz  (108.8 kg), SpO2 100.00%. Physical Exam  Constitutional: He is oriented to person, place, and time. He appears well-developed and well-nourished. No distress.  HENT:  Head: Normocephalic.  Mouth/Throat: Oropharynx is clear and moist. No oropharyngeal exudate.  Eyes:  Mild ptosis left eyelid  Neck: Normal range of motion. Neck supple. No tracheal deviation present.  Cardiovascular: Normal rate and normal heart sounds.   Occasional ectopy  Respiratory: Effort normal and breath sounds normal. No stridor. No respiratory distress. He has no wheezes.  GI: Soft. He exhibits no distension. There is no tenderness. There is no rebound and no guarding.  Musculoskeletal: Normal range of motion.  Neurological: He is alert and oriented to person, place, and time. He exhibits normal muscle tone.  Skin: Skin is warm.  Psychiatric: He has a normal mood and affect.    Assessment/Plan: Large cecal mass with associated lower GI bleed. 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 It may have progressed to a carcinoma. Recommend right colectomy this admission. We will make him n.p.o. after midnight in case we are able to proceed tomorrow. Biopsies are pending but I do not feel those results we'll change our recommendations. I did speak to him at some length. He would like to speak with the team again in the morning and I will discuss his case with Dr. Donne Hazel. Will check CEA with AM labs.  THOMPSON,BURKE E 02/13/2014, 1:22 PM

## 2014-02-13 NOTE — Op Note (Signed)
[facility] [place], [id number]   OPERATIVE PROCEDURE REPORT  PATIENT: [name], [name]  MR#: [id number] BIRTHDATE: [birth year] GENDER: Male ENDOSCOPIST:  Edmonia James, MD ASSISTANT:   Elna Breslow, RN Corliss Parish, technician PROCEDURE DATE: 02/13/2014 PRE-PROCEDURE PREPARATION: The patient was prepped with a gallon of Nulyt[place] the night prior to the procedure.  The patient was fasted for 8 hours prior to the procedure.  PRE-PROCEDURE PHYSICAL: Patient has stable vital signs.  Neck is supple.  There is no JVD, thyromegaly or LAD.  Chest clear to auscultation.  S1 and S2 regular.  Abdomen soft, non-distended, non-tender with NABS. PROCEDURE:     Colonoscopy with cold biopsies x 5 ASA CLASS:     Class IV INDICATIONS:     1.  Rectal bleeding.  2.  Iron deficiency anemia. 3.  Personal history of colonic polyps 4. Colorectal cancer screening. MEDICATIONS:     Fentanyl 75 mcg and Versed 5 mg IV  DESCRIPTION OF PROCEDURE: After the risks, benefits, and alternatives of the procedure were thoroughly explained [including a 10% missed rate of cancer and polyps], informed consent was obtained.  Digital rectal exam was performed.  The [id number])  was introduced through the anus  and advanced to the cecum, which was identified by both the appendix and ileocecal valve , limited by No adverse events experienced.   The quality of the prep was adequate. . Multiple washes were done. Small lesions could be missed. The instrument was then slowly withdrawn as the colon was fully examined.     COLON FINDINGS: A single dimunitive polyp was found in the rectosigmoid colon and was removed by cold biopsy x 1. A large villous polyp with a clot was noted in the cecum-biopsies done x 5. There was another polyp in the mid-ascensing colon that was not removed as the patient wil require a right hemicolectomy for the cecal polyp. The rest of the  colonic mucosa appeared healthy with a normal vascular pattern.  No masses, diverticula or AVMs were noted.  The appendiceal orifice was not identified. The ICV was identified and photographed. Retroflexed views revealed moderate sized internal hemorrhoids. The patient tolerated the procedure without immediate complications.  The scope was then withdrawn from the patient and the procedure terminated.  TIME TO CECUM:  18 minutes 00 seconds WITHDRAW TIME:  6 minutes 00 seconds  IMPRESSION:     1.  Large villous polyp/mass at the bae of the cecum with a clot-biopsies done. 2. Another 6 mm right colon polyp-not removed. 3. One dimunitive rectosigmoid polyp-removed by cold biopsy x 1. 4. Moderate sized internal hemorrhoids.   RECOMMENDATIONS:     1.  Hold all aspirin products, and anti-inflammatory medication for 2 weeks. 2.  Await pathology results. 3.  Continue current medications. 4. Surgical evaluation today.   REPEAT EXAM:      None planned for now..  If the patient has any abnormal GI symptoms in the interim, he have been advised to contact the office as soon as possible for further recommendations.    CPT CODES:     [phone], Colonoscopy with Biopsies   DIAGNOSIS CODES:     569.3 Hematochezia 280.9 Iron Deficiency Anemia V10.05 Personal History of Colon Cancer V76.51 Colorectal cancer screening  REFERRED BY: THP  eSigned:  Dr. Edmonia James, MD 02/13/2014 12:13 PM   PATIENT NAME:  [name], [name] MR#: [id number]

## 2014-02-13 NOTE — Progress Notes (Signed)
[facility] Progress Note   [name] IWP:[id number] DOB: 02/02/1934 DOA: 02/11/2014 PCP: Wyatt Haste, MD  Brief narrative: [name] is a 80 y.o. male with h/o DM, HTN, pedal edema (lymphedema?), obesity and poor compliance with seeing his PCP and taking his medications. He was admitted in March of this year for "CHF" and noted to be very non-compliant with medications. He comes in to the ER being brought in by "PTAR"? for feeling like he was about to pass out. He had heme positive stools in the ER with severe anemia.  Per the records in [facility] it appears that his PCP ordered home health for him but he kicked them out of his home (see note from [name] on 5/29- telephone conversation) and so he was dismissed by them.  Subjective: No abd pain or nausea- having frequent BMs with bowel prep but no further bleeding.    Assessment/Plan: Principal Problem:   Acute GI bleeding - has received 2 U of blood- last bloody stool occurred before admission per patient-  - consulted  DR Collene Mares who plans on a colonoscopy today - 2013- EGD- LA grade A esophagitis and mild gastritis- Colonoscopy- large mass in cecum which was biopied (adenoma) and multiple smaller benign adenomatous polyps removed.   Active Problems: Anemia due to acute blood loss superimposed on AOCD  - baseline appears to be around 10 dropping down to 6.4 on this admission and improved by 2g after 2 UPRBC -  anemia panel in 3/15 consistent with AOCD  - repeat anemia panel consistent with AOCD  - follow Hgb - frequency based upon amount of bleeding he is currently having - changed to daily today   Hypertensive urgency - resume Coreg, Lisinopril and Lasix and follow BP  Pedal edema -ECHO in 5053 revealed systolic CHF but  per last ECHO on 04/7672, no systolic failure - mild LVF but no mention of diastolic dysfunction - note made of right atrium being mild-mod dilated (suggestive of right heart failure?) - no  pulm edema on admission this time- no hypoxia - suspect mostly right heart failure or pedal edema is due to venous insufficiency - refusing TEDS and Elevation - will give low dose Lasix for symptomatic treatment- must follow as outpt with PCP to monitor response to treatment    Diabetes mellitus - last A1c in 3/15- 7.9- keep on sliding scale for now and transition to orals upon d/c- he was sent home with Glipizide 2.5 mg last time- no use in repeating A1c as he has been non-compliant    Obesity - doubt he will be interested in losing weight    CKD (chronic kidney disease), stage III - Cr on last d/c was 2.21 and on this admission 1.55     Noncompliance with medications and f/u with PCP- has kicked out home health nurses   Code Status: Full code Family Communication: none Disposition Plan: to be determined  Consultants: GI  Procedures: none  Antibiotics: Anti-infectives   None       DVT prophylaxis: SCDs  Objective: Filed Weights   02/11/14 2238 02/12/14 0429 02/13/14 0440  Weight: 127.007 kg (280 lb) 110.4 kg (243 lb 6.2 oz) 108.8 kg (239 lb 13.8 oz)    Vitals Filed Vitals:   02/12/14 2300 02/13/14 0411 02/13/14 0440 02/13/14 0809  BP: 162/66 166/77  196/84  Pulse: 74 67  70  Temp: 98.4 F (36.9 C) 98.7 F (37.1 C)  97.2 F (36.2 C)  TempSrc: Oral Oral  Oral  Resp: 20 9  18   Height:      Weight:   108.8 kg (239 lb 13.8 oz)   SpO2: 97% 97%  100%     Intake/Output Summary (Last 24 hours) at 02/13/14 1111 Last data filed at 02/12/14 1700  Gross per 24 hour  Intake    240 ml  Output      0 ml  Net    240 ml     Exam: General: No acute respiratory distress, AAO x 3 Lungs: Clear to auscultation bilaterally without wheezes or crackles Cardiovascular: Regular rate and rhythm without murmur gallop or rub normal S1 and S2 Abdomen: Nontender, nondistended, soft, bowel sounds positive, no rebound, no ascites, no appreciable mass Extremities: No  significant cyanosis, clubbing, +3 pitting edema bilateral lower extremities  Data Reviewed: Basic Metabolic Panel:  Recent Labs Lab 02/11/14 2256 02/11/14 2306 02/12/14 0956 02/13/14 0215  NA 142 140  --  140  K 4.2 3.9  --  4.5  CL 104 107  --  101  CO2 22  --   --  25  GLUCOSE 171* 169*  --  140*  BUN 22 21  --  20  CREATININE 1.76* 1.80*  --  1.58*  CALCIUM 8.6  --   --  8.8  MG  --   --  1.6  --    Liver Function Tests:  Recent Labs Lab 02/11/14 2256  AST 8  ALT <5  ALKPHOS 57  BILITOT <0.2*  PROT 6.5  ALBUMIN 3.0*   No results found for this basename: LIPASE, AMYLASE,  in the last 168 hours No results found for this basename: AMMONIA,  in the last 168 hours CBC:  Recent Labs Lab 02/11/14 2256 02/11/14 2306 02/12/14 1555 02/13/14 0215  WBC 5.4  --  5.5 6.1  HGB 6.4* 6.8* 8.4* 9.5*  HCT 20.8* 20.0* 26.4* 29.9*  MCV 79.4  --  79.3 79.9  PLT 216  --  220 260   Cardiac Enzymes: No results found for this basename: CKTOTAL, CKMB, CKMBINDEX, TROPONINI,  in the last 168 hours BNP (last 3 results)  Recent Labs  11/12/13 1455 12/08/13 1502 02/11/14 2256  PROBNP 2435.0* 1417.0* 2409.0*   CBG:  Recent Labs Lab 02/12/14 1624 02/12/14 2035 02/12/14 2321 02/13/14 0135 02/13/14 0415  GLUCAP 97 186* 144* 128* 142*    Recent Results (from the past 240 hour(s))  MRSA PCR SCREENING     Status: None   Collection Time    02/12/14  3:07 AM      Result Value Ref Range Status   MRSA by PCR NEGATIVE  NEGATIVE Final   Comment:            The GeneXpert MRSA Assay (FDA     approved for NASAL specimens     only), is one component of a     comprehensive MRSA colonization     surveillance program. It is not     intended to diagnose MRSA     infection nor to guide or     monitor treatment for     MRSA infections.     Studies:  Recent x-ray studies have been reviewed in detail by the Attending Physician  Scheduled Meds:  Scheduled Meds: . carvedilol   6.25 mg Oral BID WC  . hydrochlorothiazide  25 mg Oral Daily  . insulin aspart  0-9 Units Subcutaneous 6 times per day  . lisinopril  20 mg Oral Daily  .  pantoprazole  40 mg Oral BID  . sodium chloride  3 mL Intravenous Q12H   Continuous Infusions: . sodium chloride      Time spent on care of this patient: 55 min   Norwood [place], MD 02/13/2014, 11:11 AM  LOS: 2 days   [facility] Office  [phone] Pager - Text Page per Shea Evans   If 7PM-7AM, please contact night-coverage [email or web address]

## 2014-02-14 ENCOUNTER — Encounter (HOSPITAL_COMMUNITY): Payer: Self-pay | Admitting: Gastroenterology

## 2014-02-14 DIAGNOSIS — D490 Neoplasm of unspecified behavior of digestive system: Secondary | ICD-10-CM

## 2014-02-14 LAB — BASIC METABOLIC PANEL
BUN: 15 mg/dL (ref 6–23)
CALCIUM: 8.1 mg/dL — AB (ref 8.4–10.5)
CO2: 26 mEq/L (ref 19–32)
Chloride: 102 mEq/L (ref 96–112)
Creatinine, Ser: 1.48 mg/dL — ABNORMAL HIGH (ref 0.50–1.35)
GFR calc Af Amer: 50 mL/min — ABNORMAL LOW (ref >90)
GFR, EST NON AFRICAN AMERICAN: 44 mL/min — AB (ref >90)
GLUCOSE: 130 mg/dL — AB (ref 70–99)
Potassium: 3.8 mEq/L (ref 3.7–5.3)
Sodium: 137 mEq/L (ref 137–147)

## 2014-02-14 LAB — GLUCOSE, CAPILLARY
GLUCOSE-CAPILLARY: 118 mg/dL — AB (ref 70–99)
GLUCOSE-CAPILLARY: 159 mg/dL — AB (ref 70–99)
Glucose-Capillary: 136 mg/dL — ABNORMAL HIGH (ref 70–99)
Glucose-Capillary: 133 mg/dL — ABNORMAL HIGH (ref 70–99)
Glucose-Capillary: 186 mg/dL — ABNORMAL HIGH (ref 70–99)

## 2014-02-14 LAB — CBC
HCT: 25 % — ABNORMAL LOW (ref 39.0–52.0)
HEMOGLOBIN: 7.9 g/dL — AB (ref 13.0–17.0)
MCH: 25.3 pg — ABNORMAL LOW (ref 26.0–34.0)
MCHC: 31.6 g/dL (ref 30.0–36.0)
MCV: 80.1 fL (ref 78.0–100.0)
PLATELETS: 220 10*3/uL (ref 150–400)
RBC: 3.12 MIL/uL — ABNORMAL LOW (ref 4.22–5.81)
RDW: 15.6 % — ABNORMAL HIGH (ref 11.5–15.5)
WBC: 4.8 10*3/uL (ref 4.0–10.5)

## 2014-02-14 LAB — HEMOGLOBIN AND HEMATOCRIT, BLOOD
HEMATOCRIT: 26.7 % — AB (ref 39.0–52.0)
Hemoglobin: 8.4 g/dL — ABNORMAL LOW (ref 13.0–17.0)

## 2014-02-14 LAB — CEA

## 2014-02-14 MED ORDER — INSULIN ASPART 100 UNIT/ML ~~LOC~~ SOLN
0.0000 [IU] | SUBCUTANEOUS | Status: DC
  Administered 2014-02-15 – 2014-02-16 (×2): 2 [IU] via SUBCUTANEOUS
  Administered 2014-02-16: 1 [IU] via SUBCUTANEOUS
  Administered 2014-02-16 – 2014-02-17 (×2): 2 [IU] via SUBCUTANEOUS
  Administered 2014-02-17 – 2014-02-22 (×7): 1 [IU] via SUBCUTANEOUS
  Administered 2014-02-22: 2 [IU] via SUBCUTANEOUS
  Administered 2014-02-22 – 2014-02-23 (×5): 1 [IU] via SUBCUTANEOUS
  Administered 2014-02-23: 2 [IU] via SUBCUTANEOUS
  Administered 2014-02-23 (×3): 1 [IU] via SUBCUTANEOUS
  Administered 2014-02-24: 2 [IU] via SUBCUTANEOUS
  Administered 2014-02-24 (×2): 1 [IU] via SUBCUTANEOUS

## 2014-02-14 MED ORDER — HYDROCHLOROTHIAZIDE 25 MG PO TABS
25.0000 mg | ORAL_TABLET | Freq: Every day | ORAL | Status: DC
  Administered 2014-02-16: 25 mg via ORAL
  Filled 2014-02-14 (×2): qty 1

## 2014-02-14 MED ORDER — ALVIMOPAN 12 MG PO CAPS
12.0000 mg | ORAL_CAPSULE | Freq: Once | ORAL | Status: AC
  Administered 2014-02-14: 12 mg via ORAL
  Filled 2014-02-14: qty 1

## 2014-02-14 MED ORDER — PANTOPRAZOLE SODIUM 40 MG PO TBEC
40.0000 mg | DELAYED_RELEASE_TABLET | Freq: Every day | ORAL | Status: DC
  Administered 2014-02-14: 40 mg via ORAL
  Filled 2014-02-14: qty 1

## 2014-02-14 MED ORDER — CARVEDILOL 6.25 MG PO TABS
6.2500 mg | ORAL_TABLET | Freq: Two times a day (BID) | ORAL | Status: DC
  Administered 2014-02-14 – 2014-02-19 (×9): 6.25 mg via ORAL
  Filled 2014-02-14 (×13): qty 1

## 2014-02-14 MED ORDER — LISINOPRIL 20 MG PO TABS
20.0000 mg | ORAL_TABLET | Freq: Every day | ORAL | Status: DC
  Administered 2014-02-16: 20 mg via ORAL
  Filled 2014-02-14 (×2): qty 1

## 2014-02-14 MED ORDER — CEFOXITIN SODIUM 2 G IV SOLR
2.0000 g | INTRAVENOUS | Status: DC
  Filled 2014-02-14 (×2): qty 2

## 2014-02-14 MED ORDER — ALVIMOPAN 12 MG PO CAPS
12.0000 mg | ORAL_CAPSULE | Freq: Two times a day (BID) | ORAL | Status: DC
  Administered 2014-02-15 – 2014-02-17 (×4): 12 mg via ORAL
  Filled 2014-02-14 (×8): qty 1

## 2014-02-14 MED ORDER — HYDRALAZINE HCL 20 MG/ML IJ SOLN: 10.0000 mg | Freq: Four times a day (QID) | INTRAMUSCULAR | Status: DC | PRN

## 2014-02-14 MED ORDER — HYDRALAZINE HCL 20 MG/ML IJ SOLN
10.0000 mg | Freq: Four times a day (QID) | INTRAMUSCULAR | Status: DC | PRN
  Filled 2014-02-14: qty 1

## 2014-02-14 NOTE — Progress Notes (Signed)
Pt refused to take prn hydralazine for sbp in the 190s, claiming that it is normal for him. Pt was educated on the possible complications of sustained high blood pressures. Pt is alert and oriented and understands the risks.

## 2014-02-14 NOTE — Progress Notes (Signed)
[facility] Progress Note  1 Day Post-Op  Subjective: Pt denies any abdominal pain or N/V. C/o multiple loose BM's since given prep for colonoscopy.  Pt understands he needs to have surgery to take out his R. Colon.    Objective: Vital signs in last 24 hours: Temp:  [97.2 F (36.2 C)-98.7 F (37.1 C)] 98.7 F (37.1 C) (06/22 0802) Pulse Rate:  [53-73] 53 (06/21 2300) Resp:  [9-19] 13 (06/22 0802) BP: (117-207)/(46-81) 195/67 mmHg (06/22 0802) SpO2:  [97 %-100 %] 99 % (06/22 0802) Weight:  [238 lb 5.1 oz (108.1 kg)] 238 lb 5.1 oz (108.1 kg) (06/22 0600) Last BM Date: 02/13/14  Intake/Output from previous day: 06/21 0701 - 06/22 0700 In: 550 [P.O.:550] Out: 300 [Urine:300] Intake/Output this shift: Total I/O In: 3 [I.V.:3] Out: -   PE: Gen:  Alert, NAD, pleasant Card:  RRR, no M/G/R heard Pulm:  CTA, no W/R/R  Abd: Soft, NT, distended >upper abdomen, +BS, no HSM, no abdominal scars/hernias noted    Lab Results:   Recent Labs  02/13/14 0215 02/14/14 0225  WBC 6.1 4.8  HGB 9.5* 7.9*  HCT 29.9* 25.0*  PLT 260 220   BMET  Recent Labs  02/13/14 0215 02/14/14 0225  NA 140 137  K 4.5 3.8  CL 101 102  CO2 25 26  GLUCOSE 140* 130*  BUN 20 15  CREATININE 1.58* 1.48*  CALCIUM 8.8 8.1*   PT/INR  Recent Labs  02/11/14 2256  LABPROT 13.6  INR 1.06   CMP     Component Value Date/Time   NA 137 02/14/2014 0225   K 3.8 02/14/2014 0225   CL 102 02/14/2014 0225   CO2 26 02/14/2014 0225   GLUCOSE 130* 02/14/2014 0225   BUN 15 02/14/2014 0225   CREATININE 1.48* 02/14/2014 0225   CREATININE 1.56* 10/07/2013 1141   CALCIUM 8.1* 02/14/2014 0225   PROT 6.5 02/11/2014 2256   ALBUMIN 3.0* 02/11/2014 2256   AST 8 02/11/2014 2256   ALT <5 02/11/2014 2256   ALKPHOS 57 02/11/2014 2256   BILITOT <0.2* 02/11/2014 2256   GFRNONAA 44* 02/14/2014 0225   GFRAA 50* 02/14/2014 0225   Lipase     Component Value Date/Time   LIPASE 49 07/26/2013 2246        Studies/Results: No results found.  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  Will need Right colectomy this admission, tomorrow hopefully if schedule allows 2.  Clears, NPO after MN, IVF, pain control, antiemetics 3.  Biopsies are pending, CEA pending 4.  Patient agreeable to surgery, but he's very sleepy this am.  Dr. Donne Hazel to discuss procedure in detail later today.   5.  Okay to have lovenox or heparin for DVT proplylaxis, may need to hold for surgery although sometimes we use pre-op.    LOS: 3 days    Coralie Keens 02/14/2014, 9:24 AM Pager: [phone]

## 2014-02-14 NOTE — Progress Notes (Signed)
Pt transferred to 6N25 and report given to [employer]. Pt is in stable condition

## 2014-02-14 NOTE — Progress Notes (Signed)
CRITICAL VALUE ALERT  Critical value received:  Hgb=7.9  Date of notification:  02/14/2014  Time of notification:  0320  Critical value read back:yes  Nurse who received alert:  Purcell Nails  MD notified (1st page):  Donald Siva  Time of first page:  0320  MD notified (2nd page):  Time of second page:  Responding MD:  Donald Siva  Time MD responded:  [phone]

## 2014-02-14 NOTE — Progress Notes (Signed)
Discussed lap assisted right colectomy at length with risks/benefits/options.  All questions answered.  Will proceed in am tomorrow

## 2014-02-14 NOTE — Progress Notes (Signed)
[facility] Progress Note   [name] TLX:[id number] DOB: [birth year] DOA: 02/11/2014 PCP: Wyatt Haste, MD  Brief narrative: [name] is a 80 y.o. male with h/o DM, HTN, pedal edema (lymphedema?), obesity and poor compliance with seeing his PCP and taking his medications. He was admitted in March of this year for "CHF" and noted to be very non-compliant with medications. He comes in to the ER being brought in by "PTAR"? for feeling like he was about to pass out. He had heme positive stools in the ER with severe anemia.  Per the records in EP[facility] it appears that his PCP ordered home health for him but he kicked them out of his home (see note from [name] on 5/29- telephone conversation) and so he was dismissed by them.  Subjective: Upset that we are trying to bring down his BP. States that he is accustomed to the high BP and lower level make him sleepy. He is in agreement to have the cecal mass removed today understanding that it may be cancerous. He has no complaints other than wanting to eat/drink.    Assessment/Plan: Principal Problem:   Acute GI bleeding - has received 2 U of blood- last bloody stool occurred before admission per patient-  - consulted  DR Collene Mares who performed colonoscopy on 6/20 which revealed a cecal mass (which was seen 2 yrs ago as wel) with and attached clot - 2013- EGD- LA grade A esophagitis and mild gastritis- Colonoscopy- large mass in cecum which was biopsied (adenoma) and multiple smaller benign adenomatous polyps removed.  - will keep on Protonix for now to prevent a stress ulcer can d/c prior to discharge  Active Problems: Cecal mass - surgery to perform hemicolectomy today- will need to f/u on path report  Anemia due to acute blood loss superimposed on AOCD  - baseline appears to be around 10 dropping down to 6.4 on this admission and improved by 2g after 2 UPRBC -  anemia panel in 3/15 consistent with AOCD  - repeat anemia  panel consistent with AOCD  -  Hgb 7.9 today - suspect this is inacurate as he has not had any overt bleeding since being hospitalized- will repeat  Hypertensive urgency - resumed Coreg and Lisinopril- did not have his meds at home - added HCTZ - PRN hydralazine only if SBP > 180 or DBP > 100  Lymphedema  -ECHO in 7416 revealed systolic CHF but  per last ECHO on 10/8451, no systolic failure - mild LVF but no mention of diastolic dysfunction - note made of right atrium being mild-mod dilated (suggestive of right heart failure?) - no pulm edema on admission this time- no hypoxia -has been treated in the past with ACE wraps-     Diabetes mellitus - last A1c in 3/15- 7.9- keep on sliding scale for now and transition to orals upon d/c- he was sent home with Glipizide 2.5 mg last time- no use in repeating A1c as he has been non-compliant    Obesity - doubt he will be interested in losing weight    CKD (chronic kidney disease), stage III - Cr on last d/c was 2.21 and on this admission 1.55     Noncompliance with medications and f/u with PCP- has kicked out home health nurses   Code Status: Full code Family Communication: none Disposition Plan: to be determined  Consultants: GI  Procedures: none  Antibiotics: Anti-infectives   None       DVT prophylaxis: SCDs  Objective: Filed Weights   02/12/14 0429 02/13/14 0440 02/14/14 0600  Weight: 110.4 kg (243 lb 6.2 oz) 108.8 kg (239 lb 13.8 oz) 108.1 kg (238 lb 5.1 oz)    Vitals Filed Vitals:   02/14/14 0000 02/14/14 0200 02/14/14 0400 02/14/14 0600  BP: 180/60 177/76 186/54 180/76  Pulse:      Temp: 98.2 F (36.8 C)     TempSrc:      Resp: 14 16 16 15   Height:      Weight:    108.1 kg (238 lb 5.1 oz)  SpO2: 98%        Intake/Output Summary (Last 24 hours) at 02/14/14 0759 Last data filed at 02/14/14 0300  Gross per 24 hour  Intake    550 ml  Output    300 ml  Net    250 ml     Exam: General: No acute  respiratory distress, AAO x 3 Lungs: Clear to auscultation bilaterally without wheezes or crackles Cardiovascular: Regular rate and rhythm without murmur gallop or rub normal S1 and S2 Abdomen: Nontender, nondistended, soft, bowel sounds positive, no rebound, no ascites, no appreciable mass Extremities: No significant cyanosis, clubbing, +3 pitting edema bilateral lower extremities  Data Reviewed: Basic Metabolic Panel:  Recent Labs Lab 02/11/14 2256 02/11/14 2306 02/12/14 0956 02/13/14 0215 02/14/14 0225  NA 142 140  --  140 137  K 4.2 3.9  --  4.5 3.8  CL 104 107  --  101 102  CO2 22  --   --  25 26  GLUCOSE 171* 169*  --  140* 130*  BUN 22 21  --  20 15  CREATININE 1.76* 1.80*  --  1.58* 1.48*  CALCIUM 8.6  --   --  8.8 8.1*  MG  --   --  1.6  --   --    Liver Function Tests:  Recent Labs Lab 02/11/14 2256  AST 8  ALT <5  ALKPHOS 57  BILITOT <0.2*  PROT 6.5  ALBUMIN 3.0*   No results found for this basename: LIPASE, AMYLASE,  in the last 168 hours No results found for this basename: AMMONIA,  in the last 168 hours CBC:  Recent Labs Lab 02/11/14 2256 02/11/14 2306 02/12/14 1555 02/13/14 0215 02/14/14 0225  WBC 5.4  --  5.5 6.1 4.8  HGB 6.4* 6.8* 8.4* 9.5* 7.9*  HCT 20.8* 20.0* 26.4* 29.9* 25.0*  MCV 79.4  --  79.3 79.9 80.1  PLT 216  --  220 260 220   Cardiac Enzymes: No results found for this basename: CKTOTAL, CKMB, CKMBINDEX, TROPONINI,  in the last 168 hours BNP (last 3 results)  Recent Labs  11/12/13 1455 12/08/13 1502 02/11/14 2256  PROBNP 2435.0* 1417.0* 2409.0*   CBG:  Recent Labs Lab 02/13/14 0415 02/13/14 0810 02/13/14 1219 02/13/14 1706 02/13/14 2106  GLUCAP 142* 121* 108* 155* 167*    Recent Results (from the past 240 hour(s))  MRSA PCR SCREENING     Status: None   Collection Time    02/12/14  3:07 AM      Result Value Ref Range Status   MRSA by PCR NEGATIVE  NEGATIVE Final   Comment:            The GeneXpert MRSA  Assay (FDA     approved for NASAL specimens     only), is one component of a     comprehensive MRSA colonization     surveillance program. It is  not     intended to diagnose MRSA     infection nor to guide or     monitor treatment for     MRSA infections.     Studies:  Recent x-ray studies have been reviewed in detail by the Attending Physician  Scheduled Meds:  Scheduled Meds: . carvedilol  6.25 mg Oral BID WC  . hydrochlorothiazide  25 mg Oral Daily  . insulin aspart  0-9 Units Subcutaneous 6 times per day  . lisinopril  20 mg Oral Daily  . pantoprazole  40 mg Oral Daily  . sodium chloride  3 mL Intravenous Q12H   Continuous Infusions: . sodium chloride      Time spent on care of this patient: 35 min   [place], MD 02/14/2014, 7:59 AM  LOS: 3 days   [facility] Office  [phone] Pager - Text Page per Shea Evans   If 7PM-7AM, please contact night-coverage [email or web address]

## 2014-02-14 NOTE — Progress Notes (Addendum)
Patient refusing administration of PRN BP medication, states he has lived his whole life with high blood pressure.  RN attempted to inquire further as to why patient does not want to lower blood pressure and patient became irritated.  MD notified and will allow for SBP 160's-180's.  Patient notified and will continue to monitor. [name], Ardeth Sportsman

## 2014-02-15 ENCOUNTER — Encounter (HOSPITAL_COMMUNITY)
Admission: EM | Disposition: A | Discharge status: Skilled Nursing Facility | Payer: Self-pay | Source: Home / Self Care | Attending: Internal Medicine

## 2014-02-15 ENCOUNTER — Other Ambulatory Visit (INDEPENDENT_AMBULATORY_CARE_PROVIDER_SITE_OTHER): Payer: Self-pay | Admitting: General Surgery

## 2014-02-15 ENCOUNTER — Encounter (HOSPITAL_COMMUNITY): Payer: Medicare Other | Admitting: Anesthesiology

## 2014-02-15 ENCOUNTER — Inpatient Hospital Stay (HOSPITAL_COMMUNITY): Payer: Medicare Other | Admitting: Anesthesiology

## 2014-02-15 ENCOUNTER — Encounter (HOSPITAL_COMMUNITY): Payer: Self-pay | Admitting: Anesthesiology

## 2014-02-15 DIAGNOSIS — C189 Malignant neoplasm of colon, unspecified: Secondary | ICD-10-CM

## 2014-02-15 HISTORY — PX: LAPAROSCOPIC RIGHT COLECTOMY: SHX5925

## 2014-02-15 LAB — CBC
HEMATOCRIT: 26.9 % — AB (ref 39.0–52.0)
HEMOGLOBIN: 8.6 g/dL — AB (ref 13.0–17.0)
MCH: 25.6 pg — ABNORMAL LOW (ref 26.0–34.0)
MCHC: 32 g/dL (ref 30.0–36.0)
MCV: 80.1 fL (ref 78.0–100.0)
PLATELETS: 231 10*3/uL (ref 150–400)
RBC: 3.36 MIL/uL — AB (ref 4.22–5.81)
RDW: 15.8 % — ABNORMAL HIGH (ref 11.5–15.5)
WBC: 4.1 10*3/uL (ref 4.0–10.5)

## 2014-02-15 LAB — BASIC METABOLIC PANEL
BUN: 12 mg/dL (ref 6–23)
CALCIUM: 8.4 mg/dL (ref 8.4–10.5)
CHLORIDE: 102 meq/L (ref 96–112)
CO2: 26 mEq/L (ref 19–32)
Creatinine, Ser: 1.47 mg/dL — ABNORMAL HIGH (ref 0.50–1.35)
GFR calc Af Amer: 51 mL/min — ABNORMAL LOW (ref >90)
GFR calc non Af Amer: 44 mL/min — ABNORMAL LOW (ref >90)
GLUCOSE: 131 mg/dL — AB (ref 70–99)
POTASSIUM: 3.8 meq/L (ref 3.7–5.3)
SODIUM: 139 meq/L (ref 137–147)

## 2014-02-15 LAB — GLUCOSE, CAPILLARY
GLUCOSE-CAPILLARY: 162 mg/dL — AB (ref 70–99)
Glucose-Capillary: 130 mg/dL — ABNORMAL HIGH (ref 70–99)
Glucose-Capillary: 178 mg/dL — ABNORMAL HIGH (ref 70–99)
Glucose-Capillary: 186 mg/dL — ABNORMAL HIGH (ref 70–99)
Glucose-Capillary: 196 mg/dL — ABNORMAL HIGH (ref 70–99)

## 2014-02-15 LAB — SURGICAL PCR SCREEN
MRSA, PCR: NEGATIVE
Staphylococcus aureus: NEGATIVE

## 2014-02-15 SURGERY — COLECTOMY, RIGHT, LAPAROSCOPIC
Anesthesia: General | Laterality: Right

## 2014-02-15 MED ORDER — NEOSTIGMINE METHYLSULFATE 10 MG/10ML IV SOLN
INTRAVENOUS | Status: DC | PRN
  Administered 2014-02-15: 4 mg via INTRAVENOUS

## 2014-02-15 MED ORDER — NEOSTIGMINE METHYLSULFATE 10 MG/10ML IV SOLN
INTRAVENOUS | Status: AC
  Filled 2014-02-15: qty 1

## 2014-02-15 MED ORDER — HYDROMORPHONE HCL PF 1 MG/ML IJ SOLN
0.2500 mg | INTRAMUSCULAR | Status: DC | PRN
  Administered 2014-02-15: 0.5 mg via INTRAVENOUS

## 2014-02-15 MED ORDER — EPHEDRINE SULFATE 50 MG/ML IJ SOLN
INTRAMUSCULAR | Status: DC | PRN
  Administered 2014-02-15: 10 mg via INTRAVENOUS

## 2014-02-15 MED ORDER — PROPOFOL 10 MG/ML IV BOLUS
INTRAVENOUS | Status: AC
Start: 2014-02-15 — End: 2014-02-15
  Filled 2014-02-15: qty 20

## 2014-02-15 MED ORDER — PHENYLEPHRINE HCL 10 MG/ML IJ SOLN
INTRAMUSCULAR | Status: DC | PRN
  Administered 2014-02-15 (×2): 80 ug via INTRAVENOUS

## 2014-02-15 MED ORDER — PHENYLEPHRINE 40 MCG/ML (10ML) SYRINGE FOR IV PUSH (FOR BLOOD PRESSURE SUPPORT)
PREFILLED_SYRINGE | INTRAVENOUS | Status: AC
  Filled 2014-02-15: qty 10

## 2014-02-15 MED ORDER — SODIUM CHLORIDE 0.9 % IJ SOLN: 9.0000 mL | INTRAMUSCULAR | Status: DC | PRN

## 2014-02-15 MED ORDER — FENTANYL CITRATE 0.05 MG/ML IJ SOLN
INTRAMUSCULAR | Status: AC
  Filled 2014-02-15: qty 5

## 2014-02-15 MED ORDER — PROMETHAZINE HCL 25 MG/ML IJ SOLN
6.2500 mg | INTRAMUSCULAR | Status: DC | PRN
Start: 2014-02-15 — End: 2014-02-15
  Administered 2014-02-15: 6.25 mg via INTRAVENOUS

## 2014-02-15 MED ORDER — NALOXONE HCL 0.4 MG/ML IJ SOLN
0.4000 mg | INTRAMUSCULAR | Status: DC | PRN
Start: 2014-02-15 — End: 2014-02-21

## 2014-02-15 MED ORDER — ONDANSETRON HCL 4 MG/2ML IJ SOLN
4.0000 mg | Freq: Four times a day (QID) | INTRAMUSCULAR | Status: DC | PRN
  Administered 2014-02-16: 4 mg via INTRAVENOUS
  Filled 2014-02-15 (×2): qty 2

## 2014-02-15 MED ORDER — ONDANSETRON HCL 4 MG/2ML IJ SOLN
INTRAMUSCULAR | Status: DC | PRN
  Administered 2014-02-15: 4 mg via INTRAVENOUS

## 2014-02-15 MED ORDER — MIDAZOLAM HCL 2 MG/2ML IJ SOLN: 0.5000 mg | Freq: Once | INTRAMUSCULAR | Status: DC | PRN

## 2014-02-15 MED ORDER — LIDOCAINE HCL (CARDIAC) 20 MG/ML IV SOLN
INTRAVENOUS | Status: AC
Start: 2014-02-15 — End: 2014-02-15
  Filled 2014-02-15: qty 5

## 2014-02-15 MED ORDER — LACTATED RINGERS IV SOLN
INTRAVENOUS | Status: DC | PRN
  Administered 2014-02-15: 07:00:00 via INTRAVENOUS

## 2014-02-15 MED ORDER — SODIUM CHLORIDE 0.9 % IJ SOLN
INTRAMUSCULAR | Status: AC
  Filled 2014-02-15: qty 10

## 2014-02-15 MED ORDER — ROCURONIUM BROMIDE 50 MG/5ML IV SOLN
INTRAVENOUS | Status: AC
Start: 2014-02-15 — End: 2014-02-15
  Filled 2014-02-15: qty 1

## 2014-02-15 MED ORDER — BUPIVACAINE HCL (PF) 0.25 % IJ SOLN
INTRAMUSCULAR | Status: AC
  Filled 2014-02-15: qty 30

## 2014-02-15 MED ORDER — PROPOFOL 10 MG/ML IV BOLUS
INTRAVENOUS | Status: DC | PRN
  Administered 2014-02-15: 120 mg via INTRAVENOUS

## 2014-02-15 MED ORDER — ONDANSETRON HCL 4 MG/2ML IJ SOLN
INTRAMUSCULAR | Status: AC
  Filled 2014-02-15: qty 2

## 2014-02-15 MED ORDER — PROMETHAZINE HCL 25 MG/ML IJ SOLN
INTRAMUSCULAR | Status: AC
  Administered 2014-02-15: 19:00:00
  Filled 2014-02-15: qty 1

## 2014-02-15 MED ORDER — GLYCOPYRROLATE 0.2 MG/ML IJ SOLN
INTRAMUSCULAR | Status: AC
  Filled 2014-02-15: qty 3

## 2014-02-15 MED ORDER — LIDOCAINE HCL (CARDIAC) 20 MG/ML IV SOLN
INTRAVENOUS | Status: DC | PRN
  Administered 2014-02-15: 40 mg via INTRAVENOUS

## 2014-02-15 MED ORDER — BUPIVACAINE-EPINEPHRINE (PF) 0.25% -1:200000 IJ SOLN
INTRAMUSCULAR | Status: AC
Start: 2014-02-15 — End: 2014-02-15
  Filled 2014-02-15: qty 30

## 2014-02-15 MED ORDER — DIPHENHYDRAMINE HCL 50 MG/ML IJ SOLN: 12.5000 mg | Freq: Four times a day (QID) | INTRAMUSCULAR | Status: DC | PRN

## 2014-02-15 MED ORDER — DEXTROSE 5 % IV SOLN
2.0000 g | Freq: Three times a day (TID) | INTRAVENOUS | Status: AC
  Administered 2014-02-15 (×2): 2 g via INTRAVENOUS
  Filled 2014-02-15 (×3): qty 2

## 2014-02-15 MED ORDER — DEXTROSE 5 % IV SOLN
2.0000 g | INTRAVENOUS | Status: DC
  Filled 2014-02-15: qty 2

## 2014-02-15 MED ORDER — SODIUM CHLORIDE 0.9 % IV SOLN
2.0000 g | INTRAVENOUS | Status: DC | PRN
  Administered 2014-02-15: 2 g via INTRAVENOUS

## 2014-02-15 MED ORDER — ROCURONIUM BROMIDE 100 MG/10ML IV SOLN
INTRAVENOUS | Status: DC | PRN
  Administered 2014-02-15: 50 mg via INTRAVENOUS
  Administered 2014-02-15: 10 mg via INTRAVENOUS
  Administered 2014-02-15: 20 mg via INTRAVENOUS

## 2014-02-15 MED ORDER — FENTANYL CITRATE 0.05 MG/ML IJ SOLN
INTRAMUSCULAR | Status: DC | PRN
  Administered 2014-02-15: 150 ug via INTRAVENOUS
  Administered 2014-02-15 (×3): 50 ug via INTRAVENOUS

## 2014-02-15 MED ORDER — MORPHINE SULFATE (PF) 1 MG/ML IV SOLN
INTRAVENOUS | Status: AC
  Administered 2014-02-15: 19:00:00
  Filled 2014-02-15: qty 25

## 2014-02-15 MED ORDER — OXYCODONE HCL 5 MG PO TABS
ORAL_TABLET | ORAL | Status: AC
  Administered 2014-02-15: 19:00:00
  Filled 2014-02-15: qty 1

## 2014-02-15 MED ORDER — ROCURONIUM BROMIDE 50 MG/5ML IV SOLN
INTRAVENOUS | Status: AC
  Filled 2014-02-15: qty 1

## 2014-02-15 MED ORDER — OXYCODONE HCL 5 MG PO TABS
5.0000 mg | ORAL_TABLET | Freq: Once | ORAL | Status: AC | PRN
  Administered 2014-02-15: 5 mg via ORAL

## 2014-02-15 MED ORDER — SODIUM CHLORIDE 0.9 % IR SOLN
Status: DC | PRN
  Administered 2014-02-15: 2000 mL

## 2014-02-15 MED ORDER — DEXTROSE 5 % IV SOLN
2.0000 g | Freq: Once | INTRAVENOUS | Status: DC
  Administered 2014-02-15: 2 g via INTRAVENOUS
  Filled 2014-02-15 (×2): qty 2

## 2014-02-15 MED ORDER — EPHEDRINE SULFATE 50 MG/ML IJ SOLN
INTRAMUSCULAR | Status: AC
  Filled 2014-02-15: qty 1

## 2014-02-15 MED ORDER — HYDROMORPHONE HCL PF 1 MG/ML IJ SOLN
INTRAMUSCULAR | Status: AC
  Filled 2014-02-15: qty 1

## 2014-02-15 MED ORDER — MORPHINE SULFATE (PF) 1 MG/ML IV SOLN
INTRAVENOUS | Status: DC
  Administered 2014-02-15: 12.8 mg via INTRAVENOUS
  Administered 2014-02-15 (×2): via INTRAVENOUS
  Administered 2014-02-15: 12 mg via INTRAVENOUS
  Administered 2014-02-15: 11.8 mg via INTRAVENOUS
  Administered 2014-02-16 (×3): 1 mg via INTRAVENOUS
  Administered 2014-02-17: 2 mg via INTRAVENOUS
  Administered 2014-02-17: 5 mg via INTRAVENOUS
  Administered 2014-02-18: 3 mg via INTRAVENOUS
  Administered 2014-02-18: 7 mg via INTRAVENOUS
  Administered 2014-02-18: 5 mg via INTRAVENOUS
  Administered 2014-02-18: 07:00:00 via INTRAVENOUS
  Administered 2014-02-18: 5 mg via INTRAVENOUS
  Administered 2014-02-18: 4 mg via INTRAVENOUS
  Administered 2014-02-19: 8.22 mg via INTRAVENOUS
  Administered 2014-02-19: 4 mg via INTRAVENOUS
  Administered 2014-02-19 (×2): 1 mg via INTRAVENOUS
  Administered 2014-02-19: 15:00:00 via INTRAVENOUS
  Administered 2014-02-20: 1 mg via INTRAVENOUS
  Administered 2014-02-20: 3 mg via INTRAVENOUS
  Administered 2014-02-20: 1 mg via INTRAVENOUS
  Administered 2014-02-20: 2 mg via INTRAVENOUS
  Administered 2014-02-21: 3 mg via INTRAVENOUS
  Filled 2014-02-15 (×4): qty 25

## 2014-02-15 MED ORDER — OXYCODONE HCL 5 MG/5ML PO SOLN: 5.0000 mg | Freq: Once | ORAL | Status: AC | PRN

## 2014-02-15 MED ORDER — MEPERIDINE HCL 25 MG/ML IJ SOLN
6.2500 mg | INTRAMUSCULAR | Status: DC | PRN
Start: 2014-02-15 — End: 2014-02-15

## 2014-02-15 MED ORDER — SODIUM CHLORIDE 0.9 % IV SOLN
INTRAVENOUS | Status: DC
Start: 2014-02-15 — End: 2014-02-17
  Administered 2014-02-15 – 2014-02-16 (×2): via INTRAVENOUS

## 2014-02-15 MED ORDER — GLYCOPYRROLATE 0.2 MG/ML IJ SOLN
INTRAMUSCULAR | Status: DC | PRN
  Administered 2014-02-15: 0.6 mg via INTRAVENOUS

## 2014-02-15 MED ORDER — BUPIVACAINE HCL (PF) 0.25 % IJ SOLN
INTRAMUSCULAR | Status: DC | PRN
  Administered 2014-02-15: 30 mL

## 2014-02-15 MED ORDER — DIPHENHYDRAMINE HCL 12.5 MG/5ML PO ELIX: 12.5000 mg | ORAL_SOLUTION | Freq: Four times a day (QID) | ORAL | Status: DC | PRN

## 2014-02-15 SURGICAL SUPPLY — 64 items
APPLIER CLIP 5 13 M/L LIGAMAX5 (MISCELLANEOUS)
APR CLP MED LRG 5 ANG JAW (MISCELLANEOUS)
BANDAGE ADH SHEER 1  50/CT (GAUZE/BANDAGES/DRESSINGS) ×8 IMPLANT
BLADE SURG ROTATE 9660 (MISCELLANEOUS) IMPLANT
CANISTER SUCTION 2500CC (MISCELLANEOUS) ×3 IMPLANT
CELLS DAT CNTRL 66122 CELL SVR (MISCELLANEOUS) IMPLANT
CHLORAPREP W/TINT 26ML (MISCELLANEOUS) ×3 IMPLANT
CLIP APPLIE 5 13 M/L LIGAMAX5 (MISCELLANEOUS) IMPLANT
COVER SURGICAL LIGHT HANDLE (MISCELLANEOUS) ×3 IMPLANT
DRAPE WARM FLUID 44X44 (DRAPE) ×3 IMPLANT
DRSG OPSITE POSTOP 4X6 (GAUZE/BANDAGES/DRESSINGS) ×2 IMPLANT
ELECT BLADE 6.5 EXT (BLADE) IMPLANT
ELECT CAUTERY BLADE 6.4 (BLADE) ×6 IMPLANT
ELECT REM PT RETURN 9FT ADLT (ELECTROSURGICAL) ×3
ELECTRODE REM PT RTRN 9FT ADLT (ELECTROSURGICAL) ×1 IMPLANT
GLOVE BIO SURGEON STRL SZ7 (GLOVE) ×6 IMPLANT
GLOVE BIOGEL PI IND STRL 7.5 (GLOVE) ×1 IMPLANT
GLOVE BIOGEL PI INDICATOR 7.5 (GLOVE) ×2
GOWN STRL REUS W/ TWL LRG LVL3 (GOWN DISPOSABLE) ×4 IMPLANT
GOWN STRL REUS W/TWL LRG LVL3 (GOWN DISPOSABLE) ×12
KIT BASIN OR (CUSTOM PROCEDURE TRAY) ×3 IMPLANT
KIT ROOM TURNOVER OR (KITS) ×3 IMPLANT
LIGASURE IMPACT 36 18CM CVD LR (INSTRUMENTS) ×2 IMPLANT
NS IRRIG 1000ML POUR BTL (IV SOLUTION) ×6 IMPLANT
PAD ARMBOARD 7.5X6 YLW CONV (MISCELLANEOUS) ×6 IMPLANT
PENCIL BUTTON HOLSTER BLD 10FT (ELECTRODE) ×3 IMPLANT
RELOAD PROXIMATE 75MM BLUE (ENDOMECHANICALS) ×6 IMPLANT
RELOAD STAPLE 75 3.8 BLU REG (ENDOMECHANICALS) IMPLANT
RETRACTOR WND ALEXIS 18 MED (MISCELLANEOUS) IMPLANT
RTRCTR WOUND ALEXIS 18CM MED (MISCELLANEOUS)
SCALPEL HARMONIC ACE (MISCELLANEOUS) IMPLANT
SCISSORS LAP 5X35 DISP (ENDOMECHANICALS) IMPLANT
SET IRRIG TUBING LAPAROSCOPIC (IRRIGATION / IRRIGATOR) IMPLANT
SLEEVE ENDOPATH XCEL 5M (ENDOMECHANICALS) ×7 IMPLANT
SPECIMEN JAR LARGE (MISCELLANEOUS) ×3 IMPLANT
SPONGE GAUZE 4X4 12PLY (GAUZE/BANDAGES/DRESSINGS) IMPLANT
SPONGE LAP 18X18 X RAY DECT (DISPOSABLE) ×2 IMPLANT
STAPLER GUN LINEAR PROX 60 (STAPLE) ×2 IMPLANT
STAPLER PROXIMATE 75MM BLUE (STAPLE) ×2 IMPLANT
STAPLER VISISTAT 35W (STAPLE) ×3 IMPLANT
SUCTION POOLE TIP (SUCTIONS) ×3 IMPLANT
SUT PDS AB 1 TP1 54 (SUTURE) IMPLANT
SUT PDS AB 1 TP1 96 (SUTURE) IMPLANT
SUT SILK 2 0 (SUTURE) ×3
SUT SILK 2 0 SH CR/8 (SUTURE) ×3 IMPLANT
SUT SILK 2-0 18XBRD TIE 12 (SUTURE) ×1 IMPLANT
SUT SILK 3 0 (SUTURE) ×3
SUT SILK 3 0 SH CR/8 (SUTURE) ×3 IMPLANT
SUT SILK 3-0 18XBRD TIE 12 (SUTURE) ×1 IMPLANT
SUT VIC AB 3-0 SH 8-18 (SUTURE) IMPLANT
SYS LAPSCP GELPORT 120MM (MISCELLANEOUS)
SYSTEM LAPSCP GELPORT 120MM (MISCELLANEOUS) IMPLANT
TOWEL OR 17X24 6PK STRL BLUE (TOWEL DISPOSABLE) ×3 IMPLANT
TOWEL OR 17X26 10 PK STRL BLUE (TOWEL DISPOSABLE) ×3 IMPLANT
TRAY FOLEY CATH 14FRSI W/METER (CATHETERS) ×3 IMPLANT
TRAY LAPAROSCOPIC (CUSTOM PROCEDURE TRAY) ×3 IMPLANT
TROCAR XCEL BLUNT TIP 100MML (ENDOMECHANICALS) IMPLANT
TROCAR XCEL NON-BLD 11X100MML (ENDOMECHANICALS) ×1 IMPLANT
TROCAR XCEL NON-BLD 5MMX100MML (ENDOMECHANICALS) ×3 IMPLANT
TUBE CONNECTING 12'X1/4 (SUCTIONS) ×1
TUBE CONNECTING 12X1/4 (SUCTIONS) ×2 IMPLANT
TUBING FILTER THERMOFLATOR (ELECTROSURGICAL) ×3 IMPLANT
WATER STERILE IRR 1000ML POUR (IV SOLUTION) IMPLANT
YANKAUER SUCT BULB TIP NO VENT (SUCTIONS) ×6 IMPLANT

## 2014-02-15 NOTE — Progress Notes (Signed)
[facility] Progress Note   [name] WSF:[id number] DOB: 11-May-1934 DOA: 02/11/2014 PCP: Wyatt Haste, MD  Brief narrative: [name] is a 80 y.o. male with h/o DM, HTN, pedal edema (lymphedema?), obesity and poor compliance with seeing his PCP and taking his medications. He was admitted in March of this year for "CHF" and noted to be very non-compliant with medications. He comes in to the ER being brought in by "PTAR"? for feeling like he was about to pass out. He had heme positive stools in the ER with severe anemia.  Per the records in [facility] it appears that his PCP ordered home health for him but he kicked them out of his home (see note from [name] on 5/29- telephone conversation) and so he was dismissed by them.  Subjective: Evaluated after surgery - quite sleepy - no complaints.   Assessment/Plan: Principal Problem:   Acute GI bleeding - has received 2 U of blood- last bloody stool occurred before admission per patient-  - consulted  DR Collene Mares who performed colonoscopy on 6/20 which revealed a cecal mass (which was seen 2 yrs ago as wel) with and attached clot - 2013- EGD- LA grade A esophagitis and mild gastritis- Colonoscopy- large mass in cecum which was biopsied (adenoma) and multiple smaller benign adenomatous polyps removed.  - will keep on Protonix for now to prevent a stress ulcer can d/c prior to discharge  Active Problems: Cecal mass -  hemicolectomy performed today- will need to f/u on path report  Anemia due to acute blood loss superimposed on AOCD  - baseline appears to be around 10 dropping down to 6.4 on this admission and improved by 2g after 2 UPRBC -  anemia panel in 3/15 consistent with AOCD  - repeat anemia panel consistent with AOCD  -  Hgb stable around 8.4 today   Hypertensive urgency - resumed Coreg and Lisinopril- did not have his meds at home - added HCTZ - he is afraid of his BP getting too low therefore I have placed  holding parameters on meds for SBP < 155 - PRN hydralazine only if SBP > 180 or DBP > 100  Lymphedema  -ECHO in 0174 revealed systolic CHF but  per last ECHO on 04/4495, no systolic failure - mild LVF but no mention of diastolic dysfunction - note made of right atrium being mild-mod dilated (suggestive of right heart failure?) - no pulm edema on admission this time- no hypoxia -has been treated in the past with ACE wraps- avoid diuretics unless edema is causing symptoms    Diabetes mellitus - last A1c in 3/15- 7.9- keep on sliding scale for now and transition to orals upon d/c- he was sent home with Glipizide 2.5 mg last time- no use in repeating A1c as he has been non-compliant    Obesity - doubt he will be interested in losing weight    CKD (chronic kidney disease), stage III - Cr on last d/c was 2.21 and on this admission 1.55     Noncompliance with medications and f/u with PCP- has kicked out home health nurses   Code Status: Full code Family Communication: none Disposition Plan: to be determined  Consultants: GI  Procedures: none  Antibiotics: Anti-infectives   Start     Dose/Rate Route Frequency Ordered Stop   02/15/14 1400  cefoTEtan (CEFOTAN) 2 g in dextrose 5 % 50 mL IVPB     2 g 100 mL/hr over 30 Minutes Intravenous 3 times per day  02/15/14 1225 02/16/14 1359   02/15/14 0945  cefoTEtan (CEFOTAN) 2 g in dextrose 5 % 50 mL IVPB  Status:  Discontinued     2 g 100 mL/hr over 30 Minutes Intravenous To Surgery 02/15/14 0939 02/15/14 1215   02/15/14 0700  cefoTEtan (CEFOTAN) 2 g in dextrose 5 % 50 mL IVPB  Status:  Discontinued     2 g 100 mL/hr over 30 Minutes Intravenous  Once 02/15/14 0655 02/15/14 1005   02/15/14 0600  [MAR Hold]  cefOXitin (MEFOXIN) 2 g in dextrose 5 % 50 mL IVPB  Status:  Discontinued     (On MAR Hold since 02/15/14 0621)  Comments:  Pharmacy may adjust dosing strength, interval, or rate of medication as needed for optimal therapy for the  patient Send with patient on call to the OR.  Anesthesia to complete antibiotic administration <75min prior to incision per [facility].   2 g 100 mL/hr over 30 Minutes Intravenous On call to O.R. 02/14/14 1207 02/15/14 0655       DVT prophylaxis: SCDs  Objective: Filed Weights   02/14/14 0600 02/14/14 2020 02/15/14 0526  Weight: 108.1 kg (238 lb 5.1 oz) 108.8 kg (239 lb 13.8 oz) 108.8 kg (239 lb 13.8 oz)    Vitals Filed Vitals:   02/15/14 1130 02/15/14 1137 02/15/14 1230 02/15/14 1626  BP:  217/88 208/80   Pulse: 64 69 70   Temp:  97.9 F (36.6 C) 97.6 F (36.4 C)   TempSrc:   Oral   Resp: 10 16 12 9   Height:      Weight:      SpO2: 100% 100% 99% 97%     Intake/Output Summary (Last 24 hours) at 02/15/14 1707 Last data filed at 02/15/14 1500  Gross per 24 hour  Intake   2800 ml  Output   1530 ml  Net   1270 ml     Exam: General: No acute respiratory distress, AAO x 3 Lungs: Clear to auscultation bilaterally without wheezes or crackles Cardiovascular: Regular rate and rhythm without murmur gallop or rub normal S1 and S2 Abdomen:dressing noted, soft, bowel sounds negative Extremities: No significant cyanosis, clubbing, +3 pitting edema bilateral lower extremities  Data Reviewed: Basic Metabolic Panel:  Recent Labs Lab 02/11/14 2256 02/11/14 2306 02/12/14 0956 02/13/14 0215 02/14/14 0225 02/15/14 0500  NA 142 140  --  140 137 139  K 4.2 3.9  --  4.5 3.8 3.8  CL 104 107  --  101 102 102  CO2 22  --   --  25 26 26   GLUCOSE 171* 169*  --  140* 130* 131*  BUN 22 21  --  20 15 12   CREATININE 1.76* 1.80*  --  1.58* 1.48* 1.47*  CALCIUM 8.6  --   --  8.8 8.1* 8.4  MG  --   --  1.6  --   --   --    Liver Function Tests:  Recent Labs Lab 02/11/14 2256  AST 8  ALT <5  ALKPHOS 57  BILITOT <0.2*  PROT 6.5  ALBUMIN 3.0*   No results found for this basename: LIPASE, AMYLASE,  in the last 168 hours No results found for this basename: AMMONIA,  in the  last 168 hours CBC:  Recent Labs Lab 02/11/14 2256  02/12/14 1555 02/13/14 0215 02/14/14 0225 02/14/14 0845 02/15/14 0500  WBC 5.4  --  5.5 6.1 4.8  --  4.1  HGB 6.4*  < > 8.4*  9.5* 7.9* 8.4* 8.6*  HCT 20.8*  < > 26.4* 29.9* 25.0* 26.7* 26.9*  MCV 79.4  --  79.3 79.9 80.1  --  80.1  PLT 216  --  220 260 220  --  231  < > = values in this interval not displayed. Cardiac Enzymes: No results found for this basename: CKTOTAL, CKMB, CKMBINDEX, TROPONINI,  in the last 168 hours BNP (last 3 results)  Recent Labs  11/12/13 1455 12/08/13 1502 02/11/14 2256  PROBNP 2435.0* 1417.0* 2409.0*   CBG:  Recent Labs Lab 02/14/14 1651 02/14/14 2136 02/15/14 0528 02/15/14 1010 02/15/14 1246  GLUCAP 159* 186* 130* 178* 186*    Recent Results (from the past 240 hour(s))  MRSA PCR SCREENING     Status: None   Collection Time    02/12/14  3:07 AM      Result Value Ref Range Status   MRSA by PCR NEGATIVE  NEGATIVE Final   Comment:            The GeneXpert MRSA Assay (FDA     approved for NASAL specimens     only), is one component of a     comprehensive MRSA colonization     surveillance program. It is not     intended to diagnose MRSA     infection nor to guide or     monitor treatment for     MRSA infections.  SURGICAL PCR SCREEN     Status: None   Collection Time    02/15/14  5:11 AM      Result Value Ref Range Status   MRSA, PCR NEGATIVE  NEGATIVE Final   Staphylococcus aureus NEGATIVE  NEGATIVE Final   Comment:            The Xpert SA Assay (FDA     approved for NASAL specimens     in patients over 46 years of age),     is one component of     a comprehensive surveillance     program.  Test performance has     been validated by [employer] for patients greater     than or equal to 78 year old.     It is not intended     to diagnose infection nor to     guide or monitor treatment.     Studies:  Recent x-ray studies have been reviewed in detail by the  Attending Physician  Scheduled Meds:  Scheduled Meds: . alvimopan  12 mg Oral BID  . carvedilol  6.25 mg Oral BID WC  . cefoTEtan (CEFOTAN) IV  2 g Intravenous 3 times per day  . hydrochlorothiazide  25 mg Oral Daily  . HYDROmorphone      . insulin aspart  0-9 Units Subcutaneous 6 times per day  . lisinopril  20 mg Oral Daily  . morphine   Intravenous 6 times per day  . morphine      . oxyCODONE      . promethazine       Continuous Infusions:    Time spent on care of this patient: 35 min   Cole, MD 02/15/2014, 5:07 PM  LOS: 4 days   [facility] Office  [phone] Pager - Text Page per Shea Evans   If 7PM-7AM, please contact night-coverage [email or web address]

## 2014-02-15 NOTE — Transfer of Care (Signed)
Immediate Anesthesia Transfer of Care Note  Patient: [name]  Procedure(s) Performed: Procedure(s): LAPAROSCOPIC ASISSTED RIGHT  COLECTOMY, POSSIBLE OPEN (Right)  Patient Location: PACU  Anesthesia Type:General  Level of Consciousness: awake  Airway & Oxygen Therapy: Patient Spontanous Breathing and Patient connected to face mask oxygen  Post-op Assessment: Report given to PACU RN, Post -op Vital signs reviewed and stable and Patient moving all extremities X 4  Post vital signs: Reviewed and stable  Complications: No apparent anesthesia complications

## 2014-02-15 NOTE — Progress Notes (Signed)
Care of pt assumed by MA Mina Babula RN 

## 2014-02-15 NOTE — Progress Notes (Signed)
PT Cancellation Note  Patient Details Name: [name] MRN: [id number] DOB: October 03, 1933   Cancelled Treatment:    Reason Eval/Treat Not Completed: Patient at procedure or test/unavailable; patient just s/p colectomy for bleeding cecal mass.  Will check on patient tomorrow.   WYNN,CYNDI 02/15/2014, 11:01 AM

## 2014-02-15 NOTE — Anesthesia Procedure Notes (Signed)
Procedure Name: Intubation Date/Time: 02/15/2014 7:38 AM Performed by: Kyung Rudd Pre-anesthesia Checklist: Patient identified, Emergency Drugs available, Suction available, Patient being monitored and Timeout performed Patient Re-evaluated:Patient Re-evaluated prior to inductionOxygen Delivery Method: Circle system utilized Preoxygenation: Pre-oxygenation with 100% oxygen Intubation Type: IV induction Ventilation: Mask ventilation without difficulty and Oral airway inserted - appropriate to patient size Laryngoscope Size: Mac and 4 Grade View: Grade I Tube type: Oral Tube size: 7.5 mm Number of attempts: 1 Airway Equipment and Method: Stylet Placement Confirmation: ETT inserted through vocal cords under direct vision,  positive ETCO2 and breath sounds checked- equal and bilateral Secured at: 22 cm Tube secured with: Tape Dental Injury: Teeth and Oropharynx as per pre-operative assessment

## 2014-02-15 NOTE — Op Note (Signed)
Preoperative diagnosis: bleeding cecal mass Postoperative mass: same as above Procedure: laparoscopic assisted right colectomy Surgeon: Dr Serita Grammes Assistant: Dr Greer Pickerel Anesthesia: general EBL: minimal Specimens right colon and ileum to pathology Complications: None Drains: None Sponge and needle count was correct at completion Condition to recovery stable  Indications: This is a 80 year old male who was at a long-standing right colon polyp. He had an episode of bleeding that was associated with this. He has undergone a colonoscopy with the presence of a cecal mass. I discussed with him a laparoscopic assisted right colectomy.  Procedure: The patient had undergone a bowel prep with his colonoscopy. After informed consent was obtained he was taken to the operating room. He received antibiotics. Sequential compression devices were on his legs. He underwent general anesthesia without complication. An orogastric tube and Foley catheter were placed. His abdomen was prepped and draped in the standard sterile surgical fashion. A surgical timeout was performed.  His stomach was evacuated with orogastric tube. I then inserted a 5 mm Optiview trocars left upper quadrant without difficulty. His abdomen was then insufflated to 15 mmHg pressure. I decided to put a hand port in. I made a 7 cm incision above his umbilicus. I then inserted a hand port. I was able to palpate the cecum and the mass was indeed in the cecum. I then proceeded using the hand assist to rotate the right colon medially taking care to avoid the duodenum. This was done with harmonic scalpel. Once this was medialized I then exteriorized this through the wound protector. I then divided the terminal ileum with the GIA stapler. I also divided the transverse colon as well. A combination of the LigaSure device and silk ligatures were then used to divide the mesentery. This was passed off the table as a specimen. I then used 2-0 silk to  approximate the terminal ileum to the transverse colon. I then created an anastomosis using a GIA stapler. This was hemostatic. I then placed 2 silk crotch stitches. I then closed the common enterotomy with a TA stapler. This was then oversewn with silk sutures. I closed the mesenteric defect with silk sutures as well. This was then placed back in the abdomen and the omentum was placed over the anastomosis. We then proceeded to change our gowns and gloves per the colon protocol. I then closed the incision after irrigating with #1 looped PDS. I then stapled the incision closed. I then looked back in with the laparoscope and evacuated  fluid there was no evidence of any bleeding everything looked well. I then removed the laparoscope and desufflated the abdomen. I then closed the remainder of the incisions with staples. He was extubated and transferred to recovery stable.

## 2014-02-15 NOTE — Anesthesia Preprocedure Evaluation (Addendum)
Anesthesia Evaluation  Patient identified by MRN, date of birth, ID band Patient awake    Reviewed: Allergy & Precautions, H&P , NPO status , Patient's Chart, lab work & pertinent test results, reviewed documented beta blocker date and time   History of Anesthesia Complications Negative for: history of anesthetic complications  Airway Mallampati: I TM Distance: >3 FB Neck ROM: Full    Dental  (+) Edentulous Lower, Edentulous Upper   Pulmonary former smoker (quit 2000),  breath sounds clear to auscultation  Pulmonary exam normal       Cardiovascular hypertension, Pt. on medications and Pt. on home beta blockers - anginaRhythm:Regular Rate:Normal  3/15 ECHO: EF 60-65%, valves OK   Neuro/Psych negative neurological ROS     GI/Hepatic Neg liver ROS, H/o GI bleed: 2u PRBC 02/11/14 Colon cancer   Endo/Other  diabetes (glu 130), Poorly Controlled, Oral Hypoglycemic AgentsMorbid obesity  Renal/GU Renal InsufficiencyRenal disease (creat 1.47)     Musculoskeletal   Abdominal (+) + obese,   Peds  Hematology  (+) Blood dyscrasia (Hb 8.6), anemia ,   Anesthesia Other Findings   Reproductive/Obstetrics                        Anesthesia Physical Anesthesia Plan  ASA: III  Anesthesia Plan: General   Post-op Pain Management:    Induction: Intravenous  Airway Management Planned: Oral ETT  Additional Equipment:   Intra-op Plan:   Post-operative Plan: Extubation in OR  Informed Consent: I have reviewed the patients History and Physical, chart, labs and discussed the procedure including the risks, benefits and alternatives for the proposed anesthesia with the patient or authorized representative who has indicated his/her understanding and acceptance.   Dental advisory given  Plan Discussed with: Anesthesiologist, CRNA and Surgeon  Anesthesia Plan Comments: (Plan routine monitors, GETA)        Anesthesia Quick Evaluation

## 2014-02-15 NOTE — Anesthesia Postprocedure Evaluation (Signed)
  Anesthesia Post-op Note  Patient: [name]  Procedure(s) Performed: Procedure(s): LAPAROSCOPIC ASISSTED RIGHT  COLECTOMY, POSSIBLE OPEN (Right)  Patient Location: PACU  Anesthesia Type:General  Level of Consciousness: awake, alert , oriented and patient cooperative  Airway and Oxygen Therapy: Patient Spontanous Breathing and Patient connected to nasal cannula oxygen  Post-op Pain: none  Post-op Assessment: Post-op Vital signs reviewed, Patient's Cardiovascular Status Stable, Respiratory Function Stable, Patent Airway, No signs of Nausea or vomiting and Pain level controlled  Post-op Vital Signs: Reviewed and stable  Last Vitals:  Filed Vitals:   02/15/14 1100  BP:   Pulse: 60  Temp:   Resp: 16    Complications: No apparent anesthesia complications

## 2014-02-16 ENCOUNTER — Telehealth: Payer: Self-pay | Admitting: Hematology and Oncology

## 2014-02-16 DIAGNOSIS — N189 Chronic kidney disease, unspecified: Secondary | ICD-10-CM

## 2014-02-16 DIAGNOSIS — C189 Malignant neoplasm of colon, unspecified: Secondary | ICD-10-CM

## 2014-02-16 DIAGNOSIS — N039 Chronic nephritic syndrome with unspecified morphologic changes: Secondary | ICD-10-CM

## 2014-02-16 DIAGNOSIS — D631 Anemia in chronic kidney disease: Secondary | ICD-10-CM

## 2014-02-16 LAB — GLUCOSE, CAPILLARY
GLUCOSE-CAPILLARY: 138 mg/dL — AB (ref 70–99)
GLUCOSE-CAPILLARY: 143 mg/dL — AB (ref 70–99)
GLUCOSE-CAPILLARY: 165 mg/dL — AB (ref 70–99)
GLUCOSE-CAPILLARY: 187 mg/dL — AB (ref 70–99)
Glucose-Capillary: 160 mg/dL — ABNORMAL HIGH (ref 70–99)

## 2014-02-16 LAB — CBC
HCT: 22.1 % — ABNORMAL LOW (ref 39.0–52.0)
HCT: 22.2 % — ABNORMAL LOW (ref 39.0–52.0)
HEMOGLOBIN: 7.1 g/dL — AB (ref 13.0–17.0)
Hemoglobin: 7 g/dL — ABNORMAL LOW (ref 13.0–17.0)
MCH: 26 pg (ref 26.0–34.0)
MCH: 26.1 pg (ref 26.0–34.0)
MCHC: 31.7 g/dL (ref 30.0–36.0)
MCHC: 32 g/dL (ref 30.0–36.0)
MCV: 82.2 fL (ref 78.0–100.0)
MCV: 81.6 fL (ref 78.0–100.0)
Platelets: 262 10*3/uL (ref 150–400)
Platelets: 225 10*3/uL (ref 150–400)
RBC: 2.69 MIL/uL — AB (ref 4.22–5.81)
RBC: 2.72 MIL/uL — ABNORMAL LOW (ref 4.22–5.81)
RDW: 16.2 % — ABNORMAL HIGH (ref 11.5–15.5)
RDW: 16.4 % — AB (ref 11.5–15.5)
WBC: 15.7 10*3/uL — AB (ref 4.0–10.5)
WBC: 14.1 10*3/uL — ABNORMAL HIGH (ref 4.0–10.5)

## 2014-02-16 LAB — BASIC METABOLIC PANEL
BUN: 17 mg/dL (ref 6–23)
CO2: 22 meq/L (ref 19–32)
Calcium: 7.9 mg/dL — ABNORMAL LOW (ref 8.4–10.5)
Chloride: 101 mEq/L (ref 96–112)
Creatinine, Ser: 2.05 mg/dL — ABNORMAL HIGH (ref 0.50–1.35)
GFR calc Af Amer: 34 mL/min — ABNORMAL LOW (ref >90)
GFR, EST NON AFRICAN AMERICAN: 29 mL/min — AB (ref >90)
Glucose, Bld: 162 mg/dL — ABNORMAL HIGH (ref 70–99)
Potassium: 4.5 mEq/L (ref 3.7–5.3)
SODIUM: 140 meq/L (ref 137–147)

## 2014-02-16 LAB — PREPARE RBC (CROSSMATCH)

## 2014-02-16 MED ORDER — PANTOPRAZOLE SODIUM 40 MG IV SOLR
40.0000 mg | INTRAVENOUS | Status: DC
  Administered 2014-02-16 – 2014-02-24 (×9): 40 mg via INTRAVENOUS
  Filled 2014-02-16 (×14): qty 40

## 2014-02-16 MED ORDER — ENOXAPARIN SODIUM 40 MG/0.4ML ~~LOC~~ SOLN
40.0000 mg | SUBCUTANEOUS | Status: DC
  Administered 2014-02-16 – 2014-02-17 (×2): 40 mg via SUBCUTANEOUS
  Filled 2014-02-16 (×3): qty 0.4

## 2014-02-16 NOTE — Progress Notes (Signed)
Text paged Tylene Fantasia to notify her that patient refused insulin, 0400 CBG, am IV antibiotics, and am vital signs.

## 2014-02-16 NOTE — Progress Notes (Signed)
1 Day Post-Op  Subjective: Not happy he cant eat, some bilious emesis in front of him, pain controlled  Objective: Vital signs in last 24 hours: Temp:  [97.6 F (36.4 C)-98.5 F (36.9 C)] 98.1 F (36.7 C) (06/24 0135) Pulse Rate:  [60-82] 81 (06/24 0135) Resp:  [9-21] 19 (06/24 0608) BP: (106-229)/(47-95) 116/47 mmHg (06/24 0135) SpO2:  [97 %-100 %] 100 % (06/24 1657) Weight:  [239 lb 6.7 oz (108.6 kg)] 239 lb 6.7 oz (108.6 kg) (06/24 0608) Last BM Date: 02/14/14  Intake/Output from previous day: 06/23 0701 - 06/24 0700 In: 3083 [I.V.:2083] Out: 1655 [Urine:1575; Emesis/NG output:30; Blood:50] Intake/Output this shift:    General appearance: no distress GI: wound dressed few bs, approp tender  Lab Results:   Recent Labs  02/15/14 0500 02/16/14 0415  WBC 4.1 15.7*  HGB 8.6* 7.0*  HCT 26.9* 22.1*  PLT 231 262   BMET  Recent Labs  02/15/14 0500 02/16/14 0415  NA 139 140  K 3.8 4.5  CL 102 101  CO2 26 22  GLUCOSE 131* 162*  BUN 12 17  CREATININE 1.47* 2.05*  CALCIUM 8.4 7.9*   PT/INR No results found for this basename: LABPROT, INR,  in the last 72 hours ABG No results found for this basename: PHART, PCO2, PO2, HCO3,  in the last 72 hours  Studies/Results: No results found.  Anti-infectives: Anti-infectives   Start     Dose/Rate Route Frequency Ordered Stop   02/15/14 1400  cefoTEtan (CEFOTAN) 2 g in dextrose 5 % 50 mL IVPB     2 g 100 mL/hr over 30 Minutes Intravenous 3 times per day 02/15/14 1225 02/16/14 1359   02/15/14 0945  cefoTEtan (CEFOTAN) 2 g in dextrose 5 % 50 mL IVPB  Status:  Discontinued     2 g 100 mL/hr over 30 Minutes Intravenous To Surgery 02/15/14 0939 02/15/14 1215   02/15/14 0700  cefoTEtan (CEFOTAN) 2 g in dextrose 5 % 50 mL IVPB  Status:  Discontinued     2 g 100 mL/hr over 30 Minutes Intravenous  Once 02/15/14 0655 02/15/14 1005   02/15/14 0600  [MAR Hold]  cefOXitin (MEFOXIN) 2 g in dextrose 5 % 50 mL IVPB  Status:   Discontinued     (On MAR Hold since 02/15/14 0621)  Comments:  Pharmacy may adjust dosing strength, interval, or rate of medication as needed for optimal therapy for the patient Send with patient on call to the OR.  Anesthesia to complete antibiotic administration <54min prior to incision per [facility].   2 g 100 mL/hr over 30 Minutes Intravenous On call to O.R. 02/14/14 1207 02/15/14 9038      Assessment/Plan: POD 1 lap right colon  1. Continue pca until ileus resolves 2. pulm toilet 3. Ice chips only, await ileus resolution 4. Path pending 5. Dc foley today 6. lovenox protonix  [name],[name] 02/16/2014

## 2014-02-16 NOTE — Evaluation (Signed)
Physical Therapy Evaluation Patient Details Name: [name] MRN: [id number] DOB: 1934-01-10 Today's Date: 02/16/2014   History of Present Illness  Pt is a 80 yo male admitted with GI bleed. Pt s/p laparoscopic R colectomy on 6/23. Pt with long cardiac history.  Clinical Impression  Pt is adamant regarding not returning to daughter however has no other place to reside. Pt currently deconditioned requiring assist for all ADLs and mobility. Pt to benefit from ST-SNF to achieve safe mod I function.     Follow Up Recommendations SNF;Supervision/Assistance - 24 hour    Equipment Recommendations  None recommended by PT    Recommendations for Other Services       Precautions / Restrictions Precautions Precautions: Fall Precaution Comments: pt easily agitated regarding family situation Restrictions Weight Bearing Restrictions: No      Mobility  Bed Mobility Overal bed mobility:  (pt up in chair upon PT arrival)                Transfers Overall transfer level: Needs assistance Equipment used: Rolling walker (2 wheeled) Transfers: Sit to/from Stand Sit to Stand: Min guard         General transfer comment: v/c's for safe hand placement  Ambulation/Gait Ambulation/Gait assistance: Min guard Ambulation Distance (Feet): 45 Feet Assistive device: Rolling walker (2 wheeled) Gait Pattern/deviations: Step-through pattern;Decreased stride length Gait velocity: decreased but appropriate   General Gait Details: pt kept repeating "I'm not ready for this today"  Stairs            Wheelchair Mobility    Modified Rankin (Stroke Patients Only)       Balance Overall balance assessment:  (currently requires RW for safe standing/amb)                                           Pertinent Vitals/Pain Reports of surgical pain but did not rate. Pushed PCA button x 1.    Home Living Family/patient expects to be discharged to:: Skilled nursing  facility                 Additional Comments: pt was living with daughter b/c "i'm homeless" however strongly states he is not returning home with her    Prior Function Level of Independence: Needs assistance   Gait / Transfers Assistance Needed: use RW for long distance amb  ADL's / Homemaking Assistance Needed: reports indep        Hand Dominance   Dominant Hand: Right    Extremity/Trunk Assessment   Upper Extremity Assessment: Overall WFL for tasks assessed           Lower Extremity Assessment: Generalized weakness (due to edema and abdominal pain)      Cervical / Trunk Assessment: Normal  Communication   Communication: No difficulties  Cognition Arousal/Alertness: Awake/alert Behavior During Therapy: WFL for tasks assessed/performed Overall Cognitive Status: Difficult to assess                      General Comments      Exercises        Assessment/Plan    PT Assessment Patient needs continued PT services  PT Diagnosis Difficulty walking   PT Problem List Decreased strength;Decreased activity tolerance;Decreased balance;Decreased mobility  PT Treatment Interventions DME instruction;Gait training;Functional mobility training;Therapeutic activities;Therapeutic exercise   PT Goals (Current goals can be found in the Care  Plan section) Acute Rehab PT Goals Patient Stated Goal: didn't state PT Goal Formulation: With patient Time For Goal Achievement: 03/02/14 Potential to Achieve Goals: Good    Frequency Min 3X/week   Barriers to discharge Decreased caregiver support pt not willing to go back to daughters house    Co-evaluation               End of Session Equipment Utilized During Treatment: Gait belt Activity Tolerance: Patient limited by fatigue Patient left: in chair;with call bell/phone within reach Nurse Communication: Mobility status         Time: 1127-1150 PT Time Calculation (min): 23 min   Charges:   PT  Evaluation $Initial PT Evaluation Tier I: 1 Procedure PT Treatments $Gait Training: 8-22 mins   PT G CodesKingsley Callander 02/16/2014, 2:24 PM  Kittie Plater, PT, DPT Pager #: [phone] Office #: [phone]

## 2014-02-16 NOTE — Progress Notes (Signed)
Noted in the chart that patient has refused insulin several times and Alba Cory, RN documented patient states "we were trying to make him dependent on the insulin". Talked with the patient about diabetes and importance of glycemic control especially post-op.  Patient reports that he has not taken any diabetes medication in over 3 months when he ran out of medications and his PCP would not refill his prescriptions. He states that his "doctor is trying to kill him" and he is in the process of finding a new PCP. According to the patient he did not have diabetes until "his wife and doctor talked him into taking diabetes medication". Inquired about knowledge about A1C and patient reports that he does not know what an A1C is. Discussed A1C results (7.0% on6/20/15) and explained what an A1C is and how it is used for diabetes diagnosis and to determine how diabetes is being controlled over a 2-3 month time frame. Discussed basic pathophysiology of DM Type 2, basic home care, importance of checking CBGs and maintaining good CBG control to prevent long-term and short-term complications. Discussed impact of nutrition, exercise, stress, sickness, and medications on diabetes control. In talking with the patient he reports that he was living with his daughter in an apartment with dogs, fleas, and filth and that he has nowhere to go at time of discharge. Patient requesting help in finding a PCP and a place for him to live at time of discharge. Patient also admits that he feels depressed and hopeless at times due to his situation and health. Patient states that he has 2 daughters and they do not provide him with any help and he feels they only want his money. Patient scored 17 on PHQ-9. Provided emotional support and allowed patient to discuss his frustrations and feelings.  Informed patient of consult for SW to talk with him regarding PHQ-9 score and patient is agreeable. Also informed patient of CM consult for assistance with  finding a PCP and finding a place for patient to discharge to. Patient verbalized understanding of information discussed and she states that she has no further questions at this time related to diabetes.   Thanks, Barnie Alderman, RN, MSN, CCRN Diabetes Coordinator Inpatient Diabetes Program [phone] (Team Pager) [phone] (AP office) [phone] [facility] office)

## 2014-02-16 NOTE — Progress Notes (Signed)
Patient refused 0400 CBG;said he didn't need it.

## 2014-02-16 NOTE — Progress Notes (Signed)
Patient demanding water to drink.  Gave him some ice chips and attempted to explain to him that he does not have a diet order and that frequently after abdominal surgery, patients are not allowed to eat or drink until their bowels start functioning again-he will have to walk and pass gas.  Patient became angry and said "ya'll are trying to do the same thing to me you did the last time I was in here!  You want give me any water and I'm going to end up not being able to walk."  I offered to get him up to walk and he seemed agreeable, but when I turned off his SCD's to get him up, he became angry again and said he did not want to walk.  Patient has also refused insulin twice this shift.  He said that we were trying to make him dependent on the insulin, but I told him no; we want to control his blood sugar which is important for healing.  I told him that just because he has insulin in the hospital does not mean he will go home on insulin.

## 2014-02-16 NOTE — Telephone Encounter (Signed)
I received an inpatient consult on the patient who was recently diagnosed with colon cancer. I discussed with the hospitalist and felt that the consult can wait and the patient can be seen in the GI oncology specialist clinic at is an outpatient. I forwarded the information to the GI oncology nurse navigator who will arrange for outpatient followup.

## 2014-02-16 NOTE — Progress Notes (Signed)
Patient says he will pull out IV unless he gets water to drink.  Have given some ice chips.

## 2014-02-16 NOTE — Progress Notes (Signed)
[facility] Progress Note   [name] IEP:[id number] DOB: 25-Mar-1934 DOA: 02/11/2014 PCP: Wyatt Haste, MD  Brief narrative: [name] is a 80 y.o. male with h/o DM, HTN, pedal edema (lymphedema?), obesity and poor compliance with seeing his PCP and taking his medications. He was admitted in March of this year for "CHF" and noted to be very non-compliant with medications. He comes in to the ER being brought in by "PTAR"? for feeling like he was about to pass out. He had heme positive stools in the ER with severe anemia.  Per the records in EPIC it appears that his PCP ordered home health for him but he kicked them out of his home (see note from [name] on 5/29- telephone conversation) and so he was dismissed by them.  Subjective: Evaluated after surgery - quite sleepy - no complaints.   Assessment/Plan: Principal Problem:   Acute GI bleeding - has received 2 U of blood- last bloody stool occurred before admission per patient-  - consulted  DR Collene Mares who performed colonoscopy on 6/20 which revealed a cecal mass (which was seen 2 yrs ago as wel) with and attached clot - 2013- EGD- LA grade A esophagitis and mild gastritis- Colonoscopy- large mass in cecum which was biopsied (adenoma) and multiple smaller benign adenomatous polyps removed.  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- will call oncology  Anemia due to acute blood loss superimposed on AOCD  - baseline appears to be around 10- dropped down to 6.4 on admission and improved by 2g after 2 UPRBC -  anemia panel in 3/15 consistent with AOCD  - repeat anemia panel consistent with AOCD  -  Hgb stable dropped again to 7 today - repeat Hgb ordered this AM and still not done- discucssed with RN to investigate     AKI on CKD (chronic kidney disease), stage III - Cr on last  d/c was 2.21 and on this admission 1.55 - hold ACE- may be due to blood loss - repeating Hgb- Hydrate with IVF Hypertensive urgency - resumed Coreg and Lisinopril- did not have his meds at home - BP quite low today- and Cr up- d/c ACE and HCTZ - he is afraid of his BP getting too low therefore I have placed holding parameters on meds for SBP < 155 - PRN hydralazine only if SBP > 180 or DBP > 100  Lymphedema  -ECHO in 6606 revealed systolic CHF but  per last ECHO on 10/158, no systolic failure - mild LVF but no mention of diastolic dysfunction - note made of right atrium being mild-mod dilated (suggestive of right heart failure?) - no pulm edema on admission this time- no hypoxia -has been treated in the past with ACE wraps- avoid diuretics unless edema is causing symptoms    Diabetes mellitus - last A1c in 3/15- 7.9- keep on sliding scale for now and transition to orals upon d/c- he was sent home with Glipizide 2.5 mg last time- no use in repeating A1c as he has been non-compliant    Obesity - doubt he will be interested in losing weight    Noncompliance with medications and f/u with PCP- has kicked out home health nurses   Code Status: Full code Family Communication: none Disposition Plan: to be determined  Consultants: GI  Procedures: none  Antibiotics: Anti-infectives   Start  Dose/Rate Route Frequency Ordered Stop   02/15/14 1400  cefoTEtan (CEFOTAN) 2 g in dextrose 5 % 50 mL IVPB     2 g 100 mL/hr over 30 Minutes Intravenous 3 times per day 02/15/14 1225 02/16/14 1359   02/15/14 0945  cefoTEtan (CEFOTAN) 2 g in dextrose 5 % 50 mL IVPB  Status:  Discontinued     2 g 100 mL/hr over 30 Minutes Intravenous To Surgery 02/15/14 0939 02/15/14 1215   02/15/14 0700  cefoTEtan (CEFOTAN) 2 g in dextrose 5 % 50 mL IVPB  Status:  Discontinued     2 g 100 mL/hr over 30 Minutes Intravenous  Once 02/15/14 0655 02/15/14 1005   02/15/14 0600  [MAR Hold]  cefOXitin (MEFOXIN) 2 g in  dextrose 5 % 50 mL IVPB  Status:  Discontinued     (On MAR Hold since 02/15/14 0621)  Comments:  Pharmacy may adjust dosing strength, interval, or rate of medication as needed for optimal therapy for the patient Send with patient on call to the OR.  Anesthesia to complete antibiotic administration <27min prior to incision per [facility].   2 g 100 mL/hr over 30 Minutes Intravenous On call to O.R. 02/14/14 1207 02/15/14 0655       DVT prophylaxis: SCDs  Objective: Filed Weights   02/14/14 2020 02/15/14 0526 02/16/14 4332  Weight: 108.8 kg (239 lb 13.8 oz) 108.8 kg (239 lb 13.8 oz) 108.6 kg (239 lb 6.7 oz)    Vitals Filed Vitals:   02/16/14 0909 02/16/14 0930 02/16/14 1244 02/16/14 1328  BP:  136/59  124/47  Pulse:  86  80  Temp:  97.7 F (36.5 C)  98.7 F (37.1 C)  TempSrc:  Oral  Oral  Resp: 10 10 16 17   Height:      Weight:      SpO2: 99% 99% 99% 98%     Intake/Output Summary (Last 24 hours) at 02/16/14 1354 Last data filed at 02/16/14 1300  Gross per 24 hour  Intake   1283 ml  Output   1075 ml  Net    208 ml     Exam: General: No acute respiratory distress, AAO x 3 Lungs: Clear to auscultation bilaterally without wheezes or crackles Cardiovascular: Regular rate and rhythm without murmur gallop or rub normal S1 and S2 Abdomen:dressing noted, soft, bowel sounds negative Extremities: No significant cyanosis, clubbing, +3 pitting edema bilateral lower extremities  Data Reviewed: Basic Metabolic Panel:  Recent Labs Lab 02/11/14 2256 02/11/14 2306 02/12/14 0956 02/13/14 0215 02/14/14 0225 02/15/14 0500 02/16/14 0415  NA 142 140  --  140 137 139 140  K 4.2 3.9  --  4.5 3.8 3.8 4.5  CL 104 107  --  101 102 102 101  CO2 22  --   --  25 26 26 22   GLUCOSE 171* 169*  --  140* 130* 131* 162*  BUN 22 21  --  20 15 12 17   CREATININE 1.76* 1.80*  --  1.58* 1.48* 1.47* 2.05*  CALCIUM 8.6  --   --  8.8 8.1* 8.4 7.9*  MG  --   --  1.6  --   --   --   --     Liver Function Tests:  Recent Labs Lab 02/11/14 2256  AST 8  ALT <5  ALKPHOS 57  BILITOT <0.2*  PROT 6.5  ALBUMIN 3.0*   No results found for this basename: LIPASE, AMYLASE,  in the last 168 hours  No results found for this basename: AMMONIA,  in the last 168 hours CBC:  Recent Labs Lab 02/12/14 1555 02/13/14 0215 02/14/14 0225 02/14/14 0845 02/15/14 0500 02/16/14 0415  WBC 5.5 6.1 4.8  --  4.1 15.7*  HGB 8.4* 9.5* 7.9* 8.4* 8.6* 7.0*  HCT 26.4* 29.9* 25.0* 26.7* 26.9* 22.1*  MCV 79.3 79.9 80.1  --  80.1 82.2  PLT 220 260 220  --  231 262   Cardiac Enzymes: No results found for this basename: CKTOTAL, CKMB, CKMBINDEX, TROPONINI,  in the last 168 hours BNP (last 3 results)  Recent Labs  11/12/13 1455 12/08/13 1502 02/11/14 2256  PROBNP 2435.0* 1417.0* 2409.0*   CBG:  Recent Labs Lab 02/15/14 1701 02/15/14 2056 02/15/14 2332 02/16/14 0815 02/16/14 1152  GLUCAP 196* 162* 187* 160* 165*    Recent Results (from the past 240 hour(s))  MRSA PCR SCREENING     Status: None   Collection Time    02/12/14  3:07 AM      Result Value Ref Range Status   MRSA by PCR NEGATIVE  NEGATIVE Final   Comment:            The GeneXpert MRSA Assay (FDA     approved for NASAL specimens     only), is one component of a     comprehensive MRSA colonization     surveillance program. It is not     intended to diagnose MRSA     infection nor to guide or     monitor treatment for     MRSA infections.  SURGICAL PCR SCREEN     Status: None   Collection Time    02/15/14  5:11 AM      Result Value Ref Range Status   MRSA, PCR NEGATIVE  NEGATIVE Final   Staphylococcus aureus NEGATIVE  NEGATIVE Final   Comment:            The Xpert SA Assay (FDA     approved for NASAL specimens     in patients over 64 years of age),     is one component of     a comprehensive surveillance     program.  Test performance has     been validated by [employer] for patients greater      than or equal to 27 year old.     It is not intended     to diagnose infection nor to     guide or monitor treatment.     Studies:  Recent x-ray studies have been reviewed in detail by the Attending Physician  Scheduled Meds:  Scheduled Meds: . alvimopan  12 mg Oral BID  . carvedilol  6.25 mg Oral BID WC  . cefoTEtan (CEFOTAN) IV  2 g Intravenous 3 times per day  . enoxaparin (LOVENOX) injection  40 mg Subcutaneous Q24H  . hydrochlorothiazide  25 mg Oral Daily  . insulin aspart  0-9 Units Subcutaneous 6 times per day  . lisinopril  20 mg Oral Daily  . morphine   Intravenous 6 times per day  . pantoprazole (PROTONIX) IV  40 mg Intravenous Q24H   Continuous Infusions: . sodium chloride 50 mL/hr at 02/16/14 1242    Time spent on care of this patient: 35 min   Markle, MD 02/16/2014, 1:54 PM  LOS: 5 days   [facility] Office  [phone] Pager - Text Page per Shea Evans   If 7PM-7AM, please contact night-coverage  [email or web address]

## 2014-02-16 NOTE — Care Management Note (Signed)
    Page 1 of 1   02/16/2014     2:30:04 PM CARE MANAGEMENT NOTE 02/16/2014  Patient:  [name],[name]   Account Number:  [id number]  Date Initiated:  02/14/2014  Documentation initiated by:  Elissa Hefty  Subjective/Objective Assessment:   adm w gi bleed     Action/Plan:   lives w fam, pcp dr [name] [name]   Anticipated DC Date:     Anticipated DC Plan:           Choice offered to / List presented to:             Status of service:   Medicare Important Message given?   (If response is "NO", the following Medicare IM given date fields will be blank) Date Medicare IM given:   Date Additional Medicare IM given:    Discharge Disposition:    Per UR Regulation:  Reviewed for med. necessity/level of care/duration of stay  If discussed at [place] of Stay Meetings, dates discussed:    Comments:  02-16-14 Consult for PCP and a place to go at discharge. Spoke with patient regarding PCP . Provided community resources, explained to patient that he can call number on Medicare call and be provided a list of MD accepting Medicare, provided [facility] number if he wants a [place] MD , or if patient knows a MD he wants to see he can call that office directly to see if that MD is accepting new patients with Medicare.  Discuss housing . Patient states address on face sheet is his daughters address and he can't go back there because it is infested with dog fleas .  Asked patient if he had another family member or friend he can stay with at discharge , he answered "no". He would like to go to rehab . Explained PT eval has been ordered and we will await recommendations . Patient states if rehab is not an option he has no where to go . He does not want a shelter , he wants a place of his own.  Explained social work will see patient after PT eval .  Magdalen Spatz RN BSN   6/22 1043a debbie dowell rn,bsn will moniter for dc needs as pt progresses.

## 2014-02-17 ENCOUNTER — Encounter (HOSPITAL_COMMUNITY): Payer: Self-pay | Admitting: General Surgery

## 2014-02-17 DIAGNOSIS — Z91199 Patient's noncompliance with other medical treatment and regimen due to unspecified reason: Secondary | ICD-10-CM

## 2014-02-17 DIAGNOSIS — Z9119 Patient's noncompliance with other medical treatment and regimen: Secondary | ICD-10-CM

## 2014-02-17 LAB — BILIRUBIN, FRACTIONATED(TOT/DIR/INDIR)
BILIRUBIN TOTAL: 0.4 mg/dL (ref 0.3–1.2)
Bilirubin, Direct: <0.2 mg/dL (ref 0.0–0.3)

## 2014-02-17 LAB — GLUCOSE, CAPILLARY
GLUCOSE-CAPILLARY: 158 mg/dL — AB (ref 70–99)
GLUCOSE-CAPILLARY: 131 mg/dL — AB (ref 70–99)
Glucose-Capillary: 120 mg/dL — ABNORMAL HIGH (ref 70–99)
Glucose-Capillary: 122 mg/dL — ABNORMAL HIGH (ref 70–99)
Glucose-Capillary: 134 mg/dL — ABNORMAL HIGH (ref 70–99)
Glucose-Capillary: 140 mg/dL — ABNORMAL HIGH (ref 70–99)

## 2014-02-17 LAB — BASIC METABOLIC PANEL
BUN: 19 mg/dL (ref 6–23)
CALCIUM: 7.9 mg/dL — AB (ref 8.4–10.5)
CO2: 23 mEq/L (ref 19–32)
Chloride: 101 mEq/L (ref 96–112)
Creatinine, Ser: 1.94 mg/dL — ABNORMAL HIGH (ref 0.50–1.35)
GFR calc Af Amer: 36 mL/min — ABNORMAL LOW (ref >90)
GFR, EST NON AFRICAN AMERICAN: 31 mL/min — AB (ref >90)
Glucose, Bld: 127 mg/dL — ABNORMAL HIGH (ref 70–99)
Potassium: 4.1 mEq/L (ref 3.7–5.3)
SODIUM: 138 meq/L (ref 137–147)

## 2014-02-17 LAB — CBC
HCT: 20.9 % — ABNORMAL LOW (ref 39.0–52.0)
Hemoglobin: 6.7 g/dL — CL (ref 13.0–17.0)
MCH: 26.3 pg (ref 26.0–34.0)
MCHC: 32.1 g/dL (ref 30.0–36.0)
MCV: 82 fL (ref 78.0–100.0)
PLATELETS: 195 10*3/uL (ref 150–400)
RBC: 2.55 MIL/uL — ABNORMAL LOW (ref 4.22–5.81)
RDW: 16.9 % — ABNORMAL HIGH (ref 11.5–15.5)
WBC: 10.4 10*3/uL (ref 4.0–10.5)

## 2014-02-17 LAB — HEMOGLOBIN AND HEMATOCRIT, BLOOD
HEMATOCRIT: 24.8 % — AB (ref 39.0–52.0)
HEMATOCRIT: 21.7 % — AB (ref 39.0–52.0)
HEMOGLOBIN: 8.2 g/dL — AB (ref 13.0–17.0)
Hemoglobin: 7 g/dL — ABNORMAL LOW (ref 13.0–17.0)

## 2014-02-17 LAB — PREPARE RBC (CROSSMATCH)

## 2014-02-17 LAB — LACTATE DEHYDROGENASE: LDH: 178 U/L (ref 94–250)

## 2014-02-17 LAB — BLOOD PRODUCT ORDER (VERBAL) VERIFICATION

## 2014-02-17 MED ORDER — FUROSEMIDE 10 MG/ML IJ SOLN
20.0000 mg | Freq: Once | INTRAMUSCULAR | Status: AC
Start: 2014-02-17 — End: 2014-02-17
  Administered 2014-02-17: 11:00:00 via INTRAVENOUS

## 2014-02-17 MED ORDER — FUROSEMIDE 10 MG/ML IJ SOLN
INTRAMUSCULAR | Status: AC
  Filled 2014-02-17: qty 4

## 2014-02-17 NOTE — Progress Notes (Signed)
Patient ID: Gradie Ohm, male   DOB: 1934/01/06, 80 y.o.   MRN: [id number] 2 Days Post-Op  Subjective: Pt wants to eat.  Initially says he has some nausea, then denies.  Denies flatus.  Objective: Vital signs in last 24 hours: Temp:  [97.7 F (36.5 C)-99.3 F (37.4 C)] 99.3 F (37.4 C) (06/25 0531) Pulse Rate:  [69-86] 71 (06/25 0531) Resp:  [10-18] 16 (06/25 0531) BP: (123-137)/(46-59) 137/58 mmHg (06/25 0531) SpO2:  [96 %-100 %] 98 % (06/25 0531) Weight:  [235 lb 9.5 oz (106.865 kg)] 235 lb 9.5 oz (106.865 kg) (06/25 0531) Last BM Date: 02/14/14  Intake/Output from previous day: 06/24 0701 - 06/25 0700 In: 470.8 [I.V.:470.8] Out: 550 [Urine:550] Intake/Output this shift:    PE: Abd: soft, appropriately tender, hypoactive BS, incision c/d/i with honeycomb dressing in place.  Lab Results:   Recent Labs  02/16/14 1455 02/16/14 2330 02/17/14 0452  WBC 14.1*  --  10.4  HGB 7.1* 7.0* 6.7*  HCT 22.2* 21.7* 20.9*  PLT 225  --  195   BMET  Recent Labs  02/16/14 0415 02/17/14 0452  NA 140 138  K 4.5 4.1  CL 101 101  CO2 22 23  GLUCOSE 162* 127*  BUN 17 19  CREATININE 2.05* 1.94*  CALCIUM 7.9* 7.9*   PT/INR No results found for this basename: LABPROT, INR,  in the last 72 hours CMP     Component Value Date/Time   NA 138 02/17/2014 0452   K 4.1 02/17/2014 0452   CL 101 02/17/2014 0452   CO2 23 02/17/2014 0452   GLUCOSE 127* 02/17/2014 0452   BUN 19 02/17/2014 0452   CREATININE 1.94* 02/17/2014 0452   CREATININE 1.56* 10/07/2013 1141   CALCIUM 7.9* 02/17/2014 0452   PROT 6.5 02/11/2014 2256   ALBUMIN 3.0* 02/11/2014 2256   AST 8 02/11/2014 2256   ALT <5 02/11/2014 2256   ALKPHOS 57 02/11/2014 2256   BILITOT <0.2* 02/11/2014 2256   GFRNONAA 31* 02/17/2014 0452   GFRAA 36* 02/17/2014 0452   Lipase     Component Value Date/Time   LIPASE 49 07/26/2013 2246       Studies/Results: No results found.  Anti-infectives: Anti-infectives   Start     Dose/Rate  Route Frequency Ordered Stop   02/15/14 1400  cefoTEtan (CEFOTAN) 2 g in dextrose 5 % 50 mL IVPB     2 g 100 mL/hr over 30 Minutes Intravenous 3 times per day 02/15/14 1225 02/16/14 1359   02/15/14 0945  cefoTEtan (CEFOTAN) 2 g in dextrose 5 % 50 mL IVPB  Status:  Discontinued     2 g 100 mL/hr over 30 Minutes Intravenous To Surgery 02/15/14 0939 02/15/14 1215   02/15/14 0700  cefoTEtan (CEFOTAN) 2 g in dextrose 5 % 50 mL IVPB  Status:  Discontinued     2 g 100 mL/hr over 30 Minutes Intravenous  Once 02/15/14 0655 02/15/14 1005   02/15/14 0600  [MAR Hold]  cefOXitin (MEFOXIN) 2 g in dextrose 5 % 50 mL IVPB  Status:  Discontinued     (On MAR Hold since 02/15/14 0621)  Comments:  Pharmacy may adjust dosing strength, interval, or rate of medication as needed for optimal therapy for the patient Send with patient on call to the OR.  Anesthesia to complete antibiotic administration <74min prior to incision per [facility].   2 g 100 mL/hr over 30 Minutes Intravenous On call to O.R. 02/14/14 1207 02/15/14 7425  Assessment/Plan  1. POD 2 s/p lap assisted right colectomy 2. Anemia  Plan: 1. Cont PCA 2. Will keep NPO but allow sips of clears from the floor to control the amount of intake her has.  He may be able to tolerate a tray, but I do not feel he has enough insight right now to gauge when he should stop if he eats too much. 3. Pathology pending 4. Agree with 1 unit transfusion of blood 5. Cont PT for mobilization  LOS: 6 days    OSBORNE,KELLY E 02/17/2014, 7:57 AM Pager: [phone]

## 2014-02-17 NOTE — Clinical Social Work Psych Assess (Signed)
Clinical Social Work Department CLINICAL SOCIAL WORK PSYCHIATRY SERVICE LINE ASSESSMENT 02/17/2014  Patient:  [name]  Account:  [id number]  [place] Date:  02/11/2014  Clinical Social Worker:  Wylene Men  Date/Time:  02/17/2014 01:13 PM Referred by:  Physician  Date referred:  02/17/2014 Reason for Referral  Other - See comment  Psychosocial assessment   Presenting Symptoms/Problems (In the person's/family's own words):   Psych CSW was consulted due to PHQ9 score of 17.   Abuse/Neglect/Trauma History (check all that apply)  Denies history   Abuse/Neglect/Trauma Comments:   pt denies   Psychiatric History (check all that apply)  Denies history   Psychiatric medications:  none  pt denies   Current Mental Health Hospitalizations/Previous Mental Health History:   none  pt denies OP/IP MH tx   Current provider:   pt denies   Place and Date:   Pt reports having a PCP and compliant with follow up   Current Medications:   Scheduled Meds:      . carvedilol  6.25 mg Oral BID WC  . enoxaparin (LOVENOX) injection  40 mg Subcutaneous Q24H  . insulin aspart  0-9 Units Subcutaneous 6 times per day  . morphine   Intravenous 6 times per day  . pantoprazole (PROTONIX) IV  40 mg Intravenous Q24H        Continuous Infusions:      PRN Meds:.diphenhydrAMINE, diphenhydrAMINE, naloxone, ondansetron (ZOFRAN) IV, sodium chloride, sodium chloride       Previous Impatient Admission/Date/Reason:   pt was admitted in March 2015 from an ED visit due to leg swelling   Emotional Health / Current Symptoms    Suicide/Self Harm  None reported   Suicide attempt in the past:   none reported  pt denies   Other harmful behavior:   none reported  pt denies   Psychotic/Dissociative Symptoms  None reported   Other Psychotic/Dissociative Symptoms:   Pt participated appropriately during the course of the assessment.    Attention/Behavioral Symptoms  Within Normal Limits    Other Attention / Behavioral Symptoms:   pt required re-direction during the course of the assessment. Pt was fixated with the thoughts of an apartment- [place].    Cognitive Impairment  Orientation - Place  Orientation - Self  Orientation - Situation  Orientation - Time   Other Cognitive Impairment:   none reported    Mood and Adjustment  DEPRESSION    Stress, Anxiety, Trauma, Any Recent Loss/Stressor  Grief/Loss (recent or history)   Anxiety (frequency):   Pt states strain between his relationships with both daughters after his retirement from [employer] approximately 15 years ago.  pt states that his daughters only want his money.  Pt exhibits minimal anxiety regarding placement he has been attempting to obtain at [facility] [address] (specify):   none reported   Compulsive behavior (specify):   none reported   Obsessive behavior (specify):   none reported   Other:   no other stressors other than those listed above   Substance Abuse/Use  None   SBIRT completed (please refer for detailed history):  N  Self-reported substance use:   untested  pt denies use   Urinary Drug Screen Completed:  N Alcohol level:   BAL untested  pt denies use    Environmental/Housing/Living Arrangement  HOMELESS   Who is in the home:   pt reports living with his daughter prior to admission, but will not be returning upon dc.  Emergency contact:  pt reports having no supports   Financial  Stable Income  Medicare   Patient's Strengths and Goals (patient's own words):   pt has access to health care, insurnace, and income.   Clinical Social Worker's Interpretive Summary:   psych CSW met with pt at bedside.  Pt was alert and oriented x4 lying in bed during the course of the assessment. Pt thoughts and communication were fluid and appropriate.  Pt denies sx of depression. Pt reports having strained relationship with two daughters.  Pt reports being retired  from [employer] (approx 15 years ago) and since then pt reports his life "being down hill".    Pt was main caregiver for his wife who has passed.  Pt reports caring for her in the home 24/7 prior to her passing.  Pt reports feelings of hopelessness stating "it's not supposed to be like this", "I am not supposed to go a nursing home before I die, someone is supposed to take care of me like I did for my wife".  Pt reports having multiple stressors including the aforementioned relationship strain, financial stressors and housing.    pt denies SI/HI.  Pt denies SA/ETOH use.  Pt reports wishing to obtain housing at [place] [address] which is off of [address].  Pt states that the apartment complex reported that pt's income was too high to live there.  Pt will continue attempts to live at [facility].    PT is recommending SNF/STR upon dc.  Pt is agreeable.  Pt has been to [facility] in the past and would not mind going back there.  Unit CSW to conduct SNF/STR search and present bed offers when appropriate.  Pt is aware.    psych CSW encouraged pt to follow up with PCP and continue compliance with follow up visits and medications.  Pt agreeable.   Disposition:  Psych Clinical Social Worker signing off Unit CSW to assist with SNF placement [place], [place] [phone]  Clinical Social Work

## 2014-02-17 NOTE — Plan of Care (Signed)
Problem: Phase I Progression Outcomes Goal: Pain controlled with appropriate interventions Outcome: Progressing Began using PCA

## 2014-02-17 NOTE — Clinical Social Work Placement (Addendum)
Clinical Social Work Department CLINICAL SOCIAL WORK PLACEMENT NOTE 02/17/2014  Patient:  [name],[name]  Account Number:  [id number] Admit date:  02/11/2014  Clinical Social Worker:  Delrae Sawyers  Date/time:  02/17/2014 02:24 PM  Clinical Social Work is seeking post-discharge placement for this patient at the following level of care:   SKILLED NURSING   (*CSW will update this form in Epic as items are completed)   02/17/2014  Patient/family provided with [facility] Department of Clinical Social Work's list of facilities offering this level of care within the geographic area requested by the patient (or if unable, by the patient's family).  02/17/2014  Patient/family informed of their freedom to choose among providers that offer the needed level of care, that participate in Medicare, Medicaid or managed care program needed by the patient, have an available bed and are willing to accept the patient.  02/17/2014  Patient/family informed of [facility]' ownership interest in [facility], as well as of the fact that they are under no obligation to receive care at this facility.  PASARR submitted to EDS on  PASARR number received on   FL2 transmitted to all facilities in geographic area requested by pt/family on  02/17/2014 FL2 transmitted to all facilities within larger geographic area on   Patient informed that his/her managed care company has contracts with or will negotiate with  certain facilities, including the following:     Patient/family informed of bed offers received: 02/18/14  Patient chooses bed at  Physician recommends and patient chooses bed at    Patient to be transferred to  on   Patient to be transferred to facility by  Patient and family notified of transfer on  Name of family member notified:    The following physician request were entered in Epic:   Additional Comments: PASAAR previously existing.  Lubertha Sayres, MSW, [facility] Licensed  Clinical Social Worker [phone] and [phone]

## 2014-02-17 NOTE — Progress Notes (Signed)
He states he may have passed some flatus, he has been oob, getting one unit blood. Has some bs.  May have some clears (sips) Lovenox, scds, await pathology

## 2014-02-17 NOTE — Progress Notes (Signed)
[facility] Progress Note   Indigo Chaddock BDZ:[id number] DOB: 02-20-1934 DOA: 02/11/2014 PCP: Wyatt Haste, MD  Brief narrative: [name] is a 80 y.o. male with h/o DM, HTN, pedal edema (lymphedema?), obesity and poor compliance with seeing his PCP and taking his medications. He was admitted in March of this year for "CHF" and noted to be very non-compliant with medications. He comes in to the ER being brought in by "PTAR"? for feeling like he was about to pass out. He had heme positive stools in the ER with severe anemia.  Per the records in EPIC it appears that his PCP ordered home health for him but he kicked them out of his home (see note from [name] on 5/29- telephone conversation) and so he was dismissed by them.  Subjective: He is pleasant today- has no complaints other than not having a home to go to - agreeable to going to a nursing home for rehab.  Assessment/Plan: Principal Problem:   Acute GI bleeding - has received 2 U of blood- last bloody stool occurred before admission per patient-  - consulted  DR Collene Mares who performed colonoscopy on 6/20 which revealed a cecal mass (which was seen 2 yrs ago as wel) with and attached clot - 2013- EGD- LA grade A esophagitis and mild gastritis- Colonoscopy- large mass in cecum which was biopsied (adenoma) and multiple smaller benign adenomatous polyps removed.  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- discussed with Dr Alvy Bimler- she states Gi onc nurse  will arrange follow up as outpt with GI oncology  Anemia due to acute blood loss superimposed on AOCD  - baseline appears to be around 10- dropped down to 6.4 on admission and improved by 2g after 2 UPRBC -  anemia panel in 3/15 consistent with AOCD  - repeat anemia panel consistent with AOCD  -  Hgb stable dropped again to 7 on  6/24 requiring another unit of blood- repeat Hgb was 7  - repeat Hgb this AM was 6.7- no signs of acute blood loss - no bloody stools and no back pain suggestive of a retroperitoneal bleed- check Haptoglobin, LDH, BIlirubin - giving another unit of blood- watching carefully for volume overload- wt actually down 4 kg since admissoin     AKI on CKD (chronic kidney disease), stage III - Cr on last d/c was 2.21 and on this admission 1.55 - hold ACE- may be due to blood loss - -  Cr improved today at 1.94  Hypertensive urgency - he did not have any meds at home - his pcp would not prescribe more until he went to see him and he refused to go back to see him - cont Coreg - SBP dropping to 130-140 after surgery- d/c ACE and HCTZ - he is afraid of his BP getting too low therefore I have placed holding parameters on meds for SBP < 155  Lymphedema  -ECHO in 3419 revealed systolic CHF but  per last ECHO on 01/2228, no systolic failure - mild LVF but no mention of diastolic dysfunction - note made of right atrium being mild-mod dilated (suggestive of right heart failure?) - no pulm edema on admission this time- no hypoxia -has been treated in the past with ACE wraps- avoid diuretics unless edema is causing symptoms - will need ACE wraps prior to d/c  Diabetes mellitus - last A1c in 3/15- 7.9- keep on sliding scale for now and transition to orals upon d/c- he was sent home with Glipizide 2.5 mg last time- no use in repeating A1c as he has been non-compliant    Obesity - doubt he will be interested in losing weight    Noncompliance with medications and f/u with PCP- has kicked out home health nurses   Code Status: Full code Family Communication: none Disposition Plan: sNF- hopefully tomorrow  Consultants: GI  Procedures: none  Antibiotics: Anti-infectives   Start     Dose/Rate Route Frequency Ordered Stop   02/15/14 1400  cefoTEtan (CEFOTAN) 2 g in dextrose 5 % 50 mL IVPB     2 g 100  mL/hr over 30 Minutes Intravenous 3 times per day 02/15/14 1225 02/16/14 1359   02/15/14 0945  cefoTEtan (CEFOTAN) 2 g in dextrose 5 % 50 mL IVPB  Status:  Discontinued     2 g 100 mL/hr over 30 Minutes Intravenous To Surgery 02/15/14 0939 02/15/14 1215   02/15/14 0700  cefoTEtan (CEFOTAN) 2 g in dextrose 5 % 50 mL IVPB  Status:  Discontinued     2 g 100 mL/hr over 30 Minutes Intravenous  Once 02/15/14 0655 02/15/14 1005   02/15/14 0600  [MAR Hold]  cefOXitin (MEFOXIN) 2 g in dextrose 5 % 50 mL IVPB  Status:  Discontinued     (On MAR Hold since 02/15/14 0621)  Comments:  Pharmacy may adjust dosing strength, interval, or rate of medication as needed for optimal therapy for the patient Send with patient on call to the OR.  Anesthesia to complete antibiotic administration <47min prior to incision per [facility].   2 g 100 mL/hr over 30 Minutes Intravenous On call to O.R. 02/14/14 1207 02/15/14 0655       DVT prophylaxis: SCDs  Objective: Filed Weights   02/15/14 0526 02/16/14 0608 02/17/14 0531  Weight: 108.8 kg (239 lb 13.8 oz) 108.6 kg (239 lb 6.7 oz) 106.865 kg (235 lb 9.5 oz)    Vitals Filed Vitals:   02/17/14 1115 02/17/14 1133 02/17/14 1145 02/17/14 1245  BP: 139/55  136/54 148/64  Pulse: 73  71 78  Temp: 99 F (37.2 C) 99 F (37.2 C) 99 F (37.2 C) 98.7 F (37.1 C)  TempSrc: Oral  Oral Oral  Resp: 18  16 16   Height:      Weight:      SpO2: 100%  100% 100%     Intake/Output Summary (Last 24 hours) at 02/17/14 1327 Last data filed at 02/17/14 1130  Gross per 24 hour  Intake 833.83 ml  Output    775 ml  Net  58.83 ml     Exam: General: No acute respiratory distress, AAO x 3 Lungs: Clear to auscultation bilaterally without wheezes or crackles Cardiovascular: Regular rate and rhythm without murmur gallop or rub normal S1 and S2 Abdomen:dressing noted, soft, bowel sounds Positive  Extremities: No significant cyanosis, clubbing, + pitting edema bilateral lower  extremities- skin is dry and quite wrinkled   Data Reviewed: Basic Metabolic Panel:  Recent Labs Lab 02/12/14 0956 02/13/14 0215 02/14/14 0225 02/15/14 0500 02/16/14 0415 02/17/14 0452  NA  --  140 137 139 140 138  K  --  4.5 3.8 3.8 4.5 4.1  CL  --  101 102 102 101 101  CO2  --  25 26 26 22 23   GLUCOSE  --  140* 130* 131* 162* 127*  BUN  --  20 15 12 17 19   CREATININE  --  1.58* 1.48* 1.47* 2.05* 1.94*  CALCIUM  --  8.8 8.1* 8.4 7.9* 7.9*  MG 1.6  --   --   --   --   --    Liver Function Tests:  Recent Labs Lab 02/11/14 2256  AST 8  ALT <5  ALKPHOS 57  BILITOT <0.2*  PROT 6.5  ALBUMIN 3.0*   No results found for this basename: LIPASE, AMYLASE,  in the last 168 hours No results found for this basename: AMMONIA,  in the last 168 hours CBC:  Recent Labs Lab 02/14/14 0225  02/15/14 0500 02/16/14 0415 02/16/14 1455 02/16/14 2330 02/17/14 0452  WBC 4.8  --  4.1 15.7* 14.1*  --  10.4  HGB 7.9*  < > 8.6* 7.0* 7.1* 7.0* 6.7*  HCT 25.0*  < > 26.9* 22.1* 22.2* 21.7* 20.9*  MCV 80.1  --  80.1 82.2 81.6  --  82.0  PLT 220  --  231 262 225  --  195  < > = values in this interval not displayed. Cardiac Enzymes: No results found for this basename: CKTOTAL, CKMB, CKMBINDEX, TROPONINI,  in the last 168 hours BNP (last 3 results)  Recent Labs  11/12/13 1455 12/08/13 1502 02/11/14 2256  PROBNP 2435.0* 1417.0* 2409.0*   CBG:  Recent Labs Lab 02/16/14 2003 02/17/14 0009 02/17/14 0402 02/17/14 0756 02/17/14 1128  GLUCAP 143* 134* 122* 140* 158*    Recent Results (from the past 240 hour(s))  MRSA PCR SCREENING     Status: None   Collection Time    02/12/14  3:07 AM      Result Value Ref Range Status   MRSA by PCR NEGATIVE  NEGATIVE Final   Comment:            The GeneXpert MRSA Assay (FDA     approved for NASAL specimens     only), is one component of a     comprehensive MRSA colonization     surveillance program. It is not     intended to diagnose  MRSA     infection nor to guide or     monitor treatment for     MRSA infections.  SURGICAL PCR SCREEN     Status: None   Collection Time    02/15/14  5:11 AM      Result Value Ref Range Status   MRSA, PCR NEGATIVE  NEGATIVE Final   Staphylococcus aureus NEGATIVE  NEGATIVE Final   Comment:            The Xpert SA Assay (FDA     approved for NASAL specimens     in patients over 58 years of age),     is one component of     a comprehensive surveillance     program.  Test performance has     been validated by [employer] for patients greater     than or equal to 22 year old.     It is not intended     to diagnose infection nor to     guide or monitor treatment.     Studies:  Recent x-ray studies have been reviewed in detail by the Attending Physician  Scheduled Meds:  Scheduled Meds: . carvedilol  6.25 mg Oral BID WC  . enoxaparin (LOVENOX) injection  40 mg Subcutaneous Q24H  . insulin aspart  0-9 Units Subcutaneous  6 times per day  . morphine   Intravenous 6 times per day  . pantoprazole (PROTONIX) IV  40 mg Intravenous Q24H   Continuous Infusions:    Time spent on care of this patient: 35 min   [place], MD 02/17/2014, 1:27 PM  LOS: 6 days   [facility] Office  [phone] Pager - Text Page per Shea Evans   If 7PM-7AM, please contact night-coverage [email or web address]

## 2014-02-17 NOTE — Progress Notes (Signed)
Call received from lab.  They inform me of critical lab value.  Lab reports that pt's hgb was 6.7.  Hospitalist called and notified.  New orders given.

## 2014-02-18 ENCOUNTER — Inpatient Hospital Stay (HOSPITAL_COMMUNITY): Payer: Medicare Other

## 2014-02-18 LAB — CBC
HEMATOCRIT: 25.8 % — AB (ref 39.0–52.0)
Hemoglobin: 8.2 g/dL — ABNORMAL LOW (ref 13.0–17.0)
MCH: 26.4 pg (ref 26.0–34.0)
MCHC: 31.8 g/dL (ref 30.0–36.0)
MCV: 83 fL (ref 78.0–100.0)
Platelets: 213 10*3/uL (ref 150–400)
RBC: 3.11 MIL/uL — ABNORMAL LOW (ref 4.22–5.81)
RDW: 16.6 % — ABNORMAL HIGH (ref 11.5–15.5)
WBC: 7.7 10*3/uL (ref 4.0–10.5)

## 2014-02-18 LAB — BASIC METABOLIC PANEL
BUN: 18 mg/dL (ref 6–23)
CO2: 22 meq/L (ref 19–32)
Calcium: 8.2 mg/dL — ABNORMAL LOW (ref 8.4–10.5)
Chloride: 102 mEq/L (ref 96–112)
Creatinine, Ser: 1.66 mg/dL — ABNORMAL HIGH (ref 0.50–1.35)
GFR, EST AFRICAN AMERICAN: 44 mL/min — AB (ref >90)
GFR, EST NON AFRICAN AMERICAN: 38 mL/min — AB (ref >90)
Glucose, Bld: 129 mg/dL — ABNORMAL HIGH (ref 70–99)
Potassium: 4.4 mEq/L (ref 3.7–5.3)
SODIUM: 138 meq/L (ref 137–147)

## 2014-02-18 LAB — HEMOGLOBIN AND HEMATOCRIT, BLOOD
HCT: 27.4 % — ABNORMAL LOW (ref 39.0–52.0)
Hemoglobin: 8.7 g/dL — ABNORMAL LOW (ref 13.0–17.0)

## 2014-02-18 LAB — GLUCOSE, CAPILLARY
GLUCOSE-CAPILLARY: 159 mg/dL — AB (ref 70–99)
GLUCOSE-CAPILLARY: 131 mg/dL — AB (ref 70–99)
Glucose-Capillary: 127 mg/dL — ABNORMAL HIGH (ref 70–99)
Glucose-Capillary: 129 mg/dL — ABNORMAL HIGH (ref 70–99)
Glucose-Capillary: 132 mg/dL — ABNORMAL HIGH (ref 70–99)
Glucose-Capillary: 137 mg/dL — ABNORMAL HIGH (ref 70–99)
Glucose-Capillary: 157 mg/dL — ABNORMAL HIGH (ref 70–99)

## 2014-02-18 LAB — HAPTOGLOBIN: Haptoglobin: 240 mg/dL — ABNORMAL HIGH (ref 45–215)

## 2014-02-18 LAB — TYPE AND SCREEN
ABO/RH(D): O POS
Antibody Screen: NEGATIVE
Unit division: 0
Unit division: 0

## 2014-02-18 MED ORDER — HYDRALAZINE HCL 20 MG/ML IJ SOLN
5.0000 mg | Freq: Once | INTRAMUSCULAR | Status: AC
  Administered 2014-02-18: 5 mg via INTRAVENOUS
  Filled 2014-02-18: qty 1

## 2014-02-18 MED ORDER — HYDRALAZINE HCL 20 MG/ML IJ SOLN
10.0000 mg | Freq: Once | INTRAMUSCULAR | Status: AC
  Administered 2014-02-18: 10 mg via INTRAVENOUS
  Filled 2014-02-18: qty 1

## 2014-02-18 NOTE — Progress Notes (Signed)
Agree with above, will check cbc later today, I don't think he is currently bleeding, will continue just sips now although has passed some flatus, path shows colon cancer and we discussed this today.

## 2014-02-18 NOTE — Progress Notes (Signed)
Pt. Is refusing NT to take his blood sugar.  No insulin given.  Will continue to monitor. Syliva Overman

## 2014-02-18 NOTE — Progress Notes (Addendum)
Patient ID: [name], male   DOB: August 04, 1934, 80 y.o.   MRN: [id number] 3 Days Post-Op  Subjective: Had a long d/w the patient today about his fears and concerns, mostly over social issues.  He began passing some blood from his rectum this morning.  He tolerates some of his clears yesterday.    Objective: Vital signs in last 24 hours: Temp:  [98.4 F (36.9 C)-99 F (37.2 C)] 98.8 F (37.1 C) (06/26 0937) Pulse Rate:  [69-86] 73 (06/26 0937) Resp:  [9-20] 14 (06/26 0937) BP: (136-198)/(54-88) 150/69 mmHg (06/26 0937) SpO2:  [95 %-100 %] 96 % (06/26 0937) Weight:  [251 lb 5.2 oz (114 kg)] 251 lb 5.2 oz (114 kg) (06/26 0559) Last BM Date: 02/14/14  Intake/Output from previous day: 06/25 0701 - 06/26 0700 In: 843 [P.O.:480; I.V.:50; Blood:313] Out: 1675 [Urine:1675] Intake/Output this shift:    PE: Abd: soft, but seems a bit more distended today than he was yesterday.  Few BS, incision c/d/i with staples present HEart: irregular  Lab Results:   Recent Labs  02/17/14 0452 02/17/14 1730 02/18/14 0421  WBC 10.4  --  7.7  HGB 6.7* 8.2* 8.2*  HCT 20.9* 24.8* 25.8*  PLT 195  --  213   BMET  Recent Labs  02/17/14 0452 02/18/14 0421  NA 138 138  K 4.1 4.4  CL 101 102  CO2 23 22  GLUCOSE 127* 129*  BUN 19 18  CREATININE 1.94* 1.66*  CALCIUM 7.9* 8.2*   PT/INR No results found for this basename: LABPROT, INR,  in the last 72 hours CMP     Component Value Date/Time   NA 138 02/18/2014 0421   K 4.4 02/18/2014 0421   CL 102 02/18/2014 0421   CO2 22 02/18/2014 0421   GLUCOSE 129* 02/18/2014 0421   BUN 18 02/18/2014 0421   CREATININE 1.66* 02/18/2014 0421   CREATININE 1.56* 10/07/2013 1141   CALCIUM 8.2* 02/18/2014 0421   PROT 6.5 02/11/2014 2256   ALBUMIN 3.0* 02/11/2014 2256   AST 8 02/11/2014 2256   ALT <5 02/11/2014 2256   ALKPHOS 57 02/11/2014 2256   BILITOT 0.4 02/17/2014 1815   GFRNONAA 38* 02/18/2014 0421   GFRAA 44* 02/18/2014 0421   Lipase     Component  Value Date/Time   LIPASE 49 07/26/2013 2246       Studies/Results: No results found.  Anti-infectives: Anti-infectives   Start     Dose/Rate Route Frequency Ordered Stop   02/15/14 1400  cefoTEtan (CEFOTAN) 2 g in dextrose 5 % 50 mL IVPB     2 g 100 mL/hr over 30 Minutes Intravenous 3 times per day 02/15/14 1225 02/16/14 1359   02/15/14 0945  cefoTEtan (CEFOTAN) 2 g in dextrose 5 % 50 mL IVPB  Status:  Discontinued     2 g 100 mL/hr over 30 Minutes Intravenous To Surgery 02/15/14 0939 02/15/14 1215   02/15/14 0700  cefoTEtan (CEFOTAN) 2 g in dextrose 5 % 50 mL IVPB  Status:  Discontinued     2 g 100 mL/hr over 30 Minutes Intravenous  Once 02/15/14 0655 02/15/14 1005   02/15/14 0600  [MAR Hold]  cefOXitin (MEFOXIN) 2 g in dextrose 5 % 50 mL IVPB  Status:  Discontinued     (On MAR Hold since 02/15/14 0621)  Comments:  Pharmacy may adjust dosing strength, interval, or rate of medication as needed for optimal therapy for the patient Send with patient on call to the OR.  Anesthesia to complete antibiotic administration <16min prior to incision per [facility].   2 g 100 mL/hr over 30 Minutes Intravenous On call to O.R. 02/14/14 1207 02/15/14 0655       Assessment/Plan   1. POD 3 s/p lap assisted right colectomy  2. Acute blood loss anemia  Plan: 1. SW consult may be appropriate for this patient as he has a lot of concerns about basically being homeless and his disposition. 2. I have stopped his lovenox given his blood per rectum episode this morning.  I suspect he may have had a little ooze from his anastomosis.  Will hold prophylaxis at least today and follow output and determine when it can be restarted.  Agree with rechecking a hgb at 1400 this afternoon 3. Explained to patient why PT is important and encouraged him to continue with mobilization. 4. Hold on sips of clears as he seems a bit more distended today and do not want more liquids going past his anastomosis right now.   LOS: 7 days    OSBORNE,KELLY E 02/18/2014, 9:50 AM Pager: [phone]

## 2014-02-18 NOTE — Progress Notes (Signed)
Pt. Is refusing blood pressure medicines.  BP is currently 187/88.  Pt. Became very agitated and angry when I mentioned a BP medicine.  Pt stated that he "needs his blood pressure to be high to stay alive and it has been this way for 15 years. I have been trying to tell everyone that for 15 years."  I educated the pt on the importance of maintaining a good BP and the risks of a high one.  Pt is still refusing.  Will continue to monitor and educate pt. Syliva Overman

## 2014-02-18 NOTE — Progress Notes (Addendum)
Passed blood in BM just now- suspect he bled on the 24th and 25th (which is when the hemoglobin drop was noted). eh did not pass any blood at that time- there was no bleed noted.  Suspect he may now be passing the (old) blood. Will recheck a Hgb at 2 PM to confirm this.   Surgery has stopped anticoagulation which is reasonable- keep SCDs on and ambulate as much as possible.   Full note to follow later today.   Bartholomew Crews, MD

## 2014-02-18 NOTE — Progress Notes (Signed)
[facility] Progress Note   [name] XVQ:[id number] DOB: 1933-12-08 DOA: 02/11/2014 PCP: Wyatt Haste, MD  Brief narrative: [name] is a 80 y.o. male with h/o DM, HTN, pedal edema (lymphedema?), obesity and poor compliance with seeing his PCP and taking his medications. He was admitted in March of this year for "CHF" and noted to be very non-compliant with medications. He comes in to the ER being brought in by "PTAR"? for feeling like he was about to pass out. He had heme positive stools in the ER with severe anemia.  Per the records in [facility] it appears that his PCP ordered home health for him but he kicked them out of his home (see note from [name] on 5/29- telephone conversation) and so he was dismissed by them.  Subjective: He is pleasant today- has no complaints other than not having a home to go to - agreeable to going to a nursing home for rehab.  Assessment/Plan: Principal Problem:   Acute GI bleeding - has received 2 U of blood earlier on in the admission- last bloody stool occurred before admission per patient-  - consulted  DR Collene Mares who performed colonoscopy on 6/20 which revealed a cecal mass (which was seen 2 yrs ago as wel) with and attached clot - 2013- EGD- LA grade A esophagitis and mild gastritis- Colonoscopy- large mass in cecum which was biopsied (adenoma) and multiple smaller benign adenomatous polyps removed.  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- discussed with Dr Alvy Bimler- she states Gi onc nurse  will arrange follow up as outpt with GI oncology  Anemia due to acute blood loss  superimposed on AOCD  - baseline appears to be around 10- dropped down to 6.4 on admission and improved by 2g after 2 UPRBC -  anemia panel in 3/15 consistent with AOCD  - repeat anemia panel consistent with AOCD  -  Hgb stable dropped again to 7 on 6/24 requiring another unit of blood- repeat Hgb was 7  - repeat Hgb this AM was 6.7- no signs of acute blood loss - no bloody stools and no back pain suggestive of a retroperitoneal bleed- check Haptoglobin, LDH, BIlirubin - giving another unit of blood-  - see above under heading of GI bleed    AKI on CKD (chronic kidney disease), stage III - Cr on last d/c was 2.21 and on this admission 1.55 - hold ACE- may be due to blood loss - -  Cr improved today   Hypertensive urgency - he did not have any meds at home - his pcp would not prescribe more until he went to see him and he refused to go back to see him - cont Coreg - SBP dropping to 130-140 after surgery- d/c ACE and HCTZ - he is afraid of his BP getting too low therefore I have placed holding parameters on meds for SBP < 155  Lymphedema  -ECHO in 0932 revealed systolic CHF but  per last ECHO on 01/7123, no systolic failure - mild LVF but no mention of diastolic dysfunction - note made of right atrium  being mild-mod dilated (suggestive of right heart failure?) - no pulm edema on admission this time- no hypoxia -has been treated in the past with ACE wraps- avoid diuretics unless edema is causing symptoms - will need ACE wraps prior to d/c    Diabetes mellitus - last A1c in 3/15- 7.9- keep on sliding scale for now and transition to orals upon d/c- he was sent home with Glipizide 2.5 mg last time- no use in repeating A1c as he has been non-compliant    Obesity - doubt he will be interested in losing weight    Noncompliance with medications and f/u with PCP- has kicked out home health nurses   Code Status: Full code Family Communication: none Disposition Plan: SNF- hopefully tomorrow-  does not want to return home to live with his dauther and does not want to be in a nursing home permanently - will need continued f/u by social work at [facility]  Consultants: GI  Procedures: none  Antibiotics: Anti-infectives   Start     Dose/Rate Route Frequency Ordered Stop   02/15/14 1400  cefoTEtan (CEFOTAN) 2 g in dextrose 5 % 50 mL IVPB     2 g 100 mL/hr over 30 Minutes Intravenous 3 times per day 02/15/14 1225 02/16/14 1359   02/15/14 0945  cefoTEtan (CEFOTAN) 2 g in dextrose 5 % 50 mL IVPB  Status:  Discontinued     2 g 100 mL/hr over 30 Minutes Intravenous To Surgery 02/15/14 0939 02/15/14 1215   02/15/14 0700  cefoTEtan (CEFOTAN) 2 g in dextrose 5 % 50 mL IVPB  Status:  Discontinued     2 g 100 mL/hr over 30 Minutes Intravenous  Once 02/15/14 0655 02/15/14 1005   02/15/14 0600  [MAR Hold]  cefOXitin (MEFOXIN) 2 g in dextrose 5 % 50 mL IVPB  Status:  Discontinued     (On MAR Hold since 02/15/14 0621)  Comments:  Pharmacy may adjust dosing strength, interval, or rate of medication as needed for optimal therapy for the patient Send with patient on call to the OR.  Anesthesia to complete antibiotic administration <65min prior to incision per [facility].   2 g 100 mL/hr over 30 Minutes Intravenous On call to O.R. 02/14/14 1207 02/15/14 0655       DVT prophylaxis: SCDs  Objective: Filed Weights   02/16/14 0608 02/17/14 0531 02/18/14 0559  Weight: 108.6 kg (239 lb 6.7 oz) 106.865 kg (235 lb 9.5 oz) 114 kg (251 lb 5.2 oz)    Vitals Filed Vitals:   02/18/14 0956 02/18/14 1136 02/18/14 1304 02/18/14 1559  BP: 187/88  170/65   Pulse: 78  69   Temp:   97.7 F (36.5 C)   TempSrc:   Oral   Resp:  10 19 13   Height:      Weight:      SpO2:  99% 100% 100%     Intake/Output Summary (Last 24 hours) at 02/18/14 1636 Last data filed at 02/18/14 0500  Gross per 24 hour  Intake    480 ml  Output   1300 ml  Net   -820 ml     Exam: General: No acute respiratory distress,  AAO x 3 Lungs: Clear to auscultation bilaterally without wheezes or crackles Cardiovascular: Regular rate and rhythm without murmur gallop or rub normal S1 and S2 Abdomen:dressing noted, soft, bowel sounds Positive  Extremities: No significant cyanosis, clubbing, + pitting edema bilateral lower extremities- skin is dry and quite wrinkled  Data Reviewed: Basic Metabolic Panel:  Recent Labs Lab 02/12/14 0956  02/14/14 0225 02/15/14 0500 02/16/14 0415 02/17/14 0452 02/18/14 0421  NA  --   < > 137 139 140 138 138  K  --   < > 3.8 3.8 4.5 4.1 4.4  CL  --   < > 102 102 101 101 102  CO2  --   < > 26 26 22 23 22   GLUCOSE  --   < > 130* 131* 162* 127* 129*  BUN  --   < > 15 12 17 19 18   CREATININE  --   < > 1.48* 1.47* 2.05* 1.94* 1.66*  CALCIUM  --   < > 8.1* 8.4 7.9* 7.9* 8.2*  MG 1.6  --   --   --   --   --   --   < > = values in this interval not displayed. Liver Function Tests:  Recent Labs Lab 02/11/14 2256 02/17/14 1815  AST 8  --   ALT <5  --   ALKPHOS 57  --   BILITOT <0.2* 0.4  PROT 6.5  --   ALBUMIN 3.0*  --    No results found for this basename: LIPASE, AMYLASE,  in the last 168 hours No results found for this basename: AMMONIA,  in the last 168 hours CBC:  Recent Labs Lab 02/15/14 0500 02/16/14 0415 02/16/14 1455 02/16/14 2330 02/17/14 0452 02/17/14 1730 02/18/14 0421 02/18/14 1520  WBC 4.1 15.7* 14.1*  --  10.4  --  7.7  --   HGB 8.6* 7.0* 7.1* 7.0* 6.7* 8.2* 8.2* 8.7*  HCT 26.9* 22.1* 22.2* 21.7* 20.9* 24.8* 25.8* 27.4*  MCV 80.1 82.2 81.6  --  82.0  --  83.0  --   PLT 231 262 225  --  195  --  213  --    Cardiac Enzymes: No results found for this basename: CKTOTAL, CKMB, CKMBINDEX, TROPONINI,  in the last 168 hours BNP (last 3 results)  Recent Labs  11/12/13 1455 12/08/13 1502 02/11/14 2256  PROBNP 2435.0* 1417.0* 2409.0*   CBG:  Recent Labs Lab 02/18/14 0020 02/18/14 0440 02/18/14 0828 02/18/14 1152 02/18/14 1559  GLUCAP 127*  132* 157* 159* 131*    Recent Results (from the past 240 hour(s))  MRSA PCR SCREENING     Status: None   Collection Time    02/12/14  3:07 AM      Result Value Ref Range Status   MRSA by PCR NEGATIVE  NEGATIVE Final   Comment:            The GeneXpert MRSA Assay (FDA     approved for NASAL specimens     only), is one component of a     comprehensive MRSA colonization     surveillance program. It is not     intended to diagnose MRSA     infection nor to guide or     monitor treatment for     MRSA infections.  SURGICAL PCR SCREEN     Status: None   Collection Time    02/15/14  5:11 AM      Result Value Ref Range Status   MRSA, PCR NEGATIVE  NEGATIVE Final   Staphylococcus aureus NEGATIVE  NEGATIVE Final   Comment:            The Xpert SA Assay (FDA     approved for NASAL specimens     in patients over 21 years  of age),     is one component of     a comprehensive surveillance     program.  Test performance has     been validated by [employer] for patients greater     than or equal to 21 year old.     It is not intended     to diagnose infection nor to     guide or monitor treatment.     Studies:  Recent x-ray studies have been reviewed in detail by the Attending Physician  Scheduled Meds:  Scheduled Meds: . carvedilol  6.25 mg Oral BID WC  . insulin aspart  0-9 Units Subcutaneous 6 times per day  . morphine   Intravenous 6 times per day  . pantoprazole (PROTONIX) IV  40 mg Intravenous Q24H   Continuous Infusions:    Time spent on care of this patient: 35 min   [place], MD 02/18/2014, 4:36 PM  LOS: 7 days   [facility] Office  [phone] Pager - Text Page per Shea Evans   If 7PM-7AM, please contact night-coverage [email or web address]

## 2014-02-18 NOTE — Progress Notes (Signed)
Pt requesting items from security.  All items received from security and given to pt.  Pt verifies that all items are present.  Pt aware that we are not responsible for monitoring personal belongings during the rest of his stay. Syliva Overman

## 2014-02-18 NOTE — Progress Notes (Signed)
CSW provided pt with bed offers. Pt is requesting placement at [facility] however he is agreeable to [facility] if [facility] does not response or declines offer. CSW has left a message for the admissions person at [facility] to obtain a bed offer.  Hunt Oris, MSW, [place]

## 2014-02-19 ENCOUNTER — Inpatient Hospital Stay (HOSPITAL_COMMUNITY): Payer: Medicare Other

## 2014-02-19 DIAGNOSIS — N179 Acute kidney failure, unspecified: Secondary | ICD-10-CM

## 2014-02-19 DIAGNOSIS — D62 Acute posthemorrhagic anemia: Secondary | ICD-10-CM

## 2014-02-19 DIAGNOSIS — I10 Essential (primary) hypertension: Secondary | ICD-10-CM

## 2014-02-19 LAB — CBC
HEMATOCRIT: 27.9 % — AB (ref 39.0–52.0)
HEMOGLOBIN: 8.9 g/dL — AB (ref 13.0–17.0)
MCH: 26.6 pg (ref 26.0–34.0)
MCHC: 31.9 g/dL (ref 30.0–36.0)
MCV: 83.3 fL (ref 78.0–100.0)
Platelets: 216 10*3/uL (ref 150–400)
RBC: 3.35 MIL/uL — AB (ref 4.22–5.81)
RDW: 16.7 % — ABNORMAL HIGH (ref 11.5–15.5)
WBC: 5.7 10*3/uL (ref 4.0–10.5)

## 2014-02-19 LAB — GLUCOSE, CAPILLARY
GLUCOSE-CAPILLARY: 119 mg/dL — AB (ref 70–99)
Glucose-Capillary: 119 mg/dL — ABNORMAL HIGH (ref 70–99)
Glucose-Capillary: 114 mg/dL — ABNORMAL HIGH (ref 70–99)
Glucose-Capillary: 105 mg/dL — ABNORMAL HIGH (ref 70–99)
Glucose-Capillary: 104 mg/dL — ABNORMAL HIGH (ref 70–99)

## 2014-02-19 MED ORDER — CARVEDILOL 12.5 MG PO TABS
12.5000 mg | ORAL_TABLET | Freq: Two times a day (BID) | ORAL | Status: DC
  Administered 2014-02-19 – 2014-02-25 (×10): 12.5 mg via ORAL
  Filled 2014-02-19 (×15): qty 1

## 2014-02-19 MED ORDER — HEPARIN SODIUM (PORCINE) 5000 UNIT/ML IJ SOLN
5000.0000 [IU] | Freq: Three times a day (TID) | INTRAMUSCULAR | Status: DC
  Administered 2014-02-19 – 2014-02-25 (×15): 5000 [IU] via SUBCUTANEOUS
  Filled 2014-02-19 (×25): qty 1

## 2014-02-19 MED ORDER — HYDRALAZINE HCL 20 MG/ML IJ SOLN
10.0000 mg | Freq: Once | INTRAMUSCULAR | Status: AC
  Administered 2014-02-19: 10 mg via INTRAVENOUS
  Filled 2014-02-19: qty 1

## 2014-02-19 NOTE — Progress Notes (Signed)
PROGRESS NOTE  [name] QIH:[id number] DOB: 04-Jul-1934 DOA: 02/11/2014 PCP: Wyatt Haste, MD  Assessment/Plan: GI Bleed -Patient received 2 units PRBCs 02/11/2014 -Received additional 2 units PRBCs 02/16/2014 -Hemoglobin has remained stable since that time -Denies any further rectal bleeding -02/13/2014 colonoscopy (Dr. Dalphine Handing mass -Continue Protonix IV until able to tolerate by mouth Cecal mass -Pathology = adenocarcinoma, lymph nodes negative -Dr. Wynelle Cleveland spoke with Dr. Marius Ditch states Gi onc nurse will arrange follow up as outpt with GI oncology -Status post right hemicolectomy 02/15/2014--Dr. Donne Hazel  Acute blood loss anemia -Superimposed upon AOCD -Baseline hemoglobin 9-10 -Hemoglobin remained stable since 2 units PRBCs 02/16/2014 -The patient passed old blood pm 6/26--hemoglobin remained stable -LDH, haptoglobin, bilirubin unremarkable HTN -Poorly controlled  -Increase carvedilol 12.5 mg twice a day -Hydralazine when necessary SBP greater than 180  Acute on chronic renal failure (CKD stage III) -Furosemide and lisinopril discontinued -Baseline creatinine 1.4-1.8 Diabetes mellitus type 2 -Hemoglobin A1c 7.0--02/12/2014 -Continue NovoLog sliding scale -Patient takes glipizide in the outpatient setting which has been held Lymphedema  -ECHO in 8756 revealed systolic CHF but per last ECHO on 11/3327, no systolic failure - mild LVH but no mention of diastolic dysfunction - note made of right atrium being mild-mod dilated (suggestive of right heart failure?)  - no pulm edema on admission this time- no hypoxia  -has been treated in the past with ACE wraps- hold diuretics for now - will need ACE wraps prior to d/c   Family Communication:   Pt at beside Disposition Plan:   SNF--SW is following     Procedures/Studies: Dg Chest Portable 1 View  02/11/2014   CLINICAL DATA:  NEAR SYNCOPE  EXAM: PORTABLE CHEST - 1 VIEW  COMPARISON:  Prior  radiograph from 12/08/2013  FINDINGS: Prominent cardiomegaly is stable as compared to prior exam. Mediastinal silhouette within normal limits. Few scattered atherosclerotic calcifications present within the aortic arch.  The lungs are normally inflated. There is mild central perihilar pulmonary vascular congestion without overt pulmonary edema. No definite pleural effusion. No focal infiltrate identified. There is no pneumothorax.  No acute osseous abnormality identified.  IMPRESSION: Stable cardiomegaly with mild central perihilar vascular congestion without overt pulmonary edema.   Electronically Signed   By: Jeannine Boga M.D.   On: 02/11/2014 23:53   Dg Abd Portable 1v  02/18/2014   CLINICAL DATA:  Ileus.  EXAM: PORTABLE ABDOMEN - 1 VIEW  COMPARISON:  Abdominal series 12 /08/2012.  FINDINGS: Distended loops of small and large bowel noted consistent adynamic ileus. No free air identified. Surgical staples are noted the abdomen. Degenerative changes lumbar spine and both hips.  IMPRESSION: Distended loops of small and large bowel consistent with adynamic ileus.   Electronically Signed   By: Marcello Moores  Register   On: 02/18/2014 10:30         Subjective: Patient complaint of passing some blood yesterday per rectum. Denies nausea, vomiting, chest pain, shortness breath, fevers, chills, abdominal pain, dysuria, hematuria. He feels hungry and wants to eat.   Objective: Filed Vitals:   02/19/14 0400 02/19/14 0620 02/19/14 0621 02/19/14 0812  BP:  206/89 206/89   Pulse:  72 72   Temp:  97.9 F (36.6 C) 97.9 F (36.6 C)   TempSrc:   Oral   Resp: 11 13 13 13   Height:      Weight:   113.6 kg (250 lb 7.1 oz)   SpO2: 100% 100% 100% 100%  Intake/Output Summary (Last 24 hours) at 02/19/14 1007 Last data filed at 02/19/14 0753  Gross per 24 hour  Intake      0 ml  Output    725 ml  Net   -725 ml   Weight change: -0.4 kg (-14.1 oz) Exam:   General:  Pt is alert, follows commands  appropriately, not in acute distress  HEENT: No icterus, No thrush,[place]/AT  Cardiovascular: RRR, S1/S2, no rubs, no gallops  Respiratory: CTA bilaterally, no wheezing, no crackles, no rhonchi  Abdomen: Soft/+BS, non tender, non distended, no guarding  Extremities: 2+LE edema, No lymphangitis, No petechiae, No rashes, no synovitis  Data Reviewed: Basic Metabolic Panel:  Recent Labs Lab 02/14/14 0225 02/15/14 0500 02/16/14 0415 02/17/14 0452 02/18/14 0421  NA 137 139 140 138 138  K 3.8 3.8 4.5 4.1 4.4  CL 102 102 101 101 102  CO2 26 26 22 23 22   GLUCOSE 130* 131* 162* 127* 129*  BUN 15 12 17 19 18   CREATININE 1.48* 1.47* 2.05* 1.94* 1.66*  CALCIUM 8.1* 8.4 7.9* 7.9* 8.2*   Liver Function Tests:  Recent Labs Lab 02/17/14 1815  BILITOT 0.4   No results found for this basename: LIPASE, AMYLASE,  in the last 168 hours No results found for this basename: AMMONIA,  in the last 168 hours CBC:  Recent Labs Lab 02/16/14 0415 02/16/14 1455  02/17/14 0452 02/17/14 1730 02/18/14 0421 02/18/14 1520 02/19/14 0147  WBC 15.7* 14.1*  --  10.4  --  7.7  --  5.7  HGB 7.0* 7.1*  < > 6.7* 8.2* 8.2* 8.7* 8.9*  HCT 22.1* 22.2*  < > 20.9* 24.8* 25.8* 27.4* 27.9*  MCV 82.2 81.6  --  82.0  --  83.0  --  83.3  PLT 262 225  --  195  --  213  --  216  < > = values in this interval not displayed. Cardiac Enzymes: No results found for this basename: CKTOTAL, CKMB, CKMBINDEX, TROPONINI,  in the last 168 hours BNP: No components found with this basename: POCBNP,  CBG:  Recent Labs Lab 02/18/14 1152 02/18/14 1559 02/18/14 1936 02/18/14 2322 02/19/14 0750  GLUCAP 159* 131* 129* 137* 114*    Recent Results (from the past 240 hour(s))  MRSA PCR SCREENING     Status: None   Collection Time    02/12/14  3:07 AM      Result Value Ref Range Status   MRSA by PCR NEGATIVE  NEGATIVE Final   Comment:            The GeneXpert MRSA Assay (FDA     approved for NASAL specimens     only),  is one component of a     comprehensive MRSA colonization     surveillance program. It is not     intended to diagnose MRSA     infection nor to guide or     monitor treatment for     MRSA infections.  SURGICAL PCR SCREEN     Status: None   Collection Time    02/15/14  5:11 AM      Result Value Ref Range Status   MRSA, PCR NEGATIVE  NEGATIVE Final   Staphylococcus aureus NEGATIVE  NEGATIVE Final   Comment:            The Xpert SA Assay (FDA     approved for NASAL specimens     in patients over 84 years of age),  is one component of     a comprehensive surveillance     program.  Test performance has     been validated by [facility] for patients greater     than or equal to 8 year old.     It is not intended     to diagnose infection nor to     guide or monitor treatment.     Scheduled Meds: . carvedilol  12.5 mg Oral BID WC  . heparin subcutaneous  5,000 Units Subcutaneous 3 times per day  . insulin aspart  0-9 Units Subcutaneous 6 times per day  . morphine   Intravenous 6 times per day  . pantoprazole (PROTONIX) IV  40 mg Intravenous Q24H   Continuous Infusions:    Cristian Grieves, DO  [facility] Pager [phone]  If 7PM-7AM, please contact night-coverage [email or web address] Password [email or web address] 02/19/2014, 10:07 AM   LOS: 8 days

## 2014-02-19 NOTE — Progress Notes (Signed)
Patient ID: Guillermo Nehring, male   DOB: Dec 06, 1933, 80 y.o.   MRN: [id number] 4 Days Post-Op  Subjective: Wanting to eat.  Denies passing any more blood from his rectum.  Seems to be tolerating sips of clears.  Passing flatus, no BM    Objective: Vital signs in last 24 hours: Temp:  [97.7 F (36.5 C)-98.8 F (37.1 C)] 97.9 F (36.6 C) (06/27 0621) Pulse Rate:  [66-78] 72 (06/27 0621) Resp:  [10-21] 13 (06/27 0812) BP: (139-206)/(61-89) 206/89 mmHg (06/27 0621) SpO2:  [96 %-100 %] 100 % (06/27 0812) Weight:  [250 lb 7.1 oz (113.6 kg)] 250 lb 7.1 oz (113.6 kg) (06/27 0621) Last BM Date: 02/18/14  Intake/Output from previous day: 06/26 0701 - 06/27 0700 In: -  Out: 425 [Urine:425] Intake/Output this shift: Total I/O In: -  Out: 300 [Urine:300]  PE: Gen: NAD Abd: soft, somewhat distended.  Few BS, incision c/d/i with staples present   Lab Results:   Recent Labs  02/18/14 0421 02/18/14 1520 02/19/14 0147  WBC 7.7  --  5.7  HGB 8.2* 8.7* 8.9*  HCT 25.8* 27.4* 27.9*  PLT 213  --  216   BMET  Recent Labs  02/17/14 0452 02/18/14 0421  NA 138 138  K 4.1 4.4  CL 101 102  CO2 23 22  GLUCOSE 127* 129*  BUN 19 18  CREATININE 1.94* 1.66*  CALCIUM 7.9* 8.2*   PT/INR No results found for this basename: LABPROT, INR,  in the last 72 hours CMP     Component Value Date/Time   NA 138 02/18/2014 0421   K 4.4 02/18/2014 0421   CL 102 02/18/2014 0421   CO2 22 02/18/2014 0421   GLUCOSE 129* 02/18/2014 0421   BUN 18 02/18/2014 0421   CREATININE 1.66* 02/18/2014 0421   CREATININE 1.56* 10/07/2013 1141   CALCIUM 8.2* 02/18/2014 0421   PROT 6.5 02/11/2014 2256   ALBUMIN 3.0* 02/11/2014 2256   AST 8 02/11/2014 2256   ALT <5 02/11/2014 2256   ALKPHOS 57 02/11/2014 2256   BILITOT 0.4 02/17/2014 1815   GFRNONAA 38* 02/18/2014 0421   GFRAA 44* 02/18/2014 0421   Lipase     Component Value Date/Time   LIPASE 49 07/26/2013 2246       Studies/Results: Dg Abd Portable  1v  02/18/2014   CLINICAL DATA:  Ileus.  EXAM: PORTABLE ABDOMEN - 1 VIEW  COMPARISON:  Abdominal series 12 /08/2012.  FINDINGS: Distended loops of small and large bowel noted consistent adynamic ileus. No free air identified. Surgical staples are noted the abdomen. Degenerative changes lumbar spine and both hips.  IMPRESSION: Distended loops of small and large bowel consistent with adynamic ileus.   Electronically Signed   By: Marcello Moores  Register   On: 02/18/2014 10:30    Anti-infectives: Anti-infectives   Start     Dose/Rate Route Frequency Ordered Stop   02/15/14 1400  cefoTEtan (CEFOTAN) 2 g in dextrose 5 % 50 mL IVPB     2 g 100 mL/hr over 30 Minutes Intravenous 3 times per day 02/15/14 1225 02/16/14 1359   02/15/14 0945  cefoTEtan (CEFOTAN) 2 g in dextrose 5 % 50 mL IVPB  Status:  Discontinued     2 g 100 mL/hr over 30 Minutes Intravenous To Surgery 02/15/14 0939 02/15/14 1215   02/15/14 0700  cefoTEtan (CEFOTAN) 2 g in dextrose 5 % 50 mL IVPB  Status:  Discontinued     2 g 100 mL/hr over 30 Minutes  Intravenous  Once 02/15/14 0655 02/15/14 1005   02/15/14 0600  [MAR Hold]  cefOXitin (MEFOXIN) 2 g in dextrose 5 % 50 mL IVPB  Status:  Discontinued     (On MAR Hold since 02/15/14 0621)  Comments:  Pharmacy may adjust dosing strength, interval, or rate of medication as needed for optimal therapy for the patient Send with patient on call to the OR.  Anesthesia to complete antibiotic administration <40min prior to incision per [facility].   2 g 100 mL/hr over 30 Minutes Intravenous On call to O.R. 02/14/14 1207 02/15/14 0655       Assessment/Plan   1. POD 4 s/p lap assisted right colectomy  2. Acute blood loss anemia  Plan: 1. SW consult may be appropriate for this patient as he has a lot of concerns about his disposition. 2. Hgb stable, no rectal bleeding, will restart SQ hep for DVT prophylaxis  3. Cont PT, ambulation 4. Will recheck AXR today before advancing diet  LOS: 8 days     THOMAS, ALICIA C. 7/86/7672, 0:94 AM

## 2014-02-20 LAB — CBC
HCT: 24 % — ABNORMAL LOW (ref 39.0–52.0)
Hemoglobin: 7.6 g/dL — ABNORMAL LOW (ref 13.0–17.0)
MCH: 26.4 pg (ref 26.0–34.0)
MCHC: 31.7 g/dL (ref 30.0–36.0)
MCV: 83.3 fL (ref 78.0–100.0)
Platelets: 207 10*3/uL (ref 150–400)
RBC: 2.88 MIL/uL — AB (ref 4.22–5.81)
RDW: 16.6 % — AB (ref 11.5–15.5)
WBC: 3.2 10*3/uL — ABNORMAL LOW (ref 4.0–10.5)

## 2014-02-20 LAB — GLUCOSE, CAPILLARY
GLUCOSE-CAPILLARY: 102 mg/dL — AB (ref 70–99)
Glucose-Capillary: 100 mg/dL — ABNORMAL HIGH (ref 70–99)
Glucose-Capillary: 111 mg/dL — ABNORMAL HIGH (ref 70–99)
Glucose-Capillary: 115 mg/dL — ABNORMAL HIGH (ref 70–99)
Glucose-Capillary: 118 mg/dL — ABNORMAL HIGH (ref 70–99)
Glucose-Capillary: 129 mg/dL — ABNORMAL HIGH (ref 70–99)
Glucose-Capillary: 93 mg/dL (ref 70–99)

## 2014-02-20 LAB — BASIC METABOLIC PANEL
BUN: 17 mg/dL (ref 6–23)
CALCIUM: 8.4 mg/dL (ref 8.4–10.5)
CHLORIDE: 101 meq/L (ref 96–112)
CO2: 24 mEq/L (ref 19–32)
CREATININE: 1.31 mg/dL (ref 0.50–1.35)
GFR calc Af Amer: 58 mL/min — ABNORMAL LOW (ref >90)
GFR calc non Af Amer: 50 mL/min — ABNORMAL LOW (ref >90)
GLUCOSE: 97 mg/dL (ref 70–99)
Potassium: 4.1 mEq/L (ref 3.7–5.3)
Sodium: 139 mEq/L (ref 137–147)

## 2014-02-20 MED ORDER — CHLORHEXIDINE GLUCONATE 0.12 % MT SOLN
15.0000 mL | Freq: Two times a day (BID) | OROMUCOSAL | Status: DC
  Administered 2014-02-20 – 2014-02-24 (×8): 15 mL via OROMUCOSAL
  Filled 2014-02-20 (×8): qty 15

## 2014-02-20 MED ORDER — HYDRALAZINE HCL 20 MG/ML IJ SOLN
10.0000 mg | Freq: Four times a day (QID) | INTRAMUSCULAR | Status: DC | PRN
  Administered 2014-02-20 – 2014-02-21 (×2): 10 mg via INTRAVENOUS
  Filled 2014-02-20 (×2): qty 1

## 2014-02-20 MED ORDER — BIOTENE DRY MOUTH MT LIQD
15.0000 mL | Freq: Two times a day (BID) | OROMUCOSAL | Status: DC
  Administered 2014-02-21 – 2014-02-24 (×6): 15 mL via OROMUCOSAL

## 2014-02-20 MED ORDER — AMLODIPINE BESYLATE 5 MG PO TABS
5.0000 mg | ORAL_TABLET | Freq: Every day | ORAL | Status: DC
  Administered 2014-02-21 – 2014-02-23 (×3): 5 mg via ORAL
  Filled 2014-02-20 (×7): qty 1

## 2014-02-20 MED ORDER — KCL IN DEXTROSE-NACL 20-5-0.45 MEQ/L-%-% IV SOLN
INTRAVENOUS | Status: DC
  Administered 2014-02-20 – 2014-02-21 (×3): via INTRAVENOUS
  Administered 2014-02-22: 50 mL/h via INTRAVENOUS
  Administered 2014-02-22 – 2014-02-23 (×2): via INTRAVENOUS
  Filled 2014-02-20 (×7): qty 1000

## 2014-02-20 MED ORDER — HYDRALAZINE HCL 20 MG/ML IJ SOLN
INTRAMUSCULAR | Status: AC
  Filled 2014-02-20: qty 1

## 2014-02-20 NOTE — Progress Notes (Signed)
BP up, manual right arm 190/80, pulse 66 noted one irreg beat.  Dr. Carles Collet notified.  Orders received.

## 2014-02-20 NOTE — Progress Notes (Signed)
PROGRESS NOTE  [name] BMW:[id number] DOB: 03/05/1934 DOA: 02/11/2014 PCP: Wyatt Haste, MD  Brief narrative:  [name] is a 80 y.o. male with h/o DM, HTN, ischemic cardiomyopathy, Narcolepsy, obesity and poor compliance with seeing his PCP and taking his medications.  He presented with dizziness, CP, sob and hematochezia with Hgb of 6.4.  He was admitted in March of this year for CHF and noted to be very non-compliant with medications.He had heme positive stools in the ER with severe anemia. Dr. Collene Mares was consulted and colonoscopy revealed a cecal mass.  General surgery was consulted and R-hemicolectomy was performed on 02/15/14.  Unfortunately, he has developed a post-op ileus which required NG placement.  This was removed on 02/20/14.  During the admission he also developed acute on chronic renal failure prompting d/c of furosemide and lisinopril. Per the records in [facility] it appears that his PCP ordered home health for him but he kicked them out of his home (see note from [name] on 5/29- telephone conversation) and so he was dismissed by them.  Assessment/Plan: GI Bleed  -Patient received 2 units PRBCs 02/11/2014  -Received additional 2 units PRBCs 02/16/2014  -Hemoglobin has remained stable since that time, but some drop noted on 02/20/14  -Denies any further rectal bleeding-last bloody BM on 02/17/14 -02/13/2014 colonoscopy (Dr. 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 Wynelle Cleveland spoke with Dr. Marius Ditch states Gi onc nurse will arrange follow up as outpt with GI oncology  -Status post right hemicolectomy 02/15/2014--Dr. Donne Hazel  Acute blood loss anemia  -Superimposed upon AOCD  -Baseline hemoglobin 9-10  -Hemoglobin remained stable since 2 units PRBCs 02/16/2014  -The patient passed old blood pm 6/26--hemoglobin remained stable  -LDH, haptoglobin,  bilirubin unremarkable  Ischemic cardiomyopathy/Leg edema/lymphedema -11/13/2013 echocardiogram EF 60-65% -judicious fluids -has been treated in the past with ACE wraps- hold diuretics for now  - will need ACE wraps prior to d/c  Post-op ileus -appreciate surgery followup -increase activity Acute on chronic renal failure (CKD stage III)  -Furosemide and lisinopril discontinued  -Baseline creatinine 1.4-1.8  HTN  -BP has been labile -improving but not optimal -Increased carvedilol 12.5 mg twice a day (6/27) -Hydralazine when necessary SBP greater than 180  Diabetes mellitus type 2  -Hemoglobin A1c 7.0--02/12/2014  -Continue NovoLog sliding scale  -Patient takes glipizide in the outpatient setting which has been held  -CBGs controlled Family Communication: Pt at beside  Disposition Plan: SNF--SW is following         Procedures/Studies: Dg Chest Portable 1 View  02/11/2014   CLINICAL DATA:  NEAR SYNCOPE  EXAM: PORTABLE CHEST - 1 VIEW  COMPARISON:  Prior radiograph from 12/08/2013  FINDINGS: Prominent cardiomegaly is stable as compared to prior exam. Mediastinal silhouette within normal limits. Few scattered atherosclerotic calcifications present within the aortic arch.  The lungs are normally inflated. There is mild central perihilar pulmonary vascular congestion without overt pulmonary edema. No definite pleural effusion. No focal infiltrate identified. There is no pneumothorax.  No acute osseous abnormality identified.  IMPRESSION: Stable cardiomegaly with mild central perihilar vascular congestion without overt pulmonary edema.   Electronically Signed   By: Jeannine Boga M.D.   On: 02/11/2014 23:53   Dg Abd Portable 1v  02/19/2014   CLINICAL DATA:  Status post intestinal surgery several days ago. Evaluate dilated small bowel loops.  EXAM: PORTABLE ABDOMEN - 1 VIEW  COMPARISON:  One day prior  FINDINGS: Single supine view of the abdomen and pelvis. Midline laparotomy clips.  Gaseous distention of large and small bowel loops is moderate. Example small bowel loop measures 4.0 cm in the left side of the abdomen, similar. Paucity of rectosigmoid gas. Moderate amount of gas throughout the colon.  No pneumatosis or free intraperitoneal air.  IMPRESSION: Similar large and small bowel distention, likely representing postoperative adynamic ileus. No free intraperitoneal air or other acute complication.   Electronically Signed   By: Abigail Miyamoto M.D.   On: 02/19/2014 16:14   Dg Abd Portable 1v  02/18/2014   CLINICAL DATA:  Ileus.  EXAM: PORTABLE ABDOMEN - 1 VIEW  COMPARISON:  Abdominal series 12 /08/2012.  FINDINGS: Distended loops of small and large bowel noted consistent adynamic ileus. No free air identified. Surgical staples are noted the abdomen. Degenerative changes lumbar spine and both hips.  IMPRESSION: Distended loops of small and large bowel consistent with adynamic ileus.   Electronically Signed   By: Marcello Moores  Register   On: 02/18/2014 10:30         Subjective: Patient states the pain is abdomen is controlled. Denies fevers, chills, chest pain, breath, nausea, vomiting, diarrhea. He is passing some flatus. No bowel movement. He feels hungry and wants to eat.  Objective: Filed Vitals:   02/20/14 0800 02/20/14 1012 02/20/14 1109 02/20/14 1342  BP:  158/57  187/70  Pulse:  64  62  Temp:  98 F (36.7 C)  98.2 F (36.8 C)  TempSrc:  Oral  Oral  Resp: 12 12 17 16   Height:      Weight:      SpO2: 100% 100% 99% 99%    Intake/Output Summary (Last 24 hours) at 02/20/14 1615 Last data filed at 02/20/14 1418  Gross per 24 hour  Intake  507.5 ml  Output   1100 ml  Net -592.5 ml   Weight change: -1.9 kg (-4 lb 3 oz) Exam:   General:  Pt is alert, follows commands appropriately, not in acute distress  HEENT: No icterus, No thrush,  [place]/AT  Cardiovascular: RRR, S1/S2, no rubs, no gallops  Respiratory: CTA bilaterally, no wheezing, no crackles, no  rhonchi  Abdomen: Soft/dimished BS, mild diffuse tenderness without any peritoneal signs, non distended  Extremities: 2+LE edema, No lymphangitis, No petechiae, No rashes, no synovitis  Data Reviewed: Basic Metabolic Panel:  Recent Labs Lab 02/15/14 0500 02/16/14 0415 02/17/14 0452 02/18/14 0421 02/20/14 0638  NA 139 140 138 138 139  K 3.8 4.5 4.1 4.4 4.1  CL 102 101 101 102 101  CO2 26 22 23 22 24   GLUCOSE 131* 162* 127* 129* 97  BUN 12 17 19 18 17   CREATININE 1.47* 2.05* 1.94* 1.66* 1.31  CALCIUM 8.4 7.9* 7.9* 8.2* 8.4   Liver Function Tests:  Recent Labs Lab 02/17/14 1815  BILITOT 0.4   No results found for this basename: LIPASE, AMYLASE,  in the last 168 hours No results found for this basename: AMMONIA,  in the last 168 hours CBC:  Recent Labs Lab 02/16/14 1455  02/17/14 0452 02/17/14 1730 02/18/14 0421 02/18/14 1520 02/19/14 0147 02/20/14 0638  WBC 14.1*  --  10.4  --  7.7  --  5.7 3.2*  HGB 7.1*  < > 6.7* 8.2* 8.2* 8.7* 8.9* 7.6*  HCT 22.2*  < > 20.9* 24.8* 25.8* 27.4* 27.9* 24.0*  MCV 81.6  --  82.0  --  83.0  --  83.3 83.3  PLT 225  --  195  --  213  --  216 207  < > = values in this interval not displayed. Cardiac Enzymes: No results found for this basename: CKTOTAL, CKMB, CKMBINDEX, TROPONINI,  in the last 168 hours BNP: No components found with this basename: POCBNP,  CBG:  Recent Labs Lab 02/19/14 1955 02/20/14 0003 02/20/14 0344 02/20/14 0747 02/20/14 1218  GLUCAP 104* 102* 100* 93 111*    Recent Results (from the past 240 hour(s))  MRSA PCR SCREENING     Status: None   Collection Time    02/12/14  3:07 AM      Result Value Ref Range Status   MRSA by PCR NEGATIVE  NEGATIVE Final   Comment:            The GeneXpert MRSA Assay (FDA     approved for NASAL specimens     only), is one component of a     comprehensive MRSA colonization     surveillance program. It is not     intended to diagnose MRSA     infection nor to guide or      monitor treatment for     MRSA infections.  SURGICAL PCR SCREEN     Status: None   Collection Time    02/15/14  5:11 AM      Result Value Ref Range Status   MRSA, PCR NEGATIVE  NEGATIVE Final   Staphylococcus aureus NEGATIVE  NEGATIVE Final   Comment:            The Xpert SA Assay (FDA     approved for NASAL specimens     in patients over 31 years of age),     is one component of     a comprehensive surveillance     program.  Test performance has     been validated by [employer] for patients greater     than or equal to 55 year old.     It is not intended     to diagnose infection nor to     guide or monitor treatment.     Scheduled Meds: . antiseptic oral rinse  15 mL Mouth Rinse q12n4p  . carvedilol  12.5 mg Oral BID WC  . chlorhexidine  15 mL Mouth Rinse BID  . heparin subcutaneous  5,000 Units Subcutaneous 3 times per day  . insulin aspart  0-9 Units Subcutaneous 6 times per day  . morphine   Intravenous 6 times per day  . pantoprazole (PROTONIX) IV  40 mg Intravenous Q24H   Continuous Infusions: . dextrose 5 % and 0.45 % NaCl with KCl 20 mEq/L 75 mL/hr at 02/20/14 1028     TAT, DAVID, DO  [facility] Pager [phone]  If 7PM-7AM, please contact night-coverage [email or web address] Password [email or web address] 02/20/2014, 4:15 PM   LOS: 9 days

## 2014-02-20 NOTE — Progress Notes (Signed)
Pt refused his 1400 heparin SQ shot, explained to him what it is for but he did not want it.

## 2014-02-20 NOTE — Progress Notes (Signed)
Patient ID: [name], male   DOB: [birth year], 80 y.o.   MRN: [id number] 5 Days Post-Op  Subjective: Wanting to eat.  Denies passing any more blood from his rectum.  Seems to be tolerating sips of clears.  Passing flatus, no BM.  Ambulating  Objective: Vital signs in last 24 hours: Temp:  [97.8 F (36.6 C)-98.7 F (37.1 C)] 98.2 F (36.8 C) (06/28 0456) Pulse Rate:  [67-80] 80 (06/28 0456) Resp:  [8-14] 12 (06/28 0800) BP: (133-178)/(54-67) 152/56 mmHg (06/28 0456) SpO2:  [98 %-100 %] 100 % (06/28 0800) Weight:  [246 lb 4.1 oz (111.7 kg)] 246 lb 4.1 oz (111.7 kg) (06/28 0507) Last BM Date: 02/18/14  Intake/Output from previous day: 06/27 0701 - 06/28 0700 In: 220 [P.O.:220] Out: 1150 [Urine:1150] Intake/Output this shift:    PE: Gen: NAD Abd: soft, distended.  Few BS, incision c/d/i with staples present   Lab Results:   Recent Labs  02/19/14 0147 02/20/14 0638  WBC 5.7 3.2*  HGB 8.9* 7.6*  HCT 27.9* 24.0*  PLT 216 207   BMET  Recent Labs  02/18/14 0421 02/20/14 0638  NA 138 139  K 4.4 4.1  CL 102 101  CO2 22 24  GLUCOSE 129* 97  BUN 18 17  CREATININE 1.66* 1.31  CALCIUM 8.2* 8.4   PT/INR No results found for this basename: LABPROT, INR,  in the last 72 hours CMP     Component Value Date/Time   NA 139 02/20/2014 0638   K 4.1 02/20/2014 0638   CL 101 02/20/2014 0638   CO2 24 02/20/2014 0638   GLUCOSE 97 02/20/2014 0638   BUN 17 02/20/2014 0638   CREATININE 1.31 02/20/2014 0638   CREATININE 1.56* 10/07/2013 1141   CALCIUM 8.4 02/20/2014 0638   PROT 6.5 02/11/2014 2256   ALBUMIN 3.0* 02/11/2014 2256   AST 8 02/11/2014 2256   ALT <5 02/11/2014 2256   ALKPHOS 57 02/11/2014 2256   BILITOT 0.4 02/17/2014 1815   GFRNONAA 50* 02/20/2014 0638   GFRAA 58* 02/20/2014 0638   Lipase     Component Value Date/Time   LIPASE 49 07/26/2013 2246       Studies/Results: Dg Abd Portable 1v  02/19/2014   CLINICAL DATA:  Status post intestinal surgery several days  ago. Evaluate dilated small bowel loops.  EXAM: PORTABLE ABDOMEN - 1 VIEW  COMPARISON:  One day prior  FINDINGS: Single supine view of the abdomen and pelvis. Midline laparotomy clips. Gaseous distention of large and small bowel loops is moderate. Example small bowel loop measures 4.0 cm in the left side of the abdomen, similar. Paucity of rectosigmoid gas. Moderate amount of gas throughout the colon.  No pneumatosis or free intraperitoneal air.  IMPRESSION: Similar large and small bowel distention, likely representing postoperative adynamic ileus. No free intraperitoneal air or other acute complication.   Electronically Signed   By: Abigail Miyamoto M.D.   On: 02/19/2014 16:14   Dg Abd Portable 1v  02/18/2014   CLINICAL DATA:  Ileus.  EXAM: PORTABLE ABDOMEN - 1 VIEW  COMPARISON:  Abdominal series 12 /08/2012.  FINDINGS: Distended loops of small and large bowel noted consistent adynamic ileus. No free air identified. Surgical staples are noted the abdomen. Degenerative changes lumbar spine and both hips.  IMPRESSION: Distended loops of small and large bowel consistent with adynamic ileus.   Electronically Signed   By: Marcello Moores  Register   On: 02/18/2014 10:30    Anti-infectives: Anti-infectives   Start  Dose/Rate Route Frequency Ordered Stop   02/15/14 1400  cefoTEtan (CEFOTAN) 2 g in dextrose 5 % 50 mL IVPB     2 g 100 mL/hr over 30 Minutes Intravenous 3 times per day 02/15/14 1225 02/16/14 1359   02/15/14 0945  cefoTEtan (CEFOTAN) 2 g in dextrose 5 % 50 mL IVPB  Status:  Discontinued     2 g 100 mL/hr over 30 Minutes Intravenous To Surgery 02/15/14 0939 02/15/14 1215   02/15/14 0700  cefoTEtan (CEFOTAN) 2 g in dextrose 5 % 50 mL IVPB  Status:  Discontinued     2 g 100 mL/hr over 30 Minutes Intravenous  Once 02/15/14 0655 02/15/14 1005   02/15/14 0600  [MAR Hold]  cefOXitin (MEFOXIN) 2 g in dextrose 5 % 50 mL IVPB  Status:  Discontinued     (On MAR Hold since 02/15/14 0621)  Comments:  Pharmacy may  adjust dosing strength, interval, or rate of medication as needed for optimal therapy for the patient Send with patient on call to the OR.  Anesthesia to complete antibiotic administration <62min prior to incision per [facility].   2 g 100 mL/hr over 30 Minutes Intravenous On call to O.R. 02/14/14 1207 02/15/14 0655       Assessment/Plan   1. POD 4 s/p lap assisted right colectomy  2. Acute blood loss anemia 3. Post op ileus  Plan: 1. SW consult may be appropriate for this patient as he has a lot of concerns about his disposition. 2. Hgb down somewhat, no rectal bleeding, will monitor 3. Cont PT, ambulation 4. Cont sips of clears until distention resolves   LOS: 9 days    THOMAS, ALICIA C. 3/78/5885, 0:27 AM

## 2014-02-21 ENCOUNTER — Encounter: Payer: Self-pay | Admitting: Family Medicine

## 2014-02-21 LAB — BASIC METABOLIC PANEL
BUN: 13 mg/dL (ref 6–23)
CALCIUM: 8 mg/dL — AB (ref 8.4–10.5)
CO2: 25 meq/L (ref 19–32)
CREATININE: 1.17 mg/dL (ref 0.50–1.35)
Chloride: 102 mEq/L (ref 96–112)
GFR calc Af Amer: 67 mL/min — ABNORMAL LOW (ref >90)
GFR calc non Af Amer: 58 mL/min — ABNORMAL LOW (ref >90)
GLUCOSE: 132 mg/dL — AB (ref 70–99)
Potassium: 4 mEq/L (ref 3.7–5.3)
SODIUM: 138 meq/L (ref 137–147)

## 2014-02-21 LAB — CBC
HEMATOCRIT: 22 % — AB (ref 39.0–52.0)
Hemoglobin: 7.1 g/dL — ABNORMAL LOW (ref 13.0–17.0)
MCH: 26.5 pg (ref 26.0–34.0)
MCHC: 32.3 g/dL (ref 30.0–36.0)
MCV: 82.1 fL (ref 78.0–100.0)
Platelets: 183 10*3/uL (ref 150–400)
RBC: 2.68 MIL/uL — ABNORMAL LOW (ref 4.22–5.81)
RDW: 16.5 % — ABNORMAL HIGH (ref 11.5–15.5)
WBC: 2.7 10*3/uL — ABNORMAL LOW (ref 4.0–10.5)

## 2014-02-21 LAB — GLUCOSE, CAPILLARY
GLUCOSE-CAPILLARY: 135 mg/dL — AB (ref 70–99)
Glucose-Capillary: 133 mg/dL — ABNORMAL HIGH (ref 70–99)
Glucose-Capillary: 133 mg/dL — ABNORMAL HIGH (ref 70–99)
Glucose-Capillary: 140 mg/dL — ABNORMAL HIGH (ref 70–99)
Glucose-Capillary: 126 mg/dL — ABNORMAL HIGH (ref 70–99)
Glucose-Capillary: 130 mg/dL — ABNORMAL HIGH (ref 70–99)

## 2014-02-21 MED ORDER — OXYCODONE-ACETAMINOPHEN 5-325 MG PO TABS: 1.0000 | ORAL_TABLET | ORAL | Status: DC | PRN

## 2014-02-21 MED ORDER — ACETAMINOPHEN 325 MG PO TABS
650.0000 mg | ORAL_TABLET | Freq: Four times a day (QID) | ORAL | Status: DC | PRN
  Administered 2014-02-24: 650 mg via ORAL
  Filled 2014-02-21: qty 2

## 2014-02-21 MED ORDER — HYDROMORPHONE HCL PF 1 MG/ML IJ SOLN: 0.5000 mg | INTRAMUSCULAR | Status: DC | PRN

## 2014-02-21 NOTE — Progress Notes (Signed)
PT Cancellation Note  Patient Details Name: [name] MRN: [id number] DOB: February 18, 1934   Cancelled Treatment:    Reason Eval/Treat Not Completed: Medical issues which prohibited therapy. Patient Hgb 7.1. Patient stated he felt incredibly tired and was waiting on broth. Patient declined ambulation at this time. Will follow up as appropriate   Robinette, Tonia Brooms 02/21/2014, 3:11 PM

## 2014-02-21 NOTE — Clinical Social Work Note (Signed)
Clinical Social Worker continuing to follow patient and family for support and discharge planning needs.  CSW spoke with [facility] who states that patient has a bed available this week if needed at discharge.  Per chart, patient preference is for [facility].  CSW remains available for support and to facilitate patient discharge needs once medically stable.  Barbette Or, [place]

## 2014-02-21 NOTE — Progress Notes (Signed)
Pt very angry and stated he didn't need the pain med or the nose sensor/ Pt pulled sensor out of nose and threw on bed. Unable to get patient to allow the placement of pca sensor back in nose/ Nurse disconnected pca module.

## 2014-02-21 NOTE — Progress Notes (Signed)
PROGRESS NOTE  [name] WGN:[id number] DOB: [birth year] DOA: 02/11/2014 PCP: No primary provider on file.  Brief narrative:  [name] is a 80 y.o. male with h/o DM, HTN, ischemic cardiomyopathy, Narcolepsy, obesity and poor compliance with seeing his PCP and taking his medications. He presented with dizziness, CP, sob and hematochezia with Hgb of 6.4. He was admitted in March of this year for CHF and noted to be very non-compliant with medications.He had heme positive stools in the ER with severe anemia. Dr. Collene Mares was consulted and colonoscopy revealed a cecal mass. General surgery was consulted and R-hemicolectomy was performed on 02/15/14. Unfortunately, he has developed a post-op ileus which required NG placement. This was removed on 02/20/14. During the admission he also developed acute on chronic renal failure prompting d/c of furosemide and lisinopril.  Per the records in [facility] it appears that his PCP ordered home health for him but he kicked them out of his home (see note from [name] on 5/29- telephone conversation) and so he was dismissed by them.  Assessment/Plan:  GI Bleed  -Patient received 2 units PRBCs 02/11/2014  -Received additional 2 units PRBCs 02/16/2014  -Hemoglobin has remained stable since that time, but some drop noted on 02/20/14  -Denies any further rectal bleeding-last bloody BM on 02/17/14  -02/13/2014 colonoscopy (Dr. 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 Wynelle Cleveland spoke with Dr. Marius Ditch states Gi onc nurse will arrange follow up as outpt with GI oncology  -Status post right hemicolectomy 02/15/2014--Dr. Donne Hazel  Acute blood loss anemia  -Superimposed upon AOCD  -Baseline hemoglobin 9-10  -Hemoglobin remained stable since 2 units PRBCs 02/16/2014  -The patient passed old blood pm 6/26--no more hematochezia since then -Hemoglobin  trending down partly due to dilution effect from IV fluids -LDH, haptoglobin, bilirubin unremarkable  Ischemic cardiomyopathy/Leg edema/lymphedema  -11/13/2013 echocardiogram EF 60-65%  -judicious fluids  -has been treated in the past with ACE wraps- hold diuretics for now  - will need ACE wraps prior to d/c  -decrease IVF to 50cc/hr Post-op ileus  -appreciate surgery followup  -increase activity  02/21/2014 Started on clear liquids today--passing flatus, but still no bowel movement  -PCA pump discontinued Acute on chronic renal failure (CKD stage III)  -Furosemide and lisinopril discontinued  -Previous Baseline creatinine 1.4-1.8  -02/21/2014 serum creatinine 1.17 HTN  -BP has been labile-patient has also intermittently refused his antihypertensive medications depending upon his mood  -improving but not optimal  -Increased carvedilol 12.5 mg twice a day (6/27)  -Hydralazine when necessary SBP greater than 180  -Amlodipine 5 mg daily added  Diabetes mellitus type 2  -Hemoglobin A1c 7.0--02/12/2014  -Continue NovoLog sliding scale  -Patient takes glipizide in the outpatient setting which has been held  -CBGs controlled  -Patient can be restarted on glipizide after discharge Family Communication: Pt at beside  Disposition Plan: SNF--SW is following      Procedures/Studies: Dg Chest Portable 1 View  02/11/2014   CLINICAL DATA:  NEAR SYNCOPE  EXAM: PORTABLE CHEST - 1 VIEW  COMPARISON:  Prior radiograph from 12/08/2013  FINDINGS: Prominent cardiomegaly is stable as compared to prior exam. Mediastinal silhouette within normal limits. Few scattered atherosclerotic calcifications present within the aortic arch.  The lungs are normally inflated. There is mild central perihilar pulmonary vascular congestion without overt pulmonary edema. No definite pleural effusion. No focal  infiltrate identified. There is no pneumothorax.  No acute osseous abnormality identified.  IMPRESSION: Stable  cardiomegaly with mild central perihilar vascular congestion without overt pulmonary edema.   Electronically Signed   By: Jeannine Boga M.D.   On: 02/11/2014 23:53   Dg Abd Portable 1v  02/19/2014   CLINICAL DATA:  Status post intestinal surgery several days ago. Evaluate dilated small bowel loops.  EXAM: PORTABLE ABDOMEN - 1 VIEW  COMPARISON:  One day prior  FINDINGS: Single supine view of the abdomen and pelvis. Midline laparotomy clips. Gaseous distention of large and small bowel loops is moderate. Example small bowel loop measures 4.0 cm in the left side of the abdomen, similar. Paucity of rectosigmoid gas. Moderate amount of gas throughout the colon.  No pneumatosis or free intraperitoneal air.  IMPRESSION: Similar large and small bowel distention, likely representing postoperative adynamic ileus. No free intraperitoneal air or other acute complication.   Electronically Signed   By: Abigail Miyamoto M.D.   On: 02/19/2014 16:14   Dg Abd Portable 1v  02/18/2014   CLINICAL DATA:  Ileus.  EXAM: PORTABLE ABDOMEN - 1 VIEW  COMPARISON:  Abdominal series 12 /08/2012.  FINDINGS: Distended loops of small and large bowel noted consistent adynamic ileus. No free air identified. Surgical staples are noted the abdomen. Degenerative changes lumbar spine and both hips.  IMPRESSION: Distended loops of small and large bowel consistent with adynamic ileus.   Electronically Signed   By: Marcello Moores  Register   On: 02/18/2014 10:30         Subjective: Patient remains angry about his entire medical situation. He demands to eat and refuses PT, labs, medications intermittently because of his desire to eat. He is passing flatus today but no bloody bowel movements in the last several days. No vomiting. No chest pain or shortness of breath. No fevers, chills, dysuria, hematuria. No headaches.   Objective: Filed Vitals:   02/21/14 0511 02/21/14 1018 02/21/14 1408 02/21/14 1639  BP: 156/61 150/54 180/71 158/62  Pulse: 72  66 65   Temp: 98.1 F (36.7 C) 98.2 F (36.8 C) 98.1 F (36.7 C)   TempSrc: Oral Oral Oral   Resp: 15 14 14    Height:      Weight: 114.5 kg (252 lb 6.8 oz)     SpO2: 96% 95% 100%     Intake/Output Summary (Last 24 hours) at 02/21/14 1930 Last data filed at 02/21/14 1927  Gross per 24 hour  Intake 1482.5 ml  Output   1300 ml  Net  182.5 ml   Weight change: 2.8 kg (6 lb 2.8 oz) Exam:   General:  Pt is alert, follows commands appropriately, not in acute distress  HEENT: No icterus, No thrush, [place]/AT  Cardiovascular: RRR, S1/S2, no rubs, no gallops  Respiratory: CTA bilaterally, no wheezing, no crackles, no rhonchi  Abdomen: Soft/+BS, diffusely tender without peritoneal signs, non distended, no guarding  Extremities: 2+ edema, No lymphangitis, No petechiae, No rashes, no synovitis  Data Reviewed: Basic Metabolic Panel:  Recent Labs Lab 02/16/14 0415 02/17/14 0452 02/18/14 0421 02/20/14 0638 02/21/14 0400  NA 140 138 138 139 138  K 4.5 4.1 4.4 4.1 4.0  CL 101 101 102 101 102  CO2 22 23 22 24 25   GLUCOSE 162* 127* 129* 97 132*  BUN 17 19 18 17 13   CREATININE 2.05* 1.94* 1.66* 1.31 1.17  CALCIUM 7.9* 7.9* 8.2* 8.4 8.0*   Liver Function Tests:  Recent Labs Lab 02/17/14 1815  BILITOT 0.4   No results found for this basename: LIPASE, AMYLASE,  in the last 168 hours No results found for this basename: AMMONIA,  in the last 168 hours CBC:  Recent Labs Lab 02/17/14 0452  02/18/14 0421 02/18/14 1520 02/19/14 0147 02/20/14 0638 02/21/14 0400  WBC 10.4  --  7.7  --  5.7 3.2* 2.7*  HGB 6.7*  < > 8.2* 8.7* 8.9* 7.6* 7.1*  HCT 20.9*  < > 25.8* 27.4* 27.9* 24.0* 22.0*  MCV 82.0  --  83.0  --  83.3 83.3 82.1  PLT 195  --  213  --  216 207 183  < > = values in this interval not displayed. Cardiac Enzymes: No results found for this basename: CKTOTAL, CKMB, CKMBINDEX, TROPONINI,  in the last 168 hours BNP: No components found with this basename: POCBNP,   CBG:  Recent Labs Lab 02/21/14 0510 02/21/14 0740 02/21/14 1246 02/21/14 1626 02/21/14 1720  GLUCAP 135* 133* 133* 140* 126*    Recent Results (from the past 240 hour(s))  MRSA PCR SCREENING     Status: None   Collection Time    02/12/14  3:07 AM      Result Value Ref Range Status   MRSA by PCR NEGATIVE  NEGATIVE Final   Comment:            The GeneXpert MRSA Assay (FDA     approved for NASAL specimens     only), is one component of a     comprehensive MRSA colonization     surveillance program. It is not     intended to diagnose MRSA     infection nor to guide or     monitor treatment for     MRSA infections.  SURGICAL PCR SCREEN     Status: None   Collection Time    02/15/14  5:11 AM      Result Value Ref Range Status   MRSA, PCR NEGATIVE  NEGATIVE Final   Staphylococcus aureus NEGATIVE  NEGATIVE Final   Comment:            The Xpert SA Assay (FDA     approved for NASAL specimens     in patients over 46 years of age),     is one component of     a comprehensive surveillance     program.  Test performance has     been validated by [employer] for patients greater     than or equal to 51 year old.     It is not intended     to diagnose infection nor to     guide or monitor treatment.     Scheduled Meds: . amLODipine  5 mg Oral QHS  . antiseptic oral rinse  15 mL Mouth Rinse q12n4p  . carvedilol  12.5 mg Oral BID WC  . chlorhexidine  15 mL Mouth Rinse BID  . heparin subcutaneous  5,000 Units Subcutaneous 3 times per day  . insulin aspart  0-9 Units Subcutaneous 6 times per day  . pantoprazole (PROTONIX) IV  40 mg Intravenous Q24H   Continuous Infusions: . dextrose 5 % and 0.45 % NaCl with KCl 20 mEq/L 75 mL/hr at 02/21/14 1432     Poetry Cerro, DO  [facility] Pager [phone]  If 7PM-7AM, please contact night-coverage [email or web address] Password [email or web address] 02/21/2014, 7:30 PM   LOS: 10 days

## 2014-02-21 NOTE — Progress Notes (Signed)
6 Days Post-Op  Subjective: Refusing PCA, Heparin none for the last 24 hours, insulin, BP meds.  Nothing for pain.  Says he doesn't want to go home with his daughter.  Still angry about all that has happened to him since he retired, including his wife's illness and his own illness.  He says he has had bloody stools each day.  He does not really listen to explanations of why we are doing various treatment.  Objective: Vital signs in last 24 hours: Temp:  [98 F (36.7 C)-98.4 F (36.9 C)] 98.1 F (36.7 C) (06/29 0511) Pulse Rate:  [62-72] 72 (06/29 0511) Resp:  [10-17] 15 (06/29 0511) BP: (145-198)/(48-80) 156/61 mmHg (06/29 0511) SpO2:  [95 %-100 %] 96 % (06/29 0511) FiO2 (%):  [99 %] 99 % (06/28 1109) Weight:  [114.5 kg (252 lb 6.8 oz)] 114.5 kg (252 lb 6.8 oz) (06/29 0511) Last BM Date: 02/18/14 Voiding well, may be a little dry looking at I/O. Afebrile, HR OK, BMP OK, he is still very anemic BNP on admit was 2400 on admit. Intake/Output from previous day: 06/28 0701 - 06/29 0700 In: 1163.8 [P.O.:50; I.V.:1113.8] Out: 1175 [Urine:1175] Intake/Output this shift:    General appearance: alert, uncooperative and He sounds like he understands what is going on, but I am not sure. Resp: clear to auscultation bilaterally and anterior exam GI: soft, he says he does not have pain,  he says he has gas.  incisions/ports look fine.   some edema  Lab Results:   Recent Labs  02/20/14 0638 02/21/14 0400  WBC 3.2* 2.7*  HGB 7.6* 7.1*  HCT 24.0* 22.0*  PLT 207 183    BMET  Recent Labs  02/20/14 0638 02/21/14 0400  NA 139 138  K 4.1 4.0  CL 101 102  CO2 24 25  GLUCOSE 97 132*  BUN 17 13  CREATININE 1.31 1.17  CALCIUM 8.4 8.0*   PT/INR No results found for this basename: LABPROT, INR,  in the last 72 hours   Recent Labs Lab 02/17/14 1815  BILITOT 0.4     Lipase     Component Value Date/Time   LIPASE 49 07/26/2013 2246     Studies/Results: Dg Abd Portable  1v  02/19/2014   CLINICAL DATA:  Status post intestinal surgery several days ago. Evaluate dilated small bowel loops.  EXAM: PORTABLE ABDOMEN - 1 VIEW  COMPARISON:  One day prior  FINDINGS: Single supine view of the abdomen and pelvis. Midline laparotomy clips. Gaseous distention of large and small bowel loops is moderate. Example small bowel loop measures 4.0 cm in the left side of the abdomen, similar. Paucity of rectosigmoid gas. Moderate amount of gas throughout the colon.  No pneumatosis or free intraperitoneal air.  IMPRESSION: Similar large and small bowel distention, likely representing postoperative adynamic ileus. No free intraperitoneal air or other acute complication.   Electronically Signed   By: Abigail Miyamoto M.D.   On: 02/19/2014 16:14    Medications: . amLODipine  5 mg Oral QHS  . antiseptic oral rinse  15 mL Mouth Rinse q12n4p  . carvedilol  12.5 mg Oral BID WC  . chlorhexidine  15 mL Mouth Rinse BID  . heparin subcutaneous  5,000 Units Subcutaneous 3 times per day  . insulin aspart  0-9 Units Subcutaneous 6 times per day  . morphine   Intravenous 6 times per day  . pantoprazole (PROTONIX) IV  40 mg Intravenous Q24H    Assessment/Plan bleeding cecal mass;  s/p laparoscopic assisted right colectomy, Surgeon: Dr Serita Grammes. 02/15/14 Transfused 4 units PRBC/anemia Hx of CM/CHF with last EF 60-65% (11/13/13) BNP on admit 2409 Reports refusing to take med for 3 months prior to admit Post op ileus CRI stage III renal disease Hypertension AODM Heparin/SCD for DVT -  He is refusing   Plan:  It is almost impossible to logically discus what we are doing.  I am going to try him on some sips and chips.   He has a walker, social services and PT are seeing him already, I will get OT to see also.  If he does well perhaps we can increase his diet later today or in AM.  He is refusing PCA and it has been taken away.  LOS: 10 days    JENNINGS,WILLARD 02/21/2014

## 2014-02-21 NOTE — Progress Notes (Signed)
02/21/14 1100  Clinical Encounter Type  Visited With Patient  Visit Type Initial  Spiritual Encounters  Spiritual Needs Emotional  Stress Factors  Patient Stress Factors Family relationships;Financial concerns;Health changes  Chaplain visited with pt while making rounds. Pt expressly noted that "doctors and nurses keep telling me different information". He is frustrated with feeling like he's not informed. He noted that he is "too strong to be in his current condition". Pt desires continuity and coherence concerning his care.  [name] 11:36 AM

## 2014-02-21 NOTE — Progress Notes (Signed)
Tolerating sips of clears. Incisions OK. Patient examined and I agree with the assessment and plan  Georganna Skeans, MD, MPH, FACS Trauma: [phone] General Surgery: [phone]  02/21/2014 2:54 PM

## 2014-02-22 DIAGNOSIS — D62 Acute posthemorrhagic anemia: Secondary | ICD-10-CM

## 2014-02-22 LAB — CBC
HEMATOCRIT: 24.9 % — AB (ref 39.0–52.0)
Hemoglobin: 8.1 g/dL — ABNORMAL LOW (ref 13.0–17.0)
MCH: 27.1 pg (ref 26.0–34.0)
MCHC: 32.5 g/dL (ref 30.0–36.0)
MCV: 83.3 fL (ref 78.0–100.0)
Platelets: 195 10*3/uL (ref 150–400)
RBC: 2.99 MIL/uL — AB (ref 4.22–5.81)
RDW: 16.6 % — ABNORMAL HIGH (ref 11.5–15.5)
WBC: 3 10*3/uL — ABNORMAL LOW (ref 4.0–10.5)

## 2014-02-22 LAB — BASIC METABOLIC PANEL
BUN: 8 mg/dL (ref 6–23)
CALCIUM: 8.1 mg/dL — AB (ref 8.4–10.5)
CO2: 27 meq/L (ref 19–32)
Chloride: 105 mEq/L (ref 96–112)
Creatinine, Ser: 1.19 mg/dL (ref 0.50–1.35)
GFR calc Af Amer: 66 mL/min — ABNORMAL LOW (ref >90)
GFR calc non Af Amer: 57 mL/min — ABNORMAL LOW (ref >90)
GLUCOSE: 124 mg/dL — AB (ref 70–99)
Potassium: 4.1 mEq/L (ref 3.7–5.3)
Sodium: 141 mEq/L (ref 137–147)

## 2014-02-22 LAB — GLUCOSE, CAPILLARY
GLUCOSE-CAPILLARY: 126 mg/dL — AB (ref 70–99)
Glucose-Capillary: 122 mg/dL — ABNORMAL HIGH (ref 70–99)
Glucose-Capillary: 123 mg/dL — ABNORMAL HIGH (ref 70–99)
Glucose-Capillary: 125 mg/dL — ABNORMAL HIGH (ref 70–99)
Glucose-Capillary: 130 mg/dL — ABNORMAL HIGH (ref 70–99)
Glucose-Capillary: 174 mg/dL — ABNORMAL HIGH (ref 70–99)

## 2014-02-22 MED ORDER — ONDANSETRON HCL 4 MG/2ML IJ SOLN
4.0000 mg | Freq: Four times a day (QID) | INTRAMUSCULAR | Status: DC | PRN
  Administered 2014-02-22: 4 mg via INTRAVENOUS
  Filled 2014-02-22: qty 2

## 2014-02-22 NOTE — Progress Notes (Signed)
Just had a BM this AM. He describes it as hard. Dark per RN. Start clears Patient examined and I agree with the assessment and plan  Georganna Skeans, MD, MPH, FACS Trauma: [phone] General Surgery: [phone]  02/22/2014 8:51 AM .

## 2014-02-22 NOTE — Progress Notes (Signed)
Vomited 2x.  Called MD for Zofran IV order.

## 2014-02-22 NOTE — Progress Notes (Signed)
PROGRESS NOTE  [name] MOQ:[id number] DOB: November 05, 1933 DOA: 02/11/2014 PCP: No primary provider on file.   Brief narrative:  [name] is a 80 y.o. male with h/o DM, HTN, ischemic cardiomyopathy, Narcolepsy, obesity and poor compliance with seeing his PCP and taking his medications. He presented with dizziness, CP, sob and hematochezia with Hgb of 6.4. He was admitted in March of this year for CHF and noted to be very non-compliant with medications.He had heme positive stools in the ER with severe anemia. Dr. Collene Mares was consulted and colonoscopy revealed a cecal mass. General surgery was consulted and R-hemicolectomy was performed on 02/15/14.  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   Unfortunately, he has developed a post-op ileus which required NG placement. This was removed on 02/20/14. During the admission he also developed acute on chronic renal failure prompting d/c of furosemide and lisinopril.  IVF given judiciously with improvement of renal function.  Per the records in [facility] it appears that his PCP ordered home health for him but he kicked them out of his home (see note from [name] on 5/29- telephone conversation) and so he was dismissed by them.  Assessment/Plan:  GI Bleed  -Patient received 2 units PRBCs 02/11/2014  -Received additional 2 units PRBCs 02/16/2014  -Hemoglobin has remained stable since that time -Denies any further rectal bleeding-last bloody BM on 02/17/14  -02/13/2014 colonoscopy (Dr. Dalphine Handing mass  -Continue Protonix IV until able to tolerate by mouth   Acute blood loss anemia  -Superimposed upon AOCD  -Baseline hemoglobin 9-10  -Hemoglobin remained stable since 2 units PRBCs 02/16/2014  -The patient passed old blood pm 6/26 and 6/30--thought to be old blood -Hemoglobin trending down partly due to dilution effect from IV fluids  -LDH, haptoglobin, bilirubin unremarkable  Ischemic cardiomyopathy/Leg edema/lymphedema  -11/13/2013 echocardiogram EF  60-65%  -judicious fluids  -has been treated in the past with ACE wraps- hold diuretics for now  - will need ACE wraps prior to d/c  -decrease IVF to 50cc/hr  Post-op ileus  -vomited again today with clears -now back to npo with sips, but had BM with old blood today -appreciate surgery followup  -increase activity  -PCA pump discontinued  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 Wynelle Cleveland spoke with Dr. Marius Ditch states Gi onc nurse will arrange follow up as outpt with GI oncology  -Status post right hemicolectomy 02/15/2014--Dr. Donne Hazel  Acute on chronic renal failure (CKD stage III)  -Furosemide and lisinopril discontinued  -Previous Baseline creatinine 1.4-1.8  -02/21/2014 serum creatinine 1.17  HTN  -BP has been labile-patient has also intermittently refused his antihypertensive medications depending upon his mood  -improving but not optimal  -Increased carvedilol 12.5 mg twice a day (6/27)  -Hydralazine when necessary SBP greater than 180  -Amlodipine 5 mg daily added  Diabetes mellitus type 2  -Hemoglobin A1c 7.0--02/12/2014  -Continue NovoLog sliding scale  -Patient takes glipizide in the outpatient setting which has been held  -CBGs controlled  -Patient can be restarted on glipizide after discharge  Family Communication: Pt at beside  Disposition Plan: SNF--SW is following       Procedures/Studies: Dg Chest Portable 1 View  02/11/2014   CLINICAL DATA:  NEAR SYNCOPE  EXAM: PORTABLE CHEST - 1 VIEW  COMPARISON:  Prior radiograph from 12/08/2013  FINDINGS: Prominent cardiomegaly is stable as compared to prior exam. Mediastinal silhouette within normal limits. Few scattered atherosclerotic calcifications present within the aortic arch.  The lungs  are normally inflated. There is mild central perihilar pulmonary vascular congestion without overt pulmonary edema. No definite pleural effusion. No focal infiltrate identified. There is no  pneumothorax.  No acute osseous abnormality identified.  IMPRESSION: Stable cardiomegaly with mild central perihilar vascular congestion without overt pulmonary edema.   Electronically Signed   By: Jeannine Boga M.D.   On: 02/11/2014 23:53   Dg Abd Portable 1v  02/19/2014   CLINICAL DATA:  Status post intestinal surgery several days ago. Evaluate dilated small bowel loops.  EXAM: PORTABLE ABDOMEN - 1 VIEW  COMPARISON:  One day prior  FINDINGS: Single supine view of the abdomen and pelvis. Midline laparotomy clips. Gaseous distention of large and small bowel loops is moderate. Example small bowel loop measures 4.0 cm in the left side of the abdomen, similar. Paucity of rectosigmoid gas. Moderate amount of gas throughout the colon.  No pneumatosis or free intraperitoneal air.  IMPRESSION: Similar large and small bowel distention, likely representing postoperative adynamic ileus. No free intraperitoneal air or other acute complication.   Electronically Signed   By: Abigail Miyamoto M.D.   On: 02/19/2014 16:14   Dg Abd Portable 1v  02/18/2014   CLINICAL DATA:  Ileus.  EXAM: PORTABLE ABDOMEN - 1 VIEW  COMPARISON:  Abdominal series 12 /08/2012.  FINDINGS: Distended loops of small and large bowel noted consistent adynamic ileus. No free air identified. Surgical staples are noted the abdomen. Degenerative changes lumbar spine and both hips.  IMPRESSION: Distended loops of small and large bowel consistent with adynamic ileus.   Electronically Signed   By: Marcello Moores  Register   On: 02/18/2014 10:30         Subjective: Patient states abdominal pain is controlled. He had an episode of vomiting with an episode of dark blood per rectum today. He denies any dizziness, chest pain shortness breath, dysuria, hematuria, headache, rashes.  Objective: Filed Vitals:   02/21/14 1639 02/21/14 2231 02/22/14 0618 02/22/14 1349  BP: 158/62 152/64 171/67 179/64  Pulse:  78 80 69  Temp:  98.9 F (37.2 C) 98.2 F (36.8 C)  97.5 F (36.4 C)  TempSrc:  Oral Oral Oral  Resp:  15 16 18   Height:      Weight:   111.4 kg (245 lb 9.5 oz)   SpO2:  99% 99% 96%    Intake/Output Summary (Last 24 hours) at 02/22/14 2004 Last data filed at 02/22/14 1400  Gross per 24 hour  Intake 1836.25 ml  Output   1825 ml  Net  11.25 ml   Weight change: -3.1 kg (-6 lb 13.4 oz) Exam:   General:  Pt is alert, follows commands appropriately, not in acute distress  HEENT: No icterus, No thrush, [place]/AT  Cardiovascular: RRR, S1/S2, no rubs, no gallops  Respiratory: CTA bilaterally, no wheezing, no crackles, no rhonchi  Abdomen: Soft/+BS, diffuse tenderness without peritoneal signs, non distended, no guarding  Extremities: 1+LE edema, No lymphangitis, No petechiae, No rashes, no synovitis  Data Reviewed: Basic Metabolic Panel:  Recent Labs Lab 02/17/14 0452 02/18/14 0421 02/20/14 0638 02/21/14 0400 02/22/14 0710  NA 138 138 139 138 141  K 4.1 4.4 4.1 4.0 4.1  CL 101 102 101 102 105  CO2 23 22 24 25 27   GLUCOSE 127* 129* 97 132* 124*  BUN 19 18 17 13 8   CREATININE 1.94* 1.66* 1.31 1.17 1.19  CALCIUM 7.9* 8.2* 8.4 8.0* 8.1*   Liver Function Tests:  Recent Labs Lab 02/17/14 1815  BILITOT  0.4   No results found for this basename: LIPASE, AMYLASE,  in the last 168 hours No results found for this basename: AMMONIA,  in the last 168 hours CBC:  Recent Labs Lab 02/18/14 0421 02/18/14 1520 02/19/14 0147 02/20/14 0638 02/21/14 0400 02/22/14 0710  WBC 7.7  --  5.7 3.2* 2.7* 3.0*  HGB 8.2* 8.7* 8.9* 7.6* 7.1* 8.1*  HCT 25.8* 27.4* 27.9* 24.0* 22.0* 24.9*  MCV 83.0  --  83.3 83.3 82.1 83.3  PLT 213  --  216 207 183 195   Cardiac Enzymes: No results found for this basename: CKTOTAL, CKMB, CKMBINDEX, TROPONINI,  in the last 168 hours BNP: No components found with this basename: POCBNP,  CBG:  Recent Labs Lab 02/22/14 0357 02/22/14 0739 02/22/14 1214 02/22/14 1613 02/22/14 1930  GLUCAP 130* 126*  174* 125* 123*    Recent Results (from the past 240 hour(s))  SURGICAL PCR SCREEN     Status: None   Collection Time    02/15/14  5:11 AM      Result Value Ref Range Status   MRSA, PCR NEGATIVE  NEGATIVE Final   Staphylococcus aureus NEGATIVE  NEGATIVE Final   Comment:            The Xpert SA Assay (FDA     approved for NASAL specimens     in patients over 83 years of age),     is one component of     a comprehensive surveillance     program.  Test performance has     been validated by [employer] for patients greater     than or equal to 78 year old.     It is not intended     to diagnose infection nor to     guide or monitor treatment.     Scheduled Meds: . amLODipine  5 mg Oral QHS  . antiseptic oral rinse  15 mL Mouth Rinse q12n4p  . carvedilol  12.5 mg Oral BID WC  . chlorhexidine  15 mL Mouth Rinse BID  . heparin subcutaneous  5,000 Units Subcutaneous 3 times per day  . insulin aspart  0-9 Units Subcutaneous 6 times per day  . pantoprazole (PROTONIX) IV  40 mg Intravenous Q24H   Continuous Infusions: . dextrose 5 % and 0.45 % NaCl with KCl 20 mEq/L 50 mL/hr at 02/22/14 1400     Shalin Vonbargen, DO  [facility] Pager [phone]  If 7PM-7AM, please contact night-coverage [email or web address] Password [email or web address] 02/22/2014, 8:04 PM   LOS: 11 days

## 2014-02-22 NOTE — Evaluation (Signed)
Occupational Therapy Evaluation Patient Details Name: [name] MRN: [id number] DOB: [birth year] Today's Date: 02/22/2014    History of Present Illness Pt is a 80 yo male admitted with GI bleed. Pt s/p laparoscopic R colectomy on 6/23. Pt with long cardiac history.   Clinical Impression   Patient is s/p laparoscopic Rt colectomy surgery resulting in functional limitations due to the deficits listed below (see OT problem list).  Patient will benefit from skilled OT acutely to increase independence and safety with ADLS to allow discharge SNF. Ot to follow acutely for adl retraining and balance     Follow Up Recommendations  SNF    Equipment Recommendations  None recommended by OT    Recommendations for Other Services       Precautions / Restrictions Precautions Precautions: Fall      Mobility Bed Mobility Overal bed mobility: Needs Assistance Bed Mobility: Supine to Sit;Sit to Supine     Supine to sit: HOB elevated;Min guard Sit to supine: Min guard   General bed mobility comments: pt requires incr time and effort  Transfers Overall transfer level: Needs assistance Equipment used: 4-wheeled walker Transfers: Sit to/from Stand Sit to Stand: Min guard         General transfer comment: pt requires v/c for locking and unlocking brakes. pt pushing 4WW without unlocking brakes    Balance                                            ADL Overall ADL's : Needs assistance/impaired                         Toilet Transfer: Loss adjuster, chartered Details (indicate cue type and reason): Pt required bil Ue on grab bar and bedside commode next to standard toilet to transfer. pt can not transfer off standard toilet without (A)         Functional mobility during ADLs: Min guard General ADL Comments: Pt reluctant to engage with therapy initially. Pt motivated by news paper. pt reports he needs a newspaper and his friend  to help him play the lottery. Pt completed bed mobility then transfer to toilet      Vision                     Perception     Praxis      Pertinent Vitals/Pain VSS Rn in room to address needs     Hand Dominance Right   Extremity/Trunk Assessment Upper Extremity Assessment Upper Extremity Assessment: Overall WFL for tasks assessed   Lower Extremity Assessment Lower Extremity Assessment: Defer to PT evaluation   Cervical / Trunk Assessment Cervical / Trunk Assessment: Normal   Communication Communication Communication: No difficulties   Cognition Arousal/Alertness: Awake/alert Behavior During Therapy: WFL for tasks assessed/performed Overall Cognitive Status: No family/caregiver present to determine baseline cognitive functioning                     General Comments       Exercises       Shoulder Instructions      Home Living Family/patient expects to be discharged to:: Skilled nursing facility  Additional Comments: pt very defensive and does not want daughters assistnace. Pt reports "tomorrow is my pay day and no one but me is going to the bank"      Prior Functioning/Environment Level of Independence: Needs assistance  Gait / Transfers Assistance Needed: use RW for long distance amb          OT Diagnosis: Generalized weakness;Cognitive deficits   OT Problem List: Decreased strength;Decreased activity tolerance;Impaired balance (sitting and/or standing);Decreased safety awareness;Decreased knowledge of use of DME or AE;Decreased knowledge of precautions;Obesity;Pain;Decreased cognition   OT Treatment/Interventions: Self-care/ADL training;Therapeutic exercise;DME and/or AE instruction;Therapeutic activities;Cognitive remediation/compensation;Patient/family education;Balance training    OT Goals(Current goals can be found in the care plan section) Acute Rehab OT Goals Patient Stated Goal: to  play the lottery OT Goal Formulation: With patient Time For Goal Achievement: 03/08/14 Potential to Achieve Goals: Good  OT Frequency: Min 2X/week   Barriers to D/C:            Co-evaluation              End of Session Equipment Utilized During Treatment: Rolling walker Nurse Communication: Mobility status;Precautions  Activity Tolerance: Patient tolerated treatment well Patient left: in bed;with call bell/phone within reach;with nursing/sitter in room   Time: 1354-1417 OT Time Calculation (min): 23 min Charges:  OT General Charges $OT Visit: 1 Procedure OT Evaluation $Initial OT Evaluation Tier I: 1 Procedure OT Treatments $Self Care/Home Management : 8-22 mins G-Codes:    Peri Maris March 21, 2014, 2:40 PM Pager: [phone]

## 2014-02-22 NOTE — Progress Notes (Signed)
PT Cancellation Note  Patient Details Name: [name] MRN: [id number] DOB: 1933/09/02   Cancelled Treatment:    Reason Eval/Treat Not Completed: Other (comment). Attempted to see pt x2, pt in restroom and then pt amb with RN staff and returned to bed. PT to re-attempt as able.   Kingsley Callander 02/22/2014, 9:32 AM  Kittie Plater, PT, DPT Pager #: [phone] Office #: [phone]

## 2014-02-22 NOTE — Progress Notes (Signed)
Patient ID: [name], male   DOB: 11-10-1933, 80 y.o.   MRN: [id number]  Subjective: Passing flatus, no BM yet. No n/v.  Had sips yesterday and tolerated well.  He is hungry and wants to eat.  Adequate UOP.  VSS.  Afebrile.   Objective:  Vital signs:  Filed Vitals:   02/21/14 1408 02/21/14 1639 02/21/14 2231 02/22/14 0618  BP: 180/71 158/62 152/64 171/67  Pulse: 65  78 80  Temp: 98.1 F (36.7 C)  98.9 F (37.2 C) 98.2 F (36.8 C)  TempSrc: Oral  Oral Oral  Resp: _0 Height:      Weight:    245 lb 9.5 oz (111.4 kg)  SpO2: 100%  99% 99%    Last BM Date: 02/18/14  Intake/Output   Yesterday:  06/29 0701 - 06/30 0700 In: 1982.5 [P.O.:295; I.V.:1687.5] Out: 5038 [Urine:1825] This shift:    I/O last 3 completed shifts: In: 2808.8 [P.O.:295; I.V.:2513.8] Out: 2400 [Urine:2400]    Physical Exam: General: Pt awake/alert/oriented x4 in no acute distress Abdomen: Soft.  Nondistended.  +bs, appropriately tender. Midline incision-staples in place, edges are approximated, no drainage or erythema.   No evidence of peritonitis.  No incarcerated hernias.   Problem List:   Principal Problem:   Acute GI bleeding Active Problems:   Diabetes mellitus   Obesity   CKD (chronic kidney disease), stage III   Anemia   Noncompliance with medications   Hypertensive urgency   Pedal edema   Colon tumor- not yet determined if cancerous    Colon cancer- adenocarcinoma   Acute on chronic renal failure   Acute blood loss anemia    Results:   Labs: Results for orders placed during the hospital encounter of 02/11/14 (from the past 48 hour(s))  GLUCOSE, CAPILLARY     Status: Abnormal   Collection Time    02/20/14 12:18 PM      Result Value Ref Range   Glucose-Capillary 111 (*) 70 - 99 mg/dL  GLUCOSE, CAPILLARY     Status: Abnormal   Collection Time    02/20/14  4:19 PM      Result Value Ref Range   Glucose-Capillary 118 (*) 70 - 99 mg/dL  GLUCOSE, CAPILLARY      Status: Abnormal   Collection Time    02/20/14  7:52 PM      Result Value Ref Range   Glucose-Capillary 115 (*) 70 - 99 mg/dL   Comment 1 Notify RN    GLUCOSE, CAPILLARY     Status: Abnormal   Collection Time    02/20/14 11:55 PM      Result Value Ref Range   Glucose-Capillary 129 (*) 70 - 99 mg/dL   Comment 1 Notify RN    CBC     Status: Abnormal   Collection Time    02/21/14  4:00 AM      Result Value Ref Range   WBC 2.7 (*) 4.0 - 10.5 K/uL   RBC 2.68 (*) 4.22 - 5.81 MIL/uL   Hemoglobin 7.1 (*) 13.0 - 17.0 g/dL   HCT 22.0 (*) 39.0 - 52.0 %   MCV 82.1  78.0 - 100.0 fL   MCH 26.5  26.0 - 34.0 pg   MCHC 32.3  30.0 - 36.0 g/dL   RDW 16.5 (*) 11.5 - 15.5 %   Platelets 183  150 - 400 K/uL  BASIC METABOLIC PANEL     Status: Abnormal   Collection Time  02/21/14  4:00 AM      Result Value Ref Range   Sodium 138  137 - 147 mEq/L   Potassium 4.0  3.7 - 5.3 mEq/L   Chloride 102  96 - 112 mEq/L   CO2 25  19 - 32 mEq/L   Glucose, Bld 132 (*) 70 - 99 mg/dL   BUN 13  6 - 23 mg/dL   Creatinine, Ser 1.17  0.50 - 1.35 mg/dL   Calcium 8.0 (*) 8.4 - 10.5 mg/dL   GFR calc non Af Amer 58 (*) >90 mL/min   GFR calc Af Amer 67 (*) >90 mL/min   Comment: (NOTE)     The eGFR has been calculated using the CKD EPI equation.     This calculation has not been validated in all clinical situations.     eGFR's persistently <90 mL/min signify possible Chronic Kidney     Disease.  GLUCOSE, CAPILLARY     Status: Abnormal   Collection Time    02/21/14  5:10 AM      Result Value Ref Range   Glucose-Capillary 135 (*) 70 - 99 mg/dL   Comment 1 Notify RN    GLUCOSE, CAPILLARY     Status: Abnormal   Collection Time    02/21/14  7:40 AM      Result Value Ref Range   Glucose-Capillary 133 (*) 70 - 99 mg/dL  GLUCOSE, CAPILLARY     Status: Abnormal   Collection Time    02/21/14 12:46 PM      Result Value Ref Range   Glucose-Capillary 133 (*) 70 - 99 mg/dL  GLUCOSE, CAPILLARY     Status: Abnormal    Collection Time    02/21/14  4:26 PM      Result Value Ref Range   Glucose-Capillary 140 (*) 70 - 99 mg/dL  GLUCOSE, CAPILLARY     Status: Abnormal   Collection Time    02/21/14  5:20 PM      Result Value Ref Range   Glucose-Capillary 126 (*) 70 - 99 mg/dL  GLUCOSE, CAPILLARY     Status: Abnormal   Collection Time    02/21/14  7:25 PM      Result Value Ref Range   Glucose-Capillary 130 (*) 70 - 99 mg/dL   Comment 1 Notify RN    GLUCOSE, CAPILLARY     Status: Abnormal   Collection Time    02/22/14 12:18 AM      Result Value Ref Range   Glucose-Capillary 122 (*) 70 - 99 mg/dL   Comment 1 Notify RN    GLUCOSE, CAPILLARY     Status: Abnormal   Collection Time    02/22/14  3:57 AM      Result Value Ref Range   Glucose-Capillary 130 (*) 70 - 99 mg/dL   Comment 1 Documented in Chart      Imaging / Studies: No results found.  Scheduled Meds: . amLODipine  5 mg Oral QHS  . antiseptic oral rinse  15 mL Mouth Rinse q12n4p  . carvedilol  12.5 mg Oral BID WC  . chlorhexidine  15 mL Mouth Rinse BID  . heparin subcutaneous  5,000 Units Subcutaneous 3 times per day  . insulin aspart  0-9 Units Subcutaneous 6 times per day  . pantoprazole (PROTONIX) IV  40 mg Intravenous Q24H   Continuous Infusions: . dextrose 5 % and 0.45 % NaCl with KCl 20 mEq/L 75 mL/hr at 02/22/14 0246   PRN Meds:.acetaminophen,  hydrALAZINE, HYDROmorphone (DILAUDID) injection, oxyCODONE-acetaminophen, sodium chloride   Antibiotics: Anti-infectives   Start     Dose/Rate Route Frequency Ordered Stop   02/15/14 1400  cefoTEtan (CEFOTAN) 2 g in dextrose 5 % 50 mL IVPB     2 g 100 mL/hr over 30 Minutes Intravenous 3 times per day 02/15/14 1225 02/16/14 1359   02/15/14 0945  cefoTEtan (CEFOTAN) 2 g in dextrose 5 % 50 mL IVPB  Status:  Discontinued     2 g 100 mL/hr over 30 Minutes Intravenous To Surgery 02/15/14 0939 02/15/14 1215   02/15/14 0700  cefoTEtan (CEFOTAN) 2 g in dextrose 5 % 50 mL IVPB  Status:   Discontinued     2 g 100 mL/hr over 30 Minutes Intravenous  Once 02/15/14 0655 02/15/14 1005   02/15/14 0600  [MAR Hold]  cefOXitin (MEFOXIN) 2 g in dextrose 5 % 50 mL IVPB  Status:  Discontinued     (On MAR Hold since 02/15/14 0621)  Comments:  Pharmacy may adjust dosing strength, interval, or rate of medication as needed for optimal therapy for the patient Send with patient on call to the OR.  Anesthesia to complete antibiotic administration [facility]70mn [facility]   2 g 100 mL/hr over 30 Minutes Intravenous On call to O.R. 02/14/14 1207 02/15/14 06147     Assessment/Plan  bleeding cecal mass; s/p laparoscopic assisted right colectomy, Surgeon: Dr MSerita Grammes 02/15/14  Post operative ileus -Resolving, give clears today -IS(pulling 1750) -PT/OT, up to chair -pain control -IV hydration ABL anemia-transfused 4 units. hgb 7.1, repeat CBC in AM  Hx of CM/CHF with last EF 60-65% (11/13/13)-decrease IVF and SL when tolerating PO Medical non compliance-refusing to take home meds CRI stage III renal disease Hypertension  AODM  Heparin/SCD for DVT  EErby Pian ABr[facility] HospitalSurgery Pager [phone] [phone] 02/22/2014  7:52 AM

## 2014-02-23 ENCOUNTER — Inpatient Hospital Stay (HOSPITAL_COMMUNITY): Payer: Medicare Other

## 2014-02-23 LAB — GLUCOSE, CAPILLARY
GLUCOSE-CAPILLARY: 126 mg/dL — AB (ref 70–99)
GLUCOSE-CAPILLARY: 130 mg/dL — AB (ref 70–99)
GLUCOSE-CAPILLARY: 135 mg/dL — AB (ref 70–99)
GLUCOSE-CAPILLARY: 145 mg/dL — AB (ref 70–99)
GLUCOSE-CAPILLARY: 170 mg/dL — AB (ref 70–99)
Glucose-Capillary: 127 mg/dL — ABNORMAL HIGH (ref 70–99)
Glucose-Capillary: 133 mg/dL — ABNORMAL HIGH (ref 70–99)

## 2014-02-23 LAB — RETICULOCYTES
RBC.: 2.86 MIL/uL — AB (ref 4.22–5.81)
RETIC CT PCT: 1.9 % (ref 0.4–3.1)
Retic Count, Absolute: 54.3 10*3/uL (ref 19.0–186.0)

## 2014-02-23 LAB — BASIC METABOLIC PANEL
BUN: 6 mg/dL (ref 6–23)
CO2: 24 meq/L (ref 19–32)
CREATININE: 1.26 mg/dL (ref 0.50–1.35)
Calcium: 8.1 mg/dL — ABNORMAL LOW (ref 8.4–10.5)
Chloride: 100 mEq/L (ref 96–112)
GFR calc Af Amer: 61 mL/min — ABNORMAL LOW (ref >90)
GFR calc non Af Amer: 53 mL/min — ABNORMAL LOW (ref >90)
GLUCOSE: 135 mg/dL — AB (ref 70–99)
Potassium: 4.1 mEq/L (ref 3.7–5.3)
Sodium: 136 mEq/L — ABNORMAL LOW (ref 137–147)

## 2014-02-23 LAB — CBC
HCT: 23.2 % — ABNORMAL LOW (ref 39.0–52.0)
Hemoglobin: 7.4 g/dL — ABNORMAL LOW (ref 13.0–17.0)
MCH: 26.1 pg (ref 26.0–34.0)
MCHC: 31.9 g/dL (ref 30.0–36.0)
MCV: 82 fL (ref 78.0–100.0)
Platelets: 185 10*3/uL (ref 150–400)
RBC: 2.83 MIL/uL — ABNORMAL LOW (ref 4.22–5.81)
RDW: 16.6 % — AB (ref 11.5–15.5)
WBC: 3 10*3/uL — ABNORMAL LOW (ref 4.0–10.5)

## 2014-02-23 LAB — IRON AND TIBC
Iron: <10 ug/dL — ABNORMAL LOW (ref 42–135)
UIBC: 232 ug/dL (ref 125–400)

## 2014-02-23 LAB — VITAMIN B12: VITAMIN B 12: 629 pg/mL (ref 211–911)

## 2014-02-23 LAB — FOLATE: Folate: 12.2 ng/mL

## 2014-02-23 LAB — FERRITIN: FERRITIN: 18 ng/mL — AB (ref 22–322)

## 2014-02-23 MED ORDER — FUROSEMIDE 10 MG/ML IJ SOLN
20.0000 mg | Freq: Once | INTRAMUSCULAR | Status: AC
  Administered 2014-02-23: 20 mg via INTRAVENOUS
  Filled 2014-02-23: qty 2

## 2014-02-23 NOTE — Progress Notes (Signed)
Patient ID: [name], male   DOB: [birth year], 80 y.o.   MRN: [id number]  Subjective: Vomited yesterday.  Had several BMs.  No n/v, given sips throughout the day and tolerated.  Hungry this AM, wants "real food."  Adequate UOP.  Ambulating in hallways.  Sitting up in chair.   Objective:  Vital signs:  Filed Vitals:   02/22/14 0618 02/22/14 1349 02/22/14 2122 02/23/14 0625  BP: 171/67 179/64 161/72 140/53  Pulse: 80 69 75 66  Temp: 98.2 F (36.8 C) 97.5 F (36.4 C) 98.3 F (36.8 C) 98.4 F (36.9 C)  TempSrc: Oral Oral Oral Oral  Resp: _0 Height:      Weight: 245 lb 9.5 oz (111.4 kg)  250 lb 10.6 oz (113.7 kg) 248 lb 0.3 oz (112.5 kg)  SpO2: 99% 96% 98% 98%    Last BM Date: 02/22/14  Intake/Output   Yesterday:  06/30 0701 - 07/01 0700 In: 1230 [I.V.:1230] Out: 1175 [Urine:1025; Emesis/NG output:150] This shift:  Total I/O In: -  Out: 300 [Urine:300]   Physical Exam:  General: Pt awake/alert/oriented x4 in no acute distress  Abdomen: Soft. Nondistended. +bs, appropriately tender. Midline incision-staples in place, edges are approximated, no drainage or erythema, healing lap sites. No evidence of peritonitis. No incarcerated hernias.  Problem List:   Principal Problem:   Acute GI bleeding Active Problems:   Diabetes mellitus   Obesity   CKD (chronic kidney disease), stage III   Anemia   Noncompliance with medications   Hypertensive urgency   Pedal edema   Colon tumor- not yet determined if cancerous    Colon cancer- adenocarcinoma   Acute on chronic renal failure   Acute blood loss anemia    Results:   Labs: Results for orders placed during the hospital encounter of 02/11/14 (from the past 48 hour(s))  GLUCOSE, CAPILLARY     Status: Abnormal   Collection Time    02/21/14 12:46 PM      Result Value Ref Range   Glucose-Capillary 133 (*) 70 - 99 mg/dL  GLUCOSE, CAPILLARY     Status: Abnormal   Collection Time    02/21/14  4:26 PM       Result Value Ref Range   Glucose-Capillary 140 (*) 70 - 99 mg/dL  GLUCOSE, CAPILLARY     Status: Abnormal   Collection Time    02/21/14  5:20 PM      Result Value Ref Range   Glucose-Capillary 126 (*) 70 - 99 mg/dL  GLUCOSE, CAPILLARY     Status: Abnormal   Collection Time    02/21/14  7:25 PM      Result Value Ref Range   Glucose-Capillary 130 (*) 70 - 99 mg/dL   Comment 1 Notify RN    GLUCOSE, CAPILLARY     Status: Abnormal   Collection Time    02/22/14 12:18 AM      Result Value Ref Range   Glucose-Capillary 122 (*) 70 - 99 mg/dL   Comment 1 Notify RN    GLUCOSE, CAPILLARY     Status: Abnormal   Collection Time    02/22/14  3:57 AM      Result Value Ref Range   Glucose-Capillary 130 (*) 70 - 99 mg/dL   Comment 1 Documented in Chart    BASIC METABOLIC PANEL     Status: Abnormal   Collection Time    02/22/14  7:10 AM      Result Value Ref  Range   Sodium 141  137 - 147 mEq/L   Potassium 4.1  3.7 - 5.3 mEq/L   Chloride 105  96 - 112 mEq/L   CO2 27  19 - 32 mEq/L   Glucose, Bld 124 (*) 70 - 99 mg/dL   BUN 8  6 - 23 mg/dL   Creatinine, Ser 1.19  0.50 - 1.35 mg/dL   Calcium 8.1 (*) 8.4 - 10.5 mg/dL   GFR calc non Af Amer 57 (*) >90 mL/min   GFR calc Af Amer 66 (*) >90 mL/min   Comment: (NOTE)     The eGFR has been calculated using the CKD EPI equation.     This calculation has not been validated in all clinical situations.     eGFR's persistently <90 mL/min signify possible Chronic Kidney     Disease.  CBC     Status: Abnormal   Collection Time    02/22/14  7:10 AM      Result Value Ref Range   WBC 3.0 (*) 4.0 - 10.5 K/uL   RBC 2.99 (*) 4.22 - 5.81 MIL/uL   Hemoglobin 8.1 (*) 13.0 - 17.0 g/dL   HCT 24.9 (*) 39.0 - 52.0 %   MCV 83.3  78.0 - 100.0 fL   MCH 27.1  26.0 - 34.0 pg   MCHC 32.5  30.0 - 36.0 g/dL   RDW 16.6 (*) 11.5 - 15.5 %   Platelets 195  150 - 400 K/uL  GLUCOSE, CAPILLARY     Status: Abnormal   Collection Time    02/22/14  7:39 AM      Result Value  Ref Range   Glucose-Capillary 126 (*) 70 - 99 mg/dL   Comment 1 Notify RN    GLUCOSE, CAPILLARY     Status: Abnormal   Collection Time    02/22/14 12:14 PM      Result Value Ref Range   Glucose-Capillary 174 (*) 70 - 99 mg/dL  GLUCOSE, CAPILLARY     Status: Abnormal   Collection Time    02/22/14  4:13 PM      Result Value Ref Range   Glucose-Capillary 125 (*) 70 - 99 mg/dL  GLUCOSE, CAPILLARY     Status: Abnormal   Collection Time    02/22/14  7:30 PM      Result Value Ref Range   Glucose-Capillary 123 (*) 70 - 99 mg/dL   Comment 1 Notify RN    GLUCOSE, CAPILLARY     Status: Abnormal   Collection Time    02/23/14 12:21 AM      Result Value Ref Range   Glucose-Capillary 127 (*) 70 - 99 mg/dL   Comment 1 Notify RN    GLUCOSE, CAPILLARY     Status: Abnormal   Collection Time    02/23/14  4:07 AM      Result Value Ref Range   Glucose-Capillary 130 (*) 70 - 99 mg/dL   Comment 1 Notify RN    CBC     Status: Abnormal   Collection Time    02/23/14  4:46 AM      Result Value Ref Range   WBC 3.0 (*) 4.0 - 10.5 K/uL   RBC 2.83 (*) 4.22 - 5.81 MIL/uL   Hemoglobin 7.4 (*) 13.0 - 17.0 g/dL   HCT 23.2 (*) 39.0 - 52.0 %   MCV 82.0  78.0 - 100.0 fL   MCH 26.1  26.0 - 34.0 pg   MCHC 31.9  30.0 - 36.0 g/dL   RDW 16.6 (*) 11.5 - 15.5 %   Platelets 185  150 - 400 K/uL  BASIC METABOLIC PANEL     Status: Abnormal   Collection Time    02/23/14  4:46 AM      Result Value Ref Range   Sodium 136 (*) 137 - 147 mEq/L   Potassium 4.1  3.7 - 5.3 mEq/L   Chloride 100  96 - 112 mEq/L   CO2 24  19 - 32 mEq/L   Glucose, Bld 135 (*) 70 - 99 mg/dL   BUN 6  6 - 23 mg/dL   Creatinine, Ser 1.26  0.50 - 1.35 mg/dL   Calcium 8.1 (*) 8.4 - 10.5 mg/dL   GFR calc non Af Amer 53 (*) >90 mL/min   GFR calc Af Amer 61 (*) >90 mL/min   Comment: (NOTE)     The eGFR has been calculated using the CKD EPI equation.     This calculation has not been validated in all clinical situations.     eGFR's persistently  <90 mL/min signify possible Chronic Kidney     Disease.    Imaging / Studies: No results found.  Scheduled Meds: . amLODipine  5 mg Oral QHS  . antiseptic oral rinse  15 mL Mouth Rinse q12n4p  . carvedilol  12.5 mg Oral BID WC  . chlorhexidine  15 mL Mouth Rinse BID  . heparin subcutaneous  5,000 Units Subcutaneous 3 times per day  . insulin aspart  0-9 Units Subcutaneous 6 times per day  . pantoprazole (PROTONIX) IV  40 mg Intravenous Q24H   Continuous Infusions: . dextrose 5 % and 0.45 % NaCl with KCl 20 mEq/L 50 mL/hr (02/22/14 2120)   PRN Meds:.acetaminophen, hydrALAZINE, HYDROmorphone (DILAUDID) injection, ondansetron (ZOFRAN) IV, oxyCODONE-acetaminophen, sodium chloride   Antibiotics: Anti-infectives   Start     Dose/Rate Route Frequency Ordered Stop   02/15/14 1400  cefoTEtan (CEFOTAN) 2 g in dextrose 5 % 50 mL IVPB     2 g 100 mL/hr over 30 Minutes Intravenous 3 times per day 02/15/14 1225 02/16/14 1359   02/15/14 0945  cefoTEtan (CEFOTAN) 2 g in dextrose 5 % 50 mL IVPB  Status:  Discontinued     2 g 100 mL/hr over 30 Minutes Intravenous To Surgery 02/15/14 0939 02/15/14 1215   02/15/14 0700  cefoTEtan (CEFOTAN) 2 g in dextrose 5 % 50 mL IVPB  Status:  Discontinued     2 g 100 mL/hr over 30 Minutes Intravenous  Once 02/15/14 0655 02/15/14 1005   02/15/14 0600  [MAR Hold]  cefOXitin (MEFOXIN) 2 g in dextrose 5 % 50 mL IVPB  Status:  Discontinued     (On MAR Hold since 02/15/14 0621)  Comments:  Pharmacy may adjust dosing strength, interval, or rate of medication as needed for optimal therapy for the patient Send with patient on call to the OR.  Anest[facility] complete antibiotic [facility]mn [facility]   2 g 100 mL/hr over 30 Minutes Intravenous On call to O.R. 02/14/14 1207 02/15/14 04665     Assessment/Plan  bleeding cecal mass; s/p laparoscopic assisted right colectomy, Surgeon: Dr MSerita Grammes 02/15/14  Post operative ileus   POD#8 -Resolving, give clears this AM  -IS(pulling 1750)  -PT/OT, up to chair  -pain control  -IV hydration  -SCD/heparin -remove staples in 2-3 days ABL anemia-transfused 4 units. H&h stable this AM  Hx of CM/CHF with  last EF 60-65% (11/13/13)-SL when tolerating PO  Medical non compliance-refusing to take home meds  CRI stage III renal disease  Hypertension  AODM   Erby Pian, [facility] Surgery Pager [phone] Office [phone]  02/23/2014 8:02 AM

## 2014-02-23 NOTE — Progress Notes (Signed)
PT Cancellation Note  Patient Details Name: [name] MRN: [id number] DOB: 08/23/1934   Cancelled Treatment:    Reason Eval/Treat Not Completed:  (pt reports "I just finished walking."). Spoke with RN, Rn reports pt ambulated x 2 with RN staff the entire floor. PT to re-attempt as able.   Kingsley Callander 02/23/2014, 2:40 PM  Kittie Plater, PT, DPT Pager #: [phone] Office #: [phone]

## 2014-02-23 NOTE — Progress Notes (Signed)
PT Cancellation Note  Patient Details Name: [name] MRN: [id number] DOB: 02-27-1934   Cancelled Treatment:    Reason Eval/Treat Not Completed: Patient declined, no reason specified.  Pt has walked 4 laps around the unit with RN staff today.  He is adamantly refusing to work with PT and he is not agreeable to work with [place] in the future (he does not want [place] to come back).  PT to sign off as Conservation officer, historic buildings is handling his walking.  Please re- order if needed.  Otherwise, defer PT to next venue of care (SNF).   Thanks,    Barbarann Ehlers. [place], [place], DPT [phone]   02/23/2014, 5:09 PM

## 2014-02-23 NOTE — Progress Notes (Signed)
Patient Demographics  [name], is a 80 y.o. male, DOB - October 28, 1933, YCX:[id number]  Admit date - 02/11/2014   Admitting Physician Toy Baker, MD  Outpatient Primary MD for the patient is No primary provider on file.  LOS - 12   Chief Complaint  Patient presents with  . Near Syncope      Brief narrative:    [name] is a 80 y.o. male with h/o DM, HTN, ischemic cardiomyopathy, Narcolepsy, obesity and poor compliance with seeing his PCP and taking his medications. He presented with dizziness, CP, sob and hematochezia with Hgb of 6.4. He was admitted in March of this year for CHF and noted to be very non-compliant with medications.He had heme positive stools in the ER with severe anemia. Dr. Collene Mares was consulted and colonoscopy revealed a cecal mass. General surgery was consulted and R-hemicolectomy was performed on 02/15/14. 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 Unfortunately, he has developed a post-op ileus which required NG placement. This was removed on 02/20/14. During the admission he also developed acute on chronic renal failure prompting d/c of furosemide and lisinopril. IVF given judiciously with improvement of renal function.   Per the records in [facility] it appears that his PCP ordered home health for him but he kicked them out of his home (see note from [name] on 5/29- telephone conversation) and so he was dismissed by them.    Subjective:   [name] today has, No headache, No chest pain, No abdominal pain - No Nausea, No new weakness tingling or numbness, No Cough - SOB. Had 2-3 BMs yesterday.   Assessment & Plan    Anemia - combination of AOCD + GI Bleed+ Dilution   -Patient received 2 units PRBCs 02/11/2014 , Received additional 2 units PRBCs 02/16/2014 , LDH  haptoglobin and bilirubin unremarkable. -Will check An panel as well, monitor H&H closely goal will be to keep his hemoglobin around 7.5. -Denies any further rectal bleeding-last bloody BM on 02/17/14  -02/13/2014 colonoscopy (Dr. Dalphine Handing mass, seen by general surgery and underwent right hemicolectomy on 02/15/2014 by Dr. Donne Hazel.    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 Wynelle Cleveland spoke with Dr. Marius Ditch states Gi onc nurse will arrange follow up as outpt with GI oncology  -Status post right hemicolectomy 02/15/2014--Dr. Donne Hazel      Post-op ileus  Resolving, had 3 BMs on 02/22/2014. On clear liquid diet surgery following.      Acute on chronic renal failure (CKD stage III)  -Furosemide and lisinopril discontinued  -Previous Baseline creatinine 1.4-1.8  -02/21/2014 serum creatinine 1.17 , renal function close to baseline    Chronic diastolic CHF with EF 49% -Monitor closely with IV fluids which have been reduced - will need ACE wraps prior to d/c  -decrease IVF to 50cc/hr , resume diuretic upon discharge.     HTN  -BP has been labile-patient has also intermittently refused his antihypertensive medications depending upon his mood  -improving but not optimal  -Increased carvedilol 12.5 mg twice a day (6/27)  -Hydralazine when necessary SBP greater than 180  -Amlodipine 5 mg daily added     Diabetes mellitus type 2  -Hemoglobin A1c 7.0 -- 02/12/2014  -Continue NovoLog sliding scale  -Patient takes glipizide in  the outpatient setting which has been held   CBG (last 3)   Recent Labs  02/23/14 0021 02/23/14 0407 02/23/14 0810  GLUCAP 127* 130* 135*   Lab Results  Component Value Date   HGBA1C 7.0* 02/12/2014          Code Status: Full  Family Communication: None present  Disposition Plan: SNF   Procedures     Consults [facility]   Medications  Scheduled Meds: . amLODipine  5 mg Oral QHS  . antiseptic oral  rinse  15 mL Mouth Rinse q12n4p  . carvedilol  12.5 mg Oral BID WC  . chlorhexidine  15 mL Mouth Rinse BID  . heparin subcutaneous  5,000 Units Subcutaneous 3 times per day  . insulin aspart  0-9 Units Subcutaneous 6 times per day  . pantoprazole (PROTONIX) IV  40 mg Intravenous Q24H   Continuous Infusions: . dextrose 5 % and 0.45 % NaCl with KCl 20 mEq/L 50 mL/hr (02/22/14 2120)   PRN Meds:.acetaminophen, hydrALAZINE, HYDROmorphone (DILAUDID) injection, ondansetron (ZOFRAN) IV, oxyCODONE-acetaminophen, sodium chloride  DVT Prophylaxis    Heparin    Lab Results  Component Value Date   PLT 185 02/23/2014    Antibiotics     Anti-infectives   Start     Dose/Rate Route Frequency Ordered Stop   02/15/14 1400  cefoTEtan (CEFOTAN) 2 g in dextrose 5 % 50 mL IVPB     2 g 100 mL/hr over 30 Minutes Intravenous 3 times per day 02/15/14 1225 02/16/14 1359   02/15/14 0945  cefoTEtan (CEFOTAN) 2 g in dextrose 5 % 50 mL IVPB  Status:  Discontinued     2 g 100 mL/hr over 30 Minutes Intravenous To Surgery 02/15/14 0939 02/15/14 1215   02/15/14 0700  cefoTEtan (CEFOTAN) 2 g in dextrose 5 % 50 mL IVPB  Status:  Discontinued     2 g 100 mL/hr over 30 Minutes Intravenous  Once 02/15/14 0655 02/15/14 1005   02/15/14 0600  [MAR Hold]  cefOXitin (MEFOXIN) 2 g in dextrose 5 % 50 mL IVPB  Status:  Discontinued     (On MAR Hold since 02/15/14 0621)  Comments:  Pharmacy may adjust dosing strength, interval, or rate of medication as needed for optimal therapy for the patient Send with patient on call to the OR.  Anesthesia to complete antibiotic administration <28min prior to incision per [facility].   2 g 100 mL/hr over 30 Minutes Intravenous On call to O.R. 02/14/14 1207 02/15/14 0655          Objective:   Filed Vitals:   02/22/14 0618 02/22/14 1349 02/22/14 2122 02/23/14 0625  BP: 171/67 179/64 161/72 140/53  Pulse: 80 69 75 66  Temp: 98.2 F (36.8 C) 97.5 F (36.4 C) 98.3 F (36.8 C) 98.4  F (36.9 C)  TempSrc: Oral Oral Oral Oral  Resp: 16 18 16 16   Height:      Weight: 111.4 kg (245 lb 9.5 oz)  113.7 kg (250 lb 10.6 oz) 112.5 kg (248 lb 0.3 oz)  SpO2: 99% 96% 98% 98%    Wt Readings from Last 3 Encounters:  02/23/14 112.5 kg (248 lb 0.3 oz)  02/23/14 112.5 kg (248 lb 0.3 oz)  02/23/14 112.5 kg (248 lb 0.3 oz)     Intake/Output Summary (Last 24 hours) at 02/23/14 1122 Last data filed at 02/23/14 0730  Gross per 24 hour  Intake   1230 ml  Output   1325 ml  Net    -  95 ml     Physical Exam  Awake Alert, Oriented X 3, No new F.N deficits, Normal affect [place].AT,PERRAL Supple Neck,No JVD, No cervical lymphadenopathy appriciated.  Symmetrical Chest wall movement, Good air movement bilaterally, CTAB RRR,No Gallops,Rubs or new Murmurs, No Parasternal Heave Hypoactive but +ve B.Sounds, Abd Soft, No tenderness, No organomegaly appriciated, No rebound - guarding or rigidity. No Cyanosis, Clubbing or edema, No new Rash or bruise      Data Review   Micro Results Recent Results (from the past 240 hour(s))  SURGICAL PCR SCREEN     Status: None   Collection Time    02/15/14  5:11 AM      Result Value Ref Range Status   MRSA, PCR NEGATIVE  NEGATIVE Final   Staphylococcus aureus NEGATIVE  NEGATIVE Final   Comment:            The Xpert SA Assay (FDA     approved for NASAL specimens     in patients over 52 years of age),     is one component of     a comprehensive surveillance     program.  Test performance has     been validated by [employer] for patients greater     than or equal to 77 year old.     It is not intended     to diagnose infection nor to     guide or monitor treatment.    Radiology Reports Dg Chest Portable 1 View  02/11/2014   CLINICAL DATA:  NEAR SYNCOPE  EXAM: PORTABLE CHEST - 1 VIEW  COMPARISON:  Prior radiograph from 12/08/2013  FINDINGS: Prominent cardiomegaly is stable as compared to prior exam. Mediastinal silhouette within normal  limits. Few scattered atherosclerotic calcifications present within the aortic arch.  The lungs are normally inflated. There is mild central perihilar pulmonary vascular congestion without overt pulmonary edema. No definite pleural effusion. No focal infiltrate identified. There is no pneumothorax.  No acute osseous abnormality identified.  IMPRESSION: Stable cardiomegaly with mild central perihilar vascular congestion without overt pulmonary edema.   Electronically Signed   By: Jeannine Boga M.D.   On: 02/11/2014 23:53   Dg Abd Portable 1v  02/23/2014   CLINICAL DATA:  Abdominal pain.  EXAM: PORTABLE ABDOMEN - 1 VIEW  COMPARISON:  Abdominal radiograph 02/19/2014.  FINDINGS: Gas is seen throughout the colon to at least the level of the descending colon. Several mildly dilated loops of gas-filled small bowel are seen projecting over the central abdomen measuring up to 4.1 cm in diameter. No gross evidence of pneumoperitoneum. Midline surgical staples are noted.  IMPRESSION: 1. Bowel gas pattern is again most suggestive of a postoperative ileus, as above.   Electronically Signed   By: Vinnie Langton M.D.   On: 02/23/2014 08:10   Dg Abd Portable 1v  02/19/2014   CLINICAL DATA:  Status post intestinal surgery several days ago. Evaluate dilated small bowel loops.  EXAM: PORTABLE ABDOMEN - 1 VIEW  COMPARISON:  One day prior  FINDINGS: Single supine view of the abdomen and pelvis. Midline laparotomy clips. Gaseous distention of large and small bowel loops is moderate. Example small bowel loop measures 4.0 cm in the left side of the abdomen, similar. Paucity of rectosigmoid gas. Moderate amount of gas throughout the colon.  No pneumatosis or free intraperitoneal air.  IMPRESSION: Similar large and small bowel distention, likely representing postoperative adynamic ileus. No free intraperitoneal air or other acute  complication.   Electronically Signed   By: Abigail Miyamoto M.D.   On: 02/19/2014 16:14   Dg Abd  Portable 1v  02/18/2014   CLINICAL DATA:  Ileus.  EXAM: PORTABLE ABDOMEN - 1 VIEW  COMPARISON:  Abdominal series 12 /08/2012.  FINDINGS: Distended loops of small and large bowel noted consistent adynamic ileus. No free air identified. Surgical staples are noted the abdomen. Degenerative changes lumbar spine and both hips.  IMPRESSION: Distended loops of small and large bowel consistent with adynamic ileus.   Electronically Signed   By: Marcello Moores  Register   On: 02/18/2014 10:30    CBC  Recent Labs Lab 02/19/14 0147 02/20/14 0093 02/21/14 0400 02/22/14 0710 02/23/14 0446  WBC 5.7 3.2* 2.7* 3.0* 3.0*  HGB 8.9* 7.6* 7.1* 8.1* 7.4*  HCT 27.9* 24.0* 22.0* 24.9* 23.2*  PLT 216 207 183 195 185  MCV 83.3 83.3 82.1 83.3 82.0  MCH 26.6 26.4 26.5 27.1 26.1  MCHC 31.9 31.7 32.3 32.5 31.9  RDW 16.7* 16.6* 16.5* 16.6* 16.6*    Chemistries   Recent Labs Lab 02/17/14 1815 02/18/14 0421 02/20/14 0638 02/21/14 0400 02/22/14 0710 02/23/14 0446  NA  --  138 139 138 141 136*  K  --  4.4 4.1 4.0 4.1 4.1  CL  --  102 101 102 105 100  CO2  --  22 24 25 27 24   GLUCOSE  --  129* 97 132* 124* 135*  BUN  --  18 17 13 8 6   CREATININE  --  1.66* 1.31 1.17 1.19 1.26  CALCIUM  --  8.2* 8.4 8.0* 8.1* 8.1*  BILITOT 0.4  --   --   --   --   --    ------------------------------------------------------------------------------------------------------------------ estimated creatinine clearance is 60.7 ml/min (by C-G formula based on Cr of 1.26). ------------------------------------------------------------------------------------------------------------------ No results found for this basename: HGBA1C,  in the last 72 hours ------------------------------------------------------------------------------------------------------------------ No results found for this basename: CHOL, HDL, LDLCALC, TRIG, CHOLHDL, LDLDIRECT,  in the last 72  hours ------------------------------------------------------------------------------------------------------------------ No results found for this basename: TSH, T4TOTAL, FREET3, T3FREE, THYROIDAB,  in the last 72 hours ------------------------------------------------------------------------------------------------------------------ No results found for this basename: VITAMINB12, FOLATE, FERRITIN, TIBC, IRON, RETICCTPCT,  in the last 72 hours  Coagulation profile No results found for this basename: INR, PROTIME,  in the last 168 hours  No results found for this basename: DDIMER,  in the last 72 hours  Cardiac Enzymes No results found for this basename: CK, CKMB, TROPONINI, MYOGLOBIN,  in the last 168 hours ------------------------------------------------------------------------------------------------------------------ No components found with this basename: POCBNP,      Time Spent in minutes   35   SINGH,PRASHANT K M.D on 02/23/2014 at 11:22 AM  Between 7am to 7pm - Pager - [phone]  After 7pm go to [email or web address] - password [email or web address]  And look for the night coverage person covering for me after hours  [facility] Office  [phone]   **Disclaimer: This note may have been dictated with voice recognition software. Similar sounding words can inadvertently be transcribed and this note may contain transcription errors which may not have been corrected upon publication of note.**

## 2014-02-23 NOTE — Progress Notes (Signed)
INITIAL NUTRITION ASSESSMENT  DOCUMENTATION CODES Per approved criteria  -Obesity Unspecified   INTERVENTION: Diet advancement per MD RD to continue to monitor  NUTRITION DIAGNOSIS: Inadequate oral intake related to medical condition as evidenced by NPO status/clear liquid diet > 10 days.   Goal: Pt to meet >/= 90% of their estimated nutrition needs   Monitor:  Diet advancement, PO intake, weight trend, labs  Reason for Assessment: Length of Stay  80 y.o. male  Admitting Dx: Acute GI bleeding  ASSESSMENT: 80 y.o. male with h/o DM, HTN, ischemic cardiomyopathy, Narcolepsy, obesity and poor compliance with seeing his PCP and taking his medications. He presented with dizziness, CP, sob and hematochezia with Hgb of 6.4. He had heme positive stools in the ER with severe anemia. Dr. Collene Mares was consulted and colonoscopy revealed a cecal mass. General surgery was consulted and R-hemicolectomy was performed on 02/15/14. 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 Unfortunately, he has developed a post-op ileus which required NG placement. This was removed on 02/20/14.   Pt has been NPO or on a clear liquid diet since admission on 6/22. Pt's diet was advanced to clear liquids on 6/30 and to full liquids this afternoon. Pt denies any nausea or vomiting today. He reports he had full liquids for lunch, tolerated them well, is still hungry and is ready to eat again. He states he has a great appetite and "you don't have to worry about me not eating enough". Pt reports usual weight of 248 lbs and denies weight loss. RD encouraged general healthful diet; pt states he will eat whatever is sent to him.  RD will continue to monitor to ensure adequate nutrition intake with diet advancement.   Height: Ht Readings from Last 1 Encounters:  02/14/14 5\' 10"  (1.778 m)    Weight: Wt Readings from Last 1 Encounters:  02/23/14 248 lb 0.3 oz (112.5 kg)    Ideal Body Weight: 166 lbs  % Ideal Body Weight: 149%  Wt Readings  from Last 10 Encounters:  02/23/14 248 lb 0.3 oz (112.5 kg)  02/23/14 248 lb 0.3 oz (112.5 kg)  02/23/14 248 lb 0.3 oz (112.5 kg)  01/26/14 258 lb (117.028 kg)  12/13/13 247 lb (112.038 kg)  12/08/13 247 lb (112.038 kg)  11/16/13 227 lb 1.6 oz (103.012 kg)  10/07/13 238 lb (107.956 kg)  07/21/13 245 lb (111.131 kg)  06/07/13 254 lb (115.214 kg)    Usual Body Weight: 248 lbs  % Usual Body Weight: 100%  BMI:  Body mass index is 35.59 kg/(m^2). (Obesity)  Estimated Nutritional Needs: Kcal: 2200-2400 Protein: 105-120 grams Fluid: 2.2-2.4 L/day  Skin: +1 RUE, LUE, RLE, and LLE edema  Diet Order: Full Liquid  EDUCATION NEEDS: -No education needs identified at this time   Intake/Output Summary (Last 24 hours) at 02/23/14 1426 Last data filed at 02/23/14 1419  Gross per 24 hour  Intake   1440 ml  Output    750 ml  Net    690 ml    Last BM: 6/30  Labs:   Recent Labs Lab 02/21/14 0400 02/22/14 0710 02/23/14 0446  NA 138 141 136*  K 4.0 4.1 4.1  CL 102 105 100  CO2 25 27 24   BUN 13 8 6   CREATININE 1.17 1.19 1.26  CALCIUM 8.0* 8.1* 8.1*  GLUCOSE 132* 124* 135*    CBG (last 3)   Recent Labs  02/23/14 0407 02/23/14 0810 02/23/14 1150  GLUCAP 130* 135* 133*    Scheduled Meds: . amLODipine  5 mg  Oral QHS  . antiseptic oral rinse  15 mL Mouth Rinse q12n4p  . carvedilol  12.5 mg Oral BID WC  . chlorhexidine  15 mL Mouth Rinse BID  . heparin subcutaneous  5,000 Units Subcutaneous 3 times per day  . insulin aspart  0-9 Units Subcutaneous 6 times per day  . pantoprazole (PROTONIX) IV  40 mg Intravenous Q24H    Continuous Infusions: . dextrose 5 % and 0.45 % NaCl with KCl 20 mEq/L 50 mL/hr (02/22/14 2120)    Past Medical History  Diagnosis Date  . Smoker   . Narcolepsy   . Hypertension   . BPH (benign prostatic hyperplasia)   . Arthritis   . Diabetes mellitus   . Neuropathy in diabetes   . Herpes   . Dyslipidemia   . Obesity   . Lymphedema      LE lymphedemia with hx of cellulitis.  . CKD (chronic kidney disease)   . Anemia   . History of GI bleed 06/2012    EGD unremarkable;  Colo with large cecal polyp (bx benign)  . Ischemic cardiomyopathy     Echo 06/19/12: EF 40-45%, mid to distal anteroseptal HK, moderate LAE, PASP 37  . Chronic systolic heart failure   . CAD (coronary artery disease)     a. probable underlying CAD;  b. Type 2 NSTEMi in setting of profound anemia (Hgb 3.6) from GI bleed in 06/2012 and WMA on echo;  c. med Rx due to CKD and GI bleed  . CHF (congestive heart failure)   . Atrial fibrillation     Past Surgical History  Procedure Laterality Date  . Leg surgery      Left leg surgery. broken leg  . Esophagogastroduodenoscopy  06/19/2012    Procedure: ESOPHAGOGASTRODUODENOSCOPY (EGD);  Surgeon: Beryle Beams, MD;  Location: [facility] ENDOSCOPY;  Service: Endoscopy;  Laterality: N/A;  . Colonoscopy  06/21/2012    Procedure: COLONOSCOPY;  Surgeon: Juanita Craver, MD;  Location: [facility] ENDOSCOPY;  Service: Endoscopy;  Laterality: N/A;  . Colonoscopy Left 02/13/2014    Procedure: COLONOSCOPY;  Surgeon: Juanita Craver, MD;  Location: [place];  Service: Endoscopy;  Laterality: Left;  . Laparoscopic right colectomy Right 02/15/2014    Procedure: LAPAROSCOPIC ASISSTED RIGHT  COLECTOMY, POSSIBLE OPEN;  Surgeon: Rolm Bookbinder, MD;  Location: [place];  Service: General;  Laterality: Right;    Pryor Ochoa RD, LDN Inpatient Clinical Dietitian Pager: [phone] After Hours Pager: [phone]

## 2014-02-23 NOTE — Progress Notes (Signed)
Improving. Try clears.He is hungry. No further nausea. Patient examined and I agree with the assessment and plan  Georganna Skeans, MD, MPH, FACS Trauma: [phone] General Surgery: [phone]  02/23/2014 3:20 PM

## 2014-02-23 NOTE — Clinical Social Work Note (Signed)
Clinical Social Worker continuing to follow patient and family for support and discharge planning needs.  CSW spoke with patient at bedside who is agreeable with placement at [facility] once medically ready.  CSW left message with facility liaison to confirm bed availability for patient.  CSW remains available for support and to facilitate patient discharge needs once medically ready.  Barbette Or, [place]

## 2014-02-24 LAB — GLUCOSE, CAPILLARY
Glucose-Capillary: 138 mg/dL — ABNORMAL HIGH (ref 70–99)
Glucose-Capillary: 199 mg/dL — ABNORMAL HIGH (ref 70–99)
Glucose-Capillary: 123 mg/dL — ABNORMAL HIGH (ref 70–99)
Glucose-Capillary: 135 mg/dL — ABNORMAL HIGH (ref 70–99)

## 2014-02-24 LAB — HEMOGLOBIN AND HEMATOCRIT, BLOOD
HCT: 25.3 % — ABNORMAL LOW (ref 39.0–52.0)
Hemoglobin: 8.2 g/dL — ABNORMAL LOW (ref 13.0–17.0)

## 2014-02-24 MED ORDER — INSULIN ASPART 100 UNIT/ML ~~LOC~~ SOLN: 0.0000 [IU] | Freq: Three times a day (TID) | SUBCUTANEOUS | Status: DC

## 2014-02-24 MED ORDER — SODIUM CHLORIDE 0.9 % IJ SOLN: 3.0000 mL | Freq: Two times a day (BID) | INTRAMUSCULAR | Status: DC

## 2014-02-24 MED ORDER — SODIUM CHLORIDE 0.9 % IV SOLN
750.0000 mg | Freq: Once | INTRAVENOUS | Status: DC
  Filled 2014-02-24 (×2): qty 15

## 2014-02-24 MED ORDER — FUROSEMIDE 20 MG PO TABS
20.0000 mg | ORAL_TABLET | Freq: Every day | ORAL | Status: DC
  Administered 2014-02-24: 20 mg via ORAL
  Filled 2014-02-24 (×2): qty 1

## 2014-02-24 MED ORDER — SODIUM CHLORIDE 0.9 % IV SOLN: 250.0000 mL | INTRAVENOUS | Status: DC | PRN

## 2014-02-24 MED ORDER — SODIUM CHLORIDE 0.9 % IJ SOLN: 3.0000 mL | INTRAMUSCULAR | Status: DC | PRN

## 2014-02-24 MED ORDER — BISACODYL 10 MG RE SUPP
10.0000 mg | Freq: Once | RECTAL | Status: AC
  Administered 2014-02-24: 10 mg via RECTAL
  Filled 2014-02-24 (×2): qty 1

## 2014-02-24 MED ORDER — SODIUM CHLORIDE 0.9 % IV SOLN
25.0000 mg | Freq: Once | INTRAVENOUS | Status: AC
  Administered 2014-02-24: 25 mg via INTRAVENOUS
  Filled 2014-02-24: qty 0.5

## 2014-02-24 MED ORDER — AMLODIPINE BESYLATE 10 MG PO TABS
10.0000 mg | ORAL_TABLET | Freq: Every day | ORAL | Status: DC
  Administered 2014-02-24: 10 mg via ORAL
  Filled 2014-02-24 (×2): qty 1
  Filled 2014-02-24: qty 2

## 2014-02-24 MED ORDER — SODIUM CHLORIDE 0.9 % IV SOLN
750.0000 mg | Freq: Once | INTRAVENOUS | Status: AC
  Administered 2014-02-24: 750 mg via INTRAVENOUS
  Filled 2014-02-24: qty 15

## 2014-02-24 MED ORDER — POLYETHYLENE GLYCOL 3350 17 G PO PACK
17.0000 g | PACK | Freq: Every day | ORAL | Status: DC
  Administered 2014-02-24: 17 g via ORAL
  Filled 2014-02-24 (×3): qty 1

## 2014-02-24 NOTE — Clinical Social Work Note (Signed)
Clinical Social Worker continuing to follow patient for support and discharge planning needs.  Patient remains agreeable with placement at [facility].  Per MD, patient to be ready for discharge tomorrow - facility agreeable and bed available.  CSW remains available for support and to facilitate patient discharge needs once medically ready.  [name], [place]

## 2014-02-24 NOTE — Progress Notes (Signed)
Nausea better now. Fulls. Great BS Patient examined and I agree with the assessment and plan  Georganna Skeans, MD, MPH, FACS Trauma: [phone] General Surgery: [phone]  02/24/2014 10:56 AM

## 2014-02-24 NOTE — Progress Notes (Signed)
MEDICATION RELATED CONSULT NOTE - INITIAL   Pharmacy Consult for IV iron Indication: anemia  No Known Allergies  Patient Measurements: Height: 5\' 10"  (177.8 cm) Weight: 244 lb 4.3 oz (110.8 kg) IBW/kg (Calculated) : 73   Vital Signs: Temp: 98 F (36.7 C) (07/02 0812) Temp src: Oral (07/02 0812) BP: 173/62 mmHg (07/02 0812) Pulse Rate: 102 (07/02 0812) Intake/Output from previous day: 07/01 0701 - 07/02 0700 In: 2370 [P.O.:1440; I.V.:930] Out: 1200 [Urine:1200] Intake/Output from this shift:    Labs:  Recent Labs  02/22/14 0710 02/23/14 0446 02/24/14 0338  WBC 3.0* 3.0*  --   HGB 8.1* 7.4* 8.2*  HCT 24.9* 23.2* 25.3*  PLT 195 185  --   CREATININE 1.19 1.26  --    Estimated Creatinine Clearance: 60.2 ml/min (by C-G formula based on Cr of 1.26).  Assessment: 14 YOM with bleeding cecal mass s/p lap colectomy on 6/23. With ABLA- hgb fell to 7.1 on 6/29. This morning hgb is 8.2. Patient received 2 units of PRBC on 6/20 and 1 unit of PRBC on 6/25 per records. Pharmacy asked to dose IV iron as patient has iron panel drawn this morning reveals iron <10, ferritin 18, and Tsat unable to be calculated. Iron repletion calculated using 14g/dL as desired hgb level.  Goal of Therapy:  Normalize hemoglobin levels  Plan:  1. iron dextran 86mL total repletion = ~1900mg . Therefore, will give test dose of iron dextran 25mg  IV x1 today; If patient tolerates this, proceed with giving iron dextran 750mg  IV x2 doses- one dose today and one tomorrow 2. Recommend repeat iron panel in about 1 month 3. CBC at least q72h 4. Pharmacy to sign off as no additional doses or labs needed. Please re-consult if needed  [name] D. Yelina Sarratt, PharmD, BCPS Clinical Pharmacist Pager: [phone] 02/24/2014 8:50 AM

## 2014-02-24 NOTE — Progress Notes (Signed)
Patient Demographics  [name], is a 80 y.o. male, DOB - 05-17-1934, DXA:[id number]  Admit date - 02/11/2014   Admitting Physician Toy Baker, MD  Outpatient Primary MD for the patient is No primary provider on file.  LOS - 16   Chief Complaint  Patient presents with  . Near Syncope      Brief narrative:    [name] is a 80 y.o. male with h/o DM, HTN, ischemic cardiomyopathy, Narcolepsy, obesity and poor compliance with seeing his PCP and taking his medications. He presented with dizziness, CP, sob and hematochezia with Hgb of 6.4. He was admitted in March of this year for CHF and noted to be very non-compliant with medications.He had heme positive stools in the ER with severe anemia. Dr. Collene Mares was consulted and colonoscopy revealed a cecal mass. General surgery was consulted and R-hemicolectomy was performed on 02/15/14. 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 Unfortunately, he has developed a post-op ileus which required NG placement. This was removed on 02/20/14. During the admission he also developed acute on chronic renal failure prompting d/c of furosemide and lisinopril. IVF given judiciously with improvement of renal function.   Per the records in [facility] it appears that his PCP ordered home health for him but he kicked them out of his home (see note from [name] on 5/29- telephone conversation) and so he was dismissed by them.    Subjective:   [name] today has, No headache, No chest pain, No abdominal pain - No Nausea, No new weakness tingling or numbness, No Cough - SOB. Having BMs now.   Assessment & Plan    Anemia - combination of AOCD + GI Bleed and iron deficiency (due to cecal adenocarcinoma) + Dilution  -Patient received 2 units PRBCs 02/11/2014 , Received  additional 2 units PRBCs 02/16/2014 , LDH haptoglobin and bilirubin unremarkable. -Anemia panel shows profound iron deficiency, will replace iron IV    Cecal mass  -02/13/2014 colonoscopy (Dr. Dalphine Handing mass, seen by general surgery and underwent right hemicolectomy on 02/15/2014 by Dr. Donne Hazel. No further rectal bleed. -Pathology = adenocarcinoma, 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 Wynelle Cleveland spoke with Dr. Marius Ditch states Gi onc nurse will arrange follow up as outpt with GI oncology       Post-op ileus  Resolving, having bowel movements, on clear liquid diet to be advanced by surgery.      Acute on chronic renal failure (CKD stage III)  -Resolved after holding Lasix and lisinopril, stable on oral diuretic now.    Chronic diastolic CHF with EF 20% -Compensated, on Coreg, home dose diuretic.     HTN  -BP has been labile-is noncompliant with medications. Has been conseled. On Coreg. Have adjusted Norvasc dose for better control.    Diabetes mellitus type 2  -Hemoglobin A1c 7.0 -- 02/12/2014  -Continue NovoLog sliding scale, Patient takes glipizide in the outpatient setting which has been held   CBG (last 3)   Recent Labs  02/23/14 1936 02/23/14 2332 02/24/14 0736  GLUCAP 145* 126* 138*   Lab Results  Component Value Date   HGBA1C 7.0* 02/12/2014      Code Status: Full  Family Communication: None present  Disposition Plan: SNF   Procedures  colonoscopy Dr. Collene Mares. Hemicolectomy Dr. Donne Hazel  Consults [facility], GI   Medications  Scheduled Meds: . amLODipine  5 mg Oral QHS  . antiseptic oral rinse  15 mL Mouth Rinse q12n4p  . bisacodyl  10 mg Rectal Once  . carvedilol  12.5 mg Oral BID WC  . chlorhexidine  15 mL Mouth Rinse BID  . heparin subcutaneous  5,000 Units Subcutaneous 3 times per day  . insulin aspart  0-9 Units Subcutaneous 6 times per day  . iron dextran (INFED/DEXFERRUM) infusion  25 mg Intravenous Once   Followed by  . iron dextran  (INFED/DEXFERRUM) infusion  750 mg Intravenous Once  . [START ON 02/25/2014] iron dextran (INFED/DEXFERRUM) infusion  750 mg Intravenous Once  . pantoprazole (PROTONIX) IV  40 mg Intravenous Q24H  . polyethylene glycol  17 g Oral Daily  . sodium chloride  3 mL Intravenous Q12H   Continuous Infusions:   PRN Meds:.sodium chloride, acetaminophen, hydrALAZINE, HYDROmorphone (DILAUDID) injection, ondansetron (ZOFRAN) IV, oxyCODONE-acetaminophen, sodium chloride, sodium chloride  DVT Prophylaxis    Heparin    Lab Results  Component Value Date   PLT 185 02/23/2014    Antibiotics     Anti-infectives   Start     Dose/Rate Route Frequency Ordered Stop   02/15/14 1400  cefoTEtan (CEFOTAN) 2 g in dextrose 5 % 50 mL IVPB     2 g 100 mL/hr over 30 Minutes Intravenous 3 times per day 02/15/14 1225 02/16/14 1359   02/15/14 0945  cefoTEtan (CEFOTAN) 2 g in dextrose 5 % 50 mL IVPB  Status:  Discontinued     2 g 100 mL/hr over 30 Minutes Intravenous To Surgery 02/15/14 0939 02/15/14 1215   02/15/14 0700  cefoTEtan (CEFOTAN) 2 g in dextrose 5 % 50 mL IVPB  Status:  Discontinued     2 g 100 mL/hr over 30 Minutes Intravenous  Once 02/15/14 0655 02/15/14 1005   02/15/14 0600  [MAR Hold]  cefOXitin (MEFOXIN) 2 g in dextrose 5 % 50 mL IVPB  Status:  Discontinued     (On MAR Hold since 02/15/14 0621)  Comments:  Pharmacy may adjust dosing strength, interval, or rate of medication as needed for optimal therapy for the patient Send with patient on call to the OR.  Anesthesia to complete antibiotic administration <22min prior to incision per [facility].   2 g 100 mL/hr over 30 Minutes Intravenous On call to O.R. 02/14/14 1207 02/15/14 0655          Objective:   Filed Vitals:   02/23/14 1447 02/23/14 2112 02/24/14 0523 02/24/14 0812  BP: 120/40 132/60 136/58 173/62  Pulse: 64 68 66 102  Temp: 98.2 F (36.8 C) 98.4 F (36.9 C) 98.3 F (36.8 C) 98 F (36.7 C)  TempSrc: Oral Oral Oral Oral  Resp:  14 16 17 16   Height:      Weight:   110.8 kg (244 lb 4.3 oz)   SpO2: 97% 98% 99% 99%    Wt Readings from Last 3 Encounters:  02/24/14 110.8 kg (244 lb 4.3 oz)  02/24/14 110.8 kg (244 lb 4.3 oz)  02/24/14 110.8 kg (244 lb 4.3 oz)     Intake/Output Summary (Last 24 hours) at 02/24/14 0911 Last data filed at 02/24/14 0133  Gross per 24 hour  Intake   2130 ml  Output    900 ml  Net   1230 ml     Physical Exam  Awake Alert, Oriented X 3, No new F.N deficits, Normal affect [place].AT,PERRAL Supple  Neck,No JVD, No cervical lymphadenopathy appriciated.  Symmetrical Chest wall movement, Good air movement bilaterally, CTAB RRR,No Gallops,Rubs or new Murmurs, No Parasternal Heave Hypoactive but +ve B.Sounds, Abd Soft, No tenderness, No organomegaly appriciated, No rebound - guarding or rigidity. No Cyanosis, Clubbing or edema, No new Rash or bruise      Data Review   Micro Results Recent Results (from the past 240 hour(s))  SURGICAL PCR SCREEN     Status: None   Collection Time    02/15/14  5:11 AM      Result Value Ref Range Status   MRSA, PCR NEGATIVE  NEGATIVE Final   Staphylococcus aureus NEGATIVE  NEGATIVE Final   Comment:            The Xpert SA Assay (FDA     approved for NASAL specimens     in patients over 50 years of age),     is one component of     a comprehensive surveillance     program.  Test performance has     been validated by [employer] for patients greater     than or equal to 97 year old.     It is not intended     to diagnose infection nor to     guide or monitor treatment.    Radiology Reports Dg Chest Portable 1 View  02/11/2014   CLINICAL DATA:  NEAR SYNCOPE  EXAM: PORTABLE CHEST - 1 VIEW  COMPARISON:  Prior radiograph from 12/08/2013  FINDINGS: Prominent cardiomegaly is stable as compared to prior exam. Mediastinal silhouette within normal limits. Few scattered atherosclerotic calcifications present within the aortic arch.  The lungs are  normally inflated. There is mild central perihilar pulmonary vascular congestion without overt pulmonary edema. No definite pleural effusion. No focal infiltrate identified. There is no pneumothorax.  No acute osseous abnormality identified.  IMPRESSION: Stable cardiomegaly with mild central perihilar vascular congestion without overt pulmonary edema.   Electronically Signed   By: Jeannine Boga M.D.   On: 02/11/2014 23:53   Dg Abd Portable 1v  02/23/2014   CLINICAL DATA:  Abdominal pain.  EXAM: PORTABLE ABDOMEN - 1 VIEW  COMPARISON:  Abdominal radiograph 02/19/2014.  FINDINGS: Gas is seen throughout the colon to at least the level of the descending colon. Several mildly dilated loops of gas-filled small bowel are seen projecting over the central abdomen measuring up to 4.1 cm in diameter. No gross evidence of pneumoperitoneum. Midline surgical staples are noted.  IMPRESSION: 1. Bowel gas pattern is again most suggestive of a postoperative ileus, as above.   Electronically Signed   By: Vinnie Langton M.D.   On: 02/23/2014 08:10   Dg Abd Portable 1v  02/19/2014   CLINICAL DATA:  Status post intestinal surgery several days ago. Evaluate dilated small bowel loops.  EXAM: PORTABLE ABDOMEN - 1 VIEW  COMPARISON:  One day prior  FINDINGS: Single supine view of the abdomen and pelvis. Midline laparotomy clips. Gaseous distention of large and small bowel loops is moderate. Example small bowel loop measures 4.0 cm in the left side of the abdomen, similar. Paucity of rectosigmoid gas. Moderate amount of gas throughout the colon.  No pneumatosis or free intraperitoneal air.  IMPRESSION: Similar large and small bowel distention, likely representing postoperative adynamic ileus. No free intraperitoneal air or other acute complication.   Electronically Signed   By: Abigail Miyamoto M.D.   On: 02/19/2014 16:14   Dg Abd Portable  1v  02/18/2014   CLINICAL DATA:  Ileus.  EXAM: PORTABLE ABDOMEN - 1 VIEW  COMPARISON:   Abdominal series 12 /08/2012.  FINDINGS: Distended loops of small and large bowel noted consistent adynamic ileus. No free air identified. Surgical staples are noted the abdomen. Degenerative changes lumbar spine and both hips.  IMPRESSION: Distended loops of small and large bowel consistent with adynamic ileus.   Electronically Signed   By: Marcello Moores  Register   On: 02/18/2014 10:30    CBC  Recent Labs Lab 02/19/14 0147 02/20/14 4742 02/21/14 0400 02/22/14 0710 02/23/14 0446 02/24/14 0338  WBC 5.7 3.2* 2.7* 3.0* 3.0*  --   HGB 8.9* 7.6* 7.1* 8.1* 7.4* 8.2*  HCT 27.9* 24.0* 22.0* 24.9* 23.2* 25.3*  PLT 216 207 183 195 185  --   MCV 83.3 83.3 82.1 83.3 82.0  --   MCH 26.6 26.4 26.5 27.1 26.1  --   MCHC 31.9 31.7 32.3 32.5 31.9  --   RDW 16.7* 16.6* 16.5* 16.6* 16.6*  --     Chemistries   Recent Labs Lab 02/17/14 1815 02/18/14 0421 02/20/14 0638 02/21/14 0400 02/22/14 0710 02/23/14 0446  NA  --  138 139 138 141 136*  K  --  4.4 4.1 4.0 4.1 4.1  CL  --  102 101 102 105 100  CO2  --  22 24 25 27 24   GLUCOSE  --  129* 97 132* 124* 135*  BUN  --  18 17 13 8 6   CREATININE  --  1.66* 1.31 1.17 1.19 1.26  CALCIUM  --  8.2* 8.4 8.0* 8.1* 8.1*  BILITOT 0.4  --   --   --   --   --    ------------------------------------------------------------------------------------------------------------------ estimated creatinine clearance is 60.2 ml/min (by C-G formula based on Cr of 1.26). ------------------------------------------------------------------------------------------------------------------ No results found for this basename: HGBA1C,  in the last 72 hours ------------------------------------------------------------------------------------------------------------------ No results found for this basename: CHOL, HDL, LDLCALC, TRIG, CHOLHDL, LDLDIRECT,  in the last 72  hours ------------------------------------------------------------------------------------------------------------------ No results found for this basename: TSH, T4TOTAL, FREET3, T3FREE, THYROIDAB,  in the last 72 hours ------------------------------------------------------------------------------------------------------------------  Recent Labs  02/23/14 1250  VITAMINB12 629  FOLATE 12.2  FERRITIN 18*  TIBC Not calculated due to Iron <10.  IRON <10*  RETICCTPCT 1.9    Coagulation profile No results found for this basename: INR, PROTIME,  in the last 168 hours  No results found for this basename: DDIMER,  in the last 72 hours  Cardiac Enzymes No results found for this basename: CK, CKMB, TROPONINI, MYOGLOBIN,  in the last 168 hours ------------------------------------------------------------------------------------------------------------------ No components found with this basename: POCBNP,      Time Spent in minutes   35   Rawlins Stuard K M.D on 02/24/2014 at 9:11 AM  Between 7am to 7pm - Pager - [phone]  After 7pm go to [email or web address] - password [email or web address]  And look for the night coverage person covering for me after hours  [facility] Office  [phone]   **Disclaimer: This note may have been dictated with voice recognition software. Similar sounding words can inadvertently be transcribed and this note may contain transcription errors which may not have been corrected upon publication of note.**

## 2014-02-24 NOTE — Progress Notes (Signed)
Pt is refusing suppository.  Pt states, "I''m not gonna let yall do anything to me if I don't start getting some real food.  I'm not taking that." I tried to explain to the pt what the suppository was for and why we are taking it slowly with food but pt does not care. Will continue to monitor and encourage pt. Syliva Overman

## 2014-02-24 NOTE — Progress Notes (Signed)
Patient ID: [name], male   DOB: 01-26-1934, 80 y.o.   MRN: [id number]   Subjective: Hungry, wants food, but had intermittent nausea, no vomiting.  No flatus.  Last BM 6/30.  Ambulating.  VSS.  Afebrile.   Objective:  Vital signs:  Filed Vitals:   02/23/14 0625 02/23/14 1447 02/23/14 2112 02/24/14 0523  BP: 140/53 120/40 132/60 136/58  Pulse: 66 64 68 66  Temp: 98.4 F (36.9 C) 98.2 F (36.8 C) 98.4 F (36.9 C) 98.3 F (36.8 C)  TempSrc: Oral Oral Oral Oral  Resp: 16 14 16 17   Height:      Weight: 248 lb 0.3 oz (112.5 kg)   244 lb 4.3 oz (110.8 kg)  SpO2: 98% 97% 98% 99%    Last BM Date: 02/22/14  Intake/Output   Yesterday:  07/01 0701 - 07/02 0700 In: 2370 [P.O.:1440; I.V.:930] Out: 1200 [Urine:1200] This shift:    I/O last 3 completed shifts: In: 3090 [P.O.:1440; I.V.:1650] Out: 6213 [Urine:1650]    Physical Exam:  General: Pt awake/alert/oriented x4 in no acute distress  Abdomen: Soft. Nondistended. +bs, appropriately tender. Midline incision-staples in place, edges are approximated, no drainage or erythema, healing lap sites. No evidence of peritonitis. No incarcerated hernias.   Problem List:   Principal Problem:   Acute GI bleeding Active Problems:   Diabetes mellitus   Obesity   CKD (chronic kidney disease), stage III   Anemia   Noncompliance with medications   Hypertensive urgency   Pedal edema   Colon tumor- not yet determined if cancerous    Colon cancer- adenocarcinoma   Acute on chronic renal failure   Acute blood loss anemia    Results:   Labs: Results for orders placed during the hospital encounter of 02/11/14 (from the past 48 hour(s))  GLUCOSE, CAPILLARY     Status: Abnormal   Collection Time    02/22/14 12:14 PM      Result Value Ref Range   Glucose-Capillary 174 (*) 70 - 99 mg/dL  GLUCOSE, CAPILLARY     Status: Abnormal   Collection Time    02/22/14  4:13 PM      Result Value Ref Range   Glucose-Capillary 125 (*) 70  - 99 mg/dL  GLUCOSE, CAPILLARY     Status: Abnormal   Collection Time    02/22/14  7:30 PM      Result Value Ref Range   Glucose-Capillary 123 (*) 70 - 99 mg/dL   Comment 1 Notify RN    GLUCOSE, CAPILLARY     Status: Abnormal   Collection Time    02/23/14 12:21 AM      Result Value Ref Range   Glucose-Capillary 127 (*) 70 - 99 mg/dL   Comment 1 Notify RN    GLUCOSE, CAPILLARY     Status: Abnormal   Collection Time    02/23/14  4:07 AM      Result Value Ref Range   Glucose-Capillary 130 (*) 70 - 99 mg/dL   Comment 1 Notify RN    CBC     Status: Abnormal   Collection Time    02/23/14  4:46 AM      Result Value Ref Range   WBC 3.0 (*) 4.0 - 10.5 K/uL   RBC 2.83 (*) 4.22 - 5.81 MIL/uL   Hemoglobin 7.4 (*) 13.0 - 17.0 g/dL   HCT 23.2 (*) 39.0 - 52.0 %   MCV 82.0  78.0 - 100.0 fL   MCH 26.1  26.0 - 34.0 pg   MCHC 31.9  30.0 - 36.0 g/dL   RDW 16.6 (*) 11.5 - 15.5 %   Platelets 185  150 - 400 K/uL  BASIC METABOLIC PANEL     Status: Abnormal   Collection Time    02/23/14  4:46 AM      Result Value Ref Range   Sodium 136 (*) 137 - 147 mEq/L   Potassium 4.1  3.7 - 5.3 mEq/L   Chloride 100  96 - 112 mEq/L   CO2 24  19 - 32 mEq/L   Glucose, Bld 135 (*) 70 - 99 mg/dL   BUN 6  6 - 23 mg/dL   Creatinine, Ser 1.26  0.50 - 1.35 mg/dL   Calcium 8.1 (*) 8.4 - 10.5 mg/dL   GFR calc non Af Amer 53 (*) >90 mL/min   GFR calc Af Amer 61 (*) >90 mL/min   Comment: (NOTE)     The eGFR has been calculated using the CKD EPI equation.     This calculation has not been validated in all clinical situations.     eGFR's persistently <90 mL/min signify possible Chronic Kidney     Disease.  GLUCOSE, CAPILLARY     Status: Abnormal   Collection Time    02/23/14  8:10 AM      Result Value Ref Range   Glucose-Capillary 135 (*) 70 - 99 mg/dL  GLUCOSE, CAPILLARY     Status: Abnormal   Collection Time    02/23/14 11:50 AM      Result Value Ref Range   Glucose-Capillary 133 (*) 70 - 99 mg/dL  VITAMIN  B12     Status: None   Collection Time    02/23/14 12:50 PM      Result Value Ref Range   Vitamin B-12 629  211 - 911 pg/mL   Comment: Performed at [facility]     Status: None   Collection Time    02/23/14 12:50 PM      Result Value Ref Range   Folate 12.2     Comment: (NOTE)     Reference Ranges            Deficient:       0.4 - 3.3 ng/mL            Indeterminate:   3.4 - 5.4 ng/mL            Normal:              > 5.4 ng/mL     Performed at [facility] TIBC     Status: Abnormal   Collection Time    02/23/14 12:50 PM      Result Value Ref Range   Iron <10 (*) 42 - 135 ug/dL   TIBC Not calculated due to Iron <10.  215 - 435 ug/dL   Saturation Ratios Not calculated due to Iron <10.  20 - 55 %   UIBC 232  125 - 400 ug/dL   Comment: Performed at [facility]     Status: Abnormal   Collection Time    02/23/14 12:50 PM      Result Value Ref Range   Ferritin 18 (*) 22 - 322 ng/mL   Comment: Performed at [facility]     Status: Abnormal   Collection Time    02/23/14 12:50 PM      Result Value  Ref Range   Retic Ct Pct 1.9  0.4 - 3.1 %   RBC. 2.86 (*) 4.22 - 5.81 MIL/uL   Retic Count, Manual 54.3  19.0 - 186.0 K/uL  GLUCOSE, CAPILLARY     Status: Abnormal   Collection Time    02/23/14  4:10 PM      Result Value Ref Range   Glucose-Capillary 170 (*) 70 - 99 mg/dL  GLUCOSE, CAPILLARY     Status: Abnormal   Collection Time    02/23/14  7:36 PM      Result Value Ref Range   Glucose-Capillary 145 (*) 70 - 99 mg/dL   Comment 1 Notify RN    GLUCOSE, CAPILLARY     Status: Abnormal   Collection Time    02/23/14 11:32 PM      Result Value Ref Range   Glucose-Capillary 126 (*) 70 - 99 mg/dL   Comment 1 Notify RN    HEMOGLOBIN AND HEMATOCRIT, BLOOD     Status: Abnormal   Collection Time    02/24/14  3:38 AM      Result Value Ref Range   Hemoglobin 8.2 (*) 13.0 - 17.0 g/dL   HCT 25.3 (*) 39.0 - 52.0  %  GLUCOSE, CAPILLARY     Status: Abnormal   Collection Time    02/24/14  7:36 AM      Result Value Ref Range   Glucose-Capillary 138 (*) 70 - 99 mg/dL    Imaging / Studies: Dg Abd Portable 1v  02/23/2014   CLINICAL DATA:  Abdominal pain.  EXAM: PORTABLE ABDOMEN - 1 VIEW  COMPARISON:  Abdominal radiograph 02/19/2014.  FINDINGS: Gas is seen throughout the colon to at least the level of the descending colon. Several mildly dilated loops of gas-filled small bowel are seen projecting over the central abdomen measuring up to 4.1 cm in diameter. No gross evidence of pneumoperitoneum. Midline surgical staples are noted.  IMPRESSION: 1. Bowel gas pattern is again most suggestive of a postoperative ileus, as above.   Electronically Signed   By: Vinnie Langton M.D.   On: 02/23/2014 08:10    Scheduled Meds: . amLODipine  5 mg Oral QHS  . antiseptic oral rinse  15 mL Mouth Rinse q12n4p  . bisacodyl  10 mg Rectal Once  . carvedilol  12.5 mg Oral BID WC  . chlorhexidine  15 mL Mouth Rinse BID  . heparin subcutaneous  5,000 Units Subcutaneous 3 times per day  . insulin aspart  0-9 Units Subcutaneous 6 times per day  . pantoprazole (PROTONIX) IV  40 mg Intravenous Q24H  . polyethylene glycol  17 g Oral Daily  . sodium chloride  3 mL Intravenous Q12H   Continuous Infusions:  PRN Meds:.sodium chloride, acetaminophen, hydrALAZINE, HYDROmorphone (DILAUDID) injection, ondansetron (ZOFRAN) IV, oxyCODONE-acetaminophen, sodium chloride, sodium chloride   Antibiotics: Anti-infectives   Start     Dose/Rate Route Frequency Ordered Stop   02/15/14 1400  cefoTEtan (CEFOTAN) 2 g in dextrose 5 % 50 mL IVPB     2 g 100 mL/hr over 30 Minutes Intravenous 3 times per day 02/15/14 1225 02/16/14 1359   02/15/14 0945  cefoTEtan (CEFOTAN) 2 g in dextrose 5 % 50 mL IVPB  Status:  Discontinued     2 g 100 mL/hr over 30 Minutes Intravenous To Surgery 02/15/14 0939 02/15/14 1215   02/15/14 0700  cefoTEtan (CEFOTAN) 2 g  in dextrose 5 % 50 mL IVPB  Status:  Discontinued  2 g 100 mL/hr over 30 Minutes Intravenous  Once 02/15/14 0655 02/15/14 1005   02/15/14 0600  [MAR Hold]  cefOXitin (MEFOXIN) 2 g in dextrose 5 % 50 mL IVPB  Status:  Discontinued     (On MAR Hold since 02/15/14 0621)  Comments:  Pharmacy may adjust dosing strength, interval, or rate of medication as needed for optimal therapy for the patient Send with patient on call to the OR.  Anesthesia to complete antibiotic administration <35mn [facility]   2 g 100 mL/hr over 30 Minutes Intravenous On call to O.R. 02/14/14 1207 02/15/14 01224     Assessment/Plan  bleeding cecal mass; s/p laparoscopic assisted right colectomy, Surgeon: Dr MSerita Grammes 02/15/14  Post operative ileus  POD#9  -Resolving, full liquid diet -give dulcolax x1, miralax daily -IS(pulling 1750)  -PT/OT, up to chair  -pain control  -IV hydration  -SCD/heparin  -remove staples in 1-2  days  ABL anemia-transfused 4 units. Hgb stable Hx of CM/CHF with last EF 60-65% (11/13/13)-SLIV Medical non compliance-refusing to take home meds  CRI stage III renal disease  Hypertension  AODM   EErby Pian [facility] Pager [phone] [phone] 02/24/2014 7:40 AM

## 2014-02-25 MED ORDER — POTASSIUM CHLORIDE ER 10 MEQ PO TBCR
10.0000 meq | EXTENDED_RELEASE_TABLET | Freq: Every day | ORAL | Status: DC
Start: 2014-02-25 — End: 2014-05-21

## 2014-02-25 MED ORDER — AMLODIPINE BESYLATE 10 MG PO TABS: 10.0000 mg | ORAL_TABLET | Freq: Every day | ORAL | Status: DC

## 2014-02-25 MED ORDER — CARVEDILOL 12.5 MG PO TABS: 12.5000 mg | ORAL_TABLET | Freq: Two times a day (BID) | ORAL | Status: DC

## 2014-02-25 MED ORDER — FERROUS SULFATE 325 (65 FE) MG PO TABS: 325.0000 mg | ORAL_TABLET | Freq: Three times a day (TID) | ORAL | Status: DC

## 2014-02-25 MED ORDER — POLYETHYLENE GLYCOL 3350 17 G PO PACK: 17.0000 g | PACK | Freq: Every day | ORAL | Status: DC

## 2014-02-25 MED ORDER — INSULIN ASPART 100 UNIT/ML ~~LOC~~ SOLN: SUBCUTANEOUS | Status: DC

## 2014-02-25 MED ORDER — FUROSEMIDE 10 MG/ML IJ SOLN
20.0000 mg | Freq: Once | INTRAMUSCULAR | Status: DC
  Filled 2014-02-25: qty 2

## 2014-02-25 MED ORDER — OXYCODONE-ACETAMINOPHEN 5-325 MG PO TABS: 1.0000 | ORAL_TABLET | ORAL | Status: DC | PRN

## 2014-02-25 NOTE — Progress Notes (Signed)
Patient ID: [name], male   DOB: 08/24/1934, 80 y.o.   MRN: [id number]  [facility] - Progress Note  POD# 10  Subjective: Patient seated at bedside.  Tolerating full liquid diet.  Denies BM or gas except with suppository.  Objective: Vital signs in last 24 hours: Temp:  [97.5 F (36.4 C)-98.3 F (36.8 C)] 97.5 F (36.4 C) (07/03 0539) Pulse Rate:  [65-102] 72 (07/03 0539) Resp:  [14-18] 18 (07/03 0539) BP: (130-186)/(49-72) 152/68 mmHg (07/03 0539) SpO2:  [98 %-100 %] 98 % (07/03 0539) Weight:  [239 lb 6.7 oz (108.6 kg)] 239 lb 6.7 oz (108.6 kg) (07/03 0500) Last BM Date: 02/24/14  Intake/Output from previous day: 07/02 0701 - 07/03 0700 In: 1710 [P.O.:1710] Out: 2000 [Urine:2000]  Exam: HEENT - clear, not icteric Neck - soft Chest - clear bilaterally Cor - RRR, no murmur Abd - soft without distension; wounds clear and dry with staples; rare BS present; no tenderness   Lab Results:   Recent Labs  02/23/14 0446 02/24/14 0338  WBC 3.0*  --   HGB 7.4* 8.2*  HCT 23.2* 25.3*  PLT 185  --      Recent Labs  02/23/14 0446  NA 136*  K 4.1  CL 100  CO2 24  GLUCOSE 135*  BUN 6  CREATININE 1.26  CALCIUM 8.1*    Studies/Results: Dg Abd Portable 1v  02/23/2014   CLINICAL DATA:  Abdominal pain.  EXAM: PORTABLE ABDOMEN - 1 VIEW  COMPARISON:  Abdominal radiograph 02/19/2014.  FINDINGS: Gas is seen throughout the colon to at least the level of the descending colon. Several mildly dilated loops of gas-filled small bowel are seen projecting over the central abdomen measuring up to 4.1 cm in diameter. No gross evidence of pneumoperitoneum. Midline surgical staples are noted.  IMPRESSION: 1. Bowel gas pattern is again most suggestive of a postoperative ileus, as above.   Electronically Signed   By: Vinnie Langton M.D.   On: 02/23/2014 08:10    Assessment / Plan: 1.  Status post lap assisted right colectomy  Advance to regular  diet  Remove staples and apply steristrips  Encourage OOB, ambulation  Disposition per medical service  Earnstine Regal, MD, [facility] Office: [phone]  02/25/2014

## 2014-02-25 NOTE — Discharge Summary (Addendum)
[name], is a 80 y.o. male  DOB 1934/04/09  MRN [id number].  Admission date:  02/11/2014  Admitting Physician  Toy Baker, MD  Discharge Date:  02/25/2014   Primary MD  No primary provider on file.  Recommendations for primary care physician for things to follow:   Repeat CBC, BMP and a 2 view chest x-ray in 3 days.  Monitor weight, BMP, iron panel and diuretic dose closely.   Admission Diagnosis  Acute GI bleeding [578.9] Acute exacerbation of CHF (congestive heart failure) [428.0] CHF exacerbation [428.0] Type 2 diabetes mellitus with renal manifestations, controlled [250.40] Noncompliance with medications [V15.81] Anemia, unspecified anemia type [285.9]   Discharge Diagnosis  Acute GI bleeding [578.9] Acute exacerbation of CHF (congestive heart failure) [428.0] CHF exacerbation [428.0] Type 2 diabetes mellitus with renal manifestations, controlled [250.40] Noncompliance with medications [V15.81] Anemia, unspecified anemia type [285.9]   Gangrenous cholecystitis  Principal Problem:   Acute GI bleeding Active Problems:   Diabetes mellitus   Obesity   CKD (chronic kidney disease), stage III   Anemia   Noncompliance with medications   Hypertensive urgency   Pedal edema   Colon tumor- not yet determined if cancerous    Colon cancer- adenocarcinoma   Acute on chronic renal failure   Acute blood loss anemia      Past Medical History  Diagnosis Date  . Smoker   . Narcolepsy   . Hypertension   . BPH (benign prostatic hyperplasia)   . Arthritis   . Diabetes mellitus   . Neuropathy in diabetes   . Herpes   . Dyslipidemia   . Obesity   . Lymphedema     LE lymphedemia with hx of cellulitis.  . CKD (chronic kidney disease)   . Anemia   . History of GI bleed 06/2012    EGD unremarkable;  Colo  with large cecal polyp (bx benign)  . Ischemic cardiomyopathy     Echo 06/19/12: EF 40-45%, mid to distal anteroseptal HK, moderate LAE, PASP 37  . Chronic systolic heart failure   . CAD (coronary artery disease)     a. probable underlying CAD;  b. Type 2 NSTEMi in setting of profound anemia (Hgb 3.6) from GI bleed in 06/2012 and WMA on echo;  c. med Rx due to CKD and GI bleed  . CHF (congestive heart failure)   . Atrial fibrillation     Past Surgical History  Procedure Laterality Date  . Leg surgery      Left leg surgery. broken leg  . Esophagogastroduodenoscopy  06/19/2012    Procedure: ESOPHAGOGASTRODUODENOSCOPY (EGD);  Surgeon: Beryle Beams, MD;  Location: [facility] ENDOSCOPY;  Service: Endoscopy;  Laterality: N/A;  . Colonoscopy  06/21/2012    Procedure: COLONOSCOPY;  Surgeon: Juanita Craver, MD;  Location: [facility] ENDOSCOPY;  Service: Endoscopy;  Laterality: N/A;  . Colonoscopy Left 02/13/2014    Procedure: COLONOSCOPY;  Surgeon: Juanita Craver, MD;  Location: [facility];  Service: Endoscopy;  Laterality: Left;  . Laparoscopic right colectomy Right 02/15/2014  Procedure: LAPAROSCOPIC ASISSTED RIGHT  COLECTOMY, POSSIBLE OPEN;  Surgeon: Rolm Bookbinder, MD;  Location: [facility];  Service: General;  Laterality: Right;       History of present illness and  Hospital Course:     Kindly see H&P for history of present illness and admission details, please review complete Labs, Consult reports and Test reports for all details in brief  HPI  [name] is a 80 y.o. male with h/o DM, HTN, ischemic cardiomyopathy, Narcolepsy, obesity and poor compliance with seeing his PCP and taking his medications. He presented with dizziness, CP, sob and hematochezia with Hgb of 6.4. He was admitted in March of this year for CHF and noted to be very non-compliant with medications.He had heme positive stools in the ER with severe anemia. Dr. Collene Mares was consulted and colonoscopy revealed a cecal mass. General  surgery was consulted and R-hemicolectomy was performed on 02/15/14. 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 Unfortunately, he has developed a post-op ileus which required NG placement. This was removed on 02/20/14. During the admission he also developed acute on chronic renal failure prompting d/c of furosemide and lisinopril. IVF given judiciously with improvement of renal function.       Hospital Course    Anemia - combination of AOCD + GI Bleed and iron deficiency (due to cecal adenocarcinoma) + Dilution  -Patient received 2 units PRBCs 02/11/2014 , Received additional 2 units PRBCs 02/16/2014 , LDH haptoglobin and bilirubin unremarkable.  -Anemia panel shows profound iron deficiency, but IV iron replacement and will be placed on oral iron. -On it her CBC and iron panel closely in the outpatient setting.    Cecal mass  -02/13/2014 colonoscopy (Dr. Dalphine Handing mass, seen by general surgery and underwent right hemicolectomy on 02/15/2014 by Dr. Donne Hazel. No further rectal bleed.  -Pathology = adenocarcinoma, lymph nodes negative . -Dr. Wynelle Cleveland spoke with Dr. Bary Castilla to follow with her within a week. Kindly call  and make arrangements.    Post-op ileus  Resolved   Acute on chronic renal failure (CKD stage III)  -Resolved after holding Lasix and lisinopril, started back on home dose diuretic. ACE inhibitor on hold.    Chronic diastolic CHF with EF 63%  -Compensated, on Coreg, home dose diuretic.     HTN  -BP has been labile-is noncompliant with medications. Has been conseled.  On higher than home dose Coreg along with Norvasc which was added with good control.     Diabetes mellitus type 2  -Hemoglobin A1c 7.0 -- 02/12/2014  -Continue home oral hypoglycemic agent along with sliding scale Q. a.c. at bedtime. Monitor sugars closely.      Discharge Condition: Stable   Follow UP  Follow-up Information   Follow up with [facility], NI, MD. Schedule an appointment as soon as  possible for a visit in 1 week.   Specialty:  Hematology and Oncology   Contact information:   [place] [id number] [phone]       Follow up with [facility]    . Schedule an appointment as soon as possible for a visit in 1 week.   Contact information:   [place] [address] [phone]      Follow up with [facility],MD, MD. Schedule an appointment as soon as possible for a visit in 1 week.   Specialty:  General Surgery      Follow up with PCP or SNF MD. Schedule an appointment as soon as possible for a visit in  3 days.        Discharge Instructions  and  Discharge Medications         Discharge Instructions   Discharge instructions    Complete by:  As directed   Follow with Primary MD or SNF M.D. in 3 days   Get CBC, CMP, 2 view Chest X ray checked  in 3 days.   Activity: As tolerated with Full fall precautions use walker/cane & assistance as needed   Disposition SNF   Diet: Heart Healthy - low carbohydrate. 1.8 L fluid/day restriction.  Accu-Cheks q. a.c. at bedtime  For Heart failure patients - Check your Weight same time everyday, if you gain over 2 pounds, or you develop in leg swelling, experience more shortness of breath or chest pain, call your Primary MD immediately. Follow Cardiac Low Salt Diet and 1.8 lit/day fluid restriction.   On your next visit with her primary care physician please Get Medicines reviewed and adjusted.  Please request your Prim.MD to go over all Hospital Tests and Procedure/Radiological results at the follow up, please get all Hospital records sent to your Prim MD by signing hospital release before you go home.   If you experience worsening of your admission symptoms, develop shortness of breath, life threatening emergency, suicidal or homicidal thoughts you must seek medical attention immediately by calling 911 or calling your MD immediately  if symptoms less  severe.  You Must read complete instructions/literature along with all the possible adverse reactions/side effects for all the Medicines you take and that have been prescribed to you. Take any new Medicines after you have completely understood and accpet all the possible adverse reactions/side effects.   Do not drive, operating heavy machinery, perform activities at heights, swimming or participation in water activities or provide baby sitting services if your were admitted for syncope or siezures until you have seen by Primary MD or a Neurologist and advised to do so again.  Do not drive when taking Pain medications.    Do not take more than prescribed Pain, Sleep and Anxiety Medications  Special Instructions: If you have smoked or chewed Tobacco  in the last 2 yrs please stop smoking, stop any regular Alcohol  and or any Recreational drug use.  Wear Seat belts while driving.   Please note  You were cared for by a hospitalist during your hospital stay. If you have any questions about your discharge medications or the care you received while you were in the hospital after you are discharged, you can call the unit and asked to speak with the hospitalist on call if the hospitalist that took care of you is not available. Once you are discharged, your primary care physician will handle any further medical issues. Please note that NO REFILLS for any discharge medications will be authorized once you are discharged, as it is imperative that you return to your primary care physician (or establish a relationship with a primary care physician if you do not have one) for your aftercare needs so that they can reassess your need for medications and monitor your lab values.  Follow with Primary MD or SNF M.D. in 3 days   Get CBC, CMP, 2 view Chest X ray checked  in 3 days.   Activity: As tolerated with Full fall precautions use walker/cane & assistance as needed   Disposition SNF   Diet: Heart  Healthy - low carbohydrate. 1.8 L fluid/day restriction.  Accu-Cheks q. a.c. at bedtime  For Heart failure  patients - Check your Weight same time everyday, if you gain over 2 pounds, or you develop in leg swelling, experience more shortness of breath or chest pain, call your Primary MD immediately. Follow Cardiac Low Salt Diet and 1.8 lit/day fluid restriction.   On your next visit with her primary care physician please Get Medicines reviewed and adjusted.  Please request your Prim.MD to go over all Hospital Tests and Procedure/Radiological results at the follow up, please get all Hospital records sent to your Prim MD by signing hospital release before you go home.   If you experience worsening of your admission symptoms, develop shortness of breath, life threatening emergency, suicidal or homicidal thoughts you must seek medical attention immediately by calling 911 or calling your MD immediately  if symptoms less severe.  You Must read complete instructions/literature along with all the possible adverse reactions/side effects for all the Medicines you take and that have been prescribed to you. Take any new Medicines after you have completely understood and accpet all the possible adverse reactions/side effects.   Do not drive, operating heavy machinery, perform activities at heights, swimming or participation in water activities or provide baby sitting services if your were admitted for syncope or siezures until you have seen by Primary MD or a Neurologist and advised to do so again.  Do not drive when taking Pain medications.    Do not take more than prescribed Pain, Sleep and Anxiety Medications  Special Instructions: If you have smoked or chewed Tobacco  in the last 2 yrs please stop smoking, stop any regular Alcohol  and or any Recreational drug use.  Wear Seat belts while driving.   Please note  You were cared for by a hospitalist during your hospital stay. If you have any  questions about your discharge medications or the care you received while you were in the hospital after you are discharged, you can call the unit and asked to speak with the hospitalist on call if the hospitalist that took care of you is not available. Once you are discharged, your primary care physician will handle any further medical issues. Please note that NO REFILLS for any discharge medications will be authorized once you are discharged, as it is imperative that you return to your primary care physician (or establish a relationship with a primary care physician if you do not have one) for your aftercare needs so that they can reassess your need for medications and monitor your lab values.     Increase activity slowly    Complete by:  As directed             Medication List    STOP taking these medications       lisinopril 5 MG tablet  Commonly known as:  PRINIVIL,ZESTRIL      TAKE these medications       amLODipine 10 MG tablet  Commonly known as:  NORVASC  Take 1 tablet (10 mg total) by mouth at bedtime.     carvedilol 12.5 MG tablet  Commonly known as:  COREG  Take 1 tablet (12.5 mg total) by mouth 2 (two) times daily with a meal.     ferrous sulfate 325 (65 FE) MG tablet  Take 1 tablet (325 mg total) by mouth 3 (three) times daily with meals.     furosemide 40 MG tablet  Commonly known as:  LASIX  Take 1 tablet (40 mg total) by mouth daily.     glipiZIDE 2.5 mg  Tabs tablet  Commonly known as:  GLUCOTROL  Take 0.5 tablets (2.5 mg total) by mouth daily before breakfast.     insulin aspart 100 UNIT/ML injection  Commonly known as:  novoLOG  Before each meal 3 times a day, 140-199 - 2 units, 200-250 - 4 units, 251-299 - 6 units,  300-349 - 8 units,  350 or above 10 units.     oxyCODONE-acetaminophen 5-325 MG per tablet  Commonly known as:  PERCOCET/ROXICET  Take 1 tablet by mouth every 4 (four) hours as needed for moderate pain.     polyethylene glycol packet    Commonly known as:  MIRALAX / GLYCOLAX  Take 17 g by mouth daily.     potassium chloride 10 MEQ tablet  Commonly known as:  K-DUR  Take 1 tablet (10 mEq total) by mouth daily.          Diet and Activity recommendation: See Discharge Instructions above   Consults obtained - [facility]   Major procedures and Radiology Reports - PLEASE review detailed and final reports for all details, in brief -     colonoscopy Dr. Collene Mares. Hemicolectomy Dr. Donne [facility]     Dg Chest Portable 1 View  02/11/2014   CLINICAL DATA:  NEAR SYNCOPE  EXAM: PORTABLE CHEST - 1 VIEW  COMPARISON:  Prior radiograph from 12/08/2013  FINDINGS: Prominent cardiomegaly is stable as compared to prior exam. Mediastinal silhouette within normal limits. Few scattered atherosclerotic calcifications present within the aortic arch.  The lungs are normally inflated. There is mild central perihilar pulmonary vascular congestion without overt pulmonary edema. No definite pleural effusion. No focal infiltrate identified. There is no pneumothorax.  No acute osseous abnormality identified.  IMPRESSION: Stable cardiomegaly with mild central perihilar vascular congestion without overt pulmonary edema.   Electronically Signed   By: Jeannine Boga M.D.   On: 02/11/2014 23:53   Dg Abd Portable 1v  02/23/2014   CLINICAL DATA:  Abdominal pain.  EXAM: PORTABLE ABDOMEN - 1 VIEW  COMPARISON:  Abdominal radiograph 02/19/2014.  FINDINGS: Gas is seen throughout the colon to at least the level of the descending colon. Several mildly dilated loops of gas-filled small bowel are seen projecting over the central abdomen measuring up to 4.1 cm in diameter. No gross evidence of pneumoperitoneum. Midline surgical staples are noted.  IMPRESSION: 1. Bowel gas pattern is again most suggestive of a postoperative ileus, as above.   Electronically Signed   By: Vinnie Langton M.D.   On: 02/23/2014 08:10   Dg Abd Portable 1v  02/19/2014   CLINICAL DATA:  Status  post intestinal surgery several days ago. Evaluate dilated small bowel loops.  EXAM: PORTABLE ABDOMEN - 1 VIEW  COMPARISON:  One day prior  FINDINGS: Single supine view of the abdomen and pelvis. Midline laparotomy clips. Gaseous distention of large and small bowel loops is moderate. Example small bowel loop measures 4.0 cm in the left side of the abdomen, similar. Paucity of rectosigmoid gas. Moderate amount of gas throughout the colon.  No pneumatosis or free intraperitoneal air.  IMPRESSION: Similar large and small bowel distention, likely representing postoperative adynamic ileus. No free intraperitoneal air or other acute complication.   Electronically Signed   By: Abigail Miyamoto M.D.   On: 02/19/2014 16:14   Dg Abd Portable 1v  02/18/2014   CLINICAL DATA:  Ileus.  EXAM: PORTABLE ABDOMEN - 1 VIEW  COMPARISON:  Abdominal series 12 /08/2012.  FINDINGS: Distended loops of small and large bowel noted  consistent adynamic ileus. No free air identified. Surgical staples are noted the abdomen. Degenerative changes lumbar spine and both hips.  IMPRESSION: Distended loops of small and large bowel consistent with adynamic ileus.   Electronically Signed   By: Marcello Moores  Register   On: 02/18/2014 10:30    Micro Results     No results found for this or any previous visit (from the past 240 hour(s)).     Today   Subjective:   [name] today has no headache,no chest abdominal pain,no new weakness tingling or numbness, feels much better.  Objective:   Blood pressure 152/68, pulse 72, temperature 97.5 F (36.4 C), temperature source Oral, resp. rate 18, height 5\' 10"  (1.778 m), weight 108.6 kg (239 lb 6.7 oz), SpO2 98.00%.   Intake/Output Summary (Last 24 hours) at 02/25/14 0941 Last data filed at 02/25/14 0500  Gross per 24 hour  Intake   1470 ml  Output   2000 ml  Net   -530 ml    Exam Awake Alert, Oriented x 3, No new F.N deficits, Normal affect [place].AT,PERRAL Supple Neck,No JVD, No  cervical lymphadenopathy appriciated.  Symmetrical Chest wall movement, Good air movement bilaterally, CTAB RRR,No Gallops,Rubs or new Murmurs, No Parasternal Heave +ve B.Sounds, Abd Soft, Non tender, No organomegaly appriciated, No rebound -guarding or rigidity. No Cyanosis, Clubbing or edema, No new Rash or bruise  Data Review   CBC w Diff: Lab Results  Component Value Date   WBC 3.0* 02/23/2014   HGB 8.2* 02/24/2014   HCT 25.3* 02/24/2014   PLT 185 02/23/2014   LYMPHOPCT 26 12/08/2013   MONOPCT 16* 12/08/2013   EOSPCT 1 12/08/2013   BASOPCT 0 12/08/2013    CMP: Lab Results  Component Value Date   NA 136* 02/23/2014   K 4.1 02/23/2014   CL 100 02/23/2014   CO2 24 02/23/2014   BUN 6 02/23/2014   CREATININE 1.26 02/23/2014   CREATININE 1.56* 10/07/2013   PROT 6.5 02/11/2014   ALBUMIN 3.0* 02/11/2014   BILITOT 0.4 02/17/2014   ALKPHOS 57 02/11/2014   AST 8 02/11/2014   ALT <5 02/11/2014  .   Total Time in preparing paper work, data evaluation and todays exam - 35 minutes  Thurnell Lose M.D on 02/25/2014 at 9:41 AM  [facility] Office  [phone]   **Disclaimer: This note may have been dictated with voice recognition software. Similar sounding words can inadvertently be transcribed and this note may contain transcription errors which may not have been corrected upon publication of note.**

## 2014-02-25 NOTE — Discharge Instructions (Signed)
Follow with Primary MD or SNF M.D. in 3 days   Get CBC, CMP, 2 view Chest X ray checked in 3 days   Activity: As tolerated with Full fall precautions use walker/cane & assistance as needed   Disposition SNF   Diet: Heart Healthy - low carbohydrate. 1.8 L fluid/day restriction.  Accu-Cheks q. a.c. at bedtime  For Heart failure patients - Check your Weight same time everyday, if you gain over 2 pounds, or you develop in leg swelling, experience more shortness of breath or chest pain, call your Primary MD immediately. Follow Cardiac Low Salt Diet and 1.8 lit/day fluid restriction.   On your next visit with her primary care physician please Get Medicines reviewed and adjusted.  Please request your Prim.MD to go over all Hospital Tests and Procedure/Radiological results at the follow up, please get all Hospital records sent to your Prim MD by signing hospital release before you go home.   If you experience worsening of your admission symptoms, develop shortness of breath, life threatening emergency, suicidal or homicidal thoughts you must seek medical attention immediately by calling 911 or calling your MD immediately  if symptoms less severe.  You Must read complete instructions/literature along with all the possible adverse reactions/side effects for all the Medicines you take and that have been prescribed to you. Take any new Medicines after you have completely understood and accpet all the possible adverse reactions/side effects.   Do not drive, operating heavy machinery, perform activities at heights, swimming or participation in water activities or provide baby sitting services if your were admitted for syncope or siezures until you have seen by Primary MD or a Neurologist and advised to do so again.  Do not drive when taking Pain medications.    Do not take more than prescribed Pain, Sleep and Anxiety Medications  Special Instructions: If you have smoked or chewed Tobacco  in the  last 2 yrs please stop smoking, stop any regular Alcohol  and or any Recreational drug use.  Wear Seat belts while driving.   Please note  You were cared for by a hospitalist during your hospital stay. If you have any questions about your discharge medications or the care you received while you were in the hospital after you are discharged, you can call the unit and asked to speak with the hospitalist on call if the hospitalist that took care of you is not available. Once you are discharged, your primary care physician will handle any further medical issues. Please note that NO REFILLS for any discharge medications will be authorized once you are discharged, as it is imperative that you return to your primary care physician (or establish a relationship with a primary care physician if you do not have one) for your aftercare needs so that they can reassess your need for medications and monitor your lab values.

## 2014-02-25 NOTE — Clinical Social Work Note (Signed)
Per MD patient ready to DC to [facility]. RN, patient, daughter, and facility notified of DC. RN given number for report (on dc packet). Next available transport requested for patient. CSW signing off at this time.   Liz Beach MSW, [place], [place], [phone]

## 2014-02-25 NOTE — Progress Notes (Signed)
Patient discharged to [facility].  Patient alert, oriented, verbally responsive, breathing regular and non-labored throughout, no s/s of distress noted throughout, no c/o pain throughout.  Patient left unit per stretcher accompanied by ambulance staff members.  VS WNL.  Dirk Dress 02/25/2014 11:16 AM

## 2014-02-28 ENCOUNTER — Encounter: Payer: Self-pay | Admitting: Internal Medicine

## 2014-02-28 ENCOUNTER — Non-Acute Institutional Stay (SKILLED_NURSING_FACILITY): Payer: Medicare Other | Admitting: Internal Medicine

## 2014-02-28 DIAGNOSIS — R600 Localized edema: Secondary | ICD-10-CM

## 2014-02-28 DIAGNOSIS — N183 Chronic kidney disease, stage 3 unspecified: Secondary | ICD-10-CM

## 2014-02-28 DIAGNOSIS — C189 Malignant neoplasm of colon, unspecified: Secondary | ICD-10-CM

## 2014-02-28 DIAGNOSIS — D62 Acute posthemorrhagic anemia: Secondary | ICD-10-CM

## 2014-02-28 DIAGNOSIS — E119 Type 2 diabetes mellitus without complications: Secondary | ICD-10-CM

## 2014-02-28 DIAGNOSIS — I10 Essential (primary) hypertension: Secondary | ICD-10-CM

## 2014-02-28 DIAGNOSIS — R609 Edema, unspecified: Secondary | ICD-10-CM

## 2014-02-28 NOTE — Assessment & Plan Note (Signed)
-  Resolved after holding Lasix and lisinopril, started back on home dose diuretic. ACE inhibitor on hold

## 2014-02-28 NOTE — Assessment & Plan Note (Signed)
Hemoglobin A1c 7.0 -- 02/12/2014 - good control, esp for age -Continue home oral hypoglycemic agent along with sliding scale Q. a.c. at bedtime.

## 2014-02-28 NOTE — Assessment & Plan Note (Signed)
BP has been labile-is noncompliant with medications. Has been conseled.  On higher than home dose Coreg along with Norvasc which was added

## 2014-02-28 NOTE — Progress Notes (Signed)
MRN: [id number] Name: [name]  Sex: male Age: 80 y.o. DOB: 07-Sep-1933  Hood #: Helene Kelp Facility/Room: 223 Level Of Care: SNF Provider: Inocencio Homes D Emergency Contacts: Extended Emergency Contact Information Primary Emergency Contact: Robards,Tammy Address: [place], [place] [address] [place] Phone: [phone] Relation: Daughter  Code Status:   Allergies: Review of patient's allergies indicates no known allergies.  Chief Complaint  Patient presents with  . nursing home admission    HPI: Patient is 80 y.o. male who is admitted to SNF for rehab for generalized weakness after an acute CI bleed 2/2 with a new dx cecal CA.  Past Medical History  Diagnosis Date  . Smoker   . Narcolepsy   . Hypertension   . BPH (benign prostatic hyperplasia)   . Arthritis   . Diabetes mellitus   . Neuropathy in diabetes   . Herpes   . Dyslipidemia   . Obesity   . Lymphedema     LE lymphedemia with hx of cellulitis.  . CKD (chronic kidney disease)   . Anemia   . History of GI bleed 06/2012    EGD unremarkable;  Colo with large cecal polyp (bx benign)  . Ischemic cardiomyopathy     Echo 06/19/12: EF 40-45%, mid to distal anteroseptal HK, moderate LAE, PASP 37  . Chronic systolic heart failure   . CAD (coronary artery disease)     a. probable underlying CAD;  b. Type 2 NSTEMi in setting of profound anemia (Hgb 3.6) from GI bleed in 06/2012 and WMA on echo;  c. med Rx due to CKD and GI bleed  . CHF (congestive heart failure)   . Atrial fibrillation     Past Surgical History  Procedure Laterality Date  . Leg surgery      Left leg surgery. broken leg  . Esophagogastroduodenoscopy  06/19/2012    Procedure: ESOPHAGOGASTRODUODENOSCOPY (EGD);  Surgeon: Beryle Beams, MD;  Location: [facility] ENDOSCOPY;  Service: Endoscopy;  Laterality: N/A;  . Colonoscopy  06/21/2012    Procedure: COLONOSCOPY;  Surgeon: Juanita Craver, MD;  Location: [facility]  ENDOSCOPY;  Service: Endoscopy;  Laterality: N/A;  . Colonoscopy Left 02/13/2014    Procedure: COLONOSCOPY;  Surgeon: Juanita Craver, MD;  Location: [place];  Service: Endoscopy;  Laterality: Left;  . Laparoscopic right colectomy Right 02/15/2014    Procedure: LAPAROSCOPIC ASISSTED RIGHT  COLECTOMY, POSSIBLE OPEN;  Surgeon: Rolm Bookbinder, MD;  Location: [place];  Service: General;  Laterality: Right;      Medication List       This list is accurate as of: 02/28/14  9:45 PM.  Always use your most recent med list.               amLODipine 10 MG tablet  Commonly known as:  NORVASC  Take 1 tablet (10 mg total) by mouth at bedtime.     carvedilol 12.5 MG tablet  Commonly known as:  COREG  Take 1 tablet (12.5 mg total) by mouth 2 (two) times daily with a meal.     ferrous sulfate 325 (65 FE) MG tablet  Take 1 tablet (325 mg total) by mouth 3 (three) times daily with meals.     furosemide 40 MG tablet  Commonly known as:  LASIX  Take 1 tablet (40 mg total) by mouth daily.     glipiZIDE 2.5 mg Tabs tablet  Commonly known as:  GLUCOTROL  Take  0.5 tablets (2.5 mg total) by mouth daily before breakfast.     insulin aspart 100 UNIT/ML injection  Commonly known as:  novoLOG  Before each meal 3 times a day, 140-199 - 2 units, 200-250 - 4 units, 251-299 - 6 units,  300-349 - 8 units,  350 or above 10 units.     oxyCODONE-acetaminophen 5-325 MG per tablet  Commonly known as:  PERCOCET/ROXICET  Take 1 tablet by mouth every 4 (four) hours as needed for moderate pain.     polyethylene glycol packet  Commonly known as:  MIRALAX / GLYCOLAX  Take 17 g by mouth daily.     potassium chloride 10 MEQ tablet  Commonly known as:  K-DUR  Take 1 tablet (10 mEq total) by mouth daily.        No orders of the defined types were placed in this encounter.    Immunization History  Administered Date(s) Administered  . Influenza Split 06/25/2011  . Influenza, High Dose Seasonal PF 06/07/2013   . Pneumococcal Polysaccharide-23 02/25/2012, 06/20/2012  . Td 01/28/2005    History  Substance Use Topics  . Smoking status: Former Smoker    Quit date: 08/26/1998  . Smokeless tobacco: Never Used  . Alcohol Use: No     Comment: in the past    Review of Systems  DATA OBTAINED: from patient, nurse, medical record, family member GENERAL: Feels well no fevers, fatigue, appetite changes SKIN: No itching, rash HEENT: No complaint RESPIRATORY: No cough, wheezing, SOB CARDIAC: No chest pain, palpitations, lower extremity edema  GI: No abdominal pain, No N/V/D or constipation, No heartburn or reflux  GU: No dysuria, frequency or urgency, or incontinence  MUSCULOSKELETAL: No unrelieved bone/joint pain NEUROLOGIC: No headache, dizziness or focal weakness PSYCHIATRIC: No overt anxiety or sadness. Sleeps well.   Filed Vitals:   02/28/14 1507  BP: 160/82  Pulse: 72  Temp: 97.8 F (36.6 C)  Resp: 18    Physical Exam  GENERAL APPEARANCE: Alert, conversant. Appropriately groomed. No acute distress  SKIN: No diaphoresis rash HEENT: Unremarkable RESPIRATORY: Breathing is even, unlabored. Lung sounds are clear   CARDIOVASCULAR: Heart RRR no murmurs, rubs or gallops. trace peripheral edema  GASTROINTESTINAL: Abdomen is soft, non-tender, not distended w/ normal bowel sounds.  GENITOURINARY: Bladder non tender, not distended  MUSCULOSKELETAL: No abnormal joints or musculature NEUROLOGIC: Cranial nerves 2-12 grossly intact. Moves all extremities no tremor. PSYCHIATRIC: Mood and affect appropriate to situation, no behavioral issues  Patient Active Problem List   Diagnosis Date Noted  . HTN (hypertension) 02/28/2014  . Acute on chronic renal failure 02/19/2014  . Acute blood loss anemia 02/19/2014  . Colon cancer- adenocarcinoma 02/16/2014  . Colon tumor- not yet determined if cancerous  02/13/2014  . Hypertensive urgency 02/12/2014  . Acute GI bleeding 02/12/2014  . Pedal edema  02/12/2014  . Anemia 11/13/2013  . Narcolepsy 11/13/2013  . Noncompliance with medications 11/13/2013  . CKD (chronic kidney disease), stage III 07/21/2013  . Back pain 02/26/2013  . Leukopenia 02/26/2013  . Polyp of colon 06/23/2012  . Chronic acquired lymphedema 06/25/2011  . Type II or unspecified type diabetes mellitus without mention of complication, not stated as uncontrolled 01/01/2011  . Hyperlipemia 01/01/2011  . Obesity 01/01/2011  . Personal history of noncompliance with medical treatment, presenting hazards to health 01/01/2011  . BPH (benign prostatic hyperplasia) 01/01/2011  . Hyperlipidemia 01/01/2011    CBC    Component Value Date/Time   WBC 3.0* 02/23/2014 5732  RBC 2.86* 02/23/2014 1250   RBC 2.83* 02/23/2014 0446   HGB 8.2* 02/24/2014 0338   HCT 25.3* 02/24/2014 0338   PLT 185 02/23/2014 0446   MCV 82.0 02/23/2014 0446   LYMPHSABS 0.8 12/08/2013 1501   MONOABS 0.5 12/08/2013 1501   EOSABS 0.0 12/08/2013 1501   BASOSABS 0.0 12/08/2013 1501    CMP     Component Value Date/Time   NA 136* 02/23/2014 0446   K 4.1 02/23/2014 0446   CL 100 02/23/2014 0446   CO2 24 02/23/2014 0446   GLUCOSE 135* 02/23/2014 0446   BUN 6 02/23/2014 0446   CREATININE 1.26 02/23/2014 0446   CREATININE 1.56* 10/07/2013 1141   CALCIUM 8.1* 02/23/2014 0446   PROT 6.5 02/11/2014 2256   ALBUMIN 3.0* 02/11/2014 2256   AST 8 02/11/2014 2256   ALT <5 02/11/2014 2256   ALKPHOS 57 02/11/2014 2256   BILITOT 0.4 02/17/2014 1815   GFRNONAA 53* 02/23/2014 0446   GFRAA 61* 02/23/2014 0446    Assessment and Plan  Acute blood loss anemia combination of AOCD + GI Bleed and iron deficiency (due to cecal adenocarcinoma) + Dilution  -Patient received 2 units PRBCs 02/11/2014 , Received additional 2 units PRBCs 02/16/2014 , LDH haptoglobin and bilirubin unremarkable.  -Anemia panel shows profound iron deficiency, but IV iron replacement and will be placed on oral iron.  -On it her CBC and iron panel closely in the outpatient  setting   Colon cancer- adenocarcinoma 02/13/2014 colonoscopy (Dr. Dalphine Handing mass, seen by general surgery and underwent right hemicolectomy on 02/15/2014 by Dr. Donne Hazel. No further rectal bleed.  -Pathology = adenocarcinoma, lymph nodes negative .  -Dr. Wynelle Cleveland spoke with Dr. Bary Castilla to follow with her within a week.   CKD (chronic kidney disease), stage III -Resolved after holding Lasix and lisinopril, started back on home dose diuretic. ACE inhibitor on hold   HTN (hypertension) BP has been labile-is noncompliant with medications. Has been conseled.  On higher than home dose Coreg along with Norvasc which was added   Type II or unspecified type diabetes mellitus without mention of complication, not stated as uncontrolled Hemoglobin A1c 7.0 -- 02/12/2014 - good control, esp for age -Continue home oral hypoglycemic agent along with sliding scale Q. a.c. at bedtime.   Pedal edema Lasix and pt IS wearing his TED hose    Hennie Duos, MD

## 2014-02-28 NOTE — Assessment & Plan Note (Signed)
combination of AOCD + GI Bleed and iron deficiency (due to cecal adenocarcinoma) + Dilution  -Patient received 2 units PRBCs 02/11/2014 , Received additional 2 units PRBCs 02/16/2014 , LDH haptoglobin and bilirubin unremarkable.  -Anemia panel shows profound iron deficiency, but IV iron replacement and will be placed on oral iron.  -On it her CBC and iron panel closely in the outpatient setting

## 2014-02-28 NOTE — Assessment & Plan Note (Signed)
Lasix and pt IS wearing his TED hose

## 2014-02-28 NOTE — Assessment & Plan Note (Signed)
02/13/2014 colonoscopy (Dr. Dalphine Handing mass, seen by general surgery and underwent right hemicolectomy on 02/15/2014 by Dr. Donne Hazel. No further rectal bleed.  -Pathology = adenocarcinoma, lymph nodes negative .  -Dr. Wynelle Cleveland spoke with Dr. Bary Castilla to follow with her within a week.

## 2014-03-04 ENCOUNTER — Telehealth: Payer: Self-pay | Admitting: *Deleted

## 2014-03-04 NOTE — Telephone Encounter (Signed)
Spoke with Tammy at [facility], per request of patient's daughter, to confirm appointment with Dr. Benay Spice for 03/10/14.  She stated that the SNF was already aware and would be bring patient to MD visit.

## 2014-03-08 ENCOUNTER — Ambulatory Visit (INDEPENDENT_AMBULATORY_CARE_PROVIDER_SITE_OTHER): Payer: Medicare Other | Admitting: General Surgery

## 2014-03-08 ENCOUNTER — Encounter (INDEPENDENT_AMBULATORY_CARE_PROVIDER_SITE_OTHER): Payer: Self-pay | Admitting: General Surgery

## 2014-03-08 VITALS — BP 130/76 | HR 72 | Temp 97.8°F | Ht 70.0 in | Wt 239.0 lb

## 2014-03-08 DIAGNOSIS — Z09 Encounter for follow-up examination after completed treatment for conditions other than malignant neoplasm: Secondary | ICD-10-CM

## 2014-03-08 NOTE — Progress Notes (Signed)
Subjective:     Patient ID: [name], male   DOB: 08-03-1934, 80 y.o.   MRN: [id number]  HPI 36 yom who was admitted for gi bleed and then underwent lap assist right colon for T2N0 tumor (12 nodes negative).  He had prolonged hospitalization with deconditioning. He is now at [facility].  He has le edema but states he is up and around.  States he is eating fine and having bms.  No real complaints today.  Review of Systems     Objective:   Physical Exam Healing incisions, I removed all staples today, soft nontender    Assessment:     T2N0 colon cancer s/p resection     Plan:     He has no restrictions from my standpoint.  His steristrips will come off in next couple weeks.  He needs follow up with Dr Alvy Bimler as in discharge summary. Not sure if this appointment has been made already.  I can see as needed.

## 2014-03-10 ENCOUNTER — Ambulatory Visit: Payer: Medicare Other

## 2014-03-10 ENCOUNTER — Encounter: Payer: Self-pay | Admitting: Oncology

## 2014-03-10 ENCOUNTER — Ambulatory Visit (HOSPITAL_BASED_OUTPATIENT_CLINIC_OR_DEPARTMENT_OTHER): Payer: Medicare Other | Admitting: Oncology

## 2014-03-10 ENCOUNTER — Ambulatory Visit (HOSPITAL_BASED_OUTPATIENT_CLINIC_OR_DEPARTMENT_OTHER): Payer: Medicare Other

## 2014-03-10 VITALS — BP 199/77 | HR 80 | Temp 98.7°F | Resp 19 | Ht 70.0 in | Wt 241.8 lb

## 2014-03-10 DIAGNOSIS — D509 Iron deficiency anemia, unspecified: Secondary | ICD-10-CM

## 2014-03-10 DIAGNOSIS — R609 Edema, unspecified: Secondary | ICD-10-CM

## 2014-03-10 DIAGNOSIS — D696 Thrombocytopenia, unspecified: Secondary | ICD-10-CM

## 2014-03-10 DIAGNOSIS — C189 Malignant neoplasm of colon, unspecified: Secondary | ICD-10-CM

## 2014-03-10 DIAGNOSIS — C180 Malignant neoplasm of cecum: Secondary | ICD-10-CM

## 2014-03-10 LAB — CBC WITH DIFFERENTIAL/PLATELET
BASO%: 0.3 % (ref 0.0–2.0)
BASOS ABS: 0 10*3/uL (ref 0.0–0.1)
EOS%: 1.4 % (ref 0.0–7.0)
Eosinophils Absolute: 0.1 10*3/uL (ref 0.0–0.5)
HEMATOCRIT: 27.6 % — AB (ref 38.4–49.9)
HGB: 8.7 g/dL — ABNORMAL LOW (ref 13.0–17.1)
LYMPH#: 0.7 10*3/uL — AB (ref 0.9–3.3)
LYMPH%: 19.8 % (ref 14.0–49.0)
MCH: 26.1 pg — AB (ref 27.2–33.4)
MCHC: 31.5 g/dL — ABNORMAL LOW (ref 32.0–36.0)
MCV: 82.9 fL (ref 79.3–98.0)
MONO#: 1 10*3/uL — AB (ref 0.1–0.9)
MONO%: 29.1 % — ABNORMAL HIGH (ref 0.0–14.0)
NEUT#: 1.8 10*3/uL (ref 1.5–6.5)
NEUT%: 49.4 % (ref 39.0–75.0)
Platelets: 103 10*3/uL — ABNORMAL LOW (ref 140–400)
RBC: 3.33 10*6/uL — ABNORMAL LOW (ref 4.20–5.82)
RDW: 18.4 % — AB (ref 11.0–14.6)
WBC: 3.5 10*3/uL — ABNORMAL LOW (ref 4.0–10.3)
nRBC: 0 % (ref 0–0)

## 2014-03-10 LAB — TECHNOLOGIST REVIEW

## 2014-03-10 NOTE — Progress Notes (Signed)
[name] Patient Consult   Referring MD: Calyb Mcquarrie 80 y.o.  May 20, 1934    Reason for Referral: Colon cancer   HPI: [name] has multiple medical conditions. He discontinued his medication for several months. He presented to the emergency room 02/12/2014 with shortness of breath and chest pain. He was found to have a hemoglobin of 6.4. He reported rectal bleeding. He was admitted for further evaluation. He was transfused with packed blood cells.  Dr. Collene Mares was consulted and he was taken to a colonoscopy 02/13/2014. A polyp was found in the rectosigmoid colon and was removed. A large villous polyp was noted at the cecum. Biopsies were obtained. A mid ascending colon polyp was not removed. The rectosigmoid polyp returned as a hyperplastic polyp. The biopsy from the cecum revealed adenocarcinoma.  Surgery was consult at and he was taken the operating room by Dr. Donne Hazel 02/15/2014. A mass was palpated in the cecum. The terminal ileum and transverse colon were divided and a right colectomy was performed. The pathology [id number]) confirmed an adenocarcinoma of the cecum. Tumor extended into the muscularis propria. No lymphovascular or perineural invasion. The resection margins were negative. 5 additional tubular adenomas. 12 lymph nodes were negative for metastatic carcinoma.  He developed a postoperative ileus. He developed postoperative renal failure that improves prior to discharge. He was discharged to a skilled nursing facility 02/25/2014.  [name] reports leg swelling for years. No specific complaint today.       Past Medical History  Diagnosis Date  . Smoker   . Narcolepsy   . Hypertension   . BPH (benign prostatic hyperplasia)   . Arthritis   . Diabetes mellitus   . Neuropathy in diabetes   . Herpes   . Dyslipidemia   . Obesity   . Lymphedema     LE lymphedemia with hx of cellulitis.  . CKD (chronic kidney  disease)   . Anemia-iron deficiency   June 2015   . History of GI bleed 06/2012    EGD unremarkable;  Colo with large cecal polyp (bx benign)  . Ischemic cardiomyopathy     Echo 06/19/12: EF 40-45%, mid to distal anteroseptal HK, moderate LAE, PASP 37  . Chronic systolic heart failure   . CAD (coronary artery disease)     a. probable underlying CAD;  b. Type 2 NSTEMi in setting of profound anemia (Hgb 3.6) from GI bleed in 06/2012 and WMA on echo;  c. med Rx due to CKD and GI bleed  . CHF (congestive heart failure)   . Atrial fibrillation     .  Colon cancer-cecum (T2 N0)                                                                           02/15/2014  Past Surgical History  Procedure Laterality Date  . Leg surgery      Left leg surgery. broken leg  . Esophagogastroduodenoscopy  06/19/2012    Procedure: ESOPHAGOGASTRODUODENOSCOPY (EGD);  Surgeon: Beryle Beams, MD;  Location: [facility] ENDOSCOPY;  Service: Endoscopy;  Laterality: N/A;  . Colonoscopy  06/21/2012    Procedure: COLONOSCOPY;  Surgeon: Juanita Craver, MD;  Location: [place];  Service: Endoscopy;  Laterality: N/A;  . Colonoscopy Left 02/13/2014    Procedure: COLONOSCOPY;  Surgeon: Juanita Craver, MD;  Location: [facility];  Service: Endoscopy;  Laterality: Left;  . Laparoscopic right colectomy Right 02/15/2014    Procedure: LAPAROSCOPIC ASISSTED RIGHT  COLECTOMY, POSSIBLE OPEN;  Surgeon: Rolm Bookbinder, MD;  Location: [facility] OR;  Service: General;  Laterality: Right;    Medications: Reviewed  Allergies: No Known Allergies  Family history: 5 siblings. One daughter. No family history of cancer.  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 He currently resides at the [facility]. He quit smoking cigarettes in August of 2000. He quit alcohol in the 1980s. No risk factor for HIV or hepatitis.    ROS:   Positives include: Chronic leg edema, anorexia, bright red blood per rectum  prior to hospital admission in June 2015  A complete ROS was otherwise negative.  Physical Exam:  Blood pressure 199/77, pulse 80, temperature 98.7 F (37.1 C), temperature source Oral, resp. rate 19, height 5\' 10"  (1.778 m), weight 241 lb 12.8 oz (109.68 kg).  HEENT: Edentulous, upper and lower denture plate, oropharynx without visible mass, neck without mass Lungs: Clear bilaterally Cardiac: Regular rate and rhythm Abdomen: No hepatosplenomegaly, no mass. Healing surgical incisions GU: Testes without mass, uncircumcised  Vascular: 3+ edema below the knee bilaterally Lymph nodes: No cervical, supra-clavicular, axillary, or inguinal nodes Neurologic: Alert and oriented, he stutters, the motor exam appears intact in the upper and lower extremities Skin: Multiple benign appearing moles over the trunk, yeast rash in the groin bilaterally Musculoskeletal: No spine   LAB:  CBC  Lab Results  Component Value Date   WBC 3.5* 03/10/2014   HGB 8.7* 03/10/2014   HCT 27.6* 03/10/2014   MCV 82.9 03/10/2014   PLT 103 Large platelets present* 03/10/2014   NEUTROABS 1.8 03/10/2014     CMP      Component Value Date/Time   NA 136* 02/23/2014 0446   K 4.1 02/23/2014 0446   CL 100 02/23/2014 0446   CO2 24 02/23/2014 0446   GLUCOSE 135* 02/23/2014 0446   BUN 6 02/23/2014 0446   CREATININE 1.26 02/23/2014 0446   CREATININE 1.56* 10/07/2013 1141   CALCIUM 8.1* 02/23/2014 0446   PROT 6.5 02/11/2014 2256   ALBUMIN 3.0* 02/11/2014 2256   AST 8 02/11/2014 2256   ALT <5 02/11/2014 2256   ALKPHOS 57 02/11/2014 2256   BILITOT 0.4 02/17/2014 1815   GFRNONAA 53* 02/23/2014 0446   GFRAA 61* 02/23/2014 0446    Lab Results  Component Value Date   CEA <0.5 02/14/2014    Imaging: Chest x-ray 02/11/2014-cardiomegaly, mild central perihilar pulmonary vascular congestion no focal infiltrate     Assessment/Plan:   1. Adenocarcinoma of the cecum, stage I (T2 N0), status post a right colectomy 02/15/2014 2. Iron  deficiency anemia secondary to #1-improved 3. Mild Thrombocytopenia on a CBC 03/11/2014-etiology unclear 4. History of congestive heart failure 5. Chronic renal failure 6. Diabetes 7. Chronic leg edema 8. Narcolepsy 9. Multiple colon polyps on the resection specimen 02/15/2014   Disposition:   [name] has been diagnosed with colon cancer. He underwent a right colectomy 02/15/2014 and was found to have a stage I tumor. There is no indication for adjuvant systemic chemotherapy. He has a good prognosis for a long-term disease-free survival. He should followup with Dr. Collene Mares for a surveillance colonoscopy in approximately one year.  The iron deficiency  anemia has improved. I recommend he continue ferrous sulfate.  The platelet count is mildly [name] on a CBC today. Review of the electronic record reveals intermittent thrombocytopenia dating back for several years. He may have chronic ITP or liver disease. Thrombocytopenia can uncommonly be seen with iron deficiency. We will recommend a repeat CBC in 4-6 weeks via his physician at [facility]. We will be glad to see him back if he has progressive thrombocytopenia.  [name] is not scheduled for a followup appointment at the [facility]. We will be glad to see him in the future as needed. Joy, [place] 03/10/2014, 6:07 PM

## 2014-03-10 NOTE — Progress Notes (Signed)
Checked in new pt with no financial concerns. °

## 2014-03-11 ENCOUNTER — Telehealth: Payer: Self-pay | Admitting: *Deleted

## 2014-03-11 NOTE — Telephone Encounter (Signed)
Faxed CBC results with MD note attached to [facility]. Had also provided patient and NT copy at appointment this week.

## 2014-03-11 NOTE — Telephone Encounter (Signed)
Message copied by Tania Ade on Fri Mar 11, 2014  2:54 PM ------      Message from: Betsy Coder B      Created: Thu Mar 10, 2014  6:43 PM       Since CBC to M.D. at [facility] with recommendation for a repeat CBC in 4-6 weeks, copy to [place]. Ask them to call us for progressive thrombocytopenia and we will see him again. ------

## 2014-03-18 ENCOUNTER — Non-Acute Institutional Stay (SKILLED_NURSING_FACILITY): Payer: Medicare Other | Admitting: Nurse Practitioner

## 2014-03-18 ENCOUNTER — Encounter: Payer: Self-pay | Admitting: Nurse Practitioner

## 2014-03-18 DIAGNOSIS — C189 Malignant neoplasm of colon, unspecified: Secondary | ICD-10-CM

## 2014-03-18 DIAGNOSIS — R609 Edema, unspecified: Secondary | ICD-10-CM

## 2014-03-18 DIAGNOSIS — N183 Chronic kidney disease, stage 3 unspecified: Secondary | ICD-10-CM

## 2014-03-18 DIAGNOSIS — E119 Type 2 diabetes mellitus without complications: Secondary | ICD-10-CM

## 2014-03-18 DIAGNOSIS — I5032 Chronic diastolic (congestive) heart failure: Secondary | ICD-10-CM

## 2014-03-18 DIAGNOSIS — R600 Localized edema: Secondary | ICD-10-CM

## 2014-03-18 DIAGNOSIS — I10 Essential (primary) hypertension: Secondary | ICD-10-CM

## 2014-03-18 DIAGNOSIS — I509 Heart failure, unspecified: Secondary | ICD-10-CM

## 2014-03-18 DIAGNOSIS — D500 Iron deficiency anemia secondary to blood loss (chronic): Secondary | ICD-10-CM

## 2014-03-18 NOTE — Progress Notes (Signed)
Patient ID: [name], male   DOB: [birth year], 80 y.o.   MRN: [id number]  Location:  [facility] Provider:  Hassell Done ,NP  Code Status:  Full  Chief Complaint  Patient presents with  . Discharge Note    HPI:  [name] is a 80 y.o. male with h/o DM, HTN, ischemic cardiomyopathy, Narcolepsy, obesity went to the hospital with dizziness, CP, sob and hematochezia with Hgb of 6.4. Dr. Collene Mares was consulted and colonoscopy revealed a cecal mass. General surgery was consulted and R-hemicolectomy was performed on 02/15/14. Pathology=adenocarcinoma now following with oncology. Pt was transfused 4 units PRBCs total in hospital and cont on iron. Pt now at [facility] for rehab. Patient currently doing well with therapy, now stable to discharge home with home health. Review of Systems:  Review of Systems  Constitutional: Negative for fever, chills and malaise/fatigue.       Pt reports he is doing fine nothing is wrong with him.   HENT: Negative for nosebleeds.   Respiratory: Negative for cough and shortness of breath.   Cardiovascular: Positive for leg swelling. Negative for chest pain.  Gastrointestinal: Negative for heartburn, abdominal pain, diarrhea, constipation and blood in stool.  Genitourinary: Negative for dysuria, urgency, frequency and hematuria.  Musculoskeletal: Negative for myalgias and neck pain.  Skin: Negative for itching and rash.  Neurological: Negative for dizziness, tingling and weakness.  Psychiatric/Behavioral: Negative for depression.    Medications: Patient's Medications  New Prescriptions   No medications on file  Previous Medications   AMLODIPINE (NORVASC) 10 MG TABLET    Take 1 tablet (10 mg total) by mouth at bedtime.   BISACODYL (DULCOLAX) 10 MG SUPPOSITORY    Place 10 mg rectally as needed for moderate constipation (if no results from MOM).   CARVEDILOL (COREG) 12.5 MG TABLET    Take 1 tablet (12.5 mg total) by mouth 2 (two) times daily with a meal.   FERROUS SULFATE 325 (65 FE) MG TABLET    Take 1 tablet (325 mg total) by mouth 3 (three) times daily with meals.   FUROSEMIDE (LASIX) 40 MG TABLET    Take 1 tablet (40 mg total) by mouth daily.   GLIPIZIDE (GLUCOTROL) 5 MG TABLET    Take 2.5 mg by mouth daily before breakfast. For DM   INSULIN ASPART (NOVOLOG) 100 UNIT/ML INJECTION    Before each meal 3 times a day, 140-199 - 2 units, 200-250 - 4 units, 251-299 - 6 units,  300-349 - 8 units,  350 or above 10 units.   MAGNESIUM HYDROXIDE (MILK OF MAGNESIA PO)    Take 30 mLs by mouth as needed. If no BM in 3 days   OXYCODONE-ACETAMINOPHEN (PERCOCET/ROXICET) 5-325 MG PER TABLET    Take 1 tablet by mouth every 4 (four) hours as needed for moderate pain.   POLYETHYLENE GLYCOL (MIRALAX / GLYCOLAX) PACKET    Take 17 g by mouth daily.   POTASSIUM CHLORIDE (K-DUR) 10 MEQ TABLET    Take 1 tablet (10 mEq total) by mouth daily.   SODIUM PHOSPHATES (RA SALINE ENEMA RE)    Place 1 Container rectally as needed (if no BM from MOM, suppository).  Modified Medications   No medications on file  Discontinued Medications   No medications on file    Physical Exam: Filed Vitals:   03/18/14 1155  BP: 138/76  Pulse: 74  Temp: 98.1 F (36.7 C)  Resp: 16   Physical Exam  Constitutional: He is oriented to person, place, and  time and well-developed, well-nourished, and in no distress.  HENT:  Mouth/Throat: Oropharynx is clear and moist. No oropharyngeal exudate.  Eyes: Conjunctivae and EOM are normal. Pupils are equal, round, and reactive to light.  Neck: Normal range of motion. Neck supple.  Cardiovascular: Normal rate, regular rhythm and normal heart sounds.   Pulmonary/Chest: Effort normal and breath sounds normal.  Abdominal: Soft. Bowel sounds are normal.  Musculoskeletal: He exhibits edema. He exhibits no tenderness.  Neurological: He is alert and oriented to person, place, and time.  Skin: Skin is warm and dry.  Psychiatric: He is agitated.      Labs reviewed: Basic Metabolic Panel:  Recent Labs  02/12/14 0956  02/21/14 0400 02/22/14 0710 02/23/14 0446  NA  --   < > 138 141 136*  K  --   < > 4.0 4.1 4.1  CL  --   < > 102 105 100  CO2  --   < > 25 27 24   GLUCOSE  --   < > 132* 124* 135*  BUN  --   < > 13 8 6   CREATININE  --   < > 1.17 1.19 1.26  CALCIUM  --   < > 8.0* 8.1* 8.1*  MG 1.6  --   --   --   --   < > = values in this interval not displayed.  Liver Function Tests:  Recent Labs  11/12/13 1455 12/08/13 1501 02/11/14 2256 02/17/14 1815  AST 9 8 8   --   ALT 8 6 <5  --   ALKPHOS 74 58 57  --   BILITOT 0.3 0.4 <0.2* 0.4  PROT 6.8 7.1 6.5  --   ALBUMIN 3.3* 3.6 3.0*  --     CBC:  Recent Labs  11/12/13 1455 12/08/13 1501  02/22/14 0710 02/23/14 0446 02/24/14 0338 03/10/14 1550  WBC 3.6* 3.0*  < > 3.0* 3.0*  --  3.5*  NEUTROABS 2.1 1.7  --   --   --   --  1.8  HGB 8.7* 9.9*  < > 8.1* 7.4* 8.2* 8.7*  HCT 27.1* 29.6*  < > 24.9* 23.2* 25.3* 27.6*  MCV 84.4 83.4  < > 83.3 82.0  --  82.9  PLT 235 113*  < > 195 185  --  103 Large platelets present*  < > = values in this interval not displayed.   Assessment/Plan   1. Type II or unspecified type diabetes mellitus without mention of complication, not stated as uncontrolled -cont home glipizide, will Dc SSI as he is not requiring this often  2. Colon cancer- adenocarcinoma -underwent right hemicolectomy on 02/15/2014 by Dr. Donne Hazel. No further rectal bleed. Pathology = adenocarcinoma, lymph nodes negative .  -conts to follow with oncology  3. Essential hypertension Stable, conts norvasc, coreg, lasix and potassium   4. CKD (chronic kidney disease), stage III -Cr at baseline  5. Iron deficiency anemia due to chronic blood loss -cont iron, oncology following cbc  6. Pedal edema -conts teds  7. Chronic diastolic CHF with EF 57%  -Compensated, on Coreg, lasix and potassium supplements    pt is stable for discharge-will need  PT/OT/SW/nursing (history of non-compliance) per home health. No DME needed. Rx written.  will need to follow up with PCP within 2 weeks.

## 2014-03-30 ENCOUNTER — Telehealth: Payer: Self-pay | Admitting: *Deleted

## 2014-03-30 DIAGNOSIS — D696 Thrombocytopenia, unspecified: Secondary | ICD-10-CM

## 2014-03-30 NOTE — Telephone Encounter (Signed)
Call from [facility] with [facility], pt was discharged home today. Requesting to schedule follow up appt. He is not seeing a PCP. Called pt, he confirms he does not have a PCP. Requesting to schedule lab in this office.

## 2014-03-31 ENCOUNTER — Other Ambulatory Visit: Payer: Self-pay | Admitting: *Deleted

## 2014-04-02 ENCOUNTER — Telehealth: Payer: Self-pay | Admitting: Oncology

## 2014-04-02 NOTE — Telephone Encounter (Signed)
Labs added per 08/05 POF,  mailed out AVS to pt.Marland Kitchen..KJ

## 2014-04-11 ENCOUNTER — Other Ambulatory Visit: Payer: Self-pay

## 2014-04-27 ENCOUNTER — Ambulatory Visit (INDEPENDENT_AMBULATORY_CARE_PROVIDER_SITE_OTHER): Payer: Medicare Other | Admitting: Podiatry

## 2014-04-27 DIAGNOSIS — B351 Tinea unguium: Secondary | ICD-10-CM

## 2014-04-27 DIAGNOSIS — M79609 Pain in unspecified limb: Secondary | ICD-10-CM

## 2014-04-27 DIAGNOSIS — M79676 Pain in unspecified toe(s): Secondary | ICD-10-CM

## 2014-04-28 NOTE — Progress Notes (Signed)
Patient ID: [name], male   DOB: [birth year], 80 y.o.   MRN: [id number]  Subjective: This patient presents complaining of painful toenails  Objective: Elongated, discolored, hypertrophic toenails 6-10  Assessment: Symptomatic onychomycoses 6-10  Plan: Debridement toenails x10 without a bleeding  Reappoint x3 months

## 2014-05-21 ENCOUNTER — Emergency Department (HOSPITAL_COMMUNITY)
Admission: EM | Admit: 2014-05-21 | Discharge: 2014-05-21 | Disposition: A | Discharge status: Home / Self Care | Payer: Medicare Other | Attending: Emergency Medicine | Admitting: Emergency Medicine

## 2014-05-21 ENCOUNTER — Emergency Department (HOSPITAL_COMMUNITY): Payer: Medicare Other

## 2014-05-21 ENCOUNTER — Encounter (HOSPITAL_COMMUNITY): Payer: Self-pay | Admitting: Emergency Medicine

## 2014-05-21 DIAGNOSIS — R05 Cough: Secondary | ICD-10-CM | POA: Diagnosis present

## 2014-05-21 DIAGNOSIS — Z8619 Personal history of other infectious and parasitic diseases: Secondary | ICD-10-CM | POA: Diagnosis not present

## 2014-05-21 DIAGNOSIS — Z87891 Personal history of nicotine dependence: Secondary | ICD-10-CM | POA: Insufficient documentation

## 2014-05-21 DIAGNOSIS — E669 Obesity, unspecified: Secondary | ICD-10-CM | POA: Insufficient documentation

## 2014-05-21 DIAGNOSIS — I10 Essential (primary) hypertension: Secondary | ICD-10-CM

## 2014-05-21 DIAGNOSIS — E1149 Type 2 diabetes mellitus with other diabetic neurological complication: Secondary | ICD-10-CM | POA: Diagnosis not present

## 2014-05-21 DIAGNOSIS — I2510 Atherosclerotic heart disease of native coronary artery without angina pectoris: Secondary | ICD-10-CM | POA: Diagnosis not present

## 2014-05-21 DIAGNOSIS — R059 Cough, unspecified: Secondary | ICD-10-CM | POA: Insufficient documentation

## 2014-05-21 DIAGNOSIS — Z862 Personal history of diseases of the blood and blood-forming organs and certain disorders involving the immune mechanism: Secondary | ICD-10-CM | POA: Insufficient documentation

## 2014-05-21 DIAGNOSIS — J069 Acute upper respiratory infection, unspecified: Secondary | ICD-10-CM | POA: Diagnosis not present

## 2014-05-21 DIAGNOSIS — Z8739 Personal history of other diseases of the musculoskeletal system and connective tissue: Secondary | ICD-10-CM | POA: Insufficient documentation

## 2014-05-21 DIAGNOSIS — Z872 Personal history of diseases of the skin and subcutaneous tissue: Secondary | ICD-10-CM | POA: Diagnosis not present

## 2014-05-21 DIAGNOSIS — E1142 Type 2 diabetes mellitus with diabetic polyneuropathy: Secondary | ICD-10-CM | POA: Diagnosis not present

## 2014-05-21 DIAGNOSIS — I129 Hypertensive chronic kidney disease with stage 1 through stage 4 chronic kidney disease, or unspecified chronic kidney disease: Secondary | ICD-10-CM | POA: Insufficient documentation

## 2014-05-21 DIAGNOSIS — N189 Chronic kidney disease, unspecified: Secondary | ICD-10-CM | POA: Insufficient documentation

## 2014-05-21 DIAGNOSIS — Z79899 Other long term (current) drug therapy: Secondary | ICD-10-CM | POA: Insufficient documentation

## 2014-05-21 LAB — I-STAT TROPONIN, ED: Troponin i, poc: 0.03 ng/mL (ref 0.00–0.08)

## 2014-05-21 LAB — COMPREHENSIVE METABOLIC PANEL
ALK PHOS: 71 U/L (ref 39–117)
ALT: 5 U/L (ref 0–53)
AST: 11 U/L (ref 0–37)
Albumin: 3.6 g/dL (ref 3.5–5.2)
Anion gap: 12 (ref 5–15)
BUN: 17 mg/dL (ref 6–23)
CHLORIDE: 102 meq/L (ref 96–112)
CO2: 26 mEq/L (ref 19–32)
Calcium: 8.6 mg/dL (ref 8.4–10.5)
Creatinine, Ser: 1.41 mg/dL — ABNORMAL HIGH (ref 0.50–1.35)
GFR calc Af Amer: 54 mL/min — ABNORMAL LOW (ref >90)
GFR calc non Af Amer: 46 mL/min — ABNORMAL LOW (ref >90)
Glucose, Bld: 140 mg/dL — ABNORMAL HIGH (ref 70–99)
POTASSIUM: 3.9 meq/L (ref 3.7–5.3)
Sodium: 140 mEq/L (ref 137–147)
Total Bilirubin: 0.5 mg/dL (ref 0.3–1.2)
Total Protein: 7.3 g/dL (ref 6.0–8.3)

## 2014-05-21 LAB — CBC WITH DIFFERENTIAL/PLATELET
BASOS ABS: 0 10*3/uL (ref 0.0–0.1)
Basophils Relative: 0 % (ref 0–1)
Eosinophils Absolute: 0 10*3/uL (ref 0.0–0.7)
Eosinophils Relative: 1 % (ref 0–5)
HEMATOCRIT: 29.1 % — AB (ref 39.0–52.0)
Hemoglobin: 9.4 g/dL — ABNORMAL LOW (ref 13.0–17.0)
LYMPHS ABS: 0.8 10*3/uL (ref 0.7–4.0)
LYMPHS PCT: 20 % (ref 12–46)
MCH: 26.2 pg (ref 26.0–34.0)
MCHC: 32.3 g/dL (ref 30.0–36.0)
MCV: 81.1 fL (ref 78.0–100.0)
Monocytes Absolute: 1.4 10*3/uL — ABNORMAL HIGH (ref 0.1–1.0)
Monocytes Relative: 33 % — ABNORMAL HIGH (ref 3–12)
NEUTROS ABS: 2 10*3/uL (ref 1.7–7.7)
Neutrophils Relative %: 46 % (ref 43–77)
PLATELETS: 151 10*3/uL (ref 150–400)
RBC: 3.59 MIL/uL — AB (ref 4.22–5.81)
RDW: 15.4 % (ref 11.5–15.5)
WBC: 4.2 10*3/uL (ref 4.0–10.5)

## 2014-05-21 MED ORDER — FUROSEMIDE 20 MG PO TABS
40.0000 mg | ORAL_TABLET | Freq: Once | ORAL | Status: AC
  Administered 2014-05-21: 40 mg via ORAL
  Filled 2014-05-21: qty 2

## 2014-05-21 NOTE — ED Notes (Signed)
Reported to [place], PA-C, that patient's BP has been 503U-882 systolic, and he says "it has to be high for me, it's normal".  She acknowledges, no new orders received.

## 2014-05-21 NOTE — Discharge Instructions (Signed)
Cough, Adult  A cough is a reflex that helps clear your throat and airways. It can help heal the body or may be a reaction to an irritated airway. A cough may only last 2 or 3 weeks (acute) or may last more than 8 weeks (chronic).  CAUSES Acute cough:  Viral or bacterial infections. Chronic cough:  Infections.  Allergies.  Asthma.  Post-nasal drip.  Smoking.  Heartburn or acid reflux.  Some medicines.  Chronic lung problems (COPD).  Cancer. SYMPTOMS   Cough.  Fever.  Chest pain.  Increased breathing rate.  High-pitched whistling sound when breathing (wheezing).  Colored mucus that you cough up (sputum). TREATMENT   A bacterial cough may be treated with antibiotic medicine.  A viral cough must run its course and will not respond to antibiotics.  Your caregiver may recommend other treatments if you have a chronic cough. HOME CARE INSTRUCTIONS   Only take over-the-counter or prescription medicines for pain, discomfort, or fever as directed by your caregiver. Use cough suppressants only as directed by your caregiver.  Use a cold steam vaporizer or humidifier in your bedroom or home to help loosen secretions.  Sleep in a semi-upright position if your cough is worse at night.  Rest as needed.  Stop smoking if you smoke. SEEK IMMEDIATE MEDICAL CARE IF:   You have pus in your sputum.  Your cough starts to worsen.  You cannot control your cough with suppressants and are losing sleep.  You begin coughing up blood.  You have difficulty breathing.  You develop pain which is getting worse or is uncontrolled with medicine.  You have a fever. MAKE SURE YOU:   Understand these instructions.  Will watch your condition.  Will get help right away if you are not doing well or get worse. Document Released: 02/08/2011 Document Revised: 11/04/2011 Document Reviewed: 02/08/2011 [employer] Patient Information 2015 [employer]. This information is not intended  to replace advice given to you by your health care provider. Make sure you discuss any questions you have with your health care provider.  

## 2014-05-21 NOTE — ED Provider Notes (Signed)
Medical screening examination/treatment/procedure(s) were conducted as a shared visit with non-physician practitioner(s) and myself.  I personally evaluated the patient during the encounter.   EKG Interpretation   Date/Time:  Saturday May 21 2014 07:57:06 EDT Ventricular Rate:  69 PR Interval:  227 QRS Duration: 85 QT Interval:  456 QTC Calculation: 489 R Axis:   -10 Text Interpretation:  Sinus rhythm Sinus pause Prolonged PR interval  Probable anteroseptal infarct, recent Lateral leads are also involved No  significant change since last tracing Confirmed by Devi Hopman  MD, Jamesina Gaugh ([id number])  on 05/21/2014 9:12:51 AM      [name] is a 80 y.o. male hx of HTN, DM here with cough. Nonproductive cough for several weeks. No fever or chest pain or shortness of breath. Patient poor historian. States power went out last night and he was more concerned. Vitals showed hypertension (per patient chronic) not hypoxic. Lungs clear. Heart nl. Abdomen soft and nontender. Cr slightly elevated, CXR clear. He didn't take lasix this AM so was given lasix and BP improved. Refused BP meds as it has dropped him too low in the past. No signs of hypertensive emergency. Stable for d/c.    Wandra Arthurs, MD 05/21/14 [phone]

## 2014-05-21 NOTE — ED Provider Notes (Signed)
CSN: [id number]     Arrival date & time 05/21/14  0601 History   First MD Initiated Contact with Patient 05/21/14 0700     Chief Complaint  Patient presents with  . Cough     (Consider location/radiation/quality/duration/timing/severity/associated sxs/prior Treatment) Patient is a 80 y.o. male presenting with cough. The history is provided by the patient. No language interpreter was used.  Cough Cough characteristics:  Non-productive Severity:  Mild Onset quality:  Gradual Timing:  Constant Progression:  Worsening Chronicity:  New Smoker: no   Context: not upper respiratory infection   Relieved by:  Nothing Worsened by:  Nothing tried Ineffective treatments:  None tried Associated symptoms: no fever, no shortness of breath, no sinus congestion and no sore throat   Risk factors: no recent infection     Past Medical History  Diagnosis Date  . Smoker   . Narcolepsy   . Hypertension   . BPH (benign prostatic hyperplasia)   . Arthritis   . Diabetes mellitus   . Neuropathy in diabetes   . Herpes   . Dyslipidemia   . Obesity   . Lymphedema     LE lymphedemia with hx of cellulitis.  . CKD (chronic kidney disease)   . Anemia   . History of GI bleed 06/2012    EGD unremarkable;  Colo with large cecal polyp (bx benign)  . Ischemic cardiomyopathy     Echo 06/19/12: EF 40-45%, mid to distal anteroseptal HK, moderate LAE, PASP 37  . Chronic systolic heart failure   . CAD (coronary artery disease)     a. probable underlying CAD;  b. Type 2 NSTEMi in setting of profound anemia (Hgb 3.6) from GI bleed in 06/2012 and WMA on echo;  c. med Rx due to CKD and GI bleed  . CHF (congestive heart failure)   . Atrial fibrillation    Past Surgical History  Procedure Laterality Date  . Leg surgery      Left leg surgery. broken leg  . Esophagogastroduodenoscopy  06/19/2012    Procedure: ESOPHAGOGASTRODUODENOSCOPY (EGD);  Surgeon: Beryle Beams, MD;  Location[facility] ENDOSCOPY;  Service:  Endoscopy;  Laterality: N/A;  . Colonoscopy  06/21/2012    Procedure: COLONOSCOPY;  Surgeon: Juanita Craver, MD;  Location: [facility] ENDOSCOPY;  Service: Endoscopy;  Laterality: N/A;  . Colonoscopy Left 02/13/2014    Procedure: COLONOSCOPY;  Surgeon: Juanita Craver, MD;  Location: [facility];  Service: Endoscopy;  Laterality: Left;  . Laparoscopic right colectomy Right 02/15/2014    Procedure: LAPAROSCOPIC ASISSTED RIGHT  COLECTOMY, POSSIBLE OPEN;  Surgeon: Rolm Bookbinder, MD;  Location: [facility];  Service: General;  Laterality: Right;   Family History  Problem Relation Age of Onset  . Brain cancer Mother   . Diabetes type II Sister   . Narcolepsy Paternal Uncle    History  Substance Use Topics  . Smoking status: Former Smoker    Quit date: 08/26/1998  . Smokeless tobacco: Never Used  . Alcohol Use: No     Comment: in the past    Review of Systems  Constitutional: Negative for fever.  HENT: Negative for sore throat.   Respiratory: Positive for cough. Negative for shortness of breath.   All other systems reviewed and are negative.     Allergies  Review of patient's allergies indicates no known allergies.  Home Medications   Prior to Admission medications   Medication Sig Start Date End Date Taking? Authorizing Provider  bisacodyl (DULCOLAX) 10 MG suppository Place 10  mg rectally as needed for moderate constipation (if no results from MOM).   Yes Historical Provider, MD  furosemide (LASIX) 40 MG tablet Take 1 tablet (40 mg total) by mouth daily. 01/10/14  Yes Denita Lung, MD  glipiZIDE (GLUCOTROL) 5 MG tablet Take 2.5 mg by mouth daily before breakfast. For DM   Yes Historical Provider, MD  Magnesium Hydroxide (MILK OF MAGNESIA PO) Take 30 mLs by mouth as needed. If no BM in 3 days   Yes Historical Provider, MD   BP 201/67  Pulse 72  Temp(Src) 98.8 F (37.1 C) (Oral)  Resp 19  Ht 5\' 10"  (1.778 m)  Wt 230 lb (104.327 kg)  BMI 33.00 kg/m2  SpO2 95% Physical Exam  Nursing note  and vitals reviewed. Constitutional: He appears well-developed and well-nourished.  HENT:  Head: Normocephalic.  Right Ear: External ear normal.  Left Ear: External ear normal.  Nose: Nose normal.  Mouth/Throat: Oropharynx is clear and moist.  Eyes: Conjunctivae and EOM are normal. Pupils are equal, round, and reactive to light.  Neck: Normal range of motion. Neck supple.  Cardiovascular: Normal rate and normal heart sounds.   Pulmonary/Chest: Effort normal and breath sounds normal.  Abdominal: Soft.  Musculoskeletal: Normal range of motion.  Neurological: He is alert.  Skin: Skin is warm.  Psychiatric: He has a normal mood and affect.    ED Course  Procedures (including critical care time) Labs Review Labs Reviewed - No data to display  Imaging Review No results found.   EKG Interpretation None      MDM   Final diagnoses:  Essential hypertension  Acute URI   Pt advised no pneumonia.  Pt advised to take robitussion otc.     [place], PA-C 05/21/14 [phone]

## 2014-05-21 NOTE — ED Notes (Signed)
Patient reports he had colon cancer "cut out" 2 months ago. He came to the hospital today because his power went out, and he feels like he has a cold.  Cough is nonproductive, and he says he previously had a fever.

## 2014-06-12 ENCOUNTER — Encounter (HOSPITAL_COMMUNITY): Payer: Self-pay | Admitting: Emergency Medicine

## 2014-06-12 ENCOUNTER — Encounter: Payer: Self-pay | Admitting: Internal Medicine

## 2014-06-12 ENCOUNTER — Emergency Department (HOSPITAL_COMMUNITY)
Admission: EM | Admit: 2014-06-12 | Discharge: 2014-06-12 | Disposition: A | Discharge status: Home / Self Care | Payer: Medicare Other | Attending: Emergency Medicine | Admitting: Emergency Medicine

## 2014-06-12 DIAGNOSIS — R531 Weakness: Secondary | ICD-10-CM | POA: Diagnosis present

## 2014-06-12 DIAGNOSIS — N189 Chronic kidney disease, unspecified: Secondary | ICD-10-CM | POA: Insufficient documentation

## 2014-06-12 DIAGNOSIS — Z87891 Personal history of nicotine dependence: Secondary | ICD-10-CM | POA: Diagnosis not present

## 2014-06-12 DIAGNOSIS — Z862 Personal history of diseases of the blood and blood-forming organs and certain disorders involving the immune mechanism: Secondary | ICD-10-CM | POA: Insufficient documentation

## 2014-06-12 DIAGNOSIS — Z8619 Personal history of other infectious and parasitic diseases: Secondary | ICD-10-CM | POA: Insufficient documentation

## 2014-06-12 DIAGNOSIS — I2510 Atherosclerotic heart disease of native coronary artery without angina pectoris: Secondary | ICD-10-CM | POA: Diagnosis not present

## 2014-06-12 DIAGNOSIS — N289 Disorder of kidney and ureter, unspecified: Secondary | ICD-10-CM

## 2014-06-12 DIAGNOSIS — Z8669 Personal history of other diseases of the nervous system and sense organs: Secondary | ICD-10-CM | POA: Insufficient documentation

## 2014-06-12 DIAGNOSIS — Z8739 Personal history of other diseases of the musculoskeletal system and connective tissue: Secondary | ICD-10-CM | POA: Insufficient documentation

## 2014-06-12 DIAGNOSIS — Z79899 Other long term (current) drug therapy: Secondary | ICD-10-CM | POA: Diagnosis not present

## 2014-06-12 DIAGNOSIS — I5022 Chronic systolic (congestive) heart failure: Secondary | ICD-10-CM | POA: Diagnosis not present

## 2014-06-12 DIAGNOSIS — Z8673 Personal history of transient ischemic attack (TIA), and cerebral infarction without residual deficits: Secondary | ICD-10-CM | POA: Diagnosis not present

## 2014-06-12 DIAGNOSIS — I129 Hypertensive chronic kidney disease with stage 1 through stage 4 chronic kidney disease, or unspecified chronic kidney disease: Secondary | ICD-10-CM | POA: Diagnosis not present

## 2014-06-12 DIAGNOSIS — E669 Obesity, unspecified: Secondary | ICD-10-CM | POA: Insufficient documentation

## 2014-06-12 DIAGNOSIS — E119 Type 2 diabetes mellitus without complications: Secondary | ICD-10-CM | POA: Insufficient documentation

## 2014-06-12 LAB — URINALYSIS, ROUTINE W REFLEX MICROSCOPIC
BILIRUBIN URINE: NEGATIVE
GLUCOSE, UA: 100 mg/dL — AB
KETONES UR: NEGATIVE mg/dL
Leukocytes, UA: NEGATIVE
Nitrite: NEGATIVE
PROTEIN: 100 mg/dL — AB
Specific Gravity, Urine: 1.015 (ref 1.005–1.030)
Urobilinogen, UA: 1 mg/dL (ref 0.0–1.0)
pH: 6 (ref 5.0–8.0)

## 2014-06-12 LAB — CBC WITH DIFFERENTIAL/PLATELET
BASOS PCT: 0 % (ref 0–1)
Basophils Absolute: 0 10*3/uL (ref 0.0–0.1)
Eosinophils Absolute: 0 10*3/uL (ref 0.0–0.7)
Eosinophils Relative: 1 % (ref 0–5)
HEMATOCRIT: 28.3 % — AB (ref 39.0–52.0)
HEMOGLOBIN: 8.9 g/dL — AB (ref 13.0–17.0)
Lymphocytes Relative: 25 % (ref 12–46)
Lymphs Abs: 1 10*3/uL (ref 0.7–4.0)
MCH: 25.9 pg — ABNORMAL LOW (ref 26.0–34.0)
MCHC: 31.4 g/dL (ref 30.0–36.0)
MCV: 82.5 fL (ref 78.0–100.0)
MONO ABS: 1.4 10*3/uL — AB (ref 0.1–1.0)
MONOS PCT: 37 % — AB (ref 3–12)
NEUTROS ABS: 1.4 10*3/uL — AB (ref 1.7–7.7)
Neutrophils Relative %: 37 % — ABNORMAL LOW (ref 43–77)
Platelets: 135 10*3/uL — ABNORMAL LOW (ref 150–400)
RBC: 3.43 MIL/uL — ABNORMAL LOW (ref 4.22–5.81)
RDW: 14.8 % (ref 11.5–15.5)
WBC: 3.8 10*3/uL — ABNORMAL LOW (ref 4.0–10.5)

## 2014-06-12 LAB — URINE MICROSCOPIC-ADD ON

## 2014-06-12 LAB — COMPREHENSIVE METABOLIC PANEL
ALBUMIN: 3.5 g/dL (ref 3.5–5.2)
ALT: <5 U/L (ref 0–53)
ANION GAP: 10 (ref 5–15)
AST: 5 U/L (ref 0–37)
Alkaline Phosphatase: 69 U/L (ref 39–117)
BUN: 19 mg/dL (ref 6–23)
CO2: 25 mEq/L (ref 19–32)
CREATININE: 1.57 mg/dL — AB (ref 0.50–1.35)
Calcium: 8.5 mg/dL (ref 8.4–10.5)
Chloride: 103 mEq/L (ref 96–112)
GFR calc Af Amer: 47 mL/min — ABNORMAL LOW (ref >90)
GFR, EST NON AFRICAN AMERICAN: 41 mL/min — AB (ref >90)
Glucose, Bld: 170 mg/dL — ABNORMAL HIGH (ref 70–99)
Potassium: 4 mEq/L (ref 3.7–5.3)
Sodium: 138 mEq/L (ref 137–147)
Total Bilirubin: 0.3 mg/dL (ref 0.3–1.2)
Total Protein: 7.5 g/dL (ref 6.0–8.3)

## 2014-06-12 LAB — I-STAT CG4 LACTIC ACID, ED: Lactic Acid, Venous: 0.8 mmol/L (ref 0.5–2.2)

## 2014-06-12 LAB — CK: Total CK: 105 U/L (ref 7–232)

## 2014-06-12 LAB — TROPONIN I: Troponin I: <0.3 ng/mL (ref <0.30)

## 2014-06-12 NOTE — ED Notes (Signed)
Ambulated patient with assist to restroom without any difficulty or incident.

## 2014-06-12 NOTE — ED Notes (Signed)
[name], SW, aware of pt.

## 2014-06-12 NOTE — ED Notes (Signed)
Frankey Poot, SW, on phone w/daughter.

## 2014-06-12 NOTE — ED Notes (Signed)
MD Glick at bedside. 

## 2014-06-12 NOTE — ED Notes (Signed)
Per [facility], pt from home. Pt became weak while walking from his car to his apartment. Pt currently lives with his daughter, pt reported he attempted to call his daughter several times to help him up and she would not come outside or answer the phone. Pt was able to sit himself down to the ground, pt denies falling to the ground or hitting his head. Pt also reported to EMS he is trying to get assistance into a assisted living home. Pt states he does not have a place to sleep at his daughters house, states he sleeps on the couch. Pt alert and oriented x4, no distress noted, no neuro deficits noted, grips and strengths are equal bilateral. Pt has a "twitch" to his Left eye x3 years

## 2014-06-12 NOTE — ED Notes (Signed)
Mariann Laster, CM, in w/pt.

## 2014-06-12 NOTE — ED Notes (Signed)
Pt is going to Pod C to attempt to reach pt's daughter, daughter's phone goes straight to voicemail. Pt is also requesting assistant with an assistive living replacement.

## 2014-06-12 NOTE — Discharge Instructions (Signed)
Have your primary care provider recheck your creatinine (blood test of your kidney function).  Weakness Weakness is a lack of strength. It may be felt all over the body (generalized) or in one specific part of the body (focal). Some causes of weakness can be serious. You may need further medical evaluation, especially if you are elderly or you have a history of immunosuppression (such as chemotherapy or HIV), kidney disease, heart disease, or diabetes. CAUSES  Weakness can be caused by many different things, including:  Infection.  Physical exhaustion.  Internal bleeding or other blood loss that results in a lack of red blood cells (anemia).  Dehydration. This cause is more common in elderly people.  Side effects or electrolyte abnormalities from medicines, such as pain medicines or sedatives.  Emotional distress, anxiety, or depression.  Circulation problems, especially severe peripheral arterial disease.  Heart disease, such as rapid atrial fibrillation, bradycardia, or heart failure.  Nervous system disorders, such as Guillain-Barr syndrome, multiple sclerosis, or stroke. DIAGNOSIS  To find the cause of your weakness, your caregiver will take your history and perform a physical exam. Lab tests or X-rays may also be ordered, if needed. TREATMENT  Treatment of weakness depends on the cause of your symptoms and can vary greatly. HOME CARE INSTRUCTIONS   Rest as needed.  Eat a well-balanced diet.  Try to get some exercise every day.  Only take over-the-counter or prescription medicines as directed by your caregiver. SEEK MEDICAL CARE IF:   Your weakness seems to be getting worse or spreads to other parts of your body.  You develop new aches or pains. SEEK IMMEDIATE MEDICAL CARE IF:   You cannot perform your normal daily activities, such as getting dressed and feeding yourself.  You cannot walk up and down stairs, or you feel exhausted when you do so.  You have  shortness of breath or chest pain.  You have difficulty moving parts of your body.  You have weakness in only one area of the body or on only one side of the body.  You have a fever.  You have trouble speaking or swallowing.  You cannot control your bladder or bowel movements.  You have black or bloody vomit or stools. MAKE SURE YOU:  Understand these instructions.  Will watch your condition.  Will get help right away if you are not doing well or get worse. Document Released: 08/12/2005 Document Revised: 02/11/2012 Document Reviewed: 10/11/2011 [facility] Patient Information 2015 [place], [place]. This information is not intended to replace advice given to you by your health care provider. Make sure you discuss any questions you have with your health care provider.

## 2014-06-12 NOTE — ED Notes (Signed)
Pt denies taking his BP medication since having his last surgery. Pt is unsure of when that surgery was, his chart suggest in June. Pt states the BP medication makes him feel worse than better. Pt encouraged to talk to his physician about a different type of BP medication to prevent any other medical conditions. Pt states "I'm strong enough with out it." EDP Roxanne Mins at bedside during conversation.

## 2014-06-12 NOTE — Progress Notes (Signed)
CSW at 1000 attempted to contact the patient's daughter at [phone] (not in service) and [phone] (voicemail was full). Patient's daughter was unavailable and no message was able to be left.  CSW contacted the [employer] to request a safety check at the patient's home to attempt to contact the patient's daughter to make her aware of her father's status and to insure a safe environment for him to discharge from the ED.  CSW consulted with the CM about home health vs assisted living placement.  Patient was in an [facility] back in 02/2014.   CSW received telephone call from the patient's daughter at 30 stating she was going to be on her way to pick up her father and transport him home.  She stated she had no additional information other than she suspected her father was not taking his medications as prescribed.  [facility] Anyla Israelson Richardo Priest ED CSW [phone]

## 2014-06-12 NOTE — Progress Notes (Addendum)
ED CM spoke with ED CSW concerning patient. Patient is stable and cleared for discharge home. Patient is ambulatory and independent, and denies any difficulty obtaining medications. Staff has been unable to locate patient's daughter. ED CSW notified [employer] for safety check, daughter called back. Daughter states, she is on her way to pick patient up. Spoke with patient regarding f/u with PCP, provided print with PCP information, he was instructed to call tomorrow to schedule an appointment, also sent an in basket message to his PCP regarding patient and his needed follow up, and OP SW needs.  Becky RN on [employer] reviewed home  medications with patient prior to discharge. Patient discharged home with daughter in private vehicle. Placed a [facility] consult as well.  No further ED CM need identified.

## 2014-06-12 NOTE — ED Provider Notes (Signed)
CSN: [id number]     Arrival date & time 06/12/14  0046 History   First MD Initiated Contact with Patient 06/12/14 0054     Chief Complaint  Patient presents with  . Weakness     (Consider location/radiation/quality/duration/timing/severity/associated sxs/prior Treatment) Patient is a 80 y.o. male presenting with weakness. The history is provided by the patient.  Weakness  He states he was walking home from plane between the and as he was going up a hill leading to his apartment, he felt like his legs just wouldn't support him so he sat down. He denies falling. Denies any pain. He was unable to get up and maintenance of his car. He states he was not sitting for very long before the ambulance came. He denies chest pain, heaviness, tightness, or pressure. He denies fever, chills, sweats. He feels like he is back to normal. Of note, he has a history of hypertension but has not been taking his medications for approximately the last 4 months. He states that his medicines make him feel worse.  Past Medical History  Diagnosis Date  . Smoker   . Narcolepsy   . Hypertension   . BPH (benign prostatic hyperplasia)   . Arthritis   . Diabetes mellitus   . Neuropathy in diabetes   . Herpes   . Dyslipidemia   . Obesity   . Lymphedema     LE lymphedemia with hx of cellulitis.  . CKD (chronic kidney disease)   . Anemia   . History of GI bleed 06/2012    EGD unremarkable;  Colo with large cecal polyp (bx benign)  . Ischemic cardiomyopathy     Echo 06/19/12: EF 40-45%, mid to distal anteroseptal HK, moderate LAE, PASP 37  . Chronic systolic heart failure   . CAD (coronary artery disease)     a. probable underlying CAD;  b. Type 2 NSTEMi in setting of profound anemia (Hgb 3.6) from GI bleed in 06/2012 and WMA on echo;  c. med Rx due to CKD and GI bleed  . CHF (congestive heart failure)   . Atrial fibrillation    Past Surgical History  Procedure Laterality Date  . Leg surgery      Left leg  surgery. broken leg  . Esophagogastroduodenoscopy  06/19/2012    Procedure: ESOPHAGOGASTRODUODENOSCOPY (EGD);  Surgeon: Beryle Beams, MD;  Location: [facility] ENDOSCOPY;  Service: Endoscopy;  Laterality: N/A;  . Colonoscopy  06/21/2012    Procedure: COLONOSCOPY;  Surgeon: Juanita Craver, MD;  Location: [facility] ENDOSCOPY;  Service: Endoscopy;  Laterality: N/A;  . Colonoscopy Left 02/13/2014    Procedure: COLONOSCOPY;  Surgeon: Juanita Craver, MD;  Location: [place];  Service: Endoscopy;  Laterality: Left;  . Laparoscopic right colectomy Right 02/15/2014    Procedure: LAPAROSCOPIC ASISSTED RIGHT  COLECTOMY, POSSIBLE OPEN;  Surgeon: Rolm Bookbinder, MD;  Location: [place];  Service: General;  Laterality: Right;   Family History  Problem Relation Age of Onset  . Brain cancer Mother   . Diabetes type II Sister   . Narcolepsy Paternal Uncle    History  Substance Use Topics  . Smoking status: Former Smoker    Quit date: 08/26/1998  . Smokeless tobacco: Never Used  . Alcohol Use: No     Comment: in the past    Review of Systems  Neurological: Positive for weakness.  All other systems reviewed and are negative.     Allergies  Review of patient's allergies indicates no known allergies.  Home Medications  Prior to Admission medications   Medication Sig Start Date End Date Taking? Authorizing Provider  bisacodyl (DULCOLAX) 10 MG suppository Place 10 mg rectally as needed for moderate constipation (if no results from MOM).    Historical Provider, MD  furosemide (LASIX) 40 MG tablet Take 1 tablet (40 mg total) by mouth daily. 01/10/14   Denita Lung, MD  glipiZIDE (GLUCOTROL) 5 MG tablet Take 2.5 mg by mouth daily before breakfast. For DM    Historical Provider, MD  Magnesium Hydroxide (MILK OF MAGNESIA PO) Take 30 mLs by mouth as needed. If no BM in 3 days    Historical Provider, MD   BP 209/78  Pulse 84  Temp(Src) 98.2 F (36.8 C) (Oral)  Resp 17  SpO2 100% Physical Exam  Nursing note and  vitals reviewed.  80 year old male, resting comfortably and in no acute distress. Vital signs are significant for hypertension. Oxygen saturation is 100%, which is normal. Head is normocephalic and atraumatic. PERRLA, EOMI. Oropharynx is clear. Ptosis is present on the left with intermittent twitching which patient states is chronic. Neck is nontender and supple without adenopathy or JVD. Back is nontender and there is no CVA tenderness. Lungs are clear without rales, wheezes, or rhonchi. Chest is nontender. Heart has regular rate and rhythm without murmur. Abdomen is soft, flat, nontender without masses or hepatosplenomegaly and peristalsis is normoactive. Extremities have no cyanosis or edema, full range of motion is present. Skin is warm and dry without rash. Neurologic: Mental status is normal, cranial nerves are intact, there are no motor or sensory deficits.  ED Course  Procedures (including critical care time) Labs Review Results for orders placed during the hospital encounter of 06/12/14  CBC WITH DIFFERENTIAL      Result Value Ref Range   WBC 3.8 (*) 4.0 - 10.5 K/uL   RBC 3.43 (*) 4.22 - 5.81 MIL/uL   Hemoglobin 8.9 (*) 13.0 - 17.0 g/dL   HCT 28.3 (*) 39.0 - 52.0 %   MCV 82.5  78.0 - 100.0 fL   MCH 25.9 (*) 26.0 - 34.0 pg   MCHC 31.4  30.0 - 36.0 g/dL   RDW 14.8  11.5 - 15.5 %   Platelets 135 (*) 150 - 400 K/uL   Neutrophils Relative % 37 (*) 43 - 77 %   Neutro Abs 1.4 (*) 1.7 - 7.7 K/uL   Lymphocytes Relative 25  12 - 46 %   Lymphs Abs 1.0  0.7 - 4.0 K/uL   Monocytes Relative 37 (*) 3 - 12 %   Monocytes Absolute 1.4 (*) 0.1 - 1.0 K/uL   Eosinophils Relative 1  0 - 5 %   Eosinophils Absolute 0.0  0.0 - 0.7 K/uL   Basophils Relative 0  0 - 1 %   Basophils Absolute 0.0  0.0 - 0.1 K/uL  COMPREHENSIVE METABOLIC PANEL      Result Value Ref Range   Sodium 138  137 - 147 mEq/L   Potassium 4.0  3.7 - 5.3 mEq/L   Chloride 103  96 - 112 mEq/L   CO2 25  19 - 32 mEq/L    Glucose, Bld 170 (*) 70 - 99 mg/dL   BUN 19  6 - 23 mg/dL   Creatinine, Ser 1.57 (*) 0.50 - 1.35 mg/dL   Calcium 8.5  8.4 - 10.5 mg/dL   Total Protein 7.5  6.0 - 8.3 g/dL   Albumin 3.5  3.5 - 5.2 g/dL   AST  5  0 - 37 U/L   ALT <5  0 - 53 U/L   Alkaline Phosphatase 69  39 - 117 U/L   Total Bilirubin 0.3  0.3 - 1.2 mg/dL   GFR calc non Af Amer 41 (*) >90 mL/min   GFR calc Af Amer 47 (*) >90 mL/min   Anion gap 10  5 - 15  CK      Result Value Ref Range   Total CK 105  7 - 232 U/L  URINALYSIS, ROUTINE W REFLEX MICROSCOPIC      Result Value Ref Range   Color, Urine YELLOW  YELLOW   APPearance CLEAR  CLEAR   Specific Gravity, Urine 1.015  1.005 - 1.030   pH 6.0  5.0 - 8.0   Glucose, UA 100 (*) NEGATIVE mg/dL   Hgb urine dipstick SMALL (*) NEGATIVE   Bilirubin Urine NEGATIVE  NEGATIVE   Ketones, ur NEGATIVE  NEGATIVE mg/dL   Protein, ur 100 (*) NEGATIVE mg/dL   Urobilinogen, UA 1.0  0.0 - 1.0 mg/dL   Nitrite NEGATIVE  NEGATIVE   Leukocytes, UA NEGATIVE  NEGATIVE  TROPONIN I      Result Value Ref Range   Troponin I <0.30  <0.30 ng/mL  URINE MICROSCOPIC-ADD ON      Result Value Ref Range   WBC, UA 0-2  <3 WBC/hpf   RBC / HPF 3-6  <3 RBC/hpf  I-STAT CG4 LACTIC ACID, ED      Result Value Ref Range   Lactic Acid, Venous 0.80  0.5 - 2.2 mmol/L    EKG Interpretation   Date/Time:  Sunday June 12 2014 00:57:21 EDT Ventricular Rate:  74 PR Interval:  242 QRS Duration: 85 QT Interval:  421 QTC Calculation: 467 R Axis:   -6 Text Interpretation:  Sinus rhythm Prolonged PR interval Probable  anteroseptal infarct, recent When compared with ECG of 05/21/2014, No  significant change was found Confirmed by [facility]  MD, Haroon Shatto ([id number]) on  06/12/2014 1:04:00 AM      MDM   Final diagnoses:  Weakness  Renal insufficiency    Weakness of uncertain cause. Electrolytes will be checked as well as urinalysis.  Results are significant only for slight worsening of renal function. No  significant change in chronic anemia. No evidence of UTI. Patient requested to be allowed to stay in the ED overnight because there was no one to come and get him. While in the ED, he ambulated to the bathroom without difficulty and did not require any assistance. He is discharged with instructions to followup with his PCP to monitor his hemoglobin and creatinine.    Delora Fuel, MD 37/94/32 [id number]

## 2014-06-13 ENCOUNTER — Telehealth: Payer: Self-pay | Admitting: Family Medicine

## 2014-06-13 NOTE — Telephone Encounter (Signed)
Returned call to Mr.Cabanilla to see what he needed.  He states there is  A nurse coming out at 11:00 today to discuss his living situation.  I asked him how could I help and he said it is a 15 year story and he would need to call me back.  That he was 80 years old.

## 2014-06-20 ENCOUNTER — Other Ambulatory Visit: Payer: Self-pay | Admitting: Nurse Practitioner

## 2014-07-27 ENCOUNTER — Ambulatory Visit: Payer: Medicare Other | Admitting: Podiatry

## 2014-09-08 ENCOUNTER — Encounter (HOSPITAL_COMMUNITY): Payer: Self-pay | Admitting: Gastroenterology

## 2014-09-11 ENCOUNTER — Encounter (HOSPITAL_COMMUNITY): Payer: Self-pay | Admitting: Emergency Medicine

## 2014-09-11 ENCOUNTER — Emergency Department (HOSPITAL_COMMUNITY)
Admission: EM | Admit: 2014-09-11 | Discharge: 2014-09-11 | Disposition: A | Discharge status: Home / Self Care | Payer: Medicare Other | Attending: Emergency Medicine | Admitting: Emergency Medicine

## 2014-09-11 DIAGNOSIS — E119 Type 2 diabetes mellitus without complications: Secondary | ICD-10-CM | POA: Insufficient documentation

## 2014-09-11 DIAGNOSIS — I150 Renovascular hypertension: Secondary | ICD-10-CM

## 2014-09-11 DIAGNOSIS — Z87891 Personal history of nicotine dependence: Secondary | ICD-10-CM | POA: Diagnosis not present

## 2014-09-11 DIAGNOSIS — Z8619 Personal history of other infectious and parasitic diseases: Secondary | ICD-10-CM | POA: Diagnosis not present

## 2014-09-11 DIAGNOSIS — I2510 Atherosclerotic heart disease of native coronary artery without angina pectoris: Secondary | ICD-10-CM | POA: Diagnosis not present

## 2014-09-11 DIAGNOSIS — R040 Epistaxis: Secondary | ICD-10-CM

## 2014-09-11 DIAGNOSIS — Z79899 Other long term (current) drug therapy: Secondary | ICD-10-CM | POA: Diagnosis not present

## 2014-09-11 DIAGNOSIS — N189 Chronic kidney disease, unspecified: Secondary | ICD-10-CM | POA: Diagnosis not present

## 2014-09-11 DIAGNOSIS — I5022 Chronic systolic (congestive) heart failure: Secondary | ICD-10-CM | POA: Diagnosis not present

## 2014-09-11 DIAGNOSIS — Z8673 Personal history of transient ischemic attack (TIA), and cerebral infarction without residual deficits: Secondary | ICD-10-CM | POA: Diagnosis not present

## 2014-09-11 DIAGNOSIS — I129 Hypertensive chronic kidney disease with stage 1 through stage 4 chronic kidney disease, or unspecified chronic kidney disease: Secondary | ICD-10-CM | POA: Insufficient documentation

## 2014-09-11 DIAGNOSIS — Z87438 Personal history of other diseases of male genital organs: Secondary | ICD-10-CM | POA: Diagnosis not present

## 2014-09-11 DIAGNOSIS — E669 Obesity, unspecified: Secondary | ICD-10-CM | POA: Insufficient documentation

## 2014-09-11 DIAGNOSIS — Z8669 Personal history of other diseases of the nervous system and sense organs: Secondary | ICD-10-CM | POA: Diagnosis not present

## 2014-09-11 MED ORDER — AMLODIPINE BESYLATE 5 MG PO TABS
10.0000 mg | ORAL_TABLET | Freq: Once | ORAL | Status: AC
  Administered 2014-09-11: 10 mg via ORAL
  Filled 2014-09-11: qty 2

## 2014-09-11 NOTE — ED Provider Notes (Signed)
CSN: [id number]     Arrival date & time 09/11/14  1445 History   First MD Initiated Contact with Patient 09/11/14 1456     Chief Complaint  Patient presents with  . Hypertension  . Epistaxis     (Consider location/radiation/quality/duration/timing/severity/associated sxs/prior Treatment) Patient is a 81 y.o. male presenting with nosebleeds. The history is provided by the patient.  Epistaxis Location:  L nare Severity:  Moderate Duration:  20 minutes Timing:  Constant Progression:  Resolved Chronicity:  Recurrent Context: hypertension and nose picking   Context: not anticoagulants, not aspirin use, not drug use and not foreign body   Relieved by: placing gauze in the nose. Worsened by:  Nothing tried Ineffective treatments:  None tried Associated symptoms: no congestion, no cough, no dizziness, no facial pain, no headaches, no sinus pain, no sore throat and no syncope   Risk factors comment:  Patient states years ago he had a surgery on his sinus to remove a cyst. He will intermittently get left nosebleeds but nothing recent   Past Medical History  Diagnosis Date  . Smoker   . Narcolepsy   . Hypertension   . BPH (benign prostatic hyperplasia)   . Arthritis   . Diabetes mellitus   . Neuropathy in diabetes   . Herpes   . Dyslipidemia   . Obesity   . Lymphedema     LE lymphedemia with hx of cellulitis.  . CKD (chronic kidney disease)   . Anemia   . History of GI bleed 06/2012    EGD unremarkable;  Colo with large cecal polyp (bx benign)  . Ischemic cardiomyopathy     Echo 06/19/12: EF 40-45%, mid to distal anteroseptal HK, moderate LAE, PASP 37  . Chronic systolic heart failure   . CAD (coronary artery disease)     a. probable underlying CAD;  b. Type 2 NSTEMi in setting of profound anemia (Hgb 3.6) from GI bleed in 06/2012 and WMA on echo;  c. med Rx due to CKD and GI bleed  . CHF (congestive heart failure)   . Atrial fibrillation    Past Surgical History   Procedure Laterality Date  . Leg surgery      Left leg surgery. broken leg  . Esophagogastroduodenoscopy  06/19/2012    Procedure: ESOPHAGOGASTRODUODENOSCOPY (EGD);  Surgeon: Beryle Beams, MD;  Location: [facility] ENDOSCOPY;  Service: Endoscopy;  Laterality: N/A;  . Colonoscopy  06/21/2012    Procedure: COLONOSCOPY;  Surgeon: Juanita Craver, MD;  Location: [facility] ENDOSCOPY;  Service: Endoscopy;  Laterality: N/A;  . Colonoscopy Left 02/13/2014    Procedure: COLONOSCOPY;  Surgeon: Juanita Craver, MD;  Location: [place];  Service: Endoscopy;  Laterality: Left;  . Laparoscopic right colectomy Right 02/15/2014    Procedure: LAPAROSCOPIC ASISSTED RIGHT  COLECTOMY, POSSIBLE OPEN;  Surgeon: Rolm Bookbinder, MD;  Location: [place];  Service: General;  Laterality: Right;   Family History  Problem Relation Age of Onset  . Brain cancer Mother   . Diabetes type II Sister   . Narcolepsy Paternal Uncle    History  Substance Use Topics  . Smoking status: Former Smoker    Quit date: 08/26/1998  . Smokeless tobacco: Never Used  . Alcohol Use: No     Comment: in the past    Review of Systems  HENT: Positive for nosebleeds. Negative for congestion and sore throat.   Respiratory: Negative for cough.   Cardiovascular: Negative for syncope.  Neurological: Negative for dizziness and headaches.  All other  systems reviewed and are negative.     Allergies  Review of patient's allergies indicates no known allergies.  Home Medications   Prior to Admission medications   Medication Sig Start Date End Date Taking? Authorizing Provider  amLODipine (NORVASC) 10 MG tablet Take 10 mg by mouth daily.   Yes Historical Provider, MD  carvedilol (COREG) 12.5 MG tablet Take 12.5 mg by mouth 2 (two) times daily with a meal.   Yes Historical Provider, MD  furosemide (LASIX) 40 MG tablet Take 1 tablet (40 mg total) by mouth daily. 01/10/14   Denita Lung, MD   BP 238/106 mmHg  Pulse 89  Temp(Src) 98 F (36.7 C) (Oral)   Resp 16  SpO2 98% Physical Exam  Constitutional: He is oriented to person, place, and time. He appears well-developed and well-nourished. No distress.  HENT:  Head: Normocephalic and atraumatic.  Mouth/Throat: Oropharynx is clear and moist.  Dried blood in the left snare without any acute bleeding. No evidence of the source of bleeding that can be cauterized at this time  Eyes: Conjunctivae and EOM are normal. Pupils are equal, round, and reactive to light.  Neck: Normal range of motion. Neck supple.  Cardiovascular: Normal rate, regular rhythm and intact distal pulses.   No murmur heard. Pulmonary/Chest: Effort normal and breath sounds normal. No respiratory distress. He has no wheezes. He has no rales.  Musculoskeletal: Normal range of motion. He exhibits edema. He exhibits no tenderness.  Mild edema in the lower extremities.  Neurological: He is alert and oriented to person, place, and time.  Skin: Skin is warm and dry. No rash noted. No erythema.  Psychiatric: He has a normal mood and affect. His behavior is normal.  Nursing note and vitals reviewed.   ED Course  Procedures (including critical care time) Labs Review Labs Reviewed - No data to display  Imaging Review No results found.   EKG Interpretation None      MDM   Final diagnoses:  Anterior epistaxis  Renovascular hypertension    Patient with epistaxis today prior to arrival that has now been resolved for about 1 hour. Packing was removed without evidence of acute bleeding. Neo-Synephrine nasal spray used and patient monitored for rebleeding. He has had no rebleeding. Patient is hypertensive today however when questioned about taking his medication he denied using his medication today and unclear if he takes it on a regular basis.  He states if his blood pressure is in the high up and he does not living.  Discussed with him the importance of taking his blood pressure medication regularly.  We'll discharge  patient home to follow-up with his PCP.    Blanchie Dessert, MD 09/11/14 [phone]

## 2014-09-11 NOTE — ED Notes (Addendum)
Pt came from Home. PT had complaints of nosebleedx30 mins . EMS checked BP pt found to be Hypertension ( 208/110)> PT states, " I did not take my BP medication and Lasix because I am, tired of peeing".Pt denise any SOB or headache.

## 2014-09-27 ENCOUNTER — Encounter (HOSPITAL_COMMUNITY): Payer: Self-pay | Admitting: *Deleted

## 2014-09-27 ENCOUNTER — Emergency Department (HOSPITAL_COMMUNITY)
Admission: EM | Admit: 2014-09-27 | Discharge: 2014-09-27 | Disposition: A | Discharge status: Home / Self Care | Payer: Medicare Other | Attending: Emergency Medicine | Admitting: Emergency Medicine

## 2014-09-27 DIAGNOSIS — Z862 Personal history of diseases of the blood and blood-forming organs and certain disorders involving the immune mechanism: Secondary | ICD-10-CM | POA: Insufficient documentation

## 2014-09-27 DIAGNOSIS — I129 Hypertensive chronic kidney disease with stage 1 through stage 4 chronic kidney disease, or unspecified chronic kidney disease: Secondary | ICD-10-CM | POA: Insufficient documentation

## 2014-09-27 DIAGNOSIS — I2510 Atherosclerotic heart disease of native coronary artery without angina pectoris: Secondary | ICD-10-CM | POA: Diagnosis not present

## 2014-09-27 DIAGNOSIS — Z79899 Other long term (current) drug therapy: Secondary | ICD-10-CM | POA: Diagnosis not present

## 2014-09-27 DIAGNOSIS — N189 Chronic kidney disease, unspecified: Secondary | ICD-10-CM | POA: Insufficient documentation

## 2014-09-27 DIAGNOSIS — Z8619 Personal history of other infectious and parasitic diseases: Secondary | ICD-10-CM | POA: Insufficient documentation

## 2014-09-27 DIAGNOSIS — Z8739 Personal history of other diseases of the musculoskeletal system and connective tissue: Secondary | ICD-10-CM | POA: Diagnosis not present

## 2014-09-27 DIAGNOSIS — E119 Type 2 diabetes mellitus without complications: Secondary | ICD-10-CM | POA: Diagnosis not present

## 2014-09-27 DIAGNOSIS — E669 Obesity, unspecified: Secondary | ICD-10-CM | POA: Diagnosis not present

## 2014-09-27 DIAGNOSIS — E1140 Type 2 diabetes mellitus with diabetic neuropathy, unspecified: Secondary | ICD-10-CM | POA: Diagnosis not present

## 2014-09-27 DIAGNOSIS — Z8719 Personal history of other diseases of the digestive system: Secondary | ICD-10-CM | POA: Insufficient documentation

## 2014-09-27 DIAGNOSIS — I5022 Chronic systolic (congestive) heart failure: Secondary | ICD-10-CM | POA: Diagnosis not present

## 2014-09-27 DIAGNOSIS — Z87891 Personal history of nicotine dependence: Secondary | ICD-10-CM | POA: Diagnosis not present

## 2014-09-27 DIAGNOSIS — I4891 Unspecified atrial fibrillation: Secondary | ICD-10-CM | POA: Diagnosis not present

## 2014-09-27 DIAGNOSIS — R040 Epistaxis: Secondary | ICD-10-CM | POA: Diagnosis not present

## 2014-09-27 MED ORDER — CARVEDILOL PHOSPHATE ER 10 MG PO CP24
10.0000 mg | ORAL_CAPSULE | Freq: Once | ORAL | Status: AC
  Administered 2014-09-27: 10 mg via ORAL
  Filled 2014-09-27: qty 1

## 2014-09-27 MED ORDER — AMLODIPINE BESYLATE 5 MG PO TABS
10.0000 mg | ORAL_TABLET | Freq: Once | ORAL | Status: AC
  Administered 2014-09-27: 10 mg via ORAL
  Filled 2014-09-27: qty 2

## 2014-09-27 NOTE — ED Notes (Signed)
Pt. Has frequent nose bleeds. Pt. Nose started bleeding at 0430 this morning and called EMS. On EMS arrival pt. Had soaked through gauze and was standing over the sink with the blood dripping in the sink. Pt. Has a hx of HTN and was 206/106 on EMS arrival. HR 100. Pt. Not on blood thinners

## 2014-09-27 NOTE — ED Provider Notes (Signed)
7:12 AM Pt does not want rehab, nursing home, assisted living or independent living situation. He would like his own apartment.  Message left with social work to contact pt on his cell phone [phone]. Dc in stable condition from the [place], MD 09/27/14 [phone]

## 2014-09-27 NOTE — Discharge Instructions (Signed)
Keep the packing in place until you see the ENT physician (Information above for Dr. Benjamine Mola). Keep packing slightly moist with saline spray each day. Continue to take your Blood Pressure mediation.  Nosebleed A nosebleed can be caused by many things, including:  Getting hit hard in the nose.  Infections.  Dry nose.  Colds.  Medicines. Your doctor may do lab testing if you get nosebleeds a lot and the cause is not known. HOME CARE   If your nose was packed with material, keep it there until your doctor takes it out. Put the pack back in your nose if the pack falls out.  Do not blow your nose for 12 hours after the nosebleed.  Sit up and bend forward if your nose starts bleeding again. Pinch the front half of your nose nonstop for 20 minutes.  Put petroleum jelly inside your nose every morning if you have a dry nose.  Use a humidifier to make the air less dry.  Do not take aspirin.  Try not to strain, lift, or bend at the waist for many days after the nosebleed. GET HELP RIGHT AWAY IF:   Nosebleeds keep happening and are hard to stop or control.  You have bleeding or bruises that are not normal on other parts of the body.  You have a fever.  The nosebleeds get worse.  You get lightheaded, feel faint, sweaty, or throw up (vomit) blood. MAKE SURE YOU:   Understand these instructions.  Will watch your condition.  Will get help right away if you are not doing well or get worse. Document Released: 05/21/2008 Document Revised: 11/04/2011 Document Reviewed: 05/21/2008 [facility] Patient Information 2015 [place], [place]. This information is not intended to replace advice given to you by your health care provider. Make sure you discuss any questions you have with your health care provider.

## 2014-09-27 NOTE — ED Provider Notes (Signed)
CSN: [id number]     Arrival date & time 09/27/14  0519 History   First MD Initiated Contact with Patient 09/27/14 [phone]     Chief Complaint  Patient presents with  . Epistaxis      HPI  Patient presents for evaluation of nosebleed. States she's had nosebleeds since he was a kid. States that one point he had a "cyst removed from my nose". Seen and evaluated just a few weeks ago. Was discharged without need for intervention. Nosebleed stopped spontaneously.  Current episode began acutely this morning during sleep. Paramedics state he was leaning over his sink and had pinched his nose and bleeding had incompletely resolved.  Past Medical History  Diagnosis Date  . Smoker   . Narcolepsy   . Hypertension   . BPH (benign prostatic hyperplasia)   . Arthritis   . Diabetes mellitus   . Neuropathy in diabetes   . Herpes   . Dyslipidemia   . Obesity   . Lymphedema     LE lymphedemia with hx of cellulitis.  . CKD (chronic kidney disease)   . Anemia   . History of GI bleed 06/2012    EGD unremarkable;  Colo with large cecal polyp (bx benign)  . Ischemic cardiomyopathy     Echo 06/19/12: EF 40-45%, mid to distal anteroseptal HK, moderate LAE, PASP 37  . Chronic systolic heart failure   . CAD (coronary artery disease)     a. probable underlying CAD;  b. Type 2 NSTEMi in setting of profound anemia (Hgb 3.6) from GI bleed in 06/2012 and WMA on echo;  c. med Rx due to CKD and GI bleed  . CHF (congestive heart failure)   . Atrial fibrillation    Past Surgical History  Procedure Laterality Date  . Leg surgery      Left leg surgery. broken leg  . Esophagogastroduodenoscopy  06/19/2012    Procedure: ESOPHAGOGASTRODUODENOSCOPY (EGD);  Surgeon: Beryle Beams, MD;  Location: [facility] ENDOSCOPY;  Service: Endoscopy;  Laterality: N/A;  . Colonoscopy  06/21/2012    Procedure: COLONOSCOPY;  Surgeon: Juanita Craver, MD;  Location: [facility] ENDOSCOPY;  Service: Endoscopy;  Laterality: N/A;  . Colonoscopy Left  02/13/2014    Procedure: COLONOSCOPY;  Surgeon: Juanita Craver, MD;  Location: [place];  Service: Endoscopy;  Laterality: Left;  . Laparoscopic right colectomy Right 02/15/2014    Procedure: LAPAROSCOPIC ASISSTED RIGHT  COLECTOMY, POSSIBLE OPEN;  Surgeon: Rolm Bookbinder, MD;  Location: [place];  Service: General;  Laterality: Right;   Family History  Problem Relation Age of Onset  . Brain cancer Mother   . Diabetes type II Sister   . Narcolepsy Paternal Uncle    History  Substance Use Topics  . Smoking status: Former Smoker    Quit date: 08/26/1998  . Smokeless tobacco: Never Used  . Alcohol Use: No     Comment: in the past    Review of Systems  Constitutional: Negative for fever, chills, diaphoresis, appetite change and fatigue.  HENT: Positive for nosebleeds. Negative for mouth sores, sore throat and trouble swallowing.   Eyes: Negative for visual disturbance.  Respiratory: Negative for cough, chest tightness, shortness of breath and wheezing.   Cardiovascular: Negative for chest pain.  Gastrointestinal: Negative for nausea, vomiting, abdominal pain, diarrhea and abdominal distention.  Endocrine: Negative for polydipsia, polyphagia and polyuria.  Genitourinary: Negative for dysuria, frequency and hematuria.  Musculoskeletal: Negative for gait problem.  Skin: Negative for color change, pallor and rash.  Neurological:  Negative for dizziness, syncope, light-headedness and headaches.  Hematological: Does not bruise/bleed easily.  Psychiatric/Behavioral: Negative for behavioral problems and confusion.      Allergies  Review of patient's allergies indicates no known allergies.  Home Medications   Prior to Admission medications   Medication Sig Start Date End Date Taking? Authorizing Provider  amLODipine (NORVASC) 10 MG tablet Take 10 mg by mouth daily.   Yes Historical Provider, MD  carvedilol (COREG) 12.5 MG tablet Take 12.5 mg by mouth 2 (two) times daily with a meal.    Yes Historical Provider, MD  furosemide (LASIX) 40 MG tablet Take 1 tablet (40 mg total) by mouth daily. 01/10/14  Yes Denita Lung, MD   BP 204/76 mmHg  Pulse 76  Temp(Src) 98.1 F (36.7 C) (Oral)  Resp 19  SpO2 99% Physical Exam  Constitutional: He is oriented to person, place, and time. He appears well-developed and well-nourished. No distress.  HENT:  Head: Normocephalic.  Nose:    Eyes: Conjunctivae are normal. Pupils are equal, round, and reactive to light. No scleral icterus.  Neck: Normal range of motion. Neck supple. No thyromegaly present.  Cardiovascular: Normal rate and regular rhythm.  Exam reveals no gallop and no friction rub.   No murmur heard. Symmetric bilateral lower extremity edema.  Pulmonary/Chest: Effort normal and breath sounds normal. No respiratory distress. He has no wheezes. He has no rales.  Abdominal: Soft. Bowel sounds are normal. He exhibits no distension. There is no tenderness. There is no rebound.  Musculoskeletal: Normal range of motion.  Neurological: He is alert and oriented to person, place, and time.  Skin: Skin is warm and dry. No rash noted.  Psychiatric: He has a normal mood and affect. His behavior is normal.    ED Course  Procedures (including critical care time) Labs Review Labs Reviewed - No data to display  Imaging Review No results found.   EKG Interpretation None      MDM   Final diagnoses:  Epistaxis    Cotton with Neo-Synephrine placed in the nose. Removed after 10 minutes.. Source of bleeding on the left septum. Not directly anterior (adjacent to inferior turbinate on septum).  After cotton removed, Merocel sponge placed. No additional bleeding noted. Patient given a.m. blood pressure medications.  On recheck no additional bleeding noted. When I was speaking with patient about discharge and follow-up he states "I just can't go back there were my daughter lives. I got's to have somewhere else to stay. I don't even  have a bed to sleep on. I have to sleep on the couch".  Pt states that "when I had my colon cancer" (July 2015) "they wanted me to go to a nursing home.  I think that I need to now because my daughter wants to use up all my money and spend it on her boyfriend"  Patient states "I have plenty of money to go live somewhere else".  Pt requests talk to social work before discharge.    Tanna Furry, MD 09/27/14 [phone]

## 2014-10-20 ENCOUNTER — Encounter (HOSPITAL_COMMUNITY): Payer: Self-pay | Admitting: General Practice

## 2014-10-20 ENCOUNTER — Emergency Department (HOSPITAL_COMMUNITY)
Admission: EM | Admit: 2014-10-20 | Discharge: 2014-10-20 | Disposition: A | Discharge status: Home / Self Care | Payer: Medicare Other | Attending: Emergency Medicine | Admitting: Emergency Medicine

## 2014-10-20 ENCOUNTER — Emergency Department (HOSPITAL_COMMUNITY): Payer: Medicare Other

## 2014-10-20 DIAGNOSIS — W1839XA Other fall on same level, initial encounter: Secondary | ICD-10-CM | POA: Insufficient documentation

## 2014-10-20 DIAGNOSIS — Y998 Other external cause status: Secondary | ICD-10-CM | POA: Insufficient documentation

## 2014-10-20 DIAGNOSIS — Z8619 Personal history of other infectious and parasitic diseases: Secondary | ICD-10-CM | POA: Diagnosis not present

## 2014-10-20 DIAGNOSIS — I5022 Chronic systolic (congestive) heart failure: Secondary | ICD-10-CM | POA: Diagnosis not present

## 2014-10-20 DIAGNOSIS — I2510 Atherosclerotic heart disease of native coronary artery without angina pectoris: Secondary | ICD-10-CM | POA: Diagnosis not present

## 2014-10-20 DIAGNOSIS — Z87448 Personal history of other diseases of urinary system: Secondary | ICD-10-CM | POA: Insufficient documentation

## 2014-10-20 DIAGNOSIS — I890 Lymphedema, not elsewhere classified: Secondary | ICD-10-CM | POA: Diagnosis not present

## 2014-10-20 DIAGNOSIS — S61412A Laceration without foreign body of left hand, initial encounter: Secondary | ICD-10-CM

## 2014-10-20 DIAGNOSIS — Z862 Personal history of diseases of the blood and blood-forming organs and certain disorders involving the immune mechanism: Secondary | ICD-10-CM | POA: Diagnosis not present

## 2014-10-20 DIAGNOSIS — E1140 Type 2 diabetes mellitus with diabetic neuropathy, unspecified: Secondary | ICD-10-CM | POA: Diagnosis not present

## 2014-10-20 DIAGNOSIS — I129 Hypertensive chronic kidney disease with stage 1 through stage 4 chronic kidney disease, or unspecified chronic kidney disease: Secondary | ICD-10-CM | POA: Insufficient documentation

## 2014-10-20 DIAGNOSIS — N189 Chronic kidney disease, unspecified: Secondary | ICD-10-CM | POA: Insufficient documentation

## 2014-10-20 DIAGNOSIS — Z8739 Personal history of other diseases of the musculoskeletal system and connective tissue: Secondary | ICD-10-CM | POA: Diagnosis not present

## 2014-10-20 DIAGNOSIS — Z8719 Personal history of other diseases of the digestive system: Secondary | ICD-10-CM | POA: Diagnosis not present

## 2014-10-20 DIAGNOSIS — S60932A Unspecified superficial injury of left thumb, initial encounter: Secondary | ICD-10-CM | POA: Diagnosis present

## 2014-10-20 DIAGNOSIS — Z9114 Patient's other noncompliance with medication regimen: Secondary | ICD-10-CM | POA: Insufficient documentation

## 2014-10-20 DIAGNOSIS — Z79899 Other long term (current) drug therapy: Secondary | ICD-10-CM | POA: Diagnosis not present

## 2014-10-20 DIAGNOSIS — Y9289 Other specified places as the place of occurrence of the external cause: Secondary | ICD-10-CM | POA: Insufficient documentation

## 2014-10-20 DIAGNOSIS — W19XXXA Unspecified fall, initial encounter: Secondary | ICD-10-CM

## 2014-10-20 DIAGNOSIS — S61002A Unspecified open wound of left thumb without damage to nail, initial encounter: Secondary | ICD-10-CM | POA: Diagnosis not present

## 2014-10-20 DIAGNOSIS — E669 Obesity, unspecified: Secondary | ICD-10-CM | POA: Diagnosis not present

## 2014-10-20 DIAGNOSIS — Z87891 Personal history of nicotine dependence: Secondary | ICD-10-CM | POA: Diagnosis not present

## 2014-10-20 DIAGNOSIS — Z23 Encounter for immunization: Secondary | ICD-10-CM | POA: Diagnosis not present

## 2014-10-20 DIAGNOSIS — Y9389 Activity, other specified: Secondary | ICD-10-CM | POA: Insufficient documentation

## 2014-10-20 LAB — CBC WITH DIFFERENTIAL/PLATELET
Basophils Absolute: 0 K/uL (ref 0.0–0.1)
Basophils Relative: 0 % (ref 0–1)
Eosinophils Absolute: 0 K/uL (ref 0.0–0.7)
Eosinophils Relative: 1 % (ref 0–5)
HCT: 29.6 % — ABNORMAL LOW (ref 39.0–52.0)
Hemoglobin: 8.9 g/dL — ABNORMAL LOW (ref 13.0–17.0)
Lymphocytes Relative: 24 % (ref 12–46)
Lymphs Abs: 0.9 K/uL (ref 0.7–4.0)
MCH: 22.9 pg — ABNORMAL LOW (ref 26.0–34.0)
MCHC: 30.1 g/dL (ref 30.0–36.0)
MCV: 76.1 fL — ABNORMAL LOW (ref 78.0–100.0)
Monocytes Absolute: 1 K/uL (ref 0.1–1.0)
Monocytes Relative: 28 % — ABNORMAL HIGH (ref 3–12)
Neutro Abs: 1.8 K/uL (ref 1.7–7.7)
Neutrophils Relative %: 47 % (ref 43–77)
Platelets: 149 K/uL — ABNORMAL LOW (ref 150–400)
RBC: 3.89 MIL/uL — ABNORMAL LOW (ref 4.22–5.81)
RDW: 15.9 % — ABNORMAL HIGH (ref 11.5–15.5)
WBC: 3.7 K/uL — ABNORMAL LOW (ref 4.0–10.5)

## 2014-10-20 LAB — BASIC METABOLIC PANEL WITH GFR
Anion gap: 10 (ref 5–15)
BUN: 16 mg/dL (ref 6–23)
CO2: 24 mmol/L (ref 19–32)
Calcium: 8.6 mg/dL (ref 8.4–10.5)
Chloride: 105 mmol/L (ref 96–112)
Creatinine, Ser: 1.59 mg/dL — ABNORMAL HIGH (ref 0.50–1.35)
GFR calc Af Amer: 46 mL/min — ABNORMAL LOW
GFR calc non Af Amer: 40 mL/min — ABNORMAL LOW
Glucose, Bld: 133 mg/dL — ABNORMAL HIGH (ref 70–99)
Potassium: 4.1 mmol/L (ref 3.5–5.1)
Sodium: 139 mmol/L (ref 135–145)

## 2014-10-20 LAB — PROTIME-INR
INR: 1.1 (ref 0.00–1.49)
Prothrombin Time: 14.3 seconds (ref 11.6–15.2)

## 2014-10-20 LAB — BRAIN NATRIURETIC PEPTIDE: B Natriuretic Peptide: 599.1 pg/mL — ABNORMAL HIGH (ref 0.0–100.0)

## 2014-10-20 MED ORDER — TETANUS-DIPHTH-ACELL PERTUSSIS 5-2.5-18.5 LF-MCG/0.5 IM SUSP
0.5000 mL | Freq: Once | INTRAMUSCULAR | Status: AC
  Administered 2014-10-20: 0.5 mL via INTRAMUSCULAR
  Filled 2014-10-20: qty 0.5

## 2014-10-20 MED ORDER — FUROSEMIDE 10 MG/ML IJ SOLN
40.0000 mg | Freq: Once | INTRAMUSCULAR | Status: AC
  Administered 2014-10-20: 40 mg via INTRAVENOUS
  Filled 2014-10-20: qty 4

## 2014-10-20 NOTE — ED Provider Notes (Signed)
Patient reports he tripped and fell today. He was not using his walker. He suffered  wound to his left thumb as result of event. He was feeling well prior to the event. Patient reports he stopped taking his antihypertensive medications one week ago as they made him lightheaded. He did not suffer syncope. Patient states he presently feels well. On exam no distress lungs clear auscultation heart regular rate and rhythm abdomen nondistended nontender bilateral lower extremities with 4+ edema.   Orlie Dakin, MD 10/21/14 [phone]

## 2014-10-20 NOTE — ED Notes (Signed)
Pt brought in via EMS. Pt tripped walking in [place]. Pt has laceration to left thumb. Pt denies any loss of consciousness and denies neck and back pain. Pt A/O. V/S 198/110, HR 88, Sp02 97% R/A. CBG 153. Pt was found to be in A-fib. Pt has a history of a-fib, CHF, and is noncompliant with medications. Pt stopped taking medications due to it causing him to "pass out".

## 2014-10-20 NOTE — Discharge Instructions (Signed)
Fall Prevention and Home Safety Falls cause injuries and can affect all age groups. It is possible to prevent falls.  HOW TO PREVENT FALLS  Wear shoes with rubber soles that do not have an opening for your toes.  Keep the inside and outside of your house well lit.  Use night lights throughout your home.  Remove clutter from floors.  Clean up floor spills.  Remove throw rugs or fasten them to the floor with carpet tape.  Do not place electrical cords across pathways.  Put grab bars by your tub, shower, and toilet. Do not use towel bars as grab bars.  Put handrails on both sides of the stairway. Fix loose handrails.  Do not climb on stools or stepladders, if possible.  Do not wax your floors.  Repair uneven or unsafe sidewalks, walkways, or stairs.  Keep items you use a lot within reach.  Be aware of pets.  Keep emergency numbers next to the telephone.  Put smoke detectors in your home and near bedrooms. Ask your doctor what other things you can do to prevent falls. Document Released: 06/08/2009 Document Revised: 02/11/2012 Document Reviewed: 11/12/2011 [facility] Patient Information 2015 [place], [place]. This information is not intended to replace advice given to you by your health care provider. Make sure you discuss any questions you have with your health care provider.  Lymphedema Lymphedema is a swelling caused by the abnormal collection of lymph under the skin. The lymph is fluid from the tissues in your body that travels in the lymphatic system. This system is part of the immune system that includes lymph nodes and vessels. The lymph vessels collect and carry the excess fluid, fats, proteins, and wastes from the tissues of the body to the bloodstream. This system also works to clean and remove bacteria and waste products from the body.  Lymphedema occurs when the lymphatic system is blocked. When the lymph vessels or lymph nodes are blocked or damaged, lymph does not drain  properly. This causes abnormal build up of lymph. This leads to swelling in the arms or legs. Lymphedema cannot be cured by medicines. But the swelling can be reduced by physical methods. CAUSES  There are two types of lymphedema. Primary lymphedema is caused by the absence or abnormality of the lymph vessel at birth. It is also known as inherited lymphedema, which occurs rarely. Secondary or acquired lymphedema occurs when the lymph vessel is damaged or blocked. The causes of lymph vessel blockage are:   Skin infection like cellulites.  Infection by parasites (filariasis).  Injury.  Cancer.  Radiation therapy.  Formation of scar tissue.  Surgery. SYMPTOMS  The symptoms of lymphedema are:  Abnormal swelling of the arm or leg.  Heavy or tight feeling in your arm or leg.  Tight-fitting shoes or rings.  Redness of skin over the affected area.  Limited movement of the affected limb.  Some patients complain about sensitivity to touch and discomfort in the limb(s) affected. You may not have these symptoms immediately following injury. They usually appear within a few days or even years after injury. Inform your caregiver, if you have any of these symptoms. Early treatment can avoid further problems.  DIAGNOSIS  First, your caregiver will inquire about any surgery you have had or medicines you are taking. He will then examine you. Your caregiver may order special imaging tests, such as:  Lymphoscintigraphy (a test in which a low dose of radioactive substance is injected to trace the flow of lymph through  the lymph vessels).  MRI (imaging tests using magnetic fields).  Computed tomography (test using special cross-sectional X-rays).  Duplex ultrasound (test using high-frequency sound waves to show the vessels and the blood flow on a screen).  Lymphangiography (special X-ray taken after injecting a contrast dye into the lymph vessel). It is now rarely done. TREATMENT  Lymphedema  can be treated in different ways. Your caregiver will decide the type of treatment depending on the cause. Treatment may include:  Exercise: Special exercises will help fluid move out easily from the affected part. This should be done as per your caregiver's advice.  Manual lymph drainage: Gentle massage of the affected limb makes the fluid to move out more freely.  Compression: Compression stockings or external pump apply pressure over the affected limb. This helps the fluid to move out from the arm or leg. Bandaging can also help to move the fluid out from the affected part. Your caregiver will decide the method that suits you the best.  Medicines: Your caregiver may prescribe antibiotics, if you have infection.  Surgery: Your caregiver may advise surgery for severe lymphedema. It is reserved for special cases when the patient has difficulty moving. Your surgeon may remove excess tissue from the arm or leg. This will help to ease your movement. Physical therapy may have to be continued after surgery. HOME CARE INSTRUCTIONS  The area is very fragile and is predisposed to injury and infection.  Eat a healthy diet.  Exercise regularly as per advice.  Keep the affected area clean and dry.  Use gloves while cooking or gardening.  Protect your skin from cuts.  Use electric razor to shave the affected area.  Keep affected limb elevated.  Do not wear tight clothes, shoes, or jewelry as it may cause the tissue to be strangled.  Do not use heat pads over the affected area.  Do not sit with cross legs.  Do not walk barefoot.  Do not carry weight on the affected arm.  Avoid having blood pressure checked on the affected limb. SEEK MEDICAL CARE IF:  You continue to have swelling in your limb. SEEK IMMEDIATE MEDICAL CARE IF:   You have high fever.  You have skin rash.  You have chills or sweats.  You have pain or redness.  You have a cut that does not heal. MAKE SURE YOU:    Understand these instructions.  Will watch your condition.  Will get help right away if you are not doing well or get worse. Document Released: 06/09/2007 Document Revised: 07/29/2012 Document Reviewed: 05/15/2009 [facility] Patient Information 2015 [place], [place]. This information is not intended to replace advice given to you by your health care provider. Make sure you discuss any questions you have with your health care provider.

## 2014-10-20 NOTE — ED Notes (Signed)
EDP notified of pt BP. Pt asymptomatic. No new orders at this time.

## 2014-10-20 NOTE — ED Notes (Signed)
Spoke with Doctor regarding blood pressure.  Ordered patient to be discharged. Explained to patient importance of taking blood pressure medication verbalized understanding.

## 2014-10-20 NOTE — ED Provider Notes (Signed)
CSN: [id number]     Arrival date & time 10/20/14  1421 History   First MD Initiated Contact with Patient 10/20/14 1456     Chief Complaint  Patient presents with  . Fall   (Consider location/radiation/quality/duration/timing/severity/associated sxs/prior Treatment) HPI Comments: 81 yo hx of HTN, HLD, DMI, CKD, CAD, ischemic cardiomyopathy with chronic systolic failure, atrial fibrillation, presents with CC of fall, left thumb pain.  Pt states he usually walks with a walker.  Today he went to [place], and got out of car without walker and went inside.  He states he accidentally fell, catching himself with his hands.  Pt sustained skin tear to left thumb.  Pt denies head injury or LOC.  Pt states he fell 2/2 leg swelling.  He has a history of chronic lymphedema.  He states he has been noncompliant with Lasix because it makes him pee too often.  He has also not taken his antihypertensive medication in the last few days because it makes him feel faint. Denies increased orthopnea, SOB, chest pain, palpitations, or any other symptoms.  Of note pt with recent epistaxis from L nare 2/2, who underwent packing here in ED.  Pt was supposed to f/u for packing removal, but never did.  No other concerns.    Patient is a 81 y.o. male presenting with fall. The history is provided by the patient. No language interpreter was used.  Fall This is a new problem. The current episode started today. Episode frequency: once. The problem has been resolved. Pertinent negatives include no abdominal pain, chest pain, coughing, diaphoresis, fatigue, fever, headaches, nausea, neck pain, numbness, rash, vertigo, visual change, vomiting or weakness. Nothing aggravates the symptoms. He has tried nothing for the symptoms.    Past Medical History  Diagnosis Date  . Smoker   . Narcolepsy   . Hypertension   . BPH (benign prostatic hyperplasia)   . Arthritis   . Diabetes mellitus   . Neuropathy in diabetes   . Herpes   .  Dyslipidemia   . Obesity   . Lymphedema     LE lymphedemia with hx of cellulitis.  . CKD (chronic kidney disease)   . Anemia   . History of GI bleed 06/2012    EGD unremarkable;  Colo with large cecal polyp (bx benign)  . Ischemic cardiomyopathy     Echo 06/19/12: EF 40-45%, mid to distal anteroseptal HK, moderate LAE, PASP 37  . Chronic systolic heart failure   . CAD (coronary artery disease)     a. probable underlying CAD;  b. Type 2 NSTEMi in setting of profound anemia (Hgb 3.6) from GI bleed in 06/2012 and WMA on echo;  c. med Rx due to CKD and GI bleed  . CHF (congestive heart failure)   . Atrial fibrillation    Past Surgical History  Procedure Laterality Date  . Leg surgery      Left leg surgery. broken leg  . Esophagogastroduodenoscopy  06/19/2012    Procedure: ESOPHAGOGASTRODUODENOSCOPY (EGD);  Surgeon: Beryle Beams, MD;  Location: [facility] ENDOSCOPY;  Service: Endoscopy;  Laterality: N/A;  . Colonoscopy  06/21/2012    Procedure: COLONOSCOPY;  Surgeon: Juanita Craver, MD;  Location: [facility] ENDOSCOPY;  Service: Endoscopy;  Laterality: N/A;  . Colonoscopy Left 02/13/2014    Procedure: COLONOSCOPY;  Surgeon: Juanita Craver, MD;  Location: [place];  Service: Endoscopy;  Laterality: Left;  . Laparoscopic right colectomy Right 02/15/2014    Procedure: LAPAROSCOPIC ASISSTED RIGHT  COLECTOMY, POSSIBLE OPEN;  Surgeon: Rolm Bookbinder, MD;  [facility];  Service: General;  Laterality: Right;   Family History  Problem Relation Age of Onset  . Brain cancer Mother   . Diabetes type II Sister   . Narcolepsy Paternal Uncle    History  Substance Use Topics  . Smoking status: Former Smoker    Quit date: 08/26/1998  . Smokeless tobacco: Never Used  . Alcohol Use: No     Comment: in the past    Review of Systems  Constitutional: Negative for fever, diaphoresis and fatigue.  Respiratory: Negative for cough and shortness of breath.   Cardiovascular: Positive for leg swelling. Negative  for chest pain and palpitations.  Gastrointestinal: Negative for nausea, vomiting and abdominal pain.  Genitourinary: Negative for dysuria.  Musculoskeletal: Negative for neck pain.  Skin: Negative for rash.  Neurological: Negative for dizziness, vertigo, weakness, light-headedness, numbness and headaches.  Hematological: Negative for adenopathy. Does not bruise/bleed easily.      Allergies  Review of patient's allergies indicates no known allergies.  Home Medications   Prior to Admission medications   Medication Sig Start Date End Date Taking? Authorizing Provider  amLODipine (NORVASC) 10 MG tablet Take 10 mg by mouth daily.    Historical Provider, MD  carvedilol (COREG) 12.5 MG tablet Take 12.5 mg by mouth 2 (two) times daily with a meal.    Historical Provider, MD  furosemide (LASIX) 40 MG tablet Take 1 tablet (40 mg total) by mouth daily. 01/10/14   Denita Lung, MD   BP 207/75 mmHg  Pulse 60  Temp(Src) 97.6 F (36.4 C) (Oral)  Resp 15  Ht 5\' 10"  (1.778 m)  SpO2 100% Physical Exam  Constitutional: He is oriented to person, place, and time. He appears well-developed and well-nourished.  HENT:  Head: Normocephalic and atraumatic.  Right Ear: External ear normal.  Left Ear: External ear normal.  Mouth/Throat: Oropharynx is clear and moist.  merocel packing L nare.  Removed without residual bleeding.   Eyes: Conjunctivae and EOM are normal. Pupils are equal, round, and reactive to light.  Neck: Normal range of motion. Neck supple.  Cardiovascular: Normal rate, regular rhythm, normal heart sounds and intact distal pulses.   3-4+ pitting edema in bilateral lower extremities with chronic stasis dermatitis.    Pulmonary/Chest: Effort normal and breath sounds normal. No respiratory distress. He has no wheezes. He has no rales. He exhibits no tenderness.  Abdominal: Soft. Bowel sounds are normal. He exhibits no distension and no mass. There is no tenderness. There is no rebound  and no guarding.  Musculoskeletal: He exhibits tenderness.  Mild TTP to left thumb, no deformity palpated.   Neurological: He is alert and oriented to person, place, and time.  Skin: Skin is warm.  Small skin tear to L thumb.  No laceration.    Nursing note and vitals reviewed.   ED Course  Procedures (including critical care time) Labs Review Labs Reviewed  CBC WITH DIFFERENTIAL/PLATELET - Abnormal; Notable for the following:    WBC 3.7 (*)    RBC 3.89 (*)    Hemoglobin 8.9 (*)    HCT 29.6 (*)    MCV 76.1 (*)    MCH 22.9 (*)    RDW 15.9 (*)    Platelets 149 (*)    Monocytes Relative 28 (*)    All other components within normal limits  BASIC METABOLIC PANEL - Abnormal; Notable for the following:    Glucose, Bld 133 (*)  Creatinine, Ser 1.59 (*)    GFR calc non Af Amer 40 (*)    GFR calc Af Amer 46 (*)    All other components within normal limits  BRAIN NATRIURETIC PEPTIDE - Abnormal; Notable for the following:    B Natriuretic Peptide 599.1 (*)    All other components within normal limits  PROTIME-INR    Imaging Review Dg Hand 2 View Left  10/20/2014   CLINICAL DATA:  Laceration anterior LEFT thumb  EXAM: LEFT HAND - 2 VIEW  COMPARISON:  None.  FINDINGS: Dressing artifacts at thumb.  Bones demineralized.  Minimal degenerative changes at first [facility] joint.  Small degenerative cyst at lunate.  No acute fracture, dislocation or additional bone destruction.  No radiopaque foreign bodies identified.  IMPRESSION: No acute osseous abnormalities.   Electronically Signed   By: Lavonia Dana M.D.   On: 10/20/2014 16:43     EKG Interpretation   Date/Time:  Thursday October 20 2014 17:03:09 EST Ventricular Rate:  78 PR Interval:  217 QRS Duration: 87 QT Interval:  422 QTC Calculation: 481 R Axis:   8 Text Interpretation:  Second degree AV block, Mobitz II Aberrant  conduction of SV complex(es) Borderline T wave abnormalities Borderline  prolonged QT interval No significant  change since last tracing Confirmed  by Winfred Leeds  MD, SAM [phone]) on 10/20/2014 5:10:42 PM      MDM   Final diagnoses:  Fall, initial encounter  Skin tear of hand without complication, left, initial encounter  Chronic acquired lymphedema  H/O medication noncompliance   81 yo hx of HTN, HLD, DMI, CKD, CAD, ischemic cardiomyopathy with chronic systolic failure, atrial fibrillation, presents with CC of fall, left thumb pain.  Physical exam as above.  Pt with mechanical fall, small skin tear to left thumb which does not require sutures.  XR of this hand was unremarkable for acute injury.  Pt with Merocel packing to left nare from a few weeks ago.  This was removed without residual bleeding.    Pt with 3-4+ pitting edema which is chronic in nature, pt noncompliant with home medications.  He is hypertensive to 212/13 on arrival, without CP, SOB, headache, vision change, decreased UOP.  Labs demonstrate anemia which is baseline, elevated Cr which is baseline.  BNP ordered from triage which is slightly elevated, but on exam pt does not appear to be in acute CHF as not SOB, lungs sounds WNL, no oxygen requirement.   Pt given lasix 40 mg IV for lymphedema, and advised to take home medications as prescribed.  Pt describes poor living conditions, and care management involved and have arranged for home health services as outpatient.    Pt okay for d/c home at this time.   Diagnosis fall, skin tear, chronic lymphedema with medication noncompliance. F/u with PCP in 1 week.  Strict return precautions given.  Pt understands and agrees with plan.   Sinda Du  Discussed pt with attending Dr. Winfred Leeds.      Sinda Du, MD 10/21/14 [id number]  Orlie Dakin, MD 10/22/14 [phone]

## 2014-10-20 NOTE — Progress Notes (Signed)
ED CM consulted by Dr. Winfred Leeds concerning possible disposition plan once medically cleared. Patient presented to [facility] ED s/p fall  At [place]. Patient known to this ED CM, Met with patient at bedside patient reports that he was fine before he started having his leg swelling a couple of years ago.  Discussed his follow up with PCP, he states, he now sees Dr Jeanie Cooks on [address], Patient states, that he has not been taking his heart meds, because it makes him sick. Explained that he needs to follow up with PCP to make medication adjustments as needed. Discussed recommendations for [facility] services RN, PT, and SW, patient and family agreeable with plan. Offered choice, [facility] selected.  Also discussed [facility] with patient he may benefit from the services. Denies the need for any DME at present. Verified contact information in record with patient and family and explained that a Representative from [place] will contact patient and family by phone within 24-48 hours. Patient verbalized understanding and teach back done. Referral faxed to [facility] [phone], received fax confirmation. Updated Dr. Winfred Leeds on disposition plan he is agreeable. Patient being discharged home with family in private vehicle. No further ED CM needs identified

## 2014-11-02 ENCOUNTER — Encounter (HOSPITAL_COMMUNITY): Payer: Self-pay | Admitting: Emergency Medicine

## 2014-11-02 ENCOUNTER — Emergency Department (HOSPITAL_COMMUNITY)
Admission: EM | Admit: 2014-11-02 | Discharge: 2014-11-02 | Disposition: A | Discharge status: Home / Self Care | Payer: Medicare Other | Attending: Emergency Medicine | Admitting: Emergency Medicine

## 2014-11-02 DIAGNOSIS — I5022 Chronic systolic (congestive) heart failure: Secondary | ICD-10-CM | POA: Insufficient documentation

## 2014-11-02 DIAGNOSIS — Z8669 Personal history of other diseases of the nervous system and sense organs: Secondary | ICD-10-CM | POA: Diagnosis not present

## 2014-11-02 DIAGNOSIS — N189 Chronic kidney disease, unspecified: Secondary | ICD-10-CM | POA: Diagnosis not present

## 2014-11-02 DIAGNOSIS — Z862 Personal history of diseases of the blood and blood-forming organs and certain disorders involving the immune mechanism: Secondary | ICD-10-CM | POA: Insufficient documentation

## 2014-11-02 DIAGNOSIS — Z8619 Personal history of other infectious and parasitic diseases: Secondary | ICD-10-CM | POA: Insufficient documentation

## 2014-11-02 DIAGNOSIS — E669 Obesity, unspecified: Secondary | ICD-10-CM | POA: Insufficient documentation

## 2014-11-02 DIAGNOSIS — Z79899 Other long term (current) drug therapy: Secondary | ICD-10-CM | POA: Diagnosis not present

## 2014-11-02 DIAGNOSIS — I2510 Atherosclerotic heart disease of native coronary artery without angina pectoris: Secondary | ICD-10-CM | POA: Insufficient documentation

## 2014-11-02 DIAGNOSIS — R2243 Localized swelling, mass and lump, lower limb, bilateral: Secondary | ICD-10-CM | POA: Diagnosis present

## 2014-11-02 DIAGNOSIS — I129 Hypertensive chronic kidney disease with stage 1 through stage 4 chronic kidney disease, or unspecified chronic kidney disease: Secondary | ICD-10-CM | POA: Insufficient documentation

## 2014-11-02 DIAGNOSIS — I890 Lymphedema, not elsewhere classified: Secondary | ICD-10-CM | POA: Diagnosis not present

## 2014-11-02 DIAGNOSIS — Z8739 Personal history of other diseases of the musculoskeletal system and connective tissue: Secondary | ICD-10-CM | POA: Insufficient documentation

## 2014-11-02 DIAGNOSIS — E1140 Type 2 diabetes mellitus with diabetic neuropathy, unspecified: Secondary | ICD-10-CM | POA: Insufficient documentation

## 2014-11-02 DIAGNOSIS — Z8719 Personal history of other diseases of the digestive system: Secondary | ICD-10-CM | POA: Diagnosis not present

## 2014-11-02 DIAGNOSIS — Z87891 Personal history of nicotine dependence: Secondary | ICD-10-CM | POA: Insufficient documentation

## 2014-11-02 DIAGNOSIS — I10 Essential (primary) hypertension: Secondary | ICD-10-CM

## 2014-11-02 LAB — BASIC METABOLIC PANEL
Anion gap: 6 (ref 5–15)
BUN: 22 mg/dL (ref 6–23)
CO2: 24 mmol/L (ref 19–32)
Calcium: 8.1 mg/dL — ABNORMAL LOW (ref 8.4–10.5)
Chloride: 108 mmol/L (ref 96–112)
Creatinine, Ser: 1.75 mg/dL — ABNORMAL HIGH (ref 0.50–1.35)
GFR calc Af Amer: 41 mL/min — ABNORMAL LOW (ref >90)
GFR calc non Af Amer: 36 mL/min — ABNORMAL LOW (ref >90)
GLUCOSE: 220 mg/dL — AB (ref 70–99)
POTASSIUM: 3.6 mmol/L (ref 3.5–5.1)
SODIUM: 138 mmol/L (ref 135–145)

## 2014-11-02 LAB — CBC WITH DIFFERENTIAL/PLATELET
BASOS PCT: 0 % (ref 0–1)
Basophils Absolute: 0 10*3/uL (ref 0.0–0.1)
Eosinophils Absolute: 0.1 10*3/uL (ref 0.0–0.7)
Eosinophils Relative: 1 % (ref 0–5)
HCT: 28.5 % — ABNORMAL LOW (ref 39.0–52.0)
Hemoglobin: 8.5 g/dL — ABNORMAL LOW (ref 13.0–17.0)
LYMPHS ABS: 0.9 10*3/uL (ref 0.7–4.0)
LYMPHS PCT: 22 % (ref 12–46)
MCH: 23.2 pg — ABNORMAL LOW (ref 26.0–34.0)
MCHC: 29.8 g/dL — ABNORMAL LOW (ref 30.0–36.0)
MCV: 77.7 fL — AB (ref 78.0–100.0)
MONO ABS: 1.1 10*3/uL — AB (ref 0.1–1.0)
Monocytes Relative: 24 % — ABNORMAL HIGH (ref 3–12)
NEUTROS ABS: 2.3 10*3/uL (ref 1.7–7.7)
NEUTROS PCT: 53 % (ref 43–77)
PLATELETS: 121 10*3/uL — AB (ref 150–400)
RBC: 3.67 MIL/uL — AB (ref 4.22–5.81)
RDW: 15.9 % — ABNORMAL HIGH (ref 11.5–15.5)
WBC: 4.4 10*3/uL (ref 4.0–10.5)

## 2014-11-02 MED ORDER — FUROSEMIDE 10 MG/ML IJ SOLN: 40.0000 mg | Freq: Once | INTRAMUSCULAR | Status: DC

## 2014-11-02 MED ORDER — FUROSEMIDE 40 MG PO TABS
40.0000 mg | ORAL_TABLET | Freq: Once | ORAL | Status: AC
  Administered 2014-11-02: 40 mg via ORAL
  Filled 2014-11-02: qty 1

## 2014-11-02 NOTE — Progress Notes (Signed)
EDCM went to speak to patient at bedside however, patient was being taken to the bathroom and then being discharged.  [facility] asked patient if he is being followed by [facility] home health services?  Patient responded, "Yes, but they didn't come out today."  EDCM called and spoke to patient's daughter Sondra Barges who reports she is picking patient up and also confirms the patient is receiving services with [place].  No further EDCM needs at this time.

## 2014-11-02 NOTE — ED Provider Notes (Signed)
CSN: [id number]     Arrival date & time 11/02/14  1603 History   First MD Initiated Contact with Patient 11/02/14 1608     Chief Complaint  Patient presents with  . Leg Swelling     (Consider location/radiation/quality/duration/timing/severity/associated sxs/prior Treatment) HPI Comments: Patient with a history of lymphedema presents with leg swelling. He states that the swelling is been there about 2 months and is unchanged from his prior 2 month history of the leg swelling. He's had no pain to his legs. He denies any redness or fevers. He's ambulating without too much difficulty. He denies any shortness of breath. He has not followed up with his primary care physician for the swelling. He takes Lasix at home. He states he called over to his doctor's office and was told to come here for an evaluation. He states he's been taking his medications regularly. His blood pressure is noted to be elevated but he states it's always this high. He states he's been taking his medication as directed. He denies any headache dizziness chest pain or shortness of breath.   Past Medical History  Diagnosis Date  . Smoker   . Narcolepsy   . Hypertension   . BPH (benign prostatic hyperplasia)   . Arthritis   . Diabetes mellitus   . Neuropathy in diabetes   . Herpes   . Dyslipidemia   . Obesity   . Lymphedema     LE lymphedemia with hx of cellulitis.  . CKD (chronic kidney disease)   . Anemia   . History of GI bleed 06/2012    EGD unremarkable;  Colo with large cecal polyp (bx benign)  . Ischemic cardiomyopathy     Echo 06/19/12: EF 40-45%, mid to distal anteroseptal HK, moderate LAE, PASP 37  . Chronic systolic heart failure   . CAD (coronary artery disease)     a. probable underlying CAD;  b. Type 2 NSTEMi in setting of profound anemia (Hgb 3.6) from GI bleed in 06/2012 and WMA on echo;  c. med Rx due to CKD and GI bleed  . CHF (congestive heart failure)   . Atrial fibrillation    Past Surgical  History  Procedure Laterality Date  . Leg surgery      Left leg surgery. broken leg  . Esophagogastroduodenoscopy  06/19/2012    Procedure: ESOPHAGOGASTRODUODENOSCOPY (EGD);  Surgeon: Beryle Beams, MD;  Location[facility] END[facility]COPY;  Service: Endoscopy;  Laterality: N/A;  . Colonoscopy  06/21/2012    Procedure: COLONOSCOPY;  Surgeon: Juanita Craver, MD;  Location: [facility] ENDOSCOPY;  Service: Endoscopy;  Laterality: N/A;  . Colonoscopy Left 02/13/2014    Procedure: COLONOSCOPY;  Surgeon: Juanita Craver, MD;  Location: [place];  Service: Endoscopy;  Laterality: Left;  . Laparoscopic right colectomy Right 02/15/2014    Procedure: LAPAROSCOPIC ASISSTED RIGHT  COLECTOMY, POSSIBLE OPEN;  Surgeon: Rolm Bookbinder, MD;  Location: [place];  Service: General;  Laterality: Right;   Family History  Problem Relation Age of Onset  . Brain cancer Mother   . Diabetes type II Sister   . Narcolepsy Paternal Uncle    History  Substance Use Topics  . Smoking status: Former Smoker    Quit date: 08/26/1998  . Smokeless tobacco: Never Used  . Alcohol Use: No     Comment: in the past    Review of Systems  Constitutional: Negative for fever, chills, diaphoresis and fatigue.  HENT: Negative for congestion, rhinorrhea and sneezing.   Eyes: Negative.   Respiratory:  Negative for cough, chest tightness and shortness of breath.   Cardiovascular: Positive for leg swelling. Negative for chest pain.  Gastrointestinal: Negative for nausea, vomiting, abdominal pain, diarrhea and blood in stool.  Genitourinary: Negative for frequency, hematuria, flank pain and difficulty urinating.  Musculoskeletal: Negative for back pain and arthralgias.  Skin: Negative for rash.  Neurological: Negative for dizziness, speech difficulty, weakness, numbness and headaches.      Allergies  Review of patient's allergies indicates no known allergies.  Home Medications   Prior to Admission medications   Medication Sig Start Date End Date  Taking? Authorizing Provider  amLODipine (NORVASC) 10 MG tablet Take 10 mg by mouth daily.   Yes Historical Provider, MD  furosemide (LASIX) 40 MG tablet Take 1 tablet (40 mg total) by mouth daily. 01/10/14  Yes Denita Lung, MD   BP 230/91 mmHg  Pulse 73  Temp(Src) 98.6 F (37 C) (Oral)  Resp 23  Ht 5\' 11"  (1.803 m)  Wt 252 lb (114.306 kg)  BMI 35.16 kg/m2  SpO2 99% Physical Exam  Constitutional: He is oriented to person, place, and time. He appears well-developed and well-nourished.  HENT:  Head: Normocephalic and atraumatic.  Eyes: Pupils are equal, round, and reactive to light.  Neck: Normal range of motion. Neck supple.  Cardiovascular: Normal rate, regular rhythm and normal heart sounds.   Pulmonary/Chest: Effort normal and breath sounds normal. No respiratory distress. He has no wheezes. He has no rales. He exhibits no tenderness.  Abdominal: Soft. Bowel sounds are normal. There is no tenderness. There is no rebound and no guarding.  Musculoskeletal: Normal range of motion. He exhibits edema (patient 3+ pitting edema bilaterally. There is no warmth or erythema. He has scaliness to both lower extremities consistent with chronic lymphedema. Pedal pulses are difficult to palpate given the swelling but he has normal coloration and temperature to th).  Lymphadenopathy:    He has no cervical adenopathy.  Neurological: He is alert and oriented to person, place, and time.  Skin: Skin is warm and dry. No rash noted.  Psychiatric: He has a normal mood and affect.    ED Course  Procedures (including critical care time) Labs Review Labs Reviewed  BASIC METABOLIC PANEL - Abnormal; Notable for the following:    Glucose, Bld 220 (*)    Creatinine, Ser 1.75 (*)    Calcium 8.1 (*)    GFR calc non Af Amer 36 (*)    GFR calc Af Amer 41 (*)    All other components within normal limits  CBC WITH DIFFERENTIAL/PLATELET - Abnormal; Notable for the following:    RBC 3.67 (*)    Hemoglobin  8.5 (*)    HCT 28.5 (*)    MCV 77.7 (*)    MCH 23.2 (*)    MCHC 29.8 (*)    RDW 15.9 (*)    Platelets 121 (*)    Monocytes Relative 24 (*)    Monocytes Absolute 1.1 (*)    All other components within normal limits    Imaging Review No results found.   EKG Interpretation   Date/Time:  Wednesday November 02 2014 16:19:14 EST Ventricular Rate:  85 PR Interval:  183 QRS Duration: 91 QT Interval:  475 QTC Calculation: 565 R Axis:   -9 Text Interpretation:  Sinus or ectopic atrial rhythm Atrial premature  complex Sinus pause Borderline repolarization abnormality Prolonged QT  interval since last tracing no significant change Confirmed by Keylor Rands  MD,  Chizaram Latino ([id number])  on 11/02/2014 7:38:41 PM      MDM   Final diagnoses:  Lymphedema  Essential hypertension    Patient presents with leg swelling and it's unchanged over the last 2 months. He doesn't have any new symptoms. There is no evidence of cellulitis. He denies any increased pain to the areas. There is nothing to be more suggestive of DVT. He has no respiratory symptoms or signs of congestive heart failure. He was given extra dose of Lasix in the ED. He refused IV Lasix and was given a dose of by mouth Lasix. His creatinine is slightly elevated but not significantly above his baseline. I encouraged him to have close follow-up with his primary care physician. I advised him that his blood pressure needs to be better managed but he got very upset and says his blood pressures has been this high since he was a young man. He initially did not want to go back to stay with his daughter who is staying with. He initially said he wanted to look into staying in an assisted living type facility. I had social worker come and talk to mom and now he changes his mind and says that he doesn't want him going to any kind of nursing home type setting even if it is independent living.    Malvin Johns, MD 11/02/14 [phone]

## 2014-11-02 NOTE — ED Notes (Signed)
Per EMS bilateral leg swelling for 2 months. Called PCP today and was told to go to ER. Pt has hx of HTN and CHF.

## 2014-11-02 NOTE — ED Notes (Signed)
Bed: WA01 Expected date:  Expected time:  Means of arrival:  Comments: 81 y/o M leg swelling

## 2014-11-02 NOTE — Progress Notes (Signed)
CSW was consulted by physician to speak with pt due to living situation.   CSW met with pt at bedside. There was no family present. Pt states that he presents to [facility] due to leg swelling.  Pt informed CSW that he currently lives with his daughter. Patient informed CSW that he has been living with his daughter for the past 8 months. However, he states that he would like to move due to unfit living conditions. Pt states that he lives at [facility] in [place]. Patient states that his daughter, and her boyfriend reside in the home. Also, he states that his daughter's, boyfriends grandchild is often in the home.   Pt informed CSW that he sleeps on the couch. Pt states that this is a 2 bedroom apartment. However, he states he cannot sleep in the extra bedroom due to it being full of junk.   He says the house is infested with bedbugs, flees, and roaches. Also, pt informed CSW that he feels as if his daughter is mismanaging his money. He states that she gives the money to her boyfriend. Pt informed CSW that the daughter does not steal from him. However, he states that he helps with a lot in the household.   Pt states that he needs assistance with all of his ADL's, but currently does not have any help. Pt informed CSW that he does not feel as though he has a support system. Pt stated " I don't have any family support whatsoever." Pt informed CSW that he was was in the hospital a week ago due to falling.  CSW asked pt would he be interested in a facility. However. Pt stated he is not interested in a facility. Pt states he would not even want to be admitted into an independent unit. Pt stated "No, Im not going to a nursing home period! I have already been that route before and it does not work like they say it works."  CSW explained that there are facilities who offer independent living, which allow for more freedom than other units. However, the pt appears to be self determined in the fact that a  facility is not an option. Pt stated that he would like to stay in a regular apartment.  Pt stated that his leg is in pain. CSW made nurse aware.  CSW will file APS report. Pt is aware that CSW will file APS report.  Willette Brace [phone] ED CSW 11/02/2014 7:22 PM

## 2014-11-02 NOTE — Discharge Instructions (Signed)
Lymphedema Lymphedema is a swelling caused by the abnormal collection of lymph under the skin. The lymph is fluid from the tissues in your body that travels in the lymphatic system. This system is part of the immune system that includes lymph nodes and vessels. The lymph vessels collect and carry the excess fluid, fats, proteins, and wastes from the tissues of the body to the bloodstream. This system also works to clean and remove bacteria and waste products from the body.  Lymphedema occurs when the lymphatic system is blocked. When the lymph vessels or lymph nodes are blocked or damaged, lymph does not drain properly. This causes abnormal build up of lymph. This leads to swelling in the arms or legs. Lymphedema cannot be cured by medicines. But the swelling can be reduced by physical methods. CAUSES  There are two types of lymphedema. Primary lymphedema is caused by the absence or abnormality of the lymph vessel at birth. It is also known as inherited lymphedema, which occurs rarely. Secondary or acquired lymphedema occurs when the lymph vessel is damaged or blocked. The causes of lymph vessel blockage are:   Skin infection like cellulites.  Infection by parasites (filariasis).  Injury.  Cancer.  Radiation therapy.  Formation of scar tissue.  Surgery. SYMPTOMS  The symptoms of lymphedema are:  Abnormal swelling of the arm or leg.  Heavy or tight feeling in your arm or leg.  Tight-fitting shoes or rings.  Redness of skin over the affected area.  Limited movement of the affected limb.  Some patients complain about sensitivity to touch and discomfort in the limb(s) affected. You may not have these symptoms immediately following injury. They usually appear within a few days or even years after injury. Inform your caregiver, if you have any of these symptoms. Early treatment can avoid further problems.  DIAGNOSIS  First, your caregiver will inquire about any surgery you have had or  medicines you are taking. He will then examine you. Your caregiver may order special imaging tests, such as:  Lymphoscintigraphy (a test in which a low dose of radioactive substance is injected to trace the flow of lymph through the lymph vessels).  MRI (imaging tests using magnetic fields).  Computed tomography (test using special cross-sectional X-rays).  Duplex ultrasound (test using high-frequency sound waves to show the vessels and the blood flow on a screen).  Lymphangiography (special X-ray taken after injecting a contrast dye into the lymph vessel). It is now rarely done. TREATMENT  Lymphedema can be treated in different ways. Your caregiver will decide the type of treatment depending on the cause. Treatment may include:  Exercise: Special exercises will help fluid move out easily from the affected part. This should be done as per your caregiver's advice.  Manual lymph drainage: Gentle massage of the affected limb makes the fluid to move out more freely.  Compression: Compression stockings or external pump apply pressure over the affected limb. This helps the fluid to move out from the arm or leg. Bandaging can also help to move the fluid out from the affected part. Your caregiver will decide the method that suits you the best.  Medicines: Your caregiver may prescribe antibiotics, if you have infection.  Surgery: Your caregiver may advise surgery for severe lymphedema. It is reserved for special cases when the patient has difficulty moving. Your surgeon may remove excess tissue from the arm or leg. This will help to ease your movement. Physical therapy may have to be continued after surgery. HOME CARE INSTRUCTIONS    The area is very fragile and is predisposed to injury and infection.  Eat a healthy diet.  Exercise regularly as per advice.  Keep the affected area clean and dry.  Use gloves while cooking or gardening.  Protect your skin from cuts.  Use electric razor to  shave the affected area.  Keep affected limb elevated.  Do not wear tight clothes, shoes, or jewelry as it may cause the tissue to be strangled.  Do not use heat pads over the affected area.  Do not sit with cross legs.  Do not walk barefoot.  Do not carry weight on the affected arm.  Avoid having blood pressure checked on the affected limb. SEEK MEDICAL CARE IF:  You continue to have swelling in your limb. SEEK IMMEDIATE MEDICAL CARE IF:   You have high fever.  You have skin rash.  You have chills or sweats.  You have pain or redness.  You have a cut that does not heal. MAKE SURE YOU:   Understand these instructions.  Will watch your condition.  Will get help right away if you are not doing well or get worse. Document Released: 06/09/2007 Document Revised: 07/29/2012 Document Reviewed: 05/15/2009 ExitCare Patient Information 2015 [employer]. This information is not intended to replace advice given to you by your health care provider. Make sure you discuss any questions you have with your health care provider.  

## 2014-11-14 ENCOUNTER — Ambulatory Visit (INDEPENDENT_AMBULATORY_CARE_PROVIDER_SITE_OTHER): Payer: Medicare Other | Admitting: Podiatry

## 2014-11-14 DIAGNOSIS — M79676 Pain in unspecified toe(s): Secondary | ICD-10-CM

## 2014-11-14 DIAGNOSIS — B351 Tinea unguium: Secondary | ICD-10-CM

## 2014-11-14 NOTE — Patient Instructions (Signed)
Diabetes and Foot Care Diabetes may cause you to have problems because of poor blood supply (circulation) to your feet and legs. This may cause the skin on your feet to become thinner, break easier, and heal more slowly. Your skin may become dry, and the skin may peel and crack. You may also have nerve damage in your legs and feet causing decreased feeling in them. You may not notice minor injuries to your feet that could lead to infections or more serious problems. Taking care of your feet is one of the most important things you can do for yourself.  HOME CARE INSTRUCTIONS  Wear shoes at all times, even in the house. Do not go barefoot. Bare feet are easily injured.  Check your feet daily for blisters, cuts, and redness. If you cannot see the bottom of your feet, use a mirror or ask someone for help.  Wash your feet with warm water (do not use hot water) and mild soap. Then pat your feet and the areas between your toes until they are completely dry. Do not soak your feet as this can dry your skin.  Apply a moisturizing lotion or petroleum jelly (that does not contain alcohol and is unscented) to the skin on your feet and to dry, brittle toenails. Do not apply lotion between your toes.  Trim your toenails straight across. Do not dig under them or around the cuticle. File the edges of your nails with an emery board or nail file.  Do not cut corns or calluses or try to remove them with medicine.  Wear clean socks or stockings every day. Make sure they are not too tight. Do not wear knee-high stockings since they may decrease blood flow to your legs.  Wear shoes that fit properly and have enough cushioning. To break in new shoes, wear them for just a few hours a day. This prevents you from injuring your feet. Always look in your shoes before you put them on to be sure there are no objects inside.  Do not cross your legs. This may decrease the blood flow to your feet.  If you find a minor scrape,  cut, or break in the skin on your feet, keep it and the skin around it clean and dry. These areas may be cleansed with mild soap and water. Do not cleanse the area with peroxide, alcohol, or iodine.  When you remove an adhesive bandage, be sure not to damage the skin around it.  If you have a wound, look at it several times a day to make sure it is healing.  Do not use heating pads or hot water bottles. They may burn your skin. If you have lost feeling in your feet or legs, you may not know it is happening until it is too late.  Make sure your health care provider performs a complete foot exam at least annually or more often if you have foot problems. Report any cuts, sores, or bruises to your health care provider immediately. SEEK MEDICAL CARE IF:   You have an injury that is not healing.  You have cuts or breaks in the skin.  You have an ingrown nail.  You notice redness on your legs or feet.  You feel burning or tingling in your legs or feet.  You have pain or cramps in your legs and feet.  Your legs or feet are numb.  Your feet always feel cold. SEEK IMMEDIATE MEDICAL CARE IF:   There is increasing redness,   swelling, or pain in or around a wound.  There is a red line that goes up your leg.  Pus is coming from a wound.  You develop a fever or as directed by your health care provider.  You notice a bad smell coming from an ulcer or wound. Document Released: 08/09/2000 Document Revised: 04/14/2013 Document Reviewed: 01/19/2013 Exit[employer] Patient Information 2015 [employer]. This information is not intended to replace advice given to you by your health care provider. Make sure you discuss any questions you have with your health care provider.  

## 2014-11-15 NOTE — Progress Notes (Signed)
Patient ID: [name], male   DOB: May 16, 1934, 81 y.o.   MRN: [id number]  Subjective: This patient presents again complaining of painful toenails and requesting nail debridement  Objective: The toenails are elongated, hypertrophic, discolored, hypertrophied, and tender to direct palpation 6-10  Assessment: Symptomatic onychomycoses 6-10 Type II diabetic  Plan: Debrided toenails 10 without any bleeding  Reappoint 3 months

## 2014-11-23 ENCOUNTER — Inpatient Hospital Stay (HOSPITAL_COMMUNITY)
Admission: EM | Admit: 2014-11-23 | Discharge: 2014-11-29 | DRG: 292 | Disposition: A | Discharge status: Skilled Nursing Facility | Payer: Medicare Other | Attending: Internal Medicine | Admitting: Internal Medicine

## 2014-11-23 ENCOUNTER — Encounter (HOSPITAL_COMMUNITY): Payer: Self-pay | Admitting: Emergency Medicine

## 2014-11-23 ENCOUNTER — Other Ambulatory Visit (HOSPITAL_COMMUNITY): Payer: Self-pay

## 2014-11-23 ENCOUNTER — Emergency Department (HOSPITAL_COMMUNITY): Payer: Medicare Other

## 2014-11-23 DIAGNOSIS — N183 Chronic kidney disease, stage 3 unspecified: Secondary | ICD-10-CM | POA: Diagnosis present

## 2014-11-23 DIAGNOSIS — I255 Ischemic cardiomyopathy: Secondary | ICD-10-CM | POA: Diagnosis present

## 2014-11-23 DIAGNOSIS — G47419 Narcolepsy without cataplexy: Secondary | ICD-10-CM | POA: Diagnosis present

## 2014-11-23 DIAGNOSIS — E1121 Type 2 diabetes mellitus with diabetic nephropathy: Secondary | ICD-10-CM | POA: Diagnosis present

## 2014-11-23 DIAGNOSIS — E785 Hyperlipidemia, unspecified: Secondary | ICD-10-CM | POA: Diagnosis present

## 2014-11-23 DIAGNOSIS — Z87891 Personal history of nicotine dependence: Secondary | ICD-10-CM

## 2014-11-23 DIAGNOSIS — R140 Abdominal distension (gaseous): Secondary | ICD-10-CM | POA: Diagnosis not present

## 2014-11-23 DIAGNOSIS — Z9114 Patient's other noncompliance with medication regimen: Secondary | ICD-10-CM | POA: Diagnosis present

## 2014-11-23 DIAGNOSIS — E1165 Type 2 diabetes mellitus with hyperglycemia: Secondary | ICD-10-CM | POA: Diagnosis present

## 2014-11-23 DIAGNOSIS — R600 Localized edema: Secondary | ICD-10-CM | POA: Diagnosis not present

## 2014-11-23 DIAGNOSIS — Z9111 Patient's noncompliance with dietary regimen: Secondary | ICD-10-CM | POA: Diagnosis present

## 2014-11-23 DIAGNOSIS — D509 Iron deficiency anemia, unspecified: Secondary | ICD-10-CM | POA: Diagnosis present

## 2014-11-23 DIAGNOSIS — I5031 Acute diastolic (congestive) heart failure: Secondary | ICD-10-CM | POA: Diagnosis not present

## 2014-11-23 DIAGNOSIS — I129 Hypertensive chronic kidney disease with stage 1 through stage 4 chronic kidney disease, or unspecified chronic kidney disease: Secondary | ICD-10-CM | POA: Diagnosis present

## 2014-11-23 DIAGNOSIS — E669 Obesity, unspecified: Secondary | ICD-10-CM | POA: Diagnosis present

## 2014-11-23 DIAGNOSIS — E46 Unspecified protein-calorie malnutrition: Secondary | ICD-10-CM | POA: Diagnosis present

## 2014-11-23 DIAGNOSIS — I890 Lymphedema, not elsewhere classified: Secondary | ICD-10-CM | POA: Diagnosis present

## 2014-11-23 DIAGNOSIS — R0600 Dyspnea, unspecified: Secondary | ICD-10-CM | POA: Diagnosis not present

## 2014-11-23 DIAGNOSIS — I2510 Atherosclerotic heart disease of native coronary artery without angina pectoris: Secondary | ICD-10-CM | POA: Diagnosis present

## 2014-11-23 DIAGNOSIS — N400 Enlarged prostate without lower urinary tract symptoms: Secondary | ICD-10-CM | POA: Diagnosis present

## 2014-11-23 DIAGNOSIS — I5043 Acute on chronic combined systolic (congestive) and diastolic (congestive) heart failure: Secondary | ICD-10-CM | POA: Diagnosis present

## 2014-11-23 DIAGNOSIS — E1122 Type 2 diabetes mellitus with diabetic chronic kidney disease: Secondary | ICD-10-CM | POA: Diagnosis present

## 2014-11-23 DIAGNOSIS — I5033 Acute on chronic diastolic (congestive) heart failure: Secondary | ICD-10-CM

## 2014-11-23 DIAGNOSIS — Z6833 Body mass index (BMI) 33.0-33.9, adult: Secondary | ICD-10-CM

## 2014-11-23 DIAGNOSIS — Z79899 Other long term (current) drug therapy: Secondary | ICD-10-CM

## 2014-11-23 DIAGNOSIS — Z9049 Acquired absence of other specified parts of digestive tract: Secondary | ICD-10-CM | POA: Diagnosis present

## 2014-11-23 DIAGNOSIS — B009 Herpesviral infection, unspecified: Secondary | ICD-10-CM | POA: Diagnosis present

## 2014-11-23 DIAGNOSIS — E1140 Type 2 diabetes mellitus with diabetic neuropathy, unspecified: Secondary | ICD-10-CM | POA: Diagnosis present

## 2014-11-23 DIAGNOSIS — R188 Other ascites: Secondary | ICD-10-CM

## 2014-11-23 DIAGNOSIS — I4891 Unspecified atrial fibrillation: Secondary | ICD-10-CM | POA: Diagnosis present

## 2014-11-23 DIAGNOSIS — M1990 Unspecified osteoarthritis, unspecified site: Secondary | ICD-10-CM | POA: Diagnosis present

## 2014-11-23 DIAGNOSIS — N179 Acute kidney failure, unspecified: Secondary | ICD-10-CM | POA: Diagnosis present

## 2014-11-23 DIAGNOSIS — Z85038 Personal history of other malignant neoplasm of large intestine: Secondary | ICD-10-CM | POA: Diagnosis not present

## 2014-11-23 DIAGNOSIS — E1322 Other specified diabetes mellitus with diabetic chronic kidney disease: Secondary | ICD-10-CM | POA: Diagnosis not present

## 2014-11-23 LAB — BASIC METABOLIC PANEL
ANION GAP: 10 (ref 5–15)
BUN: 27 mg/dL — AB (ref 6–23)
CO2: 24 mmol/L (ref 19–32)
CREATININE: 1.81 mg/dL — AB (ref 0.50–1.35)
Calcium: 7.9 mg/dL — ABNORMAL LOW (ref 8.4–10.5)
Chloride: 103 mmol/L (ref 96–112)
GFR calc non Af Amer: 34 mL/min — ABNORMAL LOW (ref >90)
GFR, EST AFRICAN AMERICAN: 40 mL/min — AB (ref >90)
Glucose, Bld: 197 mg/dL — ABNORMAL HIGH (ref 70–99)
POTASSIUM: 4.3 mmol/L (ref 3.5–5.1)
Sodium: 137 mmol/L (ref 135–145)

## 2014-11-23 LAB — HEPATIC FUNCTION PANEL
ALBUMIN: 3.2 g/dL — AB (ref 3.5–5.2)
ALT: 10 U/L (ref 0–53)
AST: 9 U/L (ref 0–37)
Alkaline Phosphatase: 67 U/L (ref 39–117)
Bilirubin, Direct: <0.1 mg/dL (ref 0.0–0.5)
Total Bilirubin: 0.6 mg/dL (ref 0.3–1.2)
Total Protein: 7 g/dL (ref 6.0–8.3)

## 2014-11-23 LAB — I-STAT CHEM 8, ED
BUN: 28 mg/dL — AB (ref 6–23)
CALCIUM ION: 1.06 mmol/L — AB (ref 1.13–1.30)
CHLORIDE: 101 mmol/L (ref 96–112)
Creatinine, Ser: 1.9 mg/dL — ABNORMAL HIGH (ref 0.50–1.35)
GLUCOSE: 200 mg/dL — AB (ref 70–99)
HCT: 28 % — ABNORMAL LOW (ref 39.0–52.0)
Hemoglobin: 9.5 g/dL — ABNORMAL LOW (ref 13.0–17.0)
Potassium: 4.4 mmol/L (ref 3.5–5.1)
Sodium: 140 mmol/L (ref 135–145)
TCO2: 22 mmol/L (ref 0–100)

## 2014-11-23 LAB — CBC WITH DIFFERENTIAL/PLATELET
BASOS ABS: 0 10*3/uL (ref 0.0–0.1)
Basophils Relative: 0 % (ref 0–1)
EOS PCT: 2 % (ref 0–5)
Eosinophils Absolute: 0.1 10*3/uL (ref 0.0–0.7)
HEMATOCRIT: 25.4 % — AB (ref 39.0–52.0)
Hemoglobin: 7.6 g/dL — ABNORMAL LOW (ref 13.0–17.0)
LYMPHS PCT: 20 % (ref 12–46)
Lymphs Abs: 1 10*3/uL (ref 0.7–4.0)
MCH: 22.8 pg — ABNORMAL LOW (ref 26.0–34.0)
MCHC: 29.9 g/dL — AB (ref 30.0–36.0)
MCV: 76.3 fL — ABNORMAL LOW (ref 78.0–100.0)
Monocytes Absolute: 1.3 10*3/uL — ABNORMAL HIGH (ref 0.1–1.0)
Monocytes Relative: 28 % — ABNORMAL HIGH (ref 3–12)
NEUTROS ABS: 2.4 10*3/uL (ref 1.7–7.7)
Neutrophils Relative %: 50 % (ref 43–77)
Platelets: 233 10*3/uL (ref 150–400)
RBC: 3.33 MIL/uL — AB (ref 4.22–5.81)
RDW: 17.1 % — ABNORMAL HIGH (ref 11.5–15.5)
WBC: 4.8 10*3/uL (ref 4.0–10.5)

## 2014-11-23 LAB — CBC
HEMATOCRIT: 27.8 % — AB (ref 39.0–52.0)
Hemoglobin: 8.2 g/dL — ABNORMAL LOW (ref 13.0–17.0)
MCH: 22.7 pg — ABNORMAL LOW (ref 26.0–34.0)
MCHC: 29.5 g/dL — ABNORMAL LOW (ref 30.0–36.0)
MCV: 77 fL — ABNORMAL LOW (ref 78.0–100.0)
PLATELETS: 278 10*3/uL (ref 150–400)
RBC: 3.61 MIL/uL — ABNORMAL LOW (ref 4.22–5.81)
RDW: 17.1 % — ABNORMAL HIGH (ref 11.5–15.5)
WBC: 5.5 10*3/uL (ref 4.0–10.5)

## 2014-11-23 LAB — I-STAT TROPONIN, ED: Troponin i, poc: 0.02 ng/mL (ref 0.00–0.08)

## 2014-11-23 LAB — CBG MONITORING, ED: GLUCOSE-CAPILLARY: 197 mg/dL — AB (ref 70–99)

## 2014-11-23 LAB — POC OCCULT BLOOD, ED: Fecal Occult Bld: NEGATIVE

## 2014-11-23 LAB — TSH: TSH: 2.352 u[IU]/mL (ref 0.350–4.500)

## 2014-11-23 LAB — BRAIN NATRIURETIC PEPTIDE: B NATRIURETIC PEPTIDE 5: 978.4 pg/mL — AB (ref 0.0–100.0)

## 2014-11-23 LAB — CREATININE, SERUM
CREATININE: 1.86 mg/dL — AB (ref 0.50–1.35)
GFR calc Af Amer: 38 mL/min — ABNORMAL LOW (ref >90)
GFR, EST NON AFRICAN AMERICAN: 33 mL/min — AB (ref >90)

## 2014-11-23 LAB — GLUCOSE, CAPILLARY: Glucose-Capillary: 189 mg/dL — ABNORMAL HIGH (ref 70–99)

## 2014-11-23 LAB — MAGNESIUM
Magnesium: 1.7 mg/dL (ref 1.5–2.5)
Magnesium: 1.4 mg/dL — ABNORMAL LOW (ref 1.5–2.5)

## 2014-11-23 LAB — TROPONIN I: Troponin I: 0.04 ng/mL — ABNORMAL HIGH (ref <0.031)

## 2014-11-23 MED ORDER — ACETAMINOPHEN 325 MG PO TABS: 650.0000 mg | ORAL_TABLET | Freq: Four times a day (QID) | ORAL | Status: DC | PRN

## 2014-11-23 MED ORDER — ONDANSETRON HCL 4 MG/2ML IJ SOLN
4.0000 mg | Freq: Four times a day (QID) | INTRAMUSCULAR | Status: DC | PRN
  Filled 2014-11-23: qty 2

## 2014-11-23 MED ORDER — ACETAMINOPHEN 650 MG RE SUPP: 650.0000 mg | Freq: Four times a day (QID) | RECTAL | Status: DC | PRN

## 2014-11-23 MED ORDER — INSULIN ASPART 100 UNIT/ML ~~LOC~~ SOLN
0.0000 [IU] | Freq: Three times a day (TID) | SUBCUTANEOUS | Status: DC
  Administered 2014-11-24 (×2): 3 [IU] via SUBCUTANEOUS
  Administered 2014-11-25: 5 [IU] via SUBCUTANEOUS
  Administered 2014-11-25: 3 [IU] via SUBCUTANEOUS
  Administered 2014-11-25: 2 [IU] via SUBCUTANEOUS
  Administered 2014-11-26: 3 [IU] via SUBCUTANEOUS
  Administered 2014-11-26: 5 [IU] via SUBCUTANEOUS
  Administered 2014-11-26: 3 [IU] via SUBCUTANEOUS
  Administered 2014-11-27: 2 [IU] via SUBCUTANEOUS
  Administered 2014-11-27 – 2014-11-28 (×5): 3 [IU] via SUBCUTANEOUS
  Administered 2014-11-29: 2 [IU] via SUBCUTANEOUS

## 2014-11-23 MED ORDER — CARVEDILOL 3.125 MG PO TABS
3.1250 mg | ORAL_TABLET | Freq: Two times a day (BID) | ORAL | Status: DC
  Administered 2014-11-24 – 2014-11-28 (×10): 3.125 mg via ORAL
  Filled 2014-11-23 (×13): qty 1

## 2014-11-23 MED ORDER — HEPARIN SODIUM (PORCINE) 5000 UNIT/ML IJ SOLN
5000.0000 [IU] | Freq: Three times a day (TID) | INTRAMUSCULAR | Status: DC
  Administered 2014-11-23 – 2014-11-29 (×17): 5000 [IU] via SUBCUTANEOUS
  Filled 2014-11-23 (×19): qty 1

## 2014-11-23 MED ORDER — LEVALBUTEROL HCL 0.63 MG/3ML IN NEBU: 0.6300 mg | INHALATION_SOLUTION | Freq: Four times a day (QID) | RESPIRATORY_TRACT | Status: DC | PRN

## 2014-11-23 MED ORDER — ONDANSETRON HCL 4 MG PO TABS: 4.0000 mg | ORAL_TABLET | Freq: Four times a day (QID) | ORAL | Status: DC | PRN

## 2014-11-23 MED ORDER — FUROSEMIDE 10 MG/ML IJ SOLN
60.0000 mg | Freq: Two times a day (BID) | INTRAMUSCULAR | Status: DC
  Administered 2014-11-23 – 2014-11-24 (×2): 60 mg via INTRAVENOUS
  Filled 2014-11-23 (×3): qty 6

## 2014-11-23 MED ORDER — ASPIRIN EC 81 MG PO TBEC: 81.0000 mg | DELAYED_RELEASE_TABLET | Freq: Every day | ORAL | Status: DC

## 2014-11-23 MED ORDER — FUROSEMIDE 10 MG/ML IJ SOLN
40.0000 mg | Freq: Once | INTRAMUSCULAR | Status: AC
  Administered 2014-11-23: 40 mg via INTRAVENOUS
  Filled 2014-11-23: qty 4

## 2014-11-23 MED ORDER — SODIUM CHLORIDE 0.9 % IJ SOLN
3.0000 mL | Freq: Two times a day (BID) | INTRAMUSCULAR | Status: DC
Start: 2014-11-23 — End: 2014-11-29
  Administered 2014-11-23 – 2014-11-29 (×10): 3 mL via INTRAVENOUS

## 2014-11-23 NOTE — Progress Notes (Signed)
1855 received from ED via [place].Kept comfortable in bed . Safety precaution observed

## 2014-11-23 NOTE — ED Notes (Signed)
Attempted report X1

## 2014-11-23 NOTE — ED Notes (Signed)
Pt unable to ambulate in hall due to being unstable when standing pt to get weight.  Pt began wheezing upon standing- MD aware

## 2014-11-23 NOTE — ED Notes (Signed)
To ED from home [facility] A16- with c/o extreme edema in legs, abd swelling, seen at [facility] on 11/02/14 for same.

## 2014-11-23 NOTE — ED Provider Notes (Signed)
CSN: [id number]     Arrival date & time 11/23/14  1430 History   First MD Initiated Contact with Patient 11/23/14 1500     Chief Complaint  Patient presents with  . Leg Swelling  . Cellulitis     (Consider location/radiation/quality/duration/timing/severity/associated sxs/prior Treatment) HPI  This is a 81 year old male with past medical history of coronary disease, CHF, chronic kidney disease, hyperlipidemia, obesity, chronic lymphedema in his bilateral lower extremities. Since July of this last year, he has been having worsening swelling in his bilateral lower extremities, pain with ambulation. Swelling is getting worse. He was seen at [facility] on the ninth of this month where he was also having leg swelling and was offered IV lasix but refused.  He also endorses mild shortness of breath at rest and shortness of breath on exertion that has gradually been getting worse.  He endorses 20lb weight gain in the past week.  No chest pain.    Past Medical History  Diagnosis Date  . Smoker   . Narcolepsy   . Hypertension   . BPH (benign prostatic hyperplasia)   . Arthritis   . Diabetes mellitus   . Neuropathy in diabetes   . Herpes   . Dyslipidemia   . Obesity   . Lymphedema     LE lymphedemia with hx of cellulitis.  . CKD (chronic kidney disease)   . Anemia   . History of GI bleed 06/2012    EGD unremarkable;  Colo with large cecal polyp (bx benign)  . Ischemic cardiomyopathy     Echo 06/19/12: EF 40-45%, mid to distal anteroseptal HK, moderate LAE, PASP 37  . Chronic systolic heart failure   . CAD (coronary artery disease)     a. probable underlying CAD;  b. Type 2 NSTEMi in setting of profound anemia (Hgb 3.6) from GI bleed in 06/2012 and WMA on echo;  c. med Rx due to CKD and GI bleed  . CHF (congestive heart failure)   . Atrial fibrillation    Past Surgical History  Procedure Laterality Date  . Leg surgery      Left leg surgery. broken leg  . Esophagogastroduodenoscopy   06/19/2012    Procedure: ESOPHAGOGASTRODUODENOSCOPY (EGD);  Surgeon: Beryle Beams, MD;  Location: [facility] ENDOSCOPY;  Service: Endoscopy;  Laterality: N/A;  . Colonoscopy  06/21/2012    Procedure: COLONOSCOPY;  Surgeon: Juanita Craver, MD;  Location: [facility] ENDOSCOPY;  Service: Endoscopy;  Laterality: N/A;  . Colonoscopy Left 02/13/2014    Procedure: COLONOSCOPY;  Surgeon: Juanita Craver, MD;  Location: [place];  Service: Endoscopy;  Laterality: Left;  . Laparoscopic right colectomy Right 02/15/2014    Procedure: LAPAROSCOPIC ASISSTED RIGHT  COLECTOMY, POSSIBLE OPEN;  Surgeon: Rolm Bookbinder, MD;  Location: [place];  Service: General;  Laterality: Right;   Family History  Problem Relation Age of Onset  . Brain cancer Mother   . Diabetes type II Sister   . Narcolepsy Paternal Uncle    History  Substance Use Topics  . Smoking status: Former Smoker    Quit date: 08/26/1998  . Smokeless tobacco: Never Used  . Alcohol Use: No     Comment: in the past    Review of Systems  Constitutional: Negative for fever and chills.  Eyes: Negative for redness.  Respiratory: Positive for shortness of breath. Negative for cough.   Cardiovascular: Positive for leg swelling. Negative for chest pain.  Gastrointestinal: Negative for nausea, vomiting, abdominal pain and diarrhea.  Genitourinary: Negative for  dysuria.  Skin: Negative for rash.  Neurological: Negative for headaches.  All other systems reviewed and are negative.     Allergies  Review of patient's allergies indicates no known allergies.  Home Medications   Prior to Admission medications   Medication Sig Start Date End Date Taking? Authorizing Provider  amLODipine (NORVASC) 10 MG tablet Take 10 mg by mouth daily.    Historical Provider, MD  furosemide (LASIX) 40 MG tablet Take 1 tablet (40 mg total) by mouth daily. 01/10/14   Denita Lung, MD   BP 162/63 mmHg  Pulse 72  Temp(Src) 98 F (36.7 C) (Oral)  Resp 20  Ht 5\' 10"  (1.778 m)   SpO2 99% Physical Exam  Constitutional: He is oriented to person, place, and time. No distress.  HENT:  Head: Normocephalic and atraumatic.  Eyes: EOM are normal. Pupils are equal, round, and reactive to light.  Neck: Normal range of motion. Neck supple.  Cardiovascular: Normal rate.   Pulmonary/Chest: Effort normal. No respiratory distress. He has rales (faint).  Abdominal: Soft. There is no tenderness.  Musculoskeletal: Normal range of motion. He exhibits edema (3+ bilateral lower ext swelling with some stasis changes that are symmetrical).  Neurological: He is alert and oriented to person, place, and time.  Skin: No rash noted. He is not diaphoretic.  Psychiatric: He has a normal mood and affect.    ED Course  Procedures (including critical care time) Labs Review Labs Reviewed  I-STAT CHEM 8, ED    Imaging Review No results found.   EKG Interpretation   Date/Time:  Wednesday November 23 2014 14:33:47 EDT Ventricular Rate:  71 PR Interval:  237 QRS Duration: 84 QT Interval:  446 QTC Calculation: 485 R Axis:   10 Text Interpretation:  Sinus arrhythmia Prolonged PR interval Borderline  abnrm T, anterolateral leads Borderline prolonged QT interval Baseline  wander in lead(s) II III aVF Confirmed by Alvino Chapel  MD, Ovid Curd [phone]) on  11/23/2014 3:14:09 PM      MDM   Final diagnoses:  None    This is a 81 year old male with past medical history of coronary disease, CHF, chronic kidney disease, hyperlipidemia, obesity, chronic lymphedema in his bilateral lower extremities.  Comes in with lower extremity swelling and mild SOB  On exam, 3+ edema on bilateral LE, no evidence of cellulitis.  Faint crackles bilaterally.  Will obtain basic labs, bnp, istat trop, ekg, cxr and re-eval.  Feel the leg findings are consistent with bilateral edema w/o evidence of infection.  The swelling is symmetric bilaterally.  He had a LE duplex of the left leg last year that showed no DVT and  because of the symmetric nature of the swelling, I feel DVT is unlikely at this time.  Patient having SOB w/ ambulation.  CXR w/ volume overload, bnp elevated.  Feel picture consistent with CHF.  Given symptomatic will admit for diuresis.  Have given 40mg  IV lasix.      Jarome Matin, MD 11/24/14 8315  Davonna Belling, MD 11/26/14 [phone]

## 2014-11-23 NOTE — H&P (Signed)
[facility] History and Physical  [name] VCB:[id number] DOB: 12-23-1933 DOA: 11/23/2014  Referring physician: ER PCP: Philis Fendt, MD   Chief Complaint: Shortness of breath HPI:  81 year old male with a history of diabetes, hypertension, ischemic cardiomyopathy, narcolepsy, morbid obesity, poor compliance with medications, presents to the ED today with shortness of breath. Patient also has a history of bilateral lower extremity lymphedema. The patient states that he has had worsening swelling for about 2 months. He has also noticed worsening orthopnea, abdominal bloating, worsening dyspnea on exertion, difficulty ambulating, difficulty climbing up the stairs because of fluid retention in his legs. Patient notified his PCP and was recommended to go to the ED on 11/02/14. Patient was found to have 3+ pitting edema in bilateral lower extremities, no warmth or erythema, patient received by mouth Lasix in the ED, and refused IV Lasix. Creatinine has slowly been increasing from a baseline of 1. 5 since 2015. Patient was also found to be hypertensive, but refused admission during his last ED visit. Patient denies any fever, chills also throat.On exam, 3+ edema on bilateral LE, no evidence of cellulitis. Faint crackles bilaterally. Creatinine up to 1.9, hemoglobin is 9.5, BNP 978. FOBT negative      Review of Systems: negative for the following  Constitutional: Denies fever, chills, diaphoresis, appetite change and fatigue.  HEENT: Denies photophobia, eye pain, redness, hearing loss, ear pain, congestion, sore throat, rhinorrhea, sneezing, mouth sores, trouble swallowing, neck pain, neck stiffness and tinnitus.  Respiratory: Positive for shortness of breath. Negative for cough.  Cardiovascular: Positive for leg swelling  Gastrointestinal: Denies nausea, vomiting, abdominal pain, diarrhea, constipation, blood in stool and abdominal distention.  Genitourinary: Denies dysuria, urgency,  frequency, hematuria, flank pain and difficulty urinating.  Musculoskeletal: Denies myalgias, back pain, joint swelling, arthralgias and gait problem.  Skin: Denies pallor, rash and wound.  Neurological: Denies dizziness, seizures, syncope, weakness, light-headedness, numbness and headaches.  Hematological: Denies adenopathy. Easy bruising, personal or family bleeding history  Psychiatric/Behavioral: Denies suicidal ideation, mood changes, confusion, nervousness, sleep disturbance and agitation       Past Medical History  Diagnosis Date  . Smoker   . Narcolepsy   . Hypertension   . BPH (benign prostatic hyperplasia)   . Arthritis   . Diabetes mellitus   . Neuropathy in diabetes   . Herpes   . Dyslipidemia   . Obesity   . Lymphedema     LE lymphedemia with hx of cellulitis.  . CKD (chronic kidney disease)   . Anemia   . History of GI bleed 06/2012    EGD unremarkable;  Colo with large cecal polyp (bx benign)  . Ischemic cardiomyopathy     Echo 06/19/12: EF 40-45%, mid to distal anteroseptal HK, moderate LAE, PASP 37  . Chronic systolic heart failure   . CAD (coronary artery disease)     a. probable underlying CAD;  b. Type 2 NSTEMi in setting of profound anemia (Hgb 3.6) from GI bleed in 06/2012 and WMA on echo;  c. med Rx due to CKD and GI bleed  . CHF (congestive heart failure)   . Atrial fibrillation      Past Surgical History  Procedure Laterality Date  . Leg surgery      Left leg surgery. broken leg  . Esophagogastroduodenoscopy  06/19/2012    Procedure: ESOPHAGOGASTRODUODENOSCOPY (EGD);  Surgeon: Beryle Beams, MD;  Location: [facility] ENDOSCOPY;  Service: Endoscopy;  Laterality: N/A;  . Colonoscopy  06/21/2012    Procedure:  COLONOSCOPY;  Surgeon: Juanita Craver, MD;  Location: [facility] ENDOSCOPY;  Service: Endoscopy;  Laterality: N/A;  . Colonoscopy Left 02/13/2014    Procedure: COLONOSCOPY;  Surgeon: Juanita Craver, MD;  Location: [place];  Service: Endoscopy;  Laterality:  Left;  . Laparoscopic right colectomy Right 02/15/2014    Procedure: LAPAROSCOPIC ASISSTED RIGHT  COLECTOMY, POSSIBLE OPEN;  Surgeon: Rolm Bookbinder, MD;  Location: [place];  Service: General;  Laterality: Right;      Social History:  reports that he quit smoking about 16 years ago. He has never used smokeless tobacco. He reports that he does not drink alcohol or use illicit drugs.  No Known Allergies  Family History  Problem Relation Age of Onset  . Brain cancer Mother   . Diabetes type II Sister   . Narcolepsy Paternal Uncle         Prior to Admission medications   Medication Sig Start Date End Date Taking? Authorizing Provider  amLODipine (NORVASC) 10 MG tablet Take 10 mg by mouth daily.   Yes Historical Provider, MD  furosemide (LASIX) 40 MG tablet Take 1 tablet (40 mg total) by mouth daily. 01/10/14  Yes Denita Lung, MD     Physical Exam: Filed Vitals:   11/23/14 1436 11/23/14 1524 11/23/14 1651 11/23/14 1652  BP: 162/63  176/74 176/71  Pulse: 72   69  Temp: 98 F (36.7 C)  98.1 F (36.7 C)   TempSrc: Oral  Oral   Resp: 20  19 21   Height: 5\' 10"  (1.778 m)     Weight:  130.953 kg (288 lb 11.2 oz)    SpO2: 99%  98% 99%     Constitutional: Vital signs reviewed. Patient is a well-developed and well-nourished in no acute distress and cooperative with exam. Alert and oriented x3.  Head: Normocephalic and atraumatic  Ear: TM normal bilaterally  Mouth: no erythema or exudates, MMM  Eyes: PERRL, EOMI, conjunctivae normal, No scleral icterus.  Neck: Supple, Trachea midline normal ROM, No JVD, mass, thyromegaly, or carotid bruit present.  Cardiovascular: RRR, S1 normal, S2 normal, no MRG, pulses symmetric and intact bilaterally  Pulmonary/Chest: By basilar crackles, no wheezes, rales, or rhonchi  Abdominal: Soft. Non-tender, non-distended, bowel sounds are normal, no masses, organomegaly, or guarding present.  GU: no CVA tenderness Musculoskeletal: No joint  deformities, erythema, or stiffness, ROM full and no nontender Ext: no edema and no cyanosis, pulses palpable bilaterally (DP and PT)  Hematology: no cervical, inginal, or axillary adenopathy.  Neurological: A&O x3, Strenght is normal and symmetric bilaterally, cranial nerve II-XII are grossly intact, no focal motor deficit, sensory intact to light touch bilaterally.  Musculoskeletal: Normal range of motion. He exhibits edema (3+ bilateral lower ext swelling with some stasis changes that are symmetrical). g.  Psychiatric: Normal mood and affect. speech and behavior is normal. Judgment and thought content normal. Cognition and memory are normal.       Labs on Admission:    Basic Metabolic Panel:  Recent Labs Lab 11/23/14 1551 11/23/14 1608  NA 137 140  K 4.3 4.4  CL 103 101  CO2 24  --   GLUCOSE 197* 200*  BUN 27* 28*  CREATININE 1.81* 1.90*  CALCIUM 7.9*  --   MG 1.7  --    Liver Function Tests: No results for input(s): AST, ALT, ALKPHOS, BILITOT, PROT, ALBUMIN in the last 168 hours. No results for input(s): LIPASE, AMYLASE in the last 168 hours. No results for input(s): AMMONIA in the  last 168 hours. CBC:  Recent Labs Lab 11/23/14 1551 11/23/14 1608  WBC 4.8  --   NEUTROABS 2.4  --   HGB 7.6* 9.5*  HCT 25.4* 28.0*  MCV 76.3*  --   PLT 233  --    Cardiac Enzymes: No results for input(s): CKTOTAL, CKMB, CKMBINDEX, TROPONINI in the last 168 hours.  BNP (last 3 results)  Recent Labs  10/20/14 1503 11/23/14 1551  BNP 599.1* 978.4*    ProBNP (last 3 results)  Recent Labs  12/08/13 1502 02/11/14 2256  PROBNP 1417.0* 2409.0*       CBG:  Recent Labs Lab 11/23/14 1550  GLUCAP 197*    Radiological Exams on Admission: Dg Chest 2 View  11/23/2014   CLINICAL DATA:  Leg swelling.  Cellulitis  EXAM: CHEST  2 VIEW  COMPARISON:  05/21/2014  FINDINGS: Cardiac enlargement. Vascular congestion with mild interstitial edema. Small bilateral pleural effusions  have developed since the prior study. Negative for pneumonia.  Lungs are hyperinflated  IMPRESSION: Findings consistent with fluid overload with vascular congestion and small pleural effusions.   Electronically Signed   By: Franchot Gallo M.D.   On: 11/23/2014 16:17    EKG: Independently reviewed. Date/Time: Wednesday November 23 2014 14:33:47 EDT Ventricular Rate: 71 PR Interval: 237 QRS Duration: 84 QT Interval: 446 QTC Calculation: 485 R Axis: 10 Text Interpretation: Sinus arrhythmia Prolonged PR interval Borderline  abnrm T, anterolateral leads Borderline prolonged QT interval Baseline   Assessment/Plan Principal Problem:,     CHF (congestive heart failure)Likely acute on chronic diastolic heart failure Last 2-D echo showed an EF of 60-65% on 11/13/13 Patient will be admitted to telemetry We will cycle cardiac enzymes, repeat 2-D echo May benefit from being on a Lasix drip Start the patient on Lasix 60 mg IV twice a day tonight We will obtain cardiology consultation for heart failure with ejection fraction has changed Check TSH Patient was on lisinopril 2015 Hold ARB/ACE inhibitor secondary to renal insufficiency  Active Problems:   Hyperlipemia-repeat lipid panel    Obesity-weight loss counseling will be done    BPH (benign prostatic hyperplasia)    CKD (chronic kidney disease), stage III-creatinine has been trending up Cautiously diurese and monitor creatinine If worsens patient may need nephrology consultation Will order renal ultrasound    Anemia-   Pedal edema  Abdominal bloating Will obtain abdominal ultrasound to rule out ascites Patient has a history of cecal cancer  Hypertension Start the patient on prn hydralazine for systolic greater than 937   diabetes mellitus type 2 Last hemoglobin A1c was 7.0 Patient will be started on SSI         Code Status:   full Family Communication: bedside Disposition Plan: admit   Time spent: 70 mins    [facility] Pager [phone]  If 7PM-7AM, please contact night-coverage [email or web address] Password [email or web address] 11/23/2014, 6:18 PM

## 2014-11-24 ENCOUNTER — Inpatient Hospital Stay (HOSPITAL_COMMUNITY): Payer: Medicare Other

## 2014-11-24 DIAGNOSIS — N183 Chronic kidney disease, stage 3 (moderate): Secondary | ICD-10-CM

## 2014-11-24 DIAGNOSIS — D509 Iron deficiency anemia, unspecified: Secondary | ICD-10-CM

## 2014-11-24 DIAGNOSIS — I5031 Acute diastolic (congestive) heart failure: Secondary | ICD-10-CM

## 2014-11-24 DIAGNOSIS — J9621 Acute and chronic respiratory failure with hypoxia: Secondary | ICD-10-CM

## 2014-11-24 DIAGNOSIS — E785 Hyperlipidemia, unspecified: Secondary | ICD-10-CM

## 2014-11-24 LAB — LIPID PANEL
Cholesterol: 126 mg/dL (ref 0–200)
HDL: 27 mg/dL — ABNORMAL LOW (ref >39)
LDL CALC: 85 mg/dL (ref 0–99)
Total CHOL/HDL Ratio: 4.7 RATIO
Triglycerides: 70 mg/dL (ref <150)
VLDL: 14 mg/dL (ref 0–40)

## 2014-11-24 LAB — CBC
HEMATOCRIT: 24.4 % — AB (ref 39.0–52.0)
HEMOGLOBIN: 7.2 g/dL — AB (ref 13.0–17.0)
MCH: 22.4 pg — ABNORMAL LOW (ref 26.0–34.0)
MCHC: 29.5 g/dL — AB (ref 30.0–36.0)
MCV: 76 fL — AB (ref 78.0–100.0)
Platelets: 248 10*3/uL (ref 150–400)
RBC: 3.21 MIL/uL — ABNORMAL LOW (ref 4.22–5.81)
RDW: 17 % — AB (ref 11.5–15.5)
WBC: 5 10*3/uL (ref 4.0–10.5)

## 2014-11-24 LAB — COMPREHENSIVE METABOLIC PANEL
ALK PHOS: 57 U/L (ref 39–117)
ALT: 7 U/L (ref 0–53)
ANION GAP: 8 (ref 5–15)
AST: 8 U/L (ref 0–37)
Albumin: 2.8 g/dL — ABNORMAL LOW (ref 3.5–5.2)
BILIRUBIN TOTAL: 0.5 mg/dL (ref 0.3–1.2)
BUN: 27 mg/dL — AB (ref 6–23)
CHLORIDE: 102 mmol/L (ref 96–112)
CO2: 28 mmol/L (ref 19–32)
Calcium: 8.2 mg/dL — ABNORMAL LOW (ref 8.4–10.5)
Creatinine, Ser: 1.84 mg/dL — ABNORMAL HIGH (ref 0.50–1.35)
GFR calc Af Amer: 39 mL/min — ABNORMAL LOW (ref >90)
GFR, EST NON AFRICAN AMERICAN: 33 mL/min — AB (ref >90)
Glucose, Bld: 167 mg/dL — ABNORMAL HIGH (ref 70–99)
Potassium: 4.4 mmol/L (ref 3.5–5.1)
Sodium: 138 mmol/L (ref 135–145)
TOTAL PROTEIN: 5.9 g/dL — AB (ref 6.0–8.3)

## 2014-11-24 LAB — IRON AND TIBC
Iron: 17 ug/dL — ABNORMAL LOW (ref 42–165)
Saturation Ratios: 5 % — ABNORMAL LOW (ref 20–55)
TIBC: 324 ug/dL (ref 215–435)
UIBC: 307 ug/dL (ref 125–400)

## 2014-11-24 LAB — RETICULOCYTES
RBC.: 3.66 MIL/uL — ABNORMAL LOW (ref 4.22–5.81)
RETIC COUNT ABSOLUTE: 76.9 10*3/uL (ref 19.0–186.0)
RETIC CT PCT: 2.1 % (ref 0.4–3.1)

## 2014-11-24 LAB — URINALYSIS, ROUTINE W REFLEX MICROSCOPIC
Bilirubin Urine: NEGATIVE
GLUCOSE, UA: NEGATIVE mg/dL
Hgb urine dipstick: NEGATIVE
KETONES UR: NEGATIVE mg/dL
Leukocytes, UA: NEGATIVE
Nitrite: NEGATIVE
Protein, ur: NEGATIVE mg/dL
Specific Gravity, Urine: 1.007 (ref 1.005–1.030)
UROBILINOGEN UA: 0.2 mg/dL (ref 0.0–1.0)
pH: 7 (ref 5.0–8.0)

## 2014-11-24 LAB — FERRITIN: Ferritin: 18 ng/mL — ABNORMAL LOW (ref 22–322)

## 2014-11-24 LAB — GLUCOSE, CAPILLARY
GLUCOSE-CAPILLARY: 154 mg/dL — AB (ref 70–99)
GLUCOSE-CAPILLARY: 157 mg/dL — AB (ref 70–99)
GLUCOSE-CAPILLARY: 125 mg/dL — AB (ref 70–99)
Glucose-Capillary: 190 mg/dL — ABNORMAL HIGH (ref 70–99)
Glucose-Capillary: 164 mg/dL — ABNORMAL HIGH (ref 70–99)

## 2014-11-24 LAB — VITAMIN B12: Vitamin B-12: 513 pg/mL (ref 211–911)

## 2014-11-24 LAB — TROPONIN I
TROPONIN I: 0.03 ng/mL (ref <0.031)
TROPONIN I: 0.04 ng/mL — AB (ref <0.031)

## 2014-11-24 LAB — FOLATE: Folate: 12.6 ng/mL

## 2014-11-24 MED ORDER — ISOSORB DINITRATE-HYDRALAZINE 20-37.5 MG PO TABS
1.0000 | ORAL_TABLET | Freq: Two times a day (BID) | ORAL | Status: DC
  Administered 2014-11-24 – 2014-11-26 (×6): 1 via ORAL
  Filled 2014-11-24 (×8): qty 1

## 2014-11-24 MED ORDER — METOLAZONE 2.5 MG PO TABS
2.5000 mg | ORAL_TABLET | Freq: Once | ORAL | Status: AC
  Administered 2014-11-24: 2.5 mg via ORAL
  Filled 2014-11-24: qty 1

## 2014-11-24 MED ORDER — ATORVASTATIN CALCIUM 40 MG PO TABS
40.0000 mg | ORAL_TABLET | Freq: Every day | ORAL | Status: DC
  Administered 2014-11-24 – 2014-11-28 (×5): 40 mg via ORAL
  Filled 2014-11-24 (×6): qty 1

## 2014-11-24 MED ORDER — FUROSEMIDE 10 MG/ML IJ SOLN
10.0000 mg/h | INTRAVENOUS | Status: DC
  Administered 2014-11-24: 8 mg/h via INTRAVENOUS
  Administered 2014-11-25: 10 mg/h via INTRAVENOUS
  Filled 2014-11-24 (×4): qty 25

## 2014-11-24 NOTE — Progress Notes (Addendum)
[facility] PROGRESS NOTE Interim History: 81 year old male with past medical history of diabetes, hypertension, ischemic cardiomyopathy and morbid obesity that comes in to emergency room complaining of shortness of breath. She relates she's had worsening lower extremity swelling for the past 2 months which has progressively gotten worse, she's also had some orthopnea and abdominal discomfort and dyspnea on exertion. Filed Weights   11/23/14 1524 11/23/14 1851 11/24/14 0509  Weight: 130.953 kg (288 lb 11.2 oz) 128.05 kg (282 lb 4.8 oz) 125.329 kg (276 lb 4.8 oz)        Intake/Output Summary (Last 24 hours) at 11/24/14 0858 Last data filed at 11/24/14 2440  Gross per 24 hour  Intake      3 ml  Output      0 ml  Net      3 ml     Assessment/Plan: Acute diastolic heart failure, NYHA class 2: - Most likely due to noncompliance of medications and fluid restriction. - Last echo on 11/13/2013 showed grade 1 diastolic heart failure, cardiac enzymes have been cycled, and had been essentially negative. - She was started on IV fluids nausea and was recorded, continue to monitor creatinine and potassium. - 2-D echo was ordered which is pending. - We'll switch him to IV Lasix drip. - At this amount of his baseline creatinine has been fluctuating over the last year. He does have a history of noncompliance.  CKD (chronic kidney disease), stage III - Unknown baseline creatinine, continue to trend.  Hyperlipemia - Check a fasting lipid panel start Lipitor.  Obesity: - Counseling will need a CT study as an outpatient.  Microcytic anemia: - His RDW was also high, will check an anemia panel.  Essential hypertension: - Currently on Lasix, will add BiDil. - monitor   Code Status: full Family Communication: none  Disposition Plan: inpatient   Consultants:  none  Procedures: ECHO: pending  Antibiotics:  None  HPI/Subjective: He relates his shortness of breath is  slightly better, he slightly hostile in his tone, cannot lay flat to sleep.  Objective: Filed Vitals:   11/23/14 1851 11/23/14 2043 11/24/14 0145 11/24/14 0509  BP: 183/86 156/60 166/72 155/73  Pulse: 72 71 73 75  Temp: 98.5 F (36.9 C) 98.2 F (36.8 C) 98.2 F (36.8 C) 98.3 F (36.8 C)  TempSrc: Oral Oral Oral Oral  Resp: 20  18 16   Height: 5' 10.5" (1.791 m)     Weight: 128.05 kg (282 lb 4.8 oz)   125.329 kg (276 lb 4.8 oz)  SpO2: 100% 97% 96% 99%     Exam:  General: Alert, awake, oriented x3, in no acute distress.  HEENT: No bruits, no goiter. Positive JVD Heart: Regular rate and rhythm, anasarca Lungs: Good air movement, bilateral crackles. Abdomen: Soft, nontender, nondistended, positive bowel sounds.  Neuro: Grossly intact, nonfocal.   Data Reviewed: Basic Metabolic Panel:  Recent Labs Lab 11/23/14 1551 11/23/14 1608 11/23/14 1955 11/24/14 0500  NA 137 140  --  138  K 4.3 4.4  --  4.4  CL 103 101  --  102  CO2 24  --   --  28  GLUCOSE 197* 200*  --  167*  BUN 27* 28*  --  27*  CREATININE 1.81* 1.90* 1.86* 1.84*  CALCIUM 7.9*  --   --  8.2*  MG 1.7  --  1.4*  --    Liver Function Tests:  Recent Labs Lab 11/23/14 1955 11/24/14 0500  AST 9 8  ALT 10 7  ALKPHOS 67 57  BILITOT 0.6 0.5  PROT 7.0 5.9*  ALBUMIN 3.2* 2.8*   No results for input(s): LIPASE, AMYLASE in the last 168 hours. No results for input(s): AMMONIA in the last 168 hours. CBC:  Recent Labs Lab 11/23/14 1551 11/23/14 1608 11/23/14 1955 11/24/14 0500  WBC 4.8  --  5.5 5.0  NEUTROABS 2.4  --   --   --   HGB 7.6* 9.5* 8.2* 7.2*  HCT 25.4* 28.0* 27.8* 24.4*  MCV 76.3*  --  77.0* 76.0*  PLT 233  --  278 248   Cardiac Enzymes:  Recent Labs Lab 11/23/14 1955 11/23/14 2345 11/24/14 0635  TROPONINI 0.04* 0.03 0.04*   BNP (last 3 results)  Recent Labs  10/20/14 1503 11/23/14 1551  BNP 599.1* 978.4*    ProBNP (last 3 results)  Recent Labs  12/08/13 1502  02/11/14 2256  PROBNP 1417.0* 2409.0*    CBG:  Recent Labs Lab 11/23/14 1550 11/23/14 2047 11/24/14 0706  GLUCAP 197* 189* 154*    No results found for this or any previous visit (from the past 240 hour(s)).   Studies: Dg Chest 2 View  11/23/2014   CLINICAL DATA:  Leg swelling.  Cellulitis  EXAM: CHEST  2 VIEW  COMPARISON:  05/21/2014  FINDINGS: Cardiac enlargement. Vascular congestion with mild interstitial edema. Small bilateral pleural effusions have developed since the prior study. Negative for pneumonia.  Lungs are hyperinflated  IMPRESSION: Findings consistent with fluid overload with vascular congestion and small pleural effusions.   Electronically Signed   By: Franchot Gallo M.D.   On: 11/23/2014 16:17    Scheduled Meds: . carvedilol  3.125 mg Oral BID WC  . furosemide  60 mg Intravenous Q12H  . heparin  5,000 Units Subcutaneous 3 times per day  . insulin aspart  0-15 Units Subcutaneous TID WC  . sodium chloride  3 mL Intravenous Q12H   Continuous Infusions:    Charlynne Cousins  [facility] Pager [phone] If 7PM-7AM, please contact night-coverage at [email or web address], password [facility] 11/24/2014, 8:58 AM  LOS: 1 day

## 2014-11-25 DIAGNOSIS — R0600 Dyspnea, unspecified: Secondary | ICD-10-CM

## 2014-11-25 LAB — MRSA PCR SCREENING: MRSA by PCR: NEGATIVE

## 2014-11-25 LAB — RAPID URINE DRUG SCREEN, HOSP PERFORMED
AMPHETAMINES: NOT DETECTED
BARBITURATES: NOT DETECTED
Benzodiazepines: NOT DETECTED
COCAINE: NOT DETECTED
OPIATES: NOT DETECTED
Tetrahydrocannabinol: NOT DETECTED

## 2014-11-25 LAB — URINALYSIS, ROUTINE W REFLEX MICROSCOPIC
Bilirubin Urine: NEGATIVE
Glucose, UA: NEGATIVE mg/dL
HGB URINE DIPSTICK: NEGATIVE
KETONES UR: NEGATIVE mg/dL
Leukocytes, UA: NEGATIVE
Nitrite: NEGATIVE
Protein, ur: NEGATIVE mg/dL
Specific Gravity, Urine: 1.006 (ref 1.005–1.030)
UROBILINOGEN UA: 0.2 mg/dL (ref 0.0–1.0)
pH: 7.5 (ref 5.0–8.0)

## 2014-11-25 LAB — GLUCOSE, CAPILLARY
GLUCOSE-CAPILLARY: 148 mg/dL — AB (ref 70–99)
GLUCOSE-CAPILLARY: 161 mg/dL — AB (ref 70–99)
Glucose-Capillary: 215 mg/dL — ABNORMAL HIGH (ref 70–99)
Glucose-Capillary: 192 mg/dL — ABNORMAL HIGH (ref 70–99)

## 2014-11-25 LAB — HEMOGLOBIN A1C
HEMOGLOBIN A1C: 8.4 % — AB (ref 4.8–5.6)
Mean Plasma Glucose: 194 mg/dL

## 2014-11-25 MED ORDER — METOLAZONE 2.5 MG PO TABS
2.5000 mg | ORAL_TABLET | Freq: Once | ORAL | Status: AC
  Administered 2014-11-25: 2.5 mg via ORAL
  Filled 2014-11-25: qty 1

## 2014-11-25 MED ORDER — SODIUM CHLORIDE 0.9 % IV SOLN
500.0000 mg | Freq: Once | INTRAVENOUS | Status: AC
  Administered 2014-11-26: 500 mg via INTRAVENOUS
  Filled 2014-11-25 (×3): qty 10

## 2014-11-25 MED ORDER — SODIUM CHLORIDE 0.9 % IV SOLN
25.0000 mg | Freq: Once | INTRAVENOUS | Status: AC
  Administered 2014-11-25: 25 mg via INTRAVENOUS
  Filled 2014-11-25: qty 0.5

## 2014-11-25 NOTE — Progress Notes (Signed)
  Echocardiogram 2D Echocardiogram has been performed.  [name] 11/25/2014, 3:05 PM

## 2014-11-25 NOTE — Clinical Social Work Note (Signed)
CSW met with patient to discuss going to a SNF for short term rehab.  Patient was expressing that he lives at home with his daughter and he does not really want to go to a SNF.  Patient talked about how since he retired he has not been in as good of health, and he was upset that his health has gone downhill.  Patient expressed he has spent time in a nursing home and did not really like it.  Patient talked about how he took care of his wife for a few years at home until she passed away.  Patient talked about how he used to golf all the time, and how he is unable to do it anymore.  Patient expressed that he feels like his daughter wants him to move to a nursing home.  Patient was not sure if he wants to go to a SNF for rehab or just return home.  Patient said he will think about it and agreed to have CSW check back with him.  Patient did agree to start the process of looking for a SNF for short term rehab.  CSW to work on [employer] and fax patient out, formal assessment to follow.  Jones Broom. [place], MSW, [place] 11/25/2014 10:17 PM

## 2014-11-25 NOTE — Plan of Care (Signed)
Problem: Phase I Progression Outcomes Goal: Voiding-avoid urinary catheter unless indicated Outcome: Progressing Patient has a condom cath

## 2014-11-25 NOTE — Progress Notes (Signed)
0730with runs of aberrancy the patient eating breakfast asymptomatic

## 2014-11-25 NOTE — Progress Notes (Signed)
[facility] PROGRESS NOTE Interim History: 81 year old male with past medical history of diabetes, hypertension, ischemic cardiomyopathy and morbid obesity that comes in to emergency room complaining of shortness of breath. She relates she's had worsening lower extremity swelling for the past 2 months which has progressively gotten worse, she's also had some orthopnea and abdominal discomfort and dyspnea on exertion. Filed Weights   11/23/14 1851 11/24/14 0509 11/25/14 0441  Weight: 128.05 kg (282 lb 4.8 oz) 125.329 kg (276 lb 4.8 oz) 121.2 kg (267 lb 3.2 oz)        Intake/Output Summary (Last 24 hours) at 11/25/14 0959 Last data filed at 11/25/14 0900  Gross per 24 hour  Intake 1681.86 ml  Output   6350 ml  Net -4668.14 ml     Assessment/Plan: Acute diastolic heart failure, NYHA class 2: - Most likely due to noncompliance of medications and fluid restriction. - Last echo on 11/13/2013 showed grade 1 diastolic heart failure, cardiac enzymes have been cycled, and had been essentially negative. - Continue to monitor creatinine and potassium. - 2-D echo was ordered which is pending. - increase IV Lasix drip. No ACE inhibitor or ARB at this point due to chronic renal disease.  CKD (chronic kidney disease), stage III - Unknown baseline creatinine, continue to trend.  Hyperlipemia - Check a fasting lipid panel start Lipitor.  Obesity: - Counseling will need a CT study as an outpatient.  Microcytic anemia: - His RDW was also high, will check an anemia panel. - FOBT stools   Essential hypertension: - Currently on Lasix, will add BiDil. - monitor   History of carcinoma of the cecum: - Follow-up with oncology as an outpatient:  Code Status: full Family Communication: none  Disposition Plan: inpatient   Consultants:  none  Procedures: ECHO: pending  Antibiotics:  None  HPI/Subjective: Shortness of breath is slightly improved no  complaints.  Objective: Filed Vitals:   11/24/14 0509 11/24/14 1441 11/24/14 2046 11/25/14 0441  BP: 155/73 149/58 152/63 166/69  Pulse: 75 72 71 75  Temp: 98.3 F (36.8 C) 98.3 F (36.8 C) 98.2 F (36.8 C) 98.1 F (36.7 C)  TempSrc: Oral Oral Axillary Oral  Resp: 16 18 18 18   Height:      Weight: 125.329 kg (276 lb 4.8 oz)   121.2 kg (267 lb 3.2 oz)  SpO2: 99% 98% 97% 98%     Exam:  General: Alert, awake, oriented x3, in no acute distress.  HEENT: No bruits, no goiter. Positive JVD Heart: Regular rate and rhythm, anasarca Lungs: Good air movement, bilateral crackles. Abdomen: Soft, nontender, nondistended, positive bowel sounds.  Neuro: Grossly intact, nonfocal.   Data Reviewed: Basic Metabolic Panel:  Recent Labs Lab 11/23/14 1551 11/23/14 1608 11/23/14 1955 11/24/14 0500  NA 137 140  --  138  K 4.3 4.4  --  4.4  CL 103 101  --  102  CO2 24  --   --  28  GLUCOSE 197* 200*  --  167*  BUN 27* 28*  --  27*  CREATININE 1.81* 1.90* 1.86* 1.84*  CALCIUM 7.9*  --   --  8.2*  MG 1.7  --  1.4*  --    Liver Function Tests:  Recent Labs Lab 11/23/14 1955 11/24/14 0500  AST 9 8  ALT 10 7  ALKPHOS 67 57  BILITOT 0.6 0.5  PROT 7.0 5.9*  ALBUMIN 3.2* 2.8*   No results for input(s): LIPASE, AMYLASE in the last 168  hours. No results for input(s): AMMONIA in the last 168 hours. CBC:  Recent Labs Lab 11/23/14 1551 11/23/14 1608 11/23/14 1955 11/24/14 0500  WBC 4.8  --  5.5 5.0  NEUTROABS 2.4  --   --   --   HGB 7.6* 9.5* 8.2* 7.2*  HCT 25.4* 28.0* 27.8* 24.4*  MCV 76.3*  --  77.0* 76.0*  PLT 233  --  278 248   Cardiac Enzymes:  Recent Labs Lab 11/23/14 1955 11/23/14 2345 11/24/14 0635  TROPONINI 0.04* 0.03 0.04*   BNP (last 3 results)  Recent Labs  10/20/14 1503 11/23/14 1551  BNP 599.1* 978.4*    ProBNP (last 3 results)  Recent Labs  12/08/13 1502 02/11/14 2256  PROBNP 1417.0* 2409.0*    CBG:  Recent Labs Lab 11/24/14 0906  11/24/14 1227 11/24/14 1628 11/24/14 2136 11/25/14 0550  GLUCAP 157* 125* 190* 164* 148*    Recent Results (from the past 240 hour(s))  MRSA PCR Screening     Status: None   Collection Time: 11/25/14  4:34 AM  Result Value Ref Range Status   MRSA by PCR NEGATIVE NEGATIVE Final    Comment:        The GeneXpert MRSA Assay (FDA approved for NASAL specimens only), is one component of a comprehensive MRSA colonization surveillance program. It is not intended to diagnose MRSA infection nor to guide or monitor treatment for MRSA infections.      Studies: Dg Chest 2 View  11/23/2014   CLINICAL DATA:  Leg swelling.  Cellulitis  EXAM: CHEST  2 VIEW  COMPARISON:  05/21/2014  FINDINGS: Cardiac enlargement. Vascular congestion with mild interstitial edema. Small bilateral pleural effusions have developed since the prior study. Negative for pneumonia.  Lungs are hyperinflated  IMPRESSION: Findings consistent with fluid overload with vascular congestion and small pleural effusions.   Electronically Signed   By: Franchot Gallo M.D.   On: 11/23/2014 16:17   US Abdomen Complete  11/24/2014   CLINICAL DATA:  Abdominal discomfort.  Renal failure.  EXAM: ULTRASOUND ABDOMEN COMPLETE  COMPARISON:  None.  FINDINGS: Gallbladder: Wall Echo Sign is present indicating one large gallstone or multiple small gallstones packing the lumen. Gallbladder wall measurements are inaccurate in this setting but there is no pericholecystic fluid. Sonographic Percell Miller sign is negative.  Common bile duct: Diameter: 1.6 mm diameter.  Liver: No focal lesion identified. Within normal limits in parenchymal echogenicity.  IVC: No abnormality visualized.  Pancreas: Visualized portion unremarkable.  Spleen: Size and appearance within normal limits.  Right Kidney: Length: 10.9 cm. Echogenicity within normal limits. No mass or hydronephrosis visualized.  Left Kidney: Length: 10.5 cm. Echogenicity within normal limits. No mass or  hydronephrosis visualized.  Abdominal aorta: No aneurysm visualized.  Other findings: None.  IMPRESSION: Cholelithiasis without signs of acute cholecystitis. The gallbladder lumen is filled with calculi.   Electronically Signed   By: Rolla Flatten M.D.   On: 11/24/2014 11:57    Scheduled Meds: . atorvastatin  40 mg Oral q1800  . carvedilol  3.125 mg Oral BID WC  . heparin  5,000 Units Subcutaneous 3 times per day  . insulin aspart  0-15 Units Subcutaneous TID WC  . iron dextran (INFED/DEXFERRUM) infusion  25 mg Intravenous Once   Followed by  . iron dextran (INFED/DEXFERRUM) infusion  500 mg Intravenous Once  . isosorbide-hydrALAZINE  1 tablet Oral BID  . metolazone  2.5 mg Oral Once  . sodium chloride  3 mL Intravenous Q12H  Continuous Infusions: . furosemide (LASIX) infusion 8 mg/hr (11/24/14 1223)     Charlynne Cousins  [facility] Pager [phone] If 7PM-7AM, please contact night-coverage at [email or web address], password [facility] 11/25/2014, 9:59 AM  LOS: 2 days

## 2014-11-25 NOTE — Care Management Note (Signed)
    Page 1 of 1   11/29/2014     3:20:14 PM CARE MANAGEMENT NOTE 11/29/2014  Patient:  Downen,Caesar   Account Number:  [id number]  Date Initiated:  11/25/2014  Documentation initiated by:  Natash Berman  Subjective/Objective Assessment:   Pt adm on 11/23/14 with CHF.  PTA, pt resides at home with daughter.     Action/Plan:   PT recommending SNF, though pt resistant to this idea. Will consult CSW to meet with pt/family to discuss.   Anticipated DC Date:  11/29/2014   Anticipated DC Plan:  SKILLED NURSING FACILITY  In-house referral  Clinical Social Worker      DC Planning Services  CM consult      Choice offered to / List presented to:             Status of service:  Completed, signed off Medicare Important Message given?  YES (If response is "NO", the following Medicare IM given date fields will be blank) Date Medicare IM given:  11/28/2014 Medicare IM given by:  Meiling Hendriks Date Additional Medicare IM given:   Additional Medicare IM given by:    Discharge Disposition:  [place]  Per UR Regulation:  Reviewed for med. necessity/level of care/duration of stay  If discussed at [place] of Stay Meetings, dates discussed:   11/29/2014    Comments:  11/29/14 Ellan Lambert, RN, BSN [phone] Pt discharged to SNF today, per CSW arrangements.

## 2014-11-25 NOTE — Evaluation (Signed)
Physical Therapy Evaluation Patient Details Name: [name] MRN: [id number] DOB: [birth year] Today's Date: 11/25/2014   History of Present Illness  Pt adm on 11/23/14 with CHF  Clinical Impression  Patient demonstrates deficits in functional mobility as indicated below. Will need continued skilled PT to address deficits and maximize function. Will see as indicated and progress as tolerated.  OF NOTE: recommend ST SNF, patient adamantly refusing, patient also expressing a great deal of agitation towards home environment and care being provided by family.     Follow Up Recommendations Home health PT;Supervision/Assistance - 24 hour (recommend SNF pt refusing)    Equipment Recommendations  None recommended by PT    Recommendations for Other Services       Precautions / Restrictions Precautions Precautions: Fall Restrictions Weight Bearing Restrictions: No      Mobility  Bed Mobility Overal bed mobility: Needs Assistance Bed Mobility: Supine to Sit;Sit to Supine     Supine to sit: Min assist Sit to supine: Min assist   General bed mobility comments: Min assit for LLE movement and to pull to sitting EOB  Transfers Overall transfer level: Needs assistance Equipment used: Rolling walker (2 wheeled) Transfers: Sit to/from [facility] Sit to Stand: Min assist Stand pivot transfers: Min assist       General transfer comment: Min assist for stability  Ambulation/Gait             General Gait Details: patient states unable to perform at this time, hopeful he will be able to ambulate once LEs are wrapped  Stairs            Wheelchair Mobility    Modified Rankin (Stroke Patients Only)       Balance Overall balance assessment: Needs assistance;History of Falls Sitting-balance support: Feet supported Sitting balance-Leahy Scale: Fair     Standing balance support: Bilateral upper extremity supported;During functional activity Standing  balance-Leahy Scale: Poor                               Pertinent Vitals/Pain Pain Assessment: Faces Faces Pain Scale: Hurts little more Pain Location: LLE, bilateral feet Pain Descriptors / Indicators: Sore;Tightness Pain Intervention(s): Limited activity within patient's tolerance;Monitored during session;Repositioned    Home Living Family/patient expects to be discharged to:: Private residence Living Arrangements: Children   Type of Home: Apartment Home Access: Level entry     Home Layout: One level Home Equipment: Environmental consultant - 4 wheels Additional Comments: patient very adament that he DOES NOTE want to go home with daughter, states that she is just taking his money and does not help him in any way.    Prior Function Level of Independence: Needs assistance   Gait / Transfers Assistance Needed: uses Rollator  ADL's / Homemaking Assistance Needed: reports that he is independent but also states difficulty getting around recently, as well as history of falls        Hand Dominance   Dominant Hand: Right    Extremity/Trunk Assessment               Lower Extremity Assessment: Generalized weakness;RLE deficits/detail;LLE deficits/detail RLE Deficits / Details: increased bilateral LE edema LLE Deficits / Details: increased bilateral LE edema  Cervical / Trunk Assessment:  (increased body habitus)  Communication   Communication: No difficulties  Cognition Arousal/Alertness: Awake/alert Behavior During Therapy: Agitated (related to daughter) Overall Cognitive Status: No family/caregiver present to determine baseline cognitive functioning  General Comments General comments (skin integrity, edema, etc.): Significantly increased BLE edema, L >R, difficulty to mobilize secondary to discomfort and sensory deficits.    Exercises        Assessment/Plan    PT Assessment Patient needs continued PT services  PT Diagnosis  Difficulty walking;Abnormality of gait;Generalized weakness;Acute pain   PT Problem List Decreased strength;Decreased range of motion;Decreased activity tolerance;Decreased balance;Decreased mobility;Decreased coordination;Cardiopulmonary status limiting activity;Obesity  PT Treatment Interventions DME instruction;Gait training;Functional mobility training;Therapeutic activities;Therapeutic exercise;Balance training;Patient/family education   PT Goals (Current goals can be found in the Care Plan section) Acute Rehab PT Goals Patient Stated Goal: to get on a 3 wheel motorcycle PT Goal Formulation: With patient Time For Goal Achievement: 12/09/14 Potential to Achieve Goals: Fair    Frequency Min 3X/week   Barriers to discharge Decreased caregiver support      Co-evaluation               End of Session Equipment Utilized During Treatment: Gait belt Activity Tolerance: Patient limited by fatigue;Patient limited by pain Patient left: in bed;with call bell/phone within reach;with bed alarm set Nurse Communication: Mobility status         Time: 2641-5830 PT Time Calculation (min) (ACUTE ONLY): 17 min   Charges:   PT Evaluation $Initial PT Evaluation Tier I: 1 Procedure     PT G CodesDuncan Dull December 12, 2014, 3:53 PM  Alben Deeds, [place] DPT  [phone]

## 2014-11-25 NOTE — Progress Notes (Signed)
Heart Failure Navigator Consult Note  Presentation: [name] is a 81 year old male with a history of diabetes, hypertension, ischemic cardiomyopathy, narcolepsy, morbid obesity, poor compliance with medications, presents to the ED today with shortness of breath. Patient also has a history of bilateral lower extremity lymphedema. The patient states that he has had worsening swelling for about 2 months. He has also noticed worsening orthopnea, abdominal bloating, worsening dyspnea on exertion, difficulty ambulating, difficulty climbing up the stairs because of fluid retention in his legs. Patient notified his PCP and was recommended to go to the ED on 11/02/14. Patient was found to have 3+ pitting edema in bilateral lower extremities, no warmth or erythema, patient received by mouth Lasix in the ED, and refused IV Lasix. Creatinine has slowly been increasing from a baseline of 1. 5 since 2015. Patient was also found to be hypertensive, but refused admission during his last ED visit.   Past Medical History  Diagnosis Date  . Smoker   . Narcolepsy   . Hypertension   . BPH (benign prostatic hyperplasia)   . Arthritis   . Diabetes mellitus   . Neuropathy in diabetes   . Herpes   . Dyslipidemia   . Obesity   . Lymphedema     LE lymphedemia with hx of cellulitis.  . CKD (chronic kidney disease)   . Anemia   . History of GI bleed 06/2012    EGD unremarkable;  Colo with large cecal polyp (bx benign)  . Ischemic cardiomyopathy     Echo 06/19/12: EF 40-45%, mid to distal anteroseptal HK, moderate LAE, PASP 37  . Chronic systolic heart failure   . CAD (coronary artery disease)     a. probable underlying CAD;  b. Type 2 NSTEMi in setting of profound anemia (Hgb 3.6) from GI bleed in 06/2012 and WMA on echo;  c. med Rx due to CKD and GI bleed  . CHF (congestive heart failure)   . Atrial fibrillation     History   Social History  . Marital Status: Widowed    Spouse Name: N/A  . Number of  Children: N/A  . Years of Education: N/A   Social History Main Topics  . Smoking status: Former Smoker    Quit date: 08/26/1998  . Smokeless tobacco: Never Used  . Alcohol Use: No     Comment: in the past  . Drug Use: No  . Sexual Activity: Not on file   Other Topics Concern  . None   Social History Narrative   Widower-   Has #2 children and #2 grandchildren and a great grandchild   Retired from [employer] (15 years ago)   Currently resides at [facility] [place] with walker-    ECHO: 11/13/13 Study Conclusions  - Left ventricle: The cavity size was normal. Wall thickness was increased in a pattern of mild LVH. Systolic function was normal. The estimated ejection fraction was in the range of 60% to 65%. Wall motion was normal; there were no regional wall motion abnormalities. - Right atrium: The atrium was mildly to moderately dilated. Transthoracic echocardiography. M-mode, complete 2D, spectral Doppler, and color Doppler. Height: Height: 175.3cm. Height: 69in. Weight: Weight: 107.7kg. Weight: 236.9lb. Body mass index: BMI: 35kg/m^2. Body surface area:  BSA: 2.58m^2. Blood pressure:   155/68. Patient status: Inpatient. Location: Bedside.  BNP    Component Value Date/Time   BNP 978.4* 11/23/2014 1551    ProBNP    Component Value Date/Time  PROBNP 2409.0* 02/11/2014 2256     Education Assessment and Provision:  Detailed education and instructions provided on heart failure disease management including the following:  Signs and symptoms of Heart Failure When to call the physician Importance of daily weights Low sodium diet Fluid restriction Medication management Anticipated future follow-up appointments  Patient education given on each of the above topics.   I spoke briefly with [name] about his HF.  He was very difficult to redirect and monopolized the conversation with concerns about his daughter and  money.  He said that she is trying to put him in "a nursing home".  He has a scale however does not weigh daily.  He admits that he does not always take medications as prescribed.  I have reinforced the need and importance for daily weights as well as taking all medications as prescribed.  I am not sure that he understands or has insight into his disease or HF recommendations.  I do feel that he would benefit from SNF -however he seems very resistant to that idea.  Education Materials:  "Living Better With Heart Failure" Booklet, Daily Weight Tracker Tool    High Risk Criteria for Readmission and/or Poor Patient Outcomes:   EF <30%- No 60-65%  2 or more admissions in 6 months- No  Difficult social situation- [place] with daughter ? How that is working  Demonstrates medication noncompliance- yes admits to some med noncompliance    Barriers of Care:  Knowledge and compliance   Discharge Planning:   Plans at this time to return to home with daughter.  I am not sure that environment is the best situation given his conversation with me.

## 2014-11-25 NOTE — Progress Notes (Signed)
Currently patient's CBG -148 and does not want coverage at this time and is requesting for fingerstick to be taken after he eats breakfast and will decide if he wants the insulin per sliding scale at that time. Will continue to monitor patient to end of shift.

## 2014-11-26 LAB — CBC
HCT: 25.7 % — ABNORMAL LOW (ref 39.0–52.0)
Hemoglobin: 7.8 g/dL — ABNORMAL LOW (ref 13.0–17.0)
MCH: 22.3 pg — AB (ref 26.0–34.0)
MCHC: 30.4 g/dL (ref 30.0–36.0)
MCV: 73.6 fL — ABNORMAL LOW (ref 78.0–100.0)
PLATELETS: 251 10*3/uL (ref 150–400)
RBC: 3.49 MIL/uL — AB (ref 4.22–5.81)
RDW: 16.6 % — AB (ref 11.5–15.5)
WBC: 5.1 10*3/uL (ref 4.0–10.5)

## 2014-11-26 LAB — BASIC METABOLIC PANEL
ANION GAP: 5 (ref 5–15)
BUN: 31 mg/dL — ABNORMAL HIGH (ref 6–23)
CO2: 38 mmol/L — ABNORMAL HIGH (ref 19–32)
Calcium: 8.9 mg/dL (ref 8.4–10.5)
Chloride: 92 mmol/L — ABNORMAL LOW (ref 96–112)
Creatinine, Ser: 2.23 mg/dL — ABNORMAL HIGH (ref 0.50–1.35)
GFR calc Af Amer: 31 mL/min — ABNORMAL LOW (ref >90)
GFR, EST NON AFRICAN AMERICAN: 27 mL/min — AB (ref >90)
GLUCOSE: 149 mg/dL — AB (ref 70–99)
Potassium: 3.9 mmol/L (ref 3.5–5.1)
SODIUM: 135 mmol/L (ref 135–145)

## 2014-11-26 LAB — GLUCOSE, CAPILLARY
GLUCOSE-CAPILLARY: 158 mg/dL — AB (ref 70–99)
GLUCOSE-CAPILLARY: 218 mg/dL — AB (ref 70–99)
GLUCOSE-CAPILLARY: 198 mg/dL — AB (ref 70–99)
Glucose-Capillary: 172 mg/dL — ABNORMAL HIGH (ref 70–99)

## 2014-11-26 MED ORDER — FUROSEMIDE 10 MG/ML IJ SOLN
10.0000 mg/h | INTRAVENOUS | Status: DC
  Administered 2014-11-26: 10 mg/h via INTRAVENOUS
  Filled 2014-11-26: qty 25

## 2014-11-26 MED ORDER — POLYETHYLENE GLYCOL 3350 17 G PO PACK
17.0000 g | PACK | Freq: Every day | ORAL | Status: AC
  Filled 2014-11-26 (×2): qty 1

## 2014-11-26 NOTE — Progress Notes (Signed)
Pt converted to atrial flutter, pt resting comfortably with no complaints.  MD text/paged, will continue to monitor.

## 2014-11-26 NOTE — Progress Notes (Signed)
Pt unable to tolerate ACE wraps and requesting to have them off.  ACE wraps off, will continue to monitor.

## 2014-11-26 NOTE — Progress Notes (Signed)
[facility] PROGRESS NOTE Interim History: 81 year old male with past medical history of diabetes, hypertension, ischemic cardiomyopathy and morbid obesity that comes in to emergency room complaining of shortness of breath. She relates she's had worsening lower extremity swelling for the past 2 months which has progressively gotten worse, she's also had some orthopnea and abdominal discomfort and dyspnea on exertion. Filed Weights   11/24/14 0509 11/25/14 0441 11/26/14 0454  Weight: 125.329 kg (276 lb 4.8 oz) 121.2 kg (267 lb 3.2 oz) 111.993 kg (246 lb 14.4 oz)        Intake/Output Summary (Last 24 hours) at 11/26/14 3846 Last data filed at 11/26/14 0908  Gross per 24 hour  Intake    880 ml  Output   6425 ml  Net  -5545 ml     Assessment/Plan: Acute diastolic heart failure, NYHA class 2: - Most likely due to noncompliance of medications and fluid restriction. - Last echo on 11/13/2013 showed grade 1 diastolic heart failure. - Mild increase in Cr, con IV lasix in gtt, good UOP. Recheck b-met in am. - 2-D echo as below. - No ACE inhibitor or ARB at this point due to chronic renal disease.  CKD (chronic kidney disease), stage III - Unknown baseline creatinine, continue to trend.  Hyperlipemia - Check a fasting lipid panel start Lipitor.  Obesity: - Counseling will need a CT study as an outpatient.  Microcytic anemia: - His RDW was also high, Ferritin of 15, received IV iron. - FOBT pending  Essential hypertension: - bp improving slowly - Currently on Lasix, will add BiDil. - monitor   History of carcinoma of the cecum: - Follow-up with oncology as an outpatient:  Code Status: full Family Communication: none  Disposition Plan: inpatient   Consultants:  none  Procedures: ECHO: Systolic function was normal. The estimated ejection fraction was in the range of 60% to 65%. Wall motion was normal; there were no regional wall motion abnormalities.  Features are consistent with a pseudonormal left ventricular filling pattern, with concomitant abnormal relaxation and increased filling pressure (grade 2 diastolic dysfunction).  Antibiotics:  None  HPI/Subjective: Shortness of breath is slightly improved, has no complains  Objective: Filed Vitals:   11/24/14 2046 11/25/14 0441 11/25/14 2114 11/26/14 0454  BP: 152/63 166/69 157/63 156/63  Pulse: 71 75 70 71  Temp: 98.2 F (36.8 C) 98.1 F (36.7 C) 99.1 F (37.3 C) 98.8 F (37.1 C)  TempSrc: Axillary Oral Oral Oral  Resp: 18 18 18 18   Height:      Weight:  121.2 kg (267 lb 3.2 oz)  111.993 kg (246 lb 14.4 oz)  SpO2: 97% 98% 99% 98%     Exam:  General: Alert, awake, oriented x3, in no acute distress.  HEENT: No bruits, no goiter. Positive JVD Heart: Regular rate and rhythm, 3+ edema. Lungs: Good air movement,clear Abdomen: Soft, nontender, nondistended, positive bowel sounds.  Neuro: Grossly intact, nonfocal.   Data Reviewed: Basic Metabolic Panel:  Recent Labs Lab 11/23/14 1551 11/23/14 1608 11/23/14 1955 11/24/14 0500 11/26/14 0345  NA 137 140  --  138 135  K 4.3 4.4  --  4.4 3.9  CL 103 101  --  102 92*  CO2 24  --   --  28 38*  GLUCOSE 197* 200*  --  167* 149*  BUN 27* 28*  --  27* 31*  CREATININE 1.81* 1.90* 1.86* 1.84* 2.23*  CALCIUM 7.9*  --   --  8.2* 8.9  MG 1.7  --  1.4*  --   --    Liver Function Tests:  Recent Labs Lab 11/23/14 1955 11/24/14 0500  AST 9 8  ALT 10 7  ALKPHOS 67 57  BILITOT 0.6 0.5  PROT 7.0 5.9*  ALBUMIN 3.2* 2.8*   No results for input(s): LIPASE, AMYLASE in the last 168 hours. No results for input(s): AMMONIA in the last 168 hours. CBC:  Recent Labs Lab 11/23/14 1551 11/23/14 1608 11/23/14 1955 11/24/14 0500 11/26/14 0345  WBC 4.8  --  5.5 5.0 5.1  NEUTROABS 2.4  --   --   --   --   HGB 7.6* 9.5* 8.2* 7.2* 7.8*  HCT 25.4* 28.0* 27.8* 24.4* 25.7*  MCV 76.3*  --  77.0* 76.0* 73.6*  PLT 233  --  278  248 251   Cardiac Enzymes:  Recent Labs Lab 11/23/14 1955 11/23/14 2345 11/24/14 0635  TROPONINI 0.04* 0.03 0.04*   BNP (last 3 results)  Recent Labs  10/20/14 1503 11/23/14 1551  BNP 599.1* 978.4*    ProBNP (last 3 results)  Recent Labs  12/08/13 1502 02/11/14 2256  PROBNP 1417.0* 2409.0*    CBG:  Recent Labs Lab 11/25/14 0550 11/25/14 1115 11/25/14 1622 11/25/14 2117 11/26/14 0626  GLUCAP 148* 215* 192* 161* 158*    Recent Results (from the past 240 hour(s))  MRSA PCR Screening     Status: None   Collection Time: 11/25/14  4:34 AM  Result Value Ref Range Status   MRSA by PCR NEGATIVE NEGATIVE Final    Comment:        The GeneXpert MRSA Assay (FDA approved for NASAL specimens only), is one component of a comprehensive MRSA colonization surveillance program. It is not intended to diagnose MRSA infection nor to guide or monitor treatment for MRSA infections.      Studies: US Abdomen Complete  11/24/2014   CLINICAL DATA:  Abdominal discomfort.  Renal failure.  EXAM: ULTRASOUND ABDOMEN COMPLETE  COMPARISON:  None.  FINDINGS: Gallbladder: Wall Echo Sign is present indicating one large gallstone or multiple small gallstones packing the lumen. Gallbladder wall measurements are inaccurate in this setting but there is no pericholecystic fluid. Sonographic Percell Miller sign is negative.  Common bile duct: Diameter: 1.6 mm diameter.  Liver: No focal lesion identified. Within normal limits in parenchymal echogenicity.  IVC: No abnormality visualized.  Pancreas: Visualized portion unremarkable.  Spleen: Size and appearance within normal limits.  Right Kidney: Length: 10.9 cm. Echogenicity within normal limits. No mass or hydronephrosis visualized.  Left Kidney: Length: 10.5 cm. Echogenicity within normal limits. No mass or hydronephrosis visualized.  Abdominal aorta: No aneurysm visualized.  Other findings: None.  IMPRESSION: Cholelithiasis without signs of acute  cholecystitis. The gallbladder lumen is filled with calculi.   Electronically Signed   By: Rolla Flatten M.D.   On: 11/24/2014 11:57    Scheduled Meds: . atorvastatin  40 mg Oral q1800  . carvedilol  3.125 mg Oral BID WC  . heparin  5,000 Units Subcutaneous 3 times per day  . insulin aspart  0-15 Units Subcutaneous TID WC  . iron dextran (INFED/DEXFERRUM) infusion  500 mg Intravenous Once  . isosorbide-hydrALAZINE  1 tablet Oral BID  . sodium chloride  3 mL Intravenous Q12H   Continuous Infusions:     Charlynne Cousins  [facility] Pager [phone] If 7PM-7AM, please contact night-coverage at [email or web address], password [facility] 11/26/2014, 9:49 AM  LOS: 3 days

## 2014-11-26 NOTE — Progress Notes (Signed)
Spoke with pt regarding PICC placement.  Ronnald Nian RN spoke with pt yesterday, he refused.  He continues to refuse placement.  States "It is going to hurt".  Reinforced procedure to pt.  Continues to refuse.  States" If it comes time I have to have it, I will buckle down and do it, but for now, No"  Current PIV working well. Dr. Aileen Fass notified.

## 2014-11-27 LAB — GLUCOSE, CAPILLARY
GLUCOSE-CAPILLARY: 182 mg/dL — AB (ref 70–99)
Glucose-Capillary: 135 mg/dL — ABNORMAL HIGH (ref 70–99)
Glucose-Capillary: 180 mg/dL — ABNORMAL HIGH (ref 70–99)
Glucose-Capillary: 161 mg/dL — ABNORMAL HIGH (ref 70–99)

## 2014-11-27 MED ORDER — ISOSORB DINITRATE-HYDRALAZINE 20-37.5 MG PO TABS
2.0000 | ORAL_TABLET | Freq: Three times a day (TID) | ORAL | Status: DC
  Administered 2014-11-27 – 2014-11-28 (×6): 2 via ORAL
  Filled 2014-11-27 (×9): qty 2

## 2014-11-27 NOTE — Progress Notes (Signed)
[facility] PROGRESS NOTE Interim History: 81 year old male with past medical history of diabetes, hypertension, ischemic cardiomyopathy and morbid obesity that comes in to emergency room complaining of shortness of breath. She relates she's had worsening lower extremity swelling for the past 2 months which has progressively gotten worse, she's also had some orthopnea and abdominal discomfort and dyspnea on exertion. Filed Weights   11/25/14 0441 11/26/14 0454 11/27/14 0441  Weight: 121.2 kg (267 lb 3.2 oz) 111.993 kg (246 lb 14.4 oz) 108.818 kg (239 lb 14.4 oz)        Intake/Output Summary (Last 24 hours) at 11/27/14 0849 Last data filed at 11/27/14 0630  Gross per 24 hour  Intake    960 ml  Output   3200 ml  Net  -2240 ml     Assessment/Plan: Acute diastolic heart failure, NYHA class 2: - Most likely due to noncompliance of medications and fluid restriction. - EKG shows atrial fibrillation with slow ventricular rate 1 11/26/2014, Now in sinus rhythm on telemetry strip recheck a 12-lead EKG.  - mild increase in creatinine 1 11/26/2014, so Lasix was held. Basic metabolic panels pending this morning.  He diuresed about 4.4 L without any diuretics. - 2-D echo as below. - No ACE inhibitor or ARB at this point due to chronic renal disease. - Apply Ace bandages to lower extremities.  CKD (chronic kidney disease), stage III - Unknown baseline creatinine, continue to trend.  Hyperlipemia - Check a fasting lipid panel start Lipitor.  Obesity: - Counseling will need a CT study as an outpatient.  Microcytic anemia: - His RDW was also high, Ferritin of 15, received IV iron. - Fecal "blood test is negative.   Essential hypertension: - Blood pressure continues to be elevated, we'll continue to titrate antihypertensive medications. - monitor   History of carcinoma of the cecum: - Follow-up with oncology as an outpatient:  Code Status: full Family Communication: none    Disposition Plan: inpatient   Consultants:  none  Procedures: ECHO: Systolic function was normal. The estimated ejection fraction was in the range of 60% to 65%. Wall motion was normal; there were no regional wall motion abnormalities. Features are consistent with a pseudonormal left ventricular filling pattern, with concomitant abnormal relaxation and increased filling pressure (grade 2 diastolic dysfunction).  Antibiotics:  None  HPI/Subjective: Shortness of breath is resolved he is able to lay flat to sleep.   Objective: Filed Vitals:   11/26/14 1607 11/26/14 2029 11/27/14 0441 11/27/14 0645  BP: 132/43 142/50 158/67 155/74  Pulse: 61 55 80 74  Temp:  98.1 F (36.7 C) 98.1 F (36.7 C)   TempSrc:  Oral Oral   Resp:  18 18 20   Height:      Weight:   108.818 kg (239 lb 14.4 oz)   SpO2:  96% 99% 98%     Exam:  General: Alert, awake, oriented x3, in no acute distress.  HEENT: No bruits, no goiter.  Heart: Regular rate and rhythm, 3+ edema. Lungs: Good air movement,clear Abdomen: Soft, nontender, nondistended, positive bowel sounds.  Neuro: Grossly intact, nonfocal.   Data Reviewed: Basic Metabolic Panel:  Recent Labs Lab 11/23/14 1551 11/23/14 1608 11/23/14 1955 11/24/14 0500 11/26/14 0345  NA 137 140  --  138 135  K 4.3 4.4  --  4.4 3.9  CL 103 101  --  102 92*  CO2 24  --   --  28 38*  GLUCOSE 197* 200*  --  167*  149*  BUN 27* 28*  --  27* 31*  CREATININE 1.81* 1.90* 1.86* 1.84* 2.23*  CALCIUM 7.9*  --   --  8.2* 8.9  MG 1.7  --  1.4*  --   --    Liver Function Tests:  Recent Labs Lab 11/23/14 1955 11/24/14 0500  AST 9 8  ALT 10 7  ALKPHOS 67 57  BILITOT 0.6 0.5  PROT 7.0 5.9*  ALBUMIN 3.2* 2.8*   No results for input(s): LIPASE, AMYLASE in the last 168 hours. No results for input(s): AMMONIA in the last 168 hours. CBC:  Recent Labs Lab 11/23/14 1551 11/23/14 1608 11/23/14 1955 11/24/14 0500 11/26/14 0345  WBC 4.8   --  5.5 5.0 5.1  NEUTROABS 2.4  --   --   --   --   HGB 7.6* 9.5* 8.2* 7.2* 7.8*  HCT 25.4* 28.0* 27.8* 24.4* 25.7*  MCV 76.3*  --  77.0* 76.0* 73.6*  PLT 233  --  278 248 251   Cardiac Enzymes:  Recent Labs Lab 11/23/14 1955 11/23/14 2345 11/24/14 0635  TROPONINI 0.04* 0.03 0.04*   BNP (last 3 results)  Recent Labs  10/20/14 1503 11/23/14 1551  BNP 599.1* 978.4*    ProBNP (last 3 results)  Recent Labs  12/08/13 1502 02/11/14 2256  PROBNP 1417.0* 2409.0*    CBG:  Recent Labs Lab 11/26/14 0626 11/26/14 1130 11/26/14 1627 11/26/14 2111 11/27/14 0628  GLUCAP 158* 172* 218* 198* 135*    Recent Results (from the past 240 hour(s))  MRSA PCR Screening     Status: None   Collection Time: 11/25/14  4:34 AM  Result Value Ref Range Status   MRSA by PCR NEGATIVE NEGATIVE Final    Comment:        The GeneXpert MRSA Assay (FDA approved for NASAL specimens only), is one component of a comprehensive MRSA colonization surveillance program. It is not intended to diagnose MRSA infection nor to guide or monitor treatment for MRSA infections.      Studies: No results found.  Scheduled Meds: . atorvastatin  40 mg Oral q1800  . carvedilol  3.125 mg Oral BID WC  . heparin  5,000 Units Subcutaneous 3 times per day  . insulin aspart  0-15 Units Subcutaneous TID WC  . isosorbide-hydrALAZINE  1 tablet Oral BID  . polyethylene glycol  17 g Oral Daily  . sodium chloride  3 mL Intravenous Q12H   Continuous Infusions:     Charlynne Cousins  [facility] Pager [phone] If 7PM-7AM, please contact night-coverage at [email or web address], password [facility] 11/27/2014, 8:49 AM  LOS: 4 days

## 2014-11-27 NOTE — Progress Notes (Signed)
Pt's HR down to 37 nonsustained, pt resting in bed no c/o pain. Fredirick Maudlin NP notified. No new orders made at this time. Will continue to monitor.

## 2014-11-28 ENCOUNTER — Inpatient Hospital Stay (HOSPITAL_COMMUNITY): Payer: Medicare Other

## 2014-11-28 DIAGNOSIS — R600 Localized edema: Secondary | ICD-10-CM

## 2014-11-28 DIAGNOSIS — N179 Acute kidney failure, unspecified: Secondary | ICD-10-CM

## 2014-11-28 LAB — BASIC METABOLIC PANEL
ANION GAP: 10 (ref 5–15)
BUN: 30 mg/dL — ABNORMAL HIGH (ref 6–23)
CHLORIDE: 94 mmol/L — AB (ref 96–112)
CO2: 33 mmol/L — AB (ref 19–32)
Calcium: 8 mg/dL — ABNORMAL LOW (ref 8.4–10.5)
Creatinine, Ser: 2.2 mg/dL — ABNORMAL HIGH (ref 0.50–1.35)
GFR calc Af Amer: 31 mL/min — ABNORMAL LOW (ref >90)
GFR calc non Af Amer: 27 mL/min — ABNORMAL LOW (ref >90)
Glucose, Bld: 136 mg/dL — ABNORMAL HIGH (ref 70–99)
Potassium: 3.7 mmol/L (ref 3.5–5.1)
Sodium: 137 mmol/L (ref 135–145)

## 2014-11-28 LAB — GLUCOSE, CAPILLARY
GLUCOSE-CAPILLARY: 132 mg/dL — AB (ref 70–99)
GLUCOSE-CAPILLARY: 140 mg/dL — AB (ref 70–99)
GLUCOSE-CAPILLARY: 149 mg/dL — AB (ref 70–99)
Glucose-Capillary: 184 mg/dL — ABNORMAL HIGH (ref 70–99)
Glucose-Capillary: 196 mg/dL — ABNORMAL HIGH (ref 70–99)

## 2014-11-28 MED ORDER — FUROSEMIDE 10 MG/ML IJ SOLN
40.0000 mg | Freq: Two times a day (BID) | INTRAMUSCULAR | Status: DC
  Administered 2014-11-28 – 2014-11-29 (×2): 40 mg via INTRAVENOUS
  Filled 2014-11-28 (×3): qty 4

## 2014-11-28 MED ORDER — INSULIN GLARGINE 100 UNIT/ML ~~LOC~~ SOLN
12.0000 [IU] | Freq: Every day | SUBCUTANEOUS | Status: DC
  Administered 2014-11-28: 12 [IU] via SUBCUTANEOUS
  Filled 2014-11-28 (×2): qty 0.12

## 2014-11-28 MED ORDER — FERROUS SULFATE 325 (65 FE) MG PO TABS
325.0000 mg | ORAL_TABLET | Freq: Two times a day (BID) | ORAL | Status: DC
  Administered 2014-11-28 – 2014-11-29 (×2): 325 mg via ORAL
  Filled 2014-11-28 (×4): qty 1

## 2014-11-28 NOTE — Progress Notes (Signed)
Rehab Admissions Coordinator Note:  Patient was screened by Cleatrice Burke for appropriateness for an Inpatient Acute Rehab Consult per PT recommendation.  At this time, we are recommending await further progress with therapy for pt has not demonstrated tolerance yet for more intense therapies required for an inpt rehab admission Aslo recommend OT eval.. I will follow his progress.  Cleatrice Burke 11/28/2014, 1:53 PM  I can be reached at [phone].

## 2014-11-28 NOTE — Progress Notes (Signed)
Physical Therapy Treatment Patient Details Name: [name] MRN: [id number] DOB: 1934/03/27 Today's Date: 11/28/2014    History of Present Illness Pt adm on 11/23/14 with CHF    PT Comments    Participating more today compared to last PT session, though still quite fatigued ("I didn't sleep any last night"); He talked a lot about his love of golf, how strong he sued to be, and his goal of purchasing a 3wheeled motorcyle and riding at least a season; I continue to recommend post-acute rehab for consistent, daily therapy and practice to maximize independence and safety with mobility and strength before going home;   Given he is motivated, I think it is worth considering CIR; Have placed a screen to get Rehab Admission Coordinator's opinion;   Follow Up Recommendations  CIR;Supervision/Assistance - 24 hour     Equipment Recommendations  None recommended by PT    Recommendations for Other Services Rehab consult;OT consult     Precautions / Restrictions Precautions Precautions: Fall    Mobility  Bed Mobility Overal bed mobility: Needs Assistance Bed Mobility: Supine to Sit;Sit to Supine     Supine to sit: Min assist Sit to supine: Min assist   General bed mobility comments: Min assit for LLE movement and to pull to sitting EOB  Transfers Overall transfer level: Needs assistance Equipment used: Rolling walker (2 wheeled) Transfers: Sit to/from Stand Sit to Stand: Min guard         General transfer comment: Close guard for safety, but not needing physical assist; Showing good control with stand to sit  Ambulation/Gait Ambulation/Gait assistance: Min guard Ambulation Distance (Feet):  (Sidesteps at EOB to [name]) Assistive device: Rolling walker (2 wheeled) Gait Pattern/deviations: Shuffle     General Gait Details: Good use of RW for safety, heavy reliance on UE support; decr amb distance due to pt very much wanting to lay back down and sleep; States he will be  happy to walk later today   Stairs            Wheelchair Mobility    Modified Rankin (Stroke Patients Only)       Balance     Sitting balance-Leahy Scale: Fair       Standing balance-Leahy Scale: Poor                      Cognition Arousal/Alertness: Awake/alert Behavior During Therapy: WFL for tasks assessed/performed Overall Cognitive Status: No family/caregiver present to determine baseline cognitive functioning                      Exercises      General Comments General comments (skin integrity, edema, etc.): Significantly increased BLE edema, L >R, difficulty to mobilize secondary to discomfort and sensory deficits.   Per Central Cardiac Monitoring, pt in a-fib during session; rate in 70s      Pertinent Vitals/Pain Pain Assessment: No/denies pain    Home Living                      Prior Function            PT Goals (current goals can now be found in the care plan section) Acute Rehab PT Goals Patient Stated Goal: to get on a 3 wheel motorcycle; he wants to ride for a season before he dies PT Goal Formulation: With patient Time For Goal Achievement: 12/09/14 Potential to Achieve Goals: Fair Progress towards PT goals: Progressing toward  goals (slowly)    Frequency  Min 3X/week    PT Plan Discharge plan needs to be updated    Co-evaluation             End of Session Equipment Utilized During Treatment: Gait belt Activity Tolerance: Patient tolerated treatment well;Patient limited by fatigue Patient left: in bed;with call bell/phone within reach;with bed alarm set     Time: 6773-7366 PT Time Calculation (min) (ACUTE ONLY): 15 min  Charges:  $Therapeutic Activity: 8-22 mins                    G Codes:      [name] 11/28/2014, 10:20 AM  [name], PT  Acute Rehabilitation Services Pager [phone] Office [phone]

## 2014-11-28 NOTE — Progress Notes (Signed)
[facility] PROGRESS NOTE  Raul Torrance DVV:[id number] DOB: September 25, 1933 DOA: 11/23/2014 PCP: Philis Fendt, MD  brief narrative 81 year old male with past medical history of diabetes, hypertension, ischemic cardiomyopathy and morbid obesity presented to  emergency room complaining of shortness of breath. Also reported progressive worsening of lower extremity swelling for the past 2 months.  also had some orthopnea and abdominal discomfort with  dyspnea on exertion. Patient admitted for acute diastolic CHF.  Assessment/Plan: Acute diastolic CHF NYHA class II Likely associated with medication noncompliance and dietary nonadherence Was started on IV diuretics but held as his renal function worsened. Has been net negative of 16.5 L since admission. Still has bilateral leg edema. I will place him back on IV Lasix 40 mg twice daily for another day. 2-D echo showing normal EF with diastolic dysfunction. Added Coreg, BiDil and Lipitor. Blood pressure well controlled.  Acute on chronic kidney disease Baseline creatinine of around 1.4 about 6 months back and seems to be progressively worsening. Ultrasound abdomen showing medical renal disease. Renal ultrasound showed decrease admission in the medial mid to lower pole region of the left kidney which appears more prominent with some concern for hypoechoic mass in the area. Recommends renal MR to further evaluate. Would recommend workup as outpatient. Avoiding nephrotoxins.  iron deficiency anemia Equivocal occult blood was negative. Will add iron supplement.  Uncontrolled type 2 diabetes mellitus A1c of 8.4. On sliding scale insulin. Not on any medications. Will add bedtime lantus . Continue SSI  Essential hypertension Blood pressure medications adjusted  History of carcinoma of the cecum Follow-up with oncology as outpatient  Diet: Heart healthy/diabetic  DVT prophylaxis: Subcutaneous heparin  Code Status: FULL Family  Communication: none at bedside Disposition: PT recommends CIR with evaluated the patient and thought he was not appropriate for it. I think he would benefit from going to skilled nursing facility or need of PT and ensure that he is taking his medications and diet appropriately. He has very poor understanding of his disease. Patient refuses to follow-up with his PCP   Consultants: None  Procedures 2-D echo  Antibiotics:  none  HPI/ patient seen and examined. Reports his breathing to be better. Shows his anger towards his daughter saying she is after his money and wants to send him to nursing home.  Filed Vitals:   11/28/14 1423  BP: 122/45  Pulse: 76  Temp: 97.4 F (36.3 C)  Resp: 18    Intake/Output Summary (Last 24 hours) at 11/28/14 1616 Last data filed at 11/28/14 1423  Gross per 24 hour  Intake   1080 ml  Output   1450 ml  Net   -370 ml   Filed Weights   11/26/14 0454 11/27/14 0441 11/28/14 0631  Weight: 111.993 kg (246 lb 14.4 oz) 108.818 kg (239 lb 14.4 oz) 108.092 kg (238 lb 4.8 oz)    Exam:   General:  Elderly male in no acute distress  HEENT: Pallor present, moist oral mucosa, no JVD  Chest: Clear to auscultation bilaterally, no added sounds  CVS: Normal S1 and S2, no murmurs rub or gallop  GI: soft, ND, NT, BS+  Musculoskeletal: Warm, 2+ pitting edema bilaterally, tender to pressure  CNS: AAOX2   Data Reviewed: Basic Metabolic Panel:  Recent Labs Lab 11/23/14 1551 11/23/14 1608 11/23/14 1955 11/24/14 0500 11/26/14 0345 11/28/14 0520  NA 137 140  --  138 135 137  K 4.3 4.4  --  4.4 3.9 3.7  CL 103 101  --  102 92* 94*  CO2 24  --   --  28 38* 33*  GLUCOSE 197* 200*  --  167* 149* 136*  BUN 27* 28*  --  27* 31* 30*  CREATININE 1.81* 1.90* 1.86* 1.84* 2.23* 2.20*  CALCIUM 7.9*  --   --  8.2* 8.9 8.0*  MG 1.7  --  1.4*  --   --   --    Liver Function Tests:  Recent Labs Lab 11/23/14 1955 11/24/14 0500  AST 9 8  ALT 10 7   ALKPHOS 67 57  BILITOT 0.6 0.5  PROT 7.0 5.9*  ALBUMIN 3.2* 2.8*   No results for input(s): LIPASE, AMYLASE in the last 168 hours. No results for input(s): AMMONIA in the last 168 hours. CBC:  Recent Labs Lab 11/23/14 1551 11/23/14 1608 11/23/14 1955 11/24/14 0500 11/26/14 0345  WBC 4.8  --  5.5 5.0 5.1  NEUTROABS 2.4  --   --   --   --   HGB 7.6* 9.5* 8.2* 7.2* 7.8*  HCT 25.4* 28.0* 27.8* 24.4* 25.7*  MCV 76.3*  --  77.0* 76.0* 73.6*  PLT 233  --  278 248 251   Cardiac Enzymes:  Recent Labs Lab 11/23/14 1955 11/23/14 2345 11/24/14 0635  TROPONINI 0.04* 0.03 0.04*   BNP (last 3 results)  Recent Labs  10/20/14 1503 11/23/14 1551  BNP 599.1* 978.4*    ProBNP (last 3 results)  Recent Labs  12/08/13 1502 02/11/14 2256  PROBNP 1417.0* 2409.0*    CBG:  Recent Labs Lab 11/27/14 1140 11/27/14 1632 11/27/14 2122 11/28/14 0612 11/28/14 1129  GLUCAP 180* 161* 182* 132* 184*    Recent Results (from the past 240 hour(s))  MRSA PCR Screening     Status: None   Collection Time: 11/25/14  4:34 AM  Result Value Ref Range Status   MRSA by PCR NEGATIVE NEGATIVE Final    Comment:        The GeneXpert MRSA Assay (FDA approved for NASAL specimens only), is one component of a comprehensive MRSA colonization surveillance program. It is not intended to diagnose MRSA infection nor to guide or monitor treatment for MRSA infections.      Studies: US Renal  11/28/2014   CLINICAL DATA:  Acute renal insufficiency  EXAM: RENAL/URINARY TRACT ULTRASOUND COMPLETE  COMPARISON:  Abdominal ultrasound November 24, 2014  FINDINGS: Right Kidney:  Length: 11.3 cm. Echogenicity within normal limits. There is renal cortical thinning, however. No mass, perinephric fluid, or hydronephrosis visualized. No sonographically demonstrable calculus or ureterectasis.  Left Kidney:  Length: 11.7 cm. Echogenicity within normal limits. Renal cortical thickness is low normal. No perinephric  fluid or hydronephrosis visualized. There is an area of decreased attenuation in the medial mid left kidney which appears hypoechoic currently, a change from study 5 days prior. This area measures 3.1 x 2.1 cm. No other potential masses identified on the left. No sonographically demonstrable calculus or ureterectasis.  Bladder:  Appears normal for degree of bladder distention.  IMPRESSION: There is renal cortical thinning on the right and borderline thinning on the left, findings that may be indicative of medical renal disease. Renal echogenicity is normal bilaterally. No obstructing foci identified on either side.  There is an area of decreased attenuation in the medial mid to lower pole region on the left kidney which appears more prominent than on study 5 days prior. The significance of this difference is frankly uncertain. The appearance on the current examination does raise concern  for a hypoechoic mass in this area. Given this concern, correlation with renal MR is felt to be advisable for further assessment.  Study otherwise unremarkable.   Electronically Signed   By: Lowella Grip III M.D.   On: 11/28/2014 10:44    Scheduled Meds: . atorvastatin  40 mg Oral q1800  . carvedilol  3.125 mg Oral BID WC  . furosemide  40 mg Intravenous BID  . heparin  5,000 Units Subcutaneous 3 times per day  . insulin aspart  0-15 Units Subcutaneous TID WC  . isosorbide-hydrALAZINE  2 tablet Oral TID  . sodium chloride  3 mL Intravenous Q12H   Continuous Infusions:     Time spent: 25 minutes    Ziza Hastings  [facility] Pager [phone] 7PM-7AM, please contact night-coverage at [email or web address], password [facility] 11/28/2014, 4:16 PM  LOS: 5 days

## 2014-11-29 DIAGNOSIS — N183 Chronic kidney disease, stage 3 unspecified: Secondary | ICD-10-CM | POA: Diagnosis present

## 2014-11-29 DIAGNOSIS — E1322 Other specified diabetes mellitus with diabetic chronic kidney disease: Secondary | ICD-10-CM

## 2014-11-29 DIAGNOSIS — E1365 Other specified diabetes mellitus with hyperglycemia: Secondary | ICD-10-CM

## 2014-11-29 DIAGNOSIS — N179 Acute kidney failure, unspecified: Secondary | ICD-10-CM | POA: Diagnosis present

## 2014-11-29 LAB — BASIC METABOLIC PANEL
ANION GAP: 7 (ref 5–15)
BUN: 33 mg/dL — ABNORMAL HIGH (ref 6–23)
CALCIUM: 7.9 mg/dL — AB (ref 8.4–10.5)
CHLORIDE: 94 mmol/L — AB (ref 96–112)
CO2: 31 mmol/L (ref 19–32)
Creatinine, Ser: 2.24 mg/dL — ABNORMAL HIGH (ref 0.50–1.35)
GFR calc non Af Amer: 26 mL/min — ABNORMAL LOW (ref >90)
GFR, EST AFRICAN AMERICAN: 31 mL/min — AB (ref >90)
Glucose, Bld: 141 mg/dL — ABNORMAL HIGH (ref 70–99)
Potassium: 3.8 mmol/L (ref 3.5–5.1)
Sodium: 132 mmol/L — ABNORMAL LOW (ref 135–145)

## 2014-11-29 LAB — GLUCOSE, CAPILLARY
Glucose-Capillary: 135 mg/dL — ABNORMAL HIGH (ref 70–99)
Glucose-Capillary: 134 mg/dL — ABNORMAL HIGH (ref 70–99)

## 2014-11-29 MED ORDER — ASPIRIN 81 MG PO TABS: 81.0000 mg | ORAL_TABLET | Freq: Every day | ORAL | Status: DC

## 2014-11-29 MED ORDER — ISOSORB DINITRATE-HYDRALAZINE 20-37.5 MG PO TABS: 2.0000 | ORAL_TABLET | Freq: Three times a day (TID) | ORAL | Status: DC

## 2014-11-29 MED ORDER — GLIMEPIRIDE 2 MG PO TABS: 2.0000 mg | ORAL_TABLET | Freq: Every day | ORAL | Status: DC

## 2014-11-29 MED ORDER — LEVALBUTEROL HCL 0.63 MG/3ML IN NEBU: 0.6300 mg | INHALATION_SOLUTION | Freq: Four times a day (QID) | RESPIRATORY_TRACT | Status: DC | PRN

## 2014-11-29 MED ORDER — INSULIN GLARGINE 100 UNIT/ML ~~LOC~~ SOLN: 10.0000 [IU] | Freq: Every day | SUBCUTANEOUS | Status: DC

## 2014-11-29 MED ORDER — FERROUS SULFATE 325 (65 FE) MG PO TABS: 325.0000 mg | ORAL_TABLET | Freq: Two times a day (BID) | ORAL | Status: DC

## 2014-11-29 MED ORDER — CARVEDILOL 3.125 MG PO TABS: 3.1250 mg | ORAL_TABLET | Freq: Two times a day (BID) | ORAL | Status: DC

## 2014-11-29 MED ORDER — INSULIN ASPART 100 UNIT/ML ~~LOC~~ SOLN: 0.0000 [IU] | Freq: Three times a day (TID) | SUBCUTANEOUS | Status: DC

## 2014-11-29 NOTE — Clinical Social Work Placement (Signed)
Clinical Social Work Department CLINICAL SOCIAL WORK PLACEMENT NOTE 11/25/2014  Patient:  Babiarz,Merland  Account Number:  [id number] Admit date:  11/23/2014  Clinical Social Worker:  Javonte Elenes, LCSWA  Date/time:  11/25/2014 08:46 AM  Clinical Social Work is seeking post-discharge placement for this patient at the following level of care:   SKILLED NURSING   (*CSW will update this form in Epic as items are completed)   11/25/2014  Patient/family provided with [facility] Department of Clinical Social Work's list of facilities offering this level of care within the geographic area requested by the patient (or if unable, by the patient's family).  11/25/2014  Patient/family informed of their freedom to choose among providers that offer the needed level of care, that participate in Medicare, Medicaid or managed care program needed by the patient, have an available bed and are willing to accept the patient.  11/25/2014  Patient/family informed of [facility]' ownership interest in [facility] as well as of the fact that they are under no obligation to receive care at this facility.  PASARR submitted to EDS on  PASARR number received on   FL2 transmitted to all facilities in geographic area requested by pt/family on   FL2 transmitted to all facilities within larger geographic area on   Patient informed that his/her managed care company has contracts with or will negotiate with  certain facilities, including the following:     Patient/family informed of bed offers received:   Patient chooses bed at  Physician recommends and patient chooses bed at    Patient to be transferred to  on   Patient to be transferred to facility by  Patient and family notified of transfer on  Name of family member notified:    The following physician request were entered in Epic:   Additional Comments:  Enrica Corliss R. Kimball, MSW, [place] 11/29/2014 8:48 AM

## 2014-11-29 NOTE — Clinical Social Work Psychosocial (Signed)
Clinical Social Work Department BRIEF PSYCHOSOCIAL ASSESSMENT 11/25/2014  Patient:  [name],[name]     Account Number:  [id number]     Admit date:  11/23/2014  Clinical Social Worker:  Dian Queen  Date/Time:  11/25/2014 08:37 AM  Referred by:  Physician  Date Referred:  11/25/2014 Referred for  SNF Placement   Other Referral:   Interview type:  Patient Other interview type:    PSYCHOSOCIAL DATA Living Status:  FAMILY Admitted from facility:   Level of care:   Primary support name:  Nelma Rothman Primary support relationship to patient:  FAMILY Degree of support available:   Patient lives with his daughter but it sounds like she is around very much according to patient.    CURRENT CONCERNS Current Concerns  Post-Acute Placement   Other Concerns:    SOCIAL WORK ASSESSMENT / PLAN Paitent is a 81 year old male who lives with his daughter. Patient is alert and oriented x3.  CSW met with patient to discuss going to a SNF for short term rehab.  Patient was expressing that he lives at home with his daughter and he does not really want to go to a SNF.  Patient talked about how since he retired he has not been in as good of health, and he was upset that his health has gone downhill.  Patient expressed he has spent time in a nursing home and did not really like it.  Patient talked about how he took care of his wife for a few years at home until she passed away.  Patient talked about how he used to golf all the time, and how he is unable to do it anymore.  Patient expressed that he feels like his daughter wants him to move to a nursing home.  Patient was not sure if he wants to go to a SNF for rehab or just return home.  Patient said he will think about it and agreed to have CSW check back with him.  Patient did agree to start the process of looking for a SNF for short term rehab.   Assessment/plan status:  Psychosocial Support/Ongoing Assessment of Needs Other assessment/ plan:    Information/referral to community resources:    PATIENT'S/FAMILY'S RESPONSE TO PLAN OF CARE: Patient is hesitant about going to a SNF, but is willing to consider it.   Jones Broom. [place], MSW, [place] 11/29/2014 8:44 AM

## 2014-11-29 NOTE — Discharge Summary (Addendum)
Physician Discharge Summary  Marck Mcclenny IHK:[id number] DOB: [birth year] DOA: 11/23/2014  PCP: Philis Fendt, MD  Admit date: 11/23/2014 Discharge date: 11/29/2014  Time spent: 35 minutes  Recommendations for Outpatient Follow-up:  1. Discharged to skilled nursing facility. Monitor renal function in 3-4 days along with serum potassium. 2. Please monitor daily weight, intake and output. 3. Patient will need an MRI of his kidneys done as an outpatient given suspicious lesion in the left kidney seen on ultrasound of his kidneys. 4. If renal function on not improved or worsening he will need outpatient referral to nephrology. 5. Please have patient evaluated by dietitian as outpatient .  Discharge Diagnoses:  Principal Problem:   Acute diastolic heart failure, NYHA class 2  Active Problems:   Hyperlipemia   Obesity   Hyperlipidemia   Microcytic anemia   Pedal edema   Acute renal failure superimposed on stage 3 chronic kidney disease   Uncontrolled type 2 diabetes mellitus with nephropathy   Discharge Condition: Fair  Diet recommendation: Art healthy/diabetic  Filed Weights   11/27/14 0441 11/28/14 0631 11/29/14 0627  Weight: 108.818 kg (239 lb 14.4 oz) 108.092 kg (238 lb 4.8 oz) 107.684 kg (237 lb 6.4 oz)    History of present illness:  Please refer to admission H&P for details, in brief, 81 year old male with past medical history of diabetes, hypertension, ischemic cardiomyopathy and morbid obesity presented to emergency room complaining of shortness of breath. Also reported progressive worsening of lower extremity swelling for the past 2 months. also had some orthopnea and abdominal discomfort with dyspnea on exertion. Patient admitted for acute diastolic CHF.   Hospital Course:  Acute diastolic CHF NYHA class II -Likely associated with medication noncompliance and dietary nonadherence. -started on IV diuretics but held as his renal function worsened. Has been net  negative of 17.4 L since admission admission. Given persistent bilateral leg edema I again placed him on 40 mg IV Lasix twice daily. Leg edema has  Improved. 2-D echo showing normal EF with diastolic dysfunction. Added Coreg and BiDil and baby aspirin. Lipid panel normal. Blood pressure well controlled. shortness of breath has markedly improved. -Patient counseled on dietary adherence and compliance with medications  Acute on chronic kidney disease Baseline creatinine of around 1.4 about 6 months back and seems to be progressively worsening. Ultrasound abdomen showing medical renal disease. Renal ultrasound showed decrease attenuation in the medial mid to lower pole region of the left kidney which appears more prominent with some concern for hypoechoic mass in the area. Recommends renal MR to further evaluate. Would recommend workup as outpatient. Avoiding nephrotoxins. Creatinine of 2.2 for this morning likely worsened with IV Lasix. Please follow labs in next 3-4 days  iron deficiency anemia Negative for fecal occult blood.. Added iron supplement.  Uncontrolled type 2 diabetes mellitus A1c of 8.4. Not taking any medications at home. Started on bedtime Lantus. Will add daily Amaryl. Continue with sliding scale insulin at the facility  Essential hypertension Blood pressure stable after medications adjusted.  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 Follow-up with oncology as outpatient  Protein calorie malnutrition Needs nutrition evaluation as outpatient  Diet: Heart healthy/diabetic    Code Status: FULL  Family Communication: none at bedside  Disposition:  Seen by physical therapy and recommends skilled nursing facility for ongoing PT needs . Patient agrees to go to skilled nursing facility.   Consultants: None  Procedures 2-D echo Renal ultrasound  Antibiotics:  none  Discharge Exam: Filed Vitals:   11/29/14 0935  BP: 137/52   Pulse: 69  Temp:   Resp:      General: Elderly male in no acute distress  HEENT: Pallor present, moist oral mucosa, no JVD  Chest: Clear to auscultation bilaterally, no added sounds  CVS: Normal S1 and S2, no murmurs rub or gallop  GI: soft, ND, NT, BS+  Musculoskeletal: Warm, 1+ pitting edema bilaterally (improved), tender to pressure  CNS: AAOX3  Discharge Instructions    Current Discharge Medication List    START taking these medications   Details  aspirin 81 MG tablet Take 1 tablet (81 mg total) by mouth daily. Qty: 30 tablet, Refills: 0    carvedilol (COREG) 3.125 MG tablet Take 1 tablet (3.125 mg total) by mouth 2 (two) times daily with a meal. Qty: 30 tablet, Refills: 0    ferrous sulfate 325 (65 FE) MG tablet Take 1 tablet (325 mg total) by mouth 2 (two) times daily with a meal. Qty: 60 tablet, Refills: 0    glimepiride (AMARYL) 2 MG tablet Take 1 tablet (2 mg total) by mouth daily with breakfast. Qty: 30 tablet, Refills: 0    insulin glargine (LANTUS) 100 UNIT/ML injection Inject 0.1 mLs (10 Units total) into the skin at bedtime. Qty: 10 mL, Refills: 11    isosorbide-hydrALAZINE (BIDIL) 20-37.5 MG per tablet Take 2 tablets by mouth 3 (three) times daily. Qty: 90 tablet, Refills: 0    levalbuterol (XOPENEX) 0.63 MG/3ML nebulizer solution Take 3 mLs (0.63 mg total) by nebulization every 6 (six) hours as needed for wheezing or shortness of breath. Qty: 3 mL, Refills: 12  Insulin aspart sliding scale                     fsg 0-120, no insulin                                                                 120-150, give 2 units ; 150-200, give 4 units; 200-250 , give 8 units,                                                                    250-300, give 10 units, 300-350 given 14 units, 350-400 give 16 Units                                                                  . If >400 call MD    CONTINUE these medications which have NOT CHANGED   Details   furosemide (LASIX) 40 MG tablet Take 1 tablet (40 mg total) by mouth daily. Qty: 30 tablet, Refills: 0      STOP taking these medications     amLODipine (NORVASC) 10 MG tablet        No  Known Allergies Follow-up Information    Please follow up.   Why:  MD at SNF       The results of significant diagnostics from this hospitalization (including imaging, microbiology, ancillary and laboratory) are listed below for reference.    Significant Diagnostic Studies: Dg Chest 2 View  11/23/2014   CLINICAL DATA:  Leg swelling.  Cellulitis  EXAM: CHEST  2 VIEW  COMPARISON:  05/21/2014  FINDINGS: Cardiac enlargement. Vascular congestion with mild interstitial edema. Small bilateral pleural effusions have developed since the prior study. Negative for pneumonia.  Lungs are hyperinflated  IMPRESSION: Findings consistent with fluid overload with vascular congestion and small pleural effusions.   Electronically Signed   By: Franchot Gallo M.D.   On: 11/23/2014 16:17   US Abdomen Complete  11/24/2014   CLINICAL DATA:  Abdominal discomfort.  Renal failure.  EXAM: ULTRASOUND ABDOMEN COMPLETE  COMPARISON:  None.  FINDINGS: Gallbladder: Wall Echo Sign is present indicating one large gallstone or multiple small gallstones packing the lumen. Gallbladder wall measurements are inaccurate in this setting but there is no pericholecystic fluid. Sonographic Percell Miller sign is negative.  Common bile duct: Diameter: 1.6 mm diameter.  Liver: No focal lesion identified. Within normal limits in parenchymal echogenicity.  IVC: No abnormality visualized.  Pancreas: Visualized portion unremarkable.  Spleen: Size and appearance within normal limits.  Right Kidney: Length: 10.9 cm. Echogenicity within normal limits. No mass or hydronephrosis visualized.  Left Kidney: Length: 10.5 cm. Echogenicity within normal limits. No mass or hydronephrosis visualized.  Abdominal aorta: No aneurysm visualized.  Other findings: None.  IMPRESSION:  Cholelithiasis without signs of acute cholecystitis. The gallbladder lumen is filled with calculi.   Electronically Signed   By: Rolla Flatten M.D.   On: 11/24/2014 11:57   US Renal  11/28/2014   CLINICAL DATA:  Acute renal insufficiency  EXAM: RENAL/URINARY TRACT ULTRASOUND COMPLETE  COMPARISON:  Abdominal ultrasound November 24, 2014  FINDINGS: Right Kidney:  Length: 11.3 cm. Echogenicity within normal limits. There is renal cortical thinning, however. No mass, perinephric fluid, or hydronephrosis visualized. No sonographically demonstrable calculus or ureterectasis.  Left Kidney:  Length: 11.7 cm. Echogenicity within normal limits. Renal cortical thickness is low normal. No perinephric fluid or hydronephrosis visualized. There is an area of decreased attenuation in the medial mid left kidney which appears hypoechoic currently, a change from study 5 days prior. This area measures 3.1 x 2.1 cm. No other potential masses identified on the left. No sonographically demonstrable calculus or ureterectasis.  Bladder:  Appears normal for degree of bladder distention.  IMPRESSION: There is renal cortical thinning on the right and borderline thinning on the left, findings that may be indicative of medical renal disease. Renal echogenicity is normal bilaterally. No obstructing foci identified on either side.  There is an area of decreased attenuation in the medial mid to lower pole region on the left kidney which appears more prominent than on study 5 days prior. The significance of this difference is frankly uncertain. The appearance on the current examination does raise concern for a hypoechoic mass in this area. Given this concern, correlation with renal MR is felt to be advisable for further assessment.  Study otherwise unremarkable.   Electronically Signed   By: Lowella Grip III M.D.   On: 11/28/2014 10:44    Microbiology: Recent Results (from the past 240 hour(s))  MRSA PCR Screening     Status: None    Collection Time: 11/25/14  4:34 AM  Result  Value Ref Range Status   MRSA by PCR NEGATIVE NEGATIVE Final    Comment:        The GeneXpert MRSA Assay (FDA approved for NASAL specimens only), is one component of a comprehensive MRSA colonization surveillance program. It is not intended to diagnose MRSA infection nor to guide or monitor treatment for MRSA infections.      Labs: Basic Metabolic Panel:  Recent Labs Lab 11/23/14 1551 11/23/14 1608 11/23/14 1955 11/24/14 0500 11/26/14 0345 11/28/14 0520 11/29/14 0506  NA 137 140  --  138 135 137 132*  K 4.3 4.4  --  4.4 3.9 3.7 3.8  CL 103 101  --  102 92* 94* 94*  CO2 24  --   --  28 38* 33* 31  GLUCOSE 197* 200*  --  167* 149* 136* 141*  BUN 27* 28*  --  27* 31* 30* 33*  CREATININE 1.81* 1.90* 1.86* 1.84* 2.23* 2.20* 2.24*  CALCIUM 7.9*  --   --  8.2* 8.9 8.0* 7.9*  MG 1.7  --  1.4*  --   --   --   --    Liver Function Tests:  Recent Labs Lab 11/23/14 1955 11/24/14 0500  AST 9 8  ALT 10 7  ALKPHOS 67 57  BILITOT 0.6 0.5  PROT 7.0 5.9*  ALBUMIN 3.2* 2.8*   No results for input(s): LIPASE, AMYLASE in the last 168 hours. No results for input(s): AMMONIA in the last 168 hours. CBC:  Recent Labs Lab 11/23/14 1551 11/23/14 1608 11/23/14 1955 11/24/14 0500 11/26/14 0345  WBC 4.8  --  5.5 5.0 5.1  NEUTROABS 2.4  --   --   --   --   HGB 7.6* 9.5* 8.2* 7.2* 7.8*  HCT 25.4* 28.0* 27.8* 24.4* 25.7*  MCV 76.3*  --  77.0* 76.0* 73.6*  PLT 233  --  278 248 251   Cardiac Enzymes:  Recent Labs Lab 11/23/14 1955 11/23/14 2345 11/24/14 0635  TROPONINI 0.04* 0.03 0.04*   BNP: BNP (last 3 results)  Recent Labs  10/20/14 1503 11/23/14 1551  BNP 599.1* 978.4*    ProBNP (last 3 results)  Recent Labs  12/08/13 1502 02/11/14 2256  PROBNP 1417.0* 2409.0*    CBG:  Recent Labs Lab 11/28/14 1129 11/28/14 1650 11/28/14 2122 11/28/14 2157 11/29/14 0522  GLUCAP 184* 196* 140* 149* 135*        Signed:  Suzy Kugel  [facility] 11/29/2014, 10:45 AM

## 2014-12-01 ENCOUNTER — Non-Acute Institutional Stay (SKILLED_NURSING_FACILITY): Payer: Medicare Other | Admitting: Internal Medicine

## 2014-12-01 DIAGNOSIS — C189 Malignant neoplasm of colon, unspecified: Secondary | ICD-10-CM

## 2014-12-01 DIAGNOSIS — N179 Acute kidney failure, unspecified: Secondary | ICD-10-CM

## 2014-12-01 DIAGNOSIS — N183 Chronic kidney disease, stage 3 unspecified: Secondary | ICD-10-CM

## 2014-12-01 DIAGNOSIS — IMO0002 Reserved for concepts with insufficient information to code with codable children: Secondary | ICD-10-CM

## 2014-12-01 DIAGNOSIS — E1322 Other specified diabetes mellitus with diabetic chronic kidney disease: Secondary | ICD-10-CM

## 2014-12-01 DIAGNOSIS — D509 Iron deficiency anemia, unspecified: Secondary | ICD-10-CM

## 2014-12-01 DIAGNOSIS — E1365 Other specified diabetes mellitus with hyperglycemia: Secondary | ICD-10-CM | POA: Diagnosis not present

## 2014-12-01 DIAGNOSIS — I5031 Acute diastolic (congestive) heart failure: Secondary | ICD-10-CM

## 2014-12-01 DIAGNOSIS — E1122 Type 2 diabetes mellitus with diabetic chronic kidney disease: Secondary | ICD-10-CM

## 2014-12-01 DIAGNOSIS — E1165 Type 2 diabetes mellitus with hyperglycemia: Secondary | ICD-10-CM

## 2014-12-01 NOTE — Assessment & Plan Note (Signed)
Controlled after med rearrangment

## 2014-12-01 NOTE — Assessment & Plan Note (Signed)
A1c of 8.4. Not taking any medications at home. Started on bedtime Lantus. Will add daily Amaryl. Continue with sliding scale insulin at the facility; avoiding ACE as nephrotoxin

## 2014-12-01 NOTE — Assessment & Plan Note (Signed)
Baseline creatinine of around 1.4 about 6 months back and seems to be progressively worsening. Ultrasound abdomen showing medical renal disease. Renal ultrasound showed decrease attenuation in the medial mid to lower pole region of the left kidney which appears more prominent with some concern for hypoechoic mass in the area. Recommends renal MR to further evaluate. Would recommend workup as outpatient. Avoiding nephrotoxins. Creatinine of 2.2 for this morning likely worsened with IV Lasix. Please follow labs in next 3-4 days

## 2014-12-01 NOTE — Assessment & Plan Note (Signed)
Status post partial colectomy in 2015. Follow-up with oncology as outpatient

## 2014-12-01 NOTE — Assessment & Plan Note (Signed)
-  Likely associated with medication noncompliance and dietary nonadherence. -started on IV diuretics but held as his renal function worsened. Has been net negative of 17.4 L since admission admission. Given persistent bilateral leg edema I again placed him on 40 mg IV Lasix twice daily. Leg edema has Improved. 2-D echo showing normal EF with diastolic dysfunction. Added Coreg and BiDil and baby aspirin. Lipid panel normal. Blood pressure well controlled. shortness of breath has markedly improved. -Patient counseled on dietary adherence and compliance with medications

## 2014-12-01 NOTE — Progress Notes (Signed)
MRN: [id number] Name: [name]  Sex: male Age: 81 y.o. DOB: [birth year]  [place] #: [facility] Facility/Room:220 Level Of Care: SNF Provider: Inocencio Homes D Emergency Contacts: Extended Emergency Contact Information Primary Emergency Contact: Cumby,Tami Address: [address]          [place],  [address] Johnnette Litter of [place] Phone: [phone] Mobile Phone: [phone] Relation: Daughter  Code Status:   Allergies: Review of patient's allergies indicates no known allergies.  Chief Complaint  Patient presents with  . New Admit To SNF    HPI: Patient is 81 y.o. male who is admitted to SNF for OT/PT after being hospitalized for exacerbation of CHF.  Past Medical History  Diagnosis Date  . Smoker   . Narcolepsy   . Hypertension   . BPH (benign prostatic hyperplasia)   . Arthritis   . Diabetes mellitus   . Neuropathy in diabetes   . Herpes   . Dyslipidemia   . Obesity   . Lymphedema     LE lymphedemia with hx of cellulitis.  . CKD (chronic kidney disease)   . Anemia   . History of GI bleed 06/2012    EGD unremarkable;  Colo with large cecal polyp (bx benign)  . Ischemic cardiomyopathy     Echo 06/19/12: EF 40-45%, mid to distal anteroseptal HK, moderate LAE, PASP 37  . Chronic systolic heart failure   . CAD (coronary artery disease)     a. probable underlying CAD;  b. Type 2 NSTEMi in setting of profound anemia (Hgb 3.6) from GI bleed in 06/2012 and WMA on echo;  c. med Rx due to CKD and GI bleed  . CHF (congestive heart failure)   . Atrial fibrillation     Past Surgical History  Procedure Laterality Date  . Leg surgery      Left leg surgery. broken leg  . Esophagogastroduodenoscopy  06/19/2012    Procedure: ESOPHAGOGASTRODUODENOSCOPY (EGD);  Surgeon: Beryle Beams, MD;  Location: [facility] ENDOSCOPY;  Service: Endoscopy;  Laterality: N/A;  . Colonoscopy  06/21/2012    Procedure: COLONOSCOPY;  Surgeon: Juanita Craver, MD;  Location: [facility]  ENDOSCOPY;  Service: Endoscopy;  Laterality: N/A;  . Colonoscopy Left 02/13/2014    Procedure: COLONOSCOPY;  Surgeon: Juanita Craver, MD;  Location: [place];  Service: Endoscopy;  Laterality: Left;  . Laparoscopic right colectomy Right 02/15/2014    Procedure: LAPAROSCOPIC ASISSTED RIGHT  COLECTOMY, POSSIBLE OPEN;  Surgeon: Rolm Bookbinder, MD;  Location: [place];  Service: General;  Laterality: Right;      Medication List       This list is accurate as of: 12/01/14 11:59 PM.  Always use your most recent med list.               aspirin 81 MG tablet  Take 1 tablet (81 mg total) by mouth daily.     carvedilol 3.125 MG tablet  Commonly known as:  COREG  Take 1 tablet (3.125 mg total) by mouth 2 (two) times daily with a meal.     ferrous sulfate 325 (65 FE) MG tablet  Take 1 tablet (325 mg total) by mouth 2 (two) times daily with a meal.     furosemide 40 MG tablet  Commonly known as:  LASIX  Take 1 tablet (40 mg total) by mouth daily.     glimepiride 2 MG tablet  Commonly known as:  AMARYL  Take 1 tablet (2 mg total) by mouth daily with breakfast.  insulin aspart 100 UNIT/ML injection  Commonly known as:  novoLOG  Inject 0-15 Units into the skin 3 (three) times daily with meals.     insulin glargine 100 UNIT/ML injection  Commonly known as:  LANTUS  Inject 0.1 mLs (10 Units total) into the skin at bedtime.     isosorbide-hydrALAZINE 20-37.5 MG per tablet  Commonly known as:  BIDIL  Take 2 tablets by mouth 3 (three) times daily.     levalbuterol 0.63 MG/3ML nebulizer solution  Commonly known as:  XOPENEX  Take 3 mLs (0.63 mg total) by nebulization every 6 (six) hours as needed for wheezing or shortness of breath.        No orders of the defined types were placed in this encounter.    Immunization History  Administered Date(s) Administered  . Influenza Split 06/25/2011  . Influenza, High Dose Seasonal PF 06/07/2013  . Pneumococcal Polysaccharide-23 02/25/2012,  06/20/2012  . Td 01/28/2005  . Tdap 10/20/2014    History  Substance Use Topics  . Smoking status: Former Smoker    Quit date: 08/26/1998  . Smokeless tobacco: Never Used  . Alcohol Use: No     Comment: in the past    Family history is noncontributory    Review of Systems  DATA OBTAINED: from patient, nurse GENERAL:  no fevers, fatigue, appetite changes SKIN: No itching, rash or wounds EYES: No eye pain, redness, discharge EARS: No earache, tinnitus, change in hearing NOSE: No congestion, drainage or bleeding  MOUTH/THROAT: No mouth or tooth pain, No sore throat RESPIRATORY: No cough, wheezing, SOB CARDIAC: No chest pain, palpitations, lower extremity edema  GI: No abdominal pain, No N/V/D or constipation, No heartburn or reflux  GU: No dysuria, frequency or urgency, or incontinence  MUSCULOSKELETAL: No unrelieved bone/joint pain NEUROLOGIC: No headache, dizziness or focal weakness PSYCHIATRIC: No overt anxiety or sadness, No behavior issue.   Filed Vitals:   12/03/14 1846  BP: 161/61  Pulse: 76  Temp: 97.2 F (36.2 C)  Resp: 21    Physical Exam  GENERAL APPEARANCE: Alert, conversant,BM  No acute distress.  SKIN: No diaphoresis rash HEAD: Normocephalic, atraumatic  EYES: Conjunctiva/lids clear. Pupils round, reactive. EOMs intact.  EARS: External exam WNL, canals clear. Hearing grossly normal.  NOSE: No deformity or discharge.  MOUTH/THROAT: Lips w/o lesions  RESPIRATORY: Breathing is even, unlabored. Lung sounds are clear   CARDIOVASCULAR: Heart RRR no murmurs, rubs or gallops. 3+ peripheral edema.   GASTROINTESTINAL: Abdomen is soft, non-tender, not distended w/ normal bowel sounds. GENITOURINARY: Bladder non tender, not distended  MUSCULOSKELETAL: No abnormal joints or musculature NEUROLOGIC:  Cranial nerves 2-12 grossly intact. Moves all extremities  PSYCHIATRIC: Mood and affect appropriate to situation, no behavioral issues  Patient Active Problem List    Diagnosis Date Noted  . Acute renal failure superimposed on stage 3 chronic kidney disease 11/29/2014  . Uncontrolled diabetes mellitus with stage 3 chronic kidney disease 11/29/2014  . CHF (congestive heart failure) 11/23/2014  . Acute diastolic heart failure, NYHA class 2 11/23/2014  . Hypertensive heart disease with congestive heart failure, NYHA class 2 02/28/2014  . Acute on chronic renal failure 02/19/2014  . Acute blood loss anemia 02/19/2014  . Colon cancer- adenocarcinoma 02/16/2014  . Colon tumor- not yet determined if cancerous  02/13/2014  . Hypertensive urgency 02/12/2014  . Acute GI bleeding 02/12/2014  . Pedal edema 02/12/2014  . Microcytic anemia 11/13/2013  . Noncompliance with medications 11/13/2013  . CKD (chronic kidney disease),  stage III 07/21/2013  . Back pain 02/26/2013  . Leukopenia 02/26/2013  . Polyp of colon 06/23/2012  . Chronic acquired lymphedema 06/25/2011  . Type II or unspecified type diabetes mellitus without mention of complication, not stated as uncontrolled 01/01/2011  . Hyperlipemia 01/01/2011  . Obesity 01/01/2011  . Personal history of noncompliance with medical treatment, presenting hazards to health 01/01/2011  . BPH (benign prostatic hyperplasia) 01/01/2011  . Hyperlipidemia 01/01/2011    CBC    Component Value Date/Time   WBC 5.1 11/26/2014 0345   WBC 3.5* 03/10/2014 1550   RBC 3.49* 11/26/2014 0345   RBC 3.66* 11/24/2014 1245   RBC 3.33* 03/10/2014 1550   HGB 7.8* 11/26/2014 0345   HGB 8.7* 03/10/2014 1550   HCT 25.7* 11/26/2014 0345   HCT 27.6* 03/10/2014 1550   PLT 251 11/26/2014 0345   PLT 103 Large platelets present* 03/10/2014 1550   MCV 73.6* 11/26/2014 0345   MCV 82.9 03/10/2014 1550   LYMPHSABS 1.0 11/23/2014 1551   LYMPHSABS 0.7* 03/10/2014 1550   MONOABS 1.3* 11/23/2014 1551   MONOABS 1.0* 03/10/2014 1550   EOSABS 0.1 11/23/2014 1551   EOSABS 0.1 03/10/2014 1550   BASOSABS 0.0 11/23/2014 1551   BASOSABS 0.0  03/10/2014 1550    CMP     Component Value Date/Time   NA 132* 11/29/2014 0506   K 3.8 11/29/2014 0506   CL 94* 11/29/2014 0506   CO2 31 11/29/2014 0506   GLUCOSE 141* 11/29/2014 0506   BUN 33* 11/29/2014 0506   CREATININE 2.24* 11/29/2014 0506   CREATININE 1.56* 10/07/2013 1141   CALCIUM 7.9* 11/29/2014 0506   PROT 5.9* 11/24/2014 0500   ALBUMIN 2.8* 11/24/2014 0500   AST 8 11/24/2014 0500   ALT 7 11/24/2014 0500   ALKPHOS 57 11/24/2014 0500   BILITOT 0.5 11/24/2014 0500   GFRNONAA 26* 11/29/2014 0506   GFRAA 31* 11/29/2014 0506    Assessment and Plan  Acute diastolic heart failure, NYHA class 2 -Likely associated with medication noncompliance and dietary nonadherence. -started on IV diuretics but held as his renal function worsened. Has been net negative of 17.4 L since admission admission. Given persistent bilateral leg edema I again placed him on 40 mg IV Lasix twice daily. Leg edema has Improved. 2-D echo showing normal EF with diastolic dysfunction. Added Coreg and BiDil and baby aspirin. Lipid panel normal. Blood pressure well controlled. shortness of breath has markedly improved. -Patient counseled on dietary adherence and compliance with medications   Acute renal failure superimposed on stage 3 chronic kidney disease Baseline creatinine of around 1.4 about 6 months back and seems to be progressively worsening. Ultrasound abdomen showing medical renal disease. Renal ultrasound showed decrease attenuation in the medial mid to lower pole region of the left kidney which appears more prominent with some concern for hypoechoic mass in the area. Recommends renal MR to further evaluate. Would recommend workup as outpatient. Avoiding nephrotoxins. Creatinine of 2.2 for this morning likely worsened with IV Lasix. Please follow labs in next 3-4 days    Microcytic anemia hemmocult neg;iron supplement started   Uncontrolled diabetes mellitus with stage 3 chronic kidney  disease A1c of 8.4. Not taking any medications at home. Started on bedtime Lantus. Will add daily Amaryl. Continue with sliding scale insulin at the facility; avoiding ACE as nephrotoxin   Colon cancer- adenocarcinoma Status post partial colectomy in 2015. Follow-up with oncology as outpatient   Hypertensive heart disease with congestive heart failure, NYHA class 2  Controlled after med rearrangment     Hennie Duos, MD

## 2014-12-01 NOTE — Assessment & Plan Note (Signed)
hemmocult neg;iron supplement started

## 2014-12-03 ENCOUNTER — Encounter: Payer: Self-pay | Admitting: Internal Medicine

## 2014-12-05 ENCOUNTER — Non-Acute Institutional Stay (SKILLED_NURSING_FACILITY): Payer: Medicare Other | Admitting: Internal Medicine

## 2014-12-05 ENCOUNTER — Encounter: Payer: Self-pay | Admitting: Internal Medicine

## 2014-12-05 DIAGNOSIS — I5032 Chronic diastolic (congestive) heart failure: Secondary | ICD-10-CM

## 2014-12-05 DIAGNOSIS — E1122 Type 2 diabetes mellitus with diabetic chronic kidney disease: Secondary | ICD-10-CM

## 2014-12-05 DIAGNOSIS — N400 Enlarged prostate without lower urinary tract symptoms: Secondary | ICD-10-CM

## 2014-12-05 DIAGNOSIS — IMO0002 Reserved for concepts with insufficient information to code with codable children: Secondary | ICD-10-CM

## 2014-12-05 DIAGNOSIS — E1165 Type 2 diabetes mellitus with hyperglycemia: Secondary | ICD-10-CM | POA: Diagnosis not present

## 2014-12-05 DIAGNOSIS — I110 Hypertensive heart disease with heart failure: Secondary | ICD-10-CM

## 2014-12-05 DIAGNOSIS — E1322 Other specified diabetes mellitus with diabetic chronic kidney disease: Secondary | ICD-10-CM

## 2014-12-05 DIAGNOSIS — Z9119 Patient's noncompliance with other medical treatment and regimen: Secondary | ICD-10-CM

## 2014-12-05 DIAGNOSIS — E785 Hyperlipidemia, unspecified: Secondary | ICD-10-CM | POA: Diagnosis not present

## 2014-12-05 DIAGNOSIS — R600 Localized edema: Secondary | ICD-10-CM

## 2014-12-05 DIAGNOSIS — N183 Chronic kidney disease, stage 3 (moderate): Secondary | ICD-10-CM | POA: Diagnosis not present

## 2014-12-05 DIAGNOSIS — Z91199 Patient's noncompliance with other medical treatment and regimen due to unspecified reason: Secondary | ICD-10-CM

## 2014-12-05 DIAGNOSIS — E669 Obesity, unspecified: Secondary | ICD-10-CM | POA: Diagnosis not present

## 2014-12-05 DIAGNOSIS — I509 Heart failure, unspecified: Secondary | ICD-10-CM

## 2014-12-05 DIAGNOSIS — E1365 Other specified diabetes mellitus with hyperglycemia: Secondary | ICD-10-CM | POA: Diagnosis not present

## 2014-12-05 NOTE — Progress Notes (Signed)
MRN: [id number] Name: [name]  Sex: male Age: 81 y.o. DOB: 1933-11-10  [place] #: [facility] Facility/Room: 220 Level Of Care: SNF Provider: Inocencio Homes D Emergency Contacts: Extended Emergency Contact Information Primary Emergency Contact: Tipler,Tami Address: [address]          [place], [place] [address] Johnnette Litter of [place] Phone: [phone] Mobile Phone: [phone] Relation: Daughter  Code Status: FULL  Allergies: Review of patient's allergies indicates no known allergies.  Chief Complaint  Patient presents with  . Discharge Note    HPI: Patient is 81 y.o. male who was admitted to SNF for ot/PT after acute exacerbation of CHF who wants to go home.  Past Medical History  Diagnosis Date  . Smoker   . Narcolepsy   . Hypertension   . BPH (benign prostatic hyperplasia)   . Arthritis   . Diabetes mellitus   . Neuropathy in diabetes   . Herpes   . Dyslipidemia   . Obesity   . Lymphedema     LE lymphedemia with hx of cellulitis.  . CKD (chronic kidney disease)   . Anemia   . History of GI bleed 06/2012    EGD unremarkable;  Colo with large cecal polyp (bx benign)  . Ischemic cardiomyopathy     Echo 06/19/12: EF 40-45%, mid to distal anteroseptal HK, moderate LAE, PASP 37  . Chronic systolic heart failure   . CAD (coronary artery disease)     a. probable underlying CAD;  b. Type 2 NSTEMi in setting of profound anemia (Hgb 3.6) from GI bleed in 06/2012 and WMA on echo;  c. med Rx due to CKD and GI bleed  . CHF (congestive heart failure)   . Atrial fibrillation     Past Surgical History  Procedure Laterality Date  . Leg surgery      Left leg surgery. broken leg  . Esophagogastroduodenoscopy  06/19/2012    Procedure: ESOPHAGOGASTRODUODENOSCOPY (EGD);  Surgeon: Beryle Beams, MD;  Location: [facility] ENDOSCOPY;  Service: Endoscopy;  Laterality: N/A;  . Colonoscopy  06/21/2012    Procedure: COLONOSCOPY;  Surgeon: Juanita Craver, MD;  Location: [facility]  ENDOSCOPY;  Service: Endoscopy;  Laterality: N/A;  . Colonoscopy Left 02/13/2014    Procedure: COLONOSCOPY;  Surgeon: Juanita Craver, MD;  Location: [place];  Service: Endoscopy;  Laterality: Left;  . Laparoscopic right colectomy Right 02/15/2014    Procedure: LAPAROSCOPIC ASISSTED RIGHT  COLECTOMY, POSSIBLE OPEN;  Surgeon: Rolm Bookbinder, MD;  Location: [place];  Service: General;  Laterality: Right;      Medication List       This list is accurate as of: 12/05/14  4:28 PM.  Always use your most recent med list.               aspirin 81 MG tablet  Take 1 tablet (81 mg total) by mouth daily.     carvedilol 3.125 MG tablet  Commonly known as:  COREG  Take 1 tablet (3.125 mg total) by mouth 2 (two) times daily with a meal.     ferrous sulfate 325 (65 FE) MG tablet  Take 1 tablet (325 mg total) by mouth 2 (two) times daily with a meal.     furosemide 40 MG tablet  Commonly known as:  LASIX  Take 1 tablet (40 mg total) by mouth daily.     glimepiride 2 MG tablet  Commonly known as:  AMARYL  Take 1 tablet (2 mg total) by mouth daily with breakfast.  insulin aspart 100 UNIT/ML injection  Commonly known as:  novoLOG  Inject 0-15 Units into the skin 3 (three) times daily with meals.     insulin glargine 100 UNIT/ML injection  Commonly known as:  LANTUS  Inject 0.1 mLs (10 Units total) into the skin at bedtime.     isosorbide-hydrALAZINE 20-37.5 MG per tablet  Commonly known as:  BIDIL  Take 2 tablets by mouth 3 (three) times daily.     levalbuterol 0.63 MG/3ML nebulizer solution  Commonly known as:  XOPENEX  Take 3 mLs (0.63 mg total) by nebulization every 6 (six) hours as needed for wheezing or shortness of breath.        No orders of the defined types were placed in this encounter.    Immunization History  Administered Date(s) Administered  . Influenza Split 06/25/2011  . Influenza, High Dose Seasonal PF 06/07/2013  . Pneumococcal Polysaccharide-23  02/25/2012, 06/20/2012  . Td 01/28/2005  . Tdap 10/20/2014    History  Substance Use Topics  . Smoking status: Former Smoker    Quit date: 08/26/1998  . Smokeless tobacco: Never Used  . Alcohol Use: No     Comment: in the past    Filed Vitals:   12/05/14 1619  BP: 179/73  Pulse: 65  Temp: 97.5 F (36.4 C)  Resp: 21    Physical Exam  GENERAL APPEARANCE: Alert, conversant. No acute distress.  HEENT: Unremarkable. RESPIRATORY: Breathing is even, unlabored. Lung sounds are clear   CARDIOVASCULAR: Heart RRR no murmurs, rubs or gallops. 3+ peripheral edema.  GASTROINTESTINAL: Abdomen is soft, non-tender, not distended w/ normal bowel sounds.  NEUROLOGIC: Cranial nerves 2-12 grossly intact. Moves all extremities  Patient Active Problem List   Diagnosis Date Noted  . Acute renal failure superimposed on stage 3 chronic kidney disease 11/29/2014  . Uncontrolled diabetes mellitus with stage 3 chronic kidney disease 11/29/2014  . CHF (congestive heart failure) 11/23/2014  . Acute diastolic heart failure, NYHA class 2 11/23/2014  . Hypertensive heart disease with congestive heart failure, NYHA class 2 02/28/2014  . Acute on chronic renal failure 02/19/2014  . Acute blood loss anemia 02/19/2014  . Colon cancer- adenocarcinoma 02/16/2014  . Colon tumor- not yet determined if cancerous  02/13/2014  . Hypertensive urgency 02/12/2014  . Acute GI bleeding 02/12/2014  . Pedal edema 02/12/2014  . Microcytic anemia 11/13/2013  . Noncompliance with medications 11/13/2013  . CKD (chronic kidney disease), stage III 07/21/2013  . Back pain 02/26/2013  . Leukopenia 02/26/2013  . Polyp of colon 06/23/2012  . Chronic acquired lymphedema 06/25/2011  . Type 2 diabetes mellitus, uncontrolled 01/01/2011  . Hyperlipemia 01/01/2011  . Obesity 01/01/2011  . Personal history of noncompliance with medical treatment, presenting hazards to health 01/01/2011  . BPH (benign prostatic hyperplasia)  01/01/2011  . Hyperlipidemia 01/01/2011    CBC    Component Value Date/Time   WBC 5.1 11/26/2014 0345   WBC 3.5* 03/10/2014 1550   RBC 3.49* 11/26/2014 0345   RBC 3.66* 11/24/2014 1245   RBC 3.33* 03/10/2014 1550   HGB 7.8* 11/26/2014 0345   HGB 8.7* 03/10/2014 1550   HCT 25.7* 11/26/2014 0345   HCT 27.6* 03/10/2014 1550   PLT 251 11/26/2014 0345   PLT 103 Large platelets present* 03/10/2014 1550   MCV 73.6* 11/26/2014 0345   MCV 82.9 03/10/2014 1550   LYMPHSABS 1.0 11/23/2014 1551   LYMPHSABS 0.7* 03/10/2014 1550   MONOABS 1.3* 11/23/2014 1551   MONOABS 1.0* 03/10/2014  1550   EOSABS 0.1 11/23/2014 1551   EOSABS 0.1 03/10/2014 1550   BASOSABS 0.0 11/23/2014 1551   BASOSABS 0.0 03/10/2014 1550    CMP     Component Value Date/Time   NA 132* 11/29/2014 0506   K 3.8 11/29/2014 0506   CL 94* 11/29/2014 0506   CO2 31 11/29/2014 0506   GLUCOSE 141* 11/29/2014 0506   BUN 33* 11/29/2014 0506   CREATININE 2.24* 11/29/2014 0506   CREATININE 1.56* 10/07/2013 1141   CALCIUM 7.9* 11/29/2014 0506   PROT 5.9* 11/24/2014 0500   ALBUMIN 2.8* 11/24/2014 0500   AST 8 11/24/2014 0500   ALT 7 11/24/2014 0500   ALKPHOS 57 11/24/2014 0500   BILITOT 0.5 11/24/2014 0500   GFRNONAA 26* 11/29/2014 0506   GFRAA 31* 11/29/2014 0506    Assessment and Plan  Pt is d/c to home in stable condition with HH/OT/PT/nursing. On imaging in hospital a mass was seen on L kidney and this will need f/u as an outpt.  Hennie Duos, MD

## 2015-01-16 ENCOUNTER — Ambulatory Visit: Payer: Medicare Other | Admitting: Podiatry

## 2015-01-21 ENCOUNTER — Encounter (HOSPITAL_COMMUNITY): Payer: Self-pay | Admitting: Emergency Medicine

## 2015-01-21 ENCOUNTER — Emergency Department (HOSPITAL_COMMUNITY)
Admission: EM | Admit: 2015-01-21 | Discharge: 2015-01-21 | Disposition: A | Discharge status: Home / Self Care | Payer: Medicare Other | Attending: Emergency Medicine | Admitting: Emergency Medicine

## 2015-01-21 DIAGNOSIS — I4891 Unspecified atrial fibrillation: Secondary | ICD-10-CM | POA: Insufficient documentation

## 2015-01-21 DIAGNOSIS — I129 Hypertensive chronic kidney disease with stage 1 through stage 4 chronic kidney disease, or unspecified chronic kidney disease: Secondary | ICD-10-CM | POA: Insufficient documentation

## 2015-01-21 DIAGNOSIS — Z794 Long term (current) use of insulin: Secondary | ICD-10-CM | POA: Insufficient documentation

## 2015-01-21 DIAGNOSIS — I5022 Chronic systolic (congestive) heart failure: Secondary | ICD-10-CM | POA: Insufficient documentation

## 2015-01-21 DIAGNOSIS — R040 Epistaxis: Secondary | ICD-10-CM | POA: Insufficient documentation

## 2015-01-21 DIAGNOSIS — E669 Obesity, unspecified: Secondary | ICD-10-CM | POA: Insufficient documentation

## 2015-01-21 DIAGNOSIS — Z7982 Long term (current) use of aspirin: Secondary | ICD-10-CM | POA: Diagnosis not present

## 2015-01-21 DIAGNOSIS — Z87891 Personal history of nicotine dependence: Secondary | ICD-10-CM | POA: Diagnosis not present

## 2015-01-21 DIAGNOSIS — N189 Chronic kidney disease, unspecified: Secondary | ICD-10-CM | POA: Insufficient documentation

## 2015-01-21 DIAGNOSIS — D649 Anemia, unspecified: Secondary | ICD-10-CM | POA: Insufficient documentation

## 2015-01-21 DIAGNOSIS — M1990 Unspecified osteoarthritis, unspecified site: Secondary | ICD-10-CM | POA: Insufficient documentation

## 2015-01-21 DIAGNOSIS — Z79899 Other long term (current) drug therapy: Secondary | ICD-10-CM | POA: Insufficient documentation

## 2015-01-21 DIAGNOSIS — Z87438 Personal history of other diseases of male genital organs: Secondary | ICD-10-CM | POA: Insufficient documentation

## 2015-01-21 DIAGNOSIS — I2510 Atherosclerotic heart disease of native coronary artery without angina pectoris: Secondary | ICD-10-CM | POA: Diagnosis not present

## 2015-01-21 DIAGNOSIS — Z8669 Personal history of other diseases of the nervous system and sense organs: Secondary | ICD-10-CM | POA: Diagnosis not present

## 2015-01-21 DIAGNOSIS — E119 Type 2 diabetes mellitus without complications: Secondary | ICD-10-CM | POA: Insufficient documentation

## 2015-01-21 DIAGNOSIS — Z8619 Personal history of other infectious and parasitic diseases: Secondary | ICD-10-CM | POA: Insufficient documentation

## 2015-01-21 MED ORDER — CARVEDILOL 3.125 MG PO TABS
3.1250 mg | ORAL_TABLET | Freq: Once | ORAL | Status: AC
  Administered 2015-01-21: 3.125 mg via ORAL
  Filled 2015-01-21: qty 1

## 2015-01-21 MED ORDER — HYDRALAZINE HCL 20 MG/ML IJ SOLN
2.0000 mg | Freq: Once | INTRAMUSCULAR | Status: AC
  Administered 2015-01-21: 2 mg via INTRAMUSCULAR
  Filled 2015-01-21: qty 1

## 2015-01-21 NOTE — ED Notes (Signed)
Cleaned up patient of the blood on his face and chest.

## 2015-01-21 NOTE — ED Notes (Signed)
Fluid Challenge complete.

## 2015-01-21 NOTE — ED Notes (Signed)
Pt. arrived with EMS from home reports epistaxis on bilateral nares onset this evening , mild bleeding at arrival , denies nasal injury / respirations unlabored .

## 2015-01-21 NOTE — ED Provider Notes (Signed)
CSN: [id number]     Arrival date & time 01/21/15  0355 History   First MD Initiated Contact with Patient 01/21/15 [phone]     Chief Complaint  Patient presents with  . Epistaxis     (Consider location/radiation/quality/duration/timing/severity/associated sxs/prior Treatment) Patient is a 81 y.o. male presenting with nosebleeds. The history is provided by the patient.  Epistaxis Location:  Bilateral Severity:  Mild Duration:  30 minutes Timing:  Constant Progression:  Partially resolved Chronicity:  Recurrent Context: not nose picking, not recent infection and not thrombocytopenia   Relieved by:  Nothing Worsened by:  Nothing tried Ineffective treatments:  None tried Associated symptoms: no blood in oropharynx, no fever, no sneezing and no syncope   Risk factors: no head and neck surgery     Past Medical History  Diagnosis Date  . Smoker   . Narcolepsy   . Hypertension   . BPH (benign prostatic hyperplasia)   . Arthritis   . Diabetes mellitus   . Neuropathy in diabetes   . Herpes   . Dyslipidemia   . Obesity   . Lymphedema     LE lymphedemia with hx of cellulitis.  . CKD (chronic kidney disease)   . Anemia   . History of GI bleed 06/2012    EGD unremarkable;  Colo with large cecal polyp (bx benign)  . Ischemic cardiomyopathy     Echo 06/19/12: EF 40-45%, mid to distal anteroseptal HK, moderate LAE, PASP 37  . Chronic systolic heart failure   . CAD (coronary artery disease)     a. probable underlying CAD;  b. Type 2 NSTEMi in setting of profound anemia (Hgb 3.6) from GI bleed in 06/2012 and WMA on echo;  c. med Rx due to CKD and GI bleed  . CHF (congestive heart failure)   . Atrial fibrillation    Past Surgical History  Procedure Laterality Date  . Leg surgery      Left leg surgery. broken leg  . Esophagogastroduodenoscopy  06/19/2012    Procedure: ESOPHAGOGASTRODUODENOSCOPY (EGD);  Surgeon: Beryle Beams, MD;  Location: [facility] ENDOSCOPY;  Service: Endoscopy;   Laterality: N/A;  . Colonoscopy  06/21/2012    Procedure: COLONOSCOPY;  Surgeon: Juanita Craver, MD;  Location: [facility] ENDOSCOPY;  Service: Endoscopy;  Laterality: N/A;  . Colonoscopy Left 02/13/2014    Procedure: COLONOSCOPY;  Surgeon: Juanita Craver, MD;  Location: [place];  Service: Endoscopy;  Laterality: Left;  . Laparoscopic right colectomy Right 02/15/2014    Procedure: LAPAROSCOPIC ASISSTED RIGHT  COLECTOMY, POSSIBLE OPEN;  Surgeon: Rolm Bookbinder, MD;  Location: [place];  Service: General;  Laterality: Right;   Family History  Problem Relation Age of Onset  . Brain cancer Mother   . Diabetes type II Sister   . Narcolepsy Paternal Uncle    History  Substance Use Topics  . Smoking status: Former Smoker    Quit date: 08/26/1998  . Smokeless tobacco: Never Used  . Alcohol Use: No     Comment: in the past    Review of Systems  Constitutional: Negative for fever.  HENT: Positive for nosebleeds. Negative for sneezing.   Respiratory: Negative for shortness of breath.   Cardiovascular: Negative for chest pain and syncope.  All other systems reviewed and are negative.     Allergies  Review of patient's allergies indicates no known allergies.  Home Medications   Prior to Admission medications   Medication Sig Start Date End Date Taking? Authorizing Provider  aspirin 81 MG tablet  Take 1 tablet (81 mg total) by mouth daily. 11/29/14   Nishant Dhungel, MD  carvedilol (COREG) 3.125 MG tablet Take 1 tablet (3.125 mg total) by mouth 2 (two) times daily with a meal. 11/29/14   Nishant Dhungel, MD  ferrous sulfate 325 (65 FE) MG tablet Take 1 tablet (325 mg total) by mouth 2 (two) times daily with a meal. 11/29/14   Nishant Dhungel, MD  furosemide (LASIX) 40 MG tablet Take 1 tablet (40 mg total) by mouth daily. 01/10/14   Denita Lung, MD  glimepiride (AMARYL) 2 MG tablet Take 1 tablet (2 mg total) by mouth daily with breakfast. 11/29/14   Nishant Dhungel, MD  insulin aspart (NOVOLOG) 100  UNIT/ML injection Inject 0-15 Units into the skin 3 (three) times daily with meals. 11/29/14   Nishant Dhungel, MD  insulin glargine (LANTUS) 100 UNIT/ML injection Inject 0.1 mLs (10 Units total) into the skin at bedtime. 11/29/14   Nishant Dhungel, MD  isosorbide-hydrALAZINE (BIDIL) 20-37.5 MG per tablet Take 2 tablets by mouth 3 (three) times daily. 11/29/14   Nishant Dhungel, MD  levalbuterol (XOPENEX) 0.63 MG/3ML nebulizer solution Take 3 mLs (0.63 mg total) by nebulization every 6 (six) hours as needed for wheezing or shortness of breath. 11/29/14   Nishant Dhungel, MD   BP 195/77 mmHg  Pulse 84  Temp(Src) 98 F (36.7 C) (Oral)  Resp 17  SpO2 98% Physical Exam  Constitutional: He is oriented to person, place, and time. He appears well-developed and well-nourished. No distress.  HENT:  Head: Normocephalic and atraumatic.  Nose: No nasal septal hematoma. Epistaxis is observed. Right sinus exhibits no maxillary sinus tenderness and no frontal sinus tenderness. Left sinus exhibits no maxillary sinus tenderness and no frontal sinus tenderness.  Mouth/Throat: Oropharynx is clear and moist. No oropharyngeal exudate.  B kisselbach's plexus  Eyes: Conjunctivae and EOM are normal. Pupils are equal, round, and reactive to light.  Neck: Normal range of motion. Neck supple.  Cardiovascular: Normal rate, regular rhythm and intact distal pulses.   Pulmonary/Chest: Effort normal and breath sounds normal. No respiratory distress. He has no wheezes. He has no rales.  Abdominal: Soft. Bowel sounds are normal. There is no tenderness. There is no rebound and no guarding.  Musculoskeletal: Normal range of motion.  Neurological: He is alert and oriented to person, place, and time.  Skin: Skin is warm and dry.  Psychiatric: He has a normal mood and affect.    ED Course  EPISTAXIS MANAGEMENT Date/Time: 01/21/2015 5:27 AM Performed by: Veatrice Kells Authorized by: Veatrice Kells Consent: Verbal consent  obtained. Consent given by: patient Patient identity confirmed: arm band Patient sedated: no Treatment site: right Kiesselbach's area and left Kiesselbach's area Repair method: silver nitrate Post-procedure assessment: bleeding stopped Patient tolerance: Patient tolerated the procedure well with no immediate complications   (including critical care time) Labs Review Labs Reviewed - No data to display  Imaging Review No results found.   EKG Interpretation   Date/Time:  Saturday Jan 21 2015 04:18:16 EDT Ventricular Rate:  82 PR Interval:  258 QRS Duration: 92 QT Interval:  449 QTC Calculation: 524 R Axis:   -14 Text Interpretation:  Sinus rhythm Sinus pause Prolonged PR interval LVH  with secondary repolarization abnormality Prolonged QT interval Confirmed  by [facility]  MD, Hilman Kissling ([id number]) on 01/21/2015 4:50:10 AM      MDM   Final diagnoses:  None   Take your BP meds as directed and return for further bleeding  Veatrice Kells, MD 01/21/15 [phone]

## 2015-01-21 NOTE — Discharge Instructions (Signed)
Nosebleed °A nosebleed can be caused by many things, including: °· Getting hit hard in the nose. °· Infections. °· Dry nose. °· Colds. °· Medicines. °Your doctor may do lab testing if you get nosebleeds a lot and the cause is not known. °HOME CARE  °· If your nose was packed with material, keep it there until your doctor takes it out. Put the pack back in your nose if the pack falls out. °· Do not blow your nose for 12 hours after the nosebleed. °· Sit up and bend forward if your nose starts bleeding again. Pinch the front half of your nose nonstop for 20 minutes. °· Put petroleum jelly inside your nose every morning if you have a dry nose. °· Use a humidifier to make the air less dry. °· Do not take aspirin. °· Try not to strain, lift, or bend at the waist for many days after the nosebleed. °GET HELP RIGHT AWAY IF:  °· Nosebleeds keep happening and are hard to stop or control. °· You have bleeding or bruises that are not normal on other parts of the body. °· You have a fever. °· The nosebleeds get worse. °· You get lightheaded, feel faint, sweaty, or throw up (vomit) blood. °MAKE SURE YOU:  °· Understand these instructions. °· Will watch your condition. °· Will get help right away if you are not doing well or get worse. °Document Released: 05/21/2008 Document Revised: 11/04/2011 Document Reviewed: 05/21/2008 °ExitCare® Patient Information ©2015 [employer]. This information is not intended to replace advice given to you by your health care provider. Make sure you discuss any questions you have with your health care provider. ° °

## 2015-04-17 ENCOUNTER — Emergency Department (HOSPITAL_COMMUNITY)
Admission: EM | Admit: 2015-04-17 | Discharge: 2015-04-17 | Disposition: A | Discharge status: Home / Self Care | Payer: Medicare Other | Attending: Emergency Medicine | Admitting: Emergency Medicine

## 2015-04-17 ENCOUNTER — Encounter (HOSPITAL_COMMUNITY): Payer: Self-pay

## 2015-04-17 DIAGNOSIS — I5022 Chronic systolic (congestive) heart failure: Secondary | ICD-10-CM | POA: Diagnosis not present

## 2015-04-17 DIAGNOSIS — I4891 Unspecified atrial fibrillation: Secondary | ICD-10-CM | POA: Diagnosis not present

## 2015-04-17 DIAGNOSIS — Z794 Long term (current) use of insulin: Secondary | ICD-10-CM | POA: Diagnosis not present

## 2015-04-17 DIAGNOSIS — I2510 Atherosclerotic heart disease of native coronary artery without angina pectoris: Secondary | ICD-10-CM | POA: Diagnosis not present

## 2015-04-17 DIAGNOSIS — Z8619 Personal history of other infectious and parasitic diseases: Secondary | ICD-10-CM | POA: Diagnosis not present

## 2015-04-17 DIAGNOSIS — I129 Hypertensive chronic kidney disease with stage 1 through stage 4 chronic kidney disease, or unspecified chronic kidney disease: Secondary | ICD-10-CM | POA: Insufficient documentation

## 2015-04-17 DIAGNOSIS — N189 Chronic kidney disease, unspecified: Secondary | ICD-10-CM | POA: Diagnosis not present

## 2015-04-17 DIAGNOSIS — Z4801 Encounter for change or removal of surgical wound dressing: Secondary | ICD-10-CM | POA: Diagnosis not present

## 2015-04-17 DIAGNOSIS — Z87438 Personal history of other diseases of male genital organs: Secondary | ICD-10-CM | POA: Insufficient documentation

## 2015-04-17 DIAGNOSIS — Z8719 Personal history of other diseases of the digestive system: Secondary | ICD-10-CM | POA: Diagnosis not present

## 2015-04-17 DIAGNOSIS — Z7982 Long term (current) use of aspirin: Secondary | ICD-10-CM | POA: Diagnosis not present

## 2015-04-17 DIAGNOSIS — M1990 Unspecified osteoarthritis, unspecified site: Secondary | ICD-10-CM | POA: Insufficient documentation

## 2015-04-17 DIAGNOSIS — Z79899 Other long term (current) drug therapy: Secondary | ICD-10-CM | POA: Diagnosis not present

## 2015-04-17 DIAGNOSIS — Z87891 Personal history of nicotine dependence: Secondary | ICD-10-CM | POA: Diagnosis not present

## 2015-04-17 DIAGNOSIS — T148XXA Other injury of unspecified body region, initial encounter: Secondary | ICD-10-CM

## 2015-04-17 DIAGNOSIS — E669 Obesity, unspecified: Secondary | ICD-10-CM | POA: Insufficient documentation

## 2015-04-17 DIAGNOSIS — E1140 Type 2 diabetes mellitus with diabetic neuropathy, unspecified: Secondary | ICD-10-CM | POA: Insufficient documentation

## 2015-04-17 DIAGNOSIS — D649 Anemia, unspecified: Secondary | ICD-10-CM | POA: Diagnosis not present

## 2015-04-17 LAB — I-STAT CHEM 8, ED
BUN: 29 mg/dL — AB (ref 6–20)
CALCIUM ION: 1.14 mmol/L (ref 1.13–1.30)
Chloride: 105 mmol/L (ref 101–111)
Creatinine, Ser: 2 mg/dL — ABNORMAL HIGH (ref 0.61–1.24)
Glucose, Bld: 108 mg/dL — ABNORMAL HIGH (ref 65–99)
HEMATOCRIT: 34 % — AB (ref 39.0–52.0)
HEMOGLOBIN: 11.6 g/dL — AB (ref 13.0–17.0)
Potassium: 4.3 mmol/L (ref 3.5–5.1)
Sodium: 141 mmol/L (ref 135–145)
TCO2: 24 mmol/L (ref 0–100)

## 2015-04-17 LAB — CBC WITH DIFFERENTIAL/PLATELET
BASOS PCT: 0 % (ref 0–1)
Basophils Absolute: 0 10*3/uL (ref 0.0–0.1)
EOS ABS: 0.1 10*3/uL (ref 0.0–0.7)
Eosinophils Relative: 1 % (ref 0–5)
HCT: 35.1 % — ABNORMAL LOW (ref 39.0–52.0)
HEMOGLOBIN: 10.8 g/dL — AB (ref 13.0–17.0)
LYMPHS ABS: 1 10*3/uL (ref 0.7–4.0)
Lymphocytes Relative: 19 % (ref 12–46)
MCH: 25.4 pg — AB (ref 26.0–34.0)
MCHC: 30.8 g/dL (ref 30.0–36.0)
MCV: 82.6 fL (ref 78.0–100.0)
MONO ABS: 1.8 10*3/uL — AB (ref 0.1–1.0)
MONOS PCT: 36 % — AB (ref 3–12)
NEUTROS PCT: 44 % (ref 43–77)
Neutro Abs: 2.2 10*3/uL (ref 1.7–7.7)
Platelets: 113 10*3/uL — ABNORMAL LOW (ref 150–400)
RBC: 4.25 MIL/uL (ref 4.22–5.81)
RDW: 15 % (ref 11.5–15.5)
WBC: 5 10*3/uL (ref 4.0–10.5)

## 2015-04-17 MED ORDER — BACITRACIN ZINC 500 UNIT/GM EX OINT
TOPICAL_OINTMENT | CUTANEOUS | Status: DC | PRN
  Filled 2015-04-17: qty 0.9

## 2015-04-17 MED ORDER — TETANUS-DIPHTH-ACELL PERTUSSIS 5-2.5-18.5 LF-MCG/0.5 IM SUSP
0.5000 mL | Freq: Once | INTRAMUSCULAR | Status: DC
  Filled 2015-04-17: qty 0.5

## 2015-04-17 NOTE — ED Notes (Signed)
Social worker at bedside.

## 2015-04-17 NOTE — ED Provider Notes (Signed)
CSN: [id number]     Arrival date & time 04/17/15  1552 History   First MD Initiated Contact with Patient 04/17/15 1810     Chief Complaint  Patient presents with  . Wound Check  . Hypertension     (Consider location/radiation/quality/duration/timing/severity/associated sxs/prior Treatment) HPI Patient reports he's not had wound is checked on her bandages changed on his legs for the past 1 month. He was referred to a wound center by his primary care physician Dr.Avbuere, but reports that they would not change his dressings or evaluate his wounds. He presents for wound check. He admits to noncompliance with his blood pressure medications. Otherwise asymptomatic. No treatment prior to coming here. Past Medical History  Diagnosis Date  . Smoker   . Narcolepsy   . Hypertension   . BPH (benign prostatic hyperplasia)   . Arthritis   . Diabetes mellitus   . Neuropathy in diabetes   . Herpes   . Dyslipidemia   . Obesity   . Lymphedema     LE lymphedemia with hx of cellulitis.  . CKD (chronic kidney disease)   . Anemia   . History of GI bleed 06/2012    EGD unremarkable;  Colo with large cecal polyp (bx benign)  . Ischemic cardiomyopathy     Echo 06/19/12: EF 40-45%, mid to distal anteroseptal HK, moderate LAE, PASP 37  . Chronic systolic heart failure   . CAD (coronary artery disease)     a. probable underlying CAD;  b. Type 2 NSTEMi in setting of profound anemia (Hgb 3.6) from GI bleed in 06/2012 and WMA on echo;  c. med Rx due to CKD and GI bleed  . CHF (congestive heart failure)   . Atrial fibrillation    Past Surgical History  Procedure Laterality Date  . Leg surgery      Left leg surgery. broken leg  . Esophagogastroduodenoscopy  06/19/2012    Procedure: ESOPHAGOGASTRODUODENOSCOPY (EGD);  Surgeon: Beryle Beams, MD;  Location: [facility] ENDOSCOPY;  Service: Endoscopy;  Laterality: N/A;  . Colonoscopy  06/21/2012    Procedure: COLONOSCOPY;  Surgeon: Juanita Craver, MD;  Location: [facility]  ENDOSCOPY;  Service: Endoscopy;  Laterality: N/A;  . Colonoscopy Left 02/13/2014    Procedure: COLONOSCOPY;  Surgeon: Juanita Craver, MD;  Location: [place];  Service: Endoscopy;  Laterality: Left;  . Laparoscopic right colectomy Right 02/15/2014    Procedure: LAPAROSCOPIC ASISSTED RIGHT  COLECTOMY, POSSIBLE OPEN;  Surgeon: Rolm Bookbinder, MD;  Location: [place];  Service: General;  Laterality: Right;   Family History  Problem Relation Age of Onset  . Brain cancer Mother   . Diabetes type II Sister   . Narcolepsy Paternal Uncle    Social History  Substance Use Topics  . Smoking status: Former Smoker    Quit date: 08/26/1998  . Smokeless tobacco: Never Used  . Alcohol Use: No     Comment: in the past    Review of Systems  Musculoskeletal: Positive for gait problem.       Walks with walker  Skin: Positive for wound.       Chronic wounds on both shins for 2 years      Allergies  Review of patient's allergies indicates no known allergies.  Home Medications   Prior to Admission medications   Medication Sig Start Date End Date Taking? Authorizing Provider  aspirin 81 MG tablet Take 1 tablet (81 mg total) by mouth daily. 11/29/14   Nishant Dhungel, MD  carvedilol (COREG) 3.125  MG tablet Take 1 tablet (3.125 mg total) by mouth 2 (two) times daily with a meal. 11/29/14   Nishant Dhungel, MD  ferrous sulfate 325 (65 FE) MG tablet Take 1 tablet (325 mg total) by mouth 2 (two) times daily with a meal. 11/29/14   Nishant Dhungel, MD  furosemide (LASIX) 40 MG tablet Take 1 tablet (40 mg total) by mouth daily. 01/10/14   Denita Lung, MD  glimepiride (AMARYL) 2 MG tablet Take 1 tablet (2 mg total) by mouth daily with breakfast. 11/29/14   Nishant Dhungel, MD  insulin aspart (NOVOLOG) 100 UNIT/ML injection Inject 0-15 Units into the skin 3 (three) times daily with meals. 11/29/14   Nishant Dhungel, MD  insulin glargine (LANTUS) 100 UNIT/ML injection Inject 0.1 mLs (10 Units total) into the skin at  bedtime. 11/29/14   Nishant Dhungel, MD  isosorbide-hydrALAZINE (BIDIL) 20-37.5 MG per tablet Take 2 tablets by mouth 3 (three) times daily. 11/29/14   Nishant Dhungel, MD  levalbuterol (XOPENEX) 0.63 MG/3ML nebulizer solution Take 3 mLs (0.63 mg total) by nebulization every 6 (six) hours as needed for wheezing or shortness of breath. 11/29/14   Nishant Dhungel, MD   BP 215/87 mmHg  Pulse 66  Temp(Src) 98 F (36.7 C) (Oral)  Resp 18  SpO2 98% Physical Exam  Constitutional: He appears well-developed and well-nourished. No distress.  Chronically ill-appearing  HENT:  Head: Normocephalic and atraumatic.  Eyes: Conjunctivae are normal. Pupils are equal, round, and reactive to light.  Neck: Neck supple. No tracheal deviation present. No thyromegaly present.  Cardiovascular: Normal rate and regular rhythm.   No murmur heard. Pulmonary/Chest: Effort normal and breath sounds normal.  Abdominal: Soft. Bowel sounds are normal. He exhibits no distension. There is no tenderness.  Obese  Musculoskeletal: Normal range of motion. He exhibits no edema or tenderness.  Neurological: He is alert. Coordination normal.  Skin: Skin is warm and dry. No rash noted.  Bilateral lower extremities in a Una boots, but reports were removed to reveal brawny skin changes, chronic appearing with approximate 5 cm open area overlying right shin. No surrounding redness or drainage. I'm unable to detect either DP or PT pulses in either foot however feet are markedly edematous bilaterally and feet are not cool to touch  Psychiatric: He has a normal mood and affect.  Nursing note and vitals reviewed.   ED Course  Procedures (including critical care time) Labs Review Labs Reviewed - No data to display  Imaging Review No results found. I have personally reviewed and evaluated these images and lab results as part of my medical decision-making.   EKG Interpretation None       Results for orders placed or performed during  the hospital encounter of 04/17/15  CBC with Differential/Platelet  Result Value Ref Range   WBC 5.0 4.0 - 10.5 K/uL   RBC 4.25 4.22 - 5.81 MIL/uL   Hemoglobin 10.8 (L) 13.0 - 17.0 g/dL   HCT 35.1 (L) 39.0 - 52.0 %   MCV 82.6 78.0 - 100.0 fL   MCH 25.4 (L) 26.0 - 34.0 pg   MCHC 30.8 30.0 - 36.0 g/dL   RDW 15.0 11.5 - 15.5 %   Platelets 113 (L) 150 - 400 K/uL   Neutrophils Relative % 44 43 - 77 %   Neutro Abs 2.2 1.7 - 7.7 K/uL   Lymphocytes Relative 19 12 - 46 %   Lymphs Abs 1.0 0.7 - 4.0 K/uL   Monocytes Relative 36 (H)  3 - 12 %   Monocytes Absolute 1.8 (H) 0.1 - 1.0 K/uL   Eosinophils Relative 1 0 - 5 %   Eosinophils Absolute 0.1 0.0 - 0.7 K/uL   Basophils Relative 0 0 - 1 %   Basophils Absolute 0.0 0.0 - 0.1 K/uL  I-stat chem 8, ed  Result Value Ref Range   Sodium 141 135 - 145 mmol/L   Potassium 4.3 3.5 - 5.1 mmol/L   Chloride 105 101 - 111 mmol/L   BUN 29 (H) 6 - 20 mg/dL   Creatinine, Ser 2.00 (H) 0.61 - 1.24 mg/dL   Glucose, Bld 108 (H) 65 - 99 mg/dL   Calcium, Ion 1.14 1.13 - 1.30 mmol/L   TCO2 24 0 - 100 mmol/L   Hemoglobin 11.6 (L) 13.0 - 17.0 g/dL   HCT 34.0 (L) 39.0 - 52.0 %   No results found.  MDM  No signs of infection. Case management was consulted. We have arranged for home health visits for dressing changes twice weekly. He is also to follow-up with Dr.Avbuere guarding regulation of his medications. Renal insufficiency is chronic. Anemia is chronic. I spoke at length that patient needs to take his medications as prescribed as he risks heart attack and stroke from noncompliance.  Final diagnoses:  None   Diagnoses #1 chronic leg wound #2 chronic renal insufficiency #3 anemia #4 hypertension #5 medication noncompliance     Orlie Dakin, MD 04/17/15 1946

## 2015-04-17 NOTE — Care Management Note (Signed)
Case Management Note  Patient Details  Name: [name] MRN: [id number] Date of Birth: [birth year]  Subjective/Objective:     Patient presented to Ed for wound check to bilateral lower legs.               Action/Plan:  Discussed home health services for home health RN for wound care to bilateral lower legs   Expected Discharge Date: 04/17/2015                 Expected Discharge Plan:  [place]  In-House Referral:     Discharge planning Services  CM Consult  Post Acute Care Choice:    Choice offered to:  Patient  DME Arranged:   (No dme needed per patient) DME Agency:     HH Arranged:  RN, Disease Management (and bilateral lower extrmity dressing changes) HH Agency:  [facility]  Status of Service:  Completed, signed off  Medicare Important Message Given:    Date Medicare IM Given:    Medicare IM give by:    Date Additional Medicare IM Given:    Additional Medicare Important Message give by:     If discussed at [place] of Stay Meetings, dates discussed:    Additional Comments:  Patient presents to ED for bilateral lower extremity wound care check, swollen legs and feet.  Patient reports he has been seen by [facility] in the past for wound care and was very pleased with them.  Patient reports [facility] came twice a week for wound care changes and his legs were healing nicely.  Patient is requesting [facility] come to his home again for wound care changes.  Patient's friend [name] and patient both reoprt the wound care clinic "Kept putting me off."  Patient reports the wound care center cancelled his appointment three times.  Patient's friend [name] reports it would be better for the patient to receive wound care at home because, "I don't think he would make it to the wound care center."  [name] also reports the patient does not take his medications as prescribed as, "The blood pressure pills bring him down."  [facility] encouraged patient to share this  information with his pcp.  EDCM strongly advised patient to take his medications as prescribed due to high risk for stroke.  Patient reported, "I ain't gonna have no stroke!!"  Patient is diabetic and does not check his blood sugars at home, he does not have a glucometer.  Patient does not take insulin.  EDCM will place referral through to [facility] for wound care and disease management.  EDCM will also place referral into [facility] for medication management.  EDCM spoke to patient's friend [name] in the hallway.  EDCM encouraged [name] to schedule an follow up appointment for patient at Dr. Santiago Bur office.  EDCM strongly urged patient to be awake and at home when nurses come to visit him for wound care otherwise his services may be cancelled.  Patient verbalized understanding.  Patient without further needs at this time.  No further EDCM needs at this time.  Christen Bame, Deanza Upperman, RN 04/17/2015, 7:44 PM

## 2015-04-17 NOTE — Discharge Instructions (Signed)
The home health agency will contact you within 48 hours to arrange to come to your home for dressing changes and wound care. Contact Dr.Avbuere tomorrow to schedule the next available office visit. Take your medications as prescribed or you risk heart attack or stroke. Get your blood pressure recheck within a week. Today's was elevated at 210/100.

## 2015-04-17 NOTE — ED Notes (Signed)
Bed: IH53 Expected date:  Expected time:  Means of arrival:  Comments: EMS/57F/med. clearance

## 2015-04-17 NOTE — ED Notes (Signed)
Pt with wounds to bilateral leg/feet x 2-3 years.  Pt seen at wound clinic.  Having trouble getting dressing changed.  Pt told to come here to get dressing changed.  His home health was decreased when wound had gotten better.  appt keeps getting delayed.  Edema noted in bil feet

## 2015-04-18 NOTE — Progress Notes (Signed)
EDCM received phone call from [place] reporting [facility] is unable to provide services for patient.  EDCM called patient at [phone] and informed him that [facility] will not be providing home health services to him at this time and presented patient with options of other home health agencies.  Patient without preference.  EDCM informed Erasmo Downer of [facility] of referral for home health services at 1051am.  No further EDCM needs at this time.

## 2015-04-20 ENCOUNTER — Encounter (HOSPITAL_BASED_OUTPATIENT_CLINIC_OR_DEPARTMENT_OTHER): Payer: Medicare Other | Attending: Internal Medicine

## 2015-04-20 DIAGNOSIS — I509 Heart failure, unspecified: Secondary | ICD-10-CM | POA: Diagnosis not present

## 2015-04-20 DIAGNOSIS — I87333 Chronic venous hypertension (idiopathic) with ulcer and inflammation of bilateral lower extremity: Secondary | ICD-10-CM | POA: Insufficient documentation

## 2015-04-20 DIAGNOSIS — E1140 Type 2 diabetes mellitus with diabetic neuropathy, unspecified: Secondary | ICD-10-CM | POA: Diagnosis not present

## 2015-04-20 DIAGNOSIS — I2510 Atherosclerotic heart disease of native coronary artery without angina pectoris: Secondary | ICD-10-CM | POA: Diagnosis not present

## 2015-04-20 DIAGNOSIS — D649 Anemia, unspecified: Secondary | ICD-10-CM | POA: Diagnosis not present

## 2015-04-20 DIAGNOSIS — I120 Hypertensive chronic kidney disease with stage 5 chronic kidney disease or end stage renal disease: Secondary | ICD-10-CM | POA: Insufficient documentation

## 2015-04-20 DIAGNOSIS — L97221 Non-pressure chronic ulcer of left calf limited to breakdown of skin: Secondary | ICD-10-CM | POA: Diagnosis not present

## 2015-04-20 DIAGNOSIS — L97211 Non-pressure chronic ulcer of right calf limited to breakdown of skin: Secondary | ICD-10-CM | POA: Insufficient documentation

## 2015-04-20 DIAGNOSIS — N186 End stage renal disease: Secondary | ICD-10-CM | POA: Diagnosis not present

## 2015-04-27 ENCOUNTER — Encounter (HOSPITAL_BASED_OUTPATIENT_CLINIC_OR_DEPARTMENT_OTHER): Payer: Medicare Other | Attending: Internal Medicine

## 2015-04-27 DIAGNOSIS — I5022 Chronic systolic (congestive) heart failure: Secondary | ICD-10-CM | POA: Insufficient documentation

## 2015-04-27 DIAGNOSIS — E11622 Type 2 diabetes mellitus with other skin ulcer: Secondary | ICD-10-CM | POA: Insufficient documentation

## 2015-04-27 DIAGNOSIS — I87322 Chronic venous hypertension (idiopathic) with inflammation of left lower extremity: Secondary | ICD-10-CM | POA: Diagnosis not present

## 2015-04-27 DIAGNOSIS — I2510 Atherosclerotic heart disease of native coronary artery without angina pectoris: Secondary | ICD-10-CM | POA: Diagnosis not present

## 2015-04-27 DIAGNOSIS — D649 Anemia, unspecified: Secondary | ICD-10-CM | POA: Insufficient documentation

## 2015-04-27 DIAGNOSIS — E1140 Type 2 diabetes mellitus with diabetic neuropathy, unspecified: Secondary | ICD-10-CM | POA: Diagnosis not present

## 2015-04-27 DIAGNOSIS — L97811 Non-pressure chronic ulcer of other part of right lower leg limited to breakdown of skin: Secondary | ICD-10-CM | POA: Insufficient documentation

## 2015-04-27 DIAGNOSIS — I87331 Chronic venous hypertension (idiopathic) with ulcer and inflammation of right lower extremity: Secondary | ICD-10-CM | POA: Insufficient documentation

## 2015-04-27 DIAGNOSIS — N186 End stage renal disease: Secondary | ICD-10-CM | POA: Insufficient documentation

## 2015-04-27 DIAGNOSIS — I482 Chronic atrial fibrillation: Secondary | ICD-10-CM | POA: Insufficient documentation

## 2015-04-27 DIAGNOSIS — I120 Hypertensive chronic kidney disease with stage 5 chronic kidney disease or end stage renal disease: Secondary | ICD-10-CM | POA: Insufficient documentation

## 2015-05-04 DIAGNOSIS — D649 Anemia, unspecified: Secondary | ICD-10-CM | POA: Diagnosis not present

## 2015-05-04 DIAGNOSIS — L97811 Non-pressure chronic ulcer of other part of right lower leg limited to breakdown of skin: Secondary | ICD-10-CM | POA: Diagnosis not present

## 2015-05-04 DIAGNOSIS — I87331 Chronic venous hypertension (idiopathic) with ulcer and inflammation of right lower extremity: Secondary | ICD-10-CM | POA: Diagnosis not present

## 2015-05-04 DIAGNOSIS — I2510 Atherosclerotic heart disease of native coronary artery without angina pectoris: Secondary | ICD-10-CM | POA: Diagnosis not present

## 2015-05-11 DIAGNOSIS — L97811 Non-pressure chronic ulcer of other part of right lower leg limited to breakdown of skin: Secondary | ICD-10-CM | POA: Diagnosis not present

## 2015-05-11 DIAGNOSIS — D649 Anemia, unspecified: Secondary | ICD-10-CM | POA: Diagnosis not present

## 2015-05-11 DIAGNOSIS — I87331 Chronic venous hypertension (idiopathic) with ulcer and inflammation of right lower extremity: Secondary | ICD-10-CM | POA: Diagnosis not present

## 2015-05-11 DIAGNOSIS — I2510 Atherosclerotic heart disease of native coronary artery without angina pectoris: Secondary | ICD-10-CM | POA: Diagnosis not present

## 2015-05-18 ENCOUNTER — Emergency Department (HOSPITAL_COMMUNITY)
Admission: EM | Admit: 2015-05-18 | Discharge: 2015-05-18 | Disposition: A | Discharge status: Home / Self Care | Payer: Medicare Other | Attending: Emergency Medicine | Admitting: Emergency Medicine

## 2015-05-18 ENCOUNTER — Encounter (HOSPITAL_COMMUNITY): Payer: Self-pay | Admitting: Emergency Medicine

## 2015-05-18 DIAGNOSIS — I129 Hypertensive chronic kidney disease with stage 1 through stage 4 chronic kidney disease, or unspecified chronic kidney disease: Secondary | ICD-10-CM | POA: Insufficient documentation

## 2015-05-18 DIAGNOSIS — E1140 Type 2 diabetes mellitus with diabetic neuropathy, unspecified: Secondary | ICD-10-CM | POA: Diagnosis not present

## 2015-05-18 DIAGNOSIS — N189 Chronic kidney disease, unspecified: Secondary | ICD-10-CM | POA: Insufficient documentation

## 2015-05-18 DIAGNOSIS — I2510 Atherosclerotic heart disease of native coronary artery without angina pectoris: Secondary | ICD-10-CM | POA: Diagnosis not present

## 2015-05-18 DIAGNOSIS — E785 Hyperlipidemia, unspecified: Secondary | ICD-10-CM | POA: Insufficient documentation

## 2015-05-18 DIAGNOSIS — Z8619 Personal history of other infectious and parasitic diseases: Secondary | ICD-10-CM | POA: Insufficient documentation

## 2015-05-18 DIAGNOSIS — E669 Obesity, unspecified: Secondary | ICD-10-CM | POA: Insufficient documentation

## 2015-05-18 DIAGNOSIS — I4891 Unspecified atrial fibrillation: Secondary | ICD-10-CM | POA: Diagnosis not present

## 2015-05-18 DIAGNOSIS — Z79899 Other long term (current) drug therapy: Secondary | ICD-10-CM | POA: Insufficient documentation

## 2015-05-18 DIAGNOSIS — I5022 Chronic systolic (congestive) heart failure: Secondary | ICD-10-CM | POA: Insufficient documentation

## 2015-05-18 DIAGNOSIS — Z87448 Personal history of other diseases of urinary system: Secondary | ICD-10-CM | POA: Insufficient documentation

## 2015-05-18 DIAGNOSIS — Z7982 Long term (current) use of aspirin: Secondary | ICD-10-CM | POA: Insufficient documentation

## 2015-05-18 DIAGNOSIS — Z794 Long term (current) use of insulin: Secondary | ICD-10-CM | POA: Diagnosis not present

## 2015-05-18 DIAGNOSIS — K5900 Constipation, unspecified: Secondary | ICD-10-CM | POA: Diagnosis present

## 2015-05-18 DIAGNOSIS — D649 Anemia, unspecified: Secondary | ICD-10-CM | POA: Insufficient documentation

## 2015-05-18 DIAGNOSIS — Z8669 Personal history of other diseases of the nervous system and sense organs: Secondary | ICD-10-CM | POA: Diagnosis not present

## 2015-05-18 DIAGNOSIS — K5902 Outlet dysfunction constipation: Secondary | ICD-10-CM

## 2015-05-18 DIAGNOSIS — Z87891 Personal history of nicotine dependence: Secondary | ICD-10-CM | POA: Insufficient documentation

## 2015-05-18 DIAGNOSIS — I160 Hypertensive urgency: Secondary | ICD-10-CM

## 2015-05-18 DIAGNOSIS — M1990 Unspecified osteoarthritis, unspecified site: Secondary | ICD-10-CM | POA: Insufficient documentation

## 2015-05-18 LAB — CBC WITH DIFFERENTIAL/PLATELET
Basophils Absolute: 0 10*3/uL (ref 0.0–0.1)
Basophils Relative: 0 %
EOS ABS: 0 10*3/uL (ref 0.0–0.7)
EOS PCT: 1 %
HCT: 35.1 % — ABNORMAL LOW (ref 39.0–52.0)
Hemoglobin: 11.3 g/dL — ABNORMAL LOW (ref 13.0–17.0)
LYMPHS ABS: 0.6 10*3/uL — AB (ref 0.7–4.0)
Lymphocytes Relative: 11 %
MCH: 26.9 pg (ref 26.0–34.0)
MCHC: 32.2 g/dL (ref 30.0–36.0)
MCV: 83.6 fL (ref 78.0–100.0)
MONOS PCT: 33 %
Monocytes Absolute: 1.7 10*3/uL — ABNORMAL HIGH (ref 0.1–1.0)
Neutro Abs: 2.8 10*3/uL (ref 1.7–7.7)
Neutrophils Relative %: 55 %
Platelets: 121 10*3/uL — ABNORMAL LOW (ref 150–400)
RBC: 4.2 MIL/uL — ABNORMAL LOW (ref 4.22–5.81)
RDW: 15.1 % (ref 11.5–15.5)
WBC: 5.1 10*3/uL (ref 4.0–10.5)

## 2015-05-18 LAB — I-STAT CHEM 8, ED
BUN: 25 mg/dL — AB (ref 6–20)
CHLORIDE: 103 mmol/L (ref 101–111)
CREATININE: 1.8 mg/dL — AB (ref 0.61–1.24)
Calcium, Ion: 1.15 mmol/L (ref 1.13–1.30)
Glucose, Bld: 180 mg/dL — ABNORMAL HIGH (ref 65–99)
HEMATOCRIT: 37 % — AB (ref 39.0–52.0)
Hemoglobin: 12.6 g/dL — ABNORMAL LOW (ref 13.0–17.0)
POTASSIUM: 3.7 mmol/L (ref 3.5–5.1)
Sodium: 142 mmol/L (ref 135–145)
TCO2: 26 mmol/L (ref 0–100)

## 2015-05-18 LAB — URINALYSIS, ROUTINE W REFLEX MICROSCOPIC
Bilirubin Urine: NEGATIVE
Glucose, UA: 250 mg/dL — AB
Ketones, ur: NEGATIVE mg/dL
LEUKOCYTES UA: NEGATIVE
NITRITE: NEGATIVE
PH: 7 (ref 5.0–8.0)
Protein, ur: >300 mg/dL — AB
Specific Gravity, Urine: 1.013 (ref 1.005–1.030)
Urobilinogen, UA: 1 mg/dL (ref 0.0–1.0)

## 2015-05-18 LAB — CBG MONITORING, ED: Glucose-Capillary: 162 mg/dL — ABNORMAL HIGH (ref 65–99)

## 2015-05-18 LAB — URINE MICROSCOPIC-ADD ON

## 2015-05-18 MED ORDER — POLYETHYLENE GLYCOL 3350 17 G PO PACK: 17.0000 g | PACK | Freq: Every day | ORAL | Status: DC

## 2015-05-18 MED ORDER — MINERAL OIL RE ENEM
1.0000 | ENEMA | Freq: Once | RECTAL | Status: AC
  Administered 2015-05-18: 1 via RECTAL
  Filled 2015-05-18: qty 1

## 2015-05-18 MED ORDER — BISACODYL 10 MG RE SUPP: 10.0000 mg | RECTAL | Status: DC | PRN

## 2015-05-18 MED ORDER — CARVEDILOL 12.5 MG PO TABS
12.5000 mg | ORAL_TABLET | Freq: Two times a day (BID) | ORAL | Status: DC
  Filled 2015-05-18 (×2): qty 1

## 2015-05-18 NOTE — ED Notes (Signed)
Pt called EMS from home, having trouble going to the bathroom until he started having constant pain.  Per EMS he feels like he has to defecate when standing up but upon sitting is not able to finish.  EMS states he is taking BP meds but has not taken them and today he presents with 256/146 per EMS.  He asked to come here today because he had a wound clinic visit later today and cannot recall the physician nor his appointment time.

## 2015-05-18 NOTE — ED Notes (Signed)
Pt unable to get a hold of daughter to pick pt up.   Made [facility] aware of transport back to pt's residence.

## 2015-05-18 NOTE — Discharge Instructions (Signed)
We disimpacted your stools and gave you an enema in the ER. Please take miralax or a stool softener for the constpation and see your doctor in 1 week.   Constipation Constipation is when a person has fewer than three bowel movements a week, has difficulty having a bowel movement, or has stools that are dry, hard, or larger than normal. As people grow older, constipation is more common. If you try to fix constipation with medicines that make you have a bowel movement (laxatives), the problem may get worse. Long-term laxative use may cause the muscles of the colon to become weak. A low-fiber diet, not taking in enough fluids, and taking certain medicines may make constipation worse.  CAUSES   Certain medicines, such as antidepressants, pain medicine, iron supplements, antacids, and water pills.   Certain diseases, such as diabetes, irritable bowel syndrome (IBS), thyroid disease, or depression.   Not drinking enough water.   Not eating enough fiber-rich foods.   Stress or travel.   Lack of physical activity or exercise.   Ignoring the urge to have a bowel movement.   Using laxatives too much.  SIGNS AND SYMPTOMS   Having fewer than three bowel movements a week.   Straining to have a bowel movement.   Having stools that are hard, dry, or larger than normal.   Feeling full or bloated.   Pain in the lower abdomen.   Not feeling relief after having a bowel movement.  DIAGNOSIS  Your health care provider will take a medical history and perform a physical exam. Further testing may be done for severe constipation. Some tests may include:  A barium enema X-ray to examine your rectum, colon, and, sometimes, your small intestine.   A sigmoidoscopy to examine your lower colon.   A colonoscopy to examine your entire colon. TREATMENT  Treatment will depend on the severity of your constipation and what is causing it. Some dietary treatments include drinking more fluids  and eating more fiber-rich foods. Lifestyle treatments may include regular exercise. If these diet and lifestyle recommendations do not help, your health care provider may recommend taking over-the-counter laxative medicines to help you have bowel movements. Prescription medicines may be prescribed if over-the-counter medicines do not work.  HOME CARE INSTRUCTIONS   Eat foods that have a lot of fiber, such as fruits, vegetables, whole grains, and beans.  Limit foods high in fat and processed sugars, such as french fries, hamburgers, cookies, candies, and soda.   A fiber supplement may be added to your diet if you cannot get enough fiber from foods.   Drink enough fluids to keep your urine clear or pale yellow.   Exercise regularly or as directed by your health care provider.   Go to the restroom when you have the urge to go. Do not hold it.   Only take over-the-counter or prescription medicines as directed by your health care provider. Do not take other medicines for constipation without talking to your health care provider first.  [place] IF:   You have bright red blood in your stool.   Your constipation lasts for more than 4 days or gets worse.   You have abdominal or rectal pain.   You have thin, pencil-like stools.   You have unexplained weight loss. MAKE SURE YOU:   Understand these instructions.  Will watch your condition.  Will get help right away if you are not doing well or get worse. Document Released: 05/10/2004 Document Revised: 08/17/2013  Document Reviewed: 05/24/2013 [facility] Patient Information 2015 [place], [place]. This information is not intended to replace advice given to you by your health care provider. Make sure you discuss any questions you have with your health care provider.

## 2015-05-18 NOTE — ED Provider Notes (Signed)
CSN: [id number]     Arrival date & time 05/18/15  1305 History   First MD Initiated Contact with Patient 05/18/15 1314     Chief Complaint  Patient presents with  . Constipation     (Consider location/radiation/quality/duration/timing/severity/associated sxs/prior Treatment) HPI Comments: Pt reports that he went to the bathroom, and he just couldn't get the stool out. It felt to him that the stool was stuck in his rectum. His last BM was yday. He has no constipation problems, no abd surgery. He has no abd pain as a complain for me. No pain meds. No constipation meds.  Also noted to have elevated BP. Admits that he has not taken his BP meds. No chest pain, numbness, tingling, focal weakness.  Patient is a 81 y.o. male presenting with constipation. The history is provided by the patient.  Constipation Severity:  Moderate Time since last bowel movement:  1 day Chronicity:  New Context: not dehydration, not dietary changes, not medication and not narcotics   Stool description:  Hard and formed Ineffective treatments:  Defecation Associated symptoms: no abdominal pain, no back pain, no dysuria, no fever, no nausea and no vomiting     Past Medical History  Diagnosis Date  . Smoker   . Narcolepsy   . Hypertension   . BPH (benign prostatic hyperplasia)   . Arthritis   . Diabetes mellitus   . Neuropathy in diabetes   . Herpes   . Dyslipidemia   . Obesity   . Lymphedema     LE lymphedemia with hx of cellulitis.  . CKD (chronic kidney disease)   . Anemia   . History of GI bleed 06/2012    EGD unremarkable;  Colo with large cecal polyp (bx benign)  . Ischemic cardiomyopathy     Echo 06/19/12: EF 40-45%, mid to distal anteroseptal HK, moderate LAE, PASP 37  . Chronic systolic heart failure   . CAD (coronary artery disease)     a. probable underlying CAD;  b. Type 2 NSTEMi in setting of profound anemia (Hgb 3.6) from GI bleed in 06/2012 and WMA on echo;  c. med Rx due to CKD and GI  bleed  . CHF (congestive heart failure)   . Atrial fibrillation    Past Surgical History  Procedure Laterality Date  . Leg surgery      Left leg surgery. broken leg  . Esophagogastroduodenoscopy  06/19/2012    Procedure: ESOPHAGOGASTRODUODENOSCOPY (EGD);  Surgeon: Beryle Beams, MD;  Location: [facility] ENDOSCOPY;  Service: Endoscopy;  Laterality: N/A;  . Colonoscopy  06/21/2012    Procedure: COLONOSCOPY;  Surgeon: Juanita Craver, MD;  Location: [facility] ENDOSCOPY;  Service: Endoscopy;  Laterality: N/A;  . Colonoscopy Left 02/13/2014    Procedure: COLONOSCOPY;  Surgeon: Juanita Craver, MD;  Location: [place];  Service: Endoscopy;  Laterality: Left;  . Laparoscopic right colectomy Right 02/15/2014    Procedure: LAPAROSCOPIC ASISSTED RIGHT  COLECTOMY, POSSIBLE OPEN;  Surgeon: Rolm Bookbinder, MD;  Location: [place];  Service: General;  Laterality: Right;   Family History  Problem Relation Age of Onset  . Brain cancer Mother   . Diabetes type II Sister   . Narcolepsy Paternal Uncle    Social History  Substance Use Topics  . Smoking status: Former Smoker    Quit date: 08/26/1998  . Smokeless tobacco: Never Used  . Alcohol Use: No     Comment: in the past    Review of Systems  Constitutional: Negative for fever.  Gastrointestinal:  Positive for constipation. Negative for nausea, vomiting and abdominal pain.  Genitourinary: Negative for dysuria.  Musculoskeletal: Negative for back pain.    ROS 10 Systems reviewed and are negative for acute change except as noted in the HPI.      Allergies  Review of patient's allergies indicates no known allergies.  Home Medications   Prior to Admission medications   Medication Sig Start Date End Date Taking? Authorizing Provider  carvedilol (COREG) 12.5 MG tablet Take 12.5 mg by mouth 2 (two) times daily with a meal.    Yes Historical Provider, MD  ferrous sulfate 325 (65 FE) MG tablet Take 1 tablet (325 mg total) by mouth 2 (two) times daily with a  meal. 11/29/14  Yes Nishant Dhungel, MD  furosemide (LASIX) 40 MG tablet Take 1 tablet (40 mg total) by mouth daily. 01/10/14  Yes Denita Lung, MD  aspirin 81 MG tablet Take 1 tablet (81 mg total) by mouth daily. 11/29/14   Nishant Dhungel, MD  glimepiride (AMARYL) 2 MG tablet Take 1 tablet (2 mg total) by mouth daily with breakfast. 11/29/14   Nishant Dhungel, MD  insulin aspart (NOVOLOG) 100 UNIT/ML injection Inject 0-15 Units into the skin 3 (three) times daily with meals. Patient not taking: Reported on 04/17/2015 11/29/14   Nishant Dhungel, MD  insulin glargine (LANTUS) 100 UNIT/ML injection Inject 0.1 mLs (10 Units total) into the skin at bedtime. Patient not taking: Reported on 04/17/2015 11/29/14   Nishant Dhungel, MD  isosorbide-hydrALAZINE (BIDIL) 20-37.5 MG per tablet Take 2 tablets by mouth 3 (three) times daily. Patient not taking: Reported on 04/17/2015 11/29/14   Nishant Dhungel, MD  levalbuterol (XOPENEX) 0.63 MG/3ML nebulizer solution Take 3 mLs (0.63 mg total) by nebulization every 6 (six) hours as needed for wheezing or shortness of breath. 11/29/14   Nishant Dhungel, MD  polyethylene glycol (MIRALAX) packet Take 17 g by mouth daily. 05/18/15   Ankit Kathrynn Humble, MD   BP 202/81 mmHg  Pulse 78  Temp(Src) 98.7 F (37.1 C) (Oral)  Resp 20  Ht 5\' 10"  (1.778 m)  Wt 240 lb (108.863 kg)  BMI 34.44 kg/m2  SpO2 96% Physical Exam  Constitutional: He is oriented to person, place, and time. He appears well-developed.  HENT:  Head: Atraumatic.  Neck: Neck supple.  Cardiovascular: Normal rate.   Pulmonary/Chest: Effort normal. He has no wheezes.  Abdominal: He exhibits no distension. There is no tenderness. There is no rebound and no guarding.  Pt has soft but large stool balls in the rectal vault  Neurological: He is alert and oriented to person, place, and time.  Skin: Skin is warm.  Nursing note and vitals reviewed.   ED Course  Fecal disimpaction Date/Time: 05/18/2015 3:52 PM Performed  by: Varney Biles Authorized by: Varney Biles Consent: Verbal consent obtained. Risks and benefits: risks, benefits and alternatives were discussed Consent given by: patient Patient understanding: patient states understanding of the procedure being performed Patient identity confirmed: arm band Local anesthesia used: no Patient sedated: no Patient tolerance: Patient tolerated the procedure well with no immediate complications Comments: Large amount of stool disimpacted from the rectal vault   (including critical care time) Labs Review Labs Reviewed  CBC WITH DIFFERENTIAL/PLATELET - Abnormal; Notable for the following:    RBC 4.20 (*)    Hemoglobin 11.3 (*)    HCT 35.1 (*)    Platelets 121 (*)    Lymphs Abs 0.6 (*)    Monocytes Absolute 1.7 (*)  All other components within normal limits  URINALYSIS, ROUTINE W REFLEX MICROSCOPIC (NOT AT [facility]) - Abnormal; Notable for the following:    Glucose, UA 250 (*)    Hgb urine dipstick MODERATE (*)    Protein, ur >300 (*)    All other components within normal limits  I-STAT CHEM 8, ED - Abnormal; Notable for the following:    BUN 25 (*)    Creatinine, Ser 1.80 (*)    Glucose, Bld 180 (*)    Hemoglobin 12.6 (*)    HCT 37.0 (*)    All other components within normal limits  CBG MONITORING, ED - Abnormal; Notable for the following:    Glucose-Capillary 162 (*)    All other components within normal limits  URINE MICROSCOPIC-ADD ON    Imaging Review No results found. I have personally reviewed and evaluated these images and lab results as part of my medical decision-making.   EKG Interpretation None      MDM   Final diagnoses:  Constipation due to outlet dysfunction    Pt comes in with constipation. Stools disimpacted. Last BM was yday, passing flatus. Abd exam is benign. No nausea, emesis. No indication for imaging - as i dont think he has mechanical obstruction, or megacolon at the present time. Oral and rectal meds  given.  BP is elevated. Didn't take his meds. He is asymptomatic. Will give his home meds. PCP f/u in 3-7 days recommended.  Varney Biles, MD 05/18/15 1601

## 2015-05-18 NOTE — ED Notes (Signed)
Bed: RA74 Expected date:  Expected time:  Means of arrival:  Comments: EMS- 81yo M, constipation/rectal pain/HTN

## 2015-05-23 DIAGNOSIS — D649 Anemia, unspecified: Secondary | ICD-10-CM | POA: Diagnosis not present

## 2015-05-23 DIAGNOSIS — I2510 Atherosclerotic heart disease of native coronary artery without angina pectoris: Secondary | ICD-10-CM | POA: Diagnosis not present

## 2015-05-23 DIAGNOSIS — I87331 Chronic venous hypertension (idiopathic) with ulcer and inflammation of right lower extremity: Secondary | ICD-10-CM | POA: Diagnosis not present

## 2015-05-23 DIAGNOSIS — L97811 Non-pressure chronic ulcer of other part of right lower leg limited to breakdown of skin: Secondary | ICD-10-CM | POA: Diagnosis not present

## 2015-06-01 ENCOUNTER — Encounter (HOSPITAL_BASED_OUTPATIENT_CLINIC_OR_DEPARTMENT_OTHER): Payer: Medicare Other | Attending: Internal Medicine

## 2015-06-01 DIAGNOSIS — I872 Venous insufficiency (chronic) (peripheral): Secondary | ICD-10-CM | POA: Diagnosis not present

## 2015-06-01 DIAGNOSIS — E1122 Type 2 diabetes mellitus with diabetic chronic kidney disease: Secondary | ICD-10-CM | POA: Diagnosis not present

## 2015-06-01 DIAGNOSIS — I2510 Atherosclerotic heart disease of native coronary artery without angina pectoris: Secondary | ICD-10-CM | POA: Diagnosis not present

## 2015-06-01 DIAGNOSIS — E1140 Type 2 diabetes mellitus with diabetic neuropathy, unspecified: Secondary | ICD-10-CM | POA: Diagnosis not present

## 2015-06-01 DIAGNOSIS — L97821 Non-pressure chronic ulcer of other part of left lower leg limited to breakdown of skin: Secondary | ICD-10-CM | POA: Diagnosis not present

## 2015-06-01 DIAGNOSIS — I509 Heart failure, unspecified: Secondary | ICD-10-CM | POA: Diagnosis not present

## 2015-06-01 DIAGNOSIS — I132 Hypertensive heart and chronic kidney disease with heart failure and with stage 5 chronic kidney disease, or end stage renal disease: Secondary | ICD-10-CM | POA: Diagnosis not present

## 2015-06-01 DIAGNOSIS — N186 End stage renal disease: Secondary | ICD-10-CM | POA: Diagnosis not present

## 2015-06-01 DIAGNOSIS — R609 Edema, unspecified: Secondary | ICD-10-CM | POA: Diagnosis not present

## 2015-06-09 DIAGNOSIS — L97821 Non-pressure chronic ulcer of other part of left lower leg limited to breakdown of skin: Secondary | ICD-10-CM | POA: Diagnosis not present

## 2015-06-09 DIAGNOSIS — I2510 Atherosclerotic heart disease of native coronary artery without angina pectoris: Secondary | ICD-10-CM | POA: Diagnosis not present

## 2015-06-09 DIAGNOSIS — I509 Heart failure, unspecified: Secondary | ICD-10-CM | POA: Diagnosis not present

## 2015-06-09 DIAGNOSIS — E1140 Type 2 diabetes mellitus with diabetic neuropathy, unspecified: Secondary | ICD-10-CM | POA: Diagnosis not present

## 2015-06-16 DIAGNOSIS — E1140 Type 2 diabetes mellitus with diabetic neuropathy, unspecified: Secondary | ICD-10-CM | POA: Diagnosis not present

## 2015-06-16 DIAGNOSIS — I509 Heart failure, unspecified: Secondary | ICD-10-CM | POA: Diagnosis not present

## 2015-06-16 DIAGNOSIS — I2510 Atherosclerotic heart disease of native coronary artery without angina pectoris: Secondary | ICD-10-CM | POA: Diagnosis not present

## 2015-06-16 DIAGNOSIS — L97821 Non-pressure chronic ulcer of other part of left lower leg limited to breakdown of skin: Secondary | ICD-10-CM | POA: Diagnosis not present

## 2015-06-19 ENCOUNTER — Encounter (HOSPITAL_COMMUNITY): Payer: Self-pay

## 2015-06-19 ENCOUNTER — Emergency Department (HOSPITAL_COMMUNITY)
Admission: EM | Admit: 2015-06-19 | Discharge: 2015-06-20 | Disposition: A | Discharge status: Home / Self Care | Payer: Medicare Other | Attending: Emergency Medicine | Admitting: Emergency Medicine

## 2015-06-19 DIAGNOSIS — Z8669 Personal history of other diseases of the nervous system and sense organs: Secondary | ICD-10-CM | POA: Diagnosis not present

## 2015-06-19 DIAGNOSIS — Z79899 Other long term (current) drug therapy: Secondary | ICD-10-CM | POA: Insufficient documentation

## 2015-06-19 DIAGNOSIS — Z8619 Personal history of other infectious and parasitic diseases: Secondary | ICD-10-CM | POA: Diagnosis not present

## 2015-06-19 DIAGNOSIS — I2510 Atherosclerotic heart disease of native coronary artery without angina pectoris: Secondary | ICD-10-CM | POA: Diagnosis not present

## 2015-06-19 DIAGNOSIS — I4891 Unspecified atrial fibrillation: Secondary | ICD-10-CM | POA: Diagnosis not present

## 2015-06-19 DIAGNOSIS — Z87438 Personal history of other diseases of male genital organs: Secondary | ICD-10-CM | POA: Insufficient documentation

## 2015-06-19 DIAGNOSIS — I10 Essential (primary) hypertension: Secondary | ICD-10-CM

## 2015-06-19 DIAGNOSIS — N189 Chronic kidney disease, unspecified: Secondary | ICD-10-CM | POA: Insufficient documentation

## 2015-06-19 DIAGNOSIS — R040 Epistaxis: Secondary | ICD-10-CM

## 2015-06-19 DIAGNOSIS — E669 Obesity, unspecified: Secondary | ICD-10-CM | POA: Insufficient documentation

## 2015-06-19 DIAGNOSIS — D649 Anemia, unspecified: Secondary | ICD-10-CM | POA: Insufficient documentation

## 2015-06-19 DIAGNOSIS — M1990 Unspecified osteoarthritis, unspecified site: Secondary | ICD-10-CM | POA: Diagnosis not present

## 2015-06-19 DIAGNOSIS — I129 Hypertensive chronic kidney disease with stage 1 through stage 4 chronic kidney disease, or unspecified chronic kidney disease: Secondary | ICD-10-CM | POA: Diagnosis not present

## 2015-06-19 DIAGNOSIS — E119 Type 2 diabetes mellitus without complications: Secondary | ICD-10-CM | POA: Diagnosis not present

## 2015-06-19 DIAGNOSIS — Z87891 Personal history of nicotine dependence: Secondary | ICD-10-CM | POA: Insufficient documentation

## 2015-06-19 DIAGNOSIS — I509 Heart failure, unspecified: Secondary | ICD-10-CM | POA: Diagnosis not present

## 2015-06-19 NOTE — Progress Notes (Signed)
ED CM  Met with patient at bedside this patient is known to this CM. Patient presented to [facility] ED with elevated B/P. Patient reports he saw Dr. Jeanie Cooks PCP last week. On evaluation patient stated that he is living in a bug infested apartment with his daughter and her boyfriend, and wants to leave. CM disscussed possible ALF placement once again, and patient adamantly rejects that idea. Patient states, "I want my own apartment, so I could do as I damn well please."  Updated Dr. Lacinda Axon and [place] RN.  Patient agrees to return home once cleared for discharge. No further CM needs identified.

## 2015-06-19 NOTE — ED Notes (Signed)
Pt arrived via [facility] from home c/o HTN but noncompliant with medications at home.  Nosebleed that was controlled when EMS arrived.  No c/o headache at this time resolved by ED arrival.  Denies SOB, N/V, dizziness at this time.  EMS 12-lead shows 1st degree AV block.

## 2015-06-20 MED ORDER — ACETAMINOPHEN 500 MG PO TABS
1000.0000 mg | ORAL_TABLET | Freq: Once | ORAL | Status: AC
  Administered 2015-06-20: 1000 mg via ORAL
  Filled 2015-06-20: qty 2

## 2015-06-20 NOTE — Discharge Instructions (Signed)
For your nose bleed, recommend vaporizer in your bedroom. Also recommend saline nose drops and Neosporin ointment to nose. Follow-up your primary care doctor.

## 2015-06-20 NOTE — ED Notes (Signed)
Pt stable, ambulatory, states understanding of discharge instructions, attempted to call daughter from numbers in the chart, pt doesn't know daughters number.  PTAR called.

## 2015-06-20 NOTE — ED Notes (Addendum)
Daughter Sondra Barges    [phone]

## 2015-06-22 NOTE — ED Provider Notes (Signed)
CSN: [id number]     Arrival date & time 06/19/15  1959 History   First MD Initiated Contact with Patient 06/19/15 2028     Chief Complaint  Patient presents with  . Hypertension  . Epistaxis  . Headache     (Consider location/radiation/quality/duration/timing/severity/associated sxs/prior Treatment) HPI...... patient reports with headache and nosebleed. On further questioning, it appears that his biggest concern is his living situation at home with his daughter. Headache is usual. No chest pain, dyspnea, neurodeficits, fever, sweats, chills, dysuria. He has multiple health problems well documented. He is alert and not dyspneic.  Past Medical History  Diagnosis Date  . Smoker   . Narcolepsy   . Hypertension   . BPH (benign prostatic hyperplasia)   . Arthritis   . Diabetes mellitus   . Neuropathy in diabetes ([place])   . Herpes   . Dyslipidemia   . Obesity   . Lymphedema     LE lymphedemia with hx of cellulitis.  . CKD (chronic kidney disease)   . Anemia   . History of GI bleed 06/2012    EGD unremarkable;  Colo with large cecal polyp (bx benign)  . Ischemic cardiomyopathy     Echo 06/19/12: EF 40-45%, mid to distal anteroseptal HK, moderate LAE, PASP 37  . Chronic systolic heart failure ([place])   . CAD (coronary artery disease)     a. probable underlying CAD;  b. Type 2 NSTEMi in setting of profound anemia (Hgb 3.6) from GI bleed in 06/2012 and WMA on echo;  c. med Rx due to CKD and GI bleed  . CHF (congestive heart failure) ([place])   . Atrial fibrillation [facility])    Past Surgical History  Procedure Laterality Date  . Leg surgery      Left leg surgery. broken leg  . Esophagogastroduodenoscopy  06/19/2012    Procedure: ESOPHAGOGASTRODUODENOSCOPY (EGD);  Surgeon: Beryle Beams, MD;  Location: [facility] ENDOSCOPY;  Service: Endoscopy;  Laterality: N/A;  . Colonoscopy  06/21/2012    Procedure: COLONOSCOPY;  Surgeon: Juanita Craver, MD;  Location: [facility] ENDOSCOPY;  Service: Endoscopy;   Laterality: N/A;  . Colonoscopy Left 02/13/2014    Procedure: COLONOSCOPY;  Surgeon: Juanita Craver, MD;  Location: [place];  Service: Endoscopy;  Laterality: Left;  . Laparoscopic right colectomy Right 02/15/2014    Procedure: LAPAROSCOPIC ASISSTED RIGHT  COLECTOMY, POSSIBLE OPEN;  Surgeon: Rolm Bookbinder, MD;  Location: [place];  Service: General;  Laterality: Right;   Family History  Problem Relation Age of Onset  . Brain cancer Mother   . Diabetes type II Sister   . Narcolepsy Paternal Uncle    Social History  Substance Use Topics  . Smoking status: Former Smoker    Quit date: 08/26/1998  . Smokeless tobacco: Never Used  . Alcohol Use: No     Comment: in the past    Review of Systems  All other systems reviewed and are negative.     Allergies  Review of patient's allergies indicates no known allergies.  Home Medications   Prior to Admission medications   Medication Sig Start Date End Date Taking? Authorizing Provider  carvedilol (COREG) 12.5 MG tablet Take 12.5 mg by mouth 2 (two) times daily with a meal.    Yes Historical Provider, MD  ferrous sulfate 325 (65 FE) MG tablet Take 1 tablet (325 mg total) by mouth 2 (two) times daily with a meal. 11/29/14  Yes Nishant Dhungel, MD  furosemide (LASIX) 40 MG tablet Take 1 tablet (  40 mg total) by mouth daily. 01/10/14  Yes Denita Lung, MD  glimepiride (AMARYL) 2 MG tablet Take 1 tablet (2 mg total) by mouth daily with breakfast. 11/29/14  Yes Nishant Dhungel, MD  levalbuterol (XOPENEX) 0.63 MG/3ML nebulizer solution Take 3 mLs (0.63 mg total) by nebulization every 6 (six) hours as needed for wheezing or shortness of breath. 11/29/14  Yes Nishant Dhungel, MD  polyethylene glycol (MIRALAX) packet Take 17 g by mouth daily. 05/18/15  Yes Varney Biles, MD  aspirin 81 MG tablet Take 1 tablet (81 mg total) by mouth daily. Patient not taking: Reported on 06/19/2015 11/29/14   Nishant Dhungel, MD  bisacodyl (DULCOLAX) 10 MG suppository  Place 1 suppository (10 mg total) rectally as needed for moderate constipation. Patient not taking: Reported on 06/19/2015 05/18/15   Varney Biles, MD  insulin aspart (NOVOLOG) 100 UNIT/ML injection Inject 0-15 Units into the skin 3 (three) times daily with meals. Patient not taking: Reported on 04/17/2015 11/29/14   Nishant Dhungel, MD  insulin glargine (LANTUS) 100 UNIT/ML injection Inject 0.1 mLs (10 Units total) into the skin at bedtime. Patient not taking: Reported on 04/17/2015 11/29/14   Nishant Dhungel, MD  isosorbide-hydrALAZINE (BIDIL) 20-37.5 MG per tablet Take 2 tablets by mouth 3 (three) times daily. Patient not taking: Reported on 04/17/2015 11/29/14   Nishant Dhungel, MD   BP 187/96 mmHg  Pulse 75  Resp 15  SpO2 98% Physical Exam  Constitutional: He is oriented to person, place, and time.  Hypertensive, no acute distress  HENT:  Head: Normocephalic and atraumatic.  No active bleeding in nose  Eyes: Conjunctivae and EOM are normal. Pupils are equal, round, and reactive to light.  Neck: Normal range of motion. Neck supple.  Cardiovascular: Normal rate and regular rhythm.   Pulmonary/Chest: Effort normal and breath sounds normal.  Abdominal: Soft. Bowel sounds are normal.  Musculoskeletal: Normal range of motion.  Neurological: He is alert and oriented to person, place, and time.  Skin: Skin is warm and dry.  Psychiatric: He has a normal mood and affect. His behavior is normal.  Nursing note and vitals reviewed.   ED Course  Procedures (including critical care time) Labs Review Labs Reviewed - No data to display  Imaging Review No results found. I have personally reviewed and evaluated these images and lab results as part of my medical decision-making.   EKG Interpretation   Date/Time:  Monday June 19 2015 20:30:19 EDT Ventricular Rate:  74 PR Interval:  65 QRS Duration: 86 QT Interval:  468 QTC Calculation: 519 R Axis:   -16 Text Interpretation:  Sinus or  ectopic atrial rhythm Sinus pause Short PR  interval Borderline left axis deviation Abnormal T, consider ischemia,  lateral leads Borderline ST elevation, anterior leads Prolonged QT  interval Confirmed by Lacinda Axon  MD, Nickey Canedo ([id number]) on 06/19/2015 10:41:44 PM      MDM   Final diagnoses:  Essential hypertension  Epistaxis    Per nursing assessment, patient is at baseline. Will consult social worker to assess home situation. No acute interventions necessary in the emergency department.    Nat Christen, MD 06/22/15 [phone]

## 2015-06-23 DIAGNOSIS — I509 Heart failure, unspecified: Secondary | ICD-10-CM | POA: Diagnosis not present

## 2015-06-23 DIAGNOSIS — I2510 Atherosclerotic heart disease of native coronary artery without angina pectoris: Secondary | ICD-10-CM | POA: Diagnosis not present

## 2015-06-23 DIAGNOSIS — L97821 Non-pressure chronic ulcer of other part of left lower leg limited to breakdown of skin: Secondary | ICD-10-CM | POA: Diagnosis not present

## 2015-06-23 DIAGNOSIS — E1140 Type 2 diabetes mellitus with diabetic neuropathy, unspecified: Secondary | ICD-10-CM | POA: Diagnosis not present

## 2015-07-03 IMAGING — CT CT HEAD W/O CM
1 series · 16 of 28 positions shown, 20 images · non-contrast
Comparison: January 14, 2013

CLINICAL DATA: Transient ischemic attack.  The patient has unequal
grips and is able to smile

CT HEAD WITHOUT CONTRAST
TECHNIQUE: Contiguous axial images were obtained from the base of
the skull through the vertex without contrast.

[Series 2: head 5.0 h30s · axial · 0.44mm/px · z∈[+1493,+1618]mm · 16 of 28 slices shown, 20 images]
[im 2/28  brain]
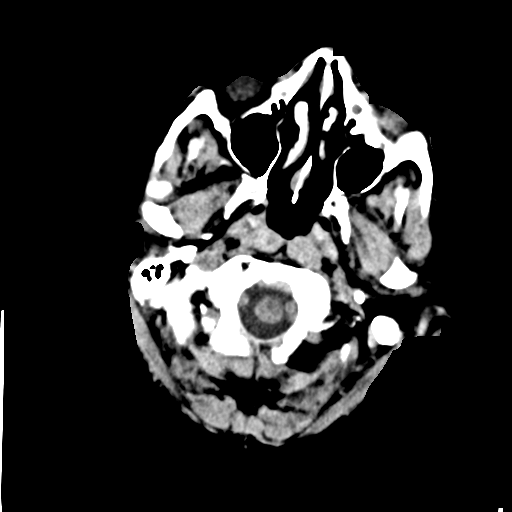
[im 2/28  bone]
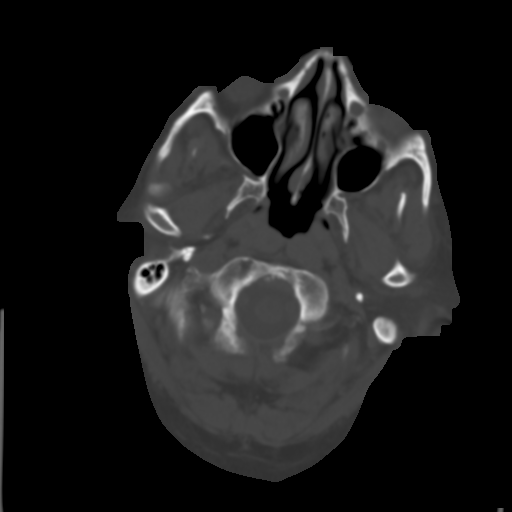
[im 4/28  brain]
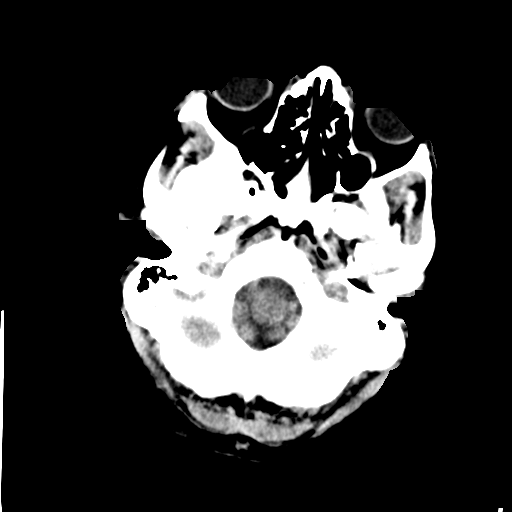
[im 6/28  brain]
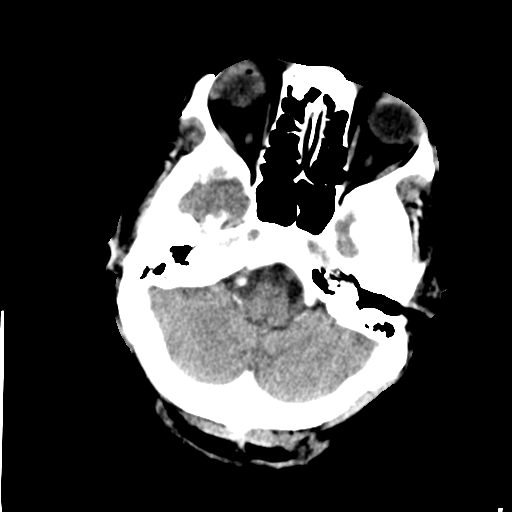
[im 7/28  brain]
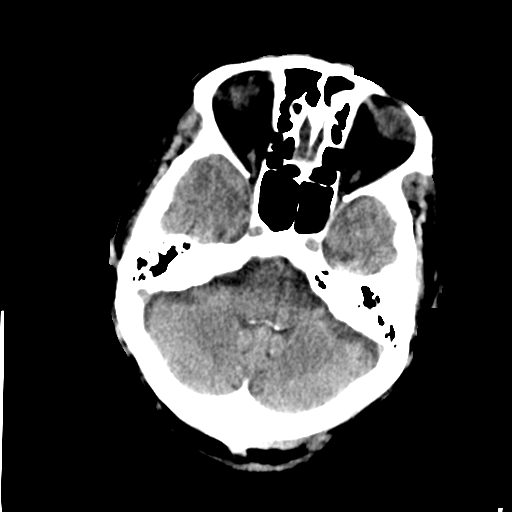
[im 9/28  brain]
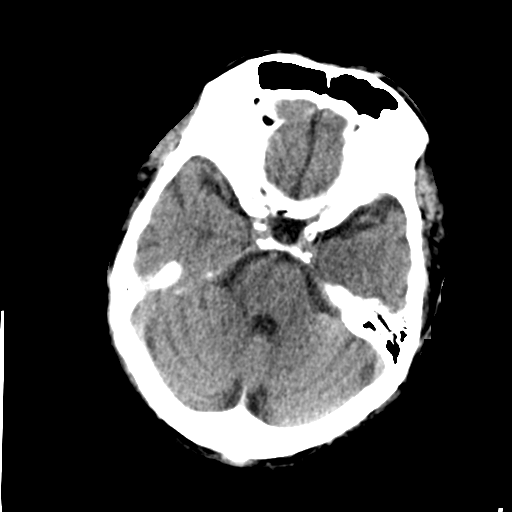
[im 9/28  bone]
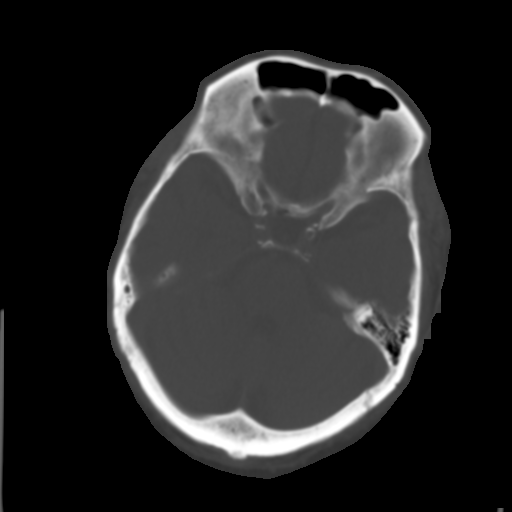
[im 10/28  brain]
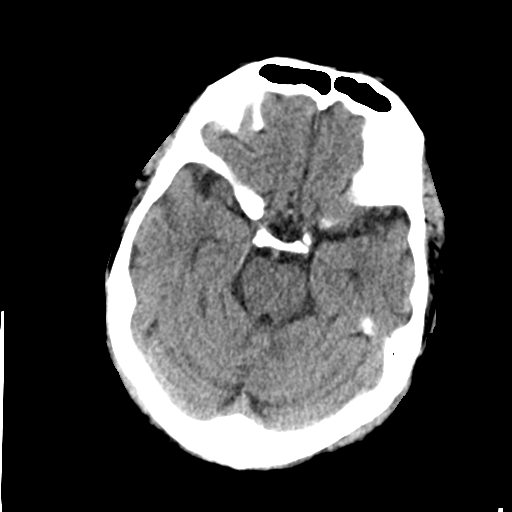
[im 12/28  brain]
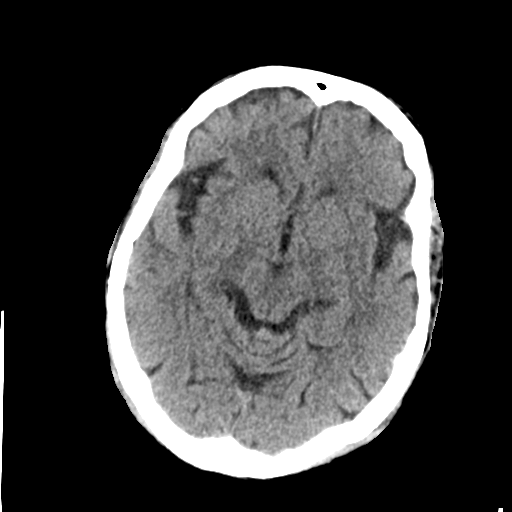
[im 14/28  brain]
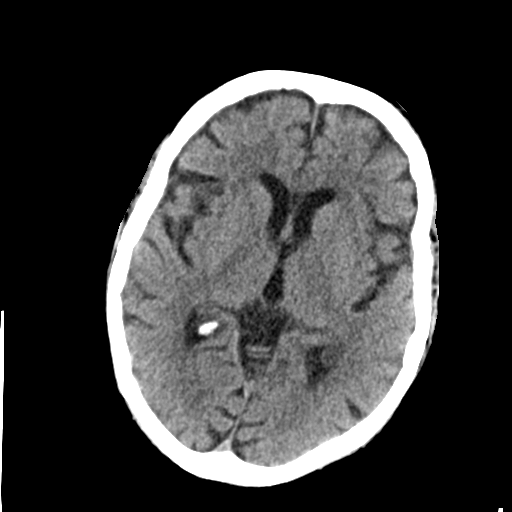
[im 15/28  brain]
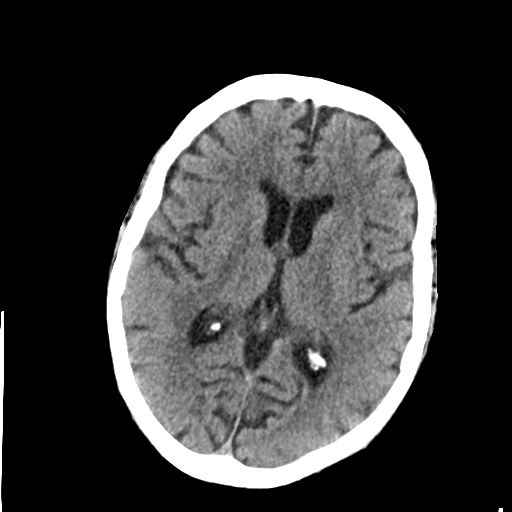
[im 15/28  bone]
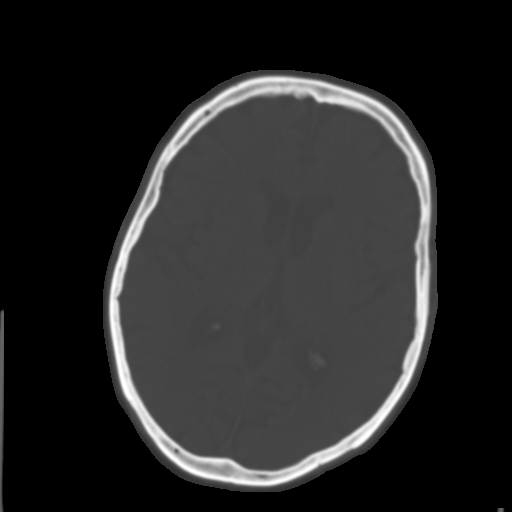
[im 17/28  brain]
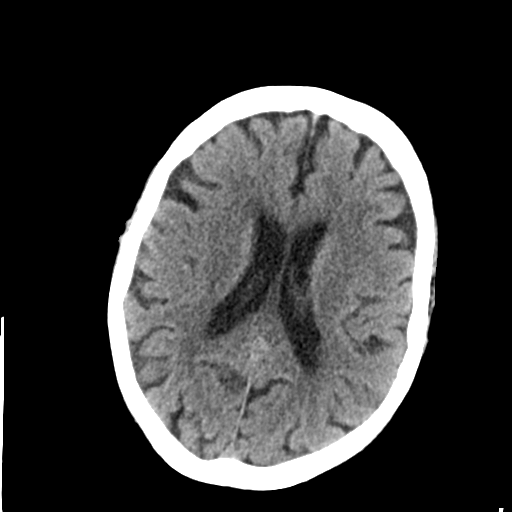
[im 19/28  brain]
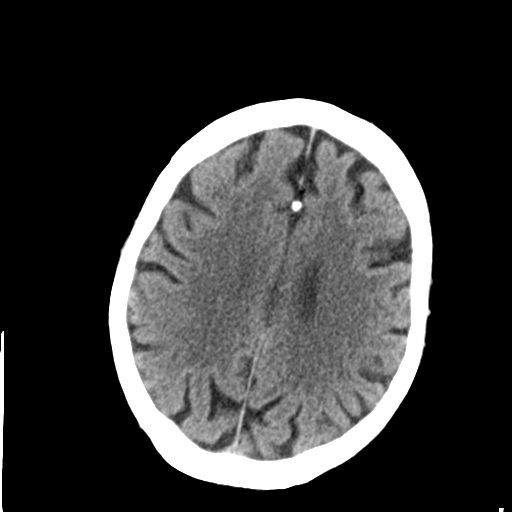
[im 20/28  brain]
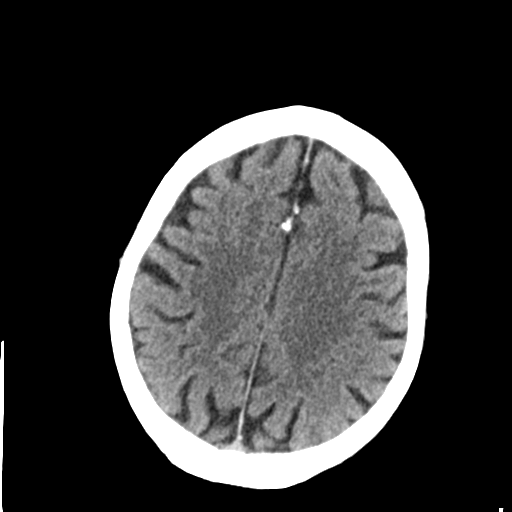
[im 22/28  brain]
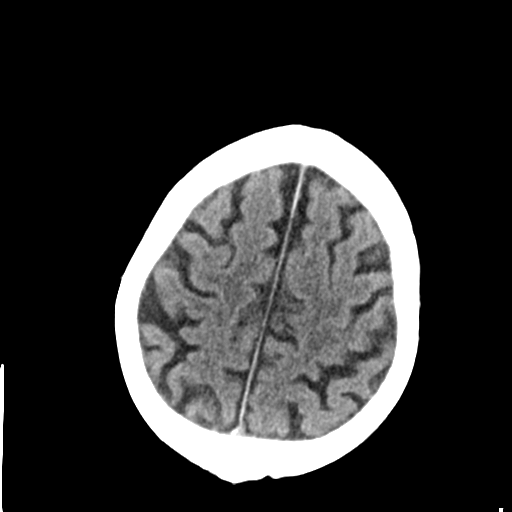
[im 22/28  bone]
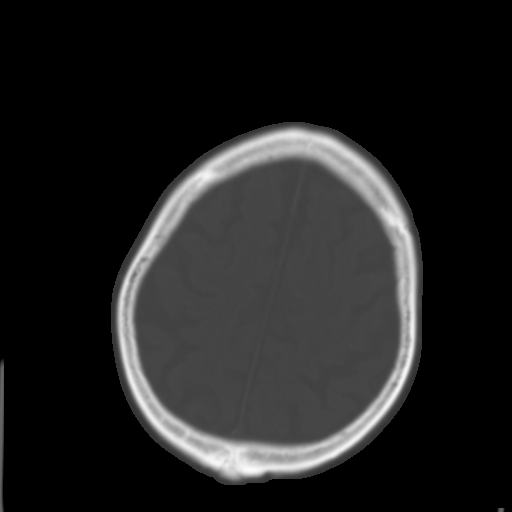
[im 23/28  brain]
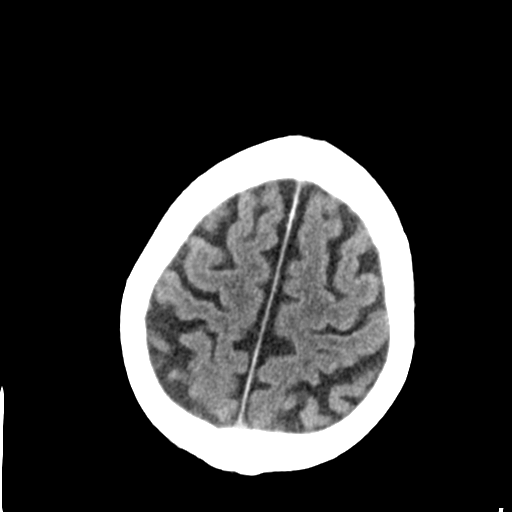
[im 25/28  brain]
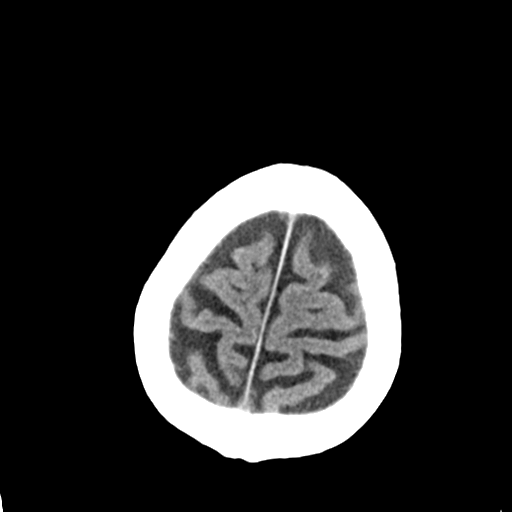
[im 27/28  brain]
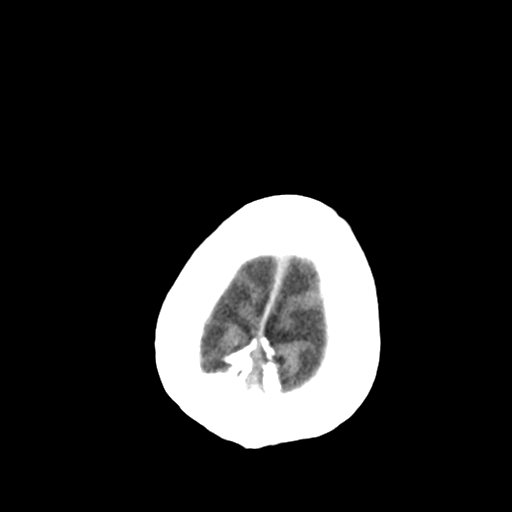

[16 of 28 positions shown; findings below may reference images not displayed]

FINDINGS: There is no midline shift, hydrocephalus, or mass effect.
There is no acute hemorrhage or acute transcortical infarct.  There
is chronic diffuse atrophy.  Chronic bilateral periventricular
white matter small vessel ischemic changes identified.  The bony
calvarium is intact.  There is a small retention cyst in the left
frontal sinus.
IMPRESSION: No focal acute intracranial abnormality identified.  Chronic
diffuse atrophy.  Chronic bilateral periventricular white matter
small vessel ischemic change.

## 2015-07-12 ENCOUNTER — Ambulatory Visit: Payer: Medicare Other | Admitting: Podiatry

## 2015-07-15 ENCOUNTER — Other Ambulatory Visit: Payer: Self-pay | Admitting: Nurse Practitioner

## 2015-07-18 ENCOUNTER — Ambulatory Visit (INDEPENDENT_AMBULATORY_CARE_PROVIDER_SITE_OTHER): Payer: Medicare Other | Admitting: Podiatry

## 2015-07-18 ENCOUNTER — Encounter: Payer: Self-pay | Admitting: Podiatry

## 2015-07-18 DIAGNOSIS — B351 Tinea unguium: Secondary | ICD-10-CM | POA: Diagnosis not present

## 2015-07-18 DIAGNOSIS — M79676 Pain in unspecified toe(s): Secondary | ICD-10-CM | POA: Diagnosis not present

## 2015-07-18 NOTE — Progress Notes (Signed)
Patient ID: [name], male   DOB: 25-Jul-1934, 81 y.o.   MRN: [id number]  Subjective: This patient presents complaining of painful toenails walking wearing shoes and requests toenail debridement  Objective: No open skin lesions bilaterally The toenails are elongated, discolored, hypertrophic, deformed and tender direct palpation 6-10  Assessment: Symptomatic onychomycosis 6-10 Type II diabetic  Plan: Debrided toenails 10 mechanically and electrically without any bleeding  Reappoint 3 months

## 2015-07-18 NOTE — Patient Instructions (Signed)
Diabetes and Foot Care Diabetes may cause you to have problems because of poor blood supply (circulation) to your feet and legs. This may cause the skin on your feet to become thinner, break easier, and heal more slowly. Your skin may become dry, and the skin may peel and crack. You may also have nerve damage in your legs and feet causing decreased feeling in them. You may not notice minor injuries to your feet that could lead to infections or more serious problems. Taking care of your feet is one of the most important things you can do for yourself.  HOME CARE INSTRUCTIONS  Wear shoes at all times, even in the house. Do not go barefoot. Bare feet are easily injured.  Check your feet daily for blisters, cuts, and redness. If you cannot see the bottom of your feet, use a mirror or ask someone for help.  Wash your feet with warm water (do not use hot water) and mild soap. Then pat your feet and the areas between your toes until they are completely dry. Do not soak your feet as this can dry your skin.  Apply a moisturizing lotion or petroleum jelly (that does not contain alcohol and is unscented) to the skin on your feet and to dry, brittle toenails. Do not apply lotion between your toes.  Trim your toenails straight across. Do not dig under them or around the cuticle. File the edges of your nails with an emery board or nail file.  Do not cut corns or calluses or try to remove them with medicine.  Wear clean socks or stockings every day. Make sure they are not too tight. Do not wear knee-high stockings since they may decrease blood flow to your legs.  Wear shoes that fit properly and have enough cushioning. To break in new shoes, wear them for just a few hours a day. This prevents you from injuring your feet. Always look in your shoes before you put them on to be sure there are no objects inside.  Do not cross your legs. This may decrease the blood flow to your feet.  If you find a minor scrape,  cut, or break in the skin on your feet, keep it and the skin around it clean and dry. These areas may be cleansed with mild soap and water. Do not cleanse the area with peroxide, alcohol, or iodine.  When you remove an adhesive bandage, be sure not to damage the skin around it.  If you have a wound, look at it several times a day to make sure it is healing.  Do not use heating pads or hot water bottles. They may burn your skin. If you have lost feeling in your feet or legs, you may not know it is happening until it is too late.  Make sure your health care provider performs a complete foot exam at least annually or more often if you have foot problems. Report any cuts, sores, or bruises to your health care provider immediately. SEEK MEDICAL CARE IF:   You have an injury that is not healing.  You have cuts or breaks in the skin.  You have an ingrown nail.  You notice redness on your legs or feet.  You feel burning or tingling in your legs or feet.  You have pain or cramps in your legs and feet.  Your legs or feet are numb.  Your feet always feel cold. SEEK IMMEDIATE MEDICAL CARE IF:   There is increasing redness,   swelling, or pain in or around a wound.  There is a red line that goes up your leg.  Pus is coming from a wound.  You develop a fever or as directed by your health care provider.  You notice a bad smell coming from an ulcer or wound.   This information is not intended to replace advice given to you by your health care provider. Make sure you discuss any questions you have with your health care provider.   Document Released: 08/09/2000 Document Revised: 04/14/2013 Document Reviewed: 01/19/2013 Elsevier Interactive Patient Education 2016 [employer]  

## 2015-07-25 ENCOUNTER — Ambulatory Visit: Payer: Medicare Other | Admitting: Podiatry

## 2015-07-31 ENCOUNTER — Encounter (HOSPITAL_COMMUNITY): Payer: Self-pay | Admitting: Emergency Medicine

## 2015-07-31 ENCOUNTER — Emergency Department (HOSPITAL_COMMUNITY): Payer: Medicare Other

## 2015-07-31 ENCOUNTER — Inpatient Hospital Stay (HOSPITAL_COMMUNITY)
Admission: EM | Admit: 2015-07-31 | Discharge: 2015-08-05 | DRG: 603 | Disposition: A | Discharge status: Skilled Nursing Facility | Payer: Medicare Other | Attending: Internal Medicine | Admitting: Internal Medicine

## 2015-07-31 DIAGNOSIS — N183 Chronic kidney disease, stage 3 unspecified: Secondary | ICD-10-CM | POA: Diagnosis present

## 2015-07-31 DIAGNOSIS — Z794 Long term (current) use of insulin: Secondary | ICD-10-CM

## 2015-07-31 DIAGNOSIS — M7989 Other specified soft tissue disorders: Secondary | ICD-10-CM

## 2015-07-31 DIAGNOSIS — I130 Hypertensive heart and chronic kidney disease with heart failure and stage 1 through stage 4 chronic kidney disease, or unspecified chronic kidney disease: Secondary | ICD-10-CM | POA: Diagnosis present

## 2015-07-31 DIAGNOSIS — I2510 Atherosclerotic heart disease of native coronary artery without angina pectoris: Secondary | ICD-10-CM | POA: Diagnosis present

## 2015-07-31 DIAGNOSIS — L03116 Cellulitis of left lower limb: Principal | ICD-10-CM | POA: Diagnosis present

## 2015-07-31 DIAGNOSIS — E1121 Type 2 diabetes mellitus with diabetic nephropathy: Secondary | ICD-10-CM | POA: Diagnosis present

## 2015-07-31 DIAGNOSIS — Z7982 Long term (current) use of aspirin: Secondary | ICD-10-CM | POA: Diagnosis not present

## 2015-07-31 DIAGNOSIS — I890 Lymphedema, not elsewhere classified: Secondary | ICD-10-CM

## 2015-07-31 DIAGNOSIS — Z9119 Patient's noncompliance with other medical treatment and regimen: Secondary | ICD-10-CM | POA: Diagnosis not present

## 2015-07-31 DIAGNOSIS — Z6833 Body mass index (BMI) 33.0-33.9, adult: Secondary | ICD-10-CM | POA: Diagnosis not present

## 2015-07-31 DIAGNOSIS — Z7984 Long term (current) use of oral hypoglycemic drugs: Secondary | ICD-10-CM | POA: Diagnosis not present

## 2015-07-31 DIAGNOSIS — Z87891 Personal history of nicotine dependence: Secondary | ICD-10-CM

## 2015-07-31 DIAGNOSIS — I10 Essential (primary) hypertension: Secondary | ICD-10-CM | POA: Diagnosis present

## 2015-07-31 DIAGNOSIS — Z833 Family history of diabetes mellitus: Secondary | ICD-10-CM | POA: Diagnosis not present

## 2015-07-31 DIAGNOSIS — I878 Other specified disorders of veins: Secondary | ICD-10-CM | POA: Diagnosis present

## 2015-07-31 DIAGNOSIS — D696 Thrombocytopenia, unspecified: Secondary | ICD-10-CM | POA: Diagnosis present

## 2015-07-31 DIAGNOSIS — N179 Acute kidney failure, unspecified: Secondary | ICD-10-CM | POA: Diagnosis present

## 2015-07-31 DIAGNOSIS — I4891 Unspecified atrial fibrillation: Secondary | ICD-10-CM | POA: Diagnosis present

## 2015-07-31 DIAGNOSIS — Z9049 Acquired absence of other specified parts of digestive tract: Secondary | ICD-10-CM

## 2015-07-31 DIAGNOSIS — E1122 Type 2 diabetes mellitus with diabetic chronic kidney disease: Secondary | ICD-10-CM | POA: Diagnosis present

## 2015-07-31 DIAGNOSIS — Z9114 Patient's other noncompliance with medication regimen: Secondary | ICD-10-CM

## 2015-07-31 DIAGNOSIS — I5032 Chronic diastolic (congestive) heart failure: Secondary | ICD-10-CM | POA: Diagnosis present

## 2015-07-31 DIAGNOSIS — D631 Anemia in chronic kidney disease: Secondary | ICD-10-CM | POA: Diagnosis present

## 2015-07-31 DIAGNOSIS — Z808 Family history of malignant neoplasm of other organs or systems: Secondary | ICD-10-CM

## 2015-07-31 DIAGNOSIS — Z79899 Other long term (current) drug therapy: Secondary | ICD-10-CM

## 2015-07-31 DIAGNOSIS — E669 Obesity, unspecified: Secondary | ICD-10-CM | POA: Diagnosis present

## 2015-07-31 DIAGNOSIS — IMO0002 Reserved for concepts with insufficient information to code with codable children: Secondary | ICD-10-CM

## 2015-07-31 DIAGNOSIS — M1990 Unspecified osteoarthritis, unspecified site: Secondary | ICD-10-CM | POA: Diagnosis present

## 2015-07-31 DIAGNOSIS — E1165 Type 2 diabetes mellitus with hyperglycemia: Secondary | ICD-10-CM | POA: Diagnosis present

## 2015-07-31 HISTORY — DX: Atherosclerotic heart disease of native coronary artery without angina pectoris: I25.10

## 2015-07-31 LAB — BASIC METABOLIC PANEL
ANION GAP: 7 (ref 5–15)
ANION GAP: 5 (ref 5–15)
BUN: 29 mg/dL — ABNORMAL HIGH (ref 6–20)
BUN: 30 mg/dL — ABNORMAL HIGH (ref 6–20)
CALCIUM: 8.7 mg/dL — AB (ref 8.9–10.3)
CHLORIDE: 104 mmol/L (ref 101–111)
CO2: 28 mmol/L (ref 22–32)
CO2: 29 mmol/L (ref 22–32)
CREATININE: 1.9 mg/dL — AB (ref 0.61–1.24)
Calcium: 8.6 mg/dL — ABNORMAL LOW (ref 8.9–10.3)
Chloride: 107 mmol/L (ref 101–111)
Creatinine, Ser: 2.03 mg/dL — ABNORMAL HIGH (ref 0.61–1.24)
GFR calc non Af Amer: 29 mL/min — ABNORMAL LOW (ref >60)
GFR, EST AFRICAN AMERICAN: 34 mL/min — AB (ref >60)
GFR, EST AFRICAN AMERICAN: 37 mL/min — AB (ref >60)
GFR, EST NON AFRICAN AMERICAN: 32 mL/min — AB (ref >60)
GLUCOSE: 161 mg/dL — AB (ref 65–99)
Glucose, Bld: 184 mg/dL — ABNORMAL HIGH (ref 65–99)
Potassium: 4.1 mmol/L (ref 3.5–5.1)
Potassium: 4.1 mmol/L (ref 3.5–5.1)
SODIUM: 141 mmol/L (ref 135–145)
Sodium: 139 mmol/L (ref 135–145)

## 2015-07-31 LAB — CBC WITH DIFFERENTIAL/PLATELET
BASOS ABS: 0 10*3/uL (ref 0.0–0.1)
BASOS PCT: 0 %
Basophils Absolute: 0 10*3/uL (ref 0.0–0.1)
Basophils Relative: 0 %
EOS ABS: 0.1 10*3/uL (ref 0.0–0.7)
Eosinophils Absolute: 0 10*3/uL (ref 0.0–0.7)
Eosinophils Relative: 1 %
Eosinophils Relative: 1 %
HCT: 34 % — ABNORMAL LOW (ref 39.0–52.0)
HCT: 33.3 % — ABNORMAL LOW (ref 39.0–52.0)
HEMOGLOBIN: 10.7 g/dL — AB (ref 13.0–17.0)
Hemoglobin: 10.3 g/dL — ABNORMAL LOW (ref 13.0–17.0)
LYMPHS ABS: 1.6 10*3/uL (ref 0.7–4.0)
LYMPHS PCT: 25 %
Lymphocytes Relative: 23 %
Lymphs Abs: 1.7 10*3/uL (ref 0.7–4.0)
MCH: 27.6 pg (ref 26.0–34.0)
MCH: 27.2 pg (ref 26.0–34.0)
MCHC: 31.5 g/dL (ref 30.0–36.0)
MCHC: 30.9 g/dL (ref 30.0–36.0)
MCV: 87.6 fL (ref 78.0–100.0)
MCV: 88.1 fL (ref 78.0–100.0)
MONO ABS: 1.4 10*3/uL — AB (ref 0.1–1.0)
MONO ABS: 1.3 10*3/uL — AB (ref 0.1–1.0)
MONOS PCT: 21 %
Monocytes Relative: 18 %
NEUTROS PCT: 53 %
NEUTROS PCT: 58 %
Neutro Abs: 3.5 10*3/uL (ref 1.7–7.7)
Neutro Abs: 4.1 10*3/uL (ref 1.7–7.7)
PLATELETS: 104 10*3/uL — AB (ref 150–400)
Platelets: 115 10*3/uL — ABNORMAL LOW (ref 150–400)
RBC: 3.88 MIL/uL — ABNORMAL LOW (ref 4.22–5.81)
RBC: 3.78 MIL/uL — ABNORMAL LOW (ref 4.22–5.81)
RDW: 15 % (ref 11.5–15.5)
RDW: 15.1 % (ref 11.5–15.5)
WBC: 6.5 10*3/uL (ref 4.0–10.5)
WBC: 7.2 10*3/uL (ref 4.0–10.5)

## 2015-07-31 LAB — SEDIMENTATION RATE: SED RATE: 45 mm/h — AB (ref 0–16)

## 2015-07-31 LAB — GLUCOSE, CAPILLARY
GLUCOSE-CAPILLARY: 158 mg/dL — AB (ref 65–99)
Glucose-Capillary: 157 mg/dL — ABNORMAL HIGH (ref 65–99)

## 2015-07-31 LAB — PREALBUMIN: Prealbumin: 19.6 mg/dL (ref 18–38)

## 2015-07-31 LAB — C-REACTIVE PROTEIN: CRP: 1.2 mg/dL — ABNORMAL HIGH (ref <1.0)

## 2015-07-31 MED ORDER — HYDRALAZINE HCL 25 MG PO TABS
25.0000 mg | ORAL_TABLET | Freq: Four times a day (QID) | ORAL | Status: DC
Start: 2015-07-31 — End: 2015-08-05
  Administered 2015-07-31 – 2015-08-05 (×16): 25 mg via ORAL
  Filled 2015-07-31 (×23): qty 1

## 2015-07-31 MED ORDER — ACETAMINOPHEN 650 MG RE SUPP
650.0000 mg | Freq: Four times a day (QID) | RECTAL | Status: DC | PRN
Start: 2015-07-31 — End: 2015-08-05

## 2015-07-31 MED ORDER — CARVEDILOL 12.5 MG PO TABS
12.5000 mg | ORAL_TABLET | Freq: Two times a day (BID) | ORAL | Status: DC | PRN
  Filled 2015-07-31: qty 1

## 2015-07-31 MED ORDER — ALBUTEROL SULFATE (2.5 MG/3ML) 0.083% IN NEBU
2.5000 mg | INHALATION_SOLUTION | Freq: Four times a day (QID) | RESPIRATORY_TRACT | Status: DC | PRN
  Filled 2015-07-31: qty 0.5

## 2015-07-31 MED ORDER — CARVEDILOL 12.5 MG PO TABS: 12.5000 mg | ORAL_TABLET | Freq: Two times a day (BID) | ORAL | Status: DC | PRN

## 2015-07-31 MED ORDER — ONDANSETRON HCL 4 MG/2ML IJ SOLN: 4.0000 mg | Freq: Four times a day (QID) | INTRAMUSCULAR | Status: DC | PRN

## 2015-07-31 MED ORDER — INSULIN ASPART 100 UNIT/ML ~~LOC~~ SOLN: 0.0000 [IU] | Freq: Three times a day (TID) | SUBCUTANEOUS | Status: DC

## 2015-07-31 MED ORDER — SODIUM CHLORIDE 0.9 % IV SOLN
2000.0000 mg | Freq: Once | INTRAVENOUS | Status: DC
  Filled 2015-07-31: qty 2000

## 2015-07-31 MED ORDER — ENOXAPARIN SODIUM 30 MG/0.3ML ~~LOC~~ SOLN: 30.0000 mg | SUBCUTANEOUS | Status: DC

## 2015-07-31 MED ORDER — ACETAMINOPHEN 325 MG PO TABS: 650.0000 mg | ORAL_TABLET | Freq: Four times a day (QID) | ORAL | Status: DC | PRN

## 2015-07-31 MED ORDER — ISOSORB DINITRATE-HYDRALAZINE 20-37.5 MG PO TABS
1.0000 | ORAL_TABLET | Freq: Three times a day (TID) | ORAL | Status: DC
  Administered 2015-07-31 – 2015-08-05 (×12): 1 via ORAL
  Filled 2015-07-31 (×17): qty 1

## 2015-07-31 MED ORDER — POLYETHYLENE GLYCOL 3350 17 G PO PACK
17.0000 g | PACK | Freq: Every day | ORAL | Status: DC
  Administered 2015-08-05: 17 g via ORAL
  Filled 2015-07-31 (×7): qty 1

## 2015-07-31 MED ORDER — INSULIN GLARGINE 100 UNIT/ML ~~LOC~~ SOLN
10.0000 [IU] | Freq: Every day | SUBCUTANEOUS | Status: DC
  Administered 2015-07-31 – 2015-08-04 (×5): 10 [IU] via SUBCUTANEOUS
  Filled 2015-07-31 (×6): qty 0.1

## 2015-07-31 MED ORDER — SODIUM CHLORIDE 0.9 % IJ SOLN
3.0000 mL | Freq: Two times a day (BID) | INTRAMUSCULAR | Status: DC
  Administered 2015-08-02: 3 mL via INTRAVENOUS

## 2015-07-31 MED ORDER — FERROUS SULFATE 325 (65 FE) MG PO TABS
325.0000 mg | ORAL_TABLET | Freq: Two times a day (BID) | ORAL | Status: DC
  Administered 2015-07-31 – 2015-08-05 (×7): 325 mg via ORAL
  Filled 2015-07-31 (×11): qty 1

## 2015-07-31 MED ORDER — GLIMEPIRIDE 2 MG PO TABS
2.0000 mg | ORAL_TABLET | Freq: Every day | ORAL | Status: DC
  Filled 2015-07-31: qty 1

## 2015-07-31 MED ORDER — HYDROCODONE-ACETAMINOPHEN 5-325 MG PO TABS: 1.0000 | ORAL_TABLET | ORAL | Status: DC | PRN

## 2015-07-31 MED ORDER — ONDANSETRON HCL 4 MG PO TABS
4.0000 mg | ORAL_TABLET | Freq: Four times a day (QID) | ORAL | Status: DC | PRN
  Filled 2015-07-31: qty 1

## 2015-07-31 MED ORDER — INSULIN ASPART 100 UNIT/ML ~~LOC~~ SOLN
0.0000 [IU] | Freq: Every day | SUBCUTANEOUS | Status: DC
  Administered 2015-08-03: 2 [IU] via SUBCUTANEOUS

## 2015-07-31 MED ORDER — HYDRALAZINE HCL 20 MG/ML IJ SOLN
5.0000 mg | Freq: Four times a day (QID) | INTRAMUSCULAR | Status: DC | PRN
  Administered 2015-07-31 – 2015-08-04 (×2): 5 mg via INTRAVENOUS
  Filled 2015-07-31 (×2): qty 1

## 2015-07-31 MED ORDER — INSULIN ASPART 100 UNIT/ML ~~LOC~~ SOLN
0.0000 [IU] | Freq: Three times a day (TID) | SUBCUTANEOUS | Status: DC
  Administered 2015-07-31: 4 [IU] via SUBCUTANEOUS
  Administered 2015-08-01: 3 [IU] via SUBCUTANEOUS
  Administered 2015-08-01: 4 [IU] via SUBCUTANEOUS
  Administered 2015-08-01 – 2015-08-03 (×7): 3 [IU] via SUBCUTANEOUS
  Administered 2015-08-04: 4 [IU] via SUBCUTANEOUS
  Administered 2015-08-04 – 2015-08-05 (×3): 3 [IU] via SUBCUTANEOUS

## 2015-07-31 MED ORDER — VANCOMYCIN HCL IN DEXTROSE 1-5 GM/200ML-% IV SOLN: 1000.0000 mg | INTRAVENOUS | Status: DC

## 2015-07-31 MED ORDER — CLINDAMYCIN PHOSPHATE 600 MG/50ML IV SOLN
600.0000 mg | Freq: Three times a day (TID) | INTRAVENOUS | Status: DC
  Administered 2015-07-31 – 2015-08-01 (×3): 600 mg via INTRAVENOUS
  Filled 2015-07-31 (×4): qty 50

## 2015-07-31 MED ORDER — SODIUM CHLORIDE 0.9 % IV SOLN
INTRAVENOUS | Status: AC
Start: 2015-07-31 — End: 2015-07-31
  Administered 2015-07-31: 14:00:00 via INTRAVENOUS

## 2015-07-31 MED ORDER — CARVEDILOL 12.5 MG PO TABS
12.5000 mg | ORAL_TABLET | Freq: Two times a day (BID) | ORAL | Status: DC
  Administered 2015-07-31 – 2015-08-05 (×8): 12.5 mg via ORAL
  Filled 2015-07-31 (×4): qty 1
  Filled 2015-07-31: qty 2
  Filled 2015-07-31 (×4): qty 1

## 2015-07-31 MED ORDER — BISACODYL 10 MG RE SUPP: 10.0000 mg | RECTAL | Status: DC | PRN

## 2015-07-31 MED ORDER — ASPIRIN EC 81 MG PO TBEC
81.0000 mg | DELAYED_RELEASE_TABLET | Freq: Every day | ORAL | Status: DC
  Administered 2015-07-31 – 2015-08-05 (×5): 81 mg via ORAL
  Filled 2015-07-31 (×6): qty 1

## 2015-07-31 NOTE — Consult Note (Signed)
WOC wound consult note Reason for Consult:Bilateral LE edema, left LE with linear break in the skin Wound type:Venous insufficency Pressure Ulcer POA: No Measurement: 4cm x 15cm affected area of deepened discoloration (maroon/red) with a 2cm x 1cm x 0.2cm open area Wound bed:red, moist Drainage (amount, consistency, odor) scant serous.  (Exudate had dried and clothing had become adherent upon admission.  Garments had to be cut away from tissue in ED.) Periwound: Dry, scaly, peeling Dressing procedure/placement/frequency: Patient reports that "years ago" he was placed in [employer] for a similar problem, but that he had to discontinue treatment as he returned to work and the [facility] would only come if he "stayed home". He said that his legs have been swollen since that time, but never broken open. I do not think that initiation of a compression boot is appropriate at this time in the presence of erythema and warmth. I will instead provide Nursing with guidance on topical wound care for the LLE topped with a "dry boot" application (Kerlix topped with ACE bandage). This will be changed twice daily so that we can monitor his response to antibiotics.  If you agree, consider referral to outpatient wound care center or [facility] for continuing care. [facility] nursing team will not follow, but will remain available to this patient, the nursing and medical teams.  Please re-consult if needed. Thanks, Maudie Flakes, MSN, RN, [place], Arther Abbott  Pager# [phone]

## 2015-07-31 NOTE — ED Notes (Signed)
PTAR also mentioned pt is living in a hotel, and wanted to see if social work could speak with pt.

## 2015-07-31 NOTE — Progress Notes (Signed)
PATIENT LIVES AT THE [facility] ON [address].   PATIENT CONCERNED ABOUT HIS BELONGINGS.  MADE AN ATTEMPT TO CALL DAUGHTER-NUMBER IS NOT IN SERVICE.  PATIENT ALSO HAS HIS MEDS AND THEY NEED TO BE TAKEN TO THE PHARMACY

## 2015-07-31 NOTE — ED Notes (Signed)
Pt refused BP medications. Explained risks to pt, but pt still refused.

## 2015-07-31 NOTE — ED Notes (Signed)
Hospitalist at bedside 

## 2015-07-31 NOTE — ED Notes (Signed)
Pt can go to floor 1400. [name]

## 2015-07-31 NOTE — ED Notes (Addendum)
Pt from home. Reports L anterior calf wound for over a month. Same wound was treated at the wound center a month ago. Wound was oozing this weekend, so pt decided to be seen today. Pt also had an elevated BP of 230/120 with EMS. Pt is supposed to be taking HTN medications, but does not take them because he does not like the way they make him feel. Denies blurred vision or HA. Wound drainage stuck to pant leg and sock. Put wet washcloths on top to soak off clothing.

## 2015-07-31 NOTE — ED Notes (Signed)
Patient transported to X-ray 

## 2015-07-31 NOTE — Progress Notes (Addendum)
ANTIBIOTIC CONSULT NOTE - INITIAL  Pharmacy Consult for Vancomycin Indication: cellulitis  No Known Allergies  Patient Measurements: Height: 70 inches TBW:   Vital Signs: Temp: 98.4 F (36.9 C) (12/05 0856) Temp Source: Oral (12/05 0856) BP: 215/82 mmHg (12/05 1059) Pulse Rate: 59 (12/05 1059) Intake/Output from previous day:   Intake/Output from this shift:    Labs:  Recent Labs  07/31/15 1000  WBC 6.5  HGB 10.7*  PLT 115*  CREATININE 2.03*   CrCl cannot be calculated (Unknown ideal weight.). No results for input(s): VANCOTROUGH, VANCOPEAK, VANCORANDOM, GENTTROUGH, GENTPEAK, GENTRANDOM, TOBRATROUGH, TOBRAPEAK, TOBRARND, AMIKACINPEAK, AMIKACINTROU, AMIKACIN in the last 72 hours.   Microbiology: No results found for this or any previous visit (from the past 720 hour(s)).  Medical History: Past Medical History  Diagnosis Date  . Smoker   . Narcolepsy   . Hypertension   . BPH (benign prostatic hyperplasia)   . Arthritis   . Diabetes mellitus   . Neuropathy in diabetes ([place])   . Herpes   . Dyslipidemia   . Obesity   . Lymphedema     LE lymphedemia with hx of cellulitis.  . CKD (chronic kidney disease)   . Anemia   . History of GI bleed 06/2012    EGD unremarkable;  Colo with large cecal polyp (bx benign)  . Ischemic cardiomyopathy     Echo 06/19/12: EF 40-45%, mid to distal anteroseptal HK, moderate LAE, PASP 37  . Chronic systolic heart failure ([place])   . CAD (coronary artery disease)     a. probable underlying CAD;  b. Type 2 NSTEMi in setting of profound anemia (Hgb 3.6) from GI bleed in 06/2012 and WMA on echo;  c. med Rx due to CKD and GI bleed  . CHF (congestive heart failure) ([place])   . Atrial fibrillation (HCC)     Medications:  Scheduled:  Infusions:  PRN:  Assessment: 81yoM with history of diabetes, CKD, CHF, CAD and Afib who presents to the ED with left leg wound that is oozing and worsening over the past week. Pt reports that he has not  been seen in wound care. Vancomycin is to be started for cellulitis.  Goal of Therapy:  Vancomycin trough level 10-15 mcg/ml  Plan:  Will give Vancomycin 2gm IV x 1 then 1000 mg Q24h Will check Vanco trough level at steady state Will f/u Scr and Cx   Garnet Sierras 07/31/2015,12:57 PM

## 2015-07-31 NOTE — H&P (Signed)
[facility] History and Physical  [name] [id number] DOB: [birth year] DOA: 07/31/2015  Referring physician: ER PA: Sula Rumple PCP: Philis Fendt, MD  Chief Complaint: Left lower extremity swelling, pain and redness  HPI:  81 -year-old male with past medical history of insulin-dependent diabetes, chronic diastolic CHF (last 2-D echo in April 2016 with grade 2 diastolic dysfunction and preserved ejection fraction), hypertension, lower extremity edema who presented to [facility] ED with reports of worsening redness, pain and swelling in the left lower extremity. He reports having chronic swelling in legs but definitely has gotten worse in past few days. Patient takes Lasix on an as needed basis for lower extremity swelling. He has not been compliant with medications as prescribed. Patient reports no difficulty ambulating. Because of obesity his ambulation however is limited. Patient reports no fevers or chills. Patient reports no abdominal pain, nausea or vomiting. No reports of chest pain or palpitations. No reports of numbness in lower extremities.  In ED, blood pressure was 230/80, heart rate 62. Patient was afebrile. Blood work demonstrated hemoglobin of 10.7, platelets 115, creatinine 2.03. X-ray of the left tibia, fibula did not show acute osseous findings other than diffuse left leg swelling without soft tissue gas. Per cellulitis order set patient was started on empiric clindamycin. Wound care consulted as well.  y.o. male with history of diabetes, arthritis, chronic kidney disease, CHF, CAD, and a-fib who presents to the ED from an extended stay hotel complaining of a wound to his left lower extremity that has worsened over this past week. He reports the wound was oozing since this weekend and he was unable to remove his pants as they were stuck to his leg. He also reports he has not been taking any of his medications, including his blood pressure and insulin. The patient  reports he does not like the way his blood pressure medicines make him feel. Patient reports he's been seen in wound care previously but not recently. The patient has taken nothing for treatment today. The patient denies fevers, recent illness, chest pain, shortness of breath, new numbness, tingling or weakness, urinary symptoms, lightheadedness, dizziness, syncope, headache, new abdominal pain, or vomiting.  Assessment & Plan    Principal Problem:   Cellulitis of left lower extremity / Chronic acquired lymphedema - Left lower extremity with more erythema compared to right lower extremity - Cellulitis order placed. Clindamycin antibiotic of choice for mild to moderate nonpurulent cellulitis. - Wound care consulted, appreciated their assessment   Active Problems:   Acute renal failure superimposed on stage 3 chronic kidney disease ([place]) - Baseline creatinine in March 2016 was 1.9. - Creatinine on this admission is above baseline values, 2.03. - Likely reflective of acute infection versus Lasix although patient did not take Lasix on consistent basis - Will give short course of fluids considering patient has chronic diastolic CHF. He is not in acute exacerbation at this time so it is safe to administer IV fluids for a few hours. - Follow-up BMP tomorrow morning     Chronic diastolic CHF (congestive heart failure) (HCC) - Stable, compensated. - Last 2-D echo in April 2016 with grade 2 diastolic dysfunction and preserved ejection fraction - Continue aspirin, carvedilol    Uncontrolled diabetes mellitus with diabetic nephropathy, with long-term current use of insulin (HCC) - Last A1c in March 2016 was 8.4 indicating uncontrolled diabetes - Resume insulin regimen per home dosing: Lantus 10 units at bedtime along with sliding scale insulin - Continue glimepiride 2 mg  daily - Diabetic coordinator consulted    Accelerated hypertension - Continue Bidil 3 times daily. - Continue carvedilol 1.5 mg  BID -  Added hydralazine 25 mg every 6 hours for better blood pressure control. - Monitor on telemetry.    Thrombocytopenia (HCC) - Mild. Platelets are 115. - Perhaps because of aspirin. No evidence of bleeding. - Using SCDs for DVT prophylaxis  DVT prophylaxis:  - SCDs bilaterally  Radiological Exams on Admission: Dg Tibia/fibula Left 07/31/2015  Diffuse leg swelling without soft tissue gas or acute osseous finding. Electronically Signed   By: Monte Fantasia M.D.   On: 07/31/2015 12:07     Code Status: Full Family Communication: Plan of care discussed with the patient  Disposition Plan: Admit for further evaluation, telemetry due to accelerated hypertension  Leisa Lenz, MD  [facility] Pager [phone]  Time spent in minutes: 75 minutes  Review of Systems:  Constitutional: Negative for fever, chills and malaise/fatigue. Negative for diaphoresis.  HENT: Negative for hearing loss, ear pain, nosebleeds, congestion, sore throat, neck pain, tinnitus and ear discharge.   Eyes: Negative for blurred vision, double vision, photophobia, pain, discharge and redness.  Respiratory: Negative for cough, hemoptysis, sputum production, shortness of breath, wheezing and stridor.   Cardiovascular: Negative for chest pain, palpitations, orthopnea, claudication and leg swelling.  Gastrointestinal: Negative for nausea, vomiting and abdominal pain. Negative for heartburn, constipation, blood in stool and melena.  Genitourinary: Negative for dysuria, urgency, frequency, hematuria and flank pain.  Musculoskeletal: Negative for myalgias, back pain, joint pain and falls.  Skin: per HPI Neurological: Negative for dizziness and weakness. Negative for tingling, tremors, sensory change, speech change, focal weakness, loss of consciousness and headaches.  Endo/Heme/Allergies: Negative for environmental allergies and polydipsia. Does not bruise/bleed easily.  Psychiatric/Behavioral: Negative for  suicidal ideas. The patient is not nervous/anxious.      History reviewed. No pertinent past medical history. Past Surgical History  Procedure Laterality Date  . Leg surgery      Left leg surgery. broken leg  . Esophagogastroduodenoscopy  06/19/2012    Procedure: ESOPHAGOGASTRODUODENOSCOPY (EGD);  Surgeon: Beryle Beams, MD;  Location: [facility] ENDOSCOPY;  Service: Endoscopy;  Laterality: N/A;  . Colonoscopy  06/21/2012    Procedure: COLONOSCOPY;  Surgeon: Juanita Craver, MD;  Location: [facility] ENDOSCOPY;  Service: Endoscopy;  Laterality: N/A;  . Colonoscopy Left 02/13/2014    Procedure: COLONOSCOPY;  Surgeon: Juanita Craver, MD;  Location: [place];  Service: Endoscopy;  Laterality: Left;  . Laparoscopic right colectomy Right 02/15/2014    Procedure: LAPAROSCOPIC ASISSTED RIGHT  COLECTOMY, POSSIBLE OPEN;  Surgeon: Rolm Bookbinder, MD;  Location: [place];  Service: General;  Laterality: Right;   Social History:  reports that he quit smoking about 16 years ago. He has never used smokeless tobacco. He reports that he does not drink alcohol or use illicit drugs.  No Known Allergies  Family History:  Family History  Problem Relation Age of Onset  . Brain cancer Mother   . Diabetes type II Sister   . Narcolepsy Paternal Uncle      Prior to Admission medications   Medication Sig Start Date End Date Taking? Authorizing Provider  aspirin 81 MG tablet Take 1 tablet (81 mg total) by mouth daily. 11/29/14  Yes Nishant Dhungel, MD  bisacodyl (DULCOLAX) 10 MG suppository Place 1 suppository (10 mg total) rectally as needed for moderate constipation. 05/18/15  Yes Varney Biles, MD  carvedilol (COREG) 12.5 MG tablet Take 12.5 mg by mouth 2 (  two) times daily as needed (For heart.).    Yes Historical Provider, MD  ferrous sulfate 325 (65 FE) MG tablet Take 1 tablet (325 mg total) by mouth 2 (two) times daily with a meal. 11/29/14  Yes Nishant Dhungel, MD  furosemide (LASIX) 40 MG tablet Take 1 tablet (40 mg total)  by mouth daily. Patient taking differently: Take 40 mg by mouth daily as needed (For swelling.).  01/10/14  Yes Denita Lung, MD  glimepiride (AMARYL) 2 MG tablet Take 1 tablet (2 mg total) by mouth daily with breakfast. 11/29/14  Yes Nishant Dhungel, MD  insulin aspart (NOVOLOG) 100 UNIT/ML injection Inject 0-15 Units into the skin 3 (three) times daily with meals. 11/29/14  Yes Nishant Dhungel, MD  insulin glargine (LANTUS) 100 UNIT/ML injection Inject 0.1 mLs (10 Units total) into the skin at bedtime. 11/29/14  Yes Nishant Dhungel, MD  isosorbide-hydrALAZINE (BIDIL) 20-37.5 MG per tablet Take 2 tablets by mouth 3 (three) times daily. Patient taking differently: Take 1 tablet by mouth 3 (three) times daily.  11/29/14  Yes Nishant Dhungel, MD  levalbuterol (XOPENEX) 0.63 MG/3ML nebulizer solution Take 3 mLs (0.63 mg total) by nebulization every 6 (six) hours as needed for wheezing or shortness of breath. 11/29/14  Yes Nishant Dhungel, MD  losartan (COZAAR) 50 MG tablet Take 50 mg by mouth daily.   Yes Historical Provider, MD  polyethylene glycol (MIRALAX) packet Take 17 g by mouth daily. 05/18/15  Yes Varney Biles, MD  telmisartan (MICARDIS) 40 MG tablet Take 40 mg by mouth daily.   Yes Historical Provider, MD   Physical Exam: Filed Vitals:   07/31/15 0856 07/31/15 1059 07/31/15 1305 07/31/15 1315  BP: 230/80 215/82 218/80   Pulse: 58 59 62   Temp: 98.4 F (36.9 C)  98.4 F (36.9 C)   TempSrc: Oral  Oral   Resp: 16 16 18    Height:    5\' 11"  (1.803 m)  Weight:    108.863 kg (240 lb)  SpO2: 99% 98% 97%     Physical Exam  Constitutional: Appears well-developed and well-nourished. No distress. Obese. HENT: Normocephalic. No tonsillar erythema or exudates Eyes: Conjunctivae are normal. No scleral icterus.  Neck: Normal ROM. Neck supple. No JVD. No tracheal deviation. No thyromegaly.  CVS: RRR, S1/S2 +, no murmurs, no gallops, no carotid bruit.  Pulmonary: Effort and breath sounds normal, no  stridor, rhonchi, wheezes, rales.  Abdominal: Soft. BS +,  no distension, tenderness, rebound or guarding.  Musculoskeletal: Normal range of motion. Bilateral lower extremity lymphedema with chronic skin venous stasis changes. Erythema noted more on the left lower extremity and in the right few areas of weeping.  Lymphadenopathy: No lymphadenopathy noted, cervical, inguinal. Neuro: Alert. Normal reflexes, muscle tone coordination. No focal neurologic deficits. Skin: Skin is warm and dry.  Psychiatric: Normal mood and affect. Behavior, judgment, thought content normal.   Labs on Admission:  Basic Metabolic Panel:  Recent Labs Lab 07/31/15 1000  NA 139  K 4.1  CL 104  CO2 28  GLUCOSE 161*  BUN 29*  CREATININE 2.03*  CALCIUM 8.6*   Liver Function Tests: No results for input(s): AST, ALT, ALKPHOS, BILITOT, PROT, ALBUMIN in the last 168 hours. No results for input(s): LIPASE, AMYLASE in the last 168 hours. No results for input(s): AMMONIA in the last 168 hours. CBC:  Recent Labs Lab 07/31/15 1000  WBC 6.5  NEUTROABS 3.5  HGB 10.7*  HCT 34.0*  MCV 87.6  PLT 115*  Cardiac Enzymes: No results for input(s): CKTOTAL, CKMB, CKMBINDEX, TROPONINI in the last 168 hours. BNP: Invalid input(s): POCBNP CBG: No results for input(s): GLUCAP in the last 168 hours.  If 7PM-7AM, please contact night-coverage [email or web address] Password [email or web address] 07/31/2015, 1:24 PM

## 2015-07-31 NOTE — ED Notes (Signed)
Bed: [id number] Expected date:  Expected time:  Means of arrival:  Comments: Ems leg wound

## 2015-07-31 NOTE — ED Provider Notes (Signed)
CSN: [id number]     Arrival date & time 07/31/15  [id number] History   First MD Initiated Contact with Patient 07/31/15 [phone]     Chief Complaint  Patient presents with  . leg wound   . Hypertension   [name] is a 81 y.o. male with history of diabetes, arthritis, chronic kidney disease, CHF, CAD, and a-fib who presents to the ED from an extended stay hotel complaining of a wound to his left lower extremity that has worsened over this past week. He reports the wound was oozing since this weekend and he was unable to remove his pants as they were stuck to his leg. He also reports he has not been taking any of his medications, including his blood pressure and insulin. The patient reports he does not like the way his blood pressure medicines make him feel. Patient reports he's been seen in wound care previously but not recently. The patient has taken nothing for treatment today. The patient denies fevers, recent illness, chest pain, shortness of breath, new numbness, tingling or weakness, urinary symptoms, lightheadedness, dizziness, syncope, headache, new abdominal pain, or vomiting.  (Consider location/radiation/quality/duration/timing/severity/associated sxs/prior Treatment) HPI  Past Medical History  Diagnosis Date  . CHF (congestive heart failure) ([place])   . Diabetes mellitus without complication ([place])   . Hypertension   . Coronary artery disease   . Atrial fibrillation ([place])   . Chronic kidney disease    Past Surgical History  Procedure Laterality Date  . Leg surgery      Left leg surgery. broken leg  . Esophagogastroduodenoscopy  06/19/2012    Procedure: ESOPHAGOGASTRODUODENOSCOPY (EGD);  Surgeon: Beryle Beams, MD;  Location: [facility] ENDOSCOPY;  Service: Endoscopy;  Laterality: N/A;  . Colonoscopy  06/21/2012    Procedure: COLONOSCOPY;  Surgeon: Juanita Craver, MD;  Location: [facility] ENDOSCOPY;  Service: Endoscopy;  Laterality: N/A;  . Colonoscopy Left 02/13/2014    Procedure: COLONOSCOPY;   Surgeon: Juanita Craver, MD;  Location: [place];  Service: Endoscopy;  Laterality: Left;  . Laparoscopic right colectomy Right 02/15/2014    Procedure: LAPAROSCOPIC ASISSTED RIGHT  COLECTOMY, POSSIBLE OPEN;  Surgeon: Rolm Bookbinder, MD;  Location: [place];  Service: General;  Laterality: Right;   Family History  Problem Relation Age of Onset  . Brain cancer Mother   . Diabetes type II Sister   . Narcolepsy Paternal Uncle    Social History  Substance Use Topics  . Smoking status: Former Smoker    Quit date: 08/26/1998  . Smokeless tobacco: Never Used  . Alcohol Use: No     Comment: in the past    Review of Systems  Constitutional: Negative for fever and chills.  HENT: Negative for congestion and sore throat.   Eyes: Negative for visual disturbance.  Respiratory: Negative for cough, shortness of breath and wheezing.   Cardiovascular: Positive for leg swelling. Negative for chest pain and palpitations.  Gastrointestinal: Negative for nausea, vomiting and abdominal pain.  Genitourinary: Negative for dysuria.  Musculoskeletal: Positive for arthralgias. Negative for back pain and neck pain.  Skin: Positive for color change and wound.  Neurological: Negative for dizziness, syncope, light-headedness and headaches.      Allergies  Review of patient's allergies indicates no known allergies.  Home Medications   Prior to Admission medications   Medication Sig Start Date End Date Taking? Authorizing Provider  aspirin 81 MG tablet Take 1 tablet (81 mg total) by mouth daily. 11/29/14  Yes Nishant Dhungel, MD  bisacodyl (DULCOLAX) 10  MG suppository Place 1 suppository (10 mg total) rectally as needed for moderate constipation. 05/18/15  Yes Varney Biles, MD  carvedilol (COREG) 12.5 MG tablet Take 12.5 mg by mouth 2 (two) times daily as needed (For heart.).    Yes Historical Provider, MD  ferrous sulfate 325 (65 FE) MG tablet Take 1 tablet (325 mg total) by mouth 2 (two) times daily with  a meal. 11/29/14  Yes Nishant Dhungel, MD  furosemide (LASIX) 40 MG tablet Take 1 tablet (40 mg total) by mouth daily. Patient taking differently: Take 40 mg by mouth daily as needed (For swelling.).  01/10/14  Yes Denita Lung, MD  glimepiride (AMARYL) 2 MG tablet Take 1 tablet (2 mg total) by mouth daily with breakfast. 11/29/14  Yes Nishant Dhungel, MD  insulin aspart (NOVOLOG) 100 UNIT/ML injection Inject 0-15 Units into the skin 3 (three) times daily with meals. 11/29/14  Yes Nishant Dhungel, MD  insulin glargine (LANTUS) 100 UNIT/ML injection Inject 0.1 mLs (10 Units total) into the skin at bedtime. 11/29/14  Yes Nishant Dhungel, MD  isosorbide-hydrALAZINE (BIDIL) 20-37.5 MG per tablet Take 2 tablets by mouth 3 (three) times daily. Patient taking differently: Take 1 tablet by mouth 3 (three) times daily.  11/29/14  Yes Nishant Dhungel, MD  levalbuterol (XOPENEX) 0.63 MG/3ML nebulizer solution Take 3 mLs (0.63 mg total) by nebulization every 6 (six) hours as needed for wheezing or shortness of breath. 11/29/14  Yes Nishant Dhungel, MD  losartan (COZAAR) 50 MG tablet Take 50 mg by mouth daily.   Yes Historical Provider, MD  polyethylene glycol (MIRALAX) packet Take 17 g by mouth daily. 05/18/15  Yes Varney Biles, MD  telmisartan (MICARDIS) 40 MG tablet Take 40 mg by mouth daily.   Yes Historical Provider, MD   BP 207/71 mmHg  Pulse 55  Temp(Src) 98.4 F (36.9 C) (Oral)  Resp 16  Ht 5\' 11"  (1.803 m)  Wt 108.863 kg  BMI 33.49 kg/m2  SpO2 99% Physical Exam  Constitutional: He is oriented to person, place, and time. He appears well-developed and well-nourished. No distress.  Nontoxic appearing.  HENT:  Head: Normocephalic and atraumatic.  Mouth/Throat: Oropharynx is clear and moist.  Eyes: Conjunctivae are normal. Pupils are equal, round, and reactive to light. Right eye exhibits no discharge. Left eye exhibits no discharge.  Neck: Neck supple.  Cardiovascular: Normal rate, regular rhythm, normal  heart sounds and intact distal pulses.  Exam reveals no gallop and no friction rub.   No murmur heard. Bilateral dorsalis pedis pulses auscultated by Doppler.  Pulmonary/Chest: Effort normal and breath sounds normal. No respiratory distress. He has no wheezes. He has no rales.  Lungs clear to auscultation bilaterally.  Abdominal: Soft. He exhibits no distension. There is tenderness.  Mild epigastric tenderness to palpation that the patient report is chronic.   Musculoskeletal: He exhibits edema and tenderness.  Bilateral lower extremity edema with left leg wound oozing with overlying erythema. Left pants leg is stuck to his wound. Tenderness to his lower anterior shin. Leg compartments feel soft. Sensation is intact to his bilateral lower extremities.   Lymphadenopathy:    He has no cervical adenopathy.  Neurological: He is alert and oriented to person, place, and time. Coordination normal.  Skin: Skin is warm and dry. No rash noted. He is not diaphoretic. There is erythema. No pallor.  See musculoskeletal  Psychiatric: He has a normal mood and affect. His behavior is normal.  Nursing note and vitals reviewed.  ED Course  Procedures (including critical care time) Labs Review Labs Reviewed  BASIC METABOLIC PANEL - Abnormal; Notable for the following:    Glucose, Bld 161 (*)    BUN 29 (*)    Creatinine, Ser 2.03 (*)    Calcium 8.6 (*)    GFR calc non Af Amer 29 (*)    GFR calc Af Amer 34 (*)    All other components within normal limits  CBC WITH DIFFERENTIAL/PLATELET - Abnormal; Notable for the following:    RBC 3.88 (*)    Hemoglobin 10.7 (*)    HCT 34.0 (*)    Platelets 115 (*)    Monocytes Absolute 1.4 (*)    All other components within normal limits  WOUND CULTURE  CULTURE, BLOOD (ROUTINE X 2)  CULTURE, BLOOD (ROUTINE X 2)  BASIC METABOLIC PANEL  CBC WITH DIFFERENTIAL/PLATELET  HEMOGLOBIN A1C  HIV ANTIBODY (ROUTINE TESTING)  SEDIMENTATION RATE  C-REACTIVE PROTEIN   PREALBUMIN    Imaging Review Dg Tibia/fibula Left  07/31/2015  CLINICAL DATA:  Oozing wound in the distal third of the left tib/ fib region for 1 month. EXAM: LEFT TIBIA AND FIBULA - 2 VIEW COMPARISON:  MRI 01/20/2011 FINDINGS: Remote proximal tibial and fibular diaphysis fracture status post ORIF at the tibia. Fractures are healed. There is diffuse subcutaneous reticulation, greatest at the calf. No soft tissue gas or opaque foreign body (when accounting for bandage). No evidence of osteomyelitis. Knee and tibiotalar osteoarthritis with joint narrowing and spurring. Osteopenia. IMPRESSION: Diffuse leg swelling without soft tissue gas or acute osseous finding. Electronically Signed   By: Monte Fantasia M.D.   On: 07/31/2015 12:07   I have personally reviewed and evaluated these images and lab results as part of my medical decision-making.   EKG Interpretation None      Filed Vitals:   07/31/15 1059 07/31/15 1305 07/31/15 1315 07/31/15 1356  BP: 215/82 218/80  207/71  Pulse: 59 62  55  Temp:  98.4 F (36.9 C)    TempSrc:  Oral    Resp: 16 18  16   Height:   5\' 11"  (1.803 m)   Weight:   108.863 kg   SpO2: 98% 97%  99%     MDM   Meds given in ED:  Medications  bisacodyl (DULCOLAX) suppository 10 mg (not administered)  polyethylene glycol (MIRALAX / GLYCOLAX) packet 17 g (not administered)  aspirin EC tablet 81 mg (not administered)  ferrous sulfate tablet 325 mg (not administered)  glimepiride (AMARYL) tablet 2 mg (not administered)  insulin glargine (LANTUS) injection 10 Units (not administered)  isosorbide-hydrALAZINE (BIDIL) 20-37.5 MG per tablet 1 tablet (not administered)  albuterol (PROVENTIL) (2.5 MG/3ML) 0.083% nebulizer solution 2.5 mg (not administered)  sodium chloride 0.9 % injection 3 mL (3 mLs Intravenous Not Given 07/31/15 1345)  0.9 %  sodium chloride infusion ( Intravenous New Bag/Given 07/31/15 1358)  acetaminophen (TYLENOL) tablet 650 mg (not administered)     Or  acetaminophen (TYLENOL) suppository 650 mg (not administered)  HYDROcodone-acetaminophen (NORCO/VICODIN) 5-325 MG per tablet 1-2 tablet (not administered)  ondansetron (ZOFRAN) tablet 4 mg (not administered)    Or  ondansetron (ZOFRAN) injection 4 mg (not administered)  hydrALAZINE (APRESOLINE) tablet 25 mg (25 mg Oral Not Given 07/31/15 1347)  hydrALAZINE (APRESOLINE) injection 5 mg (5 mg Intravenous Given 07/31/15 1440)  clindamycin (CLEOCIN) IVPB 600 mg (600 mg Intravenous New Bag/Given 07/31/15 1350)  insulin aspart (novoLOG) injection 0-20 Units (not administered)  insulin aspart (novoLOG)  injection 0-5 Units (not administered)  carvedilol (COREG) tablet 12.5 mg (not administered)    Current Discharge Medication List      Final diagnoses:  Cellulitis of left lower extremity      Patient with left lower extremity cellulitis. Wound culture sent. Patient started on vancomycin in ED. X-rays unremarkable. Patient is in agreement with admission. Patient accepted for admission by Dr. Charlies Silvers.  This patient was discussed with and evaluated by Dr. Alvino Chapel who agrees with assessment and plan.  Aeryn Hoopingarner, PA-C 07/31/15 1631  Davonna Belling, MD 08/01/15 [phone]

## 2015-08-01 ENCOUNTER — Inpatient Hospital Stay (HOSPITAL_COMMUNITY): Payer: Medicare Other

## 2015-08-01 ENCOUNTER — Ambulatory Visit (HOSPITAL_COMMUNITY): Payer: Medicare Other

## 2015-08-01 DIAGNOSIS — M7989 Other specified soft tissue disorders: Secondary | ICD-10-CM

## 2015-08-01 LAB — BASIC METABOLIC PANEL
Anion gap: 7 (ref 5–15)
BUN: 34 mg/dL — AB (ref 6–20)
CALCIUM: 8.2 mg/dL — AB (ref 8.9–10.3)
CO2: 25 mmol/L (ref 22–32)
CREATININE: 2.43 mg/dL — AB (ref 0.61–1.24)
Chloride: 107 mmol/L (ref 101–111)
GFR calc non Af Amer: 24 mL/min — ABNORMAL LOW (ref >60)
GFR, EST AFRICAN AMERICAN: 28 mL/min — AB (ref >60)
Glucose, Bld: 129 mg/dL — ABNORMAL HIGH (ref 65–99)
Potassium: 4.2 mmol/L (ref 3.5–5.1)
Sodium: 139 mmol/L (ref 135–145)

## 2015-08-01 LAB — CBC
HEMATOCRIT: 26.8 % — AB (ref 39.0–52.0)
Hemoglobin: 8.5 g/dL — ABNORMAL LOW (ref 13.0–17.0)
MCH: 27.8 pg (ref 26.0–34.0)
MCHC: 31.7 g/dL (ref 30.0–36.0)
MCV: 87.6 fL (ref 78.0–100.0)
Platelets: 100 10*3/uL — ABNORMAL LOW (ref 150–400)
RBC: 3.06 MIL/uL — ABNORMAL LOW (ref 4.22–5.81)
RDW: 15.3 % (ref 11.5–15.5)
WBC: 6.8 10*3/uL (ref 4.0–10.5)

## 2015-08-01 LAB — GLUCOSE, CAPILLARY
GLUCOSE-CAPILLARY: 166 mg/dL — AB (ref 65–99)
GLUCOSE-CAPILLARY: 140 mg/dL — AB (ref 65–99)
Glucose-Capillary: 121 mg/dL — ABNORMAL HIGH (ref 65–99)

## 2015-08-01 LAB — HIV ANTIBODY (ROUTINE TESTING W REFLEX): HIV Screen 4th Generation wRfx: NONREACTIVE

## 2015-08-01 MED ORDER — DEXTROSE 5 % IV SOLN
2.0000 g | INTRAVENOUS | Status: DC
  Administered 2015-08-01 – 2015-08-04 (×4): 2 g via INTRAVENOUS
  Filled 2015-08-01 (×4): qty 2

## 2015-08-01 MED ORDER — PRO-STAT SUGAR FREE PO LIQD
30.0000 mL | Freq: Two times a day (BID) | ORAL | Status: DC
  Administered 2015-08-01 – 2015-08-05 (×4): 30 mL via ORAL
  Filled 2015-08-01 (×10): qty 30

## 2015-08-01 MED ORDER — FUROSEMIDE 10 MG/ML IJ SOLN
40.0000 mg | Freq: Once | INTRAMUSCULAR | Status: AC
  Administered 2015-08-01: 40 mg via INTRAVENOUS
  Filled 2015-08-01: qty 4

## 2015-08-01 NOTE — Progress Notes (Signed)
[facility] PROGRESS NOTE  [name] [id number] DOB: [birth year] DOA: 07/31/2015 PCP: Philis Fendt, MD  Assessment/Plan: #1 left lower extremity cellulitis/venous stasis changes chronic/lymphedema Left lower extremity with linear ulcerations and left calf tender to palpation. Lower extremity Dopplers negative for DVT. Wound care consulted and dressing changes have been recommended. Change IV clindamycin to IV Rocephin. Follow.  #2 acute on chronic kidney disease stage III Patient noncompliant with medications. Worsening renal function. Check a fractional excretion of sodium. 7 lock IV fluids. Will try dose of IV Lasix 1. Follow.  #3 uncontrolled hypertension Likely secondary to medical noncompliance. Patient refusing oral antihypertensive medications today. Patient has been advised that he needs to be compliant with his blood pressure medications. Continue current dose of Coreg, hydralazine, BiDil and uptitrate as needed.  #4 chronic diastolic heart failure Stable. 2-D echo from April 2016 with grade 2 diastolic dysfunction. Continue aspirin and Coreg. We'll give a dose of IV Lasix 1. Follow.  #5 uncontrolled diabetes mellitus with diabetic nephropathy with long-term use of insulin Hemoglobin A1c was 8.28 October 2014. Patient with medical noncompliance. CBGs have ranged from 121-166. Discontinue Amaryl. Continue Lantus. Continue sliding scale insulin. Diabetes coordinator following.  #6 thrombocytopenia Patient with no evidence of bleeding. Follow.  Co ofde Status: Full Family Communication: Updated patient. No family at bedside. Disposition Plan: Home when medically stable.   Consultants:  Wound care  Procedures:  ABIs 08/01/2015  Lower extremity Dopplers 08/01/2015  Plain films of the left tib-fib 07/31/2015  Antibiotics:  IV Rocephin 08/01/2015  IV clindamycin 07/31/2015>>>> 08/01/2015  HPI/Subjective: Patient with no complaints. Patient laying on  bed. Patient refusing blood pressure medications states his blood pressure has been high all his life and he is fine.  Objective: Filed Vitals:   07/31/15 2329 08/01/15 0434  BP: 166/65 156/63  Pulse: 63 65  Temp: 98.3 F (36.8 C) 98.5 F (36.9 C)  Resp: 18 18    Intake/Output Summary (Last 24 hours) at 08/01/15 1502 Last data filed at 08/01/15 0700  Gross per 24 hour  Intake    700 ml  Output    350 ml  Net    350 ml   Filed Weights   07/31/15 1315 08/01/15 0434  Weight: 108.863 kg (240 lb) 108.954 kg (240 lb 3.2 oz)    Exam:   General:  NAD  Cardiovascular: RRR  Respiratory: CTAB  Abdomen: Soft/NT/ND/+BS  Musculoskeletal: No c/c/. Left lower extremity with chronic lymphedema chronic venous stasis changes, linear break in skin. Left calf tender to palpation.  Data Reviewed: Basic Metabolic Panel:  Recent Labs Lab 07/31/15 1000 07/31/15 1637 08/01/15 0510  NA 139 141 139  K 4.1 4.1 4.2  CL 104 107 107  CO2 28 29 25   GLUCOSE 161* 184* 129*  BUN 29* 30* 34*  CREATININE 2.03* 1.90* 2.43*  CALCIUM 8.6* 8.7* 8.2*   Liver Function Tests: No results for input(s): AST, ALT, ALKPHOS, BILITOT, PROT, ALBUMIN in the last 168 hours. No results for input(s): LIPASE, AMYLASE in the last 168 hours. No results for input(s): AMMONIA in the last 168 hours. CBC:  Recent Labs Lab 07/31/15 1000 07/31/15 1637 08/01/15 0510  WBC 6.5 7.2 6.8  NEUTROABS 3.5 4.1  --   HGB 10.7* 10.3* 8.5*  HCT 34.0* 33.3* 26.8*  MCV 87.6 88.1 87.6  PLT 115* 104* 100*   Cardiac Enzymes: No results for input(s): CKTOTAL, CKMB, CKMBINDEX, TROPONINI in the last 168 hours. BNP (last 3 results)  Recent Labs  10/20/14 1503 11/23/14 1551  BNP 599.1* 978.4*    ProBNP (last 3 results) No results for input(s): PROBNP in the last 8760 hours.  CBG:  Recent Labs Lab 07/31/15 1708 07/31/15 2327 08/01/15 0803 08/01/15 1142  GLUCAP 157* 158* 121* 166*    Recent Results (from the  past 240 hour(s))  Wound culture     Status: None (Preliminary result)   Collection Time: 07/31/15 12:45 PM  Result Value Ref Range Status   Specimen Description LEG LEFT  Final   Special Requests NONE  Final   Gram Stain   Final    RARE WBC PRESENT, PREDOMINANTLY PMN NO SQUAMOUS EPITHELIAL CELLS SEEN ABUNDANT GRAM POSITIVE COCCI IN PAIRS Performed at [facility]    Culture   Final    Culture reincubated for better growth Performed at [facility]    Report Status PENDING  Incomplete  Blood Cultures x 2 sites     Status: None (Preliminary result)   Collection Time: 07/31/15  4:37 PM  Result Value Ref Range Status   Specimen Description   Final    BLOOD RIGHT ARM Performed at [place] Requests   Final    BOTTLES DRAWN AEROBIC AND ANAEROBIC 5CC Performed at [place]   Final    NO GROWTH < 24 HOURS Performed at [facility]    Report Status PENDING  Incomplete     Studies: Dg Tibia/fibula Left  07/31/2015  CLINICAL DATA:  Oozing wound in the distal third of the left tib/ fib region for 1 month. EXAM: LEFT TIBIA AND FIBULA - 2 VIEW COMPARISON:  MRI 01/20/2011 FINDINGS: Remote proximal tibial and fibular diaphysis fracture status post ORIF at the tibia. Fractures are healed. There is diffuse subcutaneous reticulation, greatest at the calf. No soft tissue gas or opaque foreign body (when accounting for bandage). No evidence of osteomyelitis. Knee and tibiotalar osteoarthritis with joint narrowing and spurring. Osteopenia. IMPRESSION: Diffuse leg swelling without soft tissue gas or acute osseous finding. Electronically Signed   By: Monte Fantasia M.D.   On: 07/31/2015 12:07    Scheduled Meds: . aspirin EC  81 mg Oral Daily  . carvedilol  12.5 mg Oral BID WC  . cefTRIAXone (ROCEPHIN)  IV  2 g Intravenous Q24H  . feeding supplement (PRO-STAT SUGAR FREE 64)  30 mL Oral BID  . ferrous sulfate   325 mg Oral BID WC  . hydrALAZINE  25 mg Oral 4 times per day  . insulin aspart  0-20 Units Subcutaneous TID WC  . insulin aspart  0-5 Units Subcutaneous QHS  . insulin glargine  10 Units Subcutaneous QHS  . isosorbide-hydrALAZINE  1 tablet Oral TID  . polyethylene glycol  17 g Oral Daily  . sodium chloride  3 mL Intravenous Q12H   Continuous Infusions:   Principal Problem:   Cellulitis of left lower extremity Active Problems:   Chronic acquired lymphedema   Acute renal failure superimposed on stage 3 chronic kidney disease (HCC)   Chronic diastolic CHF (congestive heart failure) (HCC)   Anemia of chronic renal failure, stage 3 (moderate)   Uncontrolled diabetes mellitus with diabetic nephropathy, with long-term current use of insulin ([place])   Accelerated hypertension   Thrombocytopenia ([place])    Time spent: 35 mins    [facility] MD [facility] Pager [phone]. If 7PM-7AM, please contact night-coverage at [email or web address], password [facility] 08/01/2015, 3:02 PM  LOS:  1 day

## 2015-08-01 NOTE — Progress Notes (Addendum)
Inpatient Diabetes Program Recommendations  AACE/ADA: New Consensus Statement on Inpatient Glycemic Control (2015)  Target Ranges:  Prepandial:   less than 140 mg/dL      Peak postprandial:   less than 180 mg/dL (1-2 hours)      Critically ill patients:  140 - 180 mg/dL    Results for PRIMUS, MCNAMARA (MRN [id number]) as of 08/01/2015 08:43  Ref. Range 07/31/2015 17:08 07/31/2015 23:27 08/01/2015 08:03  Glucose-Capillary Latest Ref Range: 65-99 mg/dL 157 (H) 158 (H) 121 (H)     Admit with: LE Cellulitis  History: DM, CHF, CKD, HTN  Home DM Meds: Lantus 10 units QHS       Novolog SSI       Amaryl 2 mg daily  Current Insulin Orders: Lantus 10 units QHS      Novolog Resistant SSI (0-20 units) TID AC + HS     -Per pharmacy documentation in Home Meds list, patient has not had Novolog or Lantus filled since April 2016.  -Per H&P, patient admits to non-compliance with insulin regimen.  -Note patient refused BP medications yesterday despite counseling by RN on the importance of taking meds.  -Current glucose levels well controlled on current regimen.  AM CBG today: 121 mg/dl.    -Current A1c pending.      --Will follow patient during hospitalization--  Wyn Quaker RN, MSN, CDE Diabetes Coordinator Inpatient Glycemic Control Team Team Pager: [phone] (8a-5p)

## 2015-08-01 NOTE — Progress Notes (Signed)
Initial Nutrition Assessment  DOCUMENTATION CODES:   Obesity unspecified  INTERVENTION:  - Will order 30 mL Prostat BID, each supplement provides 100 kcal and 15 grams of protein. - RD will continue to monitor for needs  NUTRITION DIAGNOSIS:   Increased nutrient needs related to wound healing as evidenced by estimated needs.  GOAL:   Patient will meet greater than or equal to 90% of their needs  MONITOR:   PO intake, Supplement acceptance, Weight trends, Labs, Skin, I & O's  REASON FOR ASSESSMENT:   Consult Wound healing  ASSESSMENT:   81 -year-old male with past medical history of insulin-dependent diabetes, chronic diastolic CHF (last 2-D echo in April 2016 with grade 2 diastolic dysfunction and preserved ejection fraction), hypertension, lower extremity edema who presented to [facility] ED with reports of worsening redness, pain and swelling in the left lower extremity. He reports having chronic swelling in legs but definitely has gotten worse in past few days. Patient takes Lasix on an as needed basis for lower extremity swelling. He has not been compliant with medications as prescribed. Patient reports no difficulty ambulating. Because of obesity his ambulation however is limited. Patient reports no fevers or chills. Patient reports no abdominal pain, nausea or vomiting. No reports of chest pain or palpitations. No reports of numbness in lower extremities.  Pt seen for consult. BMI indicates obesity. Per chart review, pt ate 100% of breakfast this AM. Visualized lunch tray in pt's room with 100% completion. Pt denies abdominal pain or nausea after eating meals today. He states "I always have a great appetite." He denies chewing or swallowing issues with any foods or liquids.  Notes indicate that pt was living at an Extended Stay hotel PTA. Pt denies changes in the way his clothes were fitting PTA. Chart review indicates 12 lb weight loss (5% body weight) in the past 9 months  which is not significant for time frame. No muscle or fat wasting noted during assessment.  Pt not interested in discussion with RD. He refused to make eye contact and remained fixated on television. Pt often asked RD to repeat questions. All responses were very brief in nature.   Pt likely meeting needs PTA based on information obtained. Medications reviewed. Labs reviewed; CBGs: 121-157 mg/dL, Ca: 8.2 mg/dL, BUN/creatinine elevated and trending up, GFR: 28.   Diet Order:  Diet Carb Modified Fluid consistency:: Thin; Room service appropriate?: Yes  Skin:  Wound (see comment) (L leg DM ulcer)  Last BM:  PTA  Height:   Ht Readings from Last 1 Encounters:  07/31/15 5\' 11"  (1.803 m)    Weight:   Wt Readings from Last 1 Encounters:  08/01/15 240 lb 3.2 oz (108.954 kg)    Ideal Body Weight:  78.18 kg (kg)  BMI:  Body mass index is 33.52 kg/(m^2).  Estimated Nutritional Needs:   Kcal:  2175-2400  Protein:  90-105 grams  Fluid:  2.2-2.5 L/day  EDUCATION NEEDS:   No education needs identified at this time     Jarome Matin, RD, LDN Inpatient Clinical Dietitian Pager # [phone] After hours/weekend pager # [phone]

## 2015-08-01 NOTE — Progress Notes (Signed)
Patient requested items from security. 2 wallets, 3 sets of keys, and total of $103.00 were given back to patient who then handed it to daughter.  All personal belongings were sent home with daughter and boyfriend, per patient request.

## 2015-08-01 NOTE — Progress Notes (Signed)
VASCULAR LAB PRELIMINARY  PRELIMINARY  PRELIMINARY  PRELIMINARY  Bilateral lower extremity venous duplex  completed.    Preliminary report:  Bilateral:  No obvious evidence of DVT, superficial thrombosis, or Baker's Cyst.  Bilateral peroneal veins not adequately visualized.    Rogena Deupree, RVT 08/01/2015, 3:19 PM

## 2015-08-01 NOTE — Progress Notes (Signed)
VASCULAR LAB PRELIMINARY  ARTERIAL  ABI completed:  Bilateral ABIs indicate moderate decrease in flow.  Bilateral TBIs are abnormal.    RIGHT    LEFT    PRESSURE WAVEFORM  PRESSURE WAVEFORM  BRACHIAL 109 Triphasic  BRACHIAL 129 Triphasic   DP 77 Monophasic  DP 49 Monophasic   AT   AT    PT 89 Monophasic  PT 91 Monophasic   PER   PER    GREAT TOE 52  GREAT TOE 60     RIGHT LEFT  ABI 0.69 0.71  TBI 0.4 0.47     Nargis Abrams, RVT 08/01/2015, 11:43 AM

## 2015-08-01 NOTE — Progress Notes (Signed)
CSW met with patient today- he reports he has been living at the [facility]- he also has a local daughter. CSW proposed patient consider possible ALF or SNF at dc if he meets criteria- he was in agreement to consider. CSW will proceed in assessment to determine if he is eligible for SNF or ALF and pursue this option.  Full assessment to follow-  Eduard Clos, MSW, [place]

## 2015-08-02 DIAGNOSIS — I10 Essential (primary) hypertension: Secondary | ICD-10-CM

## 2015-08-02 DIAGNOSIS — I890 Lymphedema, not elsewhere classified: Secondary | ICD-10-CM

## 2015-08-02 DIAGNOSIS — N179 Acute kidney failure, unspecified: Secondary | ICD-10-CM

## 2015-08-02 DIAGNOSIS — N183 Chronic kidney disease, stage 3 (moderate): Secondary | ICD-10-CM

## 2015-08-02 DIAGNOSIS — L03116 Cellulitis of left lower limb: Principal | ICD-10-CM

## 2015-08-02 LAB — GLUCOSE, CAPILLARY
GLUCOSE-CAPILLARY: 153 mg/dL — AB (ref 65–99)
GLUCOSE-CAPILLARY: 138 mg/dL — AB (ref 65–99)
Glucose-Capillary: 146 mg/dL — ABNORMAL HIGH (ref 65–99)
Glucose-Capillary: 138 mg/dL — ABNORMAL HIGH (ref 65–99)
Glucose-Capillary: 168 mg/dL — ABNORMAL HIGH (ref 65–99)

## 2015-08-02 LAB — CBC
HCT: 26.2 % — ABNORMAL LOW (ref 39.0–52.0)
Hemoglobin: 8.2 g/dL — ABNORMAL LOW (ref 13.0–17.0)
MCH: 27.7 pg (ref 26.0–34.0)
MCHC: 31.3 g/dL (ref 30.0–36.0)
MCV: 88.5 fL (ref 78.0–100.0)
PLATELETS: 88 10*3/uL — AB (ref 150–400)
RBC: 2.96 MIL/uL — AB (ref 4.22–5.81)
RDW: 15.5 % (ref 11.5–15.5)
WBC: 5.7 10*3/uL (ref 4.0–10.5)

## 2015-08-02 LAB — BASIC METABOLIC PANEL
Anion gap: 8 (ref 5–15)
BUN: 51 mg/dL — ABNORMAL HIGH (ref 6–20)
CALCIUM: 8.1 mg/dL — AB (ref 8.9–10.3)
CO2: 23 mmol/L (ref 22–32)
CREATININE: 3.28 mg/dL — AB (ref 0.61–1.24)
Chloride: 107 mmol/L (ref 101–111)
GFR, EST AFRICAN AMERICAN: 19 mL/min — AB (ref >60)
GFR, EST NON AFRICAN AMERICAN: 17 mL/min — AB (ref >60)
GLUCOSE: 178 mg/dL — AB (ref 65–99)
Potassium: 4.2 mmol/L (ref 3.5–5.1)
Sodium: 138 mmol/L (ref 135–145)

## 2015-08-02 LAB — HEMOGLOBIN A1C
Hgb A1c MFr Bld: 6.8 % — ABNORMAL HIGH (ref 4.8–5.6)
MEAN PLASMA GLUCOSE: 148 mg/dL

## 2015-08-02 LAB — BRAIN NATRIURETIC PEPTIDE: B Natriuretic Peptide: 191.9 pg/mL — ABNORMAL HIGH (ref 0.0–100.0)

## 2015-08-02 NOTE — Progress Notes (Signed)
[facility] PROGRESS NOTE  Hariharan Kloos [id number] DOB: [birth year] DOA: 07/31/2015 PCP: Philis Fendt, MD  HPI/Brief narrative 81 -year-old male with past medical history of insulin-dependent diabetes, chronic diastolic CHF (last 2-D echo in April 2016 with grade 2 diastolic dysfunction and preserved ejection fraction), hypertension, lower extremity edema who presented to [facility] ED with reports of worsening redness, pain and swelling in the left lower extremity. He reports having chronic swelling in legs but definitely has gotten worse in past few days. Patient takes Lasix on an as needed basis for lower extremity swelling. He has not been compliant with medications as prescribed  Assessment/Plan: #1 left lower extremity cellulitis/venous stasis changes chronic/lymphedema -Left lower extremity with linear ulcerations and left calf tender to palpation.  -Lower extremity Dopplers negative for DVT.  -Wound care was consulted with dressing changes recommended. Currently on IV Rocephin.  #2 acute on chronic kidney disease stage III -Patient noncompliant with medications. Worsening renal function. Was given lasix recently. Will give trial of gentile IVF  #3 uncontrolled hypertension -Likely secondary to medical noncompliance.  -Patient has been advised that he needs to be compliant with his blood pressure medications. -Continue current dose of Coreg, hydralazine, BiDil and uptitrate as needed. -Has been refusing bp meds while inpatient  #4 chronic diastolic heart failure -Stable. 2-D echo from April 2016 with grade 2 diastolic dysfunction. Continue aspirin and Coreg.  #5 uncontrolled diabetes mellitus with diabetic nephropathy with long-term use of insulin -Hemoglobin A1c was 8.28 October 2014.  -Patient with medical noncompliance.  -CBGs have ranged from 121-166.  -Discontinue Amaryl. Continue Lantus.  -Continue sliding scale insulin.  -Diabetes coordinator following.  #6  thrombocytopenia -Patient with no evidence of bleeding.   Code Status: Full Family Communication: Pt in room Disposition Plan: Possible SNF when renal function improves   Consultants:    Procedures:    Antibiotics: Anti-infectives    Start     Dose/Rate Route Frequency Ordered Stop   08/01/15 1500  vancomycin (VANCOCIN) IVPB 1000 mg/200 mL premix  Status:  Discontinued     1,000 mg 200 mL/hr over 60 Minutes Intravenous Every 24 hours 07/31/15 1325 07/31/15 1336   08/01/15 1000  cefTRIAXone (ROCEPHIN) 2 g in dextrose 5 % 50 mL IVPB     2 g 100 mL/hr over 30 Minutes Intravenous Every 24 hours 08/01/15 0809     07/31/15 1400  clindamycin (CLEOCIN) IVPB 600 mg  Status:  Discontinued     600 mg 100 mL/hr over 30 Minutes Intravenous 3 times per day 07/31/15 1316 08/01/15 0808   07/31/15 1345  vancomycin (VANCOCIN) 2,000 mg in sodium chloride 0.9 % 500 mL IVPB  Status:  Discontinued     2,000 mg 250 mL/hr over 120 Minutes Intravenous  Once 07/31/15 1322 07/31/15 1336       HPI/Subjective: No complaints today  Objective: Filed Vitals:   08/02/15 0543 08/02/15 0619 08/02/15 0846 08/02/15 1600  BP: 159/61   161/60  Pulse: 50  68 52  Temp: 98 F (36.7 C)   97.8 F (36.6 C)  TempSrc: Oral   Oral  Resp:    18  Height:      Weight:  110.9 kg (244 lb 7.8 oz)    SpO2: 98%   99%    Intake/Output Summary (Last 24 hours) at 08/02/15 1619 Last data filed at 08/02/15 0900  Gross per 24 hour  Intake   1090 ml  Output    400 ml  Net  690 ml   Filed Weights   07/31/15 1315 08/01/15 0434 08/02/15 0619  Weight: 108.863 kg (240 lb) 108.954 kg (240 lb 3.2 oz) 110.9 kg (244 lb 7.8 oz)    Exam:   General:  Awake, in nad  Cardiovascular: regular, s1, s2  Respiratory: normal resp effort, no wheezing  Abdomen: soft,nondistended  Musculoskeletal: perfused, no clubbing, BLE with dressings in place   Data Reviewed: Basic Metabolic Panel:  Recent Labs Lab 07/31/15 1000  07/31/15 1637 08/01/15 0510 08/02/15 0510  NA 139 141 139 138  K 4.1 4.1 4.2 4.2  CL 104 107 107 107  CO2 28 29 25 23   GLUCOSE 161* 184* 129* 178*  BUN 29* 30* 34* 51*  CREATININE 2.03* 1.90* 2.43* 3.28*  CALCIUM 8.6* 8.7* 8.2* 8.1*   Liver Function Tests: No results for input(s): AST, ALT, ALKPHOS, BILITOT, PROT, ALBUMIN in the last 168 hours. No results for input(s): LIPASE, AMYLASE in the last 168 hours. No results for input(s): AMMONIA in the last 168 hours. CBC:  Recent Labs Lab 07/31/15 1000 07/31/15 1637 08/01/15 0510 08/02/15 0510  WBC 6.5 7.2 6.8 5.7  NEUTROABS 3.5 4.1  --   --   HGB 10.7* 10.3* 8.5* 8.2*  HCT 34.0* 33.3* 26.8* 26.2*  MCV 87.6 88.1 87.6 88.5  PLT 115* 104* 100* 88*   Cardiac Enzymes: No results for input(s): CKTOTAL, CKMB, CKMBINDEX, TROPONINI in the last 168 hours. BNP (last 3 results)  Recent Labs  10/20/14 1503 11/23/14 1551 08/02/15 0510  BNP 599.1* 978.4* 191.9*    ProBNP (last 3 results) No results for input(s): PROBNP in the last 8760 hours.  CBG:  Recent Labs Lab 08/01/15 1142 08/01/15 1642 08/01/15 2324 08/02/15 0740 08/02/15 1151  GLUCAP 166* 140* 153* 146* 138*    Recent Results (from the past 240 hour(s))  Wound culture     Status: None (Preliminary result)   Collection Time: 07/31/15 12:45 PM  Result Value Ref Range Status   Specimen Description LEG LEFT  Final   Special Requests NONE  Final   Gram Stain   Final    RARE WBC PRESENT, PREDOMINANTLY PMN NO SQUAMOUS EPITHELIAL CELLS SEEN ABUNDANT GRAM POSITIVE COCCI IN PAIRS Performed at [facility]    Culture   Final    ABUNDANT STAPHYLOCOCCUS AUREUS Note: RIFAMPIN AND GENTAMICIN SHOULD NOT BE USED AS SINGLE DRUGS FOR TREATMENT OF STAPH INFECTIONS. Performed at [facility]    Report Status PENDING  Incomplete  Blood Cultures x 2 sites     Status: None (Preliminary result)   Collection Time: 07/31/15  4:37 PM  Result Value Ref Range  Status   Specimen Description   Final    BLOOD RIGHT ARM Performed at [place] Request[facility]   Final    BOTTLES DRAWN AEROBIC AND ANAEROBIC 5CC Performed at [place]   Final    NO GROWTH 2 DAYS Performed at [facility]    Report Status PENDING  Incomplete  Blood Cultures x 2 sites     Status: None (Preliminary result)   Collection Time: 07/31/15  9:14 PM  Result Value Ref Range Status   Specimen Description BLOOD RIGHT ANTECUBITAL  Final   Special Requests BOTTLES DRAWN AEROBIC AND ANAEROBIC 10CC  Final   Culture   Final    NO GROWTH 1 DAY Performed at [facility]    Report Status PENDING  Incomplete     Studies: No results found.  Scheduled Meds: . aspirin EC  81 mg Oral Daily  . carvedilol  12.5 mg Oral BID WC  . cefTRIAXone (ROCEPHIN)  IV  2 g Intravenous Q24H  . feeding supplement (PRO-STAT SUGAR FREE 64)  30 mL Oral BID  . ferrous sulfate  325 mg Oral BID WC  . hydrALAZINE  25 mg Oral 4 times per day  . insulin aspart  0-20 Units Subcutaneous TID WC  . insulin aspart  0-5 Units Subcutaneous QHS  . insulin glargine  10 Units Subcutaneous QHS  . isosorbide-hydrALAZINE  1 tablet Oral TID  . polyethylene glycol  17 g Oral Daily  . sodium chloride  3 mL Intravenous Q12H   Continuous Infusions:   Principal Problem:   Cellulitis of left lower extremity Active Problems:   Chronic acquired lymphedema   Acute renal failure superimposed on stage 3 chronic kidney disease (HCC)   Chronic diastolic CHF (congestive heart failure) (HCC)   Anemia of chronic renal failure, stage 3 (moderate)   Uncontrolled diabetes mellitus with diabetic nephropathy, with long-term current use of insulin ([place])   Accelerated hypertension   Thrombocytopenia ([place])   Rayan Dyal, [place] [facility] Pager [phone]. If 7PM-7AM, please contact night-coverage at [email or web address], password [facility] 08/02/2015, 4:19 PM  LOS:  2 days

## 2015-08-02 NOTE — NC FL2 (Signed)
[place] LEVEL OF CARE SCREENING TOOL     IDENTIFICATION  Patient Name: [name] Birthdate: [birth year] Sex: male Admission Date (Current Location): 07/31/2015  [facility] and [place] Number: Herbalist and Address:  [facility],  [place], [place]      Provider Number: [id number]  Attending Physician Name and Address:  Donne Hazel, MD  Relative Name and Phone Number:       Current Level of Care: Hospital Recommended Level of Care: [place] Prior Approval Number:    Date Approved/Denied:   PASRR Number:    Discharge Plan: SNF    Current Diagnoses: Patient Active Problem List   Diagnosis Date Noted  . Chronic diastolic CHF (congestive heart failure) ([place]) 07/31/2015  . Cellulitis of left lower extremity 07/31/2015  . Anemia of chronic renal failure, stage 3 (moderate) 07/31/2015  . Uncontrolled diabetes mellitus with diabetic nephropathy, with long-term current use of insulin ([place]) 07/31/2015  . Accelerated hypertension 07/31/2015  . Thrombocytopenia ([place]) 07/31/2015  . Acute renal failure superimposed on stage 3 chronic kidney disease ([place]) 11/29/2014  . Chronic acquired lymphedema 06/25/2011    Orientation ACTIVITIES/SOCIAL BLADDER RESPIRATION    Self, Time, Situation, Place  Active, Group participation   Normal  BEHAVIORAL SYMPTOMS/MOOD NEUROLOGICAL BOWEL NUTRITION STATUS      Continent Diet (Carb modified)  PHYSICIAN VISITS COMMUNICATION OF NEEDS Height & Weight Skin    Verbally 5\' 11"  (180.3 cm) 244 lbs. Other (Comment) (Left leg ulcer- see WOC note)          AMBULATORY STATUS RESPIRATION    Assist independent Normal      Personal Care Assistance Level of Assistance               Functional Limitations Info                SPECIAL CARE FACTORS FREQUENCY   (Wound care)                   Additional Factors Info  Code Status, Allergies Code Status Info:   (Full code) Allergies Info:  (NKDA)           Current Medications (08/02/2015):  This is the current hospital active medication list Current Facility-Administered Medications  Medication Dose Route Frequency Provider Last Rate Last Dose  . acetaminophen (TYLENOL) tablet 650 mg  650 mg Oral Q6H PRN Robbie Lis, MD       Or  . acetaminophen (TYLENOL) suppository 650 mg  650 mg Rectal Q6H PRN Robbie Lis, MD      . albuterol (PROVENTIL) (2.5 MG/3ML) 0.083% nebulizer solution 2.5 mg  2.5 mg Nebulization Q6H PRN Robbie Lis, MD      . aspirin EC tablet 81 mg  81 mg Oral Daily Robbie Lis, MD   81 mg at 08/02/15 0844  . bisacodyl (DULCOLAX) suppository 10 mg  10 mg Rectal PRN Robbie Lis, MD      . carvedilol (COREG) tablet 12.5 mg  12.5 mg Oral BID WC Robbie Lis, MD   12.5 mg at 08/02/15 0845  . cefTRIAXone (ROCEPHIN) 2 g in dextrose 5 % 50 mL IVPB  2 g Intravenous Q24H Eugenie Filler, MD   2 g at 08/02/15 0854  . feeding supplement (PRO-STAT SUGAR FREE 64) liquid 30 mL  30 mL Oral BID Maricela Bo Ostheim, RD   30 mL at 08/01/15 1653  .  ferrous sulfate tablet 325 mg  325 mg Oral BID WC Robbie Lis, MD   325 mg at 08/02/15 0845  . hydrALAZINE (APRESOLINE) injection 5 mg  5 mg Intravenous Q6H PRN Robbie Lis, MD   5 mg at 07/31/15 1440  . hydrALAZINE (APRESOLINE) tablet 25 mg  25 mg Oral 4 times per day Robbie Lis, MD   25 mg at 08/02/15 0610  . HYDROcodone-acetaminophen (NORCO/VICODIN) 5-325 MG per tablet 1-2 tablet  1-2 tablet Oral Q4H PRN Robbie Lis, MD      . insulin aspart (novoLOG) injection 0-20 Units  0-20 Units Subcutaneous TID [facility] Robbie Lis, MD   3 Units at 08/02/15 [id number]  . insulin aspart (novoLOG) injection 0-5 Units  0-5 Units Subcutaneous QHS Robbie Lis, MD   0 Units at 07/31/15 2200  . insulin glargine (LANTUS) injection 10 Units  10 Units Subcutaneous QHS Robbie Lis, MD   10 Units at 08/01/15 2325  . isosorbide-hydrALAZINE (BIDIL) 20-37.5 MG per  tablet 1 tablet  1 tablet Oral TID Robbie Lis, MD   1 tablet at 08/02/15 0844  . ondansetron (ZOFRAN) tablet 4 mg  4 mg Oral Q6H PRN Robbie Lis, MD       Or  . ondansetron [facility]) injection 4 mg  4 mg Intravenous Q6H PRN Robbie Lis, MD      . polyethylene glycol (MIRALAX / GLYCOLAX) packet 17 g  17 g Oral Daily Robbie Lis, MD   17 g at 07/31/15 1430  . sodium chloride 0.9 % injection 3 mL  3 mL Intravenous Q12H Robbie Lis, MD   3 mL at 08/02/15 [id number]     Discharge Medications: Please see discharge summary for a list of discharge medications.  Relevant Imaging Results:  Relevant Lab Results:  Recent Labs    Additional Information  (SSN [id number])  Ludwig Clarks, LCSW

## 2015-08-02 NOTE — NC FL2 (Deleted)
[place] LEVEL OF CARE SCREENING TOOL     IDENTIFICATION  Patient Name: [name] Birthdate: Nov 12, 1933 Sex: male Admission Date (Current Location): 07/31/2015  [facility] and [place] Number: Herbalist and Address:  [facility],  [place], [place]      Provider Number: [id number]  Attending Physician Name and Address:  Donne Hazel, MD  Relative Name and Phone Number:       Current Level of Care: Hospital Recommended Level of Care: [place] Prior Approval Number:    Date Approved/Denied:   PASRR Number:    Discharge Plan: Other (Comment) (ALF)    Current Diagnoses: Patient Active Problem List   Diagnosis Date Noted  . Chronic diastolic CHF (congestive heart failure) ([place]) 07/31/2015  . Cellulitis of left lower extremity 07/31/2015  . Anemia of chronic renal failure, stage 3 (moderate) 07/31/2015  . Uncontrolled diabetes mellitus with diabetic nephropathy, with long-term current use of insulin ([place]) 07/31/2015  . Accelerated hypertension 07/31/2015  . Thrombocytopenia ([place]) 07/31/2015  . Acute renal failure superimposed on stage 3 chronic kidney disease ([place]) 11/29/2014  . Chronic acquired lymphedema 06/25/2011    Orientation ACTIVITIES/SOCIAL BLADDER RESPIRATION    Self, Time, Situation, Place  Active, Group participation   Normal  BEHAVIORAL SYMPTOMS/MOOD NEUROLOGICAL BOWEL NUTRITION STATUS      Continent Diet (Carb modified)  PHYSICIAN VISITS COMMUNICATION OF NEEDS Height & Weight Skin    Verbally 5\' 11"  (180.3 cm) 244 lbs. Other (Comment) (Left leg ulcer- see WOC note)          AMBULATORY STATUS RESPIRATION    Assist independent Normal      Personal Care Assistance Level of Assistance               Functional Limitations Info                SPECIAL CARE FACTORS FREQUENCY   (Wound care)                   Additional Factors Info  Code Status, Allergies  Code Status Info:  (Full code) Allergies Info:  (NKDA)           Current Medications (08/02/2015):  This is the current hospital active medication list Current Facility-Administered Medications  Medication Dose Route Frequency Provider Last Rate Last Dose  . acetaminophen (TYLENOL) tablet 650 mg  650 mg Oral Q6H PRN Robbie Lis, MD       Or  . acetaminophen (TYLENOL) suppository 650 mg  650 mg Rectal Q6H PRN Robbie Lis, MD      . albuterol (PROVENTIL) (2.5 MG/3ML) 0.083% nebulizer solution 2.5 mg  2.5 mg Nebulization Q6H PRN Robbie Lis, MD      . aspirin EC tablet 81 mg  81 mg Oral Daily Robbie Lis, MD   81 mg at 08/02/15 0844  . bisacodyl (DULCOLAX) suppository 10 mg  10 mg Rectal PRN Robbie Lis, MD      . carvedilol (COREG) tablet 12.5 mg  12.5 mg Oral BID WC Robbie Lis, MD   12.5 mg at 08/02/15 0845  . cefTRIAXone (ROCEPHIN) 2 g in dextrose 5 % 50 mL IVPB  2 g Intravenous Q24H Eugenie Filler, MD   2 g at 08/02/15 0854  . feeding supplement (PRO-STAT SUGAR FREE 64) liquid 30 mL  30 mL Oral BID Maricela Bo Ostheim, RD   30 mL at 08/01/15 1653  .  ferrous sulfate tablet 325 mg  325 mg Oral BID WC Robbie Lis, MD   325 mg at 08/02/15 0845  . hydrALAZINE (APRESOLINE) injection 5 mg  5 mg Intravenous Q6H PRN Robbie Lis, MD   5 mg at 07/31/15 1440  . hydrALAZINE (APRESOLINE) tablet 25 mg  25 mg Oral 4 times per day Robbie Lis, MD   25 mg at 08/02/15 0610  . HYDROcodone-acetaminophen (NORCO/VICODIN) 5-325 MG per tablet 1-2 tablet  1-2 tablet Oral Q4H PRN Robbie Lis, MD      . insulin aspart (novoLOG) injection 0-20 Units  0-20 Units Subcutaneous TID [facility] Robbie Lis, MD   3 Units at 08/02/15 [id number]  . insulin aspart (novoLOG) injection 0-5 Units  0-5 Units Subcutaneous QHS Robbie Lis, MD   0 Units at 07/31/15 2200  . insulin glargine (LANTUS) injection 10 Units  10 Units Subcutaneous QHS Robbie Lis, MD   10 Units at 08/01/15 2325  . isosorbide-hydrALAZINE (BIDIL)  20-37.5 MG per tablet 1 tablet  1 tablet Oral TID Robbie Lis, MD   1 tablet at 08/02/15 0844  . ondansetron (ZOFRAN) tablet 4 mg  4 mg Oral Q6H PRN Robbie Lis, MD       Or  . ondansetron [facility]) injection 4 mg  4 mg Intravenous Q6H PRN Robbie Lis, MD      . polyethylene glycol (MIRALAX / GLYCOLAX) packet 17 g  17 g Oral Daily Robbie Lis, MD   17 g at 07/31/15 1430  . sodium chloride 0.9 % injection 3 mL  3 mL Intravenous Q12H Robbie Lis, MD   3 mL at 08/02/15 [id number]     Discharge Medications: Please see discharge summary for a list of discharge medications.  Relevant Imaging Results:  Relevant Lab Results:  Recent Labs    Additional Information  (SSN [id number])  Ludwig Clarks, LCSW

## 2015-08-03 DIAGNOSIS — Z91148 Patient's other noncompliance with medication regimen for other reason: Secondary | ICD-10-CM

## 2015-08-03 DIAGNOSIS — D631 Anemia in chronic kidney disease: Secondary | ICD-10-CM

## 2015-08-03 DIAGNOSIS — D696 Thrombocytopenia, unspecified: Secondary | ICD-10-CM

## 2015-08-03 DIAGNOSIS — Z9114 Patient's other noncompliance with medication regimen: Secondary | ICD-10-CM

## 2015-08-03 LAB — GLUCOSE, CAPILLARY
GLUCOSE-CAPILLARY: 144 mg/dL — AB (ref 65–99)
GLUCOSE-CAPILLARY: 127 mg/dL — AB (ref 65–99)
Glucose-Capillary: 130 mg/dL — ABNORMAL HIGH (ref 65–99)
Glucose-Capillary: 201 mg/dL — ABNORMAL HIGH (ref 65–99)

## 2015-08-03 LAB — BASIC METABOLIC PANEL
Anion gap: 6 (ref 5–15)
BUN: 52 mg/dL — ABNORMAL HIGH (ref 6–20)
CHLORIDE: 109 mmol/L (ref 101–111)
CO2: 24 mmol/L (ref 22–32)
CREATININE: 2.85 mg/dL — AB (ref 0.61–1.24)
Calcium: 8.4 mg/dL — ABNORMAL LOW (ref 8.9–10.3)
GFR calc non Af Amer: 20 mL/min — ABNORMAL LOW (ref >60)
GFR, EST AFRICAN AMERICAN: 23 mL/min — AB (ref >60)
Glucose, Bld: 137 mg/dL — ABNORMAL HIGH (ref 65–99)
POTASSIUM: 4.7 mmol/L (ref 3.5–5.1)
Sodium: 139 mmol/L (ref 135–145)

## 2015-08-03 LAB — CBC
HEMATOCRIT: 26.2 % — AB (ref 39.0–52.0)
HEMOGLOBIN: 8.2 g/dL — AB (ref 13.0–17.0)
MCH: 27.2 pg (ref 26.0–34.0)
MCHC: 31.3 g/dL (ref 30.0–36.0)
MCV: 86.8 fL (ref 78.0–100.0)
PLATELETS: 88 10*3/uL — AB (ref 150–400)
RBC: 3.02 MIL/uL — AB (ref 4.22–5.81)
RDW: 15.3 % (ref 11.5–15.5)
WBC: 6.7 10*3/uL (ref 4.0–10.5)

## 2015-08-03 LAB — WOUND CULTURE

## 2015-08-03 MED ORDER — SODIUM CHLORIDE 0.9 % IV SOLN
INTRAVENOUS | Status: AC
  Administered 2015-08-03: 08:00:00 via INTRAVENOUS

## 2015-08-03 MED ORDER — HYDRALAZINE HCL 20 MG/ML IJ SOLN
10.0000 mg | Freq: Once | INTRAMUSCULAR | Status: AC
  Administered 2015-08-03: 10 mg via INTRAVENOUS
  Filled 2015-08-03: qty 1

## 2015-08-03 NOTE — Progress Notes (Signed)
BP 216/60. Writer reeducated pt on the importance of keeping blood pressure within a  normal range. Pt verbalized understanding and still refusing to take medication. MD made aware and new orders placed in Epic. Will continue to monitor.

## 2015-08-03 NOTE — Clinical Social Work Placement (Signed)
   CLINICAL SOCIAL WORK PLACEMENT  NOTE  Date:  08/03/2015  Patient Details  Name: [name] MRN: [id number] Date of Birth: 1934/05/15  Clinical Social Work is seeking post-discharge placement for this patient at the [place] level of care (*CSW will initial, date and re-position this form in  chart as items are completed):  Yes   Patient/family provided with [facility] Work Department's list of facilities offering this level of care within the geographic area requested by the patient (or if unable, by the patient's family).  Yes   Patient/family informed of their freedom to choose among providers that offer the needed level of care, that participate in Medicare, Medicaid or managed care program needed by the patient, have an available bed and are willing to accept the patient.  Yes   Patient/family informed of [facility]'s ownership interest in [facility] and [facility], as well as of the fact that they are under no obligation to receive care at these facilities.  PASRR submitted to EDS on       PASRR number received on       Existing PASRR number confirmed on 08/03/15     FL2 transmitted to all facilities in geographic area requested by pt/family on 08/02/15     FL2 transmitted to all facilities within larger geographic area on       Patient informed that his/her managed care company has contracts with or will negotiate with certain facilities, including the following:        Yes   Patient/family informed of bed offers received.  Patient chooses bed at [facility]     Physician recommends and patient chooses bed at      Patient to be transferred to  ([facility]) on 08/04/15.  Patient to be transferred to facility by  [facility])     Patient family notified on   of transfer.  Name of family member notified:        PHYSICIAN Please prepare priority discharge summary, including medications, Please sign FL2, Please  prepare prescriptions     Additional Comment:    _______________________________________________ Ludwig Clarks, LCSW 08/03/2015, 3:34 PM

## 2015-08-03 NOTE — Clinical Social Work Note (Signed)
Clinical Social Work Assessment  Patient Details  Name: [name] MRN: [id number] Date of Birth: 02-16-1934  Date of referral:  08/02/15               Reason for consult:  Facility Placement                Permission sought to share information with:  Family Supports Permission granted to share information::  No  Name::        Agency::     Relationship::     Contact Information:     Housing/Transportation Living arrangements for the past 2 months:  Hotel/Motel Source of Information:  Patient Patient Interpreter Needed:  None Criminal Activity/Legal Involvement Pertinent to Current Situation/Hospitalization:  No - Comment as needed Significant Relationships:  Friend, Adult Children Lives with:  Self Do you feel safe going back to the place where you live?  No Need for family participation in patient care:  No (Coment)  Care giving concerns:  Patient in poor medical condition at time of admission- living in [place].   Social Worker assessment / plan:  CSW met with patient to discuss possible dc needs- patient reports living in a motel where he pays @$500 monthly. He has a truck as well. Patient reports he would consider placement at dc. CSW discussed at length the options to consider including ALF and SNF- as well as the cost/coverage for both. Patient agrees to CSW seeking options for placement at [facility]-  Employment status:  Retired Forensic scientist:  Medicare PT Recommendations:  [place] / Referral to community resources:  [place]  Patient/Family's Response to care:  Patient appears to understand his plan of care including "dressing changes 2 times a day" to his leg wound as well as his for overall medical and physical care.   Patient/Family's Understanding of and Emotional Response to Diagnosis, Current Treatment, and Prognosis:  Patient is somewhat fixated on his blood pressure and his truck- wanting to keep his independence- CSW  has assured him the SNF would be for rehab and short term with a long term plan of ALF if he agrees to this after SNF  (His monthly income would cover this).   Emotional Assessment Appearance:  Developmentally appropriate Attitude/Demeanor/Rapport:    Affect (typically observed):  Accepting, Appropriate Orientation:  Oriented to Self, Oriented to Place, Oriented to  Time, Oriented to Situation Alcohol / Substance use:  Not Applicable Psych involvement (Current and /or in the community):  No (Comment)  Discharge Needs  Concerns to be addressed:  Discharge Planning Concerns Readmission within the last 30 days:  No Current discharge risk:  Homeless Barriers to Discharge:  No Barriers Identified   Ludwig Clarks, LCSW 08/03/2015, 3:24 PM

## 2015-08-03 NOTE — Progress Notes (Signed)
[facility] PROGRESS NOTE  [name] [id number] DOB: Sep 03, 1933 DOA: 07/31/2015 PCP: Philis Fendt, MD  HPI/Brief narrative 81 -year-old male with past medical history of insulin-dependent diabetes, chronic diastolic CHF (last 2-D echo in April 2016 with grade 2 diastolic dysfunction and preserved ejection fraction), hypertension, lower extremity edema who presented to [facility] ED with reports of worsening redness, pain and swelling in the left lower extremity. He reports having chronic swelling in legs but definitely has gotten worse in past few days. Patient takes Lasix on an as needed basis for lower extremity swelling. He has not been compliant with medications as prescribed  Assessment/Plan: #1 left lower extremity cellulitis/venous stasis changes chronic/lymphedema -Left lower extremity with linear ulcerations and left calf tender to palpation.  -Lower extremity Dopplers negative for DVT.  -Wound care was consulted with dressing changes recommended. Patient currently remains on IV Rocephin.  #2 acute on chronic kidney disease stage III -Patient remains noncompliant with medications. Worsening renal function. Was given lasix recently. -Will give trial of gentle IVF  #3 uncontrolled hypertension -Likely secondary to medical noncompliance.  -Patient has been advised that he needs to be compliant with his blood pressure medications. -Continue current dose of Coreg, hydralazine, BiDil and uptitrate as needed. -Has been refusing bp meds while inpatient, again today. Patient understands there is high risk for CVA, continued renal failure, death if he continues to be noncompliant  #4 chronic diastolic heart failure -Stable. 2-D echo from April 2016 with grade 2 diastolic dysfunction. Continue aspirin and Coreg.  #5 uncontrolled diabetes mellitus with diabetic nephropathy with long-term use of insulin -Hemoglobin A1c was 8.28 October 2014.  -Patient with gross medical  noncompliance.  -CBGs have ranged from 121-166.  -Discontinue Amaryl. Continue Lantus.  -Continue sliding scale insulin.  -Diabetes coordinator following.  #6 thrombocytopenia -Patient with no evidence of bleeding.   Code Status: Full Family Communication: Pt in room Disposition Plan: Possible SNF when renal function improves   Consultants:    Procedures:    Antibiotics: Anti-infectives    Start     Dose/Rate Route Frequency Ordered Stop   08/01/15 1500  vancomycin (VANCOCIN) IVPB 1000 mg/200 mL premix  Status:  Discontinued     1,000 mg 200 mL/hr over 60 Minutes Intravenous Every 24 hours 07/31/15 1325 07/31/15 1336   08/01/15 1000  cefTRIAXone (ROCEPHIN) 2 g in dextrose 5 % 50 mL IVPB     2 g 100 mL/hr over 30 Minutes Intravenous Every 24 hours 08/01/15 0809     07/31/15 1400  clindamycin (CLEOCIN) IVPB 600 mg  Status:  Discontinued     600 mg 100 mL/hr over 30 Minutes Intravenous 3 times per day 07/31/15 1316 08/01/15 0808   07/31/15 1345  vancomycin (VANCOCIN) 2,000 mg in sodium chloride 0.9 % 500 mL IVPB  Status:  Discontinued     2,000 mg 250 mL/hr over 120 Minutes Intravenous  Once 07/31/15 1322 07/31/15 1336      HPI/Subjective: Refusing BP meds this AM  Objective: Filed Vitals:   08/02/15 2101 08/03/15 0430 08/03/15 1315 08/03/15 1455  BP:  155/59 216/60 165/55  Pulse: 60 74 55 62  Temp: 98.3 F (36.8 C) 99 F (37.2 C) 98.2 F (36.8 C)   TempSrc: Oral Oral Oral   Resp: 19 18 20    Height:      Weight:  111.2 kg (245 lb 2.4 oz)    SpO2: 99% 97% 98%     Intake/Output Summary (Last 24 hours) at 08/03/15  [place] filed at 08/03/15 1230  Gross per 24 hour  Intake    770 ml  Output    600 ml  Net    170 ml   Filed Weights   08/01/15 0434 08/02/15 0619 08/03/15 0430  Weight: 108.954 kg (240 lb 3.2 oz) 110.9 kg (244 lb 7.8 oz) 111.2 kg (245 lb 2.4 oz)    Exam:   General:  Awake, laying in bed, in nad  Cardiovascular: regular, s1,  s2  Respiratory: normal resp effort, no wheezing  Abdomen: soft,nondistended, pos BS  Musculoskeletal: perfused, no clubbing, BLE with dressings in place   Data Reviewed: Basic Metabolic Panel:  Recent Labs Lab 07/31/15 1000 07/31/15 1637 08/01/15 0510 08/02/15 0510 08/03/15 0435  NA 139 141 139 138 139  K 4.1 4.1 4.2 4.2 4.7  CL 104 107 107 107 109  CO2 28 29 25 23 24   GLUCOSE 161* 184* 129* 178* 137*  BUN 29* 30* 34* 51* 52*  CREATININE 2.03* 1.90* 2.43* 3.28* 2.85*  CALCIUM 8.6* 8.7* 8.2* 8.1* 8.4*   Liver Function Tests: No results for input(s): AST, ALT, ALKPHOS, BILITOT, PROT, ALBUMIN in the last 168 hours. No results for input(s): LIPASE, AMYLASE in the last 168 hours. No results for input(s): AMMONIA in the last 168 hours. CBC:  Recent Labs Lab 07/31/15 1000 07/31/15 1637 08/01/15 0510 08/02/15 0510 08/03/15 0435  WBC 6.5 7.2 6.8 5.7 6.7  NEUTROABS 3.5 4.1  --   --   --   HGB 10.7* 10.3* 8.5* 8.2* 8.2*  HCT 34.0* 33.3* 26.8* 26.2* 26.2*  MCV 87.6 88.1 87.6 88.5 86.8  PLT 115* 104* 100* 88* 88*   Cardiac Enzymes: No results for input(s): CKTOTAL, CKMB, CKMBINDEX, TROPONINI in the last 168 hours. BNP (last 3 results)  Recent Labs  10/20/14 1503 11/23/14 1551 08/02/15 0510  BNP 599.1* 978.4* 191.9*    ProBNP (last 3 results) No results for input(s): PROBNP in the last 8760 hours.  CBG:  Recent Labs Lab 08/02/15 1151 08/02/15 1711 08/02/15 2149 08/03/15 0807 08/03/15 1213  GLUCAP 138* 138* 168* 130* 144*    Recent Results (from the past 240 hour(s))  Wound culture     Status: None   Collection Time: 07/31/15 12:45 PM  Result Value Ref Range Status   Specimen Description LEG LEFT  Final   Special Requests NONE  Final   Gram Stain   Final    RARE WBC PRESENT, PREDOMINANTLY PMN NO SQUAMOUS EPITHELIAL CELLS SEEN ABUNDANT GRAM POSITIVE COCCI IN PAIRS Performed at [facility]    Culture   Final    ABUNDANT STAPHYLOCOCCUS  AUREUS Note: RIFAMPIN AND GENTAMICIN SHOULD NOT BE USED AS SINGLE DRUGS FOR TREATMENT OF STAPH INFECTIONS. Performed at [facility]    Report Status 08/03/2015 FINAL  Final   Organism ID, Bacteria STAPHYLOCOCCUS AUREUS  Final      Susceptibility   Staphylococcus aureus - MIC*    CLINDAMYCIN <=0.25 SENSITIVE Sensitive     ERYTHROMYCIN <=0.25 SENSITIVE Sensitive     GENTAMICIN <=0.5 SENSITIVE Sensitive     LEVOFLOXACIN <=0.12 SENSITIVE Sensitive     OXACILLIN 0.5 SENSITIVE Sensitive     RIFAMPIN <=0.5 SENSITIVE Sensitive     TRIMETH/SULFA <=10 SENSITIVE Sensitive     VANCOMYCIN 1 SENSITIVE Sensitive     TETRACYCLINE <=1 SENSITIVE Sensitive     MOXIFLOXACIN <=0.25 SENSITIVE Sensitive     * ABUNDANT STAPHYLOCOCCUS AUREUS  Blood Cultures x 2 sites  Status: None (Preliminary result)   Collection Time: 07/31/15  4:37 PM  Result Value Ref Range Status   Specimen Description   Final    BLOOD RIGHT ARM Performed at [place] Requests   Final    BOTTLES DRAWN AEROBIC AND ANAEROBIC 5CC Performed at [place]   Final    NO GROWTH 3 DAYS Performed at [facility]    Report Status PENDING  Incomplete  Blood Cultures x 2 sites     Status: None (Preliminary result)   Collection Time: 07/31/15  9:14 PM  Result Value Ref Range Status   Specimen Description BLOOD RIGHT ANTECUBITAL  Final   Special Requests BOTTLES DRAWN AEROBIC AND ANAEROBIC 10CC  Final   Culture   Final    NO GROWTH 2 DAYS Performed at [facility]    Report Status PENDING  Incomplete     Studies: No results found.  Scheduled Meds: . aspirin EC  81 mg Oral Daily  . carvedilol  12.5 mg Oral BID WC  . cefTRIAXone (ROCEPHIN)  IV  2 g Intravenous Q24H  . feeding supplement (PRO-STAT SUGAR FREE 64)  30 mL Oral BID  . ferrous sulfate  325 mg Oral BID WC  . hydrALAZINE  25 mg Oral 4 times per day  . insulin aspart  0-20 Units  Subcutaneous TID WC  . insulin aspart  0-5 Units Subcutaneous QHS  . insulin glargine  10 Units Subcutaneous QHS  . isosorbide-hydrALAZINE  1 tablet Oral TID  . polyethylene glycol  17 g Oral Daily  . sodium chloride  3 mL Intravenous Q12H   Continuous Infusions:   Principal Problem:   Cellulitis of left lower extremity Active Problems:   Chronic acquired lymphedema   Acute renal failure superimposed on stage 3 chronic kidney disease (HCC)   Chronic diastolic CHF (congestive heart failure) (HCC)   Anemia of chronic renal failure, stage 3 (moderate)   Uncontrolled diabetes mellitus with diabetic nephropathy, with long-term current use of insulin ([place])   Accelerated hypertension   Thrombocytopenia ([place])   [facility], [place] [facility] Pager [phone]. If 7PM-7AM, please contact night-coverage at [email or web address], password [facility] 08/03/2015, 4:57 PM  LOS: 3 days

## 2015-08-03 NOTE — Progress Notes (Signed)
Patient agrees to SNF bed at [facility] at dc- not medically ready today. CSW to follow up tomorrow for further [place], MSW, [place]

## 2015-08-03 NOTE — Progress Notes (Signed)
Pt refused to take any blood pressure medication. Pt states" I feel  bad and all I want to do is sleep when my blood pressure is lower than 150." I try to keep my blood pressure around 200.  Writer educated pt on importance of keeping blood pressure lower than 150 . Pt verbalized understanding and still refused to take mediation.

## 2015-08-03 NOTE — Care Management Important Message (Signed)
Important Message  Patient Details  Name: [name] MRN: [id number] Date of Birth: 03-Jan-1934   Medicare Important Message Given:  Yes    Camillo Flaming 08/03/2015, 11:55 AMImportant Message  Patient Details  Name: [name] MRN: [id number] Date of Birth: April 06, 1934   Medicare Important Message Given:  Yes    Camillo Flaming 08/03/2015, 11:55 AM

## 2015-08-04 DIAGNOSIS — Z9114 Patient's other noncompliance with medication regimen: Secondary | ICD-10-CM

## 2015-08-04 DIAGNOSIS — I5032 Chronic diastolic (congestive) heart failure: Secondary | ICD-10-CM

## 2015-08-04 LAB — BASIC METABOLIC PANEL
Anion gap: 7 (ref 5–15)
BUN: 50 mg/dL — AB (ref 6–20)
CALCIUM: 8.4 mg/dL — AB (ref 8.9–10.3)
CO2: 22 mmol/L (ref 22–32)
Chloride: 108 mmol/L (ref 101–111)
Creatinine, Ser: 2.6 mg/dL — ABNORMAL HIGH (ref 0.61–1.24)
GFR calc Af Amer: 25 mL/min — ABNORMAL LOW (ref >60)
GFR, EST NON AFRICAN AMERICAN: 22 mL/min — AB (ref >60)
GLUCOSE: 125 mg/dL — AB (ref 65–99)
Potassium: 4.3 mmol/L (ref 3.5–5.1)
SODIUM: 137 mmol/L (ref 135–145)

## 2015-08-04 LAB — GLUCOSE, CAPILLARY
GLUCOSE-CAPILLARY: 125 mg/dL — AB (ref 65–99)
GLUCOSE-CAPILLARY: 161 mg/dL — AB (ref 65–99)
Glucose-Capillary: 147 mg/dL — ABNORMAL HIGH (ref 65–99)

## 2015-08-04 LAB — CBC
HCT: 26.8 % — ABNORMAL LOW (ref 39.0–52.0)
Hemoglobin: 8.5 g/dL — ABNORMAL LOW (ref 13.0–17.0)
MCH: 27.6 pg (ref 26.0–34.0)
MCHC: 31.7 g/dL (ref 30.0–36.0)
MCV: 87 fL (ref 78.0–100.0)
PLATELETS: 95 10*3/uL — AB (ref 150–400)
RBC: 3.08 MIL/uL — ABNORMAL LOW (ref 4.22–5.81)
RDW: 15.2 % (ref 11.5–15.5)
WBC: 7.3 10*3/uL (ref 4.0–10.5)

## 2015-08-04 MED ORDER — CEPHALEXIN 500 MG PO CAPS
500.0000 mg | ORAL_CAPSULE | Freq: Two times a day (BID) | ORAL | Status: DC
  Administered 2015-08-04 – 2015-08-05 (×2): 500 mg via ORAL
  Filled 2015-08-04 (×3): qty 1

## 2015-08-04 MED ORDER — SODIUM CHLORIDE 0.9 % IV SOLN
INTRAVENOUS | Status: DC
  Administered 2015-08-04 (×2): via INTRAVENOUS

## 2015-08-04 NOTE — Progress Notes (Signed)
[facility] PROGRESS NOTE  [name] [id number] DOB: [birth year] DOA: 07/31/2015 PCP: Philis Fendt, MD  HPI/Brief narrative 81 -year-old male with past medical history of insulin-dependent diabetes, chronic diastolic CHF (last 2-D echo in April 2016 with grade 2 diastolic dysfunction and preserved ejection fraction), hypertension, lower extremity edema who presented to [facility] ED with reports of worsening redness, pain and swelling in the left lower extremity. He reports having chronic swelling in legs but definitely has gotten worse in past few days. Patient takes Lasix on an as needed basis for lower extremity swelling. He has not been compliant with medications as prescribed  Assessment/Plan: #1 left lower extremity cellulitis/venous stasis changes chronic/lymphedema -Left lower extremity with linear ulcerations and left calf tender to palpation.  -Lower extremity Dopplers negative for DVT.  -Wound care was consulted with dressing changes recommended. Patient was initially continued on IV Rocephin. -Wound cx with MSSA. Transition to PO keflex  #2 acute on chronic kidney disease stage III -Patient remains noncompliant with medications. Worsening renal function. Was given lasix recently. -Renal function improving with gentle IVF hydration  #3 uncontrolled hypertension -Likely secondary to medical noncompliance.  -Patient has been advised that he needs to be compliant with his blood pressure medications. -Continue current dose of Coreg, hydralazine, BiDil and uptitrate as needed. -Has been refusing bp meds while inpatient, claiming he feels better with uncontrolled BP. Patient understands there is high risk for CVA, continued renal failure, death if he continues to be noncompliant and with uncontrolled BP  #4 chronic diastolic heart failure -Stable. 2-D echo from April 2016 with grade 2 diastolic dysfunction. Continue aspirin and Coreg.  #5 uncontrolled diabetes  mellitus with diabetic nephropathy with long-term use of insulin -Hemoglobin A1c was 8.28 October 2014.  -Patient with gross medical noncompliance.  -CBGs have ranged from 121-166.  -Discontinue Amaryl. Continue Lantus.  -Continue sliding scale insulin.  -Diabetes coordinator following.  #6 thrombocytopenia -Patient with no evidence of bleeding.   Code Status: Full Family Communication: Pt in room Disposition Plan: Plan SNF when renal function further improves improves   Consultants:    Procedures:    Antibiotics: Anti-infectives    Start     Dose/Rate Route Frequency Ordered Stop   08/04/15 2200  cephALEXin (KEFLEX) capsule 500 mg     500 mg Oral Every 12 hours 08/04/15 1016     08/01/15 1500  vancomycin (VANCOCIN) IVPB 1000 mg/200 mL premix  Status:  Discontinued     1,000 mg 200 mL/hr over 60 Minutes Intravenous Every 24 hours 07/31/15 1325 07/31/15 1336   08/01/15 1000  cefTRIAXone (ROCEPHIN) 2 g in dextrose 5 % 50 mL IVPB  Status:  Discontinued     2 g 100 mL/hr over 30 Minutes Intravenous Every 24 hours 08/01/15 0809 08/04/15 1016   07/31/15 1400  clindamycin (CLEOCIN) IVPB 600 mg  Status:  Discontinued     600 mg 100 mL/hr over 30 Minutes Intravenous 3 times per day 07/31/15 1316 08/01/15 0808   07/31/15 1345  vancomycin (VANCOCIN) 2,000 mg in sodium chloride 0.9 % 500 mL IVPB  Status:  Discontinued     2,000 mg 250 mL/hr over 120 Minutes Intravenous  Once 07/31/15 1322 07/31/15 1336      HPI/Subjective: No complaints this AM. Pt upset he took his meds this AM  Objective: Filed Vitals:   08/03/15 1455 08/03/15 2117 08/04/15 0536 08/04/15 0629  BP: 165/55 187/56 183/62 180/57  Pulse: 62 49 50 44  Temp:  98.5  F (36.9 C) 98.7 F (37.1 C)   TempSrc:  Oral Oral   Resp:  20 22   Height:      Weight:   113 kg (249 lb 1.9 oz)   SpO2:  97% 98%     Intake/Output Summary (Last 24 hours) at 08/04/15 1153 Last data filed at 08/04/15 0847  Gross per 24 hour   Intake    480 ml  Output   1300 ml  Net   -820 ml   Filed Weights   08/02/15 0619 08/03/15 0430 08/04/15 0536  Weight: 110.9 kg (244 lb 7.8 oz) 111.2 kg (245 lb 2.4 oz) 113 kg (249 lb 1.9 oz)    Exam:   General:  Awake, laying in bed reading menu, in nad  Cardiovascular: regular, s1, s2  Respiratory: normal resp effort, no wheezing  Abdomen: soft,nondistended, pos BS  Musculoskeletal: perfused, no clubbing, BLE with dressings in place   Data Reviewed: Basic Metabolic Panel:  Recent Labs Lab 07/31/15 1637 08/01/15 0510 08/02/15 0510 08/03/15 0435 08/04/15 0505  NA 141 139 138 139 137  K 4.1 4.2 4.2 4.7 4.3  CL 107 107 107 109 108  CO2 29 25 23 24 22   GLUCOSE 184* 129* 178* 137* 125*  BUN 30* 34* 51* 52* 50*  CREATININE 1.90* 2.43* 3.28* 2.85* 2.60*  CALCIUM 8.7* 8.2* 8.1* 8.4* 8.4*   Liver Function Tests: No results for input(s): AST, ALT, ALKPHOS, BILITOT, PROT, ALBUMIN in the last 168 hours. No results for input(s): LIPASE, AMYLASE in the last 168 hours. No results for input(s): AMMONIA in the last 168 hours. CBC:  Recent Labs Lab 07/31/15 1000 07/31/15 1637 08/01/15 0510 08/02/15 0510 08/03/15 0435 08/04/15 0505  WBC 6.5 7.2 6.8 5.7 6.7 7.3  NEUTROABS 3.5 4.1  --   --   --   --   HGB 10.7* 10.3* 8.5* 8.2* 8.2* 8.5*  HCT 34.0* 33.3* 26.8* 26.2* 26.2* 26.8*  MCV 87.6 88.1 87.6 88.5 86.8 87.0  PLT 115* 104* 100* 88* 88* 95*   Cardiac Enzymes: No results for input(s): CKTOTAL, CKMB, CKMBINDEX, TROPONINI in the last 168 hours. BNP (last 3 results)  Recent Labs  10/20/14 1503 11/23/14 1551 08/02/15 0510  BNP 599.1* 978.4* 191.9*    ProBNP (last 3 results) No results for input(s): PROBNP in the last 8760 hours.  CBG:  Recent Labs Lab 08/03/15 0807 08/03/15 1213 08/03/15 1709 08/03/15 2110 08/04/15 0805  GLUCAP 130* 144* 127* 201* 125*    Recent Results (from the past 240 hour(s))  Wound culture     Status: None   Collection Time:  07/31/15 12:45 PM  Result Value Ref Range Status   Specimen Description LEG LEFT  Final   Special Requests NONE  Final   Gram Stain   Final    RARE WBC PRESENT, PREDOMINANTLY PMN NO SQUAMOUS EPITHELIAL CELLS SEEN ABUNDANT GRAM POSITIVE COCCI IN PAIRS Performed at [facility]    Culture   Final    ABUNDANT STAPHYLOCOCCUS AUREUS Note: RIFAMPIN AND GENTAMICIN SHOULD NOT BE USED AS SINGLE DRUGS FOR TREATMENT OF STAPH INFECTIONS. Performed at [facility]    Report Status 08/03/2015 FINAL  Final   Organism ID, Bacteria STAPHYLOCOCCUS AUREUS  Final      Susceptibility   Staphylococcus aureus - MIC*    CLINDAMYCIN <=0.25 SENSITIVE Sensitive     ERYTHROMYCIN <=0.25 SENSITIVE Sensitive     GENTAMICIN <=0.5 SENSITIVE Sensitive     LEVOFLOXACIN <=0.12 SENSITIVE  Sensitive     OXACILLIN 0.5 SENSITIVE Sensitive     RIFAMPIN <=0.5 SENSITIVE Sensitive     TRIMETH/SULFA <=10 SENSITIVE Sensitive     VANCOMYCIN 1 SENSITIVE Sensitive     TETRACYCLINE <=1 SENSITIVE Sensitive     MOXIFLOXACIN <=0.25 SENSITIVE Sensitive     * ABUNDANT STAPHYLOCOCCUS AUREUS  Blood Cultures x 2 sites     Status: None (Preliminary result)   Collection Time: 07/31/15  4:37 PM  Result Value Ref Range Status   Specimen Description   Final    BLOOD RIGHT ARM Performed at [place] Requests   Final    BOTTLES DRAWN AEROBIC AND ANAEROBIC 5CC Performed at [place]   Final    NO GROWTH 3 DAYS Performed at [facility]    Report Status PENDING  Incomplete  Blood Cultures x 2 sites     Status: None (Preliminary result)   Collection Time: 07/31/15  9:14 PM  Result Value Ref Range Status   Specimen Description BLOOD RIGHT ANTECUBITAL  Final   Special Requests BOTTLES DRAWN AEROBIC AND ANAEROBIC 10CC  Final   Culture   Final    NO GROWTH 2 DAYS Performed at [facility]    Report Status PENDING  Incomplete      Studies: No results found.  Scheduled Meds: . aspirin EC  81 mg Oral Daily  . carvedilol  12.5 mg Oral BID WC  . cephALEXin  500 mg Oral Q12H  . feeding supplement (PRO-STAT SUGAR FREE 64)  30 mL Oral BID  . ferrous sulfate  325 mg Oral BID WC  . hydrALAZINE  25 mg Oral 4 times per day  . insulin aspart  0-20 Units Subcutaneous TID WC  . insulin aspart  0-5 Units Subcutaneous QHS  . insulin glargine  10 Units Subcutaneous QHS  . isosorbide-hydrALAZINE  1 tablet Oral TID  . polyethylene glycol  17 g Oral Daily  . sodium chloride  3 mL Intravenous Q12H   Continuous Infusions: . sodium chloride 75 mL/hr at 08/04/15 [id number]    Principal Problem:   Cellulitis of left lower extremity Active Problems:   Chronic acquired lymphedema   Acute renal failure superimposed on stage 3 chronic kidney disease (HCC)   Chronic diastolic CHF (congestive heart failure) (HCC)   Anemia of chronic renal failure, stage 3 (moderate)   Uncontrolled diabetes mellitus with diabetic nephropathy, with long-term current use of insulin ([place])   Accelerated hypertension   Thrombocytopenia ([place])   Noncompliance with medications   Nahzir Pohle, [place] [facility] Pager [phone]. If 7PM-7AM, please contact night-coverage at [email or web address], password [facility] 08/04/2015, 11:53 AM  LOS: 4 days

## 2015-08-04 NOTE — Clinical Social Work Note (Signed)
Pt is not medically stable for discharge today.  CSW informed staff at [employer].  CSW will continue to follow and assist with d/c planning needs.  [place], [place]

## 2015-08-05 LAB — BASIC METABOLIC PANEL
Anion gap: 5 (ref 5–15)
BUN: 47 mg/dL — ABNORMAL HIGH (ref 6–20)
CHLORIDE: 106 mmol/L (ref 101–111)
CO2: 22 mmol/L (ref 22–32)
CREATININE: 2.45 mg/dL — AB (ref 0.61–1.24)
Calcium: 8.2 mg/dL — ABNORMAL LOW (ref 8.9–10.3)
GFR calc non Af Amer: 24 mL/min — ABNORMAL LOW (ref >60)
GFR, EST AFRICAN AMERICAN: 27 mL/min — AB (ref >60)
Glucose, Bld: 112 mg/dL — ABNORMAL HIGH (ref 65–99)
Potassium: 4.2 mmol/L (ref 3.5–5.1)
Sodium: 133 mmol/L — ABNORMAL LOW (ref 135–145)

## 2015-08-05 LAB — CULTURE, BLOOD (ROUTINE X 2): Culture: NO GROWTH

## 2015-08-05 LAB — GLUCOSE, CAPILLARY
Glucose-Capillary: 104 mg/dL — ABNORMAL HIGH (ref 65–99)
Glucose-Capillary: 144 mg/dL — ABNORMAL HIGH (ref 65–99)

## 2015-08-05 MED ORDER — HYDRALAZINE HCL 25 MG PO TABS
25.0000 mg | ORAL_TABLET | Freq: Four times a day (QID) | ORAL | Status: DC
Start: 2015-08-05 — End: 2015-08-28

## 2015-08-05 MED ORDER — CEPHALEXIN 500 MG PO CAPS: 500.0000 mg | ORAL_CAPSULE | Freq: Two times a day (BID) | ORAL | Status: DC

## 2015-08-05 MED ORDER — ISOSORB DINITRATE-HYDRALAZINE 20-37.5 MG PO TABS: 1.0000 | ORAL_TABLET | Freq: Three times a day (TID) | ORAL | Status: DC

## 2015-08-05 NOTE — Discharge Summary (Signed)
Physician Discharge Summary  [name] [id number] DOB: Feb 22, 1934 DOA: 07/31/2015  PCP: Philis Fendt, MD  Admit date: 07/31/2015 Discharge date: 08/05/2015  Time spent: 20 minutes  Recommendations for Outpatient Follow-up:  1. Follow up with PCP in 1-2 weeks  2. Wound Care Recs: "dry boot" application (Kerlix topped with ACE bandage). This will be changed twice daily so that we can monitor his response to antibiotics 3. 3 more days of abx remain post-discharge  Discharge Diagnoses:  Principal Problem:   Cellulitis of left lower extremity Active Problems:   Chronic acquired lymphedema   Acute renal failure superimposed on stage 3 chronic kidney disease (HCC)   Chronic diastolic CHF (congestive heart failure) (HCC)   Anemia of chronic renal failure, stage 3 (moderate)   Uncontrolled diabetes mellitus with diabetic nephropathy, with long-term current use of insulin (HCC)   Accelerated hypertension   Thrombocytopenia ([place])   Noncompliance with medications   Discharge Condition: Improved  Diet recommendation: Diabetic, heart healthy  Filed Weights   08/03/15 0430 08/04/15 0536 08/05/15 0524  Weight: 111.2 kg (245 lb 2.4 oz) 113 kg (249 lb 1.9 oz) 112.9 kg (248 lb 14.4 oz)    History of present illness:  Please review dictated H and P from 12/5 for details. Briefly, 81 -year-old male with past medical history of insulin-dependent diabetes, chronic diastolic CHF (last 2-D echo in April 2016 with grade 2 diastolic dysfunction and preserved ejection fraction), hypertension, lower extremity edema who presented to [facility] ED with reports of worsening redness, pain and swelling in the left lower extremity. He reports having chronic swelling in legs but definitely has gotten worse in past few days. Patient takes Lasix on an as needed basis for lower extremity swelling. He has not been compliant with medications as prescribed.  Hospital Course:  #1 left lower extremity  cellulitis/venous stasis changes chronic/lymphedema -Left lower extremity with linear ulcerations and left calf tender to palpation -Lower extremity Dopplers negative for DVT.  -Wound care was consulted with dressing changes recommended. Patient was initially continued on IV Rocephin. -Wound cx pos for MSSA. Transitioned to PO keflex to complete course -LLE with open sore anteriorly with active bleeding (good perfusion) and granulation. Signs of healing  #2 acute on chronic kidney disease stage III -Patient remains noncompliant with medications. Worsening renal function. Was given lasix recently. -Renal function improving with gentle IVF hydration  #3 uncontrolled hypertension -Likely secondary to medical noncompliance.  -Patient has been advised that he needs to be compliant with his blood pressure medications. -Continue current dose of Coreg, hydralazine, BiDil and uptitrate as needed. -Has been refusing bp meds while inpatient, claiming he feels better with uncontrolled BP. Patient understands there is high risk for CVA, continued renal failure, death if he continues to be noncompliant and with uncontrolled BP  #4 chronic diastolic heart failure -Stable. 2-D echo from April 2016 with grade 2 diastolic dysfunction. Continued aspirin and Coreg.  #5 uncontrolled diabetes mellitus with diabetic nephropathy with long-term use of insulin -Hemoglobin A1c was 8.28 October 2014.  -Patient with gross medical noncompliance.  -CBGs have ranged from 121-166.  -Discontinued Amaryl while inpatient. Continue Lantus.  -Continue sliding scale insulin while inpatient -Diabetes coordinator following.  #6 thrombocytopenia -Patient with no evidence of bleeding.    Discharge Exam: Filed Vitals:   08/04/15 1623 08/04/15 2126 08/05/15 0030 08/05/15 0524  BP: 153/57 172/50 143/59 183/60  Pulse: 82 59  47  Temp: 98.4 F (36.9 C) 98.5 F (36.9 C)  98.4  F (36.9 C)  TempSrc: Oral Oral  Oral  Resp:  20 20  20   Height:      Weight:    112.9 kg (248 lb 14.4 oz)  SpO2: 100% 98%  97%    General: Awake, in nad Cardiovascular: regular, s1, s2 Respiratory: normal resp effort, no wheezing  Discharge Instructions     Medication List    STOP taking these medications        losartan 50 MG tablet  Commonly known as:  COZAAR     telmisartan 40 MG tablet  Commonly known as:  MICARDIS      TAKE these medications        aspirin 81 MG tablet  Take 1 tablet (81 mg total) by mouth daily.     bisacodyl 10 MG suppository  Commonly known as:  DULCOLAX  Place 1 suppository (10 mg total) rectally as needed for moderate constipation.     carvedilol 12.5 MG tablet  Commonly known as:  COREG  Take 12.5 mg by mouth 2 (two) times daily as needed (For heart.).     cephALEXin 500 MG capsule  Commonly known as:  KEFLEX  Take 1 capsule (500 mg total) by mouth every 12 (twelve) hours.     ferrous sulfate 325 (65 FE) MG tablet  Take 1 tablet (325 mg total) by mouth 2 (two) times daily with a meal.     furosemide 40 MG tablet  Commonly known as:  LASIX  Take 1 tablet (40 mg total) by mouth daily.     glimepiride 2 MG tablet  Commonly known as:  AMARYL  Take 1 tablet (2 mg total) by mouth daily with breakfast.     hydrALAZINE 25 MG tablet  Commonly known as:  APRESOLINE  Take 1 tablet (25 mg total) by mouth every 6 (six) hours.     insulin aspart 100 UNIT/ML injection  Commonly known as:  novoLOG  Inject 0-15 Units into the skin 3 (three) times daily with meals.     insulin glargine 100 UNIT/ML injection  Commonly known as:  LANTUS  Inject 0.1 mLs (10 Units total) into the skin at bedtime.     isosorbide-hydrALAZINE 20-37.5 MG tablet  Commonly known as:  BIDIL  Take 1 tablet by mouth 3 (three) times daily.     levalbuterol 0.63 MG/3ML nebulizer solution  Commonly known as:  XOPENEX  Take 3 mLs (0.63 mg total) by nebulization every 6 (six) hours as needed for wheezing or  shortness of breath.     polyethylene glycol packet  Commonly known as:  MIRALAX  Take 17 g by mouth daily.       No Known Allergies    The results of significant diagnostics from this hospitalization (including imaging, microbiology, ancillary and laboratory) are listed below for reference.    Significant Diagnostic Studies: Dg Tibia/fibula Left  07/31/2015  CLINICAL DATA:  Oozing wound in the distal third of the left tib/ fib region for 1 month. EXAM: LEFT TIBIA AND FIBULA - 2 VIEW COMPARISON:  MRI 01/20/2011 FINDINGS: Remote proximal tibial and fibular diaphysis fracture status post ORIF at the tibia. Fractures are healed. There is diffuse subcutaneous reticulation, greatest at the calf. No soft tissue gas or opaque foreign body (when accounting for bandage). No evidence of osteomyelitis. Knee and tibiotalar osteoarthritis with joint narrowing and spurring. Osteopenia. IMPRESSION: Diffuse leg swelling without soft tissue gas or acute osseous finding. Electronically Signed   By: Neva Seat.D.  On: 07/31/2015 12:07    Microbiology: Recent Results (from the past 240 hour(s))  Wound culture     Status: None   Collection Time: 07/31/15 12:45 PM  Result Value Ref Range Status   Specimen Description LEG LEFT  Final   Special Requests NONE  Final   Gram Stain   Final    RARE WBC PRESENT, PREDOMINANTLY PMN NO SQUAMOUS EPITHELIAL CELLS SEEN ABUNDANT GRAM POSITIVE COCCI IN PAIRS Performed at [facility]    Culture   Final    ABUNDANT STAPHYLOCOCCUS AUREUS Note: RIFAMPIN AND GENTAMICIN SHOULD NOT BE USED AS SINGLE DRUGS FOR TREATMENT OF STAPH INFECTIONS. Performed at [facility]    Report Status 08/03/2015 FINAL  Final   Organism ID, Bacteria STAPHYLOCOCCUS AUREUS  Final      Susceptibility   Staphylococcus aureus - MIC*    CLINDAMYCIN <=0.25 SENSITIVE Sensitive     ERYTHROMYCIN <=0.25 SENSITIVE Sensitive     GENTAMICIN <=0.5 SENSITIVE Sensitive      LEVOFLOXACIN <=0.12 SENSITIVE Sensitive     OXACILLIN 0.5 SENSITIVE Sensitive     RIFAMPIN <=0.5 SENSITIVE Sensitive     TRIMETH/SULFA <=10 SENSITIVE Sensitive     VANCOMYCIN 1 SENSITIVE Sensitive     TETRACYCLINE <=1 SENSITIVE Sensitive     MOXIFLOXACIN <=0.25 SENSITIVE Sensitive     * ABUNDANT STAPHYLOCOCCUS AUREUS  Blood Cultures x 2 sites     Status: None (Preliminary result)   Collection Time: 07/31/15  4:37 PM  Result Value Ref Range Status   Specimen Description   Final    BLOOD RIGHT ARM Performed at [place] [facility] Requests   Final    BOTTLES DRAWN AEROBIC AND ANAEROBIC 5CC Performed at [place]   Final    NO GROWTH 4 DAYS Performed at [facility]    Report Status PENDING  Incomplete  Blood Cultures x 2 sites     Status: None (Preliminary result)   Collection Time: 07/31/15  9:14 PM  Result Value Ref Range Status   Specimen Description BLOOD RIGHT ANTECUBITAL  Final   Special Requests BOTTLES DRAWN AEROBIC AND ANAEROBIC 10CC  Final   Culture   Final    NO GROWTH 3 DAYS Performed at [facility]    Report Status PENDING  Incomplete     Labs: Basic Metabolic Panel:  Recent Labs Lab 08/01/15 0510 08/02/15 0510 08/03/15 0435 08/04/15 0505 08/05/15 0508  NA 139 138 139 137 133*  K 4.2 4.2 4.7 4.3 4.2  CL 107 107 109 108 106  CO2 25 23 24 22 22   GLUCOSE 129* 178* 137* 125* 112*  BUN 34* 51* 52* 50* 47*  CREATININE 2.43* 3.28* 2.85* 2.60* 2.45*  CALCIUM 8.2* 8.1* 8.4* 8.4* 8.2*   Liver Function Tests: No results for input(s): AST, ALT, ALKPHOS, BILITOT, PROT, ALBUMIN in the last 168 hours. No results for input(s): LIPASE, AMYLASE in the last 168 hours. No results for input(s): AMMONIA in the last 168 hours. CBC:  Recent Labs Lab 07/31/15 1000 07/31/15 1637 08/01/15 0510 08/02/15 0510 08/03/15 0435 08/04/15 0505  WBC 6.5 7.2 6.8 5.7 6.7 7.3  NEUTROABS 3.5 4.1  --   --   --    --   HGB 10.7* 10.3* 8.5* 8.2* 8.2* 8.5*  HCT 34.0* 33.3* 26.8* 26.2* 26.2* 26.8*  MCV 87.6 88.1 87.6 88.5 86.8 87.0  PLT 115* 104* 100* 88* 88* 95*  Cardiac Enzymes: No results for input(s): CKTOTAL, CKMB, CKMBINDEX, TROPONINI in the last 168 hours. BNP: BNP (last 3 results)  Recent Labs  10/20/14 1503 11/23/14 1551 08/02/15 0510  BNP 599.1* 978.4* 191.9*    ProBNP (last 3 results) No results for input(s): PROBNP in the last 8760 hours.  CBG:  Recent Labs Lab 08/03/15 2110 08/04/15 0805 08/04/15 1141 08/04/15 1708 08/05/15 0724  GLUCAP 201* 125* 147* 161* 104*    Signed:  Cono Gebhard K  [facility] 08/05/2015, 11:18 AM

## 2015-08-05 NOTE — Care Management Note (Signed)
Case Management Note  Patient Details  Name: [name] MRN: [id number] Date of Birth: 21-Dec-1933  Subjective/Objective:     Cellulitis of left lower extremity, CHF               Action/Plan: Scheduled dc to SNF. CSW following for SNF placement.   Expected Discharge Date:  08/05/2015              Expected Discharge Plan:  Skilled Nursing Facility  In-House Referral:  Clinical Social Work  Discharge planning Services  CM Consult  Post Acute Care Choice:  NA Choice offered to:  NA  DME Arranged:  N/A DME Agency:  NA  HH Arranged:  NA HH Agency:  NA  Status of Service:  Completed, signed off  Medicare Important Message Given:  Yes Date Medicare IM Given:    Medicare IM give by:    Date Additional Medicare IM Given:    Additional Medicare Important Message give by:     If discussed at [place] of Stay Meetings, dates discussed:    Additional Comments:  Erenest Rasher, RN 08/05/2015, 1:23 PM

## 2015-08-05 NOTE — Progress Notes (Signed)
Pt to be discharged to [facility] care this afternoon. Report called to facility Satira Mccallum RN accepting report for facility.  Have tried to reach Pt's daughter to notify her of Pt's discharge but there is no answer on the phone number given on the chart. Staff also tried Pt's own cell phone which Daughter is in possession of and there is no answer. Updated Pt on the inability to reach Daughter. Pt's assessment unchanged at time of transfer and VSS.

## 2015-08-05 NOTE — Progress Notes (Signed)
Pt has been accepted at [facility] today.  CSW to facilitate tx.  Creta Levin, LCSW (Coverage) [phone]

## 2015-08-05 NOTE — Progress Notes (Signed)
CSW has arranged for [facility] to transport pt to [facility].  Creta Levin, LCSW (Coverage) [phone]

## 2015-08-05 NOTE — Clinical Social Work Placement (Signed)
   CLINICAL SOCIAL WORK PLACEMENT  NOTE  Date:  08/05/2015  Patient Details  Name: [name] MRN: [id number] Date of Birth: 1934/04/10  Clinical Social Work is seeking post-discharge placement for this patient at the [place] level of care (*CSW will initial, date and re-position this form in  chart as items are completed):  Yes   Patient/family provided with [place] Work Department's list of facilities offering this level of care within the geographic area requested by the patient (or if unable, by the patient's family).  Yes   Patient/family informed of their freedom to choose among providers that offer the needed level of care, that participate in Medicare, Medicaid or managed care program needed by the patient, have an available bed and are willing to accept the patient.  Yes   Patient/family informed of [facility]'s ownership interest in [facility] and [facility], as well as of the fact that they are under no obligation to receive care at these facilities.  PASRR submitted to EDS on       PASRR number received on       Existing PASRR number confirmed on 08/03/15     FL2 transmitted to all facilities in geographic area requested by pt/family on 08/02/15     FL2 transmitted to all facilities within larger geographic area on       Patient informed that his/her managed care company has contracts with or will negotiate with certain facilities, including the following:        Yes   Patient/family informed of bed offers received.  Patient chooses bed at [facility]     Physician recommends and patient chooses bed at      Patient to be transferred to  ([facility]) on 08/04/15.  Patient to be transferred to facility by  Corey Harold)     Patient family notified on   of transfer.08/04/09, day of transfer, to [facility]  Name of family member notified:     Attempted to reach daughter, phone not working  PHYSICIAN Please prepare  priority discharge summary, including medications, Please sign FL2, Please prepare prescriptions     Additional Comment:    _______________________________________________ Roanna Raider, LCSW 08/05/2015, 1:27 PM

## 2015-08-06 LAB — CULTURE, BLOOD (ROUTINE X 2): Culture: NO GROWTH

## 2015-08-28 ENCOUNTER — Emergency Department (HOSPITAL_COMMUNITY): Payer: Medicare Other

## 2015-08-28 ENCOUNTER — Inpatient Hospital Stay (HOSPITAL_COMMUNITY)
Admission: EM | Admit: 2015-08-28 | Discharge: 2015-09-02 | DRG: 291 | Disposition: A | Discharge status: Home / Self Care | Payer: Medicare Other | Attending: Internal Medicine | Admitting: Internal Medicine

## 2015-08-28 ENCOUNTER — Encounter (HOSPITAL_COMMUNITY): Payer: Self-pay | Admitting: *Deleted

## 2015-08-28 DIAGNOSIS — D631 Anemia in chronic kidney disease: Secondary | ICD-10-CM | POA: Diagnosis present

## 2015-08-28 DIAGNOSIS — I5033 Acute on chronic diastolic (congestive) heart failure: Secondary | ICD-10-CM | POA: Diagnosis not present

## 2015-08-28 DIAGNOSIS — L03116 Cellulitis of left lower limb: Secondary | ICD-10-CM | POA: Diagnosis not present

## 2015-08-28 DIAGNOSIS — I480 Paroxysmal atrial fibrillation: Secondary | ICD-10-CM | POA: Diagnosis present

## 2015-08-28 DIAGNOSIS — J9600 Acute respiratory failure, unspecified whether with hypoxia or hypercapnia: Secondary | ICD-10-CM | POA: Diagnosis present

## 2015-08-28 DIAGNOSIS — I2510 Atherosclerotic heart disease of native coronary artery without angina pectoris: Secondary | ICD-10-CM | POA: Diagnosis present

## 2015-08-28 DIAGNOSIS — Z87891 Personal history of nicotine dependence: Secondary | ICD-10-CM

## 2015-08-28 DIAGNOSIS — J180 Bronchopneumonia, unspecified organism: Secondary | ICD-10-CM | POA: Diagnosis present

## 2015-08-28 DIAGNOSIS — J9601 Acute respiratory failure with hypoxia: Secondary | ICD-10-CM | POA: Diagnosis present

## 2015-08-28 DIAGNOSIS — R0902 Hypoxemia: Secondary | ICD-10-CM | POA: Insufficient documentation

## 2015-08-28 DIAGNOSIS — Z7984 Long term (current) use of oral hypoglycemic drugs: Secondary | ICD-10-CM

## 2015-08-28 DIAGNOSIS — I4892 Unspecified atrial flutter: Secondary | ICD-10-CM | POA: Diagnosis present

## 2015-08-28 DIAGNOSIS — E119 Type 2 diabetes mellitus without complications: Secondary | ICD-10-CM | POA: Diagnosis not present

## 2015-08-28 DIAGNOSIS — I160 Hypertensive urgency: Secondary | ICD-10-CM | POA: Diagnosis present

## 2015-08-28 DIAGNOSIS — N184 Chronic kidney disease, stage 4 (severe): Secondary | ICD-10-CM | POA: Diagnosis present

## 2015-08-28 DIAGNOSIS — E1121 Type 2 diabetes mellitus with diabetic nephropathy: Secondary | ICD-10-CM | POA: Diagnosis present

## 2015-08-28 DIAGNOSIS — I509 Heart failure, unspecified: Secondary | ICD-10-CM

## 2015-08-28 DIAGNOSIS — E118 Type 2 diabetes mellitus with unspecified complications: Secondary | ICD-10-CM

## 2015-08-28 DIAGNOSIS — Z6836 Body mass index (BMI) 36.0-36.9, adult: Secondary | ICD-10-CM

## 2015-08-28 DIAGNOSIS — Z833 Family history of diabetes mellitus: Secondary | ICD-10-CM

## 2015-08-28 DIAGNOSIS — R042 Hemoptysis: Secondary | ICD-10-CM

## 2015-08-28 DIAGNOSIS — Z808 Family history of malignant neoplasm of other organs or systems: Secondary | ICD-10-CM

## 2015-08-28 DIAGNOSIS — Z9119 Patient's noncompliance with other medical treatment and regimen: Secondary | ICD-10-CM

## 2015-08-28 DIAGNOSIS — E1165 Type 2 diabetes mellitus with hyperglycemia: Secondary | ICD-10-CM | POA: Diagnosis present

## 2015-08-28 DIAGNOSIS — IMO0002 Reserved for concepts with insufficient information to code with codable children: Secondary | ICD-10-CM

## 2015-08-28 DIAGNOSIS — I4891 Unspecified atrial fibrillation: Secondary | ICD-10-CM | POA: Diagnosis present

## 2015-08-28 DIAGNOSIS — I890 Lymphedema, not elsewhere classified: Secondary | ICD-10-CM | POA: Diagnosis present

## 2015-08-28 DIAGNOSIS — I5032 Chronic diastolic (congestive) heart failure: Secondary | ICD-10-CM

## 2015-08-28 DIAGNOSIS — I10 Essential (primary) hypertension: Secondary | ICD-10-CM | POA: Diagnosis present

## 2015-08-28 DIAGNOSIS — N183 Chronic kidney disease, stage 3 unspecified: Secondary | ICD-10-CM

## 2015-08-28 DIAGNOSIS — N179 Acute kidney failure, unspecified: Secondary | ICD-10-CM

## 2015-08-28 DIAGNOSIS — I83009 Varicose veins of unspecified lower extremity with ulcer of unspecified site: Secondary | ICD-10-CM | POA: Diagnosis present

## 2015-08-28 DIAGNOSIS — I130 Hypertensive heart and chronic kidney disease with heart failure and stage 1 through stage 4 chronic kidney disease, or unspecified chronic kidney disease: Principal | ICD-10-CM | POA: Diagnosis present

## 2015-08-28 DIAGNOSIS — Z9114 Patient's other noncompliance with medication regimen: Secondary | ICD-10-CM

## 2015-08-28 DIAGNOSIS — C189 Malignant neoplasm of colon, unspecified: Secondary | ICD-10-CM | POA: Diagnosis present

## 2015-08-28 DIAGNOSIS — I482 Chronic atrial fibrillation, unspecified: Secondary | ICD-10-CM

## 2015-08-28 DIAGNOSIS — D696 Thrombocytopenia, unspecified: Secondary | ICD-10-CM | POA: Diagnosis present

## 2015-08-28 DIAGNOSIS — Z85038 Personal history of other malignant neoplasm of large intestine: Secondary | ICD-10-CM

## 2015-08-28 DIAGNOSIS — L97909 Non-pressure chronic ulcer of unspecified part of unspecified lower leg with unspecified severity: Secondary | ICD-10-CM | POA: Diagnosis present

## 2015-08-28 DIAGNOSIS — Y95 Nosocomial condition: Secondary | ICD-10-CM | POA: Diagnosis present

## 2015-08-28 DIAGNOSIS — Z794 Long term (current) use of insulin: Secondary | ICD-10-CM

## 2015-08-28 LAB — CBC
HEMATOCRIT: 32.5 % — AB (ref 39.0–52.0)
Hemoglobin: 10 g/dL — ABNORMAL LOW (ref 13.0–17.0)
MCH: 26.7 pg (ref 26.0–34.0)
MCHC: 30.8 g/dL (ref 30.0–36.0)
MCV: 86.7 fL (ref 78.0–100.0)
Platelets: 80 10*3/uL — ABNORMAL LOW (ref 150–400)
RBC: 3.75 MIL/uL — ABNORMAL LOW (ref 4.22–5.81)
RDW: 15.2 % (ref 11.5–15.5)
WBC: 18.7 10*3/uL — ABNORMAL HIGH (ref 4.0–10.5)

## 2015-08-28 LAB — BASIC METABOLIC PANEL
Anion gap: 9 (ref 5–15)
BUN: 22 mg/dL — AB (ref 6–20)
CHLORIDE: 107 mmol/L (ref 101–111)
CO2: 23 mmol/L (ref 22–32)
Calcium: 8.7 mg/dL — ABNORMAL LOW (ref 8.9–10.3)
Creatinine, Ser: 1.9 mg/dL — ABNORMAL HIGH (ref 0.61–1.24)
GFR calc Af Amer: 37 mL/min — ABNORMAL LOW (ref >60)
GFR calc non Af Amer: 32 mL/min — ABNORMAL LOW (ref >60)
GLUCOSE: 210 mg/dL — AB (ref 65–99)
POTASSIUM: 4.5 mmol/L (ref 3.5–5.1)
Sodium: 139 mmol/L (ref 135–145)

## 2015-08-28 LAB — MAGNESIUM: Magnesium: 1.3 mg/dL — ABNORMAL LOW (ref 1.7–2.4)

## 2015-08-28 LAB — BRAIN NATRIURETIC PEPTIDE: B NATRIURETIC PEPTIDE 5: 779.2 pg/mL — AB (ref 0.0–100.0)

## 2015-08-28 LAB — I-STAT TROPONIN, ED: Troponin i, poc: 0.02 ng/mL (ref 0.00–0.08)

## 2015-08-28 LAB — GLUCOSE, CAPILLARY: GLUCOSE-CAPILLARY: 155 mg/dL — AB (ref 65–99)

## 2015-08-28 LAB — PHOSPHORUS: PHOSPHORUS: 2.5 mg/dL (ref 2.5–4.6)

## 2015-08-28 LAB — LACTIC ACID, PLASMA: Lactic Acid, Venous: 1.5 mmol/L (ref 0.5–2.0)

## 2015-08-28 MED ORDER — HYDRALAZINE HCL 20 MG/ML IJ SOLN
20.0000 mg | Freq: Once | INTRAMUSCULAR | Status: AC
  Administered 2015-08-28: 20 mg via INTRAVENOUS
  Filled 2015-08-28: qty 1

## 2015-08-28 MED ORDER — BISACODYL 10 MG RE SUPP: 10.0000 mg | RECTAL | Status: DC | PRN

## 2015-08-28 MED ORDER — SODIUM CHLORIDE 0.9 % IV SOLN: 250.0000 mL | INTRAVENOUS | Status: DC | PRN

## 2015-08-28 MED ORDER — SODIUM CHLORIDE 0.9 % IJ SOLN
3.0000 mL | Freq: Two times a day (BID) | INTRAMUSCULAR | Status: DC
  Administered 2015-08-29 – 2015-09-01 (×6): 3 mL via INTRAVENOUS

## 2015-08-28 MED ORDER — INSULIN ASPART 100 UNIT/ML ~~LOC~~ SOLN
0.0000 [IU] | Freq: Three times a day (TID) | SUBCUTANEOUS | Status: DC
  Administered 2015-08-29 (×3): 4 [IU] via SUBCUTANEOUS
  Administered 2015-08-30 (×3): 3 [IU] via SUBCUTANEOUS
  Administered 2015-08-31: 4 [IU] via SUBCUTANEOUS
  Administered 2015-08-31 – 2015-09-01 (×2): 3 [IU] via SUBCUTANEOUS
  Administered 2015-09-02: 4 [IU] via SUBCUTANEOUS

## 2015-08-28 MED ORDER — ONDANSETRON HCL 4 MG/2ML IJ SOLN: 4.0000 mg | Freq: Four times a day (QID) | INTRAMUSCULAR | Status: DC | PRN

## 2015-08-28 MED ORDER — ASPIRIN EC 81 MG PO TBEC
81.0000 mg | DELAYED_RELEASE_TABLET | Freq: Every day | ORAL | Status: DC
  Administered 2015-08-29 – 2015-08-31 (×3): 81 mg via ORAL
  Filled 2015-08-28 (×4): qty 1

## 2015-08-28 MED ORDER — POLYETHYLENE GLYCOL 3350 17 G PO PACK: 17.0000 g | PACK | Freq: Every day | ORAL | Status: DC

## 2015-08-28 MED ORDER — FERROUS SULFATE 325 (65 FE) MG PO TABS
325.0000 mg | ORAL_TABLET | Freq: Two times a day (BID) | ORAL | Status: DC
  Administered 2015-08-29 – 2015-09-02 (×6): 325 mg via ORAL
  Filled 2015-08-28 (×8): qty 1

## 2015-08-28 MED ORDER — INSULIN GLARGINE 100 UNIT/ML ~~LOC~~ SOLN
5.0000 [IU] | Freq: Every day | SUBCUTANEOUS | Status: DC
  Administered 2015-08-28 – 2015-09-01 (×5): 5 [IU] via SUBCUTANEOUS
  Filled 2015-08-28 (×6): qty 0.05

## 2015-08-28 MED ORDER — FERROUS SULFATE 325 (65 FE) MG PO TABS: 325.0000 mg | ORAL_TABLET | Freq: Two times a day (BID) | ORAL | Status: DC

## 2015-08-28 MED ORDER — FUROSEMIDE 10 MG/ML IJ SOLN
60.0000 mg | Freq: Two times a day (BID) | INTRAMUSCULAR | Status: DC
  Administered 2015-08-28: 60 mg via INTRAVENOUS
  Filled 2015-08-28: qty 6

## 2015-08-28 MED ORDER — FUROSEMIDE 10 MG/ML IJ SOLN
40.0000 mg | Freq: Once | INTRAMUSCULAR | Status: AC
  Administered 2015-08-28: 40 mg via INTRAVENOUS
  Filled 2015-08-28: qty 4

## 2015-08-28 MED ORDER — ISOSORB DINITRATE-HYDRALAZINE 20-37.5 MG PO TABS
1.0000 | ORAL_TABLET | Freq: Three times a day (TID) | ORAL | Status: DC
  Administered 2015-08-29 – 2015-09-02 (×10): 1 via ORAL
  Filled 2015-08-28 (×12): qty 1

## 2015-08-28 MED ORDER — ACETAMINOPHEN 325 MG PO TABS
650.0000 mg | ORAL_TABLET | ORAL | Status: DC | PRN
  Administered 2015-08-28: 650 mg via ORAL
  Filled 2015-08-28: qty 2

## 2015-08-28 MED ORDER — ALBUTEROL SULFATE (2.5 MG/3ML) 0.083% IN NEBU
2.5000 mg | INHALATION_SOLUTION | Freq: Four times a day (QID) | RESPIRATORY_TRACT | Status: DC
  Administered 2015-08-28 – 2015-08-29 (×5): 2.5 mg via RESPIRATORY_TRACT
  Filled 2015-08-28 (×6): qty 3

## 2015-08-28 MED ORDER — SODIUM CHLORIDE 0.9 % IJ SOLN: 3.0000 mL | INTRAMUSCULAR | Status: DC | PRN

## 2015-08-28 MED ORDER — CARVEDILOL 12.5 MG PO TABS: 12.5000 mg | ORAL_TABLET | Freq: Two times a day (BID) | ORAL | Status: DC | PRN

## 2015-08-28 MED ORDER — INSULIN ASPART 100 UNIT/ML ~~LOC~~ SOLN: 0.0000 [IU] | Freq: Every day | SUBCUTANEOUS | Status: DC

## 2015-08-28 NOTE — ED Provider Notes (Signed)
CSN: [id number]     Arrival date & time 08/28/15  1150 History   First MD Initiated Contact with Patient 08/28/15 1218     Chief Complaint  Patient presents with  . Hypertension  . Headache    HPI   [name] is an 82 y.o. male with history of uncontrolled HTN, uncontrolled insulin-dependent DM, CAD, afib, CKD, CHF who presents to the ED for evaluation of headache and HTN. Pt states he started experiencing a dull, diffuse headache this morning that progressively got worse. Denies blurred vision, dizziness, weakness. States his headache has improved in the ED. He states he has also felt more SOB than usual. He has also noticed increased dry cough lately. In the ED upon ambulation he de-satted to 84% which is new for him; pt states he usually does not require oxygen at baseline. Denies chest pain. Denies abdominal pain, n/v/d. Pt states he does not take any of his prescribed medications at all. He states he "does better" when he does not take any medications.   Past Medical History  Diagnosis Date  . CHF (congestive heart failure) ([place])   . Diabetes mellitus without complication ([place])   . Hypertension   . Coronary artery disease   . Atrial fibrillation ([place])   . Chronic kidney disease    Past Surgical History  Procedure Laterality Date  . Leg surgery      Left leg surgery. broken leg  . Esophagogastroduodenoscopy  06/19/2012    Procedure: ESOPHAGOGASTRODUODENOSCOPY (EGD);  Surgeon: Beryle Beams, MD;  Location: [facility] ENDOSCOPY;  Service: Endoscopy;  Laterality: N/A;  . Colonoscopy  06/21/2012    Procedure: COLONOSCOPY;  Surgeon: Juanita Craver, MD;  Location: [facility] ENDOSCOPY;  Service: Endoscopy;  Laterality: N/A;  . Colonoscopy Left 02/13/2014    Procedure: COLONOSCOPY;  Surgeon: Juanita Craver, MD;  Location: [place];  Service: Endoscopy;  Laterality: Left;  . Laparoscopic right colectomy Right 02/15/2014    Procedure: LAPAROSCOPIC ASISSTED RIGHT  COLECTOMY, POSSIBLE OPEN;  Surgeon:  Rolm Bookbinder, MD;  Location: [place];  Service: General;  Laterality: Right;   Family History  Problem Relation Age of Onset  . Brain cancer Mother   . Diabetes type II Sister   . Narcolepsy Paternal Uncle    Social History  Substance Use Topics  . Smoking status: Former Smoker    Quit date: 08/26/1998  . Smokeless tobacco: Never Used  . Alcohol Use: No     Comment: in the past    Review of Systems  All other systems reviewed and are negative.     Allergies  Review of patient's allergies indicates no known allergies.  Home Medications   Prior to Admission medications   Medication Sig Start Date End Date Taking? Authorizing Provider  aspirin 81 MG tablet Take 1 tablet (81 mg total) by mouth daily. 11/29/14   Nishant Dhungel, MD  bisacodyl (DULCOLAX) 10 MG suppository Place 1 suppository (10 mg total) rectally as needed for moderate constipation. 05/18/15   Varney Biles, MD  carvedilol (COREG) 12.5 MG tablet Take 12.5 mg by mouth 2 (two) times daily as needed (For heart.).     Historical Provider, MD  cephALEXin (KEFLEX) 500 MG capsule Take 1 capsule (500 mg total) by mouth every 12 (twelve) hours. 08/05/15   Donne Hazel, MD  ferrous sulfate 325 (65 FE) MG tablet Take 1 tablet (325 mg total) by mouth 2 (two) times daily with a meal. 11/29/14   Nishant Dhungel, MD  furosemide (  LASIX) 40 MG tablet Take 1 tablet (40 mg total) by mouth daily. Patient taking differently: Take 40 mg by mouth daily as needed (For swelling.).  01/10/14   Denita Lung, MD  glimepiride (AMARYL) 2 MG tablet Take 1 tablet (2 mg total) by mouth daily with breakfast. 11/29/14   Nishant Dhungel, MD  hydrALAZINE (APRESOLINE) 25 MG tablet Take 1 tablet (25 mg total) by mouth every 6 (six) hours. 08/05/15   Donne Hazel, MD  insulin aspart (NOVOLOG) 100 UNIT/ML injection Inject 0-15 Units into the skin 3 (three) times daily with meals. 11/29/14   Nishant Dhungel, MD  insulin glargine (LANTUS) 100 UNIT/ML  injection Inject 0.1 mLs (10 Units total) into the skin at bedtime. 11/29/14   Nishant Dhungel, MD  isosorbide-hydrALAZINE (BIDIL) 20-37.5 MG tablet Take 1 tablet by mouth 3 (three) times daily. 08/05/15   Donne Hazel, MD  levalbuterol Penne Lash) 0.63 MG/3ML nebulizer solution Take 3 mLs (0.63 mg total) by nebulization every 6 (six) hours as needed for wheezing or shortness of breath. 11/29/14   Nishant Dhungel, MD  polyethylene glycol (MIRALAX) packet Take 17 g by mouth daily. 05/18/15   Ankit Nanavati, MD   BP 191/139 mmHg  Pulse 65  Temp(Src) 99 F (37.2 C) (Oral)  Resp 21  SpO2 100% Physical Exam  Constitutional: He is oriented to person, place, and time. No distress.  Appears chronically ill  HENT:  Right Ear: External ear normal.  Left Ear: External ear normal.  Nose: Nose normal.  Mouth/Throat: Oropharynx is clear and moist. No oropharyngeal exudate.  Eyes: Conjunctivae and EOM are normal. Pupils are equal, round, and reactive to light. Right eye exhibits no nystagmus. Left eye exhibits no nystagmus.  Neck: Normal range of motion. Neck supple.  Cardiovascular: Normal rate, regular rhythm and normal heart sounds.   Pulmonary/Chest: Effort normal. No respiratory distress. He exhibits no tenderness.  Diffuse bilateral soft expiratory wheezing. Few scattered crackles that clear with cough  Abdominal: Soft. Bowel sounds are normal. He exhibits no distension. There is no tenderness.  Musculoskeletal: He exhibits no edema.  Bilateral 2+ LE edema. Bilateral lower legs with chronic venous stasis changes, some open areas.   Neurological: He is alert and oriented to person, place, and time. No cranial nerve deficit.  Skin: Skin is warm and dry. He is not diaphoretic.  Psychiatric: He has a normal mood and affect.  Nursing note and vitals reviewed.  Filed Vitals:   08/28/15 1300 08/28/15 1400 08/28/15 1415 08/28/15 1430  BP: 210/60 179/54 169/62 177/77  Pulse: 55 61 63 64  Temp:       TempSrc:      Resp: 20 19 19 25   SpO2: 98% 100% 100% 99%     ED Course  Procedures (including critical care time) Labs Review Labs Reviewed  BASIC METABOLIC PANEL - Abnormal; Notable for the following:    Glucose, Bld 210 (*)    BUN 22 (*)    Creatinine, Ser 1.90 (*)    Calcium 8.7 (*)    GFR calc non Af Amer 32 (*)    GFR calc Af Amer 37 (*)    All other components within normal limits  CBC - Abnormal; Notable for the following:    WBC 18.7 (*)    RBC 3.75 (*)    Hemoglobin 10.0 (*)    HCT 32.5 (*)    Platelets 80 (*)    All other components within normal limits  BRAIN NATRIURETIC PEPTIDE -  Abnormal; Notable for the following:    B Natriuretic Peptide 779.2 (*)    All other components within normal limits  MAGNESIUM  PHOSPHORUS  I-STAT TROPOININ, ED    Imaging Review Dg Chest 2 View  08/28/2015  CLINICAL DATA:  Short of breath EXAM: CHEST  2 VIEW COMPARISON:  11/23/2014 FINDINGS: There is hazy airspace disease in the right mid and lower lung zones. There is opacity at the left costophrenic angle. Cardiomegaly. Left upper lung zone is clear. No pneumothorax. IMPRESSION: Bilateral airspace disease suggesting bilateral bronchopneumonia or asymmetric edema. Electronically Signed   By: Marybelle Killings M.D.   On: 08/28/2015 13:52   Ct Head Wo Contrast  08/28/2015  CLINICAL DATA:  Headache.  Hypertension. EXAM: CT HEAD WITHOUT CONTRAST TECHNIQUE: Contiguous axial images were obtained from the base of the skull through the vertex without intravenous contrast. COMPARISON:  03/26/2013 FINDINGS: The brainstem, cerebellum, cerebral peduncles, thalami, basal ganglia, basilar cisterns, and ventricular system appear within normal limits. Periventricular white matter and corona radiata hypodensities favor chronic ischemic microvascular white matter disease. No intracranial hemorrhage, mass lesion, or acute CVA. Chronic left maxillary sinusitis noted with mild chronic ethmoid sinusitis. There is  atherosclerotic calcification of the cavernous carotid arteries bilaterally. IMPRESSION: 1. No acute intracranial findings. 2. Periventricular white matter and corona radiata hypodensities favor chronic ischemic microvascular white matter disease. 3. Chronic left maxillary sinusitis and mild chronic ethmoid sinusitis. Electronically Signed   By: Van Clines M.D.   On: 08/28/2015 15:38   I have personally reviewed and evaluated these images and lab results as part of my medical decision-making.   EKG Interpretation   Date/Time:  Monday August 28 2015 12:06:16 EST Ventricular Rate:  70 PR Interval:    QRS Duration: 80 QT Interval:  412 QTC Calculation: 444 R Axis:   26 Text Interpretation:  Atrial flutter with variable A-V block Septal  infarct , age undetermined Abnormal ECG Confirmed by Johnney Killian, MD, Jeannie Done  [id number]) on 08/28/2015 12:57:36 PM      MDM   Final diagnoses:  Hypoxia  Hypertensive urgency  Acute on chronic heart failure, unspecified heart failure type ([place])    Pt de-sats upon laying back but will maintain mid 90s sitting up. CXR shows bilateral either bronchopneumonia or asymmetric edema. CT head negative. BP improved with lasix and hydralazine, though still elevated. Headache resolved. Pt does have a white count of 18.7. Pt has chronic wounds on his bilateral LE. He does have wound care attending to them. His wounds appear chronic and I have low suspicion for new cellulitis. . Cr is surprisingly improved from prior, at 1.9 today. BNP 779. Given new oxygen requirement, evidence of CHF, and hypertensive urgency, will admit to medicine for CHF exacerbation with hypertensive urgency and possible pneumonia.     Anne Ng, PA-C 08/28/15 1656  Charlesetta Shanks, MD 08/30/15 [phone]

## 2015-08-28 NOTE — Progress Notes (Signed)
Orthopedic Tech Progress Note Patient Details:  [name] 04-Feb-1934 [id number]  Ortho Devices Type of Ortho Device: Haematologist Ortho Device/Splint Location: (B) LE  Ortho Device/Splint Interventions: Ordered, Application   Braulio Bosch 08/28/2015, 7:29 PM

## 2015-08-28 NOTE — H&P (Signed)
[facility] History and Physical  Reno Stickels [id number] DOB: [birth year] DOA: 08/28/2015  Referring physician: phefier PCP: Philis Fendt, MD   Chief Complaint: worsening LE edema/sob/headache and hypertension  HPI: [name] is a 82 y.o. male with a past medical history that includes insulin-dependent diabetes, chronic diastolic heart failure, hypertension, chronic lower extremity edema since to the emergency department with the chief complaint of worsening lower extremity edema and shortness of breath. Initial evaluation in the emergency department reveals acute respiratory failure likely related to CHF exacerbation.  Information is obtained from the patient reports intermittent headache since this morning that had progressively gotten worse. He reports noncompliance with his medications. He denies blurred vision dizziness syncope or near-syncope. Associated symptoms include shortness of breath moist nonproductive cough and worsening lower extremity edema. He denies chest pain palpitations fever chills abdominal pain nausea vomiting dysuria hematuria frequency or urgency. He does report noncompliance with his medications as he "does better" when he does not take them.  In the emergency department he ambulated and oxygen saturation level dropped to 84%. He is provided with 2 L nasal cannula oxygen saturation level increased to 99%. He is also provided with 40 mg of Lasix intravenously and 20 mg of hydralazine intravenously. He's afebrile with a blood pressure of 215/84.   Review of Systems:  10 point review review of systems complete and all systems negative except as indicated in history of present illness Past Medical History  Diagnosis Date  . CHF (congestive heart failure) ([place])   . Diabetes mellitus without complication ([place])   . Hypertension   . Coronary artery disease   . Atrial fibrillation ([place])   . Chronic kidney disease    Past Surgical History    Procedure Laterality Date  . Leg surgery      Left leg surgery. broken leg  . Esophagogastroduodenoscopy  06/19/2012    Procedure: ESOPHAGOGASTRODUODENOSCOPY (EGD);  Surgeon: Beryle Beams, MD;  Location: [facility] ENDOSCOPY;  Service: Endoscopy;  Laterality: N/A;  . Colonoscopy  06/21/2012    Procedure: COLONOSCOPY;  Surgeon: Juanita Craver, MD;  Location: [facility] ENDOSCOPY;  Service: Endoscopy;  Laterality: N/A;  . Colonoscopy Left 02/13/2014    Procedure: COLONOSCOPY;  Surgeon: Juanita Craver, MD;  Location: [place];  Service: Endoscopy;  Laterality: Left;  . Laparoscopic right colectomy Right 02/15/2014    Procedure: LAPAROSCOPIC ASISSTED RIGHT  COLECTOMY, POSSIBLE OPEN;  Surgeon: Rolm Bookbinder, MD;  Location: [place];  Service: General;  Laterality: Right;   Social History:  reports that he quit smoking about 17 years ago. He has never used smokeless tobacco. He reports that he does not drink alcohol or use illicit drugs. Uses a walker for ambulation lives with his daughter who he reports he will not be going back to her "nasty apartment" fairly independent with ADLs No Known Allergies  Family History  Problem Relation Age of Onset  . Brain cancer Mother   . Diabetes type II Sister   . Narcolepsy Paternal Uncle      Prior to Admission medications   Medication Sig Start Date End Date Taking? Authorizing Provider  aspirin 81 MG tablet Take 1 tablet (81 mg total) by mouth daily. 11/29/14   Nishant Dhungel, MD  bisacodyl (DULCOLAX) 10 MG suppository Place 1 suppository (10 mg total) rectally as needed for moderate constipation. 05/18/15   Varney Biles, MD  carvedilol (COREG) 12.5 MG tablet Take 12.5 mg by mouth 2 (two) times daily as needed (For heart.).  Historical Provider, MD  cephALEXin (KEFLEX) 500 MG capsule Take 1 capsule (500 mg total) by mouth every 12 (twelve) hours. 08/05/15   Donne Hazel, MD  ferrous sulfate 325 (65 FE) MG tablet Take 1 tablet (325 mg total) by mouth 2 (two)  times daily with a meal. 11/29/14   Nishant Dhungel, MD  furosemide (LASIX) 40 MG tablet Take 1 tablet (40 mg total) by mouth daily. Patient taking differently: Take 40 mg by mouth daily as needed (For swelling.).  01/10/14   Denita Lung, MD  glimepiride (AMARYL) 2 MG tablet Take 1 tablet (2 mg total) by mouth daily with breakfast. 11/29/14   Nishant Dhungel, MD  hydrALAZINE (APRESOLINE) 25 MG tablet Take 1 tablet (25 mg total) by mouth every 6 (six) hours. 08/05/15   Donne Hazel, MD  insulin aspart (NOVOLOG) 100 UNIT/ML injection Inject 0-15 Units into the skin 3 (three) times daily with meals. 11/29/14   Nishant Dhungel, MD  insulin glargine (LANTUS) 100 UNIT/ML injection Inject 0.1 mLs (10 Units total) into the skin at bedtime. 11/29/14   Nishant Dhungel, MD  isosorbide-hydrALAZINE (BIDIL) 20-37.5 MG tablet Take 1 tablet by mouth 3 (three) times daily. 08/05/15   Donne Hazel, MD  levalbuterol Penne Lash) 0.63 MG/3ML nebulizer solution Take 3 mLs (0.63 mg total) by nebulization every 6 (six) hours as needed for wheezing or shortness of breath. 11/29/14   Nishant Dhungel, MD  polyethylene glycol (MIRALAX) packet Take 17 g by mouth daily. 05/18/15   Varney Biles, MD   Physical Exam: Filed Vitals:   08/28/15 1300 08/28/15 1400 08/28/15 1415 08/28/15 1430  BP: 210/60 179/54 169/62 177/77  Pulse: 55 61 63 64  Temp:      TempSrc:      Resp: 20 19 19 25   SpO2: 98% 100% 100% 99%    Wt Readings from Last 3 Encounters:  08/05/15 112.9 kg (248 lb 14.4 oz)  05/18/15 108.863 kg (240 lb)  11/29/14 107.684 kg (237 lb 6.4 oz)    General:  Appears calm and comfortable, obese and irritable Eyes: PERRL, normal lids, irises & conjunctiva ENT: grossly normal hearing, lips & tongue Neck: no LAD, masses or thyromegaly Cardiovascular: RRR, no m/r/g. A lateral LE edema with Ace wraps. Leg dressing with drainage no odor Telemetry: SR, no arrhythmias  Respiratory:  Normal respiratory effort. Rest sounds with  fairly good airflow. Bilateral basilar crackles. Faint diffuse end expiratory wheezing Abdomen: soft, ntnd Skin: no rash or induration seen on limited exam Musculoskeletal: grossly normal tone BUE/BLE Psychiatric: grossly normal mood and affect, speech fluent and appropriate Neurologic: grossly non-focal. speech clear facial symmetry           Labs on Admission:  Basic Metabolic Panel:  Recent Labs Lab 08/28/15 1207  NA 139  K 4.5  CL 107  CO2 23  GLUCOSE 210*  BUN 22*  CREATININE 1.90*  CALCIUM 8.7*   Liver Function Tests: No results for input(s): AST, ALT, ALKPHOS, BILITOT, PROT, ALBUMIN in the last 168 hours. No results for input(s): LIPASE, AMYLASE in the last 168 hours. No results for input(s): AMMONIA in the last 168 hours. CBC:  Recent Labs Lab 08/28/15 1207  WBC 18.7*  HGB 10.0*  HCT 32.5*  MCV 86.7  PLT 80*   Cardiac Enzymes: No results for input(s): CKTOTAL, CKMB, CKMBINDEX, TROPONINI in the last 168 hours.  BNP (last 3 results)  Recent Labs  11/23/14 1551 08/02/15 0510 08/28/15 1207  BNP 978.4* 191.9*  779.2*    ProBNP (last 3 results) No results for input(s): PROBNP in the last 8760 hours.  CBG: No results for input(s): GLUCAP in the last 168 hours.  Radiological Exams on Admission: Dg Chest 2 View  08/28/2015  CLINICAL DATA:  Short of breath EXAM: CHEST  2 VIEW COMPARISON:  11/23/2014 FINDINGS: There is hazy airspace disease in the right mid and lower lung zones. There is opacity at the left costophrenic angle. Cardiomegaly. Left upper lung zone is clear. No pneumothorax. IMPRESSION: Bilateral airspace disease suggesting bilateral bronchopneumonia or asymmetric edema. Electronically Signed   By: Marybelle Killings M.D.   On: 08/28/2015 13:52   Ct Head Wo Contrast  08/28/2015  CLINICAL DATA:  Headache.  Hypertension. EXAM: CT HEAD WITHOUT CONTRAST TECHNIQUE: Contiguous axial images were obtained from the base of the skull through the vertex without  intravenous contrast. COMPARISON:  03/26/2013 FINDINGS: The brainstem, cerebellum, cerebral peduncles, thalami, basal ganglia, basilar cisterns, and ventricular system appear within normal limits. Periventricular white matter and corona radiata hypodensities favor chronic ischemic microvascular white matter disease. No intracranial hemorrhage, mass lesion, or acute CVA. Chronic left maxillary sinusitis noted with mild chronic ethmoid sinusitis. There is atherosclerotic calcification of the cavernous carotid arteries bilaterally. IMPRESSION: 1. No acute intracranial findings. 2. Periventricular white matter and corona radiata hypodensities favor chronic ischemic microvascular white matter disease. 3. Chronic left maxillary sinusitis and mild chronic ethmoid sinusitis. Electronically Signed   By: Van Clines M.D.   On: 08/28/2015 15:38    EKG: Independently reviewed. Atrial flutter with variable A-V block Septal infarct , age undetermined  Assessment/Plan Principal Problem:   Acute respiratory failure (HCC) Active Problems:   Cellulitis of left lower extremity   Anemia of chronic renal failure, stage 3 (moderate)   Uncontrolled diabetes mellitus with diabetic nephropathy, with long-term current use of insulin (HCC)   Accelerated hypertension   Thrombocytopenia (HCC)   Noncompliance with medications   Acute on chronic diastolic heart failure (HCC)   Atrial fibrillation ([place])  #1. Acute respiratory failure likely related to acute on chronic diastolic heart failure due to noncompliance. Chest x-ray asymmetric edema and/or bronchopneumonia. Patient afebrile with a white count 18.7. No dynamically stable. BNP 179.2. -Admit to telemetry -Continue IV Lasix -Monitor intake and output -Obtain daily weights -Continue his home Coreg,bidil -Wean oxygen as able -Ambulate MRI documenting respiratory effort and oxygen saturation level  #2. Lower extremity edema. Some chronic in the setting of  chronic venous stasis changes and lymphedema. Chart review indicates recent hospitalization for same. Dopplers at that time negative for DVT. Wound cultures positive for MSSA. Discharged on Keflex. Patient unsure if he completed the course. afebrile but does have a leukocytosis. -Rocephin -Wound care  #3. Hypertension. Uncontrolled in the emergency department likely related to noncompliance with medications -Improved on admission after Lasix and hydralazine -We'll resume home medication -Monitor closely  4. Acute on chronic diastolic heart failure. Last echo in June 2016 revealing grade 2 diastolic dysfunction. -We will repeat given above -Continue Coreg and aspirin -Monitor intake and output -Obtain daily weights  #5. History of A. Fib. Not on anticoagulation fall risk. Rate controlled -Continue home beta blocker  #6. Diabetes. Home medications include Lantus. -Serum glucose to 10 on admission. -Continue Lantus at half his home indication dose as his appetite is unreliable -Scale insulin for optimal control -Check hemoglobin A1c  #7. Chronic kidney disease. Current creatinine of 1.9 is at his baseline -Monitor urine output -Hold nephrotoxins if able  #  8. Thrombocytopenia. Chronic. No signs symptoms of bleeding -Monitor -SCDs  #9. Anemia. Likely related to chronic disease. Stable at baseline  Wound consult   Code Status: full DVT Prophylaxis: Family Communication: none present Disposition Plan: may need placement. Reports not willing to go back to daughter apartment  Time spent: 70 minutes  [facility]

## 2015-08-28 NOTE — ED Notes (Signed)
Upon entering the room pt at 83% on RA. Pt placed on 2L via [place].

## 2015-08-28 NOTE — Progress Notes (Signed)
NP text-paged about pt's high BP. No response.

## 2015-08-28 NOTE — ED Notes (Signed)
Pt arrived by [facility] for HTN, headache. Recently discharged home from NH. Pt has severe swelling to legs. spo2 89% on room air for ems.

## 2015-08-29 ENCOUNTER — Other Ambulatory Visit (HOSPITAL_COMMUNITY): Payer: Self-pay

## 2015-08-29 ENCOUNTER — Ambulatory Visit (HOSPITAL_BASED_OUTPATIENT_CLINIC_OR_DEPARTMENT_OTHER): Payer: Medicare Other

## 2015-08-29 ENCOUNTER — Observation Stay (HOSPITAL_COMMUNITY): Payer: Medicare Other

## 2015-08-29 DIAGNOSIS — I83009 Varicose veins of unspecified lower extremity with ulcer of unspecified site: Secondary | ICD-10-CM | POA: Diagnosis present

## 2015-08-29 DIAGNOSIS — I5033 Acute on chronic diastolic (congestive) heart failure: Secondary | ICD-10-CM | POA: Diagnosis present

## 2015-08-29 DIAGNOSIS — I4892 Unspecified atrial flutter: Secondary | ICD-10-CM

## 2015-08-29 DIAGNOSIS — R0600 Dyspnea, unspecified: Secondary | ICD-10-CM

## 2015-08-29 DIAGNOSIS — I482 Chronic atrial fibrillation: Secondary | ICD-10-CM | POA: Diagnosis not present

## 2015-08-29 DIAGNOSIS — Z85038 Personal history of other malignant neoplasm of large intestine: Secondary | ICD-10-CM | POA: Diagnosis not present

## 2015-08-29 DIAGNOSIS — Y95 Nosocomial condition: Secondary | ICD-10-CM | POA: Diagnosis present

## 2015-08-29 DIAGNOSIS — I130 Hypertensive heart and chronic kidney disease with heart failure and stage 1 through stage 4 chronic kidney disease, or unspecified chronic kidney disease: Secondary | ICD-10-CM | POA: Diagnosis present

## 2015-08-29 DIAGNOSIS — R042 Hemoptysis: Secondary | ICD-10-CM

## 2015-08-29 DIAGNOSIS — I160 Hypertensive urgency: Secondary | ICD-10-CM | POA: Diagnosis present

## 2015-08-29 DIAGNOSIS — Z833 Family history of diabetes mellitus: Secondary | ICD-10-CM | POA: Diagnosis not present

## 2015-08-29 DIAGNOSIS — E1121 Type 2 diabetes mellitus with diabetic nephropathy: Secondary | ICD-10-CM | POA: Diagnosis present

## 2015-08-29 DIAGNOSIS — Z808 Family history of malignant neoplasm of other organs or systems: Secondary | ICD-10-CM | POA: Diagnosis not present

## 2015-08-29 DIAGNOSIS — D696 Thrombocytopenia, unspecified: Secondary | ICD-10-CM

## 2015-08-29 DIAGNOSIS — J9600 Acute respiratory failure, unspecified whether with hypoxia or hypercapnia: Secondary | ICD-10-CM | POA: Diagnosis present

## 2015-08-29 DIAGNOSIS — I2510 Atherosclerotic heart disease of native coronary artery without angina pectoris: Secondary | ICD-10-CM | POA: Diagnosis present

## 2015-08-29 DIAGNOSIS — N179 Acute kidney failure, unspecified: Secondary | ICD-10-CM | POA: Diagnosis present

## 2015-08-29 DIAGNOSIS — E119 Type 2 diabetes mellitus without complications: Secondary | ICD-10-CM | POA: Diagnosis present

## 2015-08-29 DIAGNOSIS — J9601 Acute respiratory failure with hypoxia: Secondary | ICD-10-CM | POA: Diagnosis present

## 2015-08-29 DIAGNOSIS — D631 Anemia in chronic kidney disease: Secondary | ICD-10-CM | POA: Diagnosis present

## 2015-08-29 DIAGNOSIS — I4891 Unspecified atrial fibrillation: Secondary | ICD-10-CM | POA: Diagnosis present

## 2015-08-29 DIAGNOSIS — Z7984 Long term (current) use of oral hypoglycemic drugs: Secondary | ICD-10-CM | POA: Diagnosis not present

## 2015-08-29 DIAGNOSIS — N184 Chronic kidney disease, stage 4 (severe): Secondary | ICD-10-CM | POA: Diagnosis present

## 2015-08-29 DIAGNOSIS — Z6836 Body mass index (BMI) 36.0-36.9, adult: Secondary | ICD-10-CM | POA: Diagnosis not present

## 2015-08-29 DIAGNOSIS — Z9119 Patient's noncompliance with other medical treatment and regimen: Secondary | ICD-10-CM | POA: Diagnosis not present

## 2015-08-29 DIAGNOSIS — I890 Lymphedema, not elsewhere classified: Secondary | ICD-10-CM | POA: Diagnosis present

## 2015-08-29 DIAGNOSIS — J180 Bronchopneumonia, unspecified organism: Secondary | ICD-10-CM | POA: Diagnosis present

## 2015-08-29 DIAGNOSIS — N183 Chronic kidney disease, stage 3 (moderate): Secondary | ICD-10-CM | POA: Diagnosis not present

## 2015-08-29 DIAGNOSIS — L97909 Non-pressure chronic ulcer of unspecified part of unspecified lower leg with unspecified severity: Secondary | ICD-10-CM | POA: Diagnosis present

## 2015-08-29 DIAGNOSIS — I10 Essential (primary) hypertension: Secondary | ICD-10-CM | POA: Diagnosis not present

## 2015-08-29 DIAGNOSIS — L03116 Cellulitis of left lower limb: Secondary | ICD-10-CM | POA: Diagnosis present

## 2015-08-29 DIAGNOSIS — E1165 Type 2 diabetes mellitus with hyperglycemia: Secondary | ICD-10-CM | POA: Diagnosis present

## 2015-08-29 DIAGNOSIS — Z794 Long term (current) use of insulin: Secondary | ICD-10-CM | POA: Diagnosis not present

## 2015-08-29 DIAGNOSIS — C189 Malignant neoplasm of colon, unspecified: Secondary | ICD-10-CM | POA: Diagnosis present

## 2015-08-29 DIAGNOSIS — Z87891 Personal history of nicotine dependence: Secondary | ICD-10-CM | POA: Diagnosis not present

## 2015-08-29 LAB — GLUCOSE, CAPILLARY
GLUCOSE-CAPILLARY: 157 mg/dL — AB (ref 65–99)
GLUCOSE-CAPILLARY: 158 mg/dL — AB (ref 65–99)
Glucose-Capillary: 155 mg/dL — ABNORMAL HIGH (ref 65–99)
Glucose-Capillary: 161 mg/dL — ABNORMAL HIGH (ref 65–99)

## 2015-08-29 LAB — BASIC METABOLIC PANEL
ANION GAP: 7 (ref 5–15)
BUN: 24 mg/dL — AB (ref 6–20)
CHLORIDE: 106 mmol/L (ref 101–111)
CO2: 24 mmol/L (ref 22–32)
Calcium: 8.5 mg/dL — ABNORMAL LOW (ref 8.9–10.3)
Creatinine, Ser: 2.41 mg/dL — ABNORMAL HIGH (ref 0.61–1.24)
GFR, EST AFRICAN AMERICAN: 28 mL/min — AB (ref >60)
GFR, EST NON AFRICAN AMERICAN: 24 mL/min — AB (ref >60)
Glucose, Bld: 167 mg/dL — ABNORMAL HIGH (ref 65–99)
POTASSIUM: 4 mmol/L (ref 3.5–5.1)
SODIUM: 137 mmol/L (ref 135–145)

## 2015-08-29 LAB — TSH: TSH: 0.931 u[IU]/mL (ref 0.350–4.500)

## 2015-08-29 LAB — STREP PNEUMONIAE URINARY ANTIGEN: Strep Pneumo Urinary Antigen: NEGATIVE

## 2015-08-29 LAB — T4, FREE: FREE T4: 1.28 ng/dL — AB (ref 0.61–1.12)

## 2015-08-29 MED ORDER — MAGNESIUM SULFATE 2 GM/50ML IV SOLN
2.0000 g | Freq: Once | INTRAVENOUS | Status: AC
  Administered 2015-08-29: 2 g via INTRAVENOUS
  Filled 2015-08-29: qty 50

## 2015-08-29 MED ORDER — VANCOMYCIN HCL 10 G IV SOLR
2000.0000 mg | Freq: Once | INTRAVENOUS | Status: AC
  Administered 2015-08-29: 2000 mg via INTRAVENOUS
  Filled 2015-08-29: qty 2000

## 2015-08-29 MED ORDER — PIPERACILLIN-TAZOBACTAM 3.375 G IVPB
3.3750 g | Freq: Three times a day (TID) | INTRAVENOUS | Status: DC
  Administered 2015-08-29 – 2015-08-31 (×6): 3.375 g via INTRAVENOUS
  Filled 2015-08-29 (×8): qty 50

## 2015-08-29 MED ORDER — OXYCODONE-ACETAMINOPHEN 5-325 MG PO TABS: 1.0000 | ORAL_TABLET | ORAL | Status: DC | PRN

## 2015-08-29 MED ORDER — SODIUM CHLORIDE 0.9 % IV SOLN
1500.0000 mg | INTRAVENOUS | Status: DC
Start: 2015-08-31 — End: 2015-08-31
  Filled 2015-08-29: qty 1500

## 2015-08-29 MED ORDER — OXYCODONE-ACETAMINOPHEN 5-325 MG PO TABS
2.0000 | ORAL_TABLET | Freq: Once | ORAL | Status: DC
  Filled 2015-08-29: qty 2

## 2015-08-29 MED ORDER — APIXABAN 5 MG PO TABS
5.0000 mg | ORAL_TABLET | Freq: Two times a day (BID) | ORAL | Status: DC
  Administered 2015-08-29 – 2015-08-31 (×5): 5 mg via ORAL
  Filled 2015-08-29 (×5): qty 1

## 2015-08-29 MED ORDER — CARVEDILOL 12.5 MG PO TABS
12.5000 mg | ORAL_TABLET | Freq: Two times a day (BID) | ORAL | Status: DC
  Administered 2015-08-29 – 2015-08-30 (×2): 12.5 mg via ORAL
  Filled 2015-08-29 (×2): qty 1

## 2015-08-29 MED ORDER — CARVEDILOL 12.5 MG PO TABS: 12.5000 mg | ORAL_TABLET | Freq: Two times a day (BID) | ORAL | Status: DC | PRN

## 2015-08-29 NOTE — Clinical Social Work Note (Signed)
Clinical Social Work Assessment  Patient Details  Name: [name] MRN: [id number] Date of Birth: 11-May-1934  Date of referral:  08/29/15               Reason for consult:  Facility Placement                Permission sought to share information with:  Family Supports Permission granted to share information::  Yes, Verbal Permission Granted  Name::     [name]  Agency::     Relationship::  dtr  Contact Information:     Housing/Transportation Living arrangements for the past 2 months:  Apartment, [place] Community of Information:  Patient Patient Interpreter Needed:  None Criminal Activity/Legal Involvement Pertinent to Current Situation/Hospitalization:    Significant Relationships:  Adult Children Lives with:  Adult Children Do you feel safe going back to the place where you live?  Yes Need for family participation in patient care:  Yes (Comment)  Care giving concerns:  Pt lives at home with dtr, Sondra Barges, unclear how much [name] can assist at home but PT is recommending SNF   Social Worker assessment / plan:  CSW spoke with pt concerning PT recommendation for SNF and recent SNF stay.  CSW explained concerns about pt returning home with limited assist and continuing medical issues.  Employment status:  Retired Forensic scientist:  [employer] PT Recommendations:  [place] / Referral to community resources:  [place]  Patient/Family's Response to care:  Pt is not agreeable to returning to SNF for rehab- states all they want to do is take his money and feels as if his needs can be handled at different level of care.  Pt repetitively stated he wants his own apartment where he can live with someone there to assist (like an aid)- CSW inquired about ALF but pt states this is not want he wants- states he wants to be in a retirement community but receive assistance.  CSW explained that options from the hospital at this point would  be to go to SNF for rehab/continued medical assistance or return home with dtr and received home health services- made it clear that CSW cannot provide pt with apartment in retirement community and that this would be something he needs to pursue outside the hospital.  Pt states he will return home when ready to leave the hospital- request CSW follow up with dtr about getting some important bills paid- CSW attempted to call dtr- no answer and could not leave message.  Patient/Family's Understanding of and Emotional Response to Diagnosis, Current Treatment, and Prognosis:  No questions or concerns at this time.  Emotional Assessment Appearance:  Appears stated age Attitude/Demeanor/Rapport:    Affect (typically observed):  Appropriate, Agitated Orientation:  Oriented to Self, Oriented to Place, Oriented to  Time, Oriented to Situation Alcohol / Substance use:  Not Applicable Psych involvement (Current and /or in the community):  No (Comment)  Discharge Needs  Concerns to be addressed:  Patient refuses services, Care Coordination, Home Safety Concerns Readmission within the last 30 days:  Yes Current discharge risk:  Physical Impairment Barriers to Discharge:  Continued Medical Work up   [employer], LCSW 08/29/2015, 12:53 PM

## 2015-08-29 NOTE — NC FL2 (Signed)
[place] LEVEL OF CARE SCREENING TOOL     IDENTIFICATION  Patient Name: [name] Birthdate: 11-Mar-1934 Sex: male Admission Date (Current Location): 08/28/2015  [facility] and [place] Number:  Herbalist and Address:  The . [facility], [place] [address] [place], [place] [address]      Provider Number: [id number]  Attending Physician Name and Address:  Debbe Odea, MD  Relative Name and Phone Number:       Current Level of Care: Hospital Recommended Level of Care: [place] Prior Approval Number:    Date Approved/Denied:   PASRR Number: [id number]  Discharge Plan: SNF    Current Diagnoses: Patient Active Problem List   Diagnosis Date Noted  . Atrial flutter ([place]) 08/29/2015  . Acute on chronic diastolic heart failure ([place]) 08/28/2015  . Hypoxia 08/28/2015  . Atrial fibrillation ([place])   . Diabetes mellitus without complication ([place])   . Diabetes mellitus with complication ([place])   . Noncompliance with medications 08/03/2015  . Chronic diastolic CHF (congestive heart failure) ([place]) 07/31/2015  . Cellulitis of left lower extremity 07/31/2015  . Anemia of chronic renal failure, stage 3 (moderate) 07/31/2015  . Uncontrolled diabetes mellitus with diabetic nephropathy, with long-term current use of insulin ([place]) 07/31/2015  . Accelerated hypertension 07/31/2015  . Thrombocytopenia ([place]) 07/31/2015  . Acute renal failure superimposed on stage 3 chronic kidney disease ([place]) 11/29/2014  . Chronic acquired lymphedema 06/25/2011    Orientation RESPIRATION BLADDER Height & Weight    Self, Time, Situation, Place  Normal Indwelling catheter 5' 10.5" (179.1 cm) 252 lbs.  BEHAVIORAL SYMPTOMS/MOOD NEUROLOGICAL BOWEL NUTRITION STATUS  Other (Comment) (n/a)  (n/a) Continent Diet (Please see discharge summary.)  AMBULATORY STATUS COMMUNICATION OF NEEDS Skin    (pending PT evaluation) Verbally Other (Comment) (Diabetic  wound ulcers on both the L and R leg.)                       Personal Care Assistance Level of Assistance  Bathing, Feeding, Dressing Bathing Assistance: Maximum assistance Feeding assistance: Limited assistance Dressing Assistance: Maximum assistance     Functional Limitations Info   (n/a)          [place]  PT (By licensed PT), OT (By licensed OT)     PT Frequency: 5 OT Frequency: 5            Contractures      Additional Factors Info  Code Status, Allergies Code Status Info: FULL Allergies Info: No known allergies           Current Medications (08/29/2015):  This is the current hospital active medication list Current Facility-Administered Medications  Medication Dose Route Frequency Provider Last Rate Last Dose  . 0.9 %  sodium chloride infusion  250 mL Intravenous PRN Radene Gunning, NP      . acetaminophen (TYLENOL) tablet 650 mg  650 mg Oral Q4H PRN Radene Gunning, NP   650 mg at 08/28/15 2326  . albuterol (PROVENTIL) (2.5 MG/3ML) 0.083% nebulizer solution 2.5 mg  2.5 mg Nebulization Q6H Radene Gunning, NP   2.5 mg at 08/29/15 0726  . apixaban (ELIQUIS) tablet 5 mg  5 mg Oral BID Debbe Odea, MD      . aspirin EC tablet 81 mg  81 mg Oral Daily Radene Gunning, NP   81 mg at 08/29/15 0955  . bisacodyl (DULCOLAX) suppository 10 mg  10 mg  Rectal PRN Lezlie Octave Black, NP      . carvedilol (COREG) tablet 12.5 mg  12.5 mg Oral BID WC Debbe Odea, MD      . ferrous sulfate tablet 325 mg  325 mg Oral BID WC Waldemar Dickens, MD   325 mg at 08/29/15 0954  . insulin aspart (novoLOG) injection 0-20 Units  0-20 Units Subcutaneous TID WC Radene Gunning, NP   4 Units at 08/29/15 [phone]  . insulin aspart (novoLOG) injection 0-5 Units  0-5 Units Subcutaneous QHS Radene Gunning, NP   0 Units at 08/28/15 2200  . insulin glargine (LANTUS) injection 5 Units  5 Units Subcutaneous QHS Radene Gunning, NP   5 Units at 08/28/15 2309  . isosorbide-hydrALAZINE (BIDIL)  20-37.5 MG per tablet 1 tablet  1 tablet Oral TID Radene Gunning, NP   1 tablet at 08/29/15 0955  . magnesium sulfate IVPB 2 g 50 mL  2 g Intravenous Once Debbe Odea, MD      . ondansetron (ZOFRAN) injection 4 mg  4 mg Intravenous Q6H PRN Radene Gunning, NP      . oxyCODONE-acetaminophen (PERCOCET/ROXICET) 5-325 MG per tablet 1 tablet  1 tablet Oral Q4H PRN Rhetta Mura Schorr, NP      . oxyCODONE-acetaminophen (PERCOCET/ROXICET) 5-325 MG per tablet 2 tablet  2 tablet Oral Once Jeryl [place], NP      . sodium chloride 0.9 % injection 3 mL  3 mL Intravenous Q12H Lezlie Octave Black, NP   3 mL at 08/29/15 1000  . sodium chloride 0.9 % injection 3 mL  3 mL Intravenous PRN Radene Gunning, NP         Discharge Medications: Please see discharge summary for a list of discharge medications.  Relevant Imaging Results:  Relevant Lab Results:   Additional Information SSN: [id number]  Luna Kitchens [phone]

## 2015-08-29 NOTE — Progress Notes (Signed)
Pt is refusing SNF and is interested in home health options  CSW informed RNCM of pt decision to return home and attempted to inform pt dtr with permission from pt- was unable to contact or leave message.  CSW signing off at this time- please reconsult if needed  Domenica Reamer, [place] Social Worker [phone]

## 2015-08-29 NOTE — Progress Notes (Signed)
Orthopedic Tech Progress Note Patient Details:  [name] November 22, 1933 [id number]  Ortho Devices Type of Ortho Device: Louretta [place] boot Ortho Device/Splint Location: bilateral Ortho Device/Splint Interventions: Application   Ersel Enslin 08/29/2015, 10:27 AM
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
## 2015-08-29 NOTE — Progress Notes (Addendum)
[facility] Progress Note   [name]  [id number]  DOB: [birth year]  DOA: 08/28/2015 PCP: Philis Fendt, MD  Brief narrative: [name] is a 82 y.o. male with a history of insulin-dependent diabetes mellitus, chronic diastolic heart failure, hypertension, severe venous stasis/ lymphedema, colon cancer in 2015 s/p surgery who presents to the hospital for worsening of his pedal edema. Also found to have a very congested cough. Chest x-ray suggestive of pneumonia versus heart failure. Started on IV diuretics and admitted to the hospital.  He is known to be chronically non-compliant with medications at home. Discharged home on 12/10 after being treated for lower extremity cellulitis.    Subjective: Continues to have a cough. States that his legs are "not as bad as they look". Refusing to go back home to live with his daughter and would like [place] to find a place for him to live but not a nursing home.   Assessment/Plan: Principal Problem:  Cough- dyspnea on exertion- leukocytosis -- with pink sputum and low grade fevers- pneumonia? CXR difficult to interpret- exam difficult due to morbid obesity and loud rhonchi --cannot get CT with contrast to r/o PE due to CKD- will obtain CT chest to determine if this is a pneumonia or CHF- diuretics have actually made his creatinine worse and although he has pedal edema, he may not have pulm edema - obtain D dimer - if elevated, may need VQ - starting on Apixaban anyway for below mentioned a-flutter ADDENDUM: CT reveal b/l pneumonia- will start Vanc and Zosyn for HCAP as he was in the hospital last month. - no sign of CHF therefore will hold diuretics. Check Strep and Legionella antigen.   Active Problems: A-flutter- new issue? - I cannot see that he has a history of this- have reviewed numerous past EKGs - rate currently controlled- can give apixaban in the hospital - no certainty of his compliance if he goes home on it- may change  to once a day Xarelto? - will get PT eval to assess fall risk - order ECHO - check Thyroid functions    Cellulitis of left lower extremity?? Severe lymphedema   - Keflex on med rec- patient is a poor historian thereofore not sure if he was taking it - I don't see any cellulitis at this time- hold off on continuing Keflex and follow clinically - keep legs elevated  Acute vs chronic dCHF - last ECHO on 4/16 showing gr 2 dias dysfunction and normal EF - will determine need for further diuretics after above CT   ARF On CKD 3-4 - follow CT chest- would not diurese aggressively if there is no pulm edema    Anemia of chronic renal failure, stage 3 (moderate) - follow- cont oral Iron    Uncontrolled diabetes mellitus with diabetic nephropathy, with long-term current use of insulin  -cont Lantus and ISS and follow sugars    Accelerated hypertension - cont Coreg  - added Bidil      Thrombocytopenia - chronic  Colon cancer - need surveillance colonoscopy per GI   Antibiotics: Anti-infectives    None     Code Status:     Code Status Orders        Start     Ordered   08/28/15 1732  Full code   Continuous     08/28/15 1731     Family Communication:   Disposition Plan: home vs SNF or ALF DVT prophylaxis: Lovenox Consultants:  none Procedures: none    Objective:  Filed Weights   08/28/15 1719 08/29/15 0425  Weight: 115.214 kg (254 lb) 114.715 kg (252 lb 14.4 oz)    Intake/Output Summary (Last 24 hours) at 08/29/15 1050 Last data filed at 08/29/15 1036  Gross per 24 hour  Intake    770 ml  Output   1900 ml  Net  -1130 ml     Vitals Filed Vitals:   08/29/15 0235 08/29/15 0425 08/29/15 0727 08/29/15 0920  BP:  149/52  161/58  Pulse:  71  77  Temp:  99.8 F (37.7 C)  99.8 F (37.7 C)  TempSrc:  Oral  Oral  Resp:  18  18  Height:      Weight:  114.715 kg (252 lb 14.4 oz)    SpO2: 95% 95% 93% 95%    Exam:  General:  Pt is alert, not in acute  distress  HEENT: No icterus, No thrush, oral mucosa moist  Cardiovascular: regular rate and rhythm, S1/S2 No murmur  Respiratory: congested, rhonchi, no wheezing  Abdomen: Soft, +Bowel sounds, non tender, non distended, no guarding  MSK: No cyanosis or clubbing- severe pedal edema with chronic skin thickening and a few abrasions   Data Reviewed: Basic Metabolic Panel:  Recent Labs Lab 08/28/15 1207 08/28/15 1846 08/29/15 0359  NA 139  --  137  K 4.5  --  4.0  CL 107  --  106  CO2 23  --  24  GLUCOSE 210*  --  167*  BUN 22*  --  24*  CREATININE 1.90*  --  2.41*  CALCIUM 8.7*  --  8.5*  MG  --  1.3*  --   PHOS  --  2.5  --    Liver Function Tests: No results for input(s): AST, ALT, ALKPHOS, BILITOT, PROT, ALBUMIN in the last 168 hours. No results for input(s): LIPASE, AMYLASE in the last 168 hours. No results for input(s): AMMONIA in the last 168 hours. CBC:  Recent Labs Lab 08/28/15 1207  WBC 18.7*  HGB 10.0*  HCT 32.5*  MCV 86.7  PLT 80*   Cardiac Enzymes: No results for input(s): CKTOTAL, CKMB, CKMBINDEX, TROPONINI in the last 168 hours. BNP (last 3 results)  Recent Labs  11/23/14 1551 08/02/15 0510 08/28/15 1207  BNP 978.4* 191.9* 779.2*    ProBNP (last 3 results) No results for input(s): PROBNP in the last 8760 hours.  CBG:  Recent Labs Lab 08/28/15 2223 08/29/15 0529  GLUCAP 155* 157*    No results found for this or any previous visit (from the past 240 hour(s)).   Studies: Dg Chest 2 View  08/28/2015  CLINICAL DATA:  Short of breath EXAM: CHEST  2 VIEW COMPARISON:  11/23/2014 FINDINGS: There is hazy airspace disease in the right mid and lower lung zones. There is opacity at the left costophrenic angle. Cardiomegaly. Left upper lung zone is clear. No pneumothorax. IMPRESSION: Bilateral airspace disease suggesting bilateral bronchopneumonia or asymmetric edema. Electronically Signed   By: Marybelle Killings M.D.   On: 08/28/2015 13:52   Ct Head  Wo Contrast  08/28/2015  CLINICAL DATA:  Headache.  Hypertension. EXAM: CT HEAD WITHOUT CONTRAST TECHNIQUE: Contiguous axial images were obtained from the base of the skull through the vertex without intravenous contrast. COMPARISON:  03/26/2013 FINDINGS: The brainstem, cerebellum, cerebral peduncles, thalami, basal ganglia, basilar cisterns, and ventricular system appear within normal limits. Periventricular white matter and corona radiata hypodensities favor chronic ischemic microvascular white matter disease. No intracranial hemorrhage, mass lesion, or  acute CVA. Chronic left maxillary sinusitis noted with mild chronic ethmoid sinusitis. There is atherosclerotic calcification of the cavernous carotid arteries bilaterally. IMPRESSION: 1. No acute intracranial findings. 2. Periventricular white matter and corona radiata hypodensities favor chronic ischemic microvascular white matter disease. 3. Chronic left maxillary sinusitis and mild chronic ethmoid sinusitis. Electronically Signed   By: Van Clines M.D.   On: 08/28/2015 15:38    Scheduled Meds:  Scheduled Meds: . albuterol  2.5 mg Nebulization Q6H  . aspirin EC  81 mg Oral Daily  . ferrous sulfate  325 mg Oral BID WC  . insulin aspart  0-20 Units Subcutaneous TID WC  . insulin aspart  0-5 Units Subcutaneous QHS  . insulin glargine  5 Units Subcutaneous QHS  . isosorbide-hydrALAZINE  1 tablet Oral TID  . oxyCODONE-acetaminophen  2 tablet Oral Once  . sodium chloride  3 mL Intravenous Q12H   Continuous Infusions:   Time spent on care of this patient: 40 min   [place], MD 08/29/2015, 10:50 AM    [facility] Office  [phone] Pager - Text Page per [email or web address] If 7PM-7AM, please contact night-coverage [email or web address]

## 2015-08-29 NOTE — Progress Notes (Signed)
Pt refused to stand this AM for daily weight, stating he is too weak and sick. PT consult pending. Will continue to monitor.
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
## 2015-08-29 NOTE — Progress Notes (Signed)
  Echocardiogram 2D Echocardiogram has been performed.  Diamond Nickel 08/29/2015, 12:58 PM

## 2015-08-29 NOTE — Progress Notes (Signed)
ANTIBIOTIC CONSULT NOTE - INITIAL  Pharmacy Consult:  Vancomycin / Zosyn Indication:  HCAP  No Known Allergies  Patient Measurements: Height: 5' 10.5" (179.1 cm) Weight: 252 lb 14.4 oz (114.715 kg) (bed scale) IBW/kg (Calculated) : 74.15  Vital Signs: Temp: 98.3 F (36.8 C) (01/03 1155) Temp Source: Oral (01/03 1155) BP: 179/56 mmHg (01/03 1155) Pulse Rate: 67 (01/03 1155) Intake/Output from previous day: 01/02 0701 - 01/03 0700 In: 650 [P.O.:600; IV Piggyback:50] Out: 1650 [Urine:1650] Intake/Output from this shift: Total I/O In: 410 [P.O.:360; IV Piggyback:50] Out: 250 [Urine:250]  Labs:  Recent Labs  08/28/15 1207 08/29/15 0359  WBC 18.7*  --   HGB 10.0*  --   PLT 80*  --   CREATININE 1.90* 2.41*   Estimated Creatinine Clearance: 31.8 mL/min (by C-G formula based on Cr of 2.41). No results for input(s): VANCOTROUGH, VANCOPEAK, VANCORANDOM, GENTTROUGH, GENTPEAK, GENTRANDOM, TOBRATROUGH, TOBRAPEAK, TOBRARND, AMIKACINPEAK, AMIKACINTROU, AMIKACIN in the last 72 hours.   Microbiology: Recent Results (from the past 720 hour(s))  Wound culture     Status: None   Collection Time: 07/31/15 12:45 PM  Result Value Ref Range Status   Specimen Description LEG LEFT  Final   Special Requests NONE  Final   Gram Stain   Final    RARE WBC PRESENT, PREDOMINANTLY PMN NO SQUAMOUS EPITHELIAL CELLS SEEN ABUNDANT GRAM POSITIVE COCCI IN PAIRS Performed at [facility]    Culture   Final    ABUNDANT STAPHYLOCOCCUS AUREUS Note: RIFAMPIN AND GENTAMICIN SHOULD NOT BE USED AS SINGLE DRUGS FOR TREATMENT OF STAPH INFECTIONS. Performed at [facility]    Report Status 08/03/2015 FINAL  Final   Organism ID, Bacteria STAPHYLOCOCCUS AUREUS  Final      Susceptibility   Staphylococcus aureus - MIC*    CLINDAMYCIN <=0.25 SENSITIVE Sensitive     ERYTHROMYCIN <=0.25 SENSITIVE Sensitive     GENTAMICIN <=0.5 SENSITIVE Sensitive     LEVOFLOXACIN <=0.12 SENSITIVE Sensitive      OXACILLIN 0.5 SENSITIVE Sensitive     RIFAMPIN <=0.5 SENSITIVE Sensitive     TRIMETH/SULFA <=10 SENSITIVE Sensitive     VANCOMYCIN 1 SENSITIVE Sensitive     TETRACYCLINE <=1 SENSITIVE Sensitive     MOXIFLOXACIN <=0.25 SENSITIVE Sensitive     * ABUNDANT STAPHYLOCOCCUS AUREUS  Blood Cultures x 2 sites     Status: None   Collection Time: 07/31/15  4:37 PM  Result Value Ref Range Status   Specimen Description   Final    BLOOD RIGHT ARM Performed at [place] Requests   Final    BOTTLES DRAWN AEROBIC AND ANAEROBIC 5CC Performed at [place]   Final    NO GROWTH 5 DAYS Performed at [facility]    Report Status 08/05/2015 FINAL  Final  Blood Cultures x 2 sites     Status: None   Collection Time: 07/31/15  9:14 PM  Result Value Ref Range Status   Specimen Description BLOOD RIGHT ANTECUBITAL  Final   Special Requests BOTTLES DRAWN AEROBIC AND ANAEROBIC 10CC  Final   Culture   Final    NO GROWTH 5 DAYS Performed at [facility]    Report Status 08/06/2015 FINAL  Final    Medical History: Past Medical History  Diagnosis Date  . CHF (congestive heart failure) ([place])   . Diabetes mellitus without complication ([place])   . Hypertension   . Coronary artery disease   .  Atrial fibrillation ([place])   . Chronic kidney disease       Assessment: 14 YOM admitted with worsening LE edema, SOB and headache.  Now to start vancomycin and Zosyn for HCAP.  Patient has CKD.   Goal of Therapy:  Vancomycin trough level 15-20 mcg/ml   Plan:  - Vanc 2000mg  IV x 1, then 1500mg  IV Q48H - Zosyn 3.375gm IV Q8H, 4 hr infusion - Monitor renal fxn, clinical progress, vanc trough as indicated    Samari Gorby D. Mina Marble, PharmD, BCPS Pager:  [phone] 08/29/2015, 3:14 PM

## 2015-08-29 NOTE — Consult Note (Signed)
WOC wound consult note Reason for Consult: Consult requested for bilat legs.  Pt was admitted with Una boots intact to both legs.  He is unable to provide any information on who follows him as an outpatient or if they are changed once or twice a week. Wound type: Generalized edema and skin changes consistent with venous stasis changes bilat, darker colored and dry peeling skin to bilat calves. Measurement:Left calf with partial thickness venous stasis ulcer, 1X5X.1cm, pink and dry, no odor or drainage. Right calf with partial thickness venous stasis ulcer, patchy areas of partial thickness skin loss to anterior calf area, approx 5X5X.1cm, darker colored skin with patchy areas of partial thickness skin loss.  Dressing procedure/placement/frequency: Continue present plan of care with Una boots to BLE to control edema and promote healing.  Ortho tech called to apply and change Q Tuesday. Please re-consult if further assistance is needed.  Thank-you,  Julien Girt MSN, [place], [place], [place], [place]

## 2015-08-30 DIAGNOSIS — D631 Anemia in chronic kidney disease: Secondary | ICD-10-CM

## 2015-08-30 DIAGNOSIS — I10 Essential (primary) hypertension: Secondary | ICD-10-CM

## 2015-08-30 DIAGNOSIS — N183 Chronic kidney disease, stage 3 (moderate): Secondary | ICD-10-CM

## 2015-08-30 DIAGNOSIS — I5033 Acute on chronic diastolic (congestive) heart failure: Secondary | ICD-10-CM

## 2015-08-30 LAB — BASIC METABOLIC PANEL
Anion gap: 8 (ref 5–15)
BUN: 32 mg/dL — AB (ref 6–20)
CHLORIDE: 104 mmol/L (ref 101–111)
CO2: 24 mmol/L (ref 22–32)
CREATININE: 3.1 mg/dL — AB (ref 0.61–1.24)
Calcium: 8.1 mg/dL — ABNORMAL LOW (ref 8.9–10.3)
GFR calc Af Amer: 20 mL/min — ABNORMAL LOW (ref >60)
GFR calc non Af Amer: 18 mL/min — ABNORMAL LOW (ref >60)
GLUCOSE: 166 mg/dL — AB (ref 65–99)
POTASSIUM: 4.2 mmol/L (ref 3.5–5.1)
SODIUM: 136 mmol/L (ref 135–145)

## 2015-08-30 LAB — GLUCOSE, CAPILLARY
GLUCOSE-CAPILLARY: 131 mg/dL — AB (ref 65–99)
GLUCOSE-CAPILLARY: 142 mg/dL — AB (ref 65–99)
GLUCOSE-CAPILLARY: 135 mg/dL — AB (ref 65–99)
Glucose-Capillary: 144 mg/dL — ABNORMAL HIGH (ref 65–99)
Glucose-Capillary: 141 mg/dL — ABNORMAL HIGH (ref 65–99)

## 2015-08-30 LAB — CBC
HEMATOCRIT: 25.1 % — AB (ref 39.0–52.0)
Hemoglobin: 8 g/dL — ABNORMAL LOW (ref 13.0–17.0)
MCH: 27.6 pg (ref 26.0–34.0)
MCHC: 31.9 g/dL (ref 30.0–36.0)
MCV: 86.6 fL (ref 78.0–100.0)
PLATELETS: 62 10*3/uL — AB (ref 150–400)
RBC: 2.9 MIL/uL — ABNORMAL LOW (ref 4.22–5.81)
RDW: 16.1 % — AB (ref 11.5–15.5)
WBC: 15.4 10*3/uL — ABNORMAL HIGH (ref 4.0–10.5)

## 2015-08-30 LAB — EXPECTORATED SPUTUM ASSESSMENT W REFEX TO RESP CULTURE: SPECIAL REQUESTS: NORMAL

## 2015-08-30 LAB — EXPECTORATED SPUTUM ASSESSMENT W GRAM STAIN, RFLX TO RESP C

## 2015-08-30 MED ORDER — ALBUTEROL SULFATE (2.5 MG/3ML) 0.083% IN NEBU
2.5000 mg | INHALATION_SOLUTION | RESPIRATORY_TRACT | Status: DC
  Administered 2015-08-30 (×2): 2.5 mg via RESPIRATORY_TRACT
  Filled 2015-08-30 (×5): qty 3

## 2015-08-30 NOTE — NC FL2 (Signed)
[place] LEVEL OF CARE SCREENING TOOL     IDENTIFICATION  Patient Name: [name] Birthdate: August 31, 1933 Sex: male Admission Date (Current Location): 08/28/2015  [facility] and [place] Number:  Herbalist and Address:  [facility], [place] [address] [place], [place] [address]      Provider Number: [id number]  Attending Physician Name and Address:  Mendel Corning, MD  Relative Name and Phone Number:       Current Level of Care: Hospital Recommended Level of Care: [place] Prior Approval Number:    Date Approved/Denied:   PASRR Number: [id number]  Discharge Plan: SNF    Current Diagnoses: Patient Active Problem List   Diagnosis Date Noted  . Atrial flutter ([place]) 08/29/2015  . Acute respiratory failure ([place]) 08/29/2015  . Acute on chronic diastolic heart failure ([place]) 08/28/2015  . Hypoxia 08/28/2015  . Atrial fibrillation ([place])   . Diabetes mellitus without complication ([place])   . Diabetes mellitus with complication ([place])   . Noncompliance with medications 08/03/2015  . Chronic diastolic CHF (congestive heart failure) ([place]) 07/31/2015  . Cellulitis of left lower extremity 07/31/2015  . Anemia of chronic renal failure, stage 3 (moderate) 07/31/2015  . Uncontrolled diabetes mellitus with diabetic nephropathy, with long-term current use of insulin ([place]) 07/31/2015  . Accelerated hypertension 07/31/2015  . Thrombocytopenia ([place]) 07/31/2015  . Acute renal failure superimposed on stage 3 chronic kidney disease ([place]) 11/29/2014  . Chronic acquired lymphedema 06/25/2011    Orientation RESPIRATION BLADDER Height & Weight    Self, Time, Situation, Place  Normal Indwelling catheter 5' 10.5" (179.1 cm) 252 lbs.  BEHAVIORAL SYMPTOMS/MOOD NEUROLOGICAL BOWEL NUTRITION STATUS  Other (Comment) (n/a)  (n/a) Continent Diet (Please see discharge summary.)  AMBULATORY STATUS COMMUNICATION OF NEEDS Skin    (pending PT  evaluation) Verbally Other (Comment) (Diabetic wound ulcers on both the L and R leg.)                       Personal Care Assistance Level of Assistance  Bathing, Feeding, Dressing Bathing Assistance: Maximum assistance Feeding assistance: Limited assistance Dressing Assistance: Maximum assistance     Functional Limitations Info   (n/a)          [place]  PT (By licensed PT), OT (By licensed OT)     PT Frequency: 5 OT Frequency: 5            Contractures      Additional Factors Info  Code Status, Allergies Code Status Info: FULL Allergies Info: No known allergies           Current Medications (08/30/2015):  This is the current hospital active medication list Current Facility-Administered Medications  Medication Dose Route Frequency Provider Last Rate Last Dose  . 0.9 %  sodium chloride infusion  250 mL Intravenous PRN Radene Gunning, NP      . acetaminophen (TYLENOL) tablet 650 mg  650 mg Oral Q4H PRN Radene Gunning, NP   650 mg at 08/28/15 2326  . albuterol (PROVENTIL) (2.5 MG/3ML) 0.083% nebulizer solution 2.5 mg  2.5 mg Nebulization Q4H Ripudeep K Rai, MD   2.5 mg at 08/30/15 1613  . apixaban (ELIQUIS) tablet 5 mg  5 mg Oral BID Debbe Odea, MD   5 mg at 08/30/15 1007  . aspirin EC tablet 81 mg  81 mg Oral Daily Radene Gunning, NP   81 mg at 08/30/15  1006  . bisacodyl (DULCOLAX) suppository 10 mg  10 mg Rectal PRN Radene Gunning, NP      . ferrous sulfate tablet 325 mg  325 mg Oral BID WC Waldemar Dickens, MD   325 mg at 08/30/15 1712  . insulin aspart (novoLOG) injection 0-20 Units  0-20 Units Subcutaneous TID WC Radene Gunning, NP   3 Units at 08/30/15 1716  . insulin aspart (novoLOG) injection 0-5 Units  0-5 Units Subcutaneous QHS Radene Gunning, NP   0 Units at 08/28/15 2200  . insulin glargine (LANTUS) injection 5 Units  5 Units Subcutaneous QHS Radene Gunning, NP   5 Units at 08/30/15 0031  . isosorbide-hydrALAZINE (BIDIL) 20-37.5 MG per  tablet 1 tablet  1 tablet Oral TID Radene Gunning, NP   1 tablet at 08/30/15 1712  . ondansetron (ZOFRAN) injection 4 mg  4 mg Intravenous Q6H PRN Radene Gunning, NP      . oxyCODONE-acetaminophen (PERCOCET/ROXICET) 5-325 MG per tablet 1 tablet  1 tablet Oral Q4H PRN Rhetta Mura Schorr, NP      . oxyCODONE-acetaminophen (PERCOCET/ROXICET) 5-325 MG per tablet 2 tablet  2 tablet Oral Once Jeryl [place], NP      . piperacillin-tazobactam (ZOSYN) IVPB 3.375 g  3.375 g Intravenous 3 times per day Thuy D Dang, RPH   3.375 g at 08/30/15 1517  . sodium chloride 0.9 % injection 3 mL  3 mL Intravenous Q12H Lezlie Octave Black, NP   3 mL at 08/30/15 1000  . sodium chloride 0.9 % injection 3 mL  3 mL Intravenous PRN Radene Gunning, NP      . Derrill Memo ON 08/31/2015] vancomycin (VANCOCIN) 1,500 mg in sodium chloride 0.9 % 500 mL IVPB  1,500 mg Intravenous Q48H Tyrone Apple, [facility]         Discharge Medications: Please see discharge summary for a list of discharge medications.  Relevant Imaging Results:  Relevant Lab Results:   Additional Information SSN: [id number]  Williemae Area, LCSW

## 2015-08-30 NOTE — Progress Notes (Signed)
Patient had adamantly refused SNF placement with this CSW and another CSW yesterday Jenna. LCSWA.   He planned to go to a local hotel to stay as he adamantly refuses to return home with his daughter.  Patient is very concerned about his wallet, VISA card and cell phone which are in his daughter's possession and patient gave CSW daughter's phone number Leland [phone]. CSW spoke to daughter who stated she was working but will bring patient's belongings to the hospital this evening and leave them at the nurses desk. She does not plan to meet with her father.  CSW received call from unit [facility] stating that after talking with patient- he now agrees to SNF placement. FL2 has been sent to areas SNF's for review and active bed search is in place.  Lorie Phenix. Pauline Good, [place]

## 2015-08-30 NOTE — Progress Notes (Addendum)
[facility]                                                                              Patient Demographics  [name], is a 82 y.o. male, DOB - 29-Oct-1933, [id number]  Admit date - 08/28/2015   Admitting Physician Waldemar Dickens, MD  Outpatient Primary MD for the patient is Philis Fendt, MD  LOS - 1   Chief Complaint  Patient presents with  . Hypertension  . Headache       Brief HPI   [name] is a 82 y.o. male with a history of insulin-dependent diabetes mellitus, chronic diastolic heart failure, hypertension, severe venous stasis/ lymphedema, colon cancer in 2015 s/p surgery who presents to the hospital for worsening of his pedal edema. Also found to have a very congested cough. Chest x-ray suggestive of pneumonia versus heart failure. Started on IV diuretics and admitted to the hospital.  He is known to be chronically non-compliant with medications at home. Discharged home on 12/10 after being treated for lower extremity cellulitis.    Assessment & Plan    Principal Problem: Bilateral pneumonia - CT chest showed bilateral pneumonia, started on IV vancomycin and Zosyn for hcap - HIV negative, urine strep antigen negative, respiratory panel pending  Active Problems: A-flutter- new issue? - I cannot see that he has a history of this- have reviewed numerous past EKGs - rate currently controlled- can give apixaban in the hospital - no certainty of his compliance if he goes home on it- may change to once a day Xarelto? - PT recommending skilled nursing facility - Overnight bradycardia issue with heart rate in 30s, discontinued Coreg  - 2-D echo showed EF of 65-70%.    MSSA Cellulitis of left lower extremity?? 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 -Creatinine trending up, hold diuretics   Anemia of chronic renal failure, stage 3 (moderate) - follow- cont oral Iron   Uncontrolled diabetes mellitus with diabetic nephropathy, with long-term current use of insulin  -cont Lantus and sliding scale insulin    Accelerated hypertension - cont Coreg - added Bidil    Thrombocytopenia - chronic  Colon cancer - need surveillance colonoscopy per GI   Code Status: full CODE STATUS  Family Communication: Discussed in detail with the patient, all imaging results, lab results explained to the patient    Disposition Plan:  Unclear, patient does not want to live with the daughter, needs to be in skilled nursing facility, refuses  Time Spent in minutes  25 minutes  Procedures  2-D echo CT chest CT head  Consults   none  DVT Prophylaxis  apixaban Medications  Scheduled Meds: . albuterol  2.5 mg Nebulization Q4H  . apixaban  5 mg Oral BID  . aspirin EC  81 mg Oral Daily  . carvedilol  12.5 mg Oral BID WC  . ferrous  sulfate  325 mg Oral BID WC  . insulin aspart  0-20 Units Subcutaneous TID WC  . insulin aspart  0-5 Units Subcutaneous QHS  . insulin glargine  5 Units Subcutaneous QHS  . isosorbide-hydrALAZINE  1 tablet Oral TID  . oxyCODONE-acetaminophen  2 tablet Oral Once  . piperacillin-tazobactam (ZOSYN)  IV  3.375 g Intravenous 3 times per day  . sodium chloride  3 mL Intravenous Q12H  . [START ON 08/31/2015] vancomycin  1,500 mg Intravenous Q48H   Continuous Infusions:  PRN Meds:.sodium chloride, acetaminophen, bisacodyl, ondansetron (ZOFRAN) IV, oxyCODONE-acetaminophen, sodium chloride   Antibiotics   Anti-infectives    Start     Dose/Rate Route Frequency Ordered Stop   08/31/15 1700  vancomycin (VANCOCIN) 1,500 mg in sodium chloride 0.9 % 500 mL IVPB     1,500 mg 250 mL/hr over 120 Minutes Intravenous Every 48 hours 08/29/15 1516     08/29/15  1600  piperacillin-tazobactam (ZOSYN) IVPB 3.375 g     3.375 g 12.5 mL/hr over 240 Minutes Intravenous 3 times per day 08/29/15 1516     08/29/15 1530  vancomycin (VANCOCIN) 2,000 mg in sodium chloride 0.9 % 500 mL IVPB     2,000 mg 250 mL/hr over 120 Minutes Intravenous  Once 08/29/15 1516 08/29/15 1821        Subjective:   [name] was seen and examined today.  No new complaints, has faint wheezing with coughing. Per patient legs are improving.  Patient denies dizziness, chest pain, shortness of breath, abdominal pain, N/V/D/C, new weakness, numbess, tingling. No acute events overnight.    Objective:   Blood pressure 120/61, pulse 55, temperature 99.1 F (37.3 C), temperature source Oral, resp. rate 18, height 5' 10.5" (1.791 m), weight 116.7 kg (257 lb 4.4 oz), SpO2 98 %.  Wt Readings from Last 3 Encounters:  08/30/15 116.7 kg (257 lb 4.4 oz)  08/05/15 112.9 kg (248 lb 14.4 oz)  05/18/15 108.863 kg (240 lb)     Intake/Output Summary (Last 24 hours) at 08/30/15 1037 Last data filed at 08/30/15 0819  Gross per 24 hour  Intake   1180 ml  Output    575 ml  Net    605 ml    Exam  General: Alert and oriented x 3, NAD  HEENT:  PERRLA, EOMI, Anicteric Sclera, mucous membranes moist.   Neck: Supple, no JVD, no masses  CVS: S1 S2 auscultated, no rubs, murmurs or gallops. Regular rate and rhythm.  Respiratory: Bilateral rhonchi  Abdomen: Soft, nontender, nondistended, + bowel sounds  Ext: no cyanosis clubbing, pedal edema bilaterally with chronic stasis changes, venous stasis ulcers  Neuro: AAOx3, Cr N's II- XII. Strength 5/5 upper and lower extremities bilaterally  Skin: No rashes  Psych: Normal affect and demeanor, alert and oriented x3    Data Review   Micro Results Recent Results (from the past 240 hour(s))  Culture, expectorated sputum-assessment     Status: None   Collection Time: 08/30/15  1:15 AM  Result Value Ref Range Status   Specimen  Description EXPECTORATED SPUTUM  Final   Special Requests Normal  Final   Sputum evaluation   Final    THIS SPECIMEN IS ACCEPTABLE. RESPIRATORY CULTURE REPORT TO FOLLOW.   Report Status 08/30/2015 FINAL  Final    Radiology Reports Dg Chest 2 View  08/28/2015  CLINICAL DATA:  Short of breath EXAM: CHEST  2 VIEW COMPARISON:  11/23/2014 FINDINGS: There is hazy airspace disease in the right mid  and lower lung zones. There is opacity at the left costophrenic angle. Cardiomegaly. Left upper lung zone is clear. No pneumothorax. IMPRESSION: Bilateral airspace disease suggesting bilateral bronchopneumonia or asymmetric edema. Electronically Signed   By: Marybelle Killings M.D.   On: 08/28/2015 13:52   Dg Tibia/fibula Left  07/31/2015  CLINICAL DATA:  Oozing wound in the distal third of the left tib/ fib region for 1 month. EXAM: LEFT TIBIA AND FIBULA - 2 VIEW COMPARISON:  MRI 01/20/2011 FINDINGS: Remote proximal tibial and fibular diaphysis fracture status post ORIF at the tibia. Fractures are healed. There is diffuse subcutaneous reticulation, greatest at the calf. No soft tissue gas or opaque foreign body (when accounting for bandage). No evidence of osteomyelitis. Knee and tibiotalar osteoarthritis with joint narrowing and spurring. Osteopenia. IMPRESSION: Diffuse leg swelling without soft tissue gas or acute osseous finding. Electronically Signed   By: Monte Fantasia M.D.   On: 07/31/2015 12:07   Ct Head Wo Contrast  08/28/2015  CLINICAL DATA:  Headache.  Hypertension. EXAM: CT HEAD WITHOUT CONTRAST TECHNIQUE: Contiguous axial images were obtained from the base of the skull through the vertex without intravenous contrast. COMPARISON:  03/26/2013 FINDINGS: The brainstem, cerebellum, cerebral peduncles, thalami, basal ganglia, basilar cisterns, and ventricular system appear within normal limits. Periventricular white matter and corona radiata hypodensities favor chronic ischemic microvascular white matter disease.  No intracranial hemorrhage, mass lesion, or acute CVA. Chronic left maxillary sinusitis noted with mild chronic ethmoid sinusitis. There is atherosclerotic calcification of the cavernous carotid arteries bilaterally. IMPRESSION: 1. No acute intracranial findings. 2. Periventricular white matter and corona radiata hypodensities favor chronic ischemic microvascular white matter disease. 3. Chronic left maxillary sinusitis and mild chronic ethmoid sinusitis. Electronically Signed   By: Van Clines M.D.   On: 08/28/2015 15:38   Ct Chest Wo Contrast  08/29/2015  CLINICAL DATA:  Hemoptysis. Shortness of breath and cough beginning yesterday. EXAM: CT CHEST WITHOUT CONTRAST TECHNIQUE: Multidetector CT imaging of the chest was performed following the standard protocol without IV contrast. COMPARISON:  08/28/2015 chest radiographs. FINDINGS: There is a a borderline enlarged lower right paratracheal lymph node which measures 11 mm in short axis. No enlarged hilar lymph nodes are identified on this unenhanced study. Three vessel coronary artery and prominent thoracic aortic calcification are noted. The heart is mildly enlarged. There are small bilateral pleural effusions. There are patchy consolidative and ground-glass opacities in the right upper lobe, right middle lobe, and both lower lobes, much of which is peribronchovascular in distribution. The central airways are patent. A 2.5 cm calcified stone is noted in the gallbladder. Thoracic spondylosis is noted. IMPRESSION: 1. Patchy right upper, right middle, and bilateral lower lobe opacities compatible with multifocal pneumonia. 2. Small bilateral pleural effusions. 3. Cholelithiasis. Electronically Signed   By: Logan Bores M.D.   On: 08/29/2015 13:55    CBC  Recent Labs Lab 08/28/15 1207 08/30/15 0315  WBC 18.7* 15.4*  HGB 10.0* 8.0*  HCT 32.5* 25.1*  PLT 80* 62*  MCV 86.7 86.6  MCH 26.7 27.6  MCHC 30.8 31.9  RDW 15.2 16.1*    Chemistries    Recent Labs Lab 08/28/15 1207 08/28/15 1846 08/29/15 0359 08/30/15 0315  NA 139  --  137 136  K 4.5  --  4.0 4.2  CL 107  --  106 104  CO2 23  --  24 24  GLUCOSE 210*  --  167* 166*  BUN 22*  --  24* 32*  CREATININE  1.90*  --  2.41* 3.10*  CALCIUM 8.7*  --  8.5* 8.1*  MG  --  1.3*  --   --    ------------------------------------------------------------------------------------------------------------------ estimated creatinine clearance is 24.9 mL/min (by C-G formula based on Cr of 3.1). ------------------------------------------------------------------------------------------------------------------ No results for input(s): HGBA1C in the last 72 hours. ------------------------------------------------------------------------------------------------------------------ No results for input(s): CHOL, HDL, LDLCALC, TRIG, CHOLHDL, LDLDIRECT in the last 72 hours. ------------------------------------------------------------------------------------------------------------------  Recent Labs  08/29/15 1135  TSH 0.931   ------------------------------------------------------------------------------------------------------------------ No results for input(s): VITAMINB12, FOLATE, FERRITIN, TIBC, IRON, RETICCTPCT in the last 72 hours.  Coagulation profile No results for input(s): INR, PROTIME in the last 168 hours.  No results for input(s): DDIMER in the last 72 hours.  Cardiac Enzymes No results for input(s): CKMB, TROPONINI, MYOGLOBIN in the last 168 hours.  Invalid input(s): CK ------------------------------------------------------------------------------------------------------------------ Invalid input(s): [place]  08/29/15 0529 08/29/15 1101 08/29/15 1610 08/29/15 2232 08/30/15 0030 08/30/15 0605  GLUCAP 157* 158* 155* 161* 144* 141*     Kennedey Digilio M.D. [facility] 08/30/2015, 10:37 AM  Pager: [phone] Between 7am to 7pm - call Pager -  [phone]  After 7pm go to [email or web address] - password [email or web address]  Call night coverage person covering after 7pm

## 2015-08-30 NOTE — Clinical Social Work Placement (Signed)
   CLINICAL SOCIAL WORK PLACEMENT  NOTE  Date:  08/30/2015  Patient Details  Name: Jo Sellards MRN: [id number] Date of Birth: [birth year]  Clinical Social Work is seeking post-discharge placement for this patient at the [place] level of care (*CSW will initial, date and re-position this form in  chart as items are completed):  Yes   Patient/family provided with [facility] Work Department's list of facilities offering this level of care within the geographic area requested by the patient (or if unable, by the patient's family).  Yes   Patient/family informed of their freedom to choose among providers that offer the needed level of care, that participate in Medicare, Medicaid or managed care program needed by the patient, have an available bed and are willing to accept the patient.  Yes   Patient/family informed of [facility]'s ownership interest in [facility] and [facility], as well as of the fact that they are under no obligation to receive care at these facilities.  PASRR submitted to EDS on       PASRR number received on       Existing PASRR number confirmed on 08/30/15     FL2 transmitted to all facilities in geographic area requested by pt/family on 08/30/15     FL2 transmitted to all facilities within larger geographic area on       Patient informed that his/her managed care company has contracts with or will negotiate with certain facilities, including the following:            Patient/family informed of bed offers received.  Patient chooses bed at       Physician recommends and patient chooses bed at      Patient to be transferred to   on  .  Patient to be transferred to facility by Ambulance [facility])     Patient family notified on   of transfer.  Name of family member notified:        PHYSICIAN Please prepare priority discharge summary, including medications, Please sign FL2, Please prepare prescriptions      Additional Comment:    _______________________________________________ Williemae Area, LCSW 08/30/2015, 5:20 PM

## 2015-08-30 NOTE — Clinical Documentation Improvement (Signed)
Internal Medicine  WOC RN consult documented venous stasis ulcer ulcers to left calf and right calf.  Please document in your progress note and discharge summary if you agree with the assessment by the [facility] RN   Left and right calf venous stasis ulcers (non-pressure ulcers)  Other  Unable to clinically determine  Please exercise your independent, professional judgment when responding. A specific answer is not anticipated or expected.  Thank you, Mateo Flow, RN [phone] Clinical Documentation Specialist

## 2015-08-30 NOTE — Evaluation (Signed)
Occupational Therapy Evaluation Patient Details Name: [name] MRN: [id number] DOB: 1933/11/05 Today's Date: 08/30/2015    History of Present Illness 82 yo male with onset of CHF and possible PNA, LLE cellulitis was admitted for wound care and referred to rehab.  PMHx:  CKD, ,a-fib, CHF   Clinical Impression   Pt admitted with above. He demonstrates the below listed deficits and will benefit from continued OT to maximize safety and independence with BADLs.  Pt presents to OT with generalized weakness and what appears to be long standing impaired safety/judgement, and chronic medical issues with recent readmission.   Pt initially insisting he cannot discharge to SNF, but after long discussion with CM and myself, he is agreeable to SNF at discharge.  Currently, he requires min A for ADLs.       Follow Up Recommendations  SNF    Equipment Recommendations  None recommended by OT    Recommendations for Other Services       Precautions / Restrictions Precautions Precautions: Fall Precaution Comments: pt reports frequent falls       Mobility Bed Mobility                  Transfers Overall transfer level: Needs assistance Equipment used: Rolling walker (2 wheeled);1 person hand held assist Transfers: Sit to/from Stand;Stand Pivot Transfers Sit to Stand: Min guard Stand pivot transfers: Min guard            Balance Overall balance assessment: Needs assistance Sitting-balance support: Feet supported Sitting balance-Leahy Scale: Fair     Standing balance support: Bilateral upper extremity supported;Single extremity supported;During functional activity Standing balance-Leahy Scale: Poor                              ADL Overall ADL's : Needs assistance/impaired Eating/Feeding: Independent   Grooming: Wash/dry hands;Wash/dry face;Oral care;Min guard;Standing   Upper Body Bathing: Set up;Sitting   Lower Body Bathing: Minimal assistance;Sit  to/from stand   Upper Body Dressing : Set up;Sitting   Lower Body Dressing: Minimal assistance;Sit to/from stand   Toilet Transfer: Min guard;Ambulation;BSC;RW   Toileting- Water quality scientist and Hygiene: Min guard;Sit to/from stand       Functional mobility during ADLs: Min guard       Vision     Perception     Praxis      Pertinent Vitals/Pain Pain Assessment: No/denies pain     Hand Dominance Right   Extremity/Trunk Assessment Upper Extremity Assessment Upper Extremity Assessment: Generalized weakness   Lower Extremity Assessment Lower Extremity Assessment: Defer to PT evaluation   Cervical / Trunk Assessment Cervical / Trunk Assessment: Normal   Communication Communication Communication: No difficulties   Cognition Arousal/Alertness: Awake/alert Behavior During Therapy: WFL for tasks assessed/performed Overall Cognitive Status: No family/caregiver present to determine baseline cognitive functioning (impaired judgment )                     General Comments       Exercises       Shoulder Instructions      Home Living Family/patient expects to be discharged to:: Skilled nursing facility Living Arrangements: Children                               Additional Comments: Pt initially stating he would not consider SNF, but after working with OT, states he feels weaker than  he thought and will consider SNF       Prior Functioning/Environment Level of Independence: Needs assistance  Gait / Transfers Assistance Needed: rollator and has been increasingly unsteady ADL's / Homemaking Assistance Needed: reports that he is independent but also states difficulty getting around recently, as well as history of falls        OT Diagnosis: Generalized weakness   OT Problem List: Decreased strength;Decreased activity tolerance;Impaired balance (sitting and/or standing);Decreased safety awareness   OT Treatment/Interventions: Self-care/ADL  training;DME and/or AE instruction;Therapeutic activities;Patient/family education;Balance training    OT Goals(Current goals can be found in the care plan section) Acute Rehab OT Goals Patient Stated Goal: to get stronger  OT Goal Formulation: With patient Time For Goal Achievement: 09/13/15 Potential to Achieve Goals: Good ADL Goals Pt Will Perform Grooming: with modified independence;standing Pt Will Perform Upper Body Bathing: with modified independence;sitting;standing Pt Will Perform Lower Body Bathing: with modified independence;sit to/from stand Pt Will Perform Upper Body Dressing: with modified independence;sitting Pt Will Perform Lower Body Dressing: with modified independence;sit to/from stand Pt Will Transfer to Toilet: with modified independence;ambulating;regular height toilet;bedside commode Pt Will Perform Toileting - Clothing Manipulation and hygiene: with modified independence;sit to/from stand  OT Frequency: Min 2X/week   Barriers to D/C: Decreased caregiver support          Co-evaluation              End of Session Equipment Utilized During Treatment: Rolling walker Nurse Communication: Mobility status  Activity Tolerance: Patient tolerated treatment well Patient left: in chair;with call bell/phone within reach   Time: 1417-1506 OT Time Calculation (min): 49 min Charges:  OT General Charges $OT Visit: 1 Procedure OT Evaluation $OT Eval Moderate Complexity: 1 Procedure G-Codes:    Matilde Pottenger M September 08, 2015, 3:34 PM

## 2015-08-30 NOTE — Progress Notes (Signed)
Patient's daughter Sondra Barges) came to hospital and gave nursing staff patient's belongings. Nursing staff gave belongings to patient.

## 2015-08-31 LAB — CBC
HCT: 25.9 % — ABNORMAL LOW (ref 39.0–52.0)
Hemoglobin: 8.1 g/dL — ABNORMAL LOW (ref 13.0–17.0)
MCH: 27.2 pg (ref 26.0–34.0)
MCHC: 31.3 g/dL (ref 30.0–36.0)
MCV: 86.9 fL (ref 78.0–100.0)
PLATELETS: 65 10*3/uL — AB (ref 150–400)
RBC: 2.98 MIL/uL — AB (ref 4.22–5.81)
RDW: 16 % — AB (ref 11.5–15.5)
WBC: 7.2 10*3/uL (ref 4.0–10.5)

## 2015-08-31 LAB — GLUCOSE, CAPILLARY
GLUCOSE-CAPILLARY: 109 mg/dL — AB (ref 65–99)
Glucose-Capillary: 139 mg/dL — ABNORMAL HIGH (ref 65–99)
Glucose-Capillary: 160 mg/dL — ABNORMAL HIGH (ref 65–99)
Glucose-Capillary: 113 mg/dL — ABNORMAL HIGH (ref 65–99)

## 2015-08-31 LAB — BASIC METABOLIC PANEL
Anion gap: 8 (ref 5–15)
BUN: 40 mg/dL — ABNORMAL HIGH (ref 6–20)
CO2: 25 mmol/L (ref 22–32)
Calcium: 8.3 mg/dL — ABNORMAL LOW (ref 8.9–10.3)
Chloride: 104 mmol/L (ref 101–111)
Creatinine, Ser: 3.22 mg/dL — ABNORMAL HIGH (ref 0.61–1.24)
GFR calc Af Amer: 20 mL/min — ABNORMAL LOW (ref >60)
GFR calc non Af Amer: 17 mL/min — ABNORMAL LOW (ref >60)
Glucose, Bld: 118 mg/dL — ABNORMAL HIGH (ref 65–99)
Potassium: 4.4 mmol/L (ref 3.5–5.1)
Sodium: 137 mmol/L (ref 135–145)

## 2015-08-31 LAB — LEGIONELLA ANTIGEN, URINE

## 2015-08-31 MED ORDER — AMOXICILLIN-POT CLAVULANATE 500-125 MG PO TABS
500.0000 mg | ORAL_TABLET | Freq: Two times a day (BID) | ORAL | Status: DC
  Administered 2015-08-31 – 2015-09-02 (×2): 500 mg via ORAL
  Filled 2015-08-31 (×7): qty 1

## 2015-08-31 MED ORDER — SODIUM CHLORIDE 0.9 % IV SOLN
INTRAVENOUS | Status: DC
Start: 2015-08-31 — End: 2015-09-02
  Administered 2015-08-31: 12:00:00 via INTRAVENOUS

## 2015-08-31 MED ORDER — APIXABAN 2.5 MG PO TABS
2.5000 mg | ORAL_TABLET | Freq: Two times a day (BID) | ORAL | Status: DC
  Administered 2015-08-31 – 2015-09-02 (×3): 2.5 mg via ORAL
  Filled 2015-08-31 (×4): qty 1

## 2015-08-31 MED ORDER — ALBUTEROL SULFATE (2.5 MG/3ML) 0.083% IN NEBU: 2.5000 mg | INHALATION_SOLUTION | RESPIRATORY_TRACT | Status: DC | PRN

## 2015-08-31 NOTE — Progress Notes (Signed)
Pt accepted bed at [facility] per MD pt not ready for DC today but possible DC tomorrow  CSW will continue to follow  Domenica Reamer, [place] Social Worker [phone]

## 2015-08-31 NOTE — Progress Notes (Signed)
[facility]                                                                              Patient Demographics  [name], is a 82 y.o. male, DOB - May 19, 1934, [id number]  Admit date - 08/28/2015   Admitting Physician Waldemar Dickens, MD  Outpatient Primary MD for the patient is Philis Fendt, MD  LOS - 2   Chief Complaint  Patient presents with  . Hypertension  . Headache       Brief HPI   [name] is a 82 y.o. male with a history of insulin-dependent diabetes mellitus, chronic diastolic heart failure, hypertension, severe venous stasis/ lymphedema, colon cancer in 2015 s/p surgery who presents to the hospital for worsening of his pedal edema. Also found to have a very congested cough. Chest x-ray suggestive of pneumonia versus heart failure. Started on IV diuretics and admitted to the hospital.  He is known to be chronically non-compliant with medications at home. Discharged home on 12/10 after being treated for lower extremity cellulitis.    Assessment & Plan    Principal Problem: Bilateral pneumonia - CT chest showed bilateral pneumonia, patient was started on IV vancomycin and Zosyn for hcap - HIV negative, urine strep antigen negative, respiratory panel pending - Creatinine trending up, patient tolerating oral diet, will transition to oral Augmentin  Active Problems: A-flutter- new issue?, No prior history of atrial flutter - rate currently controlled- can give apixaban in the hospital - no certainty of his compliance if he goes home on it- may change to once a day Xarelto? For now, apixaban reduced to renal dosing. - PT recommending skilled nursing facility - Beta blocker discontinued due to bradycardia - 2-D echo showed EF of 65-70%.    MSSA Cellulitis of left lower extremity?? 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 -Creatinine trending up, hold diuretics   Anemia of chronic renal failure, stage 3 (moderate) - follow- cont oral Iron   Uncontrolled diabetes mellitus with diabetic nephropathy, with long-term current use of insulin  -cont Lantus and sliding scale insulin    Accelerated hypertension - cont Bidil    Thrombocytopenia - chronic  Colon cancer - need surveillance colonoscopy per GI   Code Status: full CODE STATUS  Family Communication: Discussed in detail with the patient, all imaging results, lab results explained to the patient    Disposition Plan:  Unclear, patient does not want to live with the daughter, needs to be in skilled nursing facility. If creatinine improving tomorrow, DC to skilled nursing facility.   Time Spent in minutes  25 minutes  Procedures  2-D echo CT chest CT head  Consults   none  DVT Prophylaxis  apixaban Medications  Scheduled Meds: . albuterol  2.5 mg Nebulization Q4H  . amoxicillin-clavulanate  500 mg Oral BID WC  . apixaban  2.5 mg Oral  BID  . aspirin EC  81 mg Oral Daily  . ferrous sulfate  325 mg Oral BID WC  . insulin aspart  0-20 Units Subcutaneous TID WC  . insulin aspart  0-5 Units Subcutaneous QHS  . insulin glargine  5 Units Subcutaneous QHS  . isosorbide-hydrALAZINE  1 tablet Oral TID  . oxyCODONE-acetaminophen  2 tablet Oral Once  . sodium chloride  3 mL Intravenous Q12H   Continuous Infusions:  PRN Meds:.sodium chloride, acetaminophen, bisacodyl, ondansetron (ZOFRAN) IV, oxyCODONE-acetaminophen, sodium chloride   Antibiotics   Anti-infectives    Start     Dose/Rate Route Frequency Ordered Stop   08/31/15 1700  vancomycin (VANCOCIN) 1,500 mg in sodium chloride 0.9 % 500 mL IVPB  Status:  Discontinued     1,500 mg 250 mL/hr over 120 Minutes Intravenous Every 48 hours 08/29/15 1516  08/31/15 1055   08/31/15 1100  amoxicillin-clavulanate (AUGMENTIN) 500-125 MG per tablet 500 mg     500 mg Oral 2 times daily with meals 08/31/15 1056     08/29/15 1600  piperacillin-tazobactam (ZOSYN) IVPB 3.375 g  Status:  Discontinued     3.375 g 12.5 mL/hr over 240 Minutes Intravenous 3 times per day 08/29/15 1516 08/31/15 1055   08/29/15 1530  vancomycin (VANCOCIN) 2,000 mg in sodium chloride 0.9 % 500 mL IVPB     2,000 mg 250 mL/hr over 120 Minutes Intravenous  Once 08/29/15 1516 08/29/15 1821        Subjective:   [name] was seen and examined today.  overall improving, no chest pain or shortness of breath at the time of my encounter.  Patient denies dizziness, abdominal pain, N/V/D/C, new weakness, numbess, tingling. No acute events overnight.    Objective:   Blood pressure 147/77, pulse 47, temperature 98.6 F (37 C), temperature source Oral, resp. rate 18, height 5' 10.5" (1.791 m), weight 115.7 kg (255 lb 1.2 oz), SpO2 99 %.  Wt Readings from Last 3 Encounters:  08/31/15 115.7 kg (255 lb 1.2 oz)  08/05/15 112.9 kg (248 lb 14.4 oz)  05/18/15 108.863 kg (240 lb)     Intake/Output Summary (Last 24 hours) at 08/31/15 1129 Last data filed at 08/31/15 0900  Gross per 24 hour  Intake    580 ml  Output    251 ml  Net    329 ml    Exam  General: Alert and oriented x 3, NAD  HEENT:  PERRLA, EOMI, Anicteric   Neck: Supple, no JVD, no masses  CVS: S1 S2 auscultated, no rubs, murmurs or gallops.RRR  Respiratory: decreased breath sound at the bases otherwise fairly clear  Abdomen: Soft, nontender, nondistended, + bowel sounds  Ext: no cyanosis clubbing, pedal edema b/l with chronic stasis changes, venous stasis ulcers  Neuro: no new deficits   Skin: No rashes  Psych: Normal affect and demeanor, alert and oriented x3    Data Review   Micro Results Recent Results (from the past 240 hour(s))  Culture, expectorated sputum-assessment     Status: None    Collection Time: 08/30/15  1:15 AM  Result Value Ref Range Status   Specimen Description EXPECTORATED SPUTUM  Final   Special Requests Normal  Final   Sputum evaluation   Final    THIS SPECIMEN IS ACCEPTABLE. RESPIRATORY CULTURE REPORT TO FOLLOW.   Report Status 08/30/2015 FINAL  Final  Culture, respiratory (NON-Expectorated)     Status: None (Preliminary result)   Collection Time: 08/30/15  2:27 AM  Result  Value Ref Range Status   Specimen Description EXPECTORATED SPUTUM  Final   Special Requests NONE  Final   Gram Stain   Final    MODERATE WBC PRESENT,BOTH PMN AND MONONUCLEAR RARE SQUAMOUS EPITHELIAL CELLS PRESENT FEW GRAM POSITIVE COCCI IN PAIRS IN CHAINS FEW GRAM NEGATIVE RODS Performed at [facility]    Culture   Final    Culture reincubated for better growth Performed at [facility]    Report Status PENDING  Incomplete    Radiology Reports Dg Chest 2 View  08/28/2015  CLINICAL DATA:  Short of breath EXAM: CHEST  2 VIEW COMPARISON:  11/23/2014 FINDINGS: There is hazy airspace disease in the right mid and lower lung zones. There is opacity at the left costophrenic angle. Cardiomegaly. Left upper lung zone is clear. No pneumothorax. IMPRESSION: Bilateral airspace disease suggesting bilateral bronchopneumonia or asymmetric edema. Electronically Signed   By: Marybelle Killings M.D.   On: 08/28/2015 13:52   Ct Head Wo Contrast  08/28/2015  CLINICAL DATA:  Headache.  Hypertension. EXAM: CT HEAD WITHOUT CONTRAST TECHNIQUE: Contiguous axial images were obtained from the base of the skull through the vertex without intravenous contrast. COMPARISON:  03/26/2013 FINDINGS: The brainstem, cerebellum, cerebral peduncles, thalami, basal ganglia, basilar cisterns, and ventricular system appear within normal limits. Periventricular white matter and corona radiata hypodensities favor chronic ischemic microvascular white matter disease. No intracranial hemorrhage, mass lesion, or acute  CVA. Chronic left maxillary sinusitis noted with mild chronic ethmoid sinusitis. There is atherosclerotic calcification of the cavernous carotid arteries bilaterally. IMPRESSION: 1. No acute intracranial findings. 2. Periventricular white matter and corona radiata hypodensities favor chronic ischemic microvascular white matter disease. 3. Chronic left maxillary sinusitis and mild chronic ethmoid sinusitis. Electronically Signed   By: Van Clines M.D.   On: 08/28/2015 15:38   Ct Chest Wo Contrast  08/29/2015  CLINICAL DATA:  Hemoptysis. Shortness of breath and cough beginning yesterday. EXAM: CT CHEST WITHOUT CONTRAST TECHNIQUE: Multidetector CT imaging of the chest was performed following the standard protocol without IV contrast. COMPARISON:  08/28/2015 chest radiographs. FINDINGS: There is a a borderline enlarged lower right paratracheal lymph node which measures 11 mm in short axis. No enlarged hilar lymph nodes are identified on this unenhanced study. Three vessel coronary artery and prominent thoracic aortic calcification are noted. The heart is mildly enlarged. There are small bilateral pleural effusions. There are patchy consolidative and ground-glass opacities in the right upper lobe, right middle lobe, and both lower lobes, much of which is peribronchovascular in distribution. The central airways are patent. A 2.5 cm calcified stone is noted in the gallbladder. Thoracic spondylosis is noted. IMPRESSION: 1. Patchy right upper, right middle, and bilateral lower lobe opacities compatible with multifocal pneumonia. 2. Small bilateral pleural effusions. 3. Cholelithiasis. Electronically Signed   By: Logan Bores M.D.   On: 08/29/2015 13:55    CBC  Recent Labs Lab 08/28/15 1207 08/30/15 0315 08/31/15 0520  WBC 18.7* 15.4* 7.2  HGB 10.0* 8.0* 8.1*  HCT 32.5* 25.1* 25.9*  PLT 80* 62* 65*  MCV 86.7 86.6 86.9  MCH 26.7 27.6 27.2  MCHC 30.8 31.9 31.3  RDW 15.2 16.1* 16.0*    Chemistries    Recent Labs Lab 08/28/15 1207 08/28/15 1846 08/29/15 0359 08/30/15 0315 08/31/15 0520  NA 139  --  137 136 137  K 4.5  --  4.0 4.2 4.4  CL 107  --  106 104 104  CO2 23  --  24 24 25   GLUCOSE 210*  --  167* 166* 118*  BUN 22*  --  24* 32* 40*  CREATININE 1.90*  --  2.41* 3.10* 3.22*  CALCIUM 8.7*  --  8.5* 8.1* 8.3*  MG  --  1.3*  --   --   --    ------------------------------------------------------------------------------------------------------------------ estimated creatinine clearance is 23.9 mL/min (by C-G formula based on Cr of 3.22). ------------------------------------------------------------------------------------------------------------------ No results for input(s): HGBA1C in the last 72 hours. ------------------------------------------------------------------------------------------------------------------ No results for input(s): CHOL, HDL, LDLCALC, TRIG, CHOLHDL, LDLDIRECT in the last 72 hours. ------------------------------------------------------------------------------------------------------------------  Recent Labs  08/29/15 1135  TSH 0.931   ------------------------------------------------------------------------------------------------------------------ No results for input(s): VITAMINB12, FOLATE, FERRITIN, TIBC, IRON, RETICCTPCT in the last 72 hours.  Coagulation profile No results for input(s): INR, PROTIME in the last 168 hours.  No results for input(s): DDIMER in the last 72 hours.  Cardiac Enzymes No results for input(s): CKMB, TROPONINI, MYOGLOBIN in the last 168 hours.  Invalid input(s): CK ------------------------------------------------------------------------------------------------------------------ Invalid input(s): POCBNP   Recent Labs  08/30/15 0030 08/30/15 0605 08/30/15 1135 08/30/15 1647 08/30/15 2156 08/31/15 0552  GLUCAP 144* 141* 131* 142* 135* 109*     Dezi Schaner M.D. [facility] 08/31/2015, 11:29  AM  Pager: [phone] Between 7am to 7pm - call Pager - [phone]  After 7pm go to [email or web address] - password [email or web address]  Call night coverage person covering after 7pm

## 2015-08-31 NOTE — Progress Notes (Signed)
pt refuse neb, stated it makes him wet. NT at bedside. Pt is stable at this time, no distress noted.

## 2015-09-01 DIAGNOSIS — I482 Chronic atrial fibrillation: Secondary | ICD-10-CM

## 2015-09-01 LAB — BASIC METABOLIC PANEL
ANION GAP: 9 (ref 5–15)
BUN: 41 mg/dL — ABNORMAL HIGH (ref 6–20)
CHLORIDE: 105 mmol/L (ref 101–111)
CO2: 25 mmol/L (ref 22–32)
CREATININE: 3.25 mg/dL — AB (ref 0.61–1.24)
Calcium: 8.5 mg/dL — ABNORMAL LOW (ref 8.9–10.3)
GFR calc non Af Amer: 17 mL/min — ABNORMAL LOW (ref >60)
GFR, EST AFRICAN AMERICAN: 19 mL/min — AB (ref >60)
Glucose, Bld: 112 mg/dL — ABNORMAL HIGH (ref 65–99)
POTASSIUM: 4.5 mmol/L (ref 3.5–5.1)
Sodium: 139 mmol/L (ref 135–145)

## 2015-09-01 LAB — CBC
HEMATOCRIT: 28.3 % — AB (ref 39.0–52.0)
HEMOGLOBIN: 8.7 g/dL — AB (ref 13.0–17.0)
MCH: 26.6 pg (ref 26.0–34.0)
MCHC: 30.7 g/dL (ref 30.0–36.0)
MCV: 86.5 fL (ref 78.0–100.0)
Platelets: 75 10*3/uL — ABNORMAL LOW (ref 150–400)
RBC: 3.27 MIL/uL — AB (ref 4.22–5.81)
RDW: 15.6 % — ABNORMAL HIGH (ref 11.5–15.5)
WBC: 5.1 10*3/uL (ref 4.0–10.5)

## 2015-09-01 LAB — GLUCOSE, CAPILLARY
GLUCOSE-CAPILLARY: 103 mg/dL — AB (ref 65–99)
GLUCOSE-CAPILLARY: 118 mg/dL — AB (ref 65–99)
GLUCOSE-CAPILLARY: 144 mg/dL — AB (ref 65–99)
GLUCOSE-CAPILLARY: 126 mg/dL — AB (ref 65–99)

## 2015-09-01 LAB — CULTURE, RESPIRATORY W GRAM STAIN: Culture: NORMAL

## 2015-09-01 LAB — CULTURE, RESPIRATORY

## 2015-09-01 NOTE — Progress Notes (Signed)
PT Cancellation Note  Patient Details Name: [name] MRN: [id number] DOB: 07/16/1934   Cancelled Treatment:    Reason Eval/Treat Not Completed: Medical issues which prohibited therapy;Patient not medically ready;Other (comment) (Pt refused BP meds, but also not wiling to take for PT).  Will check next week to see if pt more medically stable and willing to take meds.   [name] 09/01/2015, 1:26 PM   Mee Hives, PT MS Acute Rehab Dept. Number: [phone] [id number] and [place] [phone]

## 2015-09-01 NOTE — Progress Notes (Signed)
[facility]                                                                              Patient Demographics  [name], is a 82 y.o. male, DOB - 07-27-1934, [id number]  Admit date - 08/28/2015   Admitting Physician Clovis Fredrickson, MD  Outpatient Primary MD for the patient is Philis Fendt, MD  LOS - 3   Chief Complaint  Patient presents with  . Hypertension  . Headache       Brief HPI   [name] is a 82 y.o. male with a history of insulin-dependent diabetes mellitus, chronic diastolic heart failure, hypertension, severe venous stasis/ lymphedema, colon cancer in 2015 s/p surgery who presents to the hospital for worsening of his pedal edema. Also found to have a very congested cough. Chest x-ray suggestive of pneumonia versus heart failure. Started on IV diuretics and admitted to the hospital.  He is known to be chronically non-compliant with medications at home. Discharged home on 12/10 after being treated for lower extremity cellulitis.    Assessment & Plan    Principal Problem: Bilateral pneumonia - CT chest showed bilateral pneumonia, patient was started on IV vancomycin and Zosyn for hcap - HIV negative, urine strep antigen negative, respiratory panel pending - Transitioned to oral Augmentin on 1/5  Active Problems: A-flutter- new issue?, No prior history of atrial flutter - rate currently controlled- can give apixaban in the hospital - no certainty of his compliance if he goes home on it- may change to once a day Xarelto? For now, apixaban reduced to renal dosing. - PT recommending skilled nursing facility - Beta blocker discontinued due to bradycardia - 2-D echo showed EF of 65-70%.    MSSA Cellulitis of left lower extremity?? 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   Anemia of chronic renal failure, stage 3 (moderate) - follow- cont oral Iron   Uncontrolled diabetes mellitus with diabetic nephropathy, with long-term current use of insulin  -cont Lantus and sliding scale insulin    Accelerated hypertension - cont Bidil    Thrombocytopenia - chronic  Colon cancer - need surveillance colonoscopy per GI   Code Status: full CODE STATUS  Family Communication: Discussed in detail with the patient, all imaging results, lab results explained to the patient    Disposition Plan:  Unclear, patient does not want to live with the daughter, needs to be in skilled nursing facility. If creatinine improving tomorrow, DC to skilled nursing facility.   Time Spent in minutes  15 minutes  Procedures  2-D echo CT chest CT head  Consults   none  DVT Prophylaxis  apixaban Medications  Scheduled Meds: . amoxicillin-clavulanate  500 mg Oral BID WC  .  apixaban  2.5 mg Oral BID  . aspirin EC  81 mg Oral Daily  . ferrous sulfate  325 mg Oral BID WC  . insulin aspart  0-20 Units Subcutaneous TID WC  . insulin aspart  0-5 Units Subcutaneous QHS  . insulin glargine  5 Units Subcutaneous QHS  . isosorbide-hydrALAZINE  1 tablet Oral TID  . oxyCODONE-acetaminophen  2 tablet Oral Once  . sodium chloride  3 mL Intravenous Q12H   Continuous Infusions: . sodium chloride Stopped (08/31/15 1813)   PRN Meds:.sodium chloride, acetaminophen, albuterol, bisacodyl, ondansetron (ZOFRAN) IV, oxyCODONE-acetaminophen, sodium chloride   Antibiotics   Anti-infectives    Start     Dose/Rate Route Frequency Ordered Stop   08/31/15 1700  vancomycin (VANCOCIN) 1,500 mg in sodium chloride  0.9 % 500 mL IVPB  Status:  Discontinued     1,500 mg 250 mL/hr over 120 Minutes Intravenous Every 48 hours 08/29/15 1516 08/31/15 1055   08/31/15 1100  amoxicillin-clavulanate (AUGMENTIN) 500-125 MG per tablet 500 mg     500 mg Oral 2 times daily with meals 08/31/15 1056     08/29/15 1600  piperacillin-tazobactam (ZOSYN) IVPB 3.375 g  Status:  Discontinued     3.375 g 12.5 mL/hr over 240 Minutes Intravenous 3 times per day 08/29/15 1516 08/31/15 1055   08/29/15 1530  vancomycin (VANCOCIN) 2,000 mg in sodium chloride 0.9 % 500 mL IVPB     2,000 mg 250 mL/hr over 120 Minutes Intravenous  Once 08/29/15 1516 08/29/15 1821        Subjective:   [name] was seen and examined today. Overall improving however today somewhat depressed about his home situation. Does not want to live with his daughter again.  Patient denies dizziness, abdominal pain, N/V/D/C, new weakness, numbess, tingling. No acute events overnight.    Objective:   Blood pressure 160/64, pulse 44, temperature 98.5 F (36.9 C), temperature source Oral, resp. rate 16, height 5' 10.5" (1.791 m), weight 114.397 kg (252 lb 3.2 oz), SpO2 96 %.  Wt Readings from Last 3 Encounters:  09/01/15 114.397 kg (252 lb 3.2 oz)  08/05/15 112.9 kg (248 lb 14.4 oz)  05/18/15 108.863 kg (240 lb)     Intake/Output Summary (Last 24 hours) at 09/01/15 1104 Last data filed at 09/01/15 0835  Gross per 24 hour  Intake   1300 ml  Output    700 ml  Net    600 ml    Exam  General: Alert and oriented x 3, NAD  HEENT:  PERRLA, EOMI, Anicteric   Neck: Supple, no JVD, no masses  CVS: S1 S2 auscultated, no rubs, murmurs or gallops.RRR  Respiratory: decreased breath sound at the bases otherwise fairly clear  Abdomen: Soft, nontender, nondistended, + bowel sounds  Ext: no cyanosis clubbing,  legs wrapped  Neuro: no new deficits   Skin: No rashes  Psych: Normal affect and demeanor, alert and oriented x3    Data Review    Micro Results Recent Results (from the past 240 hour(s))  Culture, expectorated sputum-assessment     Status: None   Collection Time: 08/30/15  1:15 AM  Result Value Ref Range Status   Specimen Description EXPECTORATED SPUTUM  Final   Special Requests Normal  Final   Sputum evaluation   Final    THIS SPECIMEN IS ACCEPTABLE. RESPIRATORY CULTURE REPORT TO FOLLOW.   Report Status 08/30/2015 FINAL  Final  Culture, respiratory (NON-Expectorated)     Status: None   Collection Time:  08/30/15  2:27 AM  Result Value Ref Range Status   Specimen Description EXPECTORATED SPUTUM  Final   Special Requests NONE  Final   Gram Stain   Final    MODERATE WBC PRESENT,BOTH PMN AND MONONUCLEAR RARE SQUAMOUS EPITHELIAL CELLS PRESENT FEW GRAM POSITIVE COCCI IN PAIRS IN CHAINS FEW GRAM NEGATIVE RODS Performed at [facility]    Culture   Final    NORMAL OROPHARYNGEAL FLORA Performed at [facility]    Report Status 09/01/2015 FINAL  Final    Radiology Reports Dg Chest 2 View  08/28/2015  CLINICAL DATA:  Short of breath EXAM: CHEST  2 VIEW COMPARISON:  11/23/2014 FINDINGS: There is hazy airspace disease in the right mid and lower lung zones. There is opacity at the left costophrenic angle. Cardiomegaly. Left upper lung zone is clear. No pneumothorax. IMPRESSION: Bilateral airspace disease suggesting bilateral bronchopneumonia or asymmetric edema. Electronically Signed   By: Marybelle Killings M.D.   On: 08/28/2015 13:52   Ct Head Wo Contrast  08/28/2015  CLINICAL DATA:  Headache.  Hypertension. EXAM: CT HEAD WITHOUT CONTRAST TECHNIQUE: Contiguous axial images were obtained from the base of the skull through the vertex without intravenous contrast. COMPARISON:  03/26/2013 FINDINGS: The brainstem, cerebellum, cerebral peduncles, thalami, basal ganglia, basilar cisterns, and ventricular system appear within normal limits. Periventricular white matter and corona radiata hypodensities favor chronic  ischemic microvascular white matter disease. No intracranial hemorrhage, mass lesion, or acute CVA. Chronic left maxillary sinusitis noted with mild chronic ethmoid sinusitis. There is atherosclerotic calcification of the cavernous carotid arteries bilaterally. IMPRESSION: 1. No acute intracranial findings. 2. Periventricular white matter and corona radiata hypodensities favor chronic ischemic microvascular white matter disease. 3. Chronic left maxillary sinusitis and mild chronic ethmoid sinusitis. Electronically Signed   By: Van Clines M.D.   On: 08/28/2015 15:38   Ct Chest Wo Contrast  08/29/2015  CLINICAL DATA:  Hemoptysis. Shortness of breath and cough beginning yesterday. EXAM: CT CHEST WITHOUT CONTRAST TECHNIQUE: Multidetector CT imaging of the chest was performed following the standard protocol without IV contrast. COMPARISON:  08/28/2015 chest radiographs. FINDINGS: There is a a borderline enlarged lower right paratracheal lymph node which measures 11 mm in short axis. No enlarged hilar lymph nodes are identified on this unenhanced study. Three vessel coronary artery and prominent thoracic aortic calcification are noted. The heart is mildly enlarged. There are small bilateral pleural effusions. There are patchy consolidative and ground-glass opacities in the right upper lobe, right middle lobe, and both lower lobes, much of which is peribronchovascular in distribution. The central airways are patent. A 2.5 cm calcified stone is noted in the gallbladder. Thoracic spondylosis is noted. IMPRESSION: 1. Patchy right upper, right middle, and bilateral lower lobe opacities compatible with multifocal pneumonia. 2. Small bilateral pleural effusions. 3. Cholelithiasis. Electronically Signed   By: Logan Bores M.D.   On: 08/29/2015 13:55    CBC  Recent Labs Lab 08/28/15 1207 08/30/15 0315 08/31/15 0520 09/01/15 0650  WBC 18.7* 15.4* 7.2 5.1  HGB 10.0* 8.0* 8.1* 8.7*  HCT 32.5* 25.1* 25.9* 28.3*   PLT 80* 62* 65* 75*  MCV 86.7 86.6 86.9 86.5  MCH 26.7 27.6 27.2 26.6  MCHC 30.8 31.9 31.3 30.7  RDW 15.2 16.1* 16.0* 15.6*    Chemistries   Recent Labs Lab 08/28/15 1207 08/28/15 1846 08/29/15 0359 08/30/15 0315 08/31/15 0520 09/01/15 0650  NA 139  --  137 136 137 139  K 4.5  --  4.0 4.2 4.4 4.5  CL 107  --  106 104 104 105  CO2 23  --  24 24 25 25   GLUCOSE 210*  --  167* 166* 118* 112*  BUN 22*  --  24* 32* 40* 41*  CREATININE 1.90*  --  2.41* 3.10* 3.22* 3.25*  CALCIUM 8.7*  --  8.5* 8.1* 8.3* 8.5*  MG  --  1.3*  --   --   --   --    ------------------------------------------------------------------------------------------------------------------ estimated creatinine clearance is 23.5 mL/min (by C-G formula based on Cr of 3.25). ------------------------------------------------------------------------------------------------------------------ No results for input(s): HGBA1C in the last 72 hours. ------------------------------------------------------------------------------------------------------------------ No results for input(s): CHOL, HDL, LDLCALC, TRIG, CHOLHDL, LDLDIRECT in the last 72 hours. ------------------------------------------------------------------------------------------------------------------  Recent Labs  08/29/15 1135  TSH 0.931   ------------------------------------------------------------------------------------------------------------------ No results for input(s): VITAMINB12, FOLATE, FERRITIN, TIBC, IRON, RETICCTPCT in the last 72 hours.  Coagulation profile No results for input(s): INR, PROTIME in the last 168 hours.  No results for input(s): DDIMER in the last 72 hours.  Cardiac Enzymes No results for input(s): CKMB, TROPONINI, MYOGLOBIN in the last 168 hours.  Invalid input(s): CK ------------------------------------------------------------------------------------------------------------------ Invalid input(s): [place]  08/30/15 2156 08/31/15 0552 08/31/15 1126 08/31/15 1631 08/31/15 2131 09/01/15 0621  GLUCAP 135* 109* 139* 160* 113* 103*     Joshwa Hemric M.D. [facility] 09/01/2015, 11:04 AM  Pager: [phone] Between 7am to 7pm - call Pager - [phone]  After 7pm go to [email or web address] - password [email or web address]  Call night coverage person covering after 7pm

## 2015-09-01 NOTE — Progress Notes (Signed)
Attempted to do therapy with this pt.  Pt very upset about his living situation and mad that nobody is finding him a place to live.  He was encouraged to get up and move around the room as to increase his independence to assist him in getting back home at some point and pt continues to state it will do no good for him to get up and I need not stay and "badger" him into doing anything.  Explained all benefits of therapy and pt continued to refuse.   Jinger Neighbors, [place] [phone]

## 2015-09-01 NOTE — Care Management Important Message (Signed)
Important Message  Patient Details  Name: [name] MRN: [id number] Date of Birth: September 10, 1933   Medicare Important Message Given:  Yes    [name] 09/01/2015, 2:24 PM

## 2015-09-01 NOTE — Progress Notes (Signed)
MD notified of BP being in 200's due to patient's refusal to take meds at this time. Agricultural consultant notified. Will continue to monitor.

## 2015-09-01 NOTE — Clinical Social Work Note (Signed)
Clinical Social Worker continuing to follow patient and family for support and discharge planning needs.  Patient has accepted a bed at [facility] and per MD note, likely medically ready for discharge on Saturday.  Facility admissions coordinator completed paperwork with patient at bedside for weekend admission.  Facility agreeable with weekend admission if patient discharged.  CSW remains available for support and to facilitate patient discharge needs.  Barbette Or, [place]

## 2015-09-01 NOTE — Discharge Instructions (Signed)
Information on my medicine - ELIQUIS® (apixaban) ° °This medication education was reviewed with me or my healthcare representative as part of my discharge preparation.  The pharmacist that spoke with me during my hospital stay was:  Chasin Findling Kay, RPH ° °Why was Eliquis® prescribed for you? °Eliquis® was prescribed for you to reduce the risk of forming blood clots that can cause a stroke if you have a medical condition called atrial fibrillation (a type of irregular heartbeat) OR to reduce the risk of a blood clots forming after orthopedic surgery. ° °What do You need to know about Eliquis® ? °Take your Eliquis® TWICE DAILY - one tablet in the morning and one tablet in the evening with or without food.  It would be best to take the doses about the same time each day. ° °If you have difficulty swallowing the tablet whole please discuss with your pharmacist how to take the medication safely. ° °Take Eliquis® exactly as prescribed by your doctor and DO NOT stop taking Eliquis® without talking to the doctor who prescribed the medication.  Stopping may increase your risk of developing a new clot or stroke.  Refill your prescription before you run out. ° °After discharge, you should have regular check-up appointments with your healthcare provider that is prescribing your Eliquis®.  In the future your dose may need to be changed if your kidney function or weight changes by a significant amount or as you get older. ° °What do you do if you miss a dose? °If you miss a dose, take it as soon as you remember on the same day and resume taking twice daily.  Do not take more than one dose of ELIQUIS at the same time. ° °Important Safety Information °A possible side effect of Eliquis® is bleeding. You should call your healthcare provider right away if you experience any of the following: °? Bleeding from an injury or your nose that does not stop. °? Unusual colored urine (red or dark brown) or unusual colored stools (red or  black). °? Unusual bruising for unknown reasons. °? A serious fall or if you hit your head (even if there is no bleeding). ° °Some medicines may interact with Eliquis® and might increase your risk of bleeding or clotting while on Eliquis®. To help avoid this, consult your healthcare provider or pharmacist prior to using any new prescription or non-prescription medications, including herbals, vitamins, non-steroidal anti-inflammatory drugs (NSAIDs) and supplements. ° °This website has more information on Eliquis® (apixaban): [email or web address].com. ° ° °

## 2015-09-02 DIAGNOSIS — I4892 Unspecified atrial flutter: Secondary | ICD-10-CM

## 2015-09-02 DIAGNOSIS — L03116 Cellulitis of left lower limb: Secondary | ICD-10-CM

## 2015-09-02 DIAGNOSIS — E118 Type 2 diabetes mellitus with unspecified complications: Secondary | ICD-10-CM

## 2015-09-02 DIAGNOSIS — Z9114 Patient's other noncompliance with medication regimen: Secondary | ICD-10-CM

## 2015-09-02 LAB — BASIC METABOLIC PANEL
ANION GAP: 8 (ref 5–15)
BUN: 39 mg/dL — ABNORMAL HIGH (ref 6–20)
CALCIUM: 8.6 mg/dL — AB (ref 8.9–10.3)
CO2: 24 mmol/L (ref 22–32)
CREATININE: 2.92 mg/dL — AB (ref 0.61–1.24)
Chloride: 107 mmol/L (ref 101–111)
GFR, EST AFRICAN AMERICAN: 22 mL/min — AB (ref >60)
GFR, EST NON AFRICAN AMERICAN: 19 mL/min — AB (ref >60)
GLUCOSE: 149 mg/dL — AB (ref 65–99)
Potassium: 4.4 mmol/L (ref 3.5–5.1)
Sodium: 139 mmol/L (ref 135–145)

## 2015-09-02 LAB — GLUCOSE, CAPILLARY
Glucose-Capillary: 116 mg/dL — ABNORMAL HIGH (ref 65–99)
Glucose-Capillary: 154 mg/dL — ABNORMAL HIGH (ref 65–99)

## 2015-09-02 LAB — CBC
HEMATOCRIT: 28.3 % — AB (ref 39.0–52.0)
Hemoglobin: 8.6 g/dL — ABNORMAL LOW (ref 13.0–17.0)
MCH: 26.3 pg (ref 26.0–34.0)
MCHC: 30.4 g/dL (ref 30.0–36.0)
MCV: 86.5 fL (ref 78.0–100.0)
PLATELETS: 90 10*3/uL — AB (ref 150–400)
RBC: 3.27 MIL/uL — ABNORMAL LOW (ref 4.22–5.81)
RDW: 15.5 % (ref 11.5–15.5)
WBC: 5.3 10*3/uL (ref 4.0–10.5)

## 2015-09-02 MED ORDER — OXYCODONE-ACETAMINOPHEN 5-325 MG PO TABS: 1.0000 | ORAL_TABLET | Freq: Four times a day (QID) | ORAL | Status: DC | PRN

## 2015-09-02 MED ORDER — BISACODYL 10 MG RE SUPP: 10.0000 mg | RECTAL | Status: DC | PRN

## 2015-09-02 MED ORDER — INSULIN ASPART 100 UNIT/ML ~~LOC~~ SOLN: 0.0000 [IU] | Freq: Three times a day (TID) | SUBCUTANEOUS | Status: DC

## 2015-09-02 MED ORDER — ALBUTEROL SULFATE (2.5 MG/3ML) 0.083% IN NEBU: 2.5000 mg | INHALATION_SOLUTION | RESPIRATORY_TRACT | Status: DC | PRN

## 2015-09-02 MED ORDER — HYDRALAZINE HCL 20 MG/ML IJ SOLN
INTRAMUSCULAR | Status: AC
  Filled 2015-09-02: qty 1

## 2015-09-02 MED ORDER — FUROSEMIDE 40 MG PO TABS: 40.0000 mg | ORAL_TABLET | Freq: Every day | ORAL | Status: DC

## 2015-09-02 MED ORDER — ISOSORB DINITRATE-HYDRALAZINE 20-37.5 MG PO TABS: 1.0000 | ORAL_TABLET | Freq: Three times a day (TID) | ORAL | Status: DC

## 2015-09-02 MED ORDER — APIXABAN 2.5 MG PO TABS
2.5000 mg | ORAL_TABLET | Freq: Two times a day (BID) | ORAL | Status: DC
Start: 2015-09-02 — End: 2015-12-10

## 2015-09-02 MED ORDER — POLYETHYLENE GLYCOL 3350 17 G PO PACK: 17.0000 g | PACK | Freq: Every day | ORAL | Status: DC | PRN

## 2015-09-02 MED ORDER — INSULIN GLARGINE 100 UNIT/ML ~~LOC~~ SOLN: 5.0000 [IU] | Freq: Every day | SUBCUTANEOUS | Status: DC

## 2015-09-02 MED ORDER — HYDRALAZINE HCL 20 MG/ML IJ SOLN: 10.0000 mg | Freq: Four times a day (QID) | INTRAMUSCULAR | Status: DC | PRN

## 2015-09-02 MED ORDER — AMOXICILLIN-POT CLAVULANATE 500-125 MG PO TABS: 500.0000 mg | ORAL_TABLET | Freq: Two times a day (BID) | ORAL | Status: DC

## 2015-09-02 NOTE — Discharge Summary (Signed)
Physician Discharge Summary   Patient ID: [name] MRN: [id number] DOB/AGE: 12/01/1933 82 y.o.  Admit date: 08/28/2015 Discharge date: 09/02/2015  Primary Care Physician:  Philis Fendt, MD  Discharge Diagnoses:   Present on Admission:  .  Acute respiratory failure ([place]) . Acute on chronic diastolic heart failure ([place]) . Anemia of chronic renal failure, stage 3 (moderate) . Accelerated hypertension . Cellulitis of left lower extremity . Atrial fibrillation ([place]) . Thrombocytopenia ([place]) . acute kidney injury on security stage 3/4 Atrial flutter   Consults:  None  Recommendations for Outpatient Follow-up:  1. PT evaluation done and recommended skilled nursing facility  2. Please repeat CBC/BMET at next visit 3. Patient will need Unna boots, or lower extremity leg wrapping for wound healing    DIET:Soft diet, heart healthy diet    Allergies:  No Known Allergies   DISCHARGE MEDICATIONS: Current Discharge Medication List    START taking these medications   Details  albuterol (PROVENTIL) (2.5 MG/3ML) 0.083% nebulizer solution Take 3 mLs (2.5 mg total) by nebulization every 4 (four) hours as needed for wheezing or shortness of breath. Qty: 75 mL, Refills: 12    amoxicillin-clavulanate (AUGMENTIN) 500-125 MG tablet Take 1 tablet (500 mg total) by mouth 2 (two) times daily with a meal. X 5days    apixaban (ELIQUIS) 2.5 MG TABS tablet Take 1 tablet (2.5 mg total) by mouth 2 (two) times daily. Qty: 60 tablet    oxyCODONE-acetaminophen (PERCOCET/ROXICET) 5-325 MG tablet Take 1 tablet by mouth every 6 (six) hours as needed for moderate pain or severe pain. Qty: 30 tablet, Refills: 0      CONTINUE these medications which have CHANGED   Details  bisacodyl (DULCOLAX) 10 MG suppository Place 1 suppository (10 mg total) rectally as needed for moderate constipation. Qty: 12 suppository, Refills: 0    furosemide (LASIX) 40 MG tablet Take 1 tablet (40 mg total) by mouth  daily. Qty: 30 tablet, Refills: 0    insulin aspart (NOVOLOG) 100 UNIT/ML injection Inject 0-15 Units into the skin 3 (three) times daily with meals. Sliding scale  CBG 70 - 120: 0 units: CBG 121 - 150: 2 units; CBG 151 - 200: 3 units; CBG 201 - 250: 5 units; CBG 251 - 300: 8 units;CBG 301 - 350: 11 units; CBG 351 - 400: 15 units; CBG > 400 : 15 units and notify MD Qty: 10 mL, Refills: 11    insulin glargine (LANTUS) 100 UNIT/ML injection Inject 0.05 mLs (5 Units total) into the skin at bedtime. Qty: 10 mL, Refills: 11    isosorbide-hydrALAZINE (BIDIL) 20-37.5 MG tablet Take 1 tablet by mouth 3 (three) times daily. Qty: 90 tablet, Refills: 0    polyethylene glycol (MIRALAX) packet Take 17 g by mouth daily as needed. Qty: 14 each, Refills: 0      CONTINUE these medications which have NOT CHANGED   Details  ferrous sulfate 325 (65 FE) MG tablet Take 1 tablet (325 mg total) by mouth 2 (two) times daily with a meal. Qty: 60 tablet, Refills: 0      STOP taking these medications     aspirin 81 MG tablet      carvedilol (COREG) 12.5 MG tablet      glimepiride (AMARYL) 2 MG tablet      levalbuterol (XOPENEX) 0.63 MG/3ML nebulizer solution          Brief H and P: For complete details please refer to admission H and P, but in brief [name]  [name] is a 82 y.o. male with a history of insulin-dependent diabetes mellitus, chronic diastolic heart failure, hypertension, severe venous stasis/ lymphedema, colon cancer in 2015 s/p surgery who presents to the hospital for worsening of his pedal edema. Also found to have a very congested cough. Chest x-ray suggestive of pneumonia versus heart failure. Started on IV diuretics and admitted to the hospital.  He is known to be chronically non-compliant with medications at home. Discharged home on 12/10 after being treated for lower extremity cellulitis.   Hospital Course:   Bilateral pneumonia - CT chest showed bilateral pneumonia, patient  was started on IV vancomycin and Zosyn for hcap, transitioned to oral Augmentin on 1/5. Continue for another 5 days to complete full course. - HIV negative, urine strep antigen negative urine legionella antigen negative, , respiratory panel pending - WBC count 5.3 at the time of discharge. - Blood cultures negative so far   Active Problems: A-flutter- new issue?, No prior history of atrial flutter - rate currently controlled, placed on apixaban on renal dosing - PT recommending skilled nursing facility - Beta blocker discontinued due to bradycardia - 2-D echo showed EF of 65-70%.    MSSA Cellulitis of left lower extremity?? Severe lymphedema  - Keflex on med rec- patient is a poor historian thereofore not sure if he was taking it - Wound culture showed MSSA, currently placed on Augmentin - Continue wound care, unna boots or outpatient wound care referral   Acute vs chronic dCHF - 2-D echo showed EF of 65-70%, creatinine now finally trending down. 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 - Continue to hold diuretics, vancomycin discontinued, avoid nephrotoxins - Creatinine now improving, restart Lasix if close to baseline on 09/04/15   Anemia of chronic renal failure, stage 3 (moderate) - follow- cont oral Iron   Uncontrolled diabetes mellitus with diabetic nephropathy, with long-term current use of insulin  -cont Lantus and sliding scale insulin    Accelerated hypertension - cont Bidil  - BP has been somewhat uncontrolled as patient has been refusing his medications   Thrombocytopenia - chronic  Colon cancer - need surveillance colonoscopy per GI   Day of Discharge BP 184/65 mmHg  Pulse 49  Temp(Src) 98.6 F (37 C) (Oral)  Resp 18  Ht 5' 10.5" (1.791 m)  Wt 113.3 kg (249 lb 12.5 oz)  BMI 35.32 kg/m2  SpO2 99%  Physical Exam: General: Alert and awake oriented x3 not in any acute distress. HEENT: anicteric sclera, pupils  reactive to light and accommodation CVS: S1-S2 clear no murmur rubs or gallops Chest: clear to auscultation bilaterally, no wheezing rales or rhonchi Abdomen: soft nontender, nondistended, normal bowel sounds Extremities: no cyanosis, clubbing, b/l LE wrapped  Neuro: Cranial nerves II-XII intact, no focal neurological deficits   The results of significant diagnostics from this hospitalization (including imaging, microbiology, ancillary and laboratory) are listed below for reference.    LAB RESULTS: Basic Metabolic Panel:  Recent Labs Lab 08/28/15 1846  09/01/15 0650 09/02/15 0329  NA  --   < > 139 139  K  --   < > 4.5 4.4  CL  --   < > 105 107  CO2  --   < > 25 24  GLUCOSE  --   < > 112* 149*  BUN  --   < > 41* 39*  CREATININE  --   < > 3.25* 2.92*  CALCIUM  --   < >  8.5* 8.6*  MG 1.3*  --   --   --   PHOS 2.5  --   --   --   < > = values in this interval not displayed. Liver Function Tests: No results for input(s): AST, ALT, ALKPHOS, BILITOT, PROT, ALBUMIN in the last 168 hours. No results for input(s): LIPASE, AMYLASE in the last 168 hours. No results for input(s): AMMONIA in the last 168 hours. CBC:  Recent Labs Lab 09/01/15 0650 09/02/15 0329  WBC 5.1 5.3  HGB 8.7* 8.6*  HCT 28.3* 28.3*  MCV 86.5 86.5  PLT 75* 90*   Cardiac Enzymes: No results for input(s): CKTOTAL, CKMB, CKMBINDEX, TROPONINI in the last 168 hours. BNP: Invalid input(s): POCBNP CBG:  Recent Labs Lab 09/02/15 0605 09/02/15 1117  GLUCAP 116* 154*    Significant Diagnostic Studies:  Dg Chest 2 View  08/28/2015  CLINICAL DATA:  Short of breath EXAM: CHEST  2 VIEW COMPARISON:  11/23/2014 FINDINGS: There is hazy airspace disease in the right mid and lower lung zones. There is opacity at the left costophrenic angle. Cardiomegaly. Left upper lung zone is clear. No pneumothorax. IMPRESSION: Bilateral airspace disease suggesting bilateral bronchopneumonia or asymmetric edema. Electronically Signed    By: Marybelle Killings M.D.   On: 08/28/2015 13:52   Ct Head Wo Contrast  08/28/2015  CLINICAL DATA:  Headache.  Hypertension. EXAM: CT HEAD WITHOUT CONTRAST TECHNIQUE: Contiguous axial images were obtained from the base of the skull through the vertex without intravenous contrast. COMPARISON:  03/26/2013 FINDINGS: The brainstem, cerebellum, cerebral peduncles, thalami, basal ganglia, basilar cisterns, and ventricular system appear within normal limits. Periventricular white matter and corona radiata hypodensities favor chronic ischemic microvascular white matter disease. No intracranial hemorrhage, mass lesion, or acute CVA. Chronic left maxillary sinusitis noted with mild chronic ethmoid sinusitis. There is atherosclerotic calcification of the cavernous carotid arteries bilaterally. IMPRESSION: 1. No acute intracranial findings. 2. Periventricular white matter and corona radiata hypodensities favor chronic ischemic microvascular white matter disease. 3. Chronic left maxillary sinusitis and mild chronic ethmoid sinusitis. Electronically Signed   By: Van Clines M.D.   On: 08/28/2015 15:38   Ct Chest Wo Contrast  08/29/2015  CLINICAL DATA:  Hemoptysis. Shortness of breath and cough beginning yesterday. EXAM: CT CHEST WITHOUT CONTRAST TECHNIQUE: Multidetector CT imaging of the chest was performed following the standard protocol without IV contrast. COMPARISON:  08/28/2015 chest radiographs. FINDINGS: There is a a borderline enlarged lower right paratracheal lymph node which measures 11 mm in short axis. No enlarged hilar lymph nodes are identified on this unenhanced study. Three vessel coronary artery and prominent thoracic aortic calcification are noted. The heart is mildly enlarged. There are small bilateral pleural effusions. There are patchy consolidative and ground-glass opacities in the right upper lobe, right middle lobe, and both lower lobes, much of which is peribronchovascular in distribution. The  central airways are patent. A 2.5 cm calcified stone is noted in the gallbladder. Thoracic spondylosis is noted. IMPRESSION: 1. Patchy right upper, right middle, and bilateral lower lobe opacities compatible with multifocal pneumonia. 2. Small bilateral pleural effusions. 3. Cholelithiasis. Electronically Signed   By: Logan Bores M.D.   On: 08/29/2015 13:55    2D ECHO: Study Conclusions  - Left ventricle: The cavity size was normal. There was moderate concentric hypertrophy. Systolic function was vigorous. The estimated ejection fraction was in the range of 65% to 70%. Wall motion was normal; there were no regional wall motion abnormalities. The study was  not technically sufficient to allow evaluation of LV diastolic dysfunction due to atrial fibrillation. - Aortic valve: Trileaflet; mildly thickened, mildly calcified leaflets. There was no regurgitation. - Aortic root: The aortic root was normal in size. - Mitral valve: Structurally normal valve. There was no regurgitation. - Left atrium: The atrium was moderately dilated. - Right ventricle: Systolic function was normal. - Right atrium: The atrium was normal in size. - Tricuspid valve: There was mild regurgitation. - Pulmonic valve: There was no regurgitation. - Pulmonary arteries: Systolic pressure was mildly increased. PA peak pressure: 34 mm Hg (S). - Inferior vena cava: The vessel was normal in size. - Pericardium, extracardiac: There was no pericardial effusion.  Disposition and Follow-up: Discharge Instructions    (HEART FAILURE PATIENTS) Call MD:  Anytime you have any of the following symptoms: 1) 3 pound weight gain in 24 hours or 5 pounds in 1 week 2) shortness of breath, with or without a dry hacking cough 3) swelling in the hands, feet or stomach 4) if you have to sleep on extra pillows at night in order to breathe.    Complete by:  As directed      Diet Carb Modified    Complete by:  As directed       Increase activity slowly    Complete by:  As directed             DISPOSITION: SNF   DISCHARGE FOLLOW-UP Follow-up Information    Follow up with AVBUERE,EDWIN A, MD. Schedule an appointment as soon as possible for a visit in 10 days.   Specialty:  Internal Medicine   Why:  for hospital follow-up, obtain labs BMET   Contact information:   [place] [address] [phone]       Please follow up.   Why:  [facility] Will follow      Follow up with [facility] .   Specialty:  [place] information:   [id number] [place] [id number]       Time spent on Discharge: 46mins   Signed:   RAI,RIPUDEEP M.D. [facility] 09/02/2015, 12:00 PM Pager: [phone]

## 2015-09-02 NOTE — Clinical Social Work Note (Addendum)
Patient to be d/c'ed today to [facility].  Patient and family agreeable to plans will transport via ems RN to call report to [phone], [facility] scheduled for 2pm transportation.  Evette Cristal, MSW, [place]

## 2015-09-07 ENCOUNTER — Encounter: Payer: Self-pay | Admitting: Internal Medicine

## 2015-09-07 ENCOUNTER — Non-Acute Institutional Stay (SKILLED_NURSING_FACILITY): Payer: Medicare Other | Admitting: Internal Medicine

## 2015-09-07 DIAGNOSIS — I482 Chronic atrial fibrillation, unspecified: Secondary | ICD-10-CM

## 2015-09-07 DIAGNOSIS — Z794 Long term (current) use of insulin: Secondary | ICD-10-CM | POA: Diagnosis not present

## 2015-09-07 DIAGNOSIS — N184 Chronic kidney disease, stage 4 (severe): Secondary | ICD-10-CM

## 2015-09-07 DIAGNOSIS — Z9114 Patient's other noncompliance with medication regimen: Secondary | ICD-10-CM

## 2015-09-07 DIAGNOSIS — I5032 Chronic diastolic (congestive) heart failure: Secondary | ICD-10-CM | POA: Diagnosis not present

## 2015-09-07 DIAGNOSIS — I890 Lymphedema, not elsewhere classified: Secondary | ICD-10-CM

## 2015-09-07 DIAGNOSIS — L03116 Cellulitis of left lower limb: Secondary | ICD-10-CM

## 2015-09-07 DIAGNOSIS — D649 Anemia, unspecified: Secondary | ICD-10-CM | POA: Diagnosis not present

## 2015-09-07 DIAGNOSIS — E1122 Type 2 diabetes mellitus with diabetic chronic kidney disease: Secondary | ICD-10-CM | POA: Diagnosis not present

## 2015-09-07 NOTE — Progress Notes (Signed)
Patient ID: [name], male   DOB: 05/28/1934, 82 y.o.   MRN: [id number]    HISTORY AND PHYSICAL   DATE: 09/07/15  Location:  [facility]    Place of Service: SNF (31)   Extended Emergency Contact Information Primary Emergency Contact: Tomasik,Tami Address: [address]          [place], [place] [address] Johnnette Litter of [place] Phone: [phone] Mobile Phone: [phone] Relation: Daughter  Advanced Directive information  FULL CODE  Chief Complaint  Patient presents with  . New Admit To SNF    HPI:  82 yo male seen today as a new admission into SNF following hospital stay for acute/chronic heart failure, acute/chronic respiratory failure, LLE cellulitis, accelerated HTN, A/CKD stage 4, afib, anemia of chronic disease. He will need 5 additional days of Augmentin (stop date 1/12th). He presents to SNF for short term rehab  He denies any hx HF or lung issues. He has no concerns. No falls. No nursing issues. He will complete abx today. He takes prn percocet for leg pain  HTN/ chronic HF- stable on bidil and lasix. Currently not on fluid restriction  Aflutter/afib - rate controlled without med. He takes eliquis  CKD - stage 4. Cr 2.92 at d/c  Chronic respiratory failure - has albuterol nebs prn  Anemia - takes  iron supplements. Hgb 8.6 at d/c  DM - uncontrolled. CBGs elevated with reading of 228 today. He is on SSI and lantus. A1c 6.8%    Past Medical History  Diagnosis Date  . CHF (congestive heart failure) ([place])   . Diabetes mellitus without complication ([place])   . Hypertension   . Coronary artery disease   . Atrial fibrillation ([place])   . Chronic kidney disease     Past Surgical History  Procedure Laterality Date  . Leg surgery      Left leg surgery. broken leg  . Esophagogastroduodenoscopy  06/19/2012    Procedure: ESOPHAGOGASTRODUODENOSCOPY (EGD);  Surgeon: Beryle Beams, MD;  Location: [facility] ENDOSCOPY;  Service: Endoscopy;   Laterality: N/A;  . Colonoscopy  06/21/2012    Procedure: COLONOSCOPY;  Surgeon: Juanita Craver, MD;  Location: [facility] ENDOSCOPY;  Service: Endoscopy;  Laterality: N/A;  . Colonoscopy Left 02/13/2014    Procedure: COLONOSCOPY;  Surgeon: Juanita Craver, MD;  Location: [place];  Service: Endoscopy;  Laterality: Left;  . Laparoscopic right colectomy Right 02/15/2014    Procedure: LAPAROSCOPIC ASISSTED RIGHT  COLECTOMY, POSSIBLE OPEN;  Surgeon: Rolm Bookbinder, MD;  Location: [place];  Service: General;  Laterality: Right;    Patient Care Team: Nolene Ebbs, MD as PCP - General (Internal Medicine)  Social History   Social History  . Marital Status: Widowed    Spouse Name: N/A  . Number of Children: N/A  . Years of Education: N/A   Occupational History  . Not on file.   Social History Main Topics  . Smoking status: Former Smoker    Quit date: 08/26/1998  . Smokeless tobacco: Never Used  . Alcohol Use: No     Comment: in the past  . Drug Use: No  . Sexual Activity: No   Other Topics Concern  . Not on file   Social History Narrative   Widower-   Has #2 children and #2 grandchildren and a great grandchild   Retired from [employer] (15 years ago)   Currently resides at [facility] and [place] with walker-     reports that he  quit smoking about 17 years ago. He has never used smokeless tobacco. He reports that he does not drink alcohol or use illicit drugs.  Family History  Problem Relation Age of Onset  . Brain cancer Mother   . Diabetes type II Sister   . Narcolepsy Paternal Uncle    Family Status  Relation Status Death Age  . Mother Deceased   . Father Deceased   . Sister Alive   . Paternal Uncle Deceased     Immunization History  Administered Date(s) Administered  . Influenza Split 06/25/2011  . Influenza, High Dose Seasonal PF 06/07/2013  . Pneumococcal Polysaccharide-23 02/25/2012, 06/20/2012  . Td 01/28/2005  . Tdap 10/20/2014    No Known  Allergies  Medications: Patient's Medications  New Prescriptions   No medications on file  Previous Medications   ALBUTEROL (PROVENTIL) (2.5 MG/3ML) 0.083% NEBULIZER SOLUTION    Take 3 mLs (2.5 mg total) by nebulization every 4 (four) hours as needed for wheezing or shortness of breath.   AMOXICILLIN-CLAVULANATE (AUGMENTIN) 500-125 MG TABLET    Take 1 tablet (500 mg total) by mouth 2 (two) times daily with a meal. X 5days   APIXABAN (ELIQUIS) 2.5 MG TABS TABLET    Take 1 tablet (2.5 mg total) by mouth 2 (two) times daily.   BISACODYL (DULCOLAX) 10 MG SUPPOSITORY    Place 1 suppository (10 mg total) rectally as needed for moderate constipation.   FERROUS SULFATE 325 (65 FE) MG TABLET    Take 1 tablet (325 mg total) by mouth 2 (two) times daily with a meal.   FUROSEMIDE (LASIX) 40 MG TABLET    Take 1 tablet (40 mg total) by mouth daily.   INSULIN ASPART (NOVOLOG) 100 UNIT/ML INJECTION    Inject 0-15 Units into the skin 3 (three) times daily with meals. Sliding scale  CBG 70 - 120: 0 units: CBG 121 - 150: 2 units; CBG 151 - 200: 3 units; CBG 201 - 250: 5 units; CBG 251 - 300: 8 units;CBG 301 - 350: 11 units; CBG 351 - 400: 15 units; CBG > 400 : 15 units and notify MD   INSULIN GLARGINE (LANTUS) 100 UNIT/ML INJECTION    Inject 0.05 mLs (5 Units total) into the skin at bedtime.   ISOSORBIDE-HYDRALAZINE (BIDIL) 20-37.5 MG TABLET    Take 1 tablet by mouth 3 (three) times daily.   OXYCODONE-ACETAMINOPHEN (PERCOCET/ROXICET) 5-325 MG TABLET    Take 1 tablet by mouth every 6 (six) hours as needed for moderate pain or severe pain.   POLYETHYLENE GLYCOL (MIRALAX) PACKET    Take 17 g by mouth daily as needed.  Modified Medications   No medications on file  Discontinued Medications   No medications on file    Review of Systems  Constitutional: Negative for chills, activity change and fatigue.  HENT: Negative for sore throat and trouble swallowing.   Eyes: Negative for visual disturbance.  Respiratory:  Negative for cough, chest tightness and shortness of breath.   Cardiovascular: Positive for leg swelling. Negative for chest pain and palpitations.  Gastrointestinal: Negative for nausea, vomiting, abdominal pain and blood in stool.  Genitourinary: Negative for urgency, frequency and difficulty urinating.  Musculoskeletal: Negative for arthralgias and gait problem.  Skin: Negative for rash.  Neurological: Negative for weakness and headaches.  Psychiatric/Behavioral: Negative for confusion and sleep disturbance. The patient is not nervous/anxious.     Filed Vitals:   09/07/15 1721  BP: 143/75  Pulse: 81  Temp: 98.3 F (  36.8 C)  SpO2: 97%   There is no weight on file to calculate BMI.  Physical Exam  Constitutional: He is oriented to person, place, and time. He appears well-developed and well-nourished.  HENT:  Mouth/Throat: Oropharynx is clear and moist.  MMM  Eyes: Pupils are equal, round, and reactive to light. No scleral icterus.  Neck: Neck supple. Carotid bruit is not present. No thyromegaly present.  Cardiovascular: Normal rate, regular rhythm and intact distal pulses.  Frequent extrasystoles are present. Exam reveals no gallop and no friction rub.   Murmur heard.  Systolic murmur is present with a grade of 1/6  +1 pitting LE edema b/l. No calf TTP  Pulmonary/Chest: Effort normal and breath sounds normal. He has no wheezes. He has no rales. He exhibits no tenderness.  Abdominal: Soft. Bowel sounds are normal. He exhibits no distension, no abdominal bruit, no pulsatile midline mass and no mass. There is no tenderness. There is no rebound and no guarding.  Musculoskeletal: He exhibits edema.  L>R hand edema  Lymphadenopathy:    He has no cervical adenopathy.  Neurological: He is alert and oriented to person, place, and time.  Skin: Skin is warm and dry. No rash noted.  dsg intact b/l leg  Psychiatric: He has a normal mood and affect. His behavior is normal. Thought content  normal.     Labs reviewed: Admission on 08/28/2015, Discharged on 09/02/2015  Component Date Value Ref Range Status  . Sodium 08/28/2015 139  135 - 145 mmol/L Final  . Potassium 08/28/2015 4.5  3.5 - 5.1 mmol/L Final  . Chloride 08/28/2015 107  101 - 111 mmol/L Final  . CO2 08/28/2015 23  22 - 32 mmol/L Final  . Glucose, Bld 08/28/2015 210* 65 - 99 mg/dL Final  . BUN 08/28/2015 22* 6 - 20 mg/dL Final  . Creatinine, Ser 08/28/2015 1.90* 0.61 - 1.24 mg/dL Final  . Calcium 08/28/2015 8.7* 8.9 - 10.3 mg/dL Final  . GFR calc non Af Amer 08/28/2015 32* >60 mL/min Final  . GFR calc Af Amer 08/28/2015 37* >60 mL/min Final   Comment: (NOTE) The eGFR has been calculated using the CKD EPI equation. This calculation has not been validated in all clinical situations. eGFR's persistently <60 mL/min signify possible Chronic Kidney Disease.   . Anion gap 08/28/2015 9  5 - 15 Final  . WBC 08/28/2015 18.7* 4.0 - 10.5 K/uL Final   REPEATED TO VERIFY  . RBC 08/28/2015 3.75* 4.22 - 5.81 MIL/uL Final  . Hemoglobin 08/28/2015 10.0* 13.0 - 17.0 g/dL Final   REPEATED TO VERIFY  . HCT 08/28/2015 32.5* 39.0 - 52.0 % Final  . MCV 08/28/2015 86.7  78.0 - 100.0 fL Final  . MCH 08/28/2015 26.7  26.0 - 34.0 pg Final  . MCHC 08/28/2015 30.8  30.0 - 36.0 g/dL Final  . RDW 08/28/2015 15.2  11.5 - 15.5 % Final  . Platelets 08/28/2015 80* 150 - 400 K/uL Final   Comment: REPEATED TO VERIFY PLATELET COUNT CONFIRMED BY SMEAR   . Troponin i, poc 08/28/2015 0.02  0.00 - 0.08 ng/mL Final  . Comment 3 08/28/2015          Final   Comment: Due to the release kinetics of cTnI, a negative result within the first hours of the onset of symptoms does not rule out myocardial infarction with certainty. If myocardial infarction is still suspected, repeat the test at appropriate intervals.   . B Natriuretic Peptide 08/28/2015 779.2* 0.0 -  100.0 pg/mL Final  . Magnesium 08/28/2015 1.3* 1.7 - 2.4 mg/dL Final  . Phosphorus  08/28/2015 2.5  2.5 - 4.6 mg/dL Final  . Lactic Acid, Venous 08/28/2015 1.5  0.5 - 2.0 mmol/L Final  . Sodium 08/29/2015 137  135 - 145 mmol/L Final  . Potassium 08/29/2015 4.0  3.5 - 5.1 mmol/L Final  . Chloride 08/29/2015 106  101 - 111 mmol/L Final  . CO2 08/29/2015 24  22 - 32 mmol/L Final  . Glucose, Bld 08/29/2015 167* 65 - 99 mg/dL Final  . BUN 08/29/2015 24* 6 - 20 mg/dL Final  . Creatinine, Ser 08/29/2015 2.41* 0.61 - 1.24 mg/dL Final  . Calcium 08/29/2015 8.5* 8.9 - 10.3 mg/dL Final  . GFR calc non Af Amer 08/29/2015 24* >60 mL/min Final  . GFR calc Af Amer 08/29/2015 28* >60 mL/min Final   Comment: (NOTE) The eGFR has been calculated using the CKD EPI equation. This calculation has not been validated in all clinical situations. eGFR's persistently <60 mL/min signify possible Chronic Kidney Disease.   . Anion gap 08/29/2015 7  5 - 15 Final  . Glucose-Capillary 08/28/2015 155* 65 - 99 mg/dL Final  . Comment 1 08/28/2015 Notify RN   Final  . Comment 2 08/28/2015 Document in Chart   Final  . Glucose-Capillary 08/29/2015 157* 65 - 99 mg/dL Final  . Comment 1 08/29/2015 Notify RN   Final  . Comment 2 08/29/2015 Document in Chart   Final  . TSH 08/29/2015 0.931  0.350 - 4.500 uIU/mL Final  . Free T4 08/29/2015 1.28* 0.61 - 1.12 ng/dL Final  . Glucose-Capillary 08/29/2015 158* 65 - 99 mg/dL Final  . Comment 1 08/29/2015 Notify RN   Final  . Strep Pneumo Urinary Antigen 08/29/2015 NEGATIVE  NEGATIVE Final   Comment:        Infection due to S. pneumoniae cannot be absolutely ruled out since the antigen present may be below the detection limit of the test.   . Specimen Description 08/29/2015 URINE, RANDOM   Final  . Special Requests 08/29/2015 NONE   Final  . Legionella Antigen, Urine 08/29/2015    Final                   Value:Negative for Legionella pneumophila serogroup 1                                                              Legionella pneumophila serogroup 1 antigen  can be detected in urine within 2 to 3 days of infection and may persist even after treatment. This  assay does not detect other Legionella species or serogroups. Performed at [facility]   . Report Status 08/29/2015 08/31/2015 FINAL   Final  . Specimen Description 08/30/2015 EXPECTORATED SPUTUM   Final  . Special Requests 08/30/2015 Normal   Final  . Sputum evaluation 08/30/2015 THIS SPECIMEN IS ACCEPTABLE. RESPIRATORY CULTURE REPORT TO FOLLOW.   Final  . Report Status 08/30/2015 08/30/2015 FINAL   Final  . Glucose-Capillary 08/29/2015 155* 65 - 99 mg/dL Final  . Comment 1 08/29/2015 Notify RN   Final  . Sodium 08/30/2015 136  135 - 145 mmol/L Final  . Potassium 08/30/2015 4.2  3.5 - 5.1 mmol/L Final  . Chloride 08/30/2015 104  101 - 111 mmol/L Final  . CO2 08/30/2015 24  22 - 32 mmol/L Final  . Glucose, Bld 08/30/2015 166* 65 - 99 mg/dL Final  . BUN 08/30/2015 32* 6 - 20 mg/dL Final  . Creatinine, Ser 08/30/2015 3.10* 0.61 - 1.24 mg/dL Final  . Calcium 08/30/2015 8.1* 8.9 - 10.3 mg/dL Final  . GFR calc non Af Amer 08/30/2015 18* >60 mL/min Final  . GFR calc Af Amer 08/30/2015 20* >60 mL/min Final   Comment: (NOTE) The eGFR has been calculated using the CKD EPI equation. This calculation has not been validated in all clinical situations. eGFR's persistently <60 mL/min signify possible Chronic Kidney Disease.   . Anion gap 08/30/2015 8  5 - 15 Final  . WBC 08/30/2015 15.4* 4.0 - 10.5 K/uL Final  . RBC 08/30/2015 2.90* 4.22 - 5.81 MIL/uL Final  . Hemoglobin 08/30/2015 8.0* 13.0 - 17.0 g/dL Final  . HCT 08/30/2015 25.1* 39.0 - 52.0 % Final  . MCV 08/30/2015 86.6  78.0 - 100.0 fL Final  . MCH 08/30/2015 27.6  26.0 - 34.0 pg Final  . MCHC 08/30/2015 31.9  30.0 - 36.0 g/dL Final  . RDW 08/30/2015 16.1* 11.5 - 15.5 % Final  . Platelets 08/30/2015 62* 150 - 400 K/uL Final   Comment: REPEATED TO VERIFY CONSISTENT WITH PREVIOUS RESULT   . Glucose-Capillary 08/29/2015 161* 65 -  99 mg/dL Final  . Comment 1 08/29/2015 Notify RN   Final  . Comment 2 08/29/2015 Document in Chart   Final  . Glucose-Capillary 08/30/2015 144* 65 - 99 mg/dL Final  . Specimen Description 08/30/2015 EXPECTORATED SPUTUM   Final  . Special Requests 08/30/2015 NONE   Final  . Gram Stain 08/30/2015    Final                   Value:MODERATE WBC PRESENT,BOTH PMN AND MONONUCLEAR RARE SQUAMOUS EPITHELIAL CELLS PRESENT FEW GRAM POSITIVE COCCI IN PAIRS IN CHAINS FEW GRAM NEGATIVE RODS Performed at [facility]   . Culture 08/30/2015    Final                   Value:NORMAL OROPHARYNGEAL FLORA Performed at [facility]   . Report Status 08/30/2015 09/01/2015 FINAL   Final  . Glucose-Capillary 08/30/2015 141* 65 - 99 mg/dL Final  . Comment 1 08/30/2015 Notify RN   Final  . Comment 2 08/30/2015 Document in Chart   Final  . Glucose-Capillary 08/30/2015 131* 65 - 99 mg/dL Final  . Comment 1 08/30/2015 Notify RN   Final  . Glucose-Capillary 08/30/2015 142* 65 - 99 mg/dL Final  . Comment 1 08/30/2015 Notify RN   Final  . Sodium 08/31/2015 137  135 - 145 mmol/L Final  . Potassium 08/31/2015 4.4  3.5 - 5.1 mmol/L Final  . Chloride 08/31/2015 104  101 - 111 mmol/L Final  . CO2 08/31/2015 25  22 - 32 mmol/L Final  . Glucose, Bld 08/31/2015 118* 65 - 99 mg/dL Final  . BUN 08/31/2015 40* 6 - 20 mg/dL Final  . Creatinine, Ser 08/31/2015 3.22* 0.61 - 1.24 mg/dL Final  . Calcium 08/31/2015 8.3* 8.9 - 10.3 mg/dL Final  . GFR calc non Af Amer 08/31/2015 17* >60 mL/min Final  . GFR calc Af Amer 08/31/2015 20* >60 mL/min Final   Comment: (NOTE) The eGFR has been calculated using the CKD EPI equation. This calculation has not been validated in all clinical situations. eGFR's persistently <60 mL/min  signify possible Chronic Kidney Disease.   . Anion gap 08/31/2015 8  5 - 15 Final  . WBC 08/31/2015 7.2  4.0 - 10.5 K/uL Final  . RBC 08/31/2015 2.98* 4.22 - 5.81 MIL/uL Final  . Hemoglobin  08/31/2015 8.1* 13.0 - 17.0 g/dL Final   CONSISTENT WITH PREVIOUS RESULT  . HCT 08/31/2015 25.9* 39.0 - 52.0 % Final  . MCV 08/31/2015 86.9  78.0 - 100.0 fL Final  . MCH 08/31/2015 27.2  26.0 - 34.0 pg Final  . MCHC 08/31/2015 31.3  30.0 - 36.0 g/dL Final  . RDW 08/31/2015 16.0* 11.5 - 15.5 % Final  . Platelets 08/31/2015 65* 150 - 400 K/uL Final   CONSISTENT WITH PREVIOUS RESULT  . Glucose-Capillary 08/30/2015 135* 65 - 99 mg/dL Final  . Glucose-Capillary 08/31/2015 109* 65 - 99 mg/dL Final  . Comment 1 08/31/2015 Notify RN   Final  . Comment 2 08/31/2015 Document in Chart   Final  . Glucose-Capillary 08/31/2015 139* 65 - 99 mg/dL Final  . Comment 1 08/31/2015 Notify RN   Final  . Glucose-Capillary 08/31/2015 160* 65 - 99 mg/dL Final  . WBC 09/01/2015 5.1  4.0 - 10.5 K/uL Final  . RBC 09/01/2015 3.27* 4.22 - 5.81 MIL/uL Final  . Hemoglobin 09/01/2015 8.7* 13.0 - 17.0 g/dL Final  . HCT 09/01/2015 28.3* 39.0 - 52.0 % Final  . MCV 09/01/2015 86.5  78.0 - 100.0 fL Final  . MCH 09/01/2015 26.6  26.0 - 34.0 pg Final  . MCHC 09/01/2015 30.7  30.0 - 36.0 g/dL Final  . RDW 09/01/2015 15.6* 11.5 - 15.5 % Final  . Platelets 09/01/2015 75* 150 - 400 K/uL Final   CONSISTENT WITH PREVIOUS RESULT  . Sodium 09/01/2015 139  135 - 145 mmol/L Final  . Potassium 09/01/2015 4.5  3.5 - 5.1 mmol/L Final  . Chloride 09/01/2015 105  101 - 111 mmol/L Final  . CO2 09/01/2015 25  22 - 32 mmol/L Final  . Glucose, Bld 09/01/2015 112* 65 - 99 mg/dL Final  . BUN 09/01/2015 41* 6 - 20 mg/dL Final  . Creatinine, Ser 09/01/2015 3.25* 0.61 - 1.24 mg/dL Final  . Calcium 09/01/2015 8.5* 8.9 - 10.3 mg/dL Final  . GFR calc non Af Amer 09/01/2015 17* >60 mL/min Final  . GFR calc Af Amer 09/01/2015 19* >60 mL/min Final   Comment: (NOTE) The eGFR has been calculated using the CKD EPI equation. This calculation has not been validated in all clinical situations. eGFR's persistently <60 mL/min signify possible Chronic  Kidney Disease.   . Anion gap 09/01/2015 9  5 - 15 Final  . Glucose-Capillary 08/31/2015 113* 65 - 99 mg/dL Final  . Comment 1 08/31/2015 Notify RN   Final  . Comment 2 08/31/2015 Document in Chart   Final  . Glucose-Capillary 09/01/2015 103* 65 - 99 mg/dL Final  . Comment 1 09/01/2015 Notify RN   Final  . Comment 2 09/01/2015 Document in Chart   Final  . Glucose-Capillary 09/01/2015 118* 65 - 99 mg/dL Final  . WBC 09/02/2015 5.3  4.0 - 10.5 K/uL Final  . RBC 09/02/2015 3.27* 4.22 - 5.81 MIL/uL Final  . Hemoglobin 09/02/2015 8.6* 13.0 - 17.0 g/dL Final  . HCT 09/02/2015 28.3* 39.0 - 52.0 % Final  . MCV 09/02/2015 86.5  78.0 - 100.0 fL Final  . MCH 09/02/2015 26.3  26.0 - 34.0 pg Final  . MCHC 09/02/2015 30.4  30.0 - 36.0 g/dL Final  . RDW 09/02/2015  15.5  11.5 - 15.5 % Final  . Platelets 09/02/2015 90* 150 - 400 K/uL Final   CONSISTENT WITH PREVIOUS RESULT  . Sodium 09/02/2015 139  135 - 145 mmol/L Final  . Potassium 09/02/2015 4.4  3.5 - 5.1 mmol/L Final  . Chloride 09/02/2015 107  101 - 111 mmol/L Final  . CO2 09/02/2015 24  22 - 32 mmol/L Final  . Glucose, Bld 09/02/2015 149* 65 - 99 mg/dL Final  . BUN 09/02/2015 39* 6 - 20 mg/dL Final  . Creatinine, Ser 09/02/2015 2.92* 0.61 - 1.24 mg/dL Final  . Calcium 09/02/2015 8.6* 8.9 - 10.3 mg/dL Final  . GFR calc non Af Amer 09/02/2015 19* >60 mL/min Final  . GFR calc Af Amer 09/02/2015 22* >60 mL/min Final   Comment: (NOTE) The eGFR has been calculated using the CKD EPI equation. This calculation has not been validated in all clinical situations. eGFR's persistently <60 mL/min signify possible Chronic Kidney Disease.   . Anion gap 09/02/2015 8  5 - 15 Final  . Glucose-Capillary 09/01/2015 144* 65 - 99 mg/dL Final  . Glucose-Capillary 09/01/2015 126* 65 - 99 mg/dL Final  . Comment 1 09/01/2015 Notify RN   Final  . Comment 2 09/01/2015 Document in Chart   Final  . Glucose-Capillary 09/02/2015 116* 65 - 99 mg/dL Final  . Comment 1  09/02/2015 Notify RN   Final  . Comment 2 09/02/2015 Document in Chart   Final  . Glucose-Capillary 09/02/2015 154* 65 - 99 mg/dL Final  . Comment 1 09/02/2015 Notify RN   Final  Admission on 07/31/2015, Discharged on 08/05/2015  No results displayed because visit has over 200 results.      Dg Chest 2 View  08/28/2015  CLINICAL DATA:  Short of breath EXAM: CHEST  2 VIEW COMPARISON:  11/23/2014 FINDINGS: There is hazy airspace disease in the right mid and lower lung zones. There is opacity at the left costophrenic angle. Cardiomegaly. Left upper lung zone is clear. No pneumothorax. IMPRESSION: Bilateral airspace disease suggesting bilateral bronchopneumonia or asymmetric edema. Electronically Signed   By: Marybelle Killings M.D.   On: 08/28/2015 13:52   Ct Head Wo Contrast  08/28/2015  CLINICAL DATA:  Headache.  Hypertension. EXAM: CT HEAD WITHOUT CONTRAST TECHNIQUE: Contiguous axial images were obtained from the base of the skull through the vertex without intravenous contrast. COMPARISON:  03/26/2013 FINDINGS: The brainstem, cerebellum, cerebral peduncles, thalami, basal ganglia, basilar cisterns, and ventricular system appear within normal limits. Periventricular white matter and corona radiata hypodensities favor chronic ischemic microvascular white matter disease. No intracranial hemorrhage, mass lesion, or acute CVA. Chronic left maxillary sinusitis noted with mild chronic ethmoid sinusitis. There is atherosclerotic calcification of the cavernous carotid arteries bilaterally. IMPRESSION: 1. No acute intracranial findings. 2. Periventricular white matter and corona radiata hypodensities favor chronic ischemic microvascular white matter disease. 3. Chronic left maxillary sinusitis and mild chronic ethmoid sinusitis. Electronically Signed   By: Van Clines M.D.   On: 08/28/2015 15:38   Ct Chest Wo Contrast  08/29/2015  CLINICAL DATA:  Hemoptysis. Shortness of breath and cough beginning yesterday.  EXAM: CT CHEST WITHOUT CONTRAST TECHNIQUE: Multidetector CT imaging of the chest was performed following the standard protocol without IV contrast. COMPARISON:  08/28/2015 chest radiographs. FINDINGS: There is a a borderline enlarged lower right paratracheal lymph node which measures 11 mm in short axis. No enlarged hilar lymph nodes are identified on this unenhanced study. Three vessel coronary artery and prominent thoracic aortic calcification  are noted. The heart is mildly enlarged. There are small bilateral pleural effusions. There are patchy consolidative and ground-glass opacities in the right upper lobe, right middle lobe, and both lower lobes, much of which is peribronchovascular in distribution. The central airways are patent. A 2.5 cm calcified stone is noted in the gallbladder. Thoracic spondylosis is noted. IMPRESSION: 1. Patchy right upper, right middle, and bilateral lower lobe opacities compatible with multifocal pneumonia. 2. Small bilateral pleural effusions. 3. Cholelithiasis. Electronically Signed   By: Logan Bores M.D.   On: 08/29/2015 13:55     Assessment/Plan   ICD-9-CM ICD-10-CM   1. Chronic acquired lymphedema 457.1 I89.0   2. Chronic kidney disease, stage 4 (severe) (HCC) 585.4 N18.4   3. Type 2 diabetes mellitus with stage 4 chronic kidney disease, with long-term current use of insulin (HCC) 250.40 E11.22    585.4 N18.4    V58.67 Z79.4    controlled  4. Chronic diastolic CHF (congestive heart failure) (HCC) 428.32 I50.32    428.0    5. Chronic atrial fibrillation (HCC) 427.31 I48.2   6. Cellulitis of left lower extremity 682.6 L03.116    resolving  7. Noncompliance with medications V15.81 Z91.14   8. Anemia, unspecified anemia type 285.9 D64.9     Check CBC w diff and BMP  Finish augmentin  PT/OT as indicated  Cont current meds as ordered  Fluid restrict 1800 cc per day  F/u with specialists as scheduled  Wound care as ordered. Wear unna boots  GOAL:  short term rehab and d/c home when medically appropriate. Communicated with pt and nursing.  Will follow  Nastassja Witkop S. Perlie Gold  [facility] [address] [place], [place] [address] [phone] Cell (Monday-Friday 8 AM - 5 PM) [phone] After 5 PM and follow prompts

## 2015-09-28 ENCOUNTER — Non-Acute Institutional Stay (SKILLED_NURSING_FACILITY): Payer: Medicare Other | Admitting: Internal Medicine

## 2015-09-28 ENCOUNTER — Encounter: Payer: Self-pay | Admitting: Internal Medicine

## 2015-09-28 DIAGNOSIS — I4892 Unspecified atrial flutter: Secondary | ICD-10-CM | POA: Diagnosis not present

## 2015-09-28 DIAGNOSIS — I890 Lymphedema, not elsewhere classified: Secondary | ICD-10-CM

## 2015-09-28 DIAGNOSIS — Z9114 Patient's other noncompliance with medication regimen: Secondary | ICD-10-CM

## 2015-09-28 DIAGNOSIS — N183 Chronic kidney disease, stage 3 unspecified: Secondary | ICD-10-CM

## 2015-09-28 DIAGNOSIS — Z794 Long term (current) use of insulin: Secondary | ICD-10-CM

## 2015-09-28 DIAGNOSIS — I5032 Chronic diastolic (congestive) heart failure: Secondary | ICD-10-CM

## 2015-09-28 DIAGNOSIS — E1165 Type 2 diabetes mellitus with hyperglycemia: Secondary | ICD-10-CM | POA: Diagnosis not present

## 2015-09-28 DIAGNOSIS — IMO0002 Reserved for concepts with insufficient information to code with codable children: Secondary | ICD-10-CM

## 2015-09-28 DIAGNOSIS — Z91148 Patient's other noncompliance with medication regimen for other reason: Secondary | ICD-10-CM

## 2015-09-28 DIAGNOSIS — E785 Hyperlipidemia, unspecified: Secondary | ICD-10-CM

## 2015-09-28 DIAGNOSIS — E1122 Type 2 diabetes mellitus with diabetic chronic kidney disease: Secondary | ICD-10-CM

## 2015-09-28 DIAGNOSIS — I10 Essential (primary) hypertension: Secondary | ICD-10-CM | POA: Diagnosis not present

## 2015-09-28 NOTE — Progress Notes (Signed)
Patient ID: [name], male   DOB: 1934/05/05, 82 y.o.   MRN: [id number]    DATE: 09/28/15  Location:  [facility]    Place of Service: SNF (31)   Extended Emergency Contact Information Primary Emergency Contact: Moultrie,Tami Address: [address]          [place], [place] [address] Johnnette Litter of [place] Phone: [phone] Mobile Phone: [phone] Relation: Daughter  Advanced Directive information  FULL CODE  Chief Complaint  Patient presents with  . Medical Management of Chronic Issues    HPI:  82 yo male seen today originallly for d/c but BP markedly elevated and d/c cancelled. He has been refusing meds and rehab since his admission. He states that he does not need his meds and that he feels fine. He reports that he does not want to do anything since "they will not hook up my TV". He has completed abx for LLE cellulitis and now is getting wound care to chronic wounds on legs.   HTN/ chronic HF- manual BP 200/90. Not taking bidil or lasix   Aflutter/afib - rate controled but not taking eliquis  CKD - stage 3  Chronic respiratory failure - has not needed nebs  Anemia - has refused iron supplements  DM - uncontrolled. Not taking insulin   Past Medical History  Diagnosis Date  . CHF (congestive heart failure) ([place])   . Diabetes mellitus without complication ([place])   . Hypertension   . Coronary artery disease   . Atrial fibrillation ([place])   . Chronic kidney disease     Past Surgical History  Procedure Laterality Date  . Leg surgery      Left leg surgery. broken leg  . Esophagogastroduodenoscopy  06/19/2012    Procedure: ESOPHAGOGASTRODUODENOSCOPY (EGD);  Surgeon: Beryle Beams, MD;  Location: [facility] ENDOSCOPY;  Service: Endoscopy;  Laterality: N/A;  . Colonoscopy  06/21/2012    Procedure: COLONOSCOPY;  Surgeon: Juanita Craver, MD;  Location: [facility] ENDOSCOPY;  Service: Endoscopy;  Laterality: N/A;  . Colonoscopy Left 02/13/2014   Procedure: COLONOSCOPY;  Surgeon: Juanita Craver, MD;  Location: [place] [facility];  Service: Endoscopy;  Laterality: Left;  . Laparoscopic right colectomy Right 02/15/2014    Procedure: LAPAROSCOPIC ASISSTED RIGHT  COLECTOMY, POSSIBLE OPEN;  Surgeon: Rolm Bookbinder, MD;  Location: [facility];  Service: General;  Laterality: Right;    Patient Care Team: Nolene Ebbs, MD as PCP - General (Internal Medicine)  Social History   Social History  . Marital Status: Widowed    Spouse Name: N/A  . Number of Children: N/A  . Years of Education: N/A   Occupational History  . Not on file.   Social History Main Topics  . Smoking status: Former Smoker    Quit date: 08/26/1998  . Smokeless tobacco: Never Used  . Alcohol Use: No     Comment: in the past  . Drug Use: No  . Sexual Activity: No   Other Topics Concern  . Not on file   Social History Narrative   Widower-   Has #2 children and #2 grandchildren and a great grandchild   Retired from [employer] (15 years ago)   Currently resides at [facility] and [place] with walker-     reports that he quit smoking about 17 years ago. He has never used smokeless tobacco. He reports that he does not drink alcohol or use illicit drugs.  Immunization History  Administered Date(s) Administered  . Influenza Split  06/25/2011  . Influenza, High Dose Seasonal PF 06/07/2013  . Pneumococcal Polysaccharide-23 02/25/2012, 06/20/2012  . Td 01/28/2005  . Tdap 10/20/2014    No Known Allergies  Medications: Patient's Medications  New Prescriptions   No medications on file  Previous Medications   ALBUTEROL (PROVENTIL) (2.5 MG/3ML) 0.083% NEBULIZER SOLUTION    Take 3 mLs (2.5 mg total) by nebulization every 4 (four) hours as needed for wheezing or shortness of breath.   AMOXICILLIN-CLAVULANATE (AUGMENTIN) 500-125 MG TABLET    Take 1 tablet (500 mg total) by mouth 2 (two) times daily with a meal. X 5days   APIXABAN (ELIQUIS) 2.5 MG TABS TABLET     Take 1 tablet (2.5 mg total) by mouth 2 (two) times daily.   BISACODYL (DULCOLAX) 10 MG SUPPOSITORY    Place 1 suppository (10 mg total) rectally as needed for moderate constipation.   FERROUS SULFATE 325 (65 FE) MG TABLET    Take 1 tablet (325 mg total) by mouth 2 (two) times daily with a meal.   FUROSEMIDE (LASIX) 40 MG TABLET    Take 1 tablet (40 mg total) by mouth daily.   INSULIN ASPART (NOVOLOG) 100 UNIT/ML INJECTION    Inject 0-15 Units into the skin 3 (three) times daily with meals. Sliding scale  CBG 70 - 120: 0 units: CBG 121 - 150: 2 units; CBG 151 - 200: 3 units; CBG 201 - 250: 5 units; CBG 251 - 300: 8 units;CBG 301 - 350: 11 units; CBG 351 - 400: 15 units; CBG > 400 : 15 units and notify MD   INSULIN GLARGINE (LANTUS) 100 UNIT/ML INJECTION    Inject 0.05 mLs (5 Units total) into the skin at bedtime.   ISOSORBIDE-HYDRALAZINE (BIDIL) 20-37.5 MG TABLET    Take 1 tablet by mouth 3 (three) times daily.   OXYCODONE-ACETAMINOPHEN (PERCOCET/ROXICET) 5-325 MG TABLET    Take 1 tablet by mouth every 6 (six) hours as needed for moderate pain or severe pain.   POLYETHYLENE GLYCOL (MIRALAX) PACKET    Take 17 g by mouth daily as needed.  Modified Medications   No medications on file  Discontinued Medications   No medications on file    Review of Systems  Cardiovascular: Positive for leg swelling.  Skin: Positive for wound.  All other systems reviewed and are negative.   Filed Vitals:   09/28/15 1551  BP: 195/88  Pulse: 68  Temp: 98.3 F (36.8 C)  SpO2: 97%   There is no weight on file to calculate BMI.  Physical Exam  Constitutional: He is oriented to person, place, and time. He appears well-developed and well-nourished. No distress.  Sitting on edge of bed in NAD. No conversational dyspnea  Musculoskeletal: He exhibits edema and tenderness.  Neurological: He is alert and oriented to person, place, and time.  Gait unsteady  Skin: Rash noted.  B/l leg dressings intact. No  secondary signs of infection  Psychiatric: His speech is normal. Thought content normal. His affect is angry. He is aggressive. He expresses inappropriate judgment.     Labs reviewed: Admission on 08/28/2015, Discharged on 09/02/2015  Component Date Value Ref Range Status  . Sodium 08/28/2015 139  135 - 145 mmol/L Final  . Potassium 08/28/2015 4.5  3.5 - 5.1 mmol/L Final  . Chloride 08/28/2015 107  101 - 111 mmol/L Final  . CO2 08/28/2015 23  22 - 32 mmol/L Final  . Glucose, Bld 08/28/2015 210* 65 - 99 mg/dL Final  . BUN 08/28/2015  22* 6 - 20 mg/dL Final  . Creatinine, Ser 08/28/2015 1.90* 0.61 - 1.24 mg/dL Final  . Calcium 08/28/2015 8.7* 8.9 - 10.3 mg/dL Final  . GFR calc non Af Amer 08/28/2015 32* >60 mL/min Final  . GFR calc Af Amer 08/28/2015 37* >60 mL/min Final   Comment: (NOTE) The eGFR has been calculated using the CKD EPI equation. This calculation has not been validated in all clinical situations. eGFR's persistently <60 mL/min signify possible Chronic Kidney Disease.   . Anion gap 08/28/2015 9  5 - 15 Final  . WBC 08/28/2015 18.7* 4.0 - 10.5 K/uL Final   REPEATED TO VERIFY  . RBC 08/28/2015 3.75* 4.22 - 5.81 MIL/uL Final  . Hemoglobin 08/28/2015 10.0* 13.0 - 17.0 g/dL Final   REPEATED TO VERIFY  . HCT 08/28/2015 32.5* 39.0 - 52.0 % Final  . MCV 08/28/2015 86.7  78.0 - 100.0 fL Final  . MCH 08/28/2015 26.7  26.0 - 34.0 pg Final  . MCHC 08/28/2015 30.8  30.0 - 36.0 g/dL Final  . RDW 08/28/2015 15.2  11.5 - 15.5 % Final  . Platelets 08/28/2015 80* 150 - 400 K/uL Final   Comment: REPEATED TO VERIFY PLATELET COUNT CONFIRMED BY SMEAR   . Troponin i, poc 08/28/2015 0.02  0.00 - 0.08 ng/mL Final  . Comment 3 08/28/2015          Final   Comment: Due to the release kinetics of cTnI, a negative result within the first hours of the onset of symptoms does not rule out myocardial infarction with certainty. If myocardial infarction is still suspected, repeat the test at  appropriate intervals.   . B Natriuretic Peptide 08/28/2015 779.2* 0.0 - 100.0 pg/mL Final  . Magnesium 08/28/2015 1.3* 1.7 - 2.4 mg/dL Final  . Phosphorus 08/28/2015 2.5  2.5 - 4.6 mg/dL Final  . Lactic Acid, Venous 08/28/2015 1.5  0.5 - 2.0 mmol/L Final  . Sodium 08/29/2015 137  135 - 145 mmol/L Final  . Potassium 08/29/2015 4.0  3.5 - 5.1 mmol/L Final  . Chloride 08/29/2015 106  101 - 111 mmol/L Final  . CO2 08/29/2015 24  22 - 32 mmol/L Final  . Glucose, Bld 08/29/2015 167* 65 - 99 mg/dL Final  . BUN 08/29/2015 24* 6 - 20 mg/dL Final  . Creatinine, Ser 08/29/2015 2.41* 0.61 - 1.24 mg/dL Final  . Calcium 08/29/2015 8.5* 8.9 - 10.3 mg/dL Final  . GFR calc non Af Amer 08/29/2015 24* >60 mL/min Final  . GFR calc Af Amer 08/29/2015 28* >60 mL/min Final   Comment: (NOTE) The eGFR has been calculated using the CKD EPI equation. This calculation has not been validated in all clinical situations. eGFR's persistently <60 mL/min signify possible Chronic Kidney Disease.   . Anion gap 08/29/2015 7  5 - 15 Final  . Glucose-Capillary 08/28/2015 155* 65 - 99 mg/dL Final  . Comment 1 08/28/2015 Notify RN   Final  . Comment 2 08/28/2015 Document in Chart   Final  . Glucose-Capillary 08/29/2015 157* 65 - 99 mg/dL Final  . Comment 1 08/29/2015 Notify RN   Final  . Comment 2 08/29/2015 Document in Chart   Final  . TSH 08/29/2015 0.931  0.350 - 4.500 uIU/mL Final  . Free T4 08/29/2015 1.28* 0.61 - 1.12 ng/dL Final  . Glucose-Capillary 08/29/2015 158* 65 - 99 mg/dL Final  . Comment 1 08/29/2015 Notify RN   Final  . Strep Pneumo Urinary Antigen 08/29/2015 NEGATIVE  NEGATIVE Final  Comment:        Infection due to S. pneumoniae cannot be absolutely ruled out since the antigen present may be below the detection limit of the test.   . Specimen Description 08/29/2015 URINE, RANDOM   Final  . Special Requests 08/29/2015 NONE   Final  . Legionella Antigen, Urine 08/29/2015    Final                    Value:Negative for Legionella pneumophila serogroup 1                                                              Legionella pneumophila serogroup 1 antigen can be detected in urine within 2 to 3 days of infection and may persist even after treatment. This  assay does not detect other Legionella species or serogroups. Performed at [facility]   . Report Status 08/29/2015 08/31/2015 FINAL   Final  . Specimen Description 08/30/2015 EXPECTORATED SPUTUM   Final  . Special Requests 08/30/2015 Normal   Final  . Sputum evaluation 08/30/2015 THIS SPECIMEN IS ACCEPTABLE. RESPIRATORY CULTURE REPORT TO FOLLOW.   Final  . Report Status 08/30/2015 08/30/2015 FINAL   Final  . Glucose-Capillary 08/29/2015 155* 65 - 99 mg/dL Final  . Comment 1 08/29/2015 Notify RN   Final  . Sodium 08/30/2015 136  135 - 145 mmol/L Final  . Potassium 08/30/2015 4.2  3.5 - 5.1 mmol/L Final  . Chloride 08/30/2015 104  101 - 111 mmol/L Final  . CO2 08/30/2015 24  22 - 32 mmol/L Final  . Glucose, Bld 08/30/2015 166* 65 - 99 mg/dL Final  . BUN 08/30/2015 32* 6 - 20 mg/dL Final  . Creatinine, Ser 08/30/2015 3.10* 0.61 - 1.24 mg/dL Final  . Calcium 08/30/2015 8.1* 8.9 - 10.3 mg/dL Final  . GFR calc non Af Amer 08/30/2015 18* >60 mL/min Final  . GFR calc Af Amer 08/30/2015 20* >60 mL/min Final   Comment: (NOTE) The eGFR has been calculated using the CKD EPI equation. This calculation has not been validated in all clinical situations. eGFR's persistently <60 mL/min signify possible Chronic Kidney Disease.   . Anion gap 08/30/2015 8  5 - 15 Final  . WBC 08/30/2015 15.4* 4.0 - 10.5 K/uL Final  . RBC 08/30/2015 2.90* 4.22 - 5.81 MIL/uL Final  . Hemoglobin 08/30/2015 8.0* 13.0 - 17.0 g/dL Final  . HCT 08/30/2015 25.1* 39.0 - 52.0 % Final  . MCV 08/30/2015 86.6  78.0 - 100.0 fL Final  . MCH 08/30/2015 27.6  26.0 - 34.0 pg Final  . MCHC 08/30/2015 31.9  30.0 - 36.0 g/dL Final  . RDW 08/30/2015 16.1* 11.5 - 15.5 %  Final  . Platelets 08/30/2015 62* 150 - 400 K/uL Final   Comment: REPEATED TO VERIFY CONSISTENT WITH PREVIOUS RESULT   . Glucose-Capillary 08/29/2015 161* 65 - 99 mg/dL Final  . Comment 1 08/29/2015 Notify RN   Final  . Comment 2 08/29/2015 Document in Chart   Final  . Glucose-Capillary 08/30/2015 144* 65 - 99 mg/dL Final  . Specimen Description 08/30/2015 EXPECTORATED SPUTUM   Final  . Special Requests 08/30/2015 NONE   Final  . Gram Stain 08/30/2015    Final  Value:MODERATE WBC PRESENT,BOTH PMN AND MONONUCLEAR RARE SQUAMOUS EPITHELIAL CELLS PRESENT FEW GRAM POSITIVE COCCI IN PAIRS IN CHAINS FEW GRAM NEGATIVE RODS Performed at [facility]   . Culture 08/30/2015    Final                   Value:NORMAL OROPHARYNGEAL FLORA Performed at [facility]   . Report Status 08/30/2015 09/01/2015 FINAL   Final  . Glucose-Capillary 08/30/2015 141* 65 - 99 mg/dL Final  . Comment 1 08/30/2015 Notify RN   Final  . Comment 2 08/30/2015 Document in Chart   Final  . Glucose-Capillary 08/30/2015 131* 65 - 99 mg/dL Final  . Comment 1 08/30/2015 Notify RN   Final  . Glucose-Capillary 08/30/2015 142* 65 - 99 mg/dL Final  . Comment 1 08/30/2015 Notify RN   Final  . Sodium 08/31/2015 137  135 - 145 mmol/L Final  . Potassium 08/31/2015 4.4  3.5 - 5.1 mmol/L Final  . Chloride 08/31/2015 104  101 - 111 mmol/L Final  . CO2 08/31/2015 25  22 - 32 mmol/L Final  . Glucose, Bld 08/31/2015 118* 65 - 99 mg/dL Final  . BUN 08/31/2015 40* 6 - 20 mg/dL Final  . Creatinine, Ser 08/31/2015 3.22* 0.61 - 1.24 mg/dL Final  . Calcium 08/31/2015 8.3* 8.9 - 10.3 mg/dL Final  . GFR calc non Af Amer 08/31/2015 17* >60 mL/min Final  . GFR calc Af Amer 08/31/2015 20* >60 mL/min Final   Comment: (NOTE) The eGFR has been calculated using the CKD EPI equation. This calculation has not been validated in all clinical situations. eGFR's persistently <60 mL/min signify possible Chronic  Kidney Disease.   . Anion gap 08/31/2015 8  5 - 15 Final  . WBC 08/31/2015 7.2  4.0 - 10.5 K/uL Final  . RBC 08/31/2015 2.98* 4.22 - 5.81 MIL/uL Final  . Hemoglobin 08/31/2015 8.1* 13.0 - 17.0 g/dL Final   CONSISTENT WITH PREVIOUS RESULT  . HCT 08/31/2015 25.9* 39.0 - 52.0 % Final  . MCV 08/31/2015 86.9  78.0 - 100.0 fL Final  . MCH 08/31/2015 27.2  26.0 - 34.0 pg Final  . MCHC 08/31/2015 31.3  30.0 - 36.0 g/dL Final  . RDW 08/31/2015 16.0* 11.5 - 15.5 % Final  . Platelets 08/31/2015 65* 150 - 400 K/uL Final   CONSISTENT WITH PREVIOUS RESULT  . Glucose-Capillary 08/30/2015 135* 65 - 99 mg/dL Final  . Glucose-Capillary 08/31/2015 109* 65 - 99 mg/dL Final  . Comment 1 08/31/2015 Notify RN   Final  . Comment 2 08/31/2015 Document in Chart   Final  . Glucose-Capillary 08/31/2015 139* 65 - 99 mg/dL Final  . Comment 1 08/31/2015 Notify RN   Final  . Glucose-Capillary 08/31/2015 160* 65 - 99 mg/dL Final  . WBC 09/01/2015 5.1  4.0 - 10.5 K/uL Final  . RBC 09/01/2015 3.27* 4.22 - 5.81 MIL/uL Final  . Hemoglobin 09/01/2015 8.7* 13.0 - 17.0 g/dL Final  . HCT 09/01/2015 28.3* 39.0 - 52.0 % Final  . MCV 09/01/2015 86.5  78.0 - 100.0 fL Final  . MCH 09/01/2015 26.6  26.0 - 34.0 pg Final  . MCHC 09/01/2015 30.7  30.0 - 36.0 g/dL Final  . RDW 09/01/2015 15.6* 11.5 - 15.5 % Final  . Platelets 09/01/2015 75* 150 - 400 K/uL Final   CONSISTENT WITH PREVIOUS RESULT  . Sodium 09/01/2015 139  135 - 145 mmol/L Final  . Potassium 09/01/2015 4.5  3.5 - 5.1 mmol/L Final  .  Chloride 09/01/2015 105  101 - 111 mmol/L Final  . CO2 09/01/2015 25  22 - 32 mmol/L Final  . Glucose, Bld 09/01/2015 112* 65 - 99 mg/dL Final  . BUN 09/01/2015 41* 6 - 20 mg/dL Final  . Creatinine, Ser 09/01/2015 3.25* 0.61 - 1.24 mg/dL Final  . Calcium 09/01/2015 8.5* 8.9 - 10.3 mg/dL Final  . GFR calc non Af Amer 09/01/2015 17* >60 mL/min Final  . GFR calc Af Amer 09/01/2015 19* >60 mL/min Final   Comment: (NOTE) The eGFR has  been calculated using the CKD EPI equation. This calculation has not been validated in all clinical situations. eGFR's persistently <60 mL/min signify possible Chronic Kidney Disease.   . Anion gap 09/01/2015 9  5 - 15 Final  . Glucose-Capillary 08/31/2015 113* 65 - 99 mg/dL Final  . Comment 1 08/31/2015 Notify RN   Final  . Comment 2 08/31/2015 Document in Chart   Final  . Glucose-Capillary 09/01/2015 103* 65 - 99 mg/dL Final  . Comment 1 09/01/2015 Notify RN   Final  . Comment 2 09/01/2015 Document in Chart   Final  . Glucose-Capillary 09/01/2015 118* 65 - 99 mg/dL Final  . WBC 09/02/2015 5.3  4.0 - 10.5 K/uL Final  . RBC 09/02/2015 3.27* 4.22 - 5.81 MIL/uL Final  . Hemoglobin 09/02/2015 8.6* 13.0 - 17.0 g/dL Final  . HCT 09/02/2015 28.3* 39.0 - 52.0 % Final  . MCV 09/02/2015 86.5  78.0 - 100.0 fL Final  . MCH 09/02/2015 26.3  26.0 - 34.0 pg Final  . MCHC 09/02/2015 30.4  30.0 - 36.0 g/dL Final  . RDW 09/02/2015 15.5  11.5 - 15.5 % Final  . Platelets 09/02/2015 90* 150 - 400 K/uL Final   CONSISTENT WITH PREVIOUS RESULT  . Sodium 09/02/2015 139  135 - 145 mmol/L Final  . Potassium 09/02/2015 4.4  3.5 - 5.1 mmol/L Final  . Chloride 09/02/2015 107  101 - 111 mmol/L Final  . CO2 09/02/2015 24  22 - 32 mmol/L Final  . Glucose, Bld 09/02/2015 149* 65 - 99 mg/dL Final  . BUN 09/02/2015 39* 6 - 20 mg/dL Final  . Creatinine, Ser 09/02/2015 2.92* 0.61 - 1.24 mg/dL Final  . Calcium 09/02/2015 8.6* 8.9 - 10.3 mg/dL Final  . GFR calc non Af Amer 09/02/2015 19* >60 mL/min Final  . GFR calc Af Amer 09/02/2015 22* >60 mL/min Final   Comment: (NOTE) The eGFR has been calculated using the CKD EPI equation. This calculation has not been validated in all clinical situations. eGFR's persistently <60 mL/min signify possible Chronic Kidney Disease.   . Anion gap 09/02/2015 8  5 - 15 Final  . Glucose-Capillary 09/01/2015 144* 65 - 99 mg/dL Final  . Glucose-Capillary 09/01/2015 126* 65 - 99 mg/dL  Final  . Comment 1 09/01/2015 Notify RN   Final  . Comment 2 09/01/2015 Document in Chart   Final  . Glucose-Capillary 09/02/2015 116* 65 - 99 mg/dL Final  . Comment 1 09/02/2015 Notify RN   Final  . Comment 2 09/02/2015 Document in Chart   Final  . Glucose-Capillary 09/02/2015 154* 65 - 99 mg/dL Final  . Comment 1 09/02/2015 Notify RN   Final  Admission on 07/31/2015, Discharged on 08/05/2015  No results displayed because visit has over 200 results.      No results found.   Assessment/Plan   ICD-9-CM ICD-10-CM   1. Noncompliance with medication regimen V15.81 Z91.14   2. Accelerated hypertension 401.0 I10  3. Chronic diastolic congestive heart failure (HCC) 428.32 I50.32    428.0    4. Uncontrolled type 2 diabetes mellitus with stage 3 chronic kidney disease, with long-term current use of insulin (HCC) 250.52 E11.22    585.3 E11.65    V58.67 N18.3     Z79.4   5. Hyperlipemia 272.4 E78.5   6. Chronic acquired lymphedema 457.1 I89.0   7. CKD (chronic kidney disease) stage 3, GFR 30-59 ml/min 585.3 N18.3   8. Atrial flutter, unspecified type ([place]) 427.32 I48.92     D/C CANCELLED DUE TO ELEVATED BP. Emphasized to pt importance of taking all meds as ordered to promote healing and prevent readmission into hospital. He stated, very rudely, that "I will just walk out of here"  If he stays, recommend he resume all meds as ordered and resume PT/OT which was stopped due to his noncompliance  Will follow  Emmerson Taddei S. Perlie Gold  [facility] [address] [place], [place] [address] [phone] Cell (Monday-Friday 8 AM - 5 PM) [phone] After 5 PM and follow prompts

## 2015-10-24 ENCOUNTER — Ambulatory Visit (INDEPENDENT_AMBULATORY_CARE_PROVIDER_SITE_OTHER): Payer: Medicare Other | Admitting: Podiatry

## 2015-10-24 NOTE — Progress Notes (Signed)
Patient ID: [name], male   DOB: December 24, 1933, 82 y.o.   MRN: [id number]   NO SHOW

## 2015-11-16 ENCOUNTER — Telehealth: Payer: Self-pay | Admitting: Internal Medicine

## 2015-11-16 NOTE — Telephone Encounter (Signed)
Received records from [place] for appointment with Dr Debara Pickett on 11/22/15.  Records given to [facility] (medical records) for Dr Lysbeth Penner schedule on 11/22/15.  lp

## 2015-11-22 ENCOUNTER — Telehealth: Payer: Self-pay | Admitting: Internal Medicine

## 2015-11-22 ENCOUNTER — Ambulatory Visit: Payer: Self-pay | Admitting: Internal Medicine

## 2015-11-22 NOTE — Telephone Encounter (Signed)
Forward to Dr Debara Pickett and Eliezer Lofts

## 2015-11-22 NOTE — Telephone Encounter (Signed)
New message   [name] is calling to let Dr. Debara Pickett know he is noncompliant with taking his medication  Amlodipine mg daily  Asprin 81mg  daily  Furosemide 40mg  2x day  BP   190/80

## 2015-11-22 NOTE — Telephone Encounter (Signed)
Ok .. Thanks for letting us know. Nothing we can do other than encourage him to take it.  Dr. Lemmie Evens

## 2015-11-22 NOTE — Telephone Encounter (Signed)
Patient was scheduled for MD OV 11/22/15 - no show for appt - rescheduled for 12/14/2015 Patient used to see Dr. Angelena Form.

## 2015-12-04 ENCOUNTER — Emergency Department (HOSPITAL_COMMUNITY)
Admission: EM | Admit: 2015-12-04 | Discharge: 2015-12-04 | Disposition: A | Discharge status: Home / Self Care | Payer: Medicare Other | Attending: Emergency Medicine | Admitting: Emergency Medicine

## 2015-12-04 ENCOUNTER — Encounter (HOSPITAL_COMMUNITY): Payer: Self-pay | Admitting: *Deleted

## 2015-12-04 DIAGNOSIS — R197 Diarrhea, unspecified: Secondary | ICD-10-CM | POA: Insufficient documentation

## 2015-12-04 DIAGNOSIS — I509 Heart failure, unspecified: Secondary | ICD-10-CM | POA: Insufficient documentation

## 2015-12-04 DIAGNOSIS — I2510 Atherosclerotic heart disease of native coronary artery without angina pectoris: Secondary | ICD-10-CM | POA: Diagnosis not present

## 2015-12-04 DIAGNOSIS — Z794 Long term (current) use of insulin: Secondary | ICD-10-CM | POA: Insufficient documentation

## 2015-12-04 DIAGNOSIS — I129 Hypertensive chronic kidney disease with stage 1 through stage 4 chronic kidney disease, or unspecified chronic kidney disease: Secondary | ICD-10-CM | POA: Insufficient documentation

## 2015-12-04 DIAGNOSIS — E119 Type 2 diabetes mellitus without complications: Secondary | ICD-10-CM | POA: Diagnosis not present

## 2015-12-04 DIAGNOSIS — Z79899 Other long term (current) drug therapy: Secondary | ICD-10-CM | POA: Insufficient documentation

## 2015-12-04 DIAGNOSIS — Z87891 Personal history of nicotine dependence: Secondary | ICD-10-CM | POA: Insufficient documentation

## 2015-12-04 DIAGNOSIS — N189 Chronic kidney disease, unspecified: Secondary | ICD-10-CM | POA: Insufficient documentation

## 2015-12-04 LAB — URINALYSIS, ROUTINE W REFLEX MICROSCOPIC
Bilirubin Urine: NEGATIVE
GLUCOSE, UA: 100 mg/dL — AB
Ketones, ur: NEGATIVE mg/dL
LEUKOCYTES UA: NEGATIVE
Nitrite: NEGATIVE
Protein, ur: >300 mg/dL — AB
SPECIFIC GRAVITY, URINE: 1.014 (ref 1.005–1.030)
pH: 6 (ref 5.0–8.0)

## 2015-12-04 LAB — URINE MICROSCOPIC-ADD ON: SQUAMOUS EPITHELIAL / LPF: NONE SEEN

## 2015-12-04 LAB — COMPREHENSIVE METABOLIC PANEL
ALT: 8 U/L — ABNORMAL LOW (ref 17–63)
ANION GAP: 10 (ref 5–15)
AST: 10 U/L — ABNORMAL LOW (ref 15–41)
Albumin: 3.4 g/dL — ABNORMAL LOW (ref 3.5–5.0)
Alkaline Phosphatase: 50 U/L (ref 38–126)
BUN: 32 mg/dL — ABNORMAL HIGH (ref 6–20)
CHLORIDE: 104 mmol/L (ref 101–111)
CO2: 22 mmol/L (ref 22–32)
Calcium: 8.8 mg/dL — ABNORMAL LOW (ref 8.9–10.3)
Creatinine, Ser: 2.55 mg/dL — ABNORMAL HIGH (ref 0.61–1.24)
GFR calc non Af Amer: 22 mL/min — ABNORMAL LOW (ref >60)
GFR, EST AFRICAN AMERICAN: 26 mL/min — AB (ref >60)
Glucose, Bld: 197 mg/dL — ABNORMAL HIGH (ref 65–99)
Potassium: 4.4 mmol/L (ref 3.5–5.1)
SODIUM: 136 mmol/L (ref 135–145)
Total Bilirubin: 0.8 mg/dL (ref 0.3–1.2)
Total Protein: 7.3 g/dL (ref 6.5–8.1)

## 2015-12-04 LAB — CBC
HCT: 31.6 % — ABNORMAL LOW (ref 39.0–52.0)
HEMOGLOBIN: 9.7 g/dL — AB (ref 13.0–17.0)
MCH: 24.1 pg — AB (ref 26.0–34.0)
MCHC: 30.7 g/dL (ref 30.0–36.0)
MCV: 78.4 fL (ref 78.0–100.0)
Platelets: 117 10*3/uL — ABNORMAL LOW (ref 150–400)
RBC: 4.03 MIL/uL — AB (ref 4.22–5.81)
RDW: 16 % — ABNORMAL HIGH (ref 11.5–15.5)
WBC: 5.1 10*3/uL (ref 4.0–10.5)

## 2015-12-04 LAB — LIPASE, BLOOD: LIPASE: 30 U/L (ref 11–51)

## 2015-12-04 NOTE — Care Management (Addendum)
CM contacted [facility] however, unable to reach the SW or Scientist, physiological.  [facility] CSW notified regarding possible disposition plan.

## 2015-12-04 NOTE — ED Notes (Addendum)
Pt reports having diarrhea since yesterday, denies vomiting. Was recently kicked out of nursing home and also needs assistance with finding a place to live. Has bandages on his feet that he reports are overdue to be changed.

## 2015-12-04 NOTE — Discharge Instructions (Signed)

## 2015-12-04 NOTE — Care Management (Signed)
ED CM met with patient at bedside, patient reports that he was recently evicted from [facility]. He admits to  not following the policy. CM will contact [facility] to confirm this information. CM and CSW will continue to follow for disposition.

## 2015-12-04 NOTE — ED Provider Notes (Signed)
CSN: [id number]     Arrival date & time 12/04/15  1348 History   First MD Initiated Contact with Patient 12/04/15 1645     Chief Complaint  Patient presents with  . Diarrhea     (Consider location/radiation/quality/duration/timing/severity/associated sxs/prior Treatment) Patient is a 82 y.o. male presenting with diarrhea. The history is provided by the patient.  Diarrhea Quality:  Semi-solid Severity:  Mild Onset quality:  Gradual Number of episodes:  2 Duration:  1 day Timing:  Constant Progression:  Unchanged Relieved by:  Nothing Worsened by:  Nothing tried Ineffective treatments:  None tried Associated symptoms: no abdominal pain, no arthralgias, no chills, no fever, no headaches, no myalgias and no vomiting   Risk factors: no recent antibiotic use    82 yo M With a chief complaint of having no one able to change his depends. Patient was released from a nursing home after being unable to pay for their services. He states that he refused to give them his money because he did not feel that they needed the amount that they're asking for. Since then he's been living with his daughter. Today he had 2 bowel movements on himself. He asked the home health nurse to change it. They told him that is not the responsibility and he needed to have his family changes. He then dialed 911.  Past Medical History  Diagnosis Date  . CHF (congestive heart failure) ([place])   . Diabetes mellitus without complication ([place])   . Hypertension   . Coronary artery disease   . Atrial fibrillation ([place])   . Chronic kidney disease    Past Surgical History  Procedure Laterality Date  . Leg surgery      Left leg surgery. broken leg  . Esophagogastroduodenoscopy  06/19/2012    Procedure: ESOPHAGOGASTRODUODENOSCOPY (EGD);  Surgeon: Beryle Beams, MD;  Location: [facility] ENDOSCOPY;  Service: Endoscopy;  Laterality: N/A;  . Colonoscopy  06/21/2012    Procedure: COLONOSCOPY;  Surgeon: Juanita Craver, MD;  Location: [facility]  ENDOSCOPY;  Service: Endoscopy;  Laterality: N/A;  . Colonoscopy Left 02/13/2014    Procedure: COLONOSCOPY;  Surgeon: Juanita Craver, MD;  Location: [place];  Service: Endoscopy;  Laterality: Left;  . Laparoscopic right colectomy Right 02/15/2014    Procedure: LAPAROSCOPIC ASISSTED RIGHT  COLECTOMY, POSSIBLE OPEN;  Surgeon: Rolm Bookbinder, MD;  Location: [place];  Service: General;  Laterality: Right;   Family History  Problem Relation Age of Onset  . Brain cancer Mother   . Diabetes type II Sister   . Narcolepsy Paternal Uncle    Social History  Substance Use Topics  . Smoking status: Former Smoker    Quit date: 08/26/1998  . Smokeless tobacco: Never Used  . Alcohol Use: No     Comment: in the past    Review of Systems  Constitutional: Negative for fever and chills.  HENT: Negative for congestion and facial swelling.   Eyes: Negative for discharge and visual disturbance.  Respiratory: Negative for shortness of breath.   Cardiovascular: Negative for chest pain and palpitations.  Gastrointestinal: Negative for vomiting, abdominal pain and diarrhea.  Musculoskeletal: Negative for myalgias and arthralgias.  Skin: Negative for color change and rash.  Neurological: Negative for tremors, syncope and headaches.  Psychiatric/Behavioral: Negative for confusion and dysphoric mood.      Allergies  Review of patient's allergies indicates no known allergies.  Home Medications   Prior to Admission medications   Medication Sig Start Date End Date Taking? Authorizing Provider  albuterol (PROVENTIL) (2.5 MG/3ML) 0.083% nebulizer solution Take 3 mLs (2.5 mg total) by nebulization every 4 (four) hours as needed for wheezing or shortness of breath. 09/02/15   Ripudeep Krystal Eaton, MD  amoxicillin-clavulanate (AUGMENTIN) 500-125 MG tablet Take 1 tablet (500 mg total) by mouth 2 (two) times daily with a meal. X 5days 09/02/15   Ripudeep Krystal Eaton, MD  apixaban (ELIQUIS) 2.5 MG TABS tablet Take 1 tablet  (2.5 mg total) by mouth 2 (two) times daily. 09/02/15   Ripudeep Krystal Eaton, MD  bisacodyl (DULCOLAX) 10 MG suppository Place 1 suppository (10 mg total) rectally as needed for moderate constipation. 09/02/15   Ripudeep Krystal Eaton, MD  ferrous sulfate 325 (65 FE) MG tablet Take 1 tablet (325 mg total) by mouth 2 (two) times daily with a meal. Patient not taking: Reported on 08/28/2015 11/29/14   Nishant Dhungel, MD  furosemide (LASIX) 40 MG tablet Take 1 tablet (40 mg total) by mouth daily. 09/04/15   Ripudeep Krystal Eaton, MD  insulin aspart (NOVOLOG) 100 UNIT/ML injection Inject 0-15 Units into the skin 3 (three) times daily with meals. Sliding scale  CBG 70 - 120: 0 units: CBG 121 - 150: 2 units; CBG 151 - 200: 3 units; CBG 201 - 250: 5 units; CBG 251 - 300: 8 units;CBG 301 - 350: 11 units; CBG 351 - 400: 15 units; CBG > 400 : 15 units and notify MD Patient not taking: Reported on 12/04/2015 09/02/15   Ripudeep K Rai, MD  insulin glargine (LANTUS) 100 UNIT/ML injection Inject 0.05 mLs (5 Units total) into the skin at bedtime. Patient not taking: Reported on 12/04/2015 09/02/15   Ripudeep Krystal Eaton, MD  isosorbide-hydrALAZINE (BIDIL) 20-37.5 MG tablet Take 1 tablet by mouth 3 (three) times daily. 09/02/15   Ripudeep Krystal Eaton, MD  oxyCODONE-acetaminophen (PERCOCET/ROXICET) 5-325 MG tablet Take 1 tablet by mouth every 6 (six) hours as needed for moderate pain or severe pain. 09/02/15   Ripudeep Krystal Eaton, MD  polyethylene glycol [facility]) packet Take 17 g by mouth daily as needed. 09/02/15   Ripudeep K Rai, MD   BP 185/76 mmHg  Pulse 72  Temp(Src) 98.4 F (36.9 C) (Oral)  Resp 18  SpO2 98% Physical Exam  Constitutional: He is oriented to person, place, and time. He appears well-developed and well-nourished.  HENT:  Head: Normocephalic and atraumatic.  Eyes: EOM are normal. Pupils are equal, round, and reactive to light.  Neck: Normal range of motion. Neck supple. No JVD present.  Cardiovascular: Normal rate and regular rhythm.  Exam reveals  no gallop and no friction rub.   No murmur heard. Pulmonary/Chest: No respiratory distress. He has no wheezes.  Abdominal: He exhibits no distension. There is no rebound and no guarding.  Musculoskeletal: Normal range of motion. He exhibits edema.  4+ lower extremity edema bilaterally. In unna boots  Neurological: He is alert and oriented to person, place, and time.  Skin: No rash noted. No pallor.  Psychiatric: He has a normal mood and affect. His behavior is normal.  Nursing note and vitals reviewed.   ED Course  Procedures (including critical care time) Labs Review Labs Reviewed  COMPREHENSIVE METABOLIC PANEL - Abnormal; Notable for the following:    Glucose, Bld 197 (*)    BUN 32 (*)    Creatinine, Ser 2.55 (*)    Calcium 8.8 (*)    Albumin 3.4 (*)    AST 10 (*)    ALT 8 (*)  GFR calc non Af Amer 22 (*)    GFR calc Af Amer 26 (*)    All other components within normal limits  CBC - Abnormal; Notable for the following:    RBC 4.03 (*)    Hemoglobin 9.7 (*)    HCT 31.6 (*)    MCH 24.1 (*)    RDW 16.0 (*)    Platelets 117 (*)    All other components within normal limits  URINALYSIS, ROUTINE W REFLEX MICROSCOPIC (NOT AT [facility]) - Abnormal; Notable for the following:    Glucose, UA 100 (*)    Hgb urine dipstick MODERATE (*)    Protein, ur >300 (*)    All other components within normal limits  URINE MICROSCOPIC-ADD ON - Abnormal; Notable for the following:    Bacteria, UA RARE (*)    Casts HYALINE CASTS (*)    All other components within normal limits  LIPASE, BLOOD    Imaging Review No results found. I have personally reviewed and evaluated these images and lab results as part of my medical decision-making.   EKG Interpretation None      MDM   Final diagnoses:  Diarrhea, unspecified type    82 yo M with a chief complaint of loose bowel movements. Patient had 2 of these this morning. No noted abdominal tenderness no fevers no white count. Hemoglobin is at  baseline. Denies bloody or dark stools. Social work consulted. They feel that there is nothing more significant to be added to his current regimen.  Upon further social work evaluation patient was found to have been threatening of the nursing home for eloping to play poker. Patient also has no current place to go to his he has been thrown out of his most recent living space. Recommend holding him in the ED and cell finding a place for him stay.  Deno Etienne, DO 12/04/15 1921

## 2015-12-08 ENCOUNTER — Inpatient Hospital Stay (HOSPITAL_COMMUNITY)
Admission: EM | Admit: 2015-12-08 | Discharge: 2015-12-12 | DRG: 065 | Disposition: A | Discharge status: Skilled Nursing Facility | Payer: Medicare Other | Attending: Internal Medicine | Admitting: Internal Medicine

## 2015-12-08 ENCOUNTER — Encounter (HOSPITAL_COMMUNITY): Payer: Self-pay

## 2015-12-08 ENCOUNTER — Emergency Department (HOSPITAL_COMMUNITY): Payer: Medicare Other

## 2015-12-08 ENCOUNTER — Observation Stay (HOSPITAL_COMMUNITY): Payer: Medicare Other

## 2015-12-08 DIAGNOSIS — I5032 Chronic diastolic (congestive) heart failure: Secondary | ICD-10-CM | POA: Diagnosis not present

## 2015-12-08 DIAGNOSIS — E1122 Type 2 diabetes mellitus with diabetic chronic kidney disease: Secondary | ICD-10-CM | POA: Diagnosis present

## 2015-12-08 DIAGNOSIS — D649 Anemia, unspecified: Secondary | ICD-10-CM

## 2015-12-08 DIAGNOSIS — Z794 Long term (current) use of insulin: Secondary | ICD-10-CM

## 2015-12-08 DIAGNOSIS — D61818 Other pancytopenia: Secondary | ICD-10-CM | POA: Diagnosis present

## 2015-12-08 DIAGNOSIS — D696 Thrombocytopenia, unspecified: Secondary | ICD-10-CM

## 2015-12-08 DIAGNOSIS — N184 Chronic kidney disease, stage 4 (severe): Secondary | ICD-10-CM

## 2015-12-08 DIAGNOSIS — E118 Type 2 diabetes mellitus with unspecified complications: Secondary | ICD-10-CM

## 2015-12-08 DIAGNOSIS — I5033 Acute on chronic diastolic (congestive) heart failure: Secondary | ICD-10-CM

## 2015-12-08 DIAGNOSIS — I4892 Unspecified atrial flutter: Secondary | ICD-10-CM | POA: Diagnosis present

## 2015-12-08 DIAGNOSIS — Z808 Family history of malignant neoplasm of other organs or systems: Secondary | ICD-10-CM

## 2015-12-08 DIAGNOSIS — W19XXXA Unspecified fall, initial encounter: Secondary | ICD-10-CM

## 2015-12-08 DIAGNOSIS — I2510 Atherosclerotic heart disease of native coronary artery without angina pectoris: Secondary | ICD-10-CM | POA: Diagnosis present

## 2015-12-08 DIAGNOSIS — R001 Bradycardia, unspecified: Secondary | ICD-10-CM | POA: Diagnosis not present

## 2015-12-08 DIAGNOSIS — I4891 Unspecified atrial fibrillation: Secondary | ICD-10-CM | POA: Diagnosis present

## 2015-12-08 DIAGNOSIS — I482 Chronic atrial fibrillation, unspecified: Secondary | ICD-10-CM

## 2015-12-08 DIAGNOSIS — I890 Lymphedema, not elsewhere classified: Secondary | ICD-10-CM

## 2015-12-08 DIAGNOSIS — I6789 Other cerebrovascular disease: Secondary | ICD-10-CM | POA: Diagnosis present

## 2015-12-08 DIAGNOSIS — I483 Typical atrial flutter: Secondary | ICD-10-CM

## 2015-12-08 DIAGNOSIS — E785 Hyperlipidemia, unspecified: Secondary | ICD-10-CM | POA: Diagnosis present

## 2015-12-08 DIAGNOSIS — L03116 Cellulitis of left lower limb: Secondary | ICD-10-CM

## 2015-12-08 DIAGNOSIS — N183 Chronic kidney disease, stage 3 unspecified: Secondary | ICD-10-CM

## 2015-12-08 DIAGNOSIS — E1165 Type 2 diabetes mellitus with hyperglycemia: Secondary | ICD-10-CM | POA: Diagnosis present

## 2015-12-08 DIAGNOSIS — E1121 Type 2 diabetes mellitus with diabetic nephropathy: Secondary | ICD-10-CM | POA: Diagnosis present

## 2015-12-08 DIAGNOSIS — Z9119 Patient's noncompliance with other medical treatment and regimen: Secondary | ICD-10-CM

## 2015-12-08 DIAGNOSIS — I638 Other cerebral infarction: Secondary | ICD-10-CM | POA: Diagnosis not present

## 2015-12-08 DIAGNOSIS — N179 Acute kidney failure, unspecified: Secondary | ICD-10-CM

## 2015-12-08 DIAGNOSIS — E119 Type 2 diabetes mellitus without complications: Secondary | ICD-10-CM

## 2015-12-08 DIAGNOSIS — S0081XA Abrasion of other part of head, initial encounter: Secondary | ICD-10-CM | POA: Diagnosis present

## 2015-12-08 DIAGNOSIS — D638 Anemia in other chronic diseases classified elsewhere: Secondary | ICD-10-CM | POA: Diagnosis present

## 2015-12-08 DIAGNOSIS — Z833 Family history of diabetes mellitus: Secondary | ICD-10-CM

## 2015-12-08 DIAGNOSIS — I878 Other specified disorders of veins: Secondary | ICD-10-CM | POA: Diagnosis present

## 2015-12-08 DIAGNOSIS — I10 Essential (primary) hypertension: Secondary | ICD-10-CM

## 2015-12-08 DIAGNOSIS — I639 Cerebral infarction, unspecified: Secondary | ICD-10-CM

## 2015-12-08 DIAGNOSIS — I672 Cerebral atherosclerosis: Secondary | ICD-10-CM | POA: Diagnosis present

## 2015-12-08 DIAGNOSIS — D631 Anemia in chronic kidney disease: Secondary | ICD-10-CM

## 2015-12-08 DIAGNOSIS — R0902 Hypoxemia: Secondary | ICD-10-CM

## 2015-12-08 DIAGNOSIS — Z6832 Body mass index (BMI) 32.0-32.9, adult: Secondary | ICD-10-CM

## 2015-12-08 DIAGNOSIS — W07XXXA Fall from chair, initial encounter: Secondary | ICD-10-CM | POA: Diagnosis present

## 2015-12-08 DIAGNOSIS — Z87891 Personal history of nicotine dependence: Secondary | ICD-10-CM

## 2015-12-08 DIAGNOSIS — E669 Obesity, unspecified: Secondary | ICD-10-CM | POA: Diagnosis present

## 2015-12-08 DIAGNOSIS — IMO0002 Reserved for concepts with insufficient information to code with codable children: Secondary | ICD-10-CM

## 2015-12-08 DIAGNOSIS — Z9114 Patient's other noncompliance with medication regimen: Secondary | ICD-10-CM

## 2015-12-08 DIAGNOSIS — I130 Hypertensive heart and chronic kidney disease with heart failure and stage 1 through stage 4 chronic kidney disease, or unspecified chronic kidney disease: Secondary | ICD-10-CM | POA: Diagnosis present

## 2015-12-08 LAB — BASIC METABOLIC PANEL
Anion gap: 9 (ref 5–15)
BUN: 24 mg/dL — AB (ref 6–20)
CHLORIDE: 107 mmol/L (ref 101–111)
CO2: 25 mmol/L (ref 22–32)
CREATININE: 2.33 mg/dL — AB (ref 0.61–1.24)
Calcium: 8.6 mg/dL — ABNORMAL LOW (ref 8.9–10.3)
GFR calc Af Amer: 29 mL/min — ABNORMAL LOW (ref >60)
GFR calc non Af Amer: 25 mL/min — ABNORMAL LOW (ref >60)
Glucose, Bld: 148 mg/dL — ABNORMAL HIGH (ref 65–99)
Potassium: 4.8 mmol/L (ref 3.5–5.1)
SODIUM: 141 mmol/L (ref 135–145)

## 2015-12-08 LAB — CBC
HCT: 32.1 % — ABNORMAL LOW (ref 39.0–52.0)
Hemoglobin: 9.8 g/dL — ABNORMAL LOW (ref 13.0–17.0)
MCH: 24.6 pg — AB (ref 26.0–34.0)
MCHC: 30.5 g/dL (ref 30.0–36.0)
MCV: 80.5 fL (ref 78.0–100.0)
PLATELETS: 141 10*3/uL — AB (ref 150–400)
RBC: 3.99 MIL/uL — ABNORMAL LOW (ref 4.22–5.81)
RDW: 16.8 % — AB (ref 11.5–15.5)
WBC: 6.7 10*3/uL (ref 4.0–10.5)

## 2015-12-08 LAB — LIPID PANEL
Cholesterol: 157 mg/dL (ref 0–200)
HDL: 33 mg/dL — ABNORMAL LOW (ref >40)
LDL CALC: 109 mg/dL — AB (ref 0–99)
Total CHOL/HDL Ratio: 4.8 RATIO
Triglycerides: 77 mg/dL (ref <150)
VLDL: 15 mg/dL (ref 0–40)

## 2015-12-08 LAB — GLUCOSE, CAPILLARY
GLUCOSE-CAPILLARY: 112 mg/dL — AB (ref 65–99)
Glucose-Capillary: 121 mg/dL — ABNORMAL HIGH (ref 65–99)

## 2015-12-08 LAB — I-STAT TROPONIN, ED: Troponin i, poc: 0.03 ng/mL (ref 0.00–0.08)

## 2015-12-08 LAB — CBG MONITORING, ED: GLUCOSE-CAPILLARY: 127 mg/dL — AB (ref 65–99)

## 2015-12-08 LAB — BRAIN NATRIURETIC PEPTIDE: B Natriuretic Peptide: 236.2 pg/mL — ABNORMAL HIGH (ref 0.0–100.0)

## 2015-12-08 LAB — TSH: TSH: 1.265 u[IU]/mL (ref 0.350–4.500)

## 2015-12-08 LAB — TROPONIN I: TROPONIN I: 0.04 ng/mL — AB (ref <0.031)

## 2015-12-08 MED ORDER — ONDANSETRON HCL 4 MG PO TABS: 4.0000 mg | ORAL_TABLET | Freq: Four times a day (QID) | ORAL | Status: DC | PRN

## 2015-12-08 MED ORDER — FUROSEMIDE 40 MG PO TABS
40.0000 mg | ORAL_TABLET | Freq: Every day | ORAL | Status: DC
  Administered 2015-12-08 – 2015-12-12 (×5): 40 mg via ORAL
  Filled 2015-12-08 (×5): qty 1

## 2015-12-08 MED ORDER — BISACODYL 10 MG RE SUPP: 10.0000 mg | RECTAL | Status: DC | PRN

## 2015-12-08 MED ORDER — AMLODIPINE BESYLATE 5 MG PO TABS
5.0000 mg | ORAL_TABLET | Freq: Every day | ORAL | Status: DC
  Administered 2015-12-08: 5 mg via ORAL
  Filled 2015-12-08: qty 1

## 2015-12-08 MED ORDER — ACETAMINOPHEN 325 MG PO TABS
650.0000 mg | ORAL_TABLET | Freq: Four times a day (QID) | ORAL | Status: DC | PRN
  Administered 2015-12-12: 650 mg via ORAL
  Filled 2015-12-08: qty 2

## 2015-12-08 MED ORDER — ONDANSETRON HCL 4 MG/2ML IJ SOLN: 4.0000 mg | Freq: Four times a day (QID) | INTRAMUSCULAR | Status: DC | PRN

## 2015-12-08 MED ORDER — INSULIN ASPART 100 UNIT/ML ~~LOC~~ SOLN
0.0000 [IU] | Freq: Every day | SUBCUTANEOUS | Status: DC
  Administered 2015-12-10: 2 [IU] via SUBCUTANEOUS

## 2015-12-08 MED ORDER — APIXABAN 2.5 MG PO TABS: 2.5000 mg | ORAL_TABLET | Freq: Two times a day (BID) | ORAL | Status: DC

## 2015-12-08 MED ORDER — ACETAMINOPHEN 650 MG RE SUPP: 650.0000 mg | Freq: Four times a day (QID) | RECTAL | Status: DC | PRN

## 2015-12-08 MED ORDER — ALBUTEROL SULFATE (2.5 MG/3ML) 0.083% IN NEBU: 2.5000 mg | INHALATION_SOLUTION | RESPIRATORY_TRACT | Status: DC | PRN

## 2015-12-08 MED ORDER — HYDRALAZINE HCL 20 MG/ML IJ SOLN
5.0000 mg | INTRAMUSCULAR | Status: DC | PRN
  Administered 2015-12-08: 5 mg via INTRAVENOUS
  Filled 2015-12-08: qty 1

## 2015-12-08 MED ORDER — INSULIN GLARGINE 100 UNIT/ML ~~LOC~~ SOLN
5.0000 [IU] | Freq: Every day | SUBCUTANEOUS | Status: DC
  Administered 2015-12-08 – 2015-12-11 (×4): 5 [IU] via SUBCUTANEOUS
  Filled 2015-12-08 (×6): qty 0.05

## 2015-12-08 MED ORDER — SODIUM CHLORIDE 0.9% FLUSH
3.0000 mL | Freq: Two times a day (BID) | INTRAVENOUS | Status: DC
  Administered 2015-12-08 – 2015-12-11 (×8): 3 mL via INTRAVENOUS

## 2015-12-08 MED ORDER — BISACODYL 10 MG RE SUPP: 10.0000 mg | Freq: Every day | RECTAL | Status: DC | PRN

## 2015-12-08 MED ORDER — SODIUM CHLORIDE 0.9 % IV SOLN: INTRAVENOUS | Status: AC

## 2015-12-08 MED ORDER — OXYCODONE-ACETAMINOPHEN 5-325 MG PO TABS: 1.0000 | ORAL_TABLET | Freq: Four times a day (QID) | ORAL | Status: DC | PRN

## 2015-12-08 MED ORDER — POLYETHYLENE GLYCOL 3350 17 G PO PACK: 17.0000 g | PACK | Freq: Every day | ORAL | Status: DC | PRN

## 2015-12-08 MED ORDER — ISOSORB DINITRATE-HYDRALAZINE 20-37.5 MG PO TABS
1.0000 | ORAL_TABLET | Freq: Three times a day (TID) | ORAL | Status: DC
  Administered 2015-12-08 – 2015-12-09 (×5): 1 via ORAL
  Filled 2015-12-08 (×7): qty 1

## 2015-12-08 MED ORDER — INSULIN ASPART 100 UNIT/ML ~~LOC~~ SOLN
0.0000 [IU] | Freq: Three times a day (TID) | SUBCUTANEOUS | Status: DC
  Administered 2015-12-09: 1 [IU] via SUBCUTANEOUS
  Administered 2015-12-09 (×2): 2 [IU] via SUBCUTANEOUS
  Administered 2015-12-10: 1 [IU] via SUBCUTANEOUS
  Administered 2015-12-10 (×2): 2 [IU] via SUBCUTANEOUS
  Administered 2015-12-11: 1 [IU] via SUBCUTANEOUS
  Administered 2015-12-11: 2 [IU] via SUBCUTANEOUS
  Administered 2015-12-11: 1 [IU] via SUBCUTANEOUS
  Administered 2015-12-12: 3 [IU] via SUBCUTANEOUS

## 2015-12-08 MED ORDER — SENNOSIDES-DOCUSATE SODIUM 8.6-50 MG PO TABS: 1.0000 | ORAL_TABLET | Freq: Every evening | ORAL | Status: DC | PRN

## 2015-12-08 NOTE — ED Provider Notes (Signed)
CSN: [id number]     Arrival date & time 12/08/15  1300 History   First MD Initiated Contact with Patient 12/08/15 1310     Chief Complaint  Patient presents with  . Fall  . Bradycardia     (Consider location/radiation/quality/duration/timing/severity/associated sxs/prior Treatment) The history is provided by the patient, the EMS personnel and medical records. History limited by: Pt non-compliant with answering history questions,     Patient is a 82 year old male with history of chronic dHF, IDDM, HTN, A fib/ A flutter, CAD, CKD , severe venous stasis/ lymphedema, colon cancer in 2015 s/p surgery lymphadema, history of medical noncompliance, who presents to the emergency room from home after he fell forward out of his recliner hitting his head on the carpeted ground. He denies any loss of consciousness. EMS arrived and found him to be hypertensive and bradycardic, and he was transported to the emergency room.  In the ER patient denies any recent illness or new symptoms. He denies chest pain, shortness of breath, lightheadedness, syncope, HA, neck pain, back pain. He states that he had not taken his morning medications yet for blood pressure and Lasix which he does not want to take because it makes him "piss all over himself."  Patient does not know what medications he takes otherwise, when asked what meds he takes he states, "I don't know."  He also states he does not know who his cardiologist is, and is non-compliant with lending any other medical history.  He continues to state that he has no complaints, and that he "had a busy day planned, I was getting ready for it, but now it's ruined."    Remaining history is obtained from chart review. Patient has had multiple admissions for congestive heart failure and respiratory failure. In January he was admitted with fluid overload versus pneumonia, was found be in A flutter and was bradycardic. At that time his beta blocker was discontinued due to  bradycardia and he was put on Eliquis.  It is unclear if patient is currently compliant with medications as he has non-compliant history, there are recent notations from cardiology of missed appointments and non-compliance to meds, and he is not answering questions this morning.  In January he was discharged to a skilled nursing facility but was apparently kicked out for breaking policy/rules.     Past Medical History  Diagnosis Date  . CHF (congestive heart failure) ([place])   . Diabetes mellitus without complication ([place])   . Hypertension   . Coronary artery disease   . Atrial fibrillation ([place])   . Chronic kidney disease    Past Surgical History  Procedure Laterality Date  . Leg surgery      Left leg surgery. broken leg  . Esophagogastroduodenoscopy  06/19/2012    Procedure: ESOPHAGOGASTRODUODENOSCOPY (EGD);  Surgeon: Beryle Beams, MD;  Location: [facility] ENDOSCOPY;  Service: Endoscopy;  Laterality: N/A;  . Colonoscopy  06/21/2012    Procedure: COLONOSCOPY;  Surgeon: Juanita Craver, MD;  Location: [facility] ENDOSCOPY;  Service: Endoscopy;  Laterality: N/A;  . Colonoscopy Left 02/13/2014    Procedure: COLONOSCOPY;  Surgeon: Juanita Craver, MD;  Location: [place];  Service: Endoscopy;  Laterality: Left;  . Laparoscopic right colectomy Right 02/15/2014    Procedure: LAPAROSCOPIC ASISSTED RIGHT  COLECTOMY, POSSIBLE OPEN;  Surgeon: Rolm Bookbinder, MD;  Location: [place];  Service: General;  Laterality: Right;   Family History  Problem Relation Age of Onset  . Brain cancer Mother   . Diabetes type II Sister   .  Narcolepsy Paternal Uncle    Social History  Substance Use Topics  . Smoking status: Former Smoker    Quit date: 08/26/1998  . Smokeless tobacco: Never Used  . Alcohol Use: No     Comment: in the past    Review of Systems  Unable to perform ROS: Other (Pt non-compliant with giving history, not answering questions)  All other systems reviewed and are negative.     Allergies  Review  of patient's allergies indicates no known allergies.  Home Medications   Prior to Admission medications   Medication Sig Start Date End Date Taking? Authorizing Provider  albuterol (PROVENTIL) (2.5 MG/3ML) 0.083% nebulizer solution Take 3 mLs (2.5 mg total) by nebulization every 4 (four) hours as needed for wheezing or shortness of breath. 09/02/15   Ripudeep Krystal Eaton, MD  amoxicillin-clavulanate (AUGMENTIN) 500-125 MG tablet Take 1 tablet (500 mg total) by mouth 2 (two) times daily with a meal. X 5days 09/02/15   Ripudeep Krystal Eaton, MD  apixaban (ELIQUIS) 2.5 MG TABS tablet Take 1 tablet (2.5 mg total) by mouth 2 (two) times daily. 09/02/15   Ripudeep Krystal Eaton, MD  bisacodyl (DULCOLAX) 10 MG suppository Place 1 suppository (10 mg total) rectally as needed for moderate constipation. 09/02/15   Ripudeep Krystal Eaton, MD  ferrous sulfate 325 (65 FE) MG tablet Take 1 tablet (325 mg total) by mouth 2 (two) times daily with a meal. Patient not taking: Reported on 08/28/2015 11/29/14   Nishant Dhungel, MD  furosemide (LASIX) 40 MG tablet Take 1 tablet (40 mg total) by mouth daily. 09/04/15   Ripudeep Krystal Eaton, MD  insulin aspart (NOVOLOG) 100 UNIT/ML injection Inject 0-15 Units into the skin 3 (three) times daily with meals. Sliding scale  CBG 70 - 120: 0 units: CBG 121 - 150: 2 units; CBG 151 - 200: 3 units; CBG 201 - 250: 5 units; CBG 251 - 300: 8 units;CBG 301 - 350: 11 units; CBG 351 - 400: 15 units; CBG > 400 : 15 units and notify MD Patient not taking: Reported on 12/04/2015 09/02/15   Ripudeep K Rai, MD  insulin glargine (LANTUS) 100 UNIT/ML injection Inject 0.05 mLs (5 Units total) into the skin at bedtime. Patient not taking: Reported on 12/04/2015 09/02/15   Ripudeep Krystal Eaton, MD  isosorbide-hydrALAZINE (BIDIL) 20-37.5 MG tablet Take 1 tablet by mouth 3 (three) times daily. 09/02/15   Ripudeep Krystal Eaton, MD  oxyCODONE-acetaminophen (PERCOCET/ROXICET) 5-325 MG tablet Take 1 tablet by mouth every 6 (six) hours as needed for moderate pain or  severe pain. 09/02/15   Ripudeep Krystal Eaton, MD  polyethylene glycol [facility]) packet Take 17 g by mouth daily as needed. 09/02/15   Ripudeep K Rai, MD   BP 209/69 mmHg  Pulse 49  Temp(Src) 98.8 F (37.1 C) (Oral)  Resp 17  SpO2 97% Physical Exam  Constitutional: He is oriented to person, place, and time. He appears well-developed and well-nourished.  HENT:  Head: Normocephalic. Head is with abrasion. Head is without right periorbital erythema and without left periorbital erythema.  Nose: Nose normal.  Mouth/Throat: Oropharynx is clear and moist.  Eyes: Conjunctivae and EOM are normal. Pupils are equal, round, and reactive to light.  Neck: Normal range of motion. JVD present.  Cardiovascular: An irregular rhythm present. Frequent extrasystoles are present. Bradycardia present.  Exam reveals gallop. Exam reveals no friction rub.   No murmur heard. Pulses:      Radial pulses are 2+ on the right side,  and 2+ on the left side.  Pulmonary/Chest: Effort normal. No respiratory distress. He has no wheezes. He exhibits no tenderness.  Abdominal: Soft. Bowel sounds are normal. He exhibits no distension. There is no tenderness.  Musculoskeletal: Normal range of motion. He exhibits no tenderness.       Cervical back: Normal. He exhibits normal range of motion, no tenderness and no bony tenderness.  Neurological: He is alert and oriented to person, place, and time. He has normal strength. He is not disoriented. He displays no tremor. No sensory deficit. He exhibits normal muscle tone. Coordination normal. GCS eye subscore is 4. GCS verbal subscore is 5. GCS motor subscore is 6.  5/5 Symmetrical grip strength and symmetrical LE dorsiflexion and plantarflexion Gait deferred  Skin: Skin is warm. He is not diaphoretic. There is erythema.  Dressings applied to bilateral LE, mildly edematous and erythematous shins  Psychiatric: His speech is normal. He is agitated.  Nursing note and vitals reviewed.   ED  Course  Procedures (including critical care time) Labs Review Labs Reviewed  BASIC METABOLIC PANEL - Abnormal; Notable for the following:    Glucose, Bld 148 (*)    BUN 24 (*)    Creatinine, Ser 2.33 (*)    Calcium 8.6 (*)    GFR calc non Af Amer 25 (*)    GFR calc Af Amer 29 (*)    All other components within normal limits  CBC - Abnormal; Notable for the following:    RBC 3.99 (*)    Hemoglobin 9.8 (*)    HCT 32.1 (*)    MCH 24.6 (*)    RDW 16.8 (*)    Platelets 141 (*)    All other components within normal limits  BRAIN NATRIURETIC PEPTIDE - Abnormal; Notable for the following:    B Natriuretic Peptide 236.2 (*)    All other components within normal limits  CBG MONITORING, ED - Abnormal; Notable for the following:    Glucose-Capillary 127 (*)    All other components within normal limits  TSH  I-STAT TROPOININ, ED  CBG MONITORING, ED    Imaging Review Ct Head Wo Contrast  12/08/2015  CLINICAL DATA:  Pain following fall EXAM: CT HEAD WITHOUT CONTRAST TECHNIQUE: Contiguous axial images were obtained from the base of the skull through the vertex without intravenous contrast. COMPARISON:  August 28, 2015 FINDINGS: There is age related volume loss. There is no intracranial mass, hemorrhage, extra-axial fluid collection, or midline shift. There is patchy small vessel disease in the centra semiovale bilaterally. There is a focal infarct in the lateral right thalamus near the junction with the posterior limb of the right internal capsule, not present previously and potentially representing a small recent infarct. Elsewhere gray-white compartments appear normal. The bony calvarium appears intact. The mastoid air cells are clear. No intraorbital lesions are evident. 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 This focus of decreased attenuation which was not appreciable 3 months prior measures 7  x 7 mm. Elsewhere there is age related volume loss with patchy periventricular small vessel disease. No hemorrhage or mass effect. No extra-axial fluid collection. Electronically Signed   By: Lowella Grip III M.D.   On: 12/08/2015 14:33   Dg Chest Port 1 View  12/08/2015  CLINICAL DATA:  Patient fell from is recliner striking a carpeted surface without complaint of injury; patient found to be hypertensive and bradycardic in  and in atrial fibrillation. History of CHF, diabetes, and chronic renal insufficiency. EXAM: PORTABLE CHEST 1 VIEW COMPARISON:  Chest x-ray of August 28, 2015 and CT scan of the chest of August 29, 2015. FINDINGS: The lungs are reasonably well inflated. The interstitial markings are mildly prominent but less conspicuous than in the past. The cardiac silhouette remains enlarged. The pulmonary vascularity is mildly prominent centrally but also less conspicuous than in January. No pleural effusion is observed. IMPRESSION: Cardiomegaly with mild central pulmonary vascularity without definite peripheral edema or pleural effusion. No pneumonia is evident. Electronically Signed   By: David  Martinique M.D.   On: 12/08/2015 13:45   I have personally reviewed and evaluated these images and lab results as part of my medical decision-making.   EKG Interpretation None      MDM   Pt with fall out of chair at home, EMS arrived found him hypertensive and bradycardic.  He denies LOC, head or neck pain.  He has mild, but obvious abrasions to forehead.  EKG shows bradycardia, A flutter.  Patient states he has not taken his morning medications although he cannot state what they are. He asked for me pills he takes he states "I don't know." When asked who his cardiologist as he states "I do not know."  He denies any chest pain, shortness of breath, headache, neck pain, lightheadedness.    In chart, patient's admission in January, he had similar bradycardic rhythm and at that time, coreg was  discontinued, and patient was placed on Eliquis. Is currently unclear if patient is compliant currently with Eliquis. There is extensive notation in chart regarding patient's medical noncompliance, missed visits, etc.    Head CT was obtained which is negative for hemorrhage, mass effect. It is significant for probable recent infarct in the lateral right thalamus.  Portable chest x-ray pertinent for mild pulmonary vascularity, less than recent admission in January, and patient was admitted for CHF vs CAP.  BNP was 236.2 (Jan 779).  I-STAT troponin 0.03.    Hospitalist to admit Request Cards consult  Dx:  Fall, bradycardia, possible CVA Final diagnoses:  None        Delsa Grana, PA-C 12/13/15 1056  Pattricia Boss, MD 12/18/15 1243

## 2015-12-08 NOTE — ED Notes (Signed)
Per EMS, Pt is coming from home. Pt was getting out of his recliner when he fell forward and hit the carpet on the ground. Pt denies any injuries from fall. When EMS arrived, pt was HTN and Bradycardic. Pt was found to be in Afib with no HX of the same. Pt is alert and oriented x4 at baseline. Vitals per EMS: 190/120, 50 HR, 16 RR, 145 CBG 97% on RA. Last Vitals: 140/88, 40 HR, 16 RR, 100RA.

## 2015-12-08 NOTE — Consult Note (Signed)
CARDIOLOGY CONSULT NOTE   Patient ID: [name] MRN: [id number] DOB/AGE: February 02, 1934 82 y.o.  Admit date: 12/08/2015  Primary Physician   Philis Fendt, MD Primary Cardiologist   Dr Angelena Form did a consult in 2013 Reason for Consultation   Atrial flutter, bradycardia  [id number] [name] is a 82 y.o. year old male with a history of HTN, HLD, DM, CKD III, CHF, afib/flutter, noncompliance  Seen in 2013 by cards for preop eval and troponin up to 1.20 in the setting of a fall w/ anemia, colon mass and injuries. EF 40-45%. Med rx recommended.   Pt came in by EMS today after a fall. He was hypertensive and bradycardic, in atrial flutter. Cardiology asked to evaluate.   [name] was in [place] w/ 1st deg AVB and non-conducted PACs 05/2015. In January 2017, he was in atrial flutter when he was in the ER for LE edema, hypoxia and venous stasis ulcers. Atrial fib/flutter is commented on by Dr Eulas Post when pt at [facility]. At that time, he was refusing Eliquis.  [name] has no awareness of the arrhythmia. He never feels his heart is out of rhythm, too fast or too slow. He denied presyncope or syncope. He has poor exercise tolerance. He can walk with a walker about 50 ft on flat ground. He feels in danger of falling if he does not use a walker. He denies feeling like he will pass out, says he gets off balance and his legs feel weak. He never gets chest pain. He denies SOB. He has problems with LE stasis ulcers and his legs are currently wrapped. He never gets chest pain.     Past Medical History  Diagnosis Date  . CHF (congestive heart failure) ([place])   . Diabetes mellitus without complication ([place])   . Hypertension   . Coronary artery disease   . Atrial fibrillation ([employer])   . Chronic kidney disease      Past Surgical History  Procedure Laterality Date  . Leg surgery      Left leg surgery. broken leg  . Esophagogastroduodenoscopy  06/19/2012    Procedure:  ESOPHAGOGASTRODUODENOSCOPY (EGD);  Surgeon: Beryle Beams, MD;  Location: [facility] ENDOSCOPY;  Service: Endoscopy;  Laterality: N/A;  . Colonoscopy  06/21/2012    Procedure: COLONOSCOPY;  Surgeon: Juanita Craver, MD;  Location: [facility] ENDOSCOPY;  Service: Endoscopy;  Laterality: N/A;  . Colonoscopy Left 02/13/2014    Procedure: COLONOSCOPY;  Surgeon: Juanita Craver, MD;  Location: [place];  Service: Endoscopy;  Laterality: Left;  . Laparoscopic right colectomy Right 02/15/2014    Procedure: LAPAROSCOPIC ASISSTED RIGHT  COLECTOMY, POSSIBLE OPEN;  Surgeon: Rolm Bookbinder, MD;  Location: [place];  Service: General;  Laterality: Right;    No Known Allergies  I have reviewed the patient's current medications . apixaban  2.5 mg Oral BID  . furosemide  40 mg Oral Daily  . insulin aspart  0-5 Units Subcutaneous QHS  . insulin aspart  0-9 Units Subcutaneous TID WC  . insulin glargine  5 Units Subcutaneous QHS  . isosorbide-hydrALAZINE  1 tablet Oral TID  . sodium chloride flush  3 mL Intravenous Q12H   . sodium chloride     acetaminophen **OR** acetaminophen, albuterol, bisacodyl, bisacodyl, hydrALAZINE, ondansetron **OR** ondansetron (ZOFRAN) IV, oxyCODONE-acetaminophen, polyethylene glycol, senna-docusate  Prior to Admission medications   Medication Sig Start Date End Date Taking? Authorizing Provider  albuterol (PROVENTIL) (2.5 MG/3ML) 0.083% nebulizer solution Take 3 mLs (2.5 mg total) by  nebulization every 4 (four) hours as needed for wheezing or shortness of breath. 09/02/15   Ripudeep Krystal Eaton, MD  amoxicillin-clavulanate (AUGMENTIN) 500-125 MG tablet Take 1 tablet (500 mg total) by mouth 2 (two) times daily with a meal. X 5days 09/02/15   Ripudeep Krystal Eaton, MD  apixaban (ELIQUIS) 2.5 MG TABS tablet Take 1 tablet (2.5 mg total) by mouth 2 (two) times daily. 09/02/15   Ripudeep Krystal Eaton, MD  bisacodyl (DULCOLAX) 10 MG suppository Place 1 suppository (10 mg total) rectally as needed for moderate constipation. 09/02/15    Ripudeep Krystal Eaton, MD  ferrous sulfate 325 (65 FE) MG tablet Take 1 tablet (325 mg total) by mouth 2 (two) times daily with a meal. Patient not taking: Reported on 08/28/2015 11/29/14   Nishant Dhungel, MD  furosemide (LASIX) 40 MG tablet Take 1 tablet (40 mg total) by mouth daily. 09/04/15   Ripudeep Krystal Eaton, MD  insulin aspart (NOVOLOG) 100 UNIT/ML injection Inject 0-15 Units into the skin 3 (three) times daily with meals. Sliding scale  CBG 70 - 120: 0 units: CBG 121 - 150: 2 units; CBG 151 - 200: 3 units; CBG 201 - 250: 5 units; CBG 251 - 300: 8 units;CBG 301 - 350: 11 units; CBG 351 - 400: 15 units; CBG > 400 : 15 units and notify MD Patient not taking: Reported on 12/04/2015 09/02/15   Ripudeep K Rai, MD  insulin glargine (LANTUS) 100 UNIT/ML injection Inject 0.05 mLs (5 Units total) into the skin at bedtime. Patient not taking: Reported on 12/04/2015 09/02/15   Ripudeep Krystal Eaton, MD  isosorbide-hydrALAZINE (BIDIL) 20-37.5 MG tablet Take 1 tablet by mouth 3 (three) times daily. 09/02/15   Ripudeep Krystal Eaton, MD  oxyCODONE-acetaminophen (PERCOCET/ROXICET) 5-325 MG tablet Take 1 tablet by mouth every 6 (six) hours as needed for moderate pain or severe pain. 09/02/15   Ripudeep Krystal Eaton, MD  polyethylene glycol [facility]) packet Take 17 g by mouth daily as needed. 09/02/15   Ripudeep Krystal Eaton, MD     Social History   Social History  . Marital Status: Widowed    Spouse Name: N/A  . Number of Children: N/A  . Years of Education: N/A   Occupational History  . Not on file.   Social History Main Topics  . Smoking status: Former Smoker    Quit date: 08/26/1998  . Smokeless tobacco: Never Used  . Alcohol Use: No     Comment: in the past  . Drug Use: No  . Sexual Activity: No   Other Topics Concern  . Not on file   Social History Narrative   Widower-   Has #2 children and #2 grandchildren and a great grandchild   Retired from [employer] (15 years ago)   Currently resides at [employer] and [place] with  walker-    Family Status  Relation Status Death Age  . Mother Deceased   . Father Deceased   . Sister Alive   . Paternal Uncle Deceased    Family History  Problem Relation Age of Onset  . Brain cancer Mother   . Diabetes type II Sister   . Narcolepsy Paternal Uncle      ROS:  Full 14 point review of systems complete and found to be negative unless listed above.  Physical Exam: Blood pressure 212/61, pulse 50, temperature 98.8 F (37.1 C), temperature source Oral, resp. rate 19, SpO2 98 %.  General: Well developed, well nourished, male in  no acute distress Head: Eyes PERRLA, No xanthomas.   Normocephalic and atraumatic, oropharynx without edema or exudate. Dentition: poor Lungs: decreased BS bases Heart:Heart irregular rate and rhythm with S1, S2, no murmur. pulses are 2+ both upper extrem. Not palpable in lower extrem due to wrappings (?Unna boots) Neck: No carotid bruits. No lymphadenopathy.  JVD not assessable due to body habitus. Abdomen: Bowel sounds present, abdomen soft and non-tender without masses or hernias noted. Msk:  No spine or cva tenderness. No weakness, no joint deformities or effusions. Extremities: No clubbing or cyanosis. No edema evident due to leg wrappings Neuro: Alert and oriented X 2. No focal deficits noted. Psych:  responds appropriately Skin: No rashes or lesions noted.  Labs:   Lab Results  Component Value Date   WBC 6.7 12/08/2015   HGB 9.8* 12/08/2015   HCT 32.1* 12/08/2015   MCV 80.5 12/08/2015   PLT 141* 12/08/2015    Recent Labs Lab 12/04/15 1431 12/08/15 1321  NA 136 141  K 4.4 4.8  CL 104 107  CO2 22 25  BUN 32* 24*  CREATININE 2.55* 2.33*  CALCIUM 8.8* 8.6*  PROT 7.3  --   BILITOT 0.8  --   ALKPHOS 50  --   ALT 8*  --   AST 10*  --   GLUCOSE 197* 148*  ALBUMIN 3.4*  --     Recent Labs  12/08/15 1335  TROPIPOC 0.03   B NATRIURETIC PEPTIDE  Date/Time Value Ref Range Status  12/08/2015 01:21 PM 236.2* 0.0 - 100.0  pg/mL Final  08/28/2015 12:07 PM 779.2* 0.0 - 100.0 pg/mL Final    Echo: 08/29/2015 - Left ventricle: The cavity size was normal. There was moderate  concentric hypertrophy. Systolic function was vigorous. The  estimated ejection fraction was in the range of 65% to 70%. Wall  motion was normal; there were no regional wall motion  abnormalities. The study was not technically sufficient to allow  evaluation of LV diastolic dysfunction due to atrial  fibrillation. - Aortic valve: Trileaflet; mildly thickened, mildly calcified  leaflets. There was no regurgitation. - Aortic root: The aortic root was normal in size. - Mitral valve: Structurally normal valve. There was no  regurgitation. - Left atrium: The atrium was moderately dilated. - Right ventricle: Systolic function was normal. - Right atrium: The atrium was normal in size. - Tricuspid valve: There was mild regurgitation. - Pulmonic valve: There was no regurgitation. - Pulmonary arteries: Systolic pressure was mildly increased. PA  peak pressure: 34 mm Hg (S). - Inferior vena cava: The vessel was normal in size. - Pericardium, extracardiac: There was no pericardial effusion.   ECG:  12/08/2015 Atrial flutter, slow VR  Radiology:  Ct Head Wo Contrast 12/08/2015  CLINICAL DATA:  Pain following fall EXAM: CT HEAD WITHOUT CONTRAST TECHNIQUE: Contiguous axial images were obtained from the base of the skull through the vertex without intravenous contrast. COMPARISON:  August 28, 2015 FINDINGS: There is age related volume loss. There is no intracranial mass, hemorrhage, extra-axial fluid collection, or midline shift. There is patchy small vessel disease in the centra semiovale bilaterally. There is a focal infarct in the lateral right thalamus near the junction with the posterior limb of the right internal capsule, not present previously and potentially representing a small recent infarct. Elsewhere gray-white compartments appear  normal. The bony calvarium appears intact. The mastoid air cells are clear. No intraorbital lesions are evident. IMPRESSION: Probable recent infarct lateral right thalamus with involvement of  a portion of the posterior limb of the right internal capsule. This focus of decreased attenuation which was not appreciable 3 months prior measures 7 x 7 mm. Elsewhere there is age related volume loss with patchy periventricular small vessel disease. No hemorrhage or mass effect. No extra-axial fluid collection. Electronically Signed   By: Lowella Grip III M.D.   On: 12/08/2015 14:33   Dg Chest Port 1 View 12/08/2015  CLINICAL DATA:  Patient fell from is recliner striking a carpeted surface without complaint of injury; patient found to be hypertensive and bradycardic in and in atrial fibrillation. History of CHF, diabetes, and chronic renal insufficiency. EXAM: PORTABLE CHEST 1 VIEW COMPARISON:  Chest x-ray of August 28, 2015 and CT scan of the chest of August 29, 2015. FINDINGS: The lungs are reasonably well inflated. The interstitial markings are mildly prominent but less conspicuous than in the past. The cardiac silhouette remains enlarged. The pulmonary vascularity is mildly prominent centrally but also less conspicuous than in January. No pleural effusion is observed. IMPRESSION: Cardiomegaly with mild central pulmonary vascularity without definite peripheral edema or pleural effusion. No pneumonia is evident. Electronically Signed   By: David  Martinique M.D.   On: 12/08/2015 13:45    ASSESSMENT AND PLAN:   The patient was seen today by Dr Debara Pickett, the patient evaluated and the data reviewed.  Principal Problem:   Bradycardia - asymptomatic, no rate-lowering meds - continue to follow  Otherwise, per IM Active Problems:   Chronic diastolic CHF (congestive heart failure) ([place])   Uncontrolled diabetes mellitus with diabetic nephropathy, with long-term current use of insulin (HCC)   Accelerated hypertension    Noncompliance with medications   Atrial flutter ([place])   Fall   Anemia   Chronic kidney disease   SignedLenoard Aden 12/08/2015 4:25 PM Beeper [id number]  Co-Sign MD

## 2015-12-08 NOTE — H&P (Signed)
[facility] History and Physical  Caitlyn Kleckner [id number] DOB: 08-16-1934 DOA: 12/08/2015  Referring physician: Lucio Edward PCP: Philis Fendt, MD   Chief Complaint: fall  HPI: [name] is a cranky 82 y.o. male with a past medical history that includes CHF, IDDM, hypertension, A. fib/flutter, CAD, chronic kidney disease, equal noncompliance presents to the emergency department from home via EMS with the chief complaint of fall. Initial evaluation in the emergency department bills bradycardia uncontrolled hypertension.  Information is obtained from the patient. He states he was recently "kicked out" of the nursing home and that he can't live with his daughter because she has "bedbugs". Patient remembers falling out of a recliner and hitting his head on the ground. He denies any loss of consciousness. EMS was called and reportedly found him to be hypertensive and bradycardic. Recent denies any chest pain palpitations headache dizziness visual disturbances syncope or near-syncope. He denies any recent illness fever chills abdominal pain nausea vomiting shortness of breath. He does indicate that he had not taken his morning medications for his blood pressure and that he rarely takes his Lasix. He denies shortness of breath lower extremity edema or orthopnea.   Review of Systems:  10 point review of systems complete and all systems are negative except as indicated in the history of present illness  Past Medical History  Diagnosis Date  . CHF (congestive heart failure) ([place])   . Diabetes mellitus without complication ([place])   . Hypertension   . Coronary artery disease   . Atrial fibrillation ([place])   . Chronic kidney disease    Past Surgical History  Procedure Laterality Date  . Leg surgery      Left leg surgery. broken leg  . Esophagogastroduodenoscopy  06/19/2012    Procedure: ESOPHAGOGASTRODUODENOSCOPY (EGD);  Surgeon: Beryle Beams, MD;  Location: [facility] ENDOSCOPY;   Service: Endoscopy;  Laterality: N/A;  . Colonoscopy  06/21/2012    Procedure: COLONOSCOPY;  Surgeon: Juanita Craver, MD;  Location: [facility] ENDOSCOPY;  Service: Endoscopy;  Laterality: N/A;  . Colonoscopy Left 02/13/2014    Procedure: COLONOSCOPY;  Surgeon: Juanita Craver, MD;  Location: [place];  Service: Endoscopy;  Laterality: Left;  . Laparoscopic right colectomy Right 02/15/2014    Procedure: LAPAROSCOPIC ASISSTED RIGHT  COLECTOMY, POSSIBLE OPEN;  Surgeon: Rolm Bookbinder, MD;  Location: [place];  Service: General;  Laterality: Right;   Social History:  reports that he quit smoking about 17 years ago. He has never used smokeless tobacco. He reports that he does not drink alcohol or use illicit drugs. Early staying with his daughter. Recently discharged from nursing home. No Known Allergies  Family History  Problem Relation Age of Onset  . Brain cancer Mother   . Diabetes type II Sister   . Narcolepsy Paternal Uncle      Prior to Admission medications   Medication Sig Start Date End Date Taking? Authorizing Provider  albuterol (PROVENTIL) (2.5 MG/3ML) 0.083% nebulizer solution Take 3 mLs (2.5 mg total) by nebulization every 4 (four) hours as needed for wheezing or shortness of breath. 09/02/15   Ripudeep Krystal Eaton, MD  amoxicillin-clavulanate (AUGMENTIN) 500-125 MG tablet Take 1 tablet (500 mg total) by mouth 2 (two) times daily with a meal. X 5days 09/02/15   Ripudeep Krystal Eaton, MD  apixaban (ELIQUIS) 2.5 MG TABS tablet Take 1 tablet (2.5 mg total) by mouth 2 (two) times daily. 09/02/15   Ripudeep Krystal Eaton, MD  bisacodyl (DULCOLAX) 10 MG suppository Place 1 suppository (10 mg  total) rectally as needed for moderate constipation. 09/02/15   Ripudeep Krystal Eaton, MD  ferrous sulfate 325 (65 FE) MG tablet Take 1 tablet (325 mg total) by mouth 2 (two) times daily with a meal. Patient not taking: Reported on 08/28/2015 11/29/14   Nishant Dhungel, MD  furosemide (LASIX) 40 MG tablet Take 1 tablet (40 mg total) by mouth daily.  09/04/15   Ripudeep Krystal Eaton, MD  insulin aspart (NOVOLOG) 100 UNIT/ML injection Inject 0-15 Units into the skin 3 (three) times daily with meals. Sliding scale  CBG 70 - 120: 0 units: CBG 121 - 150: 2 units; CBG 151 - 200: 3 units; CBG 201 - 250: 5 units; CBG 251 - 300: 8 units;CBG 301 - 350: 11 units; CBG 351 - 400: 15 units; CBG > 400 : 15 units and notify MD Patient not taking: Reported on 12/04/2015 09/02/15   Ripudeep K Rai, MD  insulin glargine (LANTUS) 100 UNIT/ML injection Inject 0.05 mLs (5 Units total) into the skin at bedtime. Patient not taking: Reported on 12/04/2015 09/02/15   Ripudeep Krystal Eaton, MD  isosorbide-hydrALAZINE (BIDIL) 20-37.5 MG tablet Take 1 tablet by mouth 3 (three) times daily. 09/02/15   Ripudeep Krystal Eaton, MD  oxyCODONE-acetaminophen (PERCOCET/ROXICET) 5-325 MG tablet Take 1 tablet by mouth every 6 (six) hours as needed for moderate pain or severe pain. 09/02/15   Ripudeep Krystal Eaton, MD  polyethylene glycol [facility]) packet Take 17 g by mouth daily as needed. 09/02/15   Ripudeep Krystal Eaton, MD   Physical Exam: Filed Vitals:   12/08/15 1400 12/08/15 1430 12/08/15 1500 12/08/15 1515  BP: 195/68 198/71 199/76 209/69  Pulse: 76  49   Temp:      TempSrc:      Resp: 17 10 13 17   SpO2: 94% 99% 100% 97%    Wt Readings from Last 3 Encounters:  09/02/15 113.3 kg (249 lb 12.5 oz)  08/05/15 112.9 kg (248 lb 14.4 oz)  05/18/15 108.863 kg (240 lb)    General:  Appears Somewhat irritable but not uncomfortable Eyes: PERRL, normal lids, irises & conjunctiva, slight abrasion periorbital area. No swelling ENT: grossly normal hearing, lips & tongue, membranes of his mouth are pink slightly dry Neck: no LAD, masses or thyromegaly Cardiovascular: Irregularly irregular, no m/r/g. Trace LE edema.  Respiratory: CTA bilaterally, no w/r/r. Normal respiratory effort. Abdomen: soft, ntnd is a bowel sounds throughout no guarding or rebounding Skin: no rash or induration seen on limited exam Musculoskeletal:  grossly normal tone BUE/BLE joints without swelling/erythema Psychiatric: grossly normal mood and affect, speech fluent and appropriate Neurologic: grossly non-focal. [place] slow but clear some stammering slight word searching bilateral grip 5 out of 5 follows commands           Labs on Admission:  Basic Metabolic Panel:  Recent Labs Lab 12/04/15 1431 12/08/15 1321  NA 136 141  K 4.4 4.8  CL 104 107  CO2 22 25  GLUCOSE 197* 148*  BUN 32* 24*  CREATININE 2.55* 2.33*  CALCIUM 8.8* 8.6*   Liver Function Tests:  Recent Labs Lab 12/04/15 1431  AST 10*  ALT 8*  ALKPHOS 50  BILITOT 0.8  PROT 7.3  ALBUMIN 3.4*    Recent Labs Lab 12/04/15 1431  LIPASE 30   No results for input(s): AMMONIA in the last 168 hours. CBC:  Recent Labs Lab 12/04/15 1431 12/08/15 1321  WBC 5.1 6.7  HGB 9.7* 9.8*  HCT 31.6* 32.1*  MCV 78.4 80.5  PLT 117* 141*   Cardiac Enzymes: No results for input(s): CKTOTAL, CKMB, CKMBINDEX, TROPONINI in the last 168 hours.  BNP (last 3 results)  Recent Labs  08/02/15 0510 08/28/15 1207 12/08/15 1321  BNP 191.9* 779.2* 236.2*    ProBNP (last 3 results) No results for input(s): PROBNP in the last 8760 hours.  CBG:  Recent Labs Lab 12/08/15 1351  GLUCAP 127*    Radiological Exams on Admission: Ct Head Wo Contrast  12/08/2015  CLINICAL DATA:  Pain following fall EXAM: CT HEAD WITHOUT CONTRAST TECHNIQUE: Contiguous axial images were obtained from the base of the skull through the vertex without intravenous contrast. COMPARISON:  August 28, 2015 FINDINGS: There is age related volume loss. There is no intracranial mass, hemorrhage, extra-axial fluid collection, or midline shift. There is patchy small vessel disease in the centra semiovale bilaterally. There is a focal infarct in the lateral right thalamus near the junction with the posterior limb of the right internal capsule, not present previously and potentially representing a small recent  infarct. Elsewhere gray-white compartments appear normal. The bony calvarium appears intact. The mastoid air cells are clear. No intraorbital lesions are evident. 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 This focus of decreased attenuation which was not appreciable 3 months prior measures 7 x 7 mm. Elsewhere there is age related volume loss with patchy periventricular small vessel disease. No hemorrhage or mass effect. No extra-axial fluid collection. Electronically Signed   By: Lowella Grip III M.D.   On: 12/08/2015 14:33   Dg Chest Port 1 View  12/08/2015  CLINICAL DATA:  Patient fell from is recliner striking a carpeted surface without complaint of injury; patient found to be hypertensive and bradycardic in and in atrial fibrillation. History of CHF, diabetes, and chronic renal insufficiency. EXAM: PORTABLE CHEST 1 VIEW COMPARISON:  Chest x-ray of August 28, 2015 and CT scan of the chest of August 29, 2015. FINDINGS: The lungs are reasonably well inflated. The interstitial markings are mildly prominent but less conspicuous than in the past. The cardiac silhouette remains enlarged. The pulmonary vascularity is mildly prominent centrally but also less conspicuous than in January. No pleural effusion is observed. IMPRESSION: Cardiomegaly with mild central pulmonary vascularity without definite peripheral edema or pleural effusion. No pneumonia is evident. Electronically Signed   By: David  Martinique M.D.   On: 12/08/2015 13:45    EKG: Independently reviewed.Atrial flutter Anteroseptal infarct, old Minimal ST depression, inferior leads  Assessment/Plan Principal Problem:   Bradycardia Active Problems:   Chronic diastolic CHF (congestive heart failure) (HCC)   Uncontrolled diabetes mellitus with diabetic nephropathy, with long-term current use of insulin (HCC)   Accelerated hypertension   Noncompliance  with medications   Atrial flutter (H[facility])   Fall   Anemia   Chronic kidney disease  #1. Bradycardia/a flutter. History of same. EKG as noted above. BNP 236. Chest x-ray reveals cardiomegaly with mild pulmonary vascularity without definite edema or pleural effusion no pneumonia. 3 months ago in the hospital with acute respiratory failure related to acute CHF. At that time echo yields EF of 65%. During that hospitalization bradycardia with a flutter noted. At that time placed on apixaban and beta blocker discontinued. Was also set up with an outpatient follow-up with cardiology which he did not go to. -Admit to telemetry -Cycle troponin -continue apixaban -Obtain a TSH -Request cardiology consult  #2. hypertension. Patient with history of difficult  to control blood pressure related to noncompliance. Home medications include Bidil and Lasix -We'll continue home medications -When necessary hydralazine -Monitor closely  #3. Fall. Patient denies loss of consciousness. Reportedly mechanical fall. CT of the head probable recent infarct lateral right thalamus. Report indicates this not present 3 months ago. No mass or hemorrhage. No indication of infection. No metabolic derangement. -We'll obtain an MRI of the brain -Will obtain carotid Dopplers -Lipid panel hemoglobin A1c -Cycle troponin -PT  4. chronic kidney disease stage III. creatinine 2.3 on admission. Chart review indicates this is close to baseline. -Hold nephrotoxins -Monitor urine output  5. Anemia. Likely related to chronic disease. Hemoglobin 9.8 on admission. Chart review indicates this is right at his baseline no signs symptoms of active bleeding -Monitor closely -FOBT -Continue iron supplement  6. Diabetes uncontrolled with diabetic neuropathy and long-term insulin use. Home medications include Lantus 5 units. Serum glucose 148 on admission -Obtain hemoglobin A1c -Continue home Lantus -Use sliding scale insulin for optimal  control   cardmaster  Code Status: full DVT Prophylaxis: Family Communication: none present Disposition Plan: may need placement  Time spent: 42 minutes  [facility]

## 2015-12-08 NOTE — ED Notes (Signed)
NP at the bedside for patient

## 2015-12-08 NOTE — Progress Notes (Signed)
Called with concern for patient with elevated BP and aflutter with rates 40-60.  Patient alert - warm and dry - mentation normal - denies pain or SOB - no headache - only complaint is hunger and feeling cold.  Review of chart reveals patient with non-compliance of home meds - patient states his BP is always around 200 - he does not worry about it.  MD's aware of vital signs - patient asymptomatic - per orders resuming home meds he is suppose to be on with desire to bring BP down without severe drop.  Aflutter now rates in 60's.  Cardiology on the case - notes noted.  Staff supported - patient appears at baseline - will follow on radar - staff to call as needed. Discussed with Harriette Bouillon.

## 2015-12-08 NOTE — ED Notes (Signed)
MD Ray at the bedside. 

## 2015-12-08 NOTE — Progress Notes (Signed)
Patient came in with two red marks on his forehead from fall at home. Legs, bilateral, have unna boots on them.Toes are dry, red and cracked.  An order has been placed for wound care and new unna boots to be placed.   The patient has maintained a SBP in the 200's. Hydralazine was given at 1630 and cannot be given again until 2030. The physician has been notified. He has been asymptomatic.  Rapid was called to monitor the patient.

## 2015-12-09 ENCOUNTER — Encounter (HOSPITAL_COMMUNITY): Payer: Self-pay

## 2015-12-09 DIAGNOSIS — N183 Chronic kidney disease, stage 3 (moderate): Secondary | ICD-10-CM | POA: Diagnosis present

## 2015-12-09 DIAGNOSIS — E785 Hyperlipidemia, unspecified: Secondary | ICD-10-CM | POA: Diagnosis present

## 2015-12-09 DIAGNOSIS — Z808 Family history of malignant neoplasm of other organs or systems: Secondary | ICD-10-CM | POA: Diagnosis not present

## 2015-12-09 DIAGNOSIS — Z9114 Patient's other noncompliance with medication regimen: Secondary | ICD-10-CM | POA: Diagnosis not present

## 2015-12-09 DIAGNOSIS — I6789 Other cerebrovascular disease: Secondary | ICD-10-CM | POA: Diagnosis not present

## 2015-12-09 DIAGNOSIS — E1122 Type 2 diabetes mellitus with diabetic chronic kidney disease: Secondary | ICD-10-CM | POA: Diagnosis present

## 2015-12-09 DIAGNOSIS — Z9119 Patient's noncompliance with other medical treatment and regimen: Secondary | ICD-10-CM | POA: Diagnosis not present

## 2015-12-09 DIAGNOSIS — I638 Other cerebral infarction: Secondary | ICD-10-CM | POA: Diagnosis present

## 2015-12-09 DIAGNOSIS — Z833 Family history of diabetes mellitus: Secondary | ICD-10-CM | POA: Diagnosis not present

## 2015-12-09 DIAGNOSIS — I672 Cerebral atherosclerosis: Secondary | ICD-10-CM | POA: Diagnosis present

## 2015-12-09 DIAGNOSIS — R001 Bradycardia, unspecified: Secondary | ICD-10-CM | POA: Diagnosis present

## 2015-12-09 DIAGNOSIS — D649 Anemia, unspecified: Secondary | ICD-10-CM

## 2015-12-09 DIAGNOSIS — I639 Cerebral infarction, unspecified: Secondary | ICD-10-CM | POA: Diagnosis present

## 2015-12-09 DIAGNOSIS — I130 Hypertensive heart and chronic kidney disease with heart failure and stage 1 through stage 4 chronic kidney disease, or unspecified chronic kidney disease: Secondary | ICD-10-CM | POA: Diagnosis present

## 2015-12-09 DIAGNOSIS — W07XXXA Fall from chair, initial encounter: Secondary | ICD-10-CM | POA: Diagnosis present

## 2015-12-09 DIAGNOSIS — I10 Essential (primary) hypertension: Secondary | ICD-10-CM | POA: Diagnosis not present

## 2015-12-09 DIAGNOSIS — S0081XA Abrasion of other part of head, initial encounter: Secondary | ICD-10-CM | POA: Diagnosis present

## 2015-12-09 DIAGNOSIS — Z794 Long term (current) use of insulin: Secondary | ICD-10-CM | POA: Diagnosis not present

## 2015-12-09 DIAGNOSIS — I2510 Atherosclerotic heart disease of native coronary artery without angina pectoris: Secondary | ICD-10-CM | POA: Diagnosis present

## 2015-12-09 DIAGNOSIS — E669 Obesity, unspecified: Secondary | ICD-10-CM | POA: Diagnosis present

## 2015-12-09 DIAGNOSIS — Z6832 Body mass index (BMI) 32.0-32.9, adult: Secondary | ICD-10-CM | POA: Diagnosis not present

## 2015-12-09 DIAGNOSIS — D638 Anemia in other chronic diseases classified elsewhere: Secondary | ICD-10-CM | POA: Diagnosis present

## 2015-12-09 DIAGNOSIS — I4891 Unspecified atrial fibrillation: Secondary | ICD-10-CM | POA: Diagnosis present

## 2015-12-09 DIAGNOSIS — E1121 Type 2 diabetes mellitus with diabetic nephropathy: Secondary | ICD-10-CM | POA: Diagnosis present

## 2015-12-09 DIAGNOSIS — Z87891 Personal history of nicotine dependence: Secondary | ICD-10-CM | POA: Diagnosis not present

## 2015-12-09 DIAGNOSIS — E1165 Type 2 diabetes mellitus with hyperglycemia: Secondary | ICD-10-CM | POA: Diagnosis present

## 2015-12-09 DIAGNOSIS — I4892 Unspecified atrial flutter: Secondary | ICD-10-CM

## 2015-12-09 DIAGNOSIS — I483 Typical atrial flutter: Secondary | ICD-10-CM | POA: Diagnosis not present

## 2015-12-09 DIAGNOSIS — I878 Other specified disorders of veins: Secondary | ICD-10-CM | POA: Diagnosis present

## 2015-12-09 DIAGNOSIS — I5032 Chronic diastolic (congestive) heart failure: Secondary | ICD-10-CM | POA: Diagnosis present

## 2015-12-09 LAB — BASIC METABOLIC PANEL
Anion gap: 7 (ref 5–15)
BUN: 25 mg/dL — AB (ref 6–20)
CHLORIDE: 107 mmol/L (ref 101–111)
CO2: 25 mmol/L (ref 22–32)
CREATININE: 2.61 mg/dL — AB (ref 0.61–1.24)
Calcium: 8.2 mg/dL — ABNORMAL LOW (ref 8.9–10.3)
GFR calc Af Amer: 25 mL/min — ABNORMAL LOW (ref >60)
GFR calc non Af Amer: 22 mL/min — ABNORMAL LOW (ref >60)
Glucose, Bld: 253 mg/dL — ABNORMAL HIGH (ref 65–99)
Potassium: 4.5 mmol/L (ref 3.5–5.1)
SODIUM: 139 mmol/L (ref 135–145)

## 2015-12-09 LAB — GLUCOSE, CAPILLARY
GLUCOSE-CAPILLARY: 141 mg/dL — AB (ref 65–99)
GLUCOSE-CAPILLARY: 186 mg/dL — AB (ref 65–99)
GLUCOSE-CAPILLARY: 146 mg/dL — AB (ref 65–99)
Glucose-Capillary: 167 mg/dL — ABNORMAL HIGH (ref 65–99)

## 2015-12-09 LAB — CBC
HCT: 29.2 % — ABNORMAL LOW (ref 39.0–52.0)
Hemoglobin: 8.7 g/dL — ABNORMAL LOW (ref 13.0–17.0)
MCH: 23.8 pg — AB (ref 26.0–34.0)
MCHC: 29.8 g/dL — ABNORMAL LOW (ref 30.0–36.0)
MCV: 80 fL (ref 78.0–100.0)
PLATELETS: 139 10*3/uL — AB (ref 150–400)
RBC: 3.65 MIL/uL — ABNORMAL LOW (ref 4.22–5.81)
RDW: 17 % — AB (ref 11.5–15.5)
WBC: 5.7 10*3/uL (ref 4.0–10.5)

## 2015-12-09 LAB — LIPID PANEL
CHOLESTEROL: 138 mg/dL (ref 0–200)
HDL: 25 mg/dL — ABNORMAL LOW (ref >40)
LDL Cholesterol: 96 mg/dL (ref 0–99)
Total CHOL/HDL Ratio: 5.5 RATIO
Triglycerides: 86 mg/dL (ref <150)
VLDL: 17 mg/dL (ref 0–40)

## 2015-12-09 LAB — HEMOGLOBIN A1C
Hgb A1c MFr Bld: 7.8 % — ABNORMAL HIGH (ref 4.8–5.6)
Mean Plasma Glucose: 177 mg/dL

## 2015-12-09 MED ORDER — AMLODIPINE BESYLATE 10 MG PO TABS
10.0000 mg | ORAL_TABLET | Freq: Every day | ORAL | Status: DC
  Administered 2015-12-09 – 2015-12-12 (×4): 10 mg via ORAL
  Filled 2015-12-09 (×4): qty 1

## 2015-12-09 MED ORDER — LORAZEPAM 2 MG/ML IJ SOLN
1.0000 mg | Freq: Once | INTRAMUSCULAR | Status: AC
  Administered 2015-12-10: 1 mg via INTRAVENOUS
  Filled 2015-12-09: qty 1

## 2015-12-09 MED ORDER — ATORVASTATIN CALCIUM 10 MG PO TABS
10.0000 mg | ORAL_TABLET | Freq: Every day | ORAL | Status: DC
Start: 2015-12-09 — End: 2015-12-10
  Administered 2015-12-09: 10 mg via ORAL
  Filled 2015-12-09: qty 1

## 2015-12-09 MED ORDER — LORAZEPAM 2 MG/ML IJ SOLN
1.0000 mg | Freq: Once | INTRAMUSCULAR | Status: DC
  Filled 2015-12-09: qty 1

## 2015-12-09 NOTE — Clinical Social Work Placement (Signed)
   CLINICAL SOCIAL WORK PLACEMENT  NOTE  Date:  12/09/2015  Patient Details  Name: [name] MRN: [id number] Date of Birth: [birth year]  Clinical Social Work is seeking post-discharge placement for this patient at the [place] level of care (*CSW will initial, date and re-position this form in  chart as items are completed):  Yes   Patient/family provided with [place] Work Department's list of facilities offering this level of care within the geographic area requested by the patient (or if unable, by the patient's family).  Yes   Patient/family informed of their freedom to choose among providers that offer the needed level of care, that participate in Medicare, Medicaid or managed care program needed by the patient, have an available bed and are willing to accept the patient.  Yes   Patient/family informed of [facility]'s ownership interest in [facility] and [facility], as well as of the fact that they are under no obligation to receive care at these facilities.  PASRR submitted to EDS on       PASRR number received on       Existing PASRR number confirmed on       FL2 transmitted to all facilities in geographic area requested by pt/family on 12/09/15     FL2 transmitted to all facilities within larger geographic area on       Patient informed that his/her managed care company has contracts with or will negotiate with certain facilities, including the following:            Patient/family informed of bed offers received.  Patient chooses bed at       Physician recommends and patient chooses bed at      Patient to be transferred to   on  .  Patient to be transferred to facility by       Patient family notified on   of transfer.  Name of family member notified:        PHYSICIAN Please prepare priority discharge summary, including medications, Please sign FL2, Please prepare prescriptions     Additional Comment:     _______________________________________________ Williemae Area, LCSW 12/09/2015, 4:46 PM Weekend Coverage

## 2015-12-09 NOTE — Progress Notes (Signed)
Subjective: Breathing is OK  NO CP   Objective: Filed Vitals:   12/08/15 1700 12/08/15 1833 12/08/15 2010 12/09/15 0637  BP: 225/81 215/69 185/64 171/63  Pulse: 81  63 58  Temp: 98.1 F (36.7 C)  98.1 F (36.7 C) 98.2 F (36.8 C)  TempSrc:   Oral Oral  Resp: 20  20 18   Weight:    230 lb 9.6 oz (104.6 kg)  SpO2: 100%  98% 98%   Weight change:   Intake/Output Summary (Last 24 hours) at 12/09/15 0906 Last data filed at 12/09/15 0239  Gross per 24 hour  Intake      0 ml  Output   1000 ml  Net  -1000 ml    General: Alert, awake, oriented x3, in no acute distress Neck:  JVP is normal Heart: Regular rate and rhythm, without murmurs, rubs, gallops.  Lungs: Clear to auscultation.  No rales or wheezes. Exemities:  No edema.   Neuro: Deferred    Tele:  Atrial flutter  40s to 60s    Lab Results: Results for orders placed or performed during the hospital encounter of 12/08/15 (from the past 24 hour(s))  Basic metabolic panel     Status: Abnormal   Collection Time: 12/08/15  1:21 PM  Result Value Ref Range   Sodium 141 135 - 145 mmol/L   Potassium 4.8 3.5 - 5.1 mmol/L   Chloride 107 101 - 111 mmol/L   CO2 25 22 - 32 mmol/L   Glucose, Bld 148 (H) 65 - 99 mg/dL   BUN 24 (H) 6 - 20 mg/dL   Creatinine, Ser 2.33 (H) 0.61 - 1.24 mg/dL   Calcium 8.6 (L) 8.9 - 10.3 mg/dL   GFR calc non Af Amer 25 (L) >60 mL/min   GFR calc Af Amer 29 (L) >60 mL/min   Anion gap 9 5 - 15  CBC     Status: Abnormal   Collection Time: 12/08/15  1:21 PM  Result Value Ref Range   WBC 6.7 4.0 - 10.5 K/uL   RBC 3.99 (L) 4.22 - 5.81 MIL/uL   Hemoglobin 9.8 (L) 13.0 - 17.0 g/dL   HCT 32.1 (L) 39.0 - 52.0 %   MCV 80.5 78.0 - 100.0 fL   MCH 24.6 (L) 26.0 - 34.0 pg   MCHC 30.5 30.0 - 36.0 g/dL   RDW 16.8 (H) 11.5 - 15.5 %   Platelets 141 (L) 150 - 400 K/uL  Brain natriuretic peptide     Status: Abnormal   Collection Time: 12/08/15  1:21 PM  Result Value Ref Range   B Natriuretic Peptide 236.2 (H) 0.0  - 100.0 pg/mL  I-stat troponin, ED     Status: None   Collection Time: 12/08/15  1:35 PM  Result Value Ref Range   Troponin i, poc 0.03 0.00 - 0.08 ng/mL   Comment 3          CBG monitoring, ED     Status: Abnormal   Collection Time: 12/08/15  1:51 PM  Result Value Ref Range   Glucose-Capillary 127 (H) 65 - 99 mg/dL  Glucose, capillary     Status: Abnormal   Collection Time: 12/08/15  5:08 PM  Result Value Ref Range   Glucose-Capillary 112 (H) 65 - 99 mg/dL  TSH     Status: None   Collection Time: 12/08/15  5:13 PM  Result Value Ref Range   TSH 1.265 0.350 - 4.500 uIU/mL  Hemoglobin A1c  Status: Abnormal   Collection Time: 12/08/15  5:13 PM  Result Value Ref Range   Hgb A1c MFr Bld 7.8 (H) 4.8 - 5.6 %   Mean Plasma Glucose 177 mg/dL  Lipid panel     Status: Abnormal   Collection Time: 12/08/15  5:13 PM  Result Value Ref Range   Cholesterol 157 0 - 200 mg/dL   Triglycerides 77 <150 mg/dL   HDL 33 (L) >40 mg/dL   Total CHOL/HDL Ratio 4.8 RATIO   VLDL 15 0 - 40 mg/dL   LDL Cholesterol 109 (H) 0 - 99 mg/dL  Troponin I (q 6hr x 3)     Status: Abnormal   Collection Time: 12/08/15  5:13 PM  Result Value Ref Range   Troponin I 0.04 (H) <0.031 ng/mL  Glucose, capillary     Status: Abnormal   Collection Time: 12/08/15  9:47 PM  Result Value Ref Range   Glucose-Capillary 121 (H) 65 - 99 mg/dL  Basic metabolic panel     Status: Abnormal   Collection Time: 12/09/15  5:07 AM  Result Value Ref Range   Sodium 139 135 - 145 mmol/L   Potassium 4.5 3.5 - 5.1 mmol/L   Chloride 107 101 - 111 mmol/L   CO2 25 22 - 32 mmol/L   Glucose, Bld 253 (H) 65 - 99 mg/dL   BUN 25 (H) 6 - 20 mg/dL   Creatinine, Ser 2.61 (H) 0.61 - 1.24 mg/dL   Calcium 8.2 (L) 8.9 - 10.3 mg/dL   GFR calc non Af Amer 22 (L) >60 mL/min   GFR calc Af Amer 25 (L) >60 mL/min   Anion gap 7 5 - 15  CBC     Status: Abnormal   Collection Time: 12/09/15  5:07 AM  Result Value Ref Range   WBC 5.7 4.0 - 10.5 K/uL   RBC  3.65 (L) 4.22 - 5.81 MIL/uL   Hemoglobin 8.7 (L) 13.0 - 17.0 g/dL   HCT 29.2 (L) 39.0 - 52.0 %   MCV 80.0 78.0 - 100.0 fL   MCH 23.8 (L) 26.0 - 34.0 pg   MCHC 29.8 (L) 30.0 - 36.0 g/dL   RDW 17.0 (H) 11.5 - 15.5 %   Platelets 139 (L) 150 - 400 K/uL  Glucose, capillary     Status: Abnormal   Collection Time: 12/09/15  6:38 AM  Result Value Ref Range   Glucose-Capillary 167 (H) 65 - 99 mg/dL  Lipid panel     Status: Abnormal   Collection Time: 12/09/15  7:17 AM  Result Value Ref Range   Cholesterol 138 0 - 200 mg/dL   Triglycerides 86 <150 mg/dL   HDL 25 (L) >40 mg/dL   Total CHOL/HDL Ratio 5.5 RATIO   VLDL 17 0 - 40 mg/dL   LDL Cholesterol 96 0 - 99 mg/dL    Studies/Results: Ct Head Wo Contrast  12/08/2015  CLINICAL DATA:  Pain following fall EXAM: CT HEAD WITHOUT CONTRAST TECHNIQUE: Contiguous axial images were obtained from the base of the skull through the vertex without intravenous contrast. COMPARISON:  August 28, 2015 FINDINGS: There is age related volume loss. There is no intracranial mass, hemorrhage, extra-axial fluid collection, or midline shift. There is patchy small vessel disease in the centra semiovale bilaterally. There is a focal infarct in the lateral right thalamus near the junction with the posterior limb of the right internal capsule, not present previously and potentially representing a small recent infarct. Elsewhere gray-white compartments appear normal.  The bony calvarium appears intact. The mastoid air cells are clear. No intraorbital lesions are evident. 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 This focus of decreased attenuation which was not appreciable 3 months prior measures 7 x 7 mm. Elsewhere there is age related volume loss with patchy periventricular small vessel disease. No hemorrhage or mass effect. No extra-axial fluid collection. Electronically Signed    By: Lowella Grip III M.D.   On: 12/08/2015 14:33   Mr Brain Wo Contrast  12/08/2015  CLINICAL DATA:  Patient fell out of recliner and hit head on ground. Stroke risk factors include hypertension, diabetes mellitus, atrial fibrillation; evaluation in ED revealed severe hypertension and bradycardia. EXAM: MRI HEAD WITHOUT CONTRAST TECHNIQUE: Multiplanar, multiecho pulse sequences of the brain and surrounding structures were obtained without intravenous contrast. COMPARISON:  CT head earlier today. FINDINGS: 1 Cm sized area of restricted diffusion affects the RIGHT lateral thalamus, possibly involving the medial aspect, posterior limb RIGHT internal capsule. This infarct is nonhemorrhagic. No other areas of acute infarction are seen. No features suggestive of hypertensive encephalopathy. No mass lesion or extra-axial fluid. Generalized atrophy. Hydrocephalus ex vacuo. Extensive white matter disease. Flow voids are maintained. Partial empty sella. Upper cervical region unremarkable. No tonsillar herniation. No posttraumatic sequelae are seen with regard to the recent fall. Negative orbits, sinuses, and mastoids. No significant scalp hematoma. IMPRESSION: Acute RIGHT thalamic infarct. No associated large vessel occlusion. No hemorrhagic transformation or posttraumatic sequelae. Atrophy and small vessel disease. No features suggestive of hypertensive encephalopathy. Electronically Signed   By: Staci Righter M.D.   On: 12/08/2015 21:16   Dg Chest Port 1 View  12/08/2015  CLINICAL DATA:  Patient fell from is recliner striking a carpeted surface without complaint of injury; patient found to be hypertensive and bradycardic in and in atrial fibrillation. History of CHF, diabetes, and chronic renal insufficiency. EXAM: PORTABLE CHEST 1 VIEW COMPARISON:  Chest x-ray of August 28, 2015 and CT scan of the chest of August 29, 2015. FINDINGS: The lungs are reasonably well inflated. The interstitial markings are mildly  prominent but less conspicuous than in the past. The cardiac silhouette remains enlarged. The pulmonary vascularity is mildly prominent centrally but also less conspicuous than in January. No pleural effusion is observed. IMPRESSION: Cardiomegaly with mild central pulmonary vascularity without definite peripheral edema or pleural effusion. No pneumonia is evident. Electronically Signed   By: David  Martinique M.D.   On: 12/08/2015 13:45    Medications: Reviewed   @PROBHOSP @    1  Atrial flutter  Rates controlled, slow attimes but OK  Off of anticoag for now  Have asked nuro to see pt   2  HTN  BP is high but better than yesterday  Follow for now.  Will not titrate too fast.    3  Chrnic diastolic CHF  Volume status is OK  4  Neuro  CVA as noted on MRI  Have asked neuro to see  Will call again today  5   Noncompliance    Dorris Carnes 12/09/2015, 9:06 AM

## 2015-12-09 NOTE — Evaluation (Signed)
Physical Therapy Evaluation Patient Details Name: [name] MRN: [id number] DOB: 08-31-1933 Today's Date: 12/09/2015   History of Present Illness  82 yo male with onset of fall from his recliner at home with daughter and findings of R lateral thalamic infarct, with PMHx:  a-fib, a-flutter, HTN, CHF, noncompliance  Clinical Impression  Pt is unable to focus on the concerns about his immediate balance and gait changes, directly concerned with PT asking for rehab stay as he states he cannot pay.  His understanding of his situation is that he has no home to return to and will need to see what the details of this are, and will otherwise continue to work on his strength and balance for home.    Follow Up Recommendations SNF    Equipment Recommendations  None recommended by PT    Recommendations for Other Services Rehab consult     Precautions / Restrictions Precautions Precautions: Fall (telemetry) Precaution Comments: Pt is agitated by PT arriving, poor comprehension  Restrictions Weight Bearing Restrictions: No      Mobility  Bed Mobility Overal bed mobility: Needs Assistance Bed Mobility: Supine to Sit;Sit to Supine     Supine to sit: Min assist;Mod assist Sit to supine: Min assist   General bed mobility comments: using elevated HOB and bedrail  Transfers Overall transfer level: Needs assistance Equipment used: Rolling walker (2 wheeled);1 person hand held assist Transfers: Sit to/from [facility] Sit to Stand: Min assist Stand pivot transfers: Min assist       General transfer comment: reminders for safety with maneuvering walker and controlling for safety  Ambulation/Gait Ambulation/Gait assistance: Min assist;Min guard Ambulation Distance (Feet): 35 Feet Assistive device: Rolling walker (2 wheeled);1 person hand held assist Gait Pattern/deviations: Step-to pattern;Step-through pattern;Wide base of support;Shuffle;Trunk flexed (coontrolled  for safety with cues) Gait velocity: reduced Gait velocity interpretation: Below normal speed for age/gender General Gait Details: Pt is getting up to walk wtih PT and cannot understand why he must have help.  States if he had his own clothing on he could walk alone  Financial trader [employer] (Stroke Patients Only)       Balance Overall balance assessment: Needs assistance Sitting-balance support: Feet supported Sitting balance-Leahy Scale: Good   Postural control: Posterior lean Standing balance support: Bilateral upper extremity supported Standing balance-Leahy Scale: Fair Standing balance comment: less than fair once moving in dynamic standing                             Pertinent Vitals/Pain Pain Assessment: No/denies pain    Home Living Family/patient expects to be discharged to:: Skilled nursing facility                 Additional Comments: Pt is not interested in SNF because he states it is too expensive    Prior Function Level of Independence: Needs assistance   Gait / Transfers Assistance Needed: RW with poor balance managment  ADL's / Homemaking Assistance Needed: not able to get this information from pt        Hand Dominance   Dominant Hand: Right    Extremity/Trunk Assessment   Upper Extremity Assessment: Overall WFL for tasks assessed           Lower Extremity Assessment: Generalized weakness      Cervical / Trunk Assessment: Normal  Communication   Communication:  No difficulties  Cognition Arousal/Alertness: Awake/alert Behavior During Therapy: Agitated Overall Cognitive Status: Impaired/Different from baseline Area of Impairment: Following commands;Safety/judgement;Awareness;Problem solving;Memory;Attention   Current Attention Level: Selective Memory: Decreased short-term memory Following Commands: Follows one step commands inconsistently Safety/Judgement: Decreased awareness of  safety;Decreased awareness of deficits Awareness: Intellectual Problem Solving: Slow processing;Requires verbal cues General Comments: Pt states he is fine and should be able to avoid SNF but agreed to inpt last time in hosp    General Comments General comments (skin integrity, edema, etc.): Pt is getting up to walk with help but is not safe alone.  He reports he has no home to return to and is not willing to talk about SNF care.  Not sure how he will be able to manage without assistance at home 24/7    Exercises        Assessment/Plan    PT Assessment Patient needs continued PT services  PT Diagnosis Difficulty walking;Generalized weakness   PT Problem List Decreased strength;Decreased range of motion;Decreased activity tolerance;Decreased balance;Decreased mobility;Decreased coordination;Decreased knowledge of use of DME;Decreased cognition;Cardiopulmonary status limiting activity;Obesity;Decreased skin integrity  PT Treatment Interventions DME instruction;Gait training;Functional mobility training;Therapeutic activities;Therapeutic exercise;Balance training;Neuromuscular re-education;Patient/family education   PT Goals (Current goals can be found in the Care Plan section) Acute Rehab PT Goals Patient Stated Goal: none stated PT Goal Formulation: Patient unable to participate in goal setting Time For Goal Achievement: 12/23/15 Potential to Achieve Goals: Good    Frequency Min 3X/week   Barriers to discharge Other (comment) (has no known environment for home)      Co-evaluation               End of Session   Activity Tolerance: Patient tolerated treatment well;Patient limited by fatigue Patient left: in bed;with call bell/phone within reach;Other (comment) (legs elevated on pillows due to coban wraps for edema) Nurse Communication: Mobility status         Time: [id number] PT Time Calculation (min) (ACUTE ONLY): 19 min   Charges:   PT Evaluation $PT Eval Moderate  Complexity: 1 Procedure     PT G CodesRamond [name] 2016-01-02, 10:46 AM   [name], PT MS Acute Rehab Dept. Number: [phone] [id number] and [place] [phone]

## 2015-12-09 NOTE — Progress Notes (Signed)
[facility]                                                                              Patient Demographics  [name], is a 82 y.o. male, DOB - 01/19/1934, [id number]  Admit date - 12/08/2015   Admitting Physician Elwin Mocha, MD  Outpatient Primary MD for the patient is Philis Fendt, MD  LOS -   days    Chief Complaint  Patient presents with  . Fall  . Bradycardia       Brief HPI   Patient is a 82 year old male with CHF, diabetes, hypertension, atrial fibrillation, CAD, CK 80, noncompliance presented from home via EMS with fall. Patient had reported an EGD that he was kicked out of the nursing home and he cannot live with his daughter. Patient had fallen out of the recliner hitting his head on the ground but otherwise denied any loss of consciousness. EMS found him to be hypotensive and bradycardiac and was brought to ED.   Assessment & Plan   Bradycardia/a flutter.With history of alcohol abuse, noncompliance with medical regimen, recent admission with the diastolic CHF and atrial fibrillation. Patient was placed on anticoagulation, Eliquis but was not taking it. -  Cardiology was consulted, recommended adding amlodipine however recommended avoiding AV blocking medications due to bradycardia. - Not pacemaker candidate - No anti-coagulation until neurology evaluation  Acute CVA - Delayed presentation hence not a TPA candidate.  - MRI brain showed acute right thalamic infarct, no associated large vessel occlusion, no hemorrhagic transformation, hypertensive encephalopathy - MRA, 2-D echo, carotid Dopplers pending - Neurology consulted, discussed with Dr. Leonel Ramsay - Lipid panel showed LDL 96 , goal less than 70, placed on statin - Hemoglobin A1cpending - PT evaluation recommending snf    hypertension. Patient with history of difficult to control blood pressure related to noncompliance. Home medications include Bidil and Lasix -  Continue Norvasc, BiDil and Lasix   Fall. - PT evaluation -> skilled nursing facility  chronic kidney disease stage III. creatinine 2.3 on admission. Chart review indicates this is close to baseline. -Hold nephrotoxins -Monitor urine output  Chronic anemia: Likely related to chronic disease. Hemoglobin 9.8 on admission, close to his baseline - Monitor CBC.   Diabetes uncontrolled with diabetic neuropathy and long-term insulin use.  - Obtain hemoglobin A1c, continue Lantus, sliding scale insulin  Code Status: Full code  Family Communication: Discussed in detail with the patient, all imaging results, lab results explained to the patient    Disposition Plan:   Time Spent in minutes  25 minutes  Procedures  CT head, MRI brain  Consults   Cardiology Neurology  DVT Prophylaxis SCDs  Medications  Scheduled Meds: . amLODipine  10 mg Oral Daily  . furosemide  40 mg Oral Daily  . insulin aspart  0-5 Units Subcutaneous QHS  . insulin aspart  0-9 Units Subcutaneous TID WC  . insulin glargine  5 Units Subcutaneous QHS  . isosorbide-hydrALAZINE  1 tablet Oral TID  . sodium chloride flush  3 mL Intravenous Q12H   Continuous Infusions:  PRN Meds:.acetaminophen **OR** acetaminophen, albuterol, bisacodyl, hydrALAZINE, ondansetron **OR**  ondansetron (ZOFRAN) IV, oxyCODONE-acetaminophen, polyethylene glycol, senna-docusate   Antibiotics   Anti-infectives    None        Subjective:   [name] was seen and examined today.  Feels weak otherwise denies any specific complaints. BP still elevated although improving. No nausea, vomiting, chest pain, shortness of breath. No acute events overnight.    Objective:   Filed Vitals:   12/08/15 1833 12/08/15 2010 12/09/15 0637 12/09/15 0925  BP: 215/69 185/64 171/63 155/60  Pulse:  63 58   Temp:  98.1 F (36.7 C) 98.2 F (36.8 C)   TempSrc:  Oral Oral   Resp:  20 18   Weight:   104.6 kg (230 lb 9.6 oz)   SpO2:  98% 98%      Intake/Output Summary (Last 24 hours) at 12/09/15 1203 Last data filed at 12/09/15 1121  Gross per 24 hour  Intake      3 ml  Output   1600 ml  Net  -1597 ml     Wt Readings from Last 3 Encounters:  12/09/15 104.6 kg (230 lb 9.6 oz)  09/02/15 113.3 kg (249 lb 12.5 oz)  08/05/15 112.9 kg (248 lb 14.4 oz)     Exam  General: Alert and oriented x 3, NAD  HEENT:  PERRLA, EOMI, Anicteric Sclera, mucous membranes moist.   Neck: Supple, no JVD, no masses  CVS: S1 S2 auscultated, no rubs, murmurs or gallops. Regular rate and rhythm.  Respiratory: Clear to auscultation bilaterally, no wheezing, rales or rhonchi  Abdomen: Soft, nontender, nondistended, + bowel sounds  Ext: no cyanosis clubbing or edema  Neuro: Moving all 4 extremities  Skin: No rashes  Psych: Normal affect and demeanor, alert and oriented x3    Data Reviewed:  I have personally reviewed following labs and imaging studies  Micro Results No results found for this or any previous visit (from the past 240 hour(s)).  Radiology Reports Ct Head Wo Contrast  12/08/2015  CLINICAL DATA:  Pain following fall EXAM: CT HEAD WITHOUT CONTRAST TECHNIQUE: Contiguous axial images were obtained from the base of the skull through the vertex without intravenous contrast. COMPARISON:  August 28, 2015 FINDINGS: There is age related volume loss. There is no intracranial mass, hemorrhage, extra-axial fluid collection, or midline shift. There is patchy small vessel disease in the centra semiovale bilaterally. There is a focal infarct in the lateral right thalamus near the junction with the posterior limb of the right internal capsule, not present previously and potentially representing a small recent infarct. Elsewhere gray-white compartments appear normal. The bony calvarium appears intact. The mastoid air cells are clear. No intraorbital lesions are evident. IMPRESSION: Probable recent infarct lateral right thalamus with involvement  of a portion of the posterior limb of the right internal capsule. This focus of decreased attenuation which was not appreciable 3 months prior measures 7 x 7 mm. Elsewhere there is age related volume loss with patchy periventricular small vessel disease. No hemorrhage or mass effect. No extra-axial fluid collection. Electronically Signed   By: Lowella Grip III M.D.   On: 12/08/2015 14:33   Mr Brain Wo Contrast  12/08/2015  CLINICAL DATA:  Patient fell out of recliner and hit head on ground. Stroke risk factors include hypertension, diabetes mellitus, atrial fibrillation; evaluation in ED revealed severe hypertension and bradycardia. EXAM: MRI HEAD WITHOUT CONTRAST TECHNIQUE: Multiplanar, multiecho pulse sequences of the brain and surrounding structures were obtained without intravenous contrast. COMPARISON:  CT head earlier today. FINDINGS:  1 Cm sized area of restricted diffusion affects the RIGHT lateral thalamus, possibly involving the medial aspect, posterior limb RIGHT internal capsule. This infarct is nonhemorrhagic. No other areas of acute infarction are seen. No features suggestive of hypertensive encephalopathy. No mass lesion or extra-axial fluid. Generalized atrophy. Hydrocephalus ex vacuo. Extensive white matter disease. Flow voids are maintained. Partial empty sella. Upper cervical region unremarkable. No tonsillar herniation. No posttraumatic sequelae are seen with regard to the recent fall. Negative orbits, sinuses, and mastoids. No significant scalp hematoma. IMPRESSION: Acute RIGHT thalamic infarct. No associated large vessel occlusion. No hemorrhagic transformation or posttraumatic sequelae. Atrophy and small vessel disease. No features suggestive of hypertensive encephalopathy. Electronically Signed   By: Staci Righter M.D.   On: 12/08/2015 21:16   Dg Chest Port 1 View  12/08/2015  CLINICAL DATA:  Patient fell from is recliner striking a carpeted surface without complaint of injury;  patient found to be hypertensive and bradycardic in and in atrial fibrillation. History of CHF, diabetes, and chronic renal insufficiency. EXAM: PORTABLE CHEST 1 VIEW COMPARISON:  Chest x-ray of August 28, 2015 and CT scan of the chest of August 29, 2015. FINDINGS: The lungs are reasonably well inflated. The interstitial markings are mildly prominent but less conspicuous than in the past. The cardiac silhouette remains enlarged. The pulmonary vascularity is mildly prominent centrally but also less conspicuous than in January. No pleural effusion is observed. IMPRESSION: Cardiomegaly with mild central pulmonary vascularity without definite peripheral edema or pleural effusion. No pneumonia is evident. Electronically Signed   By: David  Martinique M.D.   On: 12/08/2015 13:45    CBC  Recent Labs Lab 12/04/15 1431 12/08/15 1321 12/09/15 0507  WBC 5.1 6.7 5.7  HGB 9.7* 9.8* 8.7*  HCT 31.6* 32.1* 29.2*  PLT 117* 141* 139*  MCV 78.4 80.5 80.0  MCH 24.1* 24.6* 23.8*  MCHC 30.7 30.5 29.8*  RDW 16.0* 16.8* 17.0*    Chemistries   Recent Labs Lab 12/04/15 1431 12/08/15 1321 12/09/15 0507  NA 136 141 139  K 4.4 4.8 4.5  CL 104 107 107  CO2 22 25 25   GLUCOSE 197* 148* 253*  BUN 32* 24* 25*  CREATININE 2.55* 2.33* 2.61*  CALCIUM 8.8* 8.6* 8.2*  AST 10*  --   --   ALT 8*  --   --   ALKPHOS 50  --   --   BILITOT 0.8  --   --    ------------------------------------------------------------------------------------------------------------------ estimated creatinine clearance is 28 mL/min (by C-G formula based on Cr of 2.61). ------------------------------------------------------------------------------------------------------------------  Recent Labs  12/08/15 1713  HGBA1C 7.8*   ------------------------------------------------------------------------------------------------------------------  Recent Labs  12/08/15 1713 12/09/15 0717  CHOL 157 138  HDL 33* 25*  LDLCALC 109* 96  TRIG  77 86  CHOLHDL 4.8 5.5   ------------------------------------------------------------------------------------------------------------------  Recent Labs  12/08/15 1713  TSH 1.265   ------------------------------------------------------------------------------------------------------------------ No results for input(s): VITAMINB12, FOLATE, FERRITIN, TIBC, IRON, RETICCTPCT in the last 72 hours.  Coagulation profile No results for input(s): INR, PROTIME in the last 168 hours.  No results for input(s): DDIMER in the last 72 hours.  Cardiac Enzymes  Recent Labs Lab 12/08/15 1713  TROPONINI 0.04*   ------------------------------------------------------------------------------------------------------------------ Invalid input(s): POCBNP   Recent Labs  12/08/15 1351 12/08/15 1708 12/08/15 2147 12/09/15 0638  GLUCAP 127* 112* 121* 167*     Tacoma Merida M.D. [facility] 12/09/2015, 12:03 PM  Pager: [phone] Between 7am to 7pm - call Pager - [phone]  After 7pm go to  [email or web address] - password [email or web address]  Call night coverage person covering after 7pm

## 2015-12-09 NOTE — NC FL2 (Signed)
[place] LEVEL OF CARE SCREENING TOOL     IDENTIFICATION  Patient Name: [name] Birthdate: October 30, 1933 Sex: male Admission Date (Current Location): 12/08/2015  [facility] and [place] Number:  Herbalist and Address:  [facility], [place] [address] [place], [place] [address]      Provider Number: [id number]  Attending Physician Name and Address:  Mendel Corning, MD  Relative Name and Phone Number:       Current Level of Care: Hospital Recommended Level of Care: [place] Prior Approval Number:    Date Approved/Denied:   PASRR Number:    Discharge Plan: SNF    Current Diagnoses: Patient Active Problem List   Diagnosis Date Noted  . Acute CVA (cerebrovascular accident) ([place]) 12/09/2015  . Bradycardia 12/08/2015  . Fall 12/08/2015  . Anemia 12/08/2015  . Chronic kidney disease   . Atrial flutter ([place]) 08/29/2015  . Acute respiratory failure ([place]) 08/29/2015  . Acute on chronic diastolic heart failure ([place]) 08/28/2015  . Hypoxia 08/28/2015  . Atrial fibrillation ([place])   . Diabetes mellitus without complication ([place])   . Diabetes mellitus with complication ([place])   . Noncompliance with medications 08/03/2015  . Chronic diastolic CHF (congestive heart failure) ([place]) 07/31/2015  . Cellulitis of left lower extremity 07/31/2015  . Anemia of chronic renal failure, stage 3 (moderate) 07/31/2015  . Uncontrolled diabetes mellitus with diabetic nephropathy, with long-term current use of insulin ([place]) 07/31/2015  . Accelerated hypertension 07/31/2015  . Thrombocytopenia ([place]) 07/31/2015  . Acute renal failure superimposed on stage 3 chronic kidney disease ([place]) 11/29/2014  . Chronic acquired lymphedema 06/25/2011    Orientation RESPIRATION BLADDER Height & Weight     Time, Self, Situation, Place  Normal Continent Weight: 230 lb 9.6 oz (104.6 kg) Height:     BEHAVIORAL SYMPTOMS/MOOD NEUROLOGICAL BOWEL  NUTRITION STATUS      Continent Diet (Heart Healthy/Carb modified)  AMBULATORY STATUS COMMUNICATION OF NEEDS Skin     Verbally Other (Comment) (Cracked skin on feet and toes, bilateral leg dressings- co-ban and ointment to skin-no skin breakdown )                       Personal Care Assistance Level of Assistance  Dressing Bathing Assistance: Limited assistance   Dressing Assistance: Limited assistance     Functional Limitations Info             SPECIAL CARE FACTORS FREQUENCY  OT (By licensed OT)     PT Frequency: 5 OT Frequency: 5            Contractures Contractures Info: Not present    Additional Factors Info  Code Status, Allergies Code Status Info: Full Allergies Info: NKA           Current Medications (12/09/2015):  This is the current hospital active medication list Current Facility-Administered Medications  Medication Dose Route Frequency Provider Last Rate Last Dose  . acetaminophen (TYLENOL) tablet 650 mg  650 mg Oral Q6H PRN Radene Gunning, NP       Or  . acetaminophen (TYLENOL) suppository 650 mg  650 mg Rectal Q6H PRN Lezlie Octave Black, NP      . albuterol (PROVENTIL) (2.5 MG/3ML) 0.083% nebulizer solution 2.5 mg  2.5 mg Nebulization Q4H PRN Lezlie Octave Black, NP      . amLODipine (NORVASC) tablet 10 mg  10 mg Oral Daily Ripudeep Krystal Eaton, MD   10  mg at 12/09/15 0925  . atorvastatin (LIPITOR) tablet 10 mg  10 mg Oral q1800 Ripudeep K Rai, MD      . bisacodyl (DULCOLAX) suppository 10 mg  10 mg Rectal Daily PRN Radene Gunning, NP      . furosemide (LASIX) tablet 40 mg  40 mg Oral Daily Radene Gunning, NP   40 mg at 12/09/15 0925  . hydrALAZINE (APRESOLINE) injection 5 mg  5 mg Intravenous Q4H PRN Radene Gunning, NP   5 mg at 12/08/15 1633  . insulin aspart (novoLOG) injection 0-5 Units  0-5 Units Subcutaneous QHS Radene Gunning, NP   0 Units at 12/08/15 2217  . insulin aspart (novoLOG) injection 0-9 Units  0-9 Units Subcutaneous TID WC Radene Gunning, NP   1 Units  at 12/09/15 1231  . insulin glargine (LANTUS) injection 5 Units  5 Units Subcutaneous QHS Radene Gunning, NP   5 Units at 12/08/15 2233  . isosorbide-hydrALAZINE (BIDIL) 20-37.5 MG per tablet 1 tablet  1 tablet Oral TID Radene Gunning, NP   1 tablet at 12/09/15 0925  . LORazepam (ATIVAN) injection 1 mg  1 mg Intravenous Once Ripudeep K Rai, MD      . LORazepam (ATIVAN) injection 1 mg  1 mg Intravenous Once Ripudeep K Rai, MD      . ondansetron (ZOFRAN) tablet 4 mg  4 mg Oral Q6H PRN Radene Gunning, NP       Or  . ondansetron [facility]) injection 4 mg  4 mg Intravenous Q6H PRN Radene Gunning, NP      . oxyCODONE-acetaminophen (PERCOCET/ROXICET) 5-325 MG per tablet 1 tablet  1 tablet Oral Q6H PRN Radene Gunning, NP      . polyethylene glycol (MIRALAX / GLYCOLAX) packet 17 g  17 g Oral Daily PRN Radene Gunning, NP      . senna-docusate (Senokot-S) tablet 1 tablet  1 tablet Oral QHS PRN Lezlie Octave Black, NP      . sodium chloride flush (NS) 0.9 % injection 3 mL  3 mL Intravenous Q12H Radene Gunning, NP   3 mL at 12/09/15 [id number]     Discharge Medications: Please see discharge summary for a list of discharge medications.  Relevant Imaging Results:  Relevant Lab Results:   Additional Information SSN:  [id number], [place], [place]

## 2015-12-09 NOTE — Consult Note (Signed)
Neurology Consultation Reason for Consult: Stroke Referring Physician: Rai, R  CC: Stroke  History is obtained from:Patient  HPI: [name] is a 82 y.o. male who presented after fall. He does not feel that any focal findings. A head CT was obtained and demonstrated a age indeterminant thalamic infarct. An MRI was therefore performed which shows that this infarct is indeed acute. It is unclear if it happened at the time of the fall or indeed contributed to the fall, but this is certainly possible. He does not notice any symptoms or notice any acute change.   LKW: unclear tpa given?: no, unclear time of onset.     ROS: A 14 point ROS was performed and is negative except as noted in the HPI.   Past Medical History  Diagnosis Date  . CHF (congestive heart failure) ([place])   . Diabetes mellitus without complication ([place])   . Hypertension   . Coronary artery disease   . Atrial fibrillation ([place])   . Chronic kidney disease      Family History  Problem Relation Age of Onset  . Brain cancer Mother   . Diabetes type II Sister   . Narcolepsy Paternal Uncle      Social History:  reports that he quit smoking about 17 years ago. He has never used smokeless tobacco. He reports that he does not drink alcohol or use illicit drugs.   Exam: Current vital signs: BP 155/60 mmHg  Pulse 58  Temp(Src) 98.2 F (36.8 C) (Oral)  Resp 18  Wt 104.6 kg (230 lb 9.6 oz)  SpO2 98% Vital signs in last 24 hours: Temp:  [98 F (36.7 C)-98.8 F (37.1 C)] 98.2 F (36.8 C) (04/15 [id number]) Pulse Rate:  [32-81] 58 (04/15 0637) Resp:  [10-20] 18 (04/15 0637) BP: (155-225)/(60-95) 155/60 mmHg (04/15 0925) SpO2:  [94 %-100 %] 98 % (04/15 0637) Weight:  [104.6 kg (230 lb 9.6 oz)] 104.6 kg (230 lb 9.6 oz) (04/15 [id number])   Physical Exam  Constitutional: Appears well-developed and well-nourished.  Psych: Affect appropriate to situation Eyes: No scleral injection HENT: No OP obstrucion Head:  Normocephalic.  Cardiovascular: Normal rate and regular rhythm.  Respiratory: Effort normal and breath sounds normal to anterior ascultation GI: Soft.  No distension. There is no tenderness.  Skin: WDI  Neuro: Mental Status: Patient is awake, alert, oriented to person, place, month, year, and situation. Patient is able to give a clear and coherent history. No signs of aphasia or neglect Cranial Nerves: II: Visual Fields are full. Pupils are equal, round, and reactive to light.   III,IV, VI: EOMI without ptosis or diploplia.  V: Facial sensation is symmetric to temperature VII: Facial movement is symmetric.  VIII: hearing is intact to voice X: Uvula elevates symmetrically XI: Shoulder shrug is symmetric. XII: tongue is midline without atrophy or fasciculations.  Motor: Tone is normal. Bulk is normal. 5/5 strength was present in all four extremities.  Sensory: Sensation is symmetric to light touch and temperature in the arms and legs. Cerebellar: FNF and HKS are intact bilaterally   I have reviewed labs in epic and the results pertinent to this consultation are:  Elevated glucose, elevate creatinine  I have reviewed the images obtained: MRI brain-thalamic infarct   Impression: 82 year old male with thalamic infarct. It is possible that this is related to his atrial flutter. It is also possible that this is related to small vessel disease. I would favor holding anticoagulation for at least a week, though it  is not large. He will also need small vessel risk factors assessed.  Recommendations: 1. Agree with starting statin 2. MRA  of the brain without contrast 3. Frequent neuro checks 4. Echocardiogram 5. Carotid dopplers 6. Prophylactic therapy-Antiplatelet med: Aspirin - dose 325mg  PO or 300mg  PR 7. Risk factor modification 8. Telemetry monitoring 9. PT consult, OT consult, Speech consult 10. please page stroke NP  Or  PA  Or MD  M-F from 8am -4 pm starting 4/16 as this  patient will be followed by the stroke team at this point.   You can look them up on [email or web address]      Roland Rack, MD [facility] [phone]  If 7pm- 7am, please page neurology on call as listed in [place].

## 2015-12-09 NOTE — Consult Note (Signed)
WOC wound consult note Reason for Consult: Unna's boots Patient admitted with Unna's boots, patient reports he does not currently have any open wounds.  Wound type: none, venous stasis skin changes  Dressing procedure/placement/frequency: Orthopedic tech in the room at the time of my assessment to apply bilateral Unna's boots.  Both legs are dry, clean with no open ulcers.  He does have some hyperkeratosis and skin changes on the LLE around the malleolar region consistent with venous stasis.  I have apply some emollient/moisturizer to this area today prior to the placement of the new Unna's boots.  Will need changed bilaterally 2x wk (Sat/Weds).  Discussed POC with patient and bedside nurse.  Re consult if needed, will not follow at this time. Thanks  Loma [place] [employer], [place] [phone])

## 2015-12-09 NOTE — Clinical Social Work Note (Signed)
Clinical Social Work Assessment  Patient Details  Name: [name] MRN: [id number] Date of Birth: Nov 24, 1933  Date of referral:  12/09/15               Reason for consult:  Facility Placement                Permission sought to share information with:  Family Supports Permission granted to share information::  Yes, Verbal Permission Granted  Name::     Daughter and Blount::     Relationship::     Contact Information:     Housing/Transportation Living arrangements for the past 2 months:  [place] ([place] until several weeks ago) Source of Information:  Patient Patient Interpreter Needed:  None Criminal Activity/Legal Involvement Pertinent to Current Situation/Hospitalization:   None Significant Relationships:  Adult Children (Pt has a very difficult relationship with his daughter) Lives with:  Adult Children Do you feel safe going back to the place where you live?  No (States Daughter's apt  has "bed bugs") Need for family participation in patient care:  No (Coment)  Care giving concerns:  Recently DC'd from [facility] and returned home with his daughter and her boyfriend in an apartment. Pt states daughter's apartment has bed bugs. He has sent all of his clothes to cleaners/and to wash and fold laundry and does not wish to take them back into his daughters's apartment.   Social Worker assessment / plan:  This gentleman is well known to this CSW from past hospitalizations.  He has been in and out of the hospital and normally lived with his daughter whom he has a very unstable relationship.  Patient states that his daughter moved her boyfriend into the apartment and expects his income to pay their bills. He states that the boyfriend is married and does not work.  Patient was recently in assisted living but states that he was "kicked out" because he would not given them more money.  He states that he did not feel that he should have to  give both his social security check and pension check to the facility and they wanted to be paid before he received his monthly check.  (CSW is unable to check on this as the ALF's office is closed today).  Patient always has same request of CSW- he wants the hospital to locate an apartment for him to live in. He is unable to state how he would get furniture, towels, dishes etc nor can he walk and perform his ADL's independently.  He has a truck that he is making payments on and states that he cannot give over his income to a facility due to his bills.  Patient is very proud of his income and feels that he "earned it and therefore should not have to give it to anyone."  Despite talking about challenges with independent living- patient remains adamant that he wants his own apartment. CSW reviewed the role of the hospital SW again and provided support; however, patient remains fixed on idea of independent living. After extensive discussion- patient finally agreed to short term SNF for rehab and treatment of skin issues.  He states he would like to go to [facility] as he has "looked into this in the past" but never spoke to them about staying there.  Discussed Medicare coverage and bed search process and he agreed to this. Patient states he has been a resident at [facility] in the  past and that his wife was a patient there in the past as well. She is now deceased but he was very proud that he was able to care for her at home. Fl2 will be placed on chart for MD's signature.  Employment status:  Retired Forensic scientist:  Medicare PT Recommendations:  [place] / Referral to community resources:  [place]  Patient/Family's Response to care: Patient states the he is feeling better and denies any concerns about his current care except that he feels that the hospital staff should "find him an apartment".    Patient/Family's Understanding of and Emotional  Response to Diagnosis, Current Treatment, and Prognosis: Patient is alert, oriented and very independent minded.  He is able to verbalize a fairly good understanding of his physical conditions but there is a disconnect in his understanding of his medical issues vs being able to be independent.  He wants so badly to be on his own.  Patient becomes easily frustrated and sounds angry at times as he does not feel his daughter has been supportive of his needs either.   Emotional Assessment Appearance:  Appears stated age, Disheveled Attitude/Demeanor/Rapport:  Angry, Complaining, Guarded, Reactive Affect (typically observed):  Angry, In denial, Irritable, Blunt Orientation:  Oriented to Self, Oriented to Place, Oriented to  Time, Oriented to Situation Alcohol / Substance use:  Never Used Psych involvement (Current and /or in the community):  No (Comment)  Discharge Needs  Concerns to be addressed:  Care Coordination, Discharge Planning Concerns Readmission within the last 30 days:  No Current discharge risk:  Dependent with Mobility, Physical Impairment, Lack of support system Barriers to Discharge:  Continued Medical Work up, Unsafe home situation   Estill Bakes 12/09/2015, 4:25 PM Weekend Coverage

## 2015-12-10 ENCOUNTER — Inpatient Hospital Stay (HOSPITAL_COMMUNITY): Payer: Medicare Other

## 2015-12-10 DIAGNOSIS — I639 Cerebral infarction, unspecified: Secondary | ICD-10-CM

## 2015-12-10 LAB — CBC
HEMATOCRIT: 29.4 % — AB (ref 39.0–52.0)
HEMOGLOBIN: 9 g/dL — AB (ref 13.0–17.0)
MCH: 24.5 pg — AB (ref 26.0–34.0)
MCHC: 30.6 g/dL (ref 30.0–36.0)
MCV: 80.1 fL (ref 78.0–100.0)
Platelets: 131 10*3/uL — ABNORMAL LOW (ref 150–400)
RBC: 3.67 MIL/uL — AB (ref 4.22–5.81)
RDW: 17 % — ABNORMAL HIGH (ref 11.5–15.5)
WBC: 6.9 10*3/uL (ref 4.0–10.5)

## 2015-12-10 LAB — GLUCOSE, CAPILLARY
Glucose-Capillary: 144 mg/dL — ABNORMAL HIGH (ref 65–99)
Glucose-Capillary: 202 mg/dL — ABNORMAL HIGH (ref 65–99)
Glucose-Capillary: 154 mg/dL — ABNORMAL HIGH (ref 65–99)
Glucose-Capillary: 151 mg/dL — ABNORMAL HIGH (ref 65–99)

## 2015-12-10 LAB — BASIC METABOLIC PANEL
Anion gap: 8 (ref 5–15)
BUN: 29 mg/dL — AB (ref 6–20)
CHLORIDE: 105 mmol/L (ref 101–111)
CO2: 24 mmol/L (ref 22–32)
CREATININE: 2.81 mg/dL — AB (ref 0.61–1.24)
Calcium: 8.4 mg/dL — ABNORMAL LOW (ref 8.9–10.3)
GFR calc Af Amer: 23 mL/min — ABNORMAL LOW (ref >60)
GFR calc non Af Amer: 20 mL/min — ABNORMAL LOW (ref >60)
Glucose, Bld: 169 mg/dL — ABNORMAL HIGH (ref 65–99)
POTASSIUM: 4.7 mmol/L (ref 3.5–5.1)
Sodium: 137 mmol/L (ref 135–145)

## 2015-12-10 MED ORDER — ISOSORBIDE MONONITRATE ER 60 MG PO TB24
60.0000 mg | ORAL_TABLET | Freq: Every day | ORAL | Status: DC
  Administered 2015-12-10 – 2015-12-12 (×3): 60 mg via ORAL
  Filled 2015-12-10 (×3): qty 1

## 2015-12-10 MED ORDER — HYDRALAZINE HCL 50 MG PO TABS
50.0000 mg | ORAL_TABLET | Freq: Three times a day (TID) | ORAL | Status: DC
Start: 2015-12-10 — End: 2015-12-12
  Administered 2015-12-10 – 2015-12-12 (×8): 50 mg via ORAL
  Filled 2015-12-10 (×8): qty 1

## 2015-12-10 MED ORDER — ASPIRIN EC 325 MG PO TBEC
325.0000 mg | DELAYED_RELEASE_TABLET | Freq: Every day | ORAL | Status: DC
  Administered 2015-12-10 – 2015-12-11 (×2): 325 mg via ORAL
  Filled 2015-12-10 (×2): qty 1

## 2015-12-10 MED ORDER — ATORVASTATIN CALCIUM 20 MG PO TABS
20.0000 mg | ORAL_TABLET | Freq: Every day | ORAL | Status: DC
Start: 2015-12-10 — End: 2015-12-12
  Administered 2015-12-10 – 2015-12-11 (×2): 20 mg via ORAL
  Filled 2015-12-10 (×2): qty 1

## 2015-12-10 NOTE — Progress Notes (Signed)
VASCULAR LAB PRELIMINARY  PRELIMINARY  PRELIMINARY  PRELIMINARY  Carotid duplex completed.    Preliminary report:  Bilateral:  1-39% ICA stenosis.  Vertebral artery flow is antegrade.      Lovely Kerins, RVT, RDMS 12/10/2015, 1:16 PM

## 2015-12-10 NOTE — Progress Notes (Signed)
Subjective: No SOB   NO CP   Objective: Filed Vitals:   12/09/15 0925 12/09/15 1415 12/09/15 2045 12/10/15 0419  BP: 155/60 168/53 151/50 174/85  Pulse:  52 49 61  Temp:  98.3 F (36.8 C) 98.2 F (36.8 C) 98.6 F (37 C)  TempSrc:  Oral Oral Oral  Resp:  23 20 17   Weight:    232 lb 5.8 oz (105.4 kg)  SpO2:  98% 99% 96%   Weight change: 1 lb 12.2 oz (0.8 kg)  Intake/Output Summary (Last 24 hours) at 12/10/15 0617 Last data filed at 12/10/15 0422  Gross per 24 hour  Intake      3 ml  Output   1150 ml  Net  -1147 ml    General: Alert, awake, oriented x3, in no acute distress Neck:  JVP is normal Heart: Regular rate and rhythm, without murmurs, rubs, gallops.  Lungs: Clear to auscultation.  No rales or wheezes. Exemities:  No edema.   Neuro: Deferred    Tele; Atrial flutter  Lab Results: Results for orders placed or performed during the hospital encounter of 12/08/15 (from the past 24 hour(s))  Glucose, capillary     Status: Abnormal   Collection Time: 12/09/15  6:38 AM  Result Value Ref Range   Glucose-Capillary 167 (H) 65 - 99 mg/dL  Lipid panel     Status: Abnormal   Collection Time: 12/09/15  7:17 AM  Result Value Ref Range   Cholesterol 138 0 - 200 mg/dL   Triglycerides 86 <150 mg/dL   HDL 25 (L) >40 mg/dL   Total CHOL/HDL Ratio 5.5 RATIO   VLDL 17 0 - 40 mg/dL   LDL Cholesterol 96 0 - 99 mg/dL  Glucose, capillary     Status: Abnormal   Collection Time: 12/09/15 11:07 AM  Result Value Ref Range   Glucose-Capillary 141 (H) 65 - 99 mg/dL   Comment 1 Notify RN   Glucose, capillary     Status: Abnormal   Collection Time: 12/09/15  4:03 PM  Result Value Ref Range   Glucose-Capillary 186 (H) 65 - 99 mg/dL  Glucose, capillary     Status: Abnormal   Collection Time: 12/09/15 10:09 PM  Result Value Ref Range   Glucose-Capillary 146 (H) 65 - 99 mg/dL   Comment 1 Notify RN    Comment 2 Document in Chart   CBC     Status: Abnormal   Collection Time: 12/10/15   3:54 AM  Result Value Ref Range   WBC 6.9 4.0 - 10.5 K/uL   RBC 3.67 (L) 4.22 - 5.81 MIL/uL   Hemoglobin 9.0 (L) 13.0 - 17.0 g/dL   HCT 29.4 (L) 39.0 - 52.0 %   MCV 80.1 78.0 - 100.0 fL   MCH 24.5 (L) 26.0 - 34.0 pg   MCHC 30.6 30.0 - 36.0 g/dL   RDW 17.0 (H) 11.5 - 15.5 %   Platelets 131 (L) 150 - 400 K/uL  Basic metabolic panel     Status: Abnormal   Collection Time: 12/10/15  3:54 AM  Result Value Ref Range   Sodium 137 135 - 145 mmol/L   Potassium 4.7 3.5 - 5.1 mmol/L   Chloride 105 101 - 111 mmol/L   CO2 24 22 - 32 mmol/L   Glucose, Bld 169 (H) 65 - 99 mg/dL   BUN 29 (H) 6 - 20 mg/dL   Creatinine, Ser 2.81 (H) 0.61 - 1.24 mg/dL   Calcium 8.4 (L) 8.9 -  10.3 mg/dL   GFR calc non Af Amer 20 (L) >60 mL/min   GFR calc Af Amer 23 (L) >60 mL/min   Anion gap 8 5 - 15    Studies/Results: No results found.  Medications:REviewed  @PROBHOSP @  1  Atrial flutter  Rates are OK  No anticoag for now given CVA    2  HTN  Difficult with low HR and renal insuff  WIll change BiDil to Imdur  + Hydralazine  Follow   Note  Hx suspicious for sleep apnea  Can fall asleep at random times (driving, sitting)  Should prob be eval as outpt.    3  Chronic diastolic CHF  Volume status is OK    4  CVA  Neuro saw pt yesterday  Recomm  Holding anticoag for at least 1 week  Echo, carotid dopplers ordered  ASA 325 recommended  Continue statin    5  Noncompliiance   LOS: 1 day   Dorris Carnes 12/10/2015, 6:17 AM

## 2015-12-10 NOTE — Progress Notes (Signed)
[facility]                                                                              Patient Demographics  [name], is a 82 y.o. male, DOB - October 04, 1933, [id number]  Admit date - 12/08/2015   Admitting Physician Elwin Mocha, MD  Outpatient Primary MD for the patient is Philis Fendt, MD  LOS - 1  days    Chief Complaint  Patient presents with  . Fall  . Bradycardia       Brief HPI   Patient is a 82 year old male with CHF, diabetes, hypertension, atrial fibrillation, CAD, CK 80, noncompliance presented from home via EMS with fall. Patient had reported an EGD that he was kicked out of the nursing home and he cannot live with his daughter. Patient had fallen out of the recliner hitting his head on the ground but otherwise denied any loss of consciousness. EMS found him to be hypotensive and bradycardiac and was brought to ED.   Assessment & Plan   Bradycardia/a flutter.With history of alcohol abuse, noncompliance with medical regimen, recent admission with the diastolic CHF and atrial fibrillation. Patient was placed on anticoagulation, Eliquis but was not taking it. -  Cardiology was consulted, recommended adding amlodipine however recommended avoiding AV blocking medications due to bradycardia. - Not pacemaker candidate - No anti-coagulation for now, stroke team to follow  Acute CVA - Delayed presentation hence not a TPA candidate.  - MRI brain showed acute right thalamic infarct, no associated large vessel occlusion, no hemorrhagic transformation, hypertensive encephalopathy - MRA, 2-D echo, carotid Dopplers pending - Neurology consulted, for now continue aspirin, will await recommendations from stroke service team regarding anticoagulation - Lipid panel showed LDL 96 , goal less than 70, placed on statin - Hemoglobin A1 7.8 - PT evaluation recommending snf    hypertension. Patient with history of difficult to control blood pressure  related to noncompliance. Home medications include Bidil and Lasix - Continue Norvasc, BiDil and Lasix   Fall. - PT evaluation -> skilled nursing facility  chronic kidney disease stage III. creatinine 2.3 on admission. Chart review indicates this is close to baseline. -Hold nephrotoxins -Monitor urine output  Chronic anemia: Likely related to chronic disease. Hemoglobin 9.8 on admission, close to his baseline - Monitor CBC.   Diabetes uncontrolled with diabetic neuropathy and long-term insulin use.  - Hemoglobin A1c 7.8  - continue Lantus, sliding scale insulin  Bilateral lower extremities wounds - Wound care consult  Code Status: Full code  Family Communication: Discussed in detail with the patient, all imaging results, lab results explained to the patient    Disposition Plan:   Time Spent in minutes  25 minutes  Procedures  CT head, MRI brain  Consults   Cardiology Neurology  DVT Prophylaxis SCDs  Medications  Scheduled Meds: . amLODipine  10 mg Oral Daily  . atorvastatin  10 mg Oral q1800  . furosemide  40 mg Oral Daily  . hydrALAZINE  50 mg Oral 3 times per day  . insulin aspart  0-5 Units Subcutaneous QHS  . insulin aspart  0-9 Units Subcutaneous  TID WC  . insulin glargine  5 Units Subcutaneous QHS  . isosorbide mononitrate  60 mg Oral Daily  . LORazepam  1 mg Intravenous Once  . sodium chloride flush  3 mL Intravenous Q12H   Continuous Infusions:  PRN Meds:.acetaminophen **OR** acetaminophen, albuterol, bisacodyl, hydrALAZINE, ondansetron **OR** ondansetron (ZOFRAN) IV, oxyCODONE-acetaminophen, polyethylene glycol, senna-docusate   Antibiotics   Anti-infectives    None        Subjective:   [name] was seen and examined today. Denies any specific complaints.  No nausea, vomiting, chest pain, shortness of breath. No acute events overnight.    Objective:   Filed Vitals:   12/09/15 1415 12/09/15 2045 12/10/15 0419 12/10/15 1009  BP:  168/53 151/50 174/85 153/55  Pulse: 52 49 61   Temp: 98.3 F (36.8 C) 98.2 F (36.8 C) 98.6 F (37 C)   TempSrc: Oral Oral Oral   Resp: 23 20 17    Weight:   105.4 kg (232 lb 5.8 oz)   SpO2: 98% 99% 96%     Intake/Output Summary (Last 24 hours) at 12/10/15 1125 Last data filed at 12/10/15 0600  Gross per 24 hour  Intake      0 ml  Output   1050 ml  Net  -1050 ml     Wt Readings from Last 3 Encounters:  12/10/15 105.4 kg (232 lb 5.8 oz)  09/02/15 113.3 kg (249 lb 12.5 oz)  08/05/15 112.9 kg (248 lb 14.4 oz)     Exam  General: Alert and oriented x 3, NAD  HEENT:    Neck:   CVS: S1 S2 auscultated, no rubs, murmurs or gallops. Regular rate and rhythm.  Respiratory: Clear to auscultation bilaterally, no wheezing, rales or rhonchi  Abdomen: Soft, nontender, nondistended, + bowel sounds  [id number] lower extremity dressings intact  Neuro: Moving all 4 extremities  Skin: No rashes  Psych: Normal affect and demeanor, alert and oriented x3    Data Reviewed:  I have personally reviewed following labs and imaging studies  Micro Results No results found for this or any previous visit (from the past 240 hour(s)).  Radiology Reports Ct Head Wo Contrast  12/08/2015  CLINICAL DATA:  Pain following fall EXAM: CT HEAD WITHOUT CONTRAST TECHNIQUE: Contiguous axial images were obtained from the base of the skull through the vertex without intravenous contrast. COMPARISON:  August 28, 2015 FINDINGS: There is age related volume loss. There is no intracranial mass, hemorrhage, extra-axial fluid collection, or midline shift. There is patchy small vessel disease in the centra semiovale bilaterally. There is a focal infarct in the lateral right thalamus near the junction with the posterior limb of the right internal capsule, not present previously and potentially representing a small recent infarct. Elsewhere gray-white compartments appear normal. The bony calvarium appears intact.  The mastoid air cells are clear. No intraorbital lesions are evident. 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 This focus of decreased attenuation which was not appreciable 3 months prior measures 7 x 7 mm. Elsewhere there is age related volume loss with patchy periventricular small vessel disease. No hemorrhage or mass effect. No extra-axial fluid collection. Electronically Signed   By: Lowella Grip III M.D.   On: 12/08/2015 14:33   Mr Brain Wo Contrast  12/08/2015  CLINICAL DATA:  Patient fell out of recliner and hit head on ground. Stroke risk factors include hypertension, diabetes mellitus, atrial fibrillation; evaluation  in ED revealed severe hypertension and bradycardia. EXAM: MRI HEAD WITHOUT CONTRAST TECHNIQUE: Multiplanar, multiecho pulse sequences of the brain and surrounding structures were obtained without intravenous contrast. COMPARISON:  CT head earlier today. FINDINGS: 1 Cm sized area of restricted diffusion affects the RIGHT lateral thalamus, possibly involving the medial aspect, posterior limb RIGHT internal capsule. This infarct is nonhemorrhagic. No other areas of acute infarction are seen. No features suggestive of hypertensive encephalopathy. No mass lesion or extra-axial fluid. Generalized atrophy. Hydrocephalus ex vacuo. Extensive white matter disease. Flow voids are maintained. Partial empty sella. Upper cervical region unremarkable. No tonsillar herniation. No posttraumatic sequelae are seen with regard to the recent fall. Negative orbits, sinuses, and mastoids. No significant scalp hematoma. IMPRESSION: Acute RIGHT thalamic infarct. No associated large vessel occlusion. No hemorrhagic transformation or posttraumatic sequelae. Atrophy and small vessel disease. No features suggestive of hypertensive encephalopathy. Electronically Signed   By: Staci Righter M.D.   On: 12/08/2015 21:16     Dg Chest Port 1 View  12/08/2015  CLINICAL DATA:  Patient fell from is recliner striking a carpeted surface without complaint of injury; patient found to be hypertensive and bradycardic in and in atrial fibrillation. History of CHF, diabetes, and chronic renal insufficiency. EXAM: PORTABLE CHEST 1 VIEW COMPARISON:  Chest x-ray of August 28, 2015 and CT scan of the chest of August 29, 2015. FINDINGS: The lungs are reasonably well inflated. The interstitial markings are mildly prominent but less conspicuous than in the past. The cardiac silhouette remains enlarged. The pulmonary vascularity is mildly prominent centrally but also less conspicuous than in January. No pleural effusion is observed. IMPRESSION: Cardiomegaly with mild central pulmonary vascularity without definite peripheral edema or pleural effusion. No pneumonia is evident. Electronically Signed   By: David  Martinique M.D.   On: 12/08/2015 13:45    CBC  Recent Labs Lab 12/04/15 1431 12/08/15 1321 12/09/15 0507 12/10/15 0354  WBC 5.1 6.7 5.7 6.9  HGB 9.7* 9.8* 8.7* 9.0*  HCT 31.6* 32.1* 29.2* 29.4*  PLT 117* 141* 139* 131*  MCV 78.4 80.5 80.0 80.1  MCH 24.1* 24.6* 23.8* 24.5*  MCHC 30.7 30.5 29.8* 30.6  RDW 16.0* 16.8* 17.0* 17.0*    Chemistries   Recent Labs Lab 12/04/15 1431 12/08/15 1321 12/09/15 0507 12/10/15 0354  NA 136 141 139 137  K 4.4 4.8 4.5 4.7  CL 104 107 107 105  CO2 22 25 25 24   GLUCOSE 197* 148* 253* 169*  BUN 32* 24* 25* 29*  CREATININE 2.55* 2.33* 2.61* 2.81*  CALCIUM 8.8* 8.6* 8.2* 8.4*  AST 10*  --   --   --   ALT 8*  --   --   --   ALKPHOS 50  --   --   --   BILITOT 0.8  --   --   --    ------------------------------------------------------------------------------------------------------------------ estimated creatinine clearance is 26.1 mL/min (by C-G formula based on Cr of  2.81). ------------------------------------------------------------------------------------------------------------------  Recent Labs  12/08/15 1713  HGBA1C 7.8*   ------------------------------------------------------------------------------------------------------------------  Recent Labs  12/08/15 1713 12/09/15 0717  CHOL 157 138  HDL 33* 25*  LDLCALC 109* 96  TRIG 77 86  CHOLHDL 4.8 5.5   ------------------------------------------------------------------------------------------------------------------  Recent Labs  12/08/15 1713  TSH 1.265   ------------------------------------------------------------------------------------------------------------------ No results for input(s): VITAMINB12, FOLATE, FERRITIN, TIBC, IRON, RETICCTPCT in the last 72 hours.  Coagulation profile No results for input(s): INR, PROTIME in the last 168 hours.  No results for input(s): DDIMER in the  last 72 hours.  Cardiac Enzymes  Recent Labs Lab 12/08/15 1713  TROPONINI 0.04*   ------------------------------------------------------------------------------------------------------------------ Invalid input(s): POCBNP   Recent Labs  12/08/15 2147 12/09/15 0638 12/09/15 1107 12/09/15 1603 12/09/15 2209 12/10/15 0630  GLUCAP 121* 167* 141* 186* 146* 144*     RAI,RIPUDEEP M.D. [facility] 12/10/2015, 11:25 AM  Pager: [phone] Between 7am to 7pm - call Pager - [phone]  After 7pm go to [email or web address] - password [email or web address]  Call night coverage person covering after 7pm

## 2015-12-10 NOTE — Progress Notes (Signed)
STROKE TEAM PROGRESS NOTE   HISTORY OF PRESENT ILLNESS [name] is a 82 y.o. male who presented after fall. He does not feel that any focal findings. A head CT was obtained and demonstrated a age indeterminant thalamic infarct. An MRI was therefore performed which shows that this infarct is indeed acute. It is unclear if it happened at the time of the fall or indeed contributed to the fall, but this is certainly possible. He does not notice any symptoms or notice any acute change.   LKW: unclear tpa given?: no, unclear time of onset.    SUBJECTIVE (INTERVAL HISTORY) Symptoms improving.  Thinks close to baseline.    OBJECTIVE Temp:  [98.2 F (36.8 C)-98.6 F (37 C)] 98.6 F (37 C) (04/16 0419) Pulse Rate:  [49-61] 61 (04/16 0419) Cardiac Rhythm:  [-] Atrial flutter (04/15 2300) Resp:  [17-23] 17 (04/16 0419) BP: (151-174)/(50-85) 174/85 mmHg (04/16 0419) SpO2:  [96 %-99 %] 96 % (04/16 0419) Weight:  [105.4 kg (232 lb 5.8 oz)] 105.4 kg (232 lb 5.8 oz) (04/16 0419)  CBC:  Recent Labs Lab 12/09/15 0507 12/10/15 0354  WBC 5.7 6.9  HGB 8.7* 9.0*  HCT 29.2* 29.4*  MCV 80.0 80.1  PLT 139* 131*    Basic Metabolic Panel:  Recent Labs Lab 12/09/15 0507 12/10/15 0354  NA 139 137  K 4.5 4.7  CL 107 105  CO2 25 24  GLUCOSE 253* 169*  BUN 25* 29*  CREATININE 2.61* 2.81*  CALCIUM 8.2* 8.4*    Lipid Panel:    Component Value Date/Time   CHOL 138 12/09/2015 0717   TRIG 86 12/09/2015 0717   HDL 25* 12/09/2015 0717   CHOLHDL 5.5 12/09/2015 0717   VLDL 17 12/09/2015 0717   LDLCALC 96 12/09/2015 0717   HgbA1c:  Lab Results  Component Value Date   HGBA1C 7.8* 12/08/2015   Urine Drug Screen:    Component Value Date/Time   LABOPIA NONE DETECTED 11/25/2014 2030   COCAINSCRNUR NONE DETECTED 11/25/2014 2030   LABBENZ NONE DETECTED 11/25/2014 2030   AMPHETMU NONE DETECTED 11/25/2014 2030   [place] DETECTED 11/25/2014 2030   LABBARB NONE DETECTED 11/25/2014 2030       IMAGING  Ct Head Wo Contrast  12/08/2015   Probable recent infarct lateral right thalamus with involvement of a portion of the posterior limb of the right internal capsule. This focus of decreased attenuation which was not appreciable 3 months prior measures 7 x 7 mm. Elsewhere there is age related volume loss with patchy periventricular small vessel disease. No hemorrhage or mass effect. No extra-axial fluid collection.     Mr Brain Wo Contrast  12/08/2015   Acute RIGHT thalamic infarct. No associated large vessel occlusion. No hemorrhagic transformation or posttraumatic sequelae. Atrophy and small vessel disease. No features suggestive of hypertensive encephalopathy.    Dg Chest Port 1 View 12/08/2015   Cardiomegaly with mild central pulmonary vascularity without definite peripheral edema or pleural effusion. No pneumonia is evident.    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 No large vessel occlusion or proximal flow limiting stenosis.   PHYSICAL EXAM    Physical Exam  Physical Exam  Constitutional: Appears well-developed and well-nourished.  Psych: Affect appropriate to situation Eyes: No scleral injection HENT: No OP obstrucion Head: Normocephalic.  Cardiovascular: Normal rate and regular rhythm.  Respiratory: Effort normal and breath sounds normal to anterior ascultation GI: Soft. No  distension. There is no tenderness.  Skin: WDI  Neuro: Mental Status: Patient is awake, alert, oriented to person, place, month, year, and situation. Patient is able to give a clear and coherent history. No signs of aphasia or neglect Cranial Nerves: II: Visual Fields are full. Pupils are equal, round, and reactive to light.  III,IV, VI: EOMI without ptosis or diploplia.  V: Facial sensation is symmetric to temperature VII: Facial movement is symmetric.  VIII:  hearing is intact to voice X: Uvula elevates symmetrically XI: Shoulder shrug is symmetric. XII: tongue is midline without atrophy or fasciculations.  Motor: Tone is normal. Bulk is normal. 5/5 strength was present in all four extremities.  Sensory: Sensation is symmetric to light touch and temperature in the arms and legs. Cerebellar: FNF and HKS are intact bilaterally   ASSESSMENT/PLAN [name] is a 82 y.o. male with history of congestive heart failure, diabetes mellitus, hypertension, coronary artery disease, chronic kidney disease, and atrial fibrillation on Eliquis presenting after a fall.  He did not receive IV t-PA due to unclear time of onset.  Stroke:  Non-dominant infarct secondary to small vessel disease.  MRI  Acute RIGHT thalamic infarct  MRA  consistent with intracranial atherosclerotic change  Carotid Doppler Bilateral: 1-39% ICA stenosis. Vertebral artery flow is antegrade.  2D Echo - pending  LDL 96  HgbA1c 7.8  VTE prophylaxis - SCDs  Diet heart healthy/carb modified Room service appropriate?: Yes; Fluid consistency:: Thin  Eliquis (apixaban) daily prior to admission, now on No antithrombotic  Patient counseled to be compliant with his antithrombotic medications  Ongoing aggressive stroke risk factor management  Therapy recommendations:  SNF recommended  Disposition:  Pending  Hypertension  Stable  Permissive hypertension (OK if < 220/120) but gradually normalize in 5-7 days  Hyperlipidemia  Home meds:  No lipid lowering medications prior to admission.  LDL 96, goal < 70  Now on Lipitor 10 mg daily - increased to 20 mg daily  Continue statin at discharge  Diabetes  HgbA1c pending - goal < 7.0  Uncontrolled  Other Stroke Risk Factors  Advanced age  Cigarette smoker, quit smoking 17 years ago.  Obesity, Body mass index is 32.86 kg/(m^2).   Coronary artery disease   Other Active Problems  Chronic kidney  disease  Anemia  Hospital day # 1  Mikey Bussing [facility] Pager [phone] 12/10/2015, 2:16 PM     To contact Stroke Continuity provider, please refer to [email or web address] After hours, contact General Neurology

## 2015-12-10 NOTE — Progress Notes (Signed)
Stroke Team Progress Note  HISTORY  82 y.o. male who presented after fall. He does not feel that any focal findings. A head CT was obtained and demonstrated a age indeterminant thalamic infarct. An MRI was therefore performed which shows that this infarct is indeed acute.   SUBJECTIVE His symptoms have improved.   OBJECTIVE Most recent Vital Signs: Filed Vitals:   12/09/15 1415 12/09/15 2045 12/10/15 0419 12/10/15 1009  BP: 168/53 151/50 174/85 153/55  Pulse: 52 49 61   Temp: 98.3 F (36.8 C) 98.2 F (36.8 C) 98.6 F (37 C)   TempSrc: Oral Oral Oral   Resp: 23 20 17    Weight:   232 lb 5.8 oz (105.4 kg)   SpO2: 98% 99% 96%    CBG (last 3)   Recent Labs  12/09/15 2209 12/10/15 0630 12/10/15 1231  GLUCAP 146* 144* 154*    IV Fluid Intake:     MEDICATIONS  . amLODipine  10 mg Oral Daily  . atorvastatin  10 mg Oral q1800  . furosemide  40 mg Oral Daily  . hydrALAZINE  50 mg Oral 3 times per day  . insulin aspart  0-5 Units Subcutaneous QHS  . insulin aspart  0-9 Units Subcutaneous TID WC  . insulin glargine  5 Units Subcutaneous QHS  . isosorbide mononitrate  60 mg Oral Daily  . LORazepam  1 mg Intravenous Once  . sodium chloride flush  3 mL Intravenous Q12H   PRN:  acetaminophen **OR** acetaminophen, albuterol, bisacodyl, hydrALAZINE, ondansetron **OR** ondansetron (ZOFRAN) IV, oxyCODONE-acetaminophen, polyethylene glycol, senna-docusate  Diet:  Diet heart healthy/carb modified Room service appropriate?: Yes; Fluid consistency:: Thin  liquids Activity: Up with assistance DVT Prophylaxis:  Hep sq, SCDs  CLINICALLY SIGNIFICANT STUDIES Basic Metabolic Panel:  Recent Labs Lab 12/09/15 0507 12/10/15 0354  NA 139 137  K 4.5 4.7  CL 107 105  CO2 25 24  GLUCOSE 253* 169*  BUN 25* 29*  CREATININE 2.61* 2.81*  CALCIUM 8.2* 8.4*   Liver Function Tests:  Recent Labs Lab 12/04/15 1431  AST 10*  ALT 8*  ALKPHOS 50  BILITOT 0.8  PROT 7.3  ALBUMIN 3.4*   CBC:   Recent Labs Lab 12/09/15 0507 12/10/15 0354  WBC 5.7 6.9  HGB 8.7* 9.0*  HCT 29.2* 29.4*  MCV 80.0 80.1  PLT 139* 131*   Coagulation: No results for input(s): LABPROT, INR in the last 168 hours. Cardiac Enzymes:  Recent Labs Lab 12/08/15 1713  TROPONINI 0.04*   Urinalysis:  Recent Labs Lab 12/04/15 1710  COLORURINE YELLOW  LABSPEC 1.014  PHURINE 6.0  GLUCOSEU 100*  HGBUR MODERATE*  BILIRUBINUR NEGATIVE  KETONESUR NEGATIVE  PROTEINUR >300*  NITRITE NEGATIVE  LEUKOCYTESUR NEGATIVE   Lipid Panel    Component Value Date/Time   CHOL 138 12/09/2015 0717   TRIG 86 12/09/2015 0717   HDL 25* 12/09/2015 0717   CHOLHDL 5.5 12/09/2015 0717   VLDL 17 12/09/2015 0717   LDLCALC 96 12/09/2015 0717   HgbA1C  Lab Results  Component Value Date   HGBA1C 7.8* 12/08/2015    Urine Drug Screen:     Component Value Date/Time   LABOPIA NONE DETECTED 11/25/2014 2030   COCAINSCRNUR NONE DETECTED 11/25/2014 2030   LABBENZ NONE DETECTED 11/25/2014 2030   AMPHETMU NONE DETECTED 11/25/2014 2030   THCU NONE DETECTED 11/25/2014 2030   LABBARB NONE DETECTED 11/25/2014 2030    Alcohol Level: No results for input(s): ETH in the last 168 hours.  Ct Head Wo Contrast  12/08/2015  CLINICAL DATA:  Pain following fall EXAM: CT HEAD WITHOUT CONTRAST TECHNIQUE: Contiguous axial images were obtained from the base of the skull through the vertex without intravenous contrast. COMPARISON:  August 28, 2015 FINDINGS: There is age related volume loss. There is no intracranial mass, hemorrhage, extra-axial fluid collection, or midline shift. There is patchy small vessel disease in the centra semiovale bilaterally. There is a focal infarct in the lateral right thalamus near the junction with the posterior limb of the right internal capsule, not present previously and potentially representing a small recent infarct. Elsewhere gray-white compartments appear normal. The bony calvarium appears intact. The  mastoid air cells are clear. No intraorbital lesions are evident. 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 This focus of decreased attenuation which was not appreciable 3 months prior measures 7 x 7 mm. Elsewhere there is age related volume loss with patchy periventricular small vessel disease. No hemorrhage or mass effect. No extra-axial fluid collection. Electronically Signed   By: Lowella Grip III M.D.   On: 12/08/2015 14:33   Mr Jodene Nam Head Wo Contrast  12/10/2015  CLINICAL DATA:  Acute RIGHT thalamic infarct. Stroke risk factors include diabetes, hypertension, and atrial fibrillation. EXAM: MRA HEAD WITHOUT CONTRAST TECHNIQUE: Angiographic images of the Circle of Willis were obtained using MRA technique without intravenous contrast. COMPARISON:  MR brain 12/08/2015. FINDINGS: The cervical, petrous, cavernous, and supraclinoid internal carotid arteries do not show a significant stenosis. Similarly, the basilar artery shows no significant stenosis or irregularity. Both vertebral arteries contribute to its formation, slightly larger on the LEFT. Moderately diseased RIGHT A1 ACA with tandem stenoses potentially flow reducing. Smoothly tapering distal M1 MCA on the RIGHT. Severely diseased MCA M2 bifurcation branches on the RIGHT. Moderately irregular M 3 branches on the RIGHT. Unremarkable LEFT A1 ACA. Mildly irregular LEFT M1 MCA. Moderately diseased LEFT M2 and M3 branches. No proximal flow-limiting stenosis. Mild irregularity of both proximal P1 segments. Poor flow related enhancement in the LEFT greater than RIGHT posterior cerebral arteries. Moderately diseased BILATERAL superior cerebellar arteries. Anterior inferior and posterior inferior cerebellar branches poorly visualized. No intracranial aneurysm. IMPRESSION: Constellation of findings most consistent with intracranial atherosclerotic change of the  diabetic type. No large vessel occlusion or proximal flow limiting stenosis. Electronically Signed   By: Staci Righter M.D.   On: 12/10/2015 11:52   Mr Brain Wo Contrast  12/08/2015  CLINICAL DATA:  Patient fell out of recliner and hit head on ground. Stroke risk factors include hypertension, diabetes mellitus, atrial fibrillation; evaluation in ED revealed severe hypertension and bradycardia. EXAM: MRI HEAD WITHOUT CONTRAST TECHNIQUE: Multiplanar, multiecho pulse sequences of the brain and surrounding structures were obtained without intravenous contrast. COMPARISON:  CT head earlier today. FINDINGS: 1 Cm sized area of restricted diffusion affects the RIGHT lateral thalamus, possibly involving the medial aspect, posterior limb RIGHT internal capsule. This infarct is nonhemorrhagic. No other areas of acute infarction are seen. No features suggestive of hypertensive encephalopathy. No mass lesion or extra-axial fluid. Generalized atrophy. Hydrocephalus ex vacuo. Extensive white matter disease. Flow voids are maintained. Partial empty sella. Upper cervical region unremarkable. No tonsillar herniation. No posttraumatic sequelae are seen with regard to the recent fall. Negative orbits, sinuses, and mastoids. No significant scalp hematoma. IMPRESSION: Acute RIGHT thalamic infarct. No associated large vessel occlusion. No hemorrhagic transformation or posttraumatic sequelae. Atrophy and small vessel disease. No  features suggestive of hypertensive encephalopathy. Electronically Signed   By: Staci Righter M.D.   On: 12/08/2015 21:16    CT of the brain  Probable recent infarct lateral right thalamus with involvement of a portion of the posterior limb of the right internal capsule  MRI of the brain  Acute RIGHT thalamic infarct. No associated large vessel occlusion. No hemorrhagic transformation or posttraumatic sequelae  MRA of the brain  Constellation of findings most consistent with  intracranial atherosclerotic change of the diabetic type.  No large vessel occlusion or proximal flow limiting stenosis.  Carotid Doppler  Pending   2D Echocardiogram  pending  CXR    EKG  A-fib  Therapy Recommendations pending   Physical Exam  Physical Exam  Constitutional: Appears well-developed and well-nourished.  Psych: Affect appropriate to situation Eyes: No scleral injection HENT: No OP obstrucion Head: Normocephalic.  Cardiovascular: Normal rate and regular rhythm.  Respiratory: Effort normal and breath sounds normal to anterior ascultation GI: Soft. No distension. There is no tenderness.  Skin: WDI  Neuro: Mental Status: Patient is awake, alert, oriented to person, place, month, year, and situation. Patient is able to give a clear and coherent history. No signs of aphasia or neglect Cranial Nerves: II: Visual Fields are full. Pupils are equal, round, and reactive to light.  III,IV, VI: EOMI without ptosis or diploplia.  V: Facial sensation is symmetric to temperature VII: Facial movement is symmetric.  VIII: hearing is intact to voice X: Uvula elevates symmetrically XI: Shoulder shrug is symmetric. XII: tongue is midline without atrophy or fasciculations.  Motor: Tone is normal. Bulk is normal. 5/5 strength was present in all four extremities.  Sensory: Sensation is symmetric to light touch and temperature in the arms and legs. Cerebellar: FNF and HKS are intact bilaterally   ASSESSMENT 82 y.o. male who presented after fall. He does not feel that any focal findings. A head CT was obtained and demonstrated a age indeterminant thalamic infarct. An MRI was therefore performed which shows that this infarct is indeed acute.       LDL 96     Hospital day # 1  TREATMENT/PLAN  Add  aspirin 81 mg daily for secondary stroke prevention.  Statin  Would anticoagulate the pt in about 3 days due to small size of stroke   Pt/ot   Leotis Pain   SIGNED

## 2015-12-11 ENCOUNTER — Inpatient Hospital Stay (HOSPITAL_COMMUNITY): Payer: Medicare Other

## 2015-12-11 DIAGNOSIS — N184 Chronic kidney disease, stage 4 (severe): Secondary | ICD-10-CM

## 2015-12-11 DIAGNOSIS — I6789 Other cerebrovascular disease: Secondary | ICD-10-CM

## 2015-12-11 LAB — GLUCOSE, CAPILLARY
GLUCOSE-CAPILLARY: 147 mg/dL — AB (ref 65–99)
GLUCOSE-CAPILLARY: 143 mg/dL — AB (ref 65–99)
Glucose-Capillary: 135 mg/dL — ABNORMAL HIGH (ref 65–99)

## 2015-12-11 LAB — BASIC METABOLIC PANEL
ANION GAP: 8 (ref 5–15)
BUN: 32 mg/dL — ABNORMAL HIGH (ref 6–20)
CALCIUM: 8.3 mg/dL — AB (ref 8.9–10.3)
CHLORIDE: 105 mmol/L (ref 101–111)
CO2: 24 mmol/L (ref 22–32)
Creatinine, Ser: 2.93 mg/dL — ABNORMAL HIGH (ref 0.61–1.24)
GFR calc non Af Amer: 19 mL/min — ABNORMAL LOW (ref >60)
GFR, EST AFRICAN AMERICAN: 22 mL/min — AB (ref >60)
GLUCOSE: 179 mg/dL — AB (ref 65–99)
Potassium: 4.6 mmol/L (ref 3.5–5.1)
Sodium: 137 mmol/L (ref 135–145)

## 2015-12-11 LAB — CBC
HEMATOCRIT: 29.4 % — AB (ref 39.0–52.0)
HEMOGLOBIN: 8.9 g/dL — AB (ref 13.0–17.0)
MCH: 24.1 pg — ABNORMAL LOW (ref 26.0–34.0)
MCHC: 30.3 g/dL (ref 30.0–36.0)
MCV: 79.7 fL (ref 78.0–100.0)
Platelets: 148 10*3/uL — ABNORMAL LOW (ref 150–400)
RBC: 3.69 MIL/uL — ABNORMAL LOW (ref 4.22–5.81)
RDW: 17 % — AB (ref 11.5–15.5)
WBC: 7.2 10*3/uL (ref 4.0–10.5)

## 2015-12-11 LAB — ECHOCARDIOGRAM COMPLETE: WEIGHTICAEL: 3717.84 [oz_av]

## 2015-12-11 MED ORDER — STROKE: EARLY STAGES OF RECOVERY BOOK
Freq: Once | Status: AC
  Administered 2015-12-11: 11:00:00
  Filled 2015-12-11: qty 1

## 2015-12-11 MED ORDER — APIXABAN 2.5 MG PO TABS
2.5000 mg | ORAL_TABLET | Freq: Two times a day (BID) | ORAL | Status: DC
  Administered 2015-12-11 – 2015-12-12 (×3): 2.5 mg via ORAL
  Filled 2015-12-11 (×3): qty 1

## 2015-12-11 NOTE — Progress Notes (Signed)
ANTICOAGULATION CONSULT NOTE - Initial Consult  Pharmacy Consult for eliquis Indication: atrial fibrillation  No Known Allergies  Patient Measurements: Weight: 232 lb 5.8 oz (105.4 kg)  Vital Signs: Temp: 98.3 F (36.8 C) (04/17 0324) Temp Source: Oral (04/17 0324) BP: 145/59 mmHg (04/17 0324) Pulse Rate: 59 (04/17 0324)  Labs:  Recent Labs  12/08/15 1713 12/09/15 0507 12/10/15 0354 12/11/15 0254  HGB  --  8.7* 9.0* 8.9*  HCT  --  29.2* 29.4* 29.4*  PLT  --  139* 131* 148*  CREATININE  --  2.61* 2.81* 2.93*  TROPONINI 0.04*  --   --   --     Estimated Creatinine Clearance: 25.1 mL/min (by C-G formula based on Cr of 2.93).   Medical History: Past Medical History  Diagnosis Date  . CHF (congestive heart failure) ([place])   . Diabetes mellitus without complication ([place])   . Hypertension   . Coronary artery disease   . Atrial fibrillation ([place])   . Chronic kidney disease     Medications:  Prescriptions prior to admission  Medication Sig Dispense Refill Last Dose  . albuterol (PROVENTIL) (2.5 MG/3ML) 0.083% nebulizer solution Take 3 mLs (2.5 mg total) by nebulization every 4 (four) hours as needed for wheezing or shortness of breath. 75 mL 12 PRN  . amLODipine (NORVASC) 5 MG tablet Take 5 mg by mouth daily.   UNK  . aspirin EC 81 MG tablet Take 81 mg by mouth daily.   UNK  . furosemide (LASIX) 40 MG tablet Take 1 tablet (40 mg total) by mouth daily. 30 tablet 0 UNK    Assessment: fall 82 y/o M previous left vs kicked out of NH present after fall from his recliner. Pt was hypotensive and bradycardic with aflutter. CVA  PMH: CHF, diabetes, hypertension, atrial fibrillation, CAD, noncompliance.  Anticoagulation: h/o afib. New CVA. Placed on Eliquis previously but not taking it.  82 y/o (82th birthday in < 2 months), Scr 2.93, weight 105 kg  Cards: CHF, afib, HTN,   Endo: A1C 7.8  Neuro: h/o alcohol abuse, noncompliant. Acute CVA. LDL 96.  Renal:  CKD  Heme/Onc: chronic anemia  Goal of Therapy:  Oral anticoagulation Monitor platelets by anticoagulation protocol: Yes   Plan:  Eliquis 2.5mg  BID based on elevated Scr and very nearly 82 y/o   [name], PharmD, BCPS Clinical Staff Pharmacist Pager [phone]  [name] 12/11/2015,12:58 PM

## 2015-12-11 NOTE — Progress Notes (Signed)
PATIENT ID: [name] is a 46M with chronic diastolic heart failure, hypertension, atrial flutter with slow ventricular response here with acute stroke.   INTERVAL HISTORY: Started on anticoagulation today.   SUBJECTIVE:  Denies any physical complaints.  Upset that his daughter is not taking care of him and wants to put him in a nursing home.    PHYSICAL EXAM Filed Vitals:   12/10/15 1500 12/10/15 2001 12/11/15 0324 12/11/15 1409  BP: 162/60 178/81 145/59 147/45  Pulse: 68 70 59 63  Temp: 98.4 F (36.9 C) 98.3 F (36.8 C) 98.3 F (36.8 C) 98.4 F (36.9 C)  TempSrc: Oral Oral Oral Oral  Resp: 18 16 17 18   Weight:      SpO2: 99% 96% 98% 100%   General:  Well-appearing.  No acute distress.  Neck: No JVD Lungs:  CTAB.  No crackles, rhonchi or wheezes Heart:  Irregularly irregular.  Bradycardic.  No m/r/g.  Normal S1/S2.  Abdomen:  Soft, NT, ND.  +BS Extremities:  WWP.  No edema.   LABS: Lab Results  Component Value Date   TROPONINI 0.04* 12/08/2015   Results for orders placed or performed during the hospital encounter of 12/08/15 (from the past 24 hour(s))  Glucose, capillary     Status: Abnormal   Collection Time: 12/10/15  5:02 PM  Result Value Ref Range   Glucose-Capillary 151 (H) 65 - 99 mg/dL  CBC     Status: Abnormal   Collection Time: 12/11/15  2:54 AM  Result Value Ref Range   WBC 7.2 4.0 - 10.5 K/uL   RBC 3.69 (L) 4.22 - 5.81 MIL/uL   Hemoglobin 8.9 (L) 13.0 - 17.0 g/dL   HCT 29.4 (L) 39.0 - 52.0 %   MCV 79.7 78.0 - 100.0 fL   MCH 24.1 (L) 26.0 - 34.0 pg   MCHC 30.3 30.0 - 36.0 g/dL   RDW 17.0 (H) 11.5 - 15.5 %   Platelets 148 (L) 150 - 400 K/uL  Basic metabolic panel     Status: Abnormal   Collection Time: 12/11/15  2:54 AM  Result Value Ref Range   Sodium 137 135 - 145 mmol/L   Potassium 4.6 3.5 - 5.1 mmol/L   Chloride 105 101 - 111 mmol/L   CO2 24 22 - 32 mmol/L   Glucose, Bld 179 (H) 65 - 99 mg/dL   BUN 32 (H) 6 - 20 mg/dL   Creatinine, Ser  2.93 (H) 0.61 - 1.24 mg/dL   Calcium 8.3 (L) 8.9 - 10.3 mg/dL   GFR calc non Af Amer 19 (L) >60 mL/min   GFR calc Af Amer 22 (L) >60 mL/min   Anion gap 8 5 - 15  Glucose, capillary     Status: Abnormal   Collection Time: 12/11/15  6:39 AM  Result Value Ref Range   Glucose-Capillary 135 (H) 65 - 99 mg/dL   Comment 1 Notify RN    Comment 2 Document in Chart   Glucose, capillary     Status: Abnormal   Collection Time: 12/11/15 12:09 PM  Result Value Ref Range   Glucose-Capillary 147 (H) 65 - 99 mg/dL   Comment 1 Notify RN    Comment 2 Document in Chart     Intake/Output Summary (Last 24 hours) at 12/11/15 1647 Last data filed at 12/11/15 1230  Gross per 24 hour  Intake    840 ml  Output   2000 ml  Net  -1160 ml    Telemetry:  Atrial flutter.  Ventricular response 40s-60s.   ASSESSMENT AND PLAN:  Principal Problem:   Bradycardia Active Problems:   Chronic diastolic CHF (congestive heart failure) (H[facility])   Uncontrolled diabetes mellitus with diabetic nephropathy, with long-term current use of insulin ([facility])   Accelerated hypertension   Noncompliance with medications   Atrial flutter ([facility])   Fall   Anemia   Chronic kidney disease   Acute CVA (cerebrovascular accident) ([place])    # Atrial flutter: Ventricular response bradycardic.  Patient is asymptomatic, though he has not been up and walking.  He is not on any nodal agents.  Started Eliquis today for anticoagulation. This patients CHA2DS2-VASc Score and unadjusted Ischemic Stroke Rate (% per year) is equal to 9.7 % stroke rate/year from a score of 6  Above score calculated as 1 point each if present [CHF, HTN, DM, Vascular=MI/PAD/Aortic Plaque, Age if 65-74, or Male] Above score calculated as 2 points each if present [Age > 75, or Stroke/TIA/TE]      # Hypertension: BP is better controlled.  Continue amlodipine, hydralazine, and imdur.  No nodal agents given bradycardia.  No ACE-I/ARB 2/2 CKD.  # Chronic diastolic heart  failure:  Euvolemic.  BP control as above.  Continue lasix.   # Stroke: Per Neuro   Reita Shindler C. Oval Linsey, MD, [facility] 12/11/2015 4:47 PM

## 2015-12-11 NOTE — Progress Notes (Signed)
CSW is continuing to follow pt for placement needs- left message for [facility] where pt prefers to go concerning bed availability for tomorrow  CSW will continue to follow  Domenica Reamer, [place] Social Worker [phone]

## 2015-12-11 NOTE — Progress Notes (Signed)
STROKE TEAM PROGRESS NOTE   SUBJECTIVE (INTERVAL HISTORY) Patient reports he still has L hand weakness.   OBJECTIVE Temp:  [98.3 F (36.8 C)-98.4 F (36.9 C)] 98.3 F (36.8 C) (04/17 0324) Pulse Rate:  [59-70] 59 (04/17 0324) Cardiac Rhythm:  [-] Atrial fibrillation (04/16 1900) Resp:  [16-18] 17 (04/17 0324) BP: (145-178)/(55-81) 145/59 mmHg (04/17 0324) SpO2:  [96 %-99 %] 98 % (04/17 0324)  CBC:   Recent Labs Lab 12/10/15 0354 12/11/15 0254  WBC 6.9 7.2  HGB 9.0* 8.9*  HCT 29.4* 29.4*  MCV 80.1 79.7  PLT 131* 148*    Basic Metabolic Panel:   Recent Labs Lab 12/10/15 0354 12/11/15 0254  NA 137 137  K 4.7 4.6  CL 105 105  CO2 24 24  GLUCOSE 169* 179*  BUN 29* 32*  CREATININE 2.81* 2.93*  CALCIUM 8.4* 8.3*    Lipid Panel:     Component Value Date/Time   CHOL 138 12/09/2015 0717   TRIG 86 12/09/2015 0717   HDL 25* 12/09/2015 0717   CHOLHDL 5.5 12/09/2015 0717   VLDL 17 12/09/2015 0717   LDLCALC 96 12/09/2015 0717   HgbA1c:  Lab Results  Component Value Date   HGBA1C 7.8* 12/08/2015   Urine Drug Screen:     Component Value Date/Time   LABOPIA NONE DETECTED 11/25/2014 2030   COCAINSCRNUR NONE DETECTED 11/25/2014 2030   LABBENZ NONE DETECTED 11/25/2014 2030   AMPHETMU NONE DETECTED 11/25/2014 2030   [place] DETECTED 11/25/2014 2030   LABBARB NONE DETECTED 11/25/2014 2030      IMAGING  Ct Head Wo Contrast  12/08/2015   Probable recent infarct lateral right thalamus with involvement of a portion of the posterior limb of the right internal capsule. This focus of decreased attenuation which was not appreciable 3 months prior measures 7 x 7 mm. Elsewhere there is age related volume loss with patchy periventricular small vessel disease. No hemorrhage or mass effect. No extra-axial fluid collection.   Mr Brain Wo Contrast  12/08/2015   Acute RIGHT thalamic infarct. No associated large vessel occlusion. No hemorrhagic transformation or posttraumatic  sequelae. Atrophy and small vessel disease. No features suggestive of hypertensive encephalopathy.   Dg Chest Port 1 View 12/08/2015   Cardiomegaly with mild central pulmonary vascularity without definite peripheral edema or pleural effusion. No pneumonia is evident.   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 No large vessel occlusion or proximal flow limiting stenosis.   PHYSICAL EXAM Constitutional: Appears well-developed and well-nourished.  Psych: Affect appropriate to situation Eyes: No scleral injection HENT: No OP obstrucion Head: Normocephalic.  Cardiovascular: Normal rate and regular rhythm.  Respiratory: Effort normal and breath sounds normal to anterior ascultation GI: Soft. No distension. There is no tenderness.  Skin: WDI  Neuro: Mental Status: Patient is awake, alert, oriented to person, place, month, year, and situation. Patient is able to give a clear and coherent history. No signs of aphasia or neglect Cranial Nerves: II: Visual Fields are full. Pupils are equal, round, and reactive to light.  III,IV, VI: EOMI without ptosis or diploplia.  V: Facial sensation is symmetric to temperature VII: Facial movement is symmetric.  VIII: hearing is intact to voice X: Uvula elevates symmetrically XI: Shoulder shrug is symmetric. XII: tongue is midline without atrophy or fasciculations.  Motor: Tone is normal. Bulk is normal. 5/5 strength was present in all four extremities except diminished fine finger movements  on left and orbits right over left upper extremity.  Sensory: Sensation is symmetric to light touch and temperature in the arms and legs. Cerebellar: FNF and HKS are intact bilaterally   ASSESSMENT/PLAN [name] is a 82 y.o. male with history of congestive heart failure, diabetes mellitus, hypertension, coronary artery disease,  chronic kidney disease, and atrial fibrillation on Eliquis presenting after a fall.  He did not receive IV t-PA due to unclear time of onset.  Stroke:  R thalamic infarct secondary to small vessel disease.He also has AFIB  MRI  Acute RIGHT thalamic infarct  MRA  consistent with intracranial atherosclerotic change  Carotid Doppler Bilateral: 1-39% ICA stenosis. Vertebral artery flow is antegrade.  2D Echo - pending   LDL 96  HgbA1c 7.8  VTE prophylaxis - SCDs Diet heart healthy/carb modified Room service appropriate?: Yes; Fluid consistency:: Thin  Ordered Eliquis (apixaban) daily prior to admission but he was not taking,  on aspirin 325 mg daily in the hospital given recommendations to hold anticoagulation x 1 week due to stroke. Ok to resume eliquis today and stop aspirin. Orders adjusted.  Patient counseled to be compliant with his antithrombotic medications  Ongoing aggressive stroke risk factor management  Therapy recommendations:  SNF recommended  Disposition:  SNF planned today  Follow up with DR. Roselia [name]'s NP in 2 months, order placed  Atrial Fibrillation  Home anticoagulation:  Ordered Eliquis (apixaban) daily prior to admission but he was not taking  Ok to resume eliquis on discharge    Hypertension  Stable  Permissive hypertension (OK if < 220/120) but gradually normalize in 5-7 days  Hyperlipidemia  Home meds:  No lipid lowering medications prior to admission.  LDL 96, goal < 70  Now on Lipitor 20 mg daily  Continue statin at discharge  Diabetes, unctontrolled with diabetic neuropathy and insulin use  HgbA1c 7.8 - goal < 7.0  Uncontrolled  Other Stroke Risk Factors  Advanced age  Cigarette smoker, quit smoking 17 years ago.  Hx ETOH abuse  Obesity, Body mass index is 32.86 kg/(m^2).   Coronary artery disease  Chronic diastolic CHF, recent admission  Other Active Problems  Medical noncompliance   Chronic kidney disease stage  III  Chronic Anemia  BLE una boots  Hospital day # 2  [name],[name]  [name] for Pager information 12/11/2015 9:41 AM  I have personally examined this patient, reviewed notes, independently viewed imaging studies, participated in medical decision making and plan of care. I have made any additions or clarifications directly to the above note. Agree with note above.  Stroke team will sign off. Call for questions.  Antony Contras, MD Medical Director [facility] Pager: [phone] 12/11/2015 1:45 PM  To contact Stroke Continuity provider, please refer to [email or web address] After hours, contact General Neurology

## 2015-12-11 NOTE — Discharge Instructions (Signed)

## 2015-12-11 NOTE — Progress Notes (Signed)
PT Cancellation Note  Patient Details Name: [name] MRN: [id number] DOB: 1934-05-21   Cancelled Treatment:    Reason Eval/Treat Not Completed: Patient at procedure or test/unavailable.  Will try later as time allows.   [name] 12/11/2015, 10:40 AM   Mee Hives, PT MS Acute Rehab Dept. Number: [facility] [id number] and [place] [phone]

## 2015-12-11 NOTE — Progress Notes (Signed)
  Echocardiogram 2D Echocardiogram has been performed.  Johny Chess 12/11/2015, 10:51 AM

## 2015-12-11 NOTE — Progress Notes (Signed)
[facility]                                                                              Patient Demographics  [name], is a 82 y.o. male, DOB - 06/02/1934, [id number]  Admit date - 12/08/2015   Admitting Physician [name] Mocha, MD  Outpatient Primary MD for the patient is [name] Fendt, MD  LOS - 2  days    Chief Complaint  Patient presents with  . Fall  . Bradycardia       Brief HPI   Patient is a 82 year old male with CHF, diabetes, hypertension, atrial fibrillation, CAD, CK 80, noncompliance presented from home via EMS with fall. Patient had reported an EGD that he was kicked out of the nursing home and he cannot live with his daughter. Patient had fallen out of the recliner hitting his head on the ground but otherwise denied any loss of consciousness. EMS found him to be hypotensive and bradycardiac and was brought to ED.   Assessment & Plan   Bradycardia/a flutter.With history of alcohol abuse, noncompliance with medical regimen, recent admission with the diastolic CHF and atrial fibrillation. Patient was placed on anticoagulation, Eliquis but was not taking it. -  Cardiology was consulted, recommended adding amlodipine however recommended avoiding AV blocking medications due to bradycardia. - Not pacemaker candidate -Per neurology, start on anti-coag ablation today  Acute CVA - Delayed presentation hence not a TPA candidate.  - MRI brain showed acute right thalamic infarct, no associated large vessel occlusion, no hemorrhagic transformation, hypertensive encephalopathy - MRI showed intracranial at her cirrhotic changes of diabetic type, no large vessel occlusion - Carotid Dopplers 1-39% ICA stenosis - 2-D echo showed EF of 55-60% - Per neurology, discontinue aspirin and start on anticoagulation - Lipid panel showed LDL 96 , goal less than 70, placed on statin - Hemoglobin A1 7.8 - PT evaluation recommending snf    hypertension.  Patient with history of difficult to control blood pressure related to noncompliance. Home medications include Bidil and Lasix - Continue Norvasc, BiDil and Lasix   Fall. - PT evaluation -> skilled nursing facility  chronic kidney disease stage III. creatinine 2.3 on admission. Chart review indicates this is close to baseline. -Hold nephrotoxins -Monitor urine output  Chronic anemia: Likely related to chronic disease. Hemoglobin 9.8 on admission, close to his baseline - H&H stable  Diabetes uncontrolled with diabetic neuropathy and long-term insulin use.  - Hemoglobin A1c 7.8  - continue Lantus, sliding scale insulin  Bilateral lower extremities wounds - Wound care consult appreciated  Code Status: Full code  Family Communication: Discussed in detail with the patient, all imaging results, lab results explained to the patient    Disposition Plan: New skilled nursing facility, discussed with social work, DC in AM  Time Spent in minutes  25 minutes  Procedures  CT head, MRI brain  Consults   Cardiology Neurology  DVT Prophylaxis SCDs  Medications  Scheduled Meds: .  stroke: mapping our early stages of recovery book   Does not apply Once  . amLODipine  10 mg Oral Daily  . atorvastatin  20  mg Oral q1800  . furosemide  40 mg Oral Daily  . hydrALAZINE  50 mg Oral 3 times per day  . insulin aspart  0-5 Units Subcutaneous QHS  . insulin aspart  0-9 Units Subcutaneous TID WC  . insulin glargine  5 Units Subcutaneous QHS  . isosorbide mononitrate  60 mg Oral Daily  . LORazepam  1 mg Intravenous Once  . sodium chloride flush  3 mL Intravenous Q12H   Continuous Infusions:  PRN Meds:.acetaminophen **OR** acetaminophen, albuterol, bisacodyl, hydrALAZINE, ondansetron **OR** ondansetron (ZOFRAN) IV, oxyCODONE-acetaminophen, polyethylene glycol, senna-docusate   Antibiotics   Anti-infectives    None        Subjective:   [name] was seen and examined today.  Denies any specific complaints.  No nausea, vomiting, chest pain, shortness of breath. No acute events overnight.    Objective:   Filed Vitals:   12/10/15 1009 12/10/15 1500 12/10/15 2001 12/11/15 0324  BP: 153/55 162/60 178/81 145/59  Pulse:  68 70 59  Temp:  98.4 F (36.9 C) 98.3 F (36.8 C) 98.3 F (36.8 C)  TempSrc:  Oral Oral Oral  Resp:  18 16 17   Weight:      SpO2:  99% 96% 98%    Intake/Output Summary (Last 24 hours) at 12/11/15 1210 Last data filed at 12/11/15 [id number]  Gross per 24 hour  Intake    600 ml  Output   1600 ml  Net  -1000 ml     Wt Readings from Last 3 Encounters:  12/10/15 105.4 kg (232 lb 5.8 oz)  09/02/15 113.3 kg (249 lb 12.5 oz)  08/05/15 112.9 kg (248 lb 14.4 oz)     Exam  General: Alert and oriented x 3, NAD  HEENT:    Neck:   CVS: S1 S2clear  Respiratory: CTAB, no rhonchi   Abdomen: Soft, nontender, nondistended, + bowel sounds  [id number] lower extremity dressings intact  Neuro: Moving all 4 extremities  Skin: No rashes  Psych: Normal affect and demeanor, alert and oriented x3    Data Reviewed:  I have personally reviewed following labs and imaging studies  Micro Results No results found for this or any previous visit (from the past 240 hour(s)).  Radiology Reports Ct Head Wo Contrast  12/08/2015  CLINICAL DATA:  Pain following fall EXAM: CT HEAD WITHOUT CONTRAST TECHNIQUE: Contiguous axial images were obtained from the base of the skull through the vertex without intravenous contrast. COMPARISON:  August 28, 2015 FINDINGS: There is age related volume loss. There is no intracranial mass, hemorrhage, extra-axial fluid collection, or midline shift. There is patchy small vessel disease in the centra semiovale bilaterally. There is a focal infarct in the lateral right thalamus near the junction with the posterior limb of the right internal capsule, not present previously and potentially representing a small recent infarct.  Elsewhere gray-white compartments appear normal. The bony calvarium appears intact. The mastoid air cells are clear. No intraorbital lesions are evident. 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 This focus of decreased attenuation which was not appreciable 3 months prior measures 7 x 7 mm. Elsewhere there is age related volume loss with patchy periventricular small vessel disease. No hemorrhage or mass effect. No extra-axial fluid collection. Electronically Signed   By: Lowella Grip III M.D.   On: 12/08/2015 14:33   Mr Jodene Nam Head Wo Contrast  12/10/2015  CLINICAL DATA:  Acute RIGHT  thalamic infarct. Stroke risk factors include diabetes, hypertension, and atrial fibrillation. EXAM: MRA HEAD WITHOUT CONTRAST TECHNIQUE: Angiographic images of the Circle of Willis were obtained using MRA technique without intravenous contrast. COMPARISON:  MR brain 12/08/2015. FINDINGS: The cervical, petrous, cavernous, and supraclinoid internal carotid arteries do not show a significant stenosis. Similarly, the basilar artery shows no significant stenosis or irregularity. Both vertebral arteries contribute to its formation, slightly larger on the LEFT. Moderately diseased RIGHT A1 ACA with tandem stenoses potentially flow reducing. Smoothly tapering distal M1 MCA on the RIGHT. Severely diseased MCA M2 bifurcation branches on the RIGHT. Moderately irregular M 3 branches on the RIGHT. Unremarkable LEFT A1 ACA. Mildly irregular LEFT M1 MCA. Moderately diseased LEFT M2 and M3 branches. No proximal flow-limiting stenosis. Mild irregularity of both proximal P1 segments. Poor flow related enhancement in the LEFT greater than RIGHT posterior cerebral arteries. Moderately diseased BILATERAL superior cerebellar arteries. Anterior inferior and posterior inferior cerebellar branches poorly visualized. No intracranial aneurysm. IMPRESSION:  Constellation of findings most consistent with intracranial atherosclerotic change of the diabetic type. No large vessel occlusion or proximal flow limiting stenosis. Electronically Signed   By: Staci Righter M.D.   On: 12/10/2015 11:52   Mr Brain Wo Contrast  12/08/2015  CLINICAL DATA:  Patient fell out of recliner and hit head on ground. Stroke risk factors include hypertension, diabetes mellitus, atrial fibrillation; evaluation in ED revealed severe hypertension and bradycardia. EXAM: MRI HEAD WITHOUT CONTRAST TECHNIQUE: Multiplanar, multiecho pulse sequences of the brain and surrounding structures were obtained without intravenous contrast. COMPARISON:  CT head earlier today. FINDINGS: 1 Cm sized area of restricted diffusion affects the RIGHT lateral thalamus, possibly involving the medial aspect, posterior limb RIGHT internal capsule. This infarct is nonhemorrhagic. No other areas of acute infarction are seen. No features suggestive of hypertensive encephalopathy. No mass lesion or extra-axial fluid. Generalized atrophy. Hydrocephalus ex vacuo. Extensive white matter disease. Flow voids are maintained. Partial empty sella. Upper cervical region unremarkable. No tonsillar herniation. No posttraumatic sequelae are seen with regard to the recent fall. Negative orbits, sinuses, and mastoids. No significant scalp hematoma. IMPRESSION: Acute RIGHT thalamic infarct. No associated large vessel occlusion. No hemorrhagic transformation or posttraumatic sequelae. Atrophy and small vessel disease. No features suggestive of hypertensive encephalopathy. Electronically Signed   By: Staci Righter M.D.   On: 12/08/2015 21:16   Dg Chest Port 1 View  12/08/2015  CLINICAL DATA:  Patient fell from is recliner striking a carpeted surface without complaint of injury; patient found to be hypertensive and bradycardic in and in atrial fibrillation. History of CHF, diabetes, and chronic renal insufficiency. EXAM: PORTABLE CHEST 1  VIEW COMPARISON:  Chest x-ray of August 28, 2015 and CT scan of the chest of August 29, 2015. FINDINGS: The lungs are reasonably well inflated. The interstitial markings are mildly prominent but less conspicuous than in the past. The cardiac silhouette remains enlarged. The pulmonary vascularity is mildly prominent centrally but also less conspicuous than in January. No pleural effusion is observed. IMPRESSION: Cardiomegaly with mild central pulmonary vascularity without definite peripheral edema or pleural effusion. No pneumonia is evident. Electronically Signed   By: David  Martinique M.D.   On: 12/08/2015 13:45    CBC  Recent Labs Lab 12/04/15 1431 12/08/15 1321 12/09/15 0507 12/10/15 0354 12/11/15 0254  WBC 5.1 6.7 5.7 6.9 7.2  HGB 9.7* 9.8* 8.7* 9.0* 8.9*  HCT 31.6* 32.1* 29.2* 29.4* 29.4*  PLT 117* 141* 139* 131* 148*  MCV 78.4 80.5  80.0 80.1 79.7  MCH 24.1* 24.6* 23.8* 24.5* 24.1*  MCHC 30.7 30.5 29.8* 30.6 30.3  RDW 16.0* 16.8* 17.0* 17.0* 17.0*    Chemistries   Recent Labs Lab 12/04/15 1431 12/08/15 1321 12/09/15 0507 12/10/15 0354 12/11/15 0254  NA 136 141 139 137 137  K 4.4 4.8 4.5 4.7 4.6  CL 104 107 107 105 105  CO2 22 25 25 24 24   GLUCOSE 197* 148* 253* 169* 179*  BUN 32* 24* 25* 29* 32*  CREATININE 2.55* 2.33* 2.61* 2.81* 2.93*  CALCIUM 8.8* 8.6* 8.2* 8.4* 8.3*  AST 10*  --   --   --   --   ALT 8*  --   --   --   --   ALKPHOS 50  --   --   --   --   BILITOT 0.8  --   --   --   --    ------------------------------------------------------------------------------------------------------------------ estimated creatinine clearance is 25.1 mL/min (by C-G formula based on Cr of 2.93). ------------------------------------------------------------------------------------------------------------------  Recent Labs  12/08/15 1713  HGBA1C 7.8*    ------------------------------------------------------------------------------------------------------------------  Recent Labs  12/08/15 1713 12/09/15 0717  CHOL 157 138  HDL 33* 25*  LDLCALC 109* 96  TRIG 77 86  CHOLHDL 4.8 5.5   ------------------------------------------------------------------------------------------------------------------  Recent Labs  12/08/15 1713  TSH 1.265   ------------------------------------------------------------------------------------------------------------------ No results for input(s): VITAMINB12, FOLATE, FERRITIN, TIBC, IRON, RETICCTPCT in the last 72 hours.  Coagulation profile No results for input(s): INR, PROTIME in the last 168 hours.  No results for input(s): DDIMER in the last 72 hours.  Cardiac Enzymes  Recent Labs Lab 12/08/15 1713  TROPONINI 0.04*   ------------------------------------------------------------------------------------------------------------------ Invalid input(s): POCBNP   Recent Labs  12/09/15 2209 12/10/15 0630 12/10/15 1018 12/10/15 1231 12/10/15 1702 12/11/15 0639  GLUCAP 146* 144* 202* 154* 151* 135*     RAI,RIPUDEEP M.D. [facility] 12/11/2015, 12:10 PM  Pager: [phone] Between 7am to 7pm - call Pager - [phone]  After 7pm go to [email or web address] - password [email or web address]  Call night coverage person covering after 7pm

## 2015-12-11 NOTE — Consult Note (Signed)
WOC consult requested for BLE.  This was performed on 4/15; refer to progress notes for assessment and plan of care.  Ortho tech can change [facility] Q Sat and Wed. Please re-consult if further assistance is needed.  Thank-you,  Julien Girt MSN, [place], [place], [place], [place]

## 2015-12-12 LAB — CBC
HCT: 29.9 % — ABNORMAL LOW (ref 39.0–52.0)
HEMOGLOBIN: 9.1 g/dL — AB (ref 13.0–17.0)
MCH: 24.1 pg — AB (ref 26.0–34.0)
MCHC: 30.4 g/dL (ref 30.0–36.0)
MCV: 79.3 fL (ref 78.0–100.0)
Platelets: 146 10*3/uL — ABNORMAL LOW (ref 150–400)
RBC: 3.77 MIL/uL — AB (ref 4.22–5.81)
RDW: 17 % — ABNORMAL HIGH (ref 11.5–15.5)
WBC: 7.1 10*3/uL (ref 4.0–10.5)

## 2015-12-12 LAB — BASIC METABOLIC PANEL
Anion gap: 9 (ref 5–15)
BUN: 40 mg/dL — ABNORMAL HIGH (ref 6–20)
CHLORIDE: 104 mmol/L (ref 101–111)
CO2: 24 mmol/L (ref 22–32)
Calcium: 8.5 mg/dL — ABNORMAL LOW (ref 8.9–10.3)
Creatinine, Ser: 2.85 mg/dL — ABNORMAL HIGH (ref 0.61–1.24)
GFR calc non Af Amer: 20 mL/min — ABNORMAL LOW (ref >60)
GFR, EST AFRICAN AMERICAN: 23 mL/min — AB (ref >60)
Glucose, Bld: 152 mg/dL — ABNORMAL HIGH (ref 65–99)
POTASSIUM: 4.8 mmol/L (ref 3.5–5.1)
SODIUM: 137 mmol/L (ref 135–145)

## 2015-12-12 LAB — HEMOGLOBIN A1C
HEMOGLOBIN A1C: 7.7 % — AB (ref 4.8–5.6)
MEAN PLASMA GLUCOSE: 174 mg/dL

## 2015-12-12 LAB — GLUCOSE, CAPILLARY
GLUCOSE-CAPILLARY: 154 mg/dL — AB (ref 65–99)
GLUCOSE-CAPILLARY: 134 mg/dL — AB (ref 65–99)
GLUCOSE-CAPILLARY: 229 mg/dL — AB (ref 65–99)

## 2015-12-12 MED ORDER — AMLODIPINE BESYLATE 10 MG PO TABS
10.0000 mg | ORAL_TABLET | Freq: Every day | ORAL | Status: DC
Start: 2015-12-12 — End: 2016-09-18

## 2015-12-12 MED ORDER — INSULIN GLARGINE 100 UNIT/ML ~~LOC~~ SOLN: 10.0000 [IU] | Freq: Every day | SUBCUTANEOUS | Status: DC

## 2015-12-12 MED ORDER — OXYCODONE-ACETAMINOPHEN 5-325 MG PO TABS: 1.0000 | ORAL_TABLET | Freq: Four times a day (QID) | ORAL | Status: DC | PRN

## 2015-12-12 MED ORDER — ISOSORBIDE MONONITRATE ER 60 MG PO TB24: 60.0000 mg | ORAL_TABLET | Freq: Every day | ORAL | Status: DC

## 2015-12-12 MED ORDER — ATORVASTATIN CALCIUM 20 MG PO TABS: 20.0000 mg | ORAL_TABLET | Freq: Every day | ORAL | Status: DC

## 2015-12-12 MED ORDER — INSULIN ASPART 100 UNIT/ML ~~LOC~~ SOLN: 0.0000 [IU] | Freq: Three times a day (TID) | SUBCUTANEOUS | Status: DC

## 2015-12-12 MED ORDER — INSULIN GLARGINE 100 UNIT/ML ~~LOC~~ SOLN
10.0000 [IU] | Freq: Every day | SUBCUTANEOUS | Status: DC
  Filled 2015-12-12: qty 0.1

## 2015-12-12 MED ORDER — APIXABAN 2.5 MG PO TABS: 2.5000 mg | ORAL_TABLET | Freq: Two times a day (BID) | ORAL | Status: DC

## 2015-12-12 MED ORDER — INSULIN ASPART 100 UNIT/ML ~~LOC~~ SOLN: 0.0000 [IU] | Freq: Every day | SUBCUTANEOUS | Status: DC

## 2015-12-12 MED ORDER — HYDRALAZINE HCL 50 MG PO TABS
50.0000 mg | ORAL_TABLET | Freq: Three times a day (TID) | ORAL | Status: DC
Start: 2015-12-12 — End: 2016-09-18

## 2015-12-12 MED ORDER — POLYETHYLENE GLYCOL 3350 17 G PO PACK: 17.0000 g | PACK | Freq: Every day | ORAL | Status: DC | PRN

## 2015-12-12 MED ORDER — SENNOSIDES-DOCUSATE SODIUM 8.6-50 MG PO TABS: 1.0000 | ORAL_TABLET | Freq: Every evening | ORAL | Status: DC | PRN

## 2015-12-12 NOTE — Clinical Social Work Placement (Signed)
   CLINICAL SOCIAL WORK PLACEMENT  NOTE  Date:  12/12/2015  Patient Details  Name: [name] MRN: [id number] Date of Birth: 12/30/1933  Clinical Social Work is seeking post-discharge placement for this patient at the [place] level of care (*CSW will initial, date and re-position this form in  chart as items are completed):  Yes   Patient/family provided with [place] Work Department's list of facilities offering this level of care within the geographic area requested by the patient (or if unable, by the patient's family).  Yes   Patient/family informed of their freedom to choose among providers that offer the needed level of care, that participate in Medicare, Medicaid or managed care program needed by the patient, have an available bed and are willing to accept the patient.  Yes   Patient/family informed of [facility]'s ownership interest in [facility] and [facility], as well as of the fact that they are under no obligation to receive care at these facilities.  PASRR submitted to EDS on       PASRR number received on       Existing PASRR number confirmed on 12/12/15     FL2 transmitted to all facilities in geographic area requested by pt/family on 12/09/15     FL2 transmitted to all facilities within larger geographic area on       Patient informed that his/her managed care company has contracts with or will negotiate with certain facilities, including the following:        Yes   Patient/family informed of bed offers received.  Patient chooses bed at [facility]     Physician recommends and patient chooses bed at      Patient to be transferred to [facility] on 12/12/15.  Patient to be transferred to facility by       Patient family notified on 12/12/15 of transfer.  Name of family member notified:  CSW attempted to reach daughter.     PHYSICIAN Please prepare priority discharge summary, including  medications, Please sign FL2, Please prepare prescriptions     Additional Comment:  Per MD patient is ready to discharge to [facility]. RN, patient, patient's family, and facility notified of discharge. RN given phone number for report and transport packet is on patient's chart. Ambulance transport requested. CSW signing off.   _______________________________________________ Raelyn Number, LCSW [phone] 12/12/2015, 2:55 PM

## 2015-12-12 NOTE — Care Management Note (Signed)
Case Management Note Marvetta Gibbons RN, BSN Unit 2W-Case Manager [phone]  Patient Details  Name: Rakem Rabun MRN: [id number] Date of Birth: Dec 31, 1933  Subjective/Objective:   Pt admitted with bradycardia and acute CVA                 Action/Plan: PTA pt lived at home- plan for d/c SNF- CSW following for placement needs  Expected Discharge Date:    12/12/15              Expected Discharge Plan:  Skilled Nursing Facility  In-House Referral:  Clinical Social Work  Discharge planning Services  CM Consult  Post Acute Care Choice:    Choice offered to:     DME Arranged:    DME Agency:     HH Arranged:    [place] Agency:     Status of Service:  Completed, signed off  Medicare Important Message Given:    Date Medicare IM Given:    Medicare IM give by:    Date Additional Medicare IM Given:    Additional Medicare Important Message give by:     If discussed at [place] of Stay Meetings, dates discussed:    Discharge Disposition: skilled facility   Additional Comments:  Dawayne Patricia, RN 12/12/2015, 10:21 AM

## 2015-12-12 NOTE — Plan of Care (Signed)
Problem: Education: Goal: Knowledge of secondary prevention will improve Outcome: Completed/Met Date Met:  12/12/15 Handout given to Pt and information discussed.  Pt indicates understanding.

## 2015-12-12 NOTE — Care Management Important Message (Signed)
Important Message  Patient Details  Name: [name] MRN: [id number] Date of Birth: [birth year]   Medicare Important Message Given:  Yes    [name] Abena 12/12/2015, 11:02 AM

## 2015-12-12 NOTE — Clinical Documentation Improvement (Signed)
Internal Medicine  Can the diagnosis of anemia be further specified?   Iron deficiency Anemia  Nutritional anemia, including the nutrition or mineral deficits  Chronic Anemia, including the suspected or known cause  Anemia of chronic kidney disease,    Other  Clinically Undetermined  Associated diagnoses/conditions. CKD3, HTN, CHF Supporting Information: 12/11/15: Chronic anemia: Likely related to chronic disease Component     Latest Ref Rng 12/08/2015 12/09/2015 12/10/2015 12/11/2015             RBC     4.22 - 5.81 MIL/uL 3.99 (L) 3.65 (L) 3.67 (L) 3.69 (L)  Hemoglobin     13.0 - 17.0 g/dL 9.8 (L) 8.7 (L) 9.0 (L) 8.9 (L)  HCT     39.0 - 52.0 % 32.1 (L) 29.2 (L) 29.4 (L) 29.4 (L)    Please exercise your independent, professional judgment when responding. A specific answer is not anticipated or expected. Please update your documentation within the medical record to reflect your response to this query. Thank you  Thank You,  [place] [phone]

## 2015-12-12 NOTE — Clinical Social Work Note (Signed)
PASRR number: [id number] [place] MSW, [place], [place], [id number]

## 2015-12-12 NOTE — Discharge Summary (Signed)
Physician Discharge Summary   Patient ID: [name] MRN: [id number] DOB/AGE: August 13, 1934 82 y.o.  Admit date: 12/08/2015 Discharge date: 12/12/2015  Primary Care Physician:  Philis Fendt, MD  Discharge Diagnoses:   . Acute CVA (cerebrovascular accident) ([place]) . Atrial flutter ([place]) . Bradycardia . Chronic diastolic CHF (congestive heart failure) ([place]) . Accelerated hypertension . Fall . Anemia . Chronic kidney disease Hyperkeratosis and skin changes on the left lower extremity/venous stasis  Consults: Cardiology Neurology   Recommendations for Outpatient Follow-up:  1. Continue physical therapy  2. Please repeat CBC/BMET at next visit 3. Maintain UNNA boots,  change bilaterally 2 times a week Saturday and Wednesdays   DIET: Car modified diet   Allergies:  No Known Allergies   DISCHARGE MEDICATIONS: Current Discharge Medication List    START taking these medications   Details  apixaban (ELIQUIS) 2.5 MG TABS tablet Take 1 tablet (2.5 mg total) by mouth 2 (two) times daily. Qty: 60 tablet    atorvastatin (LIPITOR) 20 MG tablet Take 1 tablet (20 mg total) by mouth daily at 6 PM.    hydrALAZINE (APRESOLINE) 50 MG tablet Take 1 tablet (50 mg total) by mouth every 8 (eight) hours.    insulin aspart (NOVOLOG) 100 UNIT/ML injection Inject 0-9 Units into the skin 3 (three) times daily with meals. Sliding scale CBG 70 - 120: 0 units CBG 121 - 150: 1 unit,  CBG 151 - 200: 2 units,  CBG 201 - 250: 3 units,  CBG 251 - 300: 5 units,  CBG 301 - 350: 7 units,  CBG 351 - 400: 9 units   CBG > 400: 9 units and notify your MD Qty: 10 mL, Refills: 11    insulin glargine (LANTUS) 100 UNIT/ML injection Inject 0.1 mLs (10 Units total) into the skin at bedtime. Qty: 10 mL, Refills: 11    isosorbide mononitrate (IMDUR) 60 MG 24 hr tablet Take 1 tablet (60 mg total) by mouth daily.    senna-docusate (SENOKOT-S) 8.6-50 MG tablet Take 1 tablet by mouth at bedtime as needed for  moderate constipation.      CONTINUE these medications which have CHANGED   Details  amLODipine (NORVASC) 10 MG tablet Take 1 tablet (10 mg total) by mouth daily.    oxyCODONE-acetaminophen (PERCOCET/ROXICET) 5-325 MG tablet Take 1 tablet by mouth every 6 (six) hours as needed for moderate pain or severe pain. Qty: 20 tablet, Refills: 0    polyethylene glycol (MIRALAX) packet Take 17 g by mouth daily as needed. Qty: 14 each, Refills: 0      CONTINUE these medications which have NOT CHANGED   Details  albuterol (PROVENTIL) (2.5 MG/3ML) 0.083% nebulizer solution Take 3 mLs (2.5 mg total) by nebulization every 4 (four) hours as needed for wheezing or shortness of breath. Qty: 75 mL, Refills: 12    furosemide (LASIX) 40 MG tablet Take 1 tablet (40 mg total) by mouth daily. Qty: 30 tablet, Refills: 0      STOP taking these medications     aspirin EC 81 MG tablet          Brief H and P: For complete details please refer to admission H and P, but in brief Patient is a 82 year old male with CHF, diabetes, hypertension, atrial fibrillation, CAD, CK 80, noncompliance presented from home via EMS with fall. Patient had reported an EGD that he was kicked out of the nursing home and he cannot live with his daughter. Patient had fallen out of  the recliner hitting his head on the ground but otherwise denied any loss of consciousness. EMS found him to be hypotensive and bradycardiac and was brought to ED.  Hospital Course:  Bradycardia/a flutter.With history of alcohol abuse, noncompliance with medical regimen, recent admission with the diastolic CHF and atrial fibrillation. Patient was placed on anticoagulation, Eliquis but was not taking it. - Cardiology was consulted, recommended adding amlodipine however recommended avoiding AV blocking medications due to bradycardia. - Not pacemaker candidate. Per neurology, started on anti-coagulation.   Acute CVA - Delayed presentation hence not a TPA  candidate.  - MRI brain showed acute right thalamic infarct, no associated large vessel occlusion, no hemorrhagic transformation, hypertensive encephalopathy - MRI showed intracranial at her cirrhotic changes of diabetic type, no large vessel occlusion - Carotid Dopplers 1-39% ICA stenosis - 2-D echo showed EF of 55-60% - Per neurology, discontinue aspirin and start on anticoagulation, eliquis started on 4/17 - Lipid panel showed LDL 96 , goal less than 70, placed on statin - Hemoglobin A1 7.8 - PT evaluation recommending snf   hypertension. Patient with history of difficult to control blood pressure related to noncompliance. Home medications include Bidil and Lasix - Continue Norvasc, imdur and lasix  Fall. - PT evaluation -> skilled nursing facility  chronic kidney disease stage III. creatinine 2.3 on admission. Chart review indicates this is close to baseline. -Hold nephrotoxins - follow BMET  Chronic anemia: Likely related to chronic disease. Hemoglobin 9.8 on admission, close to his baseline - H&H stable  Diabetes uncontrolled with diabetic neuropathy and long-term insulin use.  - Hemoglobin A1c 7.8  - continue Lantus, sliding scale insulin  Bilateral lower extremities venous stasis changes - Wound care consult appreciated, maintain Unnna boots, change biweekly   Day of Discharge BP 150/73 mmHg  Pulse 59  Temp(Src) 98 F (36.7 C) (Oral)  Resp 18  Ht 5\' 10"  (1.778 m)  Wt 105.4 kg (232 lb 5.8 oz)  SpO2 99%  Physical Exam: General: Alert and awake oriented x3 not in any acute distress. HEENT: anicteric sclera, pupils reactive to light and accommodation CVS: S1-S2 clear no murmur rubs or gallops Chest: clear to auscultation bilaterally, no wheezing rales or rhonchi Abdomen: soft nontender, nondistended, normal bowel sounds Extremities:Lower extremity dressing intact Neuro: Cranial nerves II-XII intact, no new focal neurological deficits   The results of  significant diagnostics from this hospitalization (including imaging, microbiology, ancillary and laboratory) are listed below for reference.    LAB RESULTS: Basic Metabolic Panel:  Recent Labs Lab 12/11/15 0254 12/12/15 0331  NA 137 137  K 4.6 4.8  CL 105 104  CO2 24 24  GLUCOSE 179* 152*  BUN 32* 40*  CREATININE 2.93* 2.85*  CALCIUM 8.3* 8.5*   Liver Function Tests: No results for input(s): AST, ALT, ALKPHOS, BILITOT, PROT, ALBUMIN in the last 168 hours. No results for input(s): LIPASE, AMYLASE in the last 168 hours. No results for input(s): AMMONIA in the last 168 hours. CBC:  Recent Labs Lab 12/11/15 0254 12/12/15 0331  WBC 7.2 7.1  HGB 8.9* 9.1*  HCT 29.4* 29.9*  MCV 79.7 79.3  PLT 148* 146*   Cardiac Enzymes:  Recent Labs Lab 12/08/15 1713  TROPONINI 0.04*   BNP: Invalid input(s): POCBNP CBG:  Recent Labs Lab 12/11/15 2131 12/12/15 0620  GLUCAP 143* 134*    Significant Diagnostic Studies:  Ct Head Wo Contrast  12/08/2015  CLINICAL DATA:  Pain following fall EXAM: CT HEAD WITHOUT CONTRAST TECHNIQUE: Contiguous axial  images were obtained from the base of the skull through the vertex without intravenous contrast. COMPARISON:  August 28, 2015 FINDINGS: There is age related volume loss. There is no intracranial mass, hemorrhage, extra-axial fluid collection, or midline shift. There is patchy small vessel disease in the centra semiovale bilaterally. There is a focal infarct in the lateral right thalamus near the junction with the posterior limb of the right internal capsule, not present previously and potentially representing a small recent infarct. Elsewhere gray-white compartments appear normal. The bony calvarium appears intact. The mastoid air cells are clear. No intraorbital lesions are evident. 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 This focus of decreased  attenuation which was not appreciable 3 months prior measures 7 x 7 mm. Elsewhere there is age related volume loss with patchy periventricular small vessel disease. No hemorrhage or mass effect. No extra-axial fluid collection. Electronically Signed   By: Lowella Grip III M.D.   On: 12/08/2015 14:33   Mr Brain Wo Contrast  12/08/2015  CLINICAL DATA:  Patient fell out of recliner and hit head on ground. Stroke risk factors include hypertension, diabetes mellitus, atrial fibrillation; evaluation in ED revealed severe hypertension and bradycardia. EXAM: MRI HEAD WITHOUT CONTRAST TECHNIQUE: Multiplanar, multiecho pulse sequences of the brain and surrounding structures were obtained without intravenous contrast. COMPARISON:  CT head earlier today. FINDINGS: 1 Cm sized area of restricted diffusion affects the RIGHT lateral thalamus, possibly involving the medial aspect, posterior limb RIGHT internal capsule. This infarct is nonhemorrhagic. No other areas of acute infarction are seen. No features suggestive of hypertensive encephalopathy. No mass lesion or extra-axial fluid. Generalized atrophy. Hydrocephalus ex vacuo. Extensive white matter disease. Flow voids are maintained. Partial empty sella. Upper cervical region unremarkable. No tonsillar herniation. No posttraumatic sequelae are seen with regard to the recent fall. Negative orbits, sinuses, and mastoids. No significant scalp hematoma. IMPRESSION: Acute RIGHT thalamic infarct. No associated large vessel occlusion. No hemorrhagic transformation or posttraumatic sequelae. Atrophy and small vessel disease. No features suggestive of hypertensive encephalopathy. Electronically Signed   By: Staci Righter M.D.   On: 12/08/2015 21:16   Dg Chest Port 1 View  12/08/2015  CLINICAL DATA:  Patient fell from is recliner striking a carpeted surface without complaint of injury; patient found to be hypertensive and bradycardic in and in atrial fibrillation. History of  CHF, diabetes, and chronic renal insufficiency. EXAM: PORTABLE CHEST 1 VIEW COMPARISON:  Chest x-ray of August 28, 2015 and CT scan of the chest of August 29, 2015. FINDINGS: The lungs are reasonably well inflated. The interstitial markings are mildly prominent but less conspicuous than in the past. The cardiac silhouette remains enlarged. The pulmonary vascularity is mildly prominent centrally but also less conspicuous than in January. No pleural effusion is observed. IMPRESSION: Cardiomegaly with mild central pulmonary vascularity without definite peripheral edema or pleural effusion. No pneumonia is evident. Electronically Signed   By: David  [place] M.D.   On: 12/08/2015 13:45    2D ECHO: Study Conclusions  - Left ventricle: Abnormal septal motion  The cavity size was normal. Wall thickness was increased in a  pattern of mild LVH. Systolic function was normal. The estimated  ejection fraction was in the range of 55% to 60%. - Aortic valve: There was trivial regurgitation. - Left atrium: The atrium was moderately dilated. - Atrial septum: No defect or patent foramen ovale was identified. - Pulmonary arteries: PA  peak pressure: 35 mm Hg (S). - Pericardium, extracardiac: A trivial pericardial effusion was  identified posterior to the heart.  Disposition and Follow-up: Discharge Instructions    Ambulatory referral to Neurology    Complete by:  As directed   This is a routine stroke follow up. An appointment is requested in 1 month with NP     Diet Carb Modified    Complete by:  As directed      Increase activity slowly    Complete by:  As directed             DISPOSITION: SNF   DISCHARGE FOLLOW-UP Follow-up Information    Follow up with MARTIN,NANCY CAROLYN, NP In 2 months.   Specialty:  Family Medicine   Why:  Stroke Clinic, Office will call you with appointment date & time   Contact information:   [address] [place] [address] [phone]        Follow up with Philis Fendt, MD. Schedule an appointment as soon as possible for a visit in 2 weeks.   Specialty:  Internal Medicine   Why:  for hospital follow-up   Contact information:   [place] [address] [phone]        Time spent on Discharge: 46mins  Signed:   Dietra Stokely M.D. [facility] 12/12/2015, 10:29 AM Pager: [phone]

## 2015-12-12 NOTE — Progress Notes (Signed)
Order received to discharge Pt.  Telemetry removed and CCMD notified.  IV removed with catheter intact.  Valuables returned from security and verified with Pt.  Discharge education provided and reiterated Pt education on s/s of stroke, activating 911 and medication to prevent further strokes.  Pt indicates understanding, states he will be compliant with his medication this time.  Pt denies chest pain or sob.  Pt stable at discharge.  Report called to [employer].  Pt discharged via PTAR to facility.

## 2015-12-12 NOTE — Progress Notes (Signed)
Physical Therapy Treatment Patient Details Name: [name] MRN: [id number] DOB: 01/05/1934 Today's Date: 12/12/2015    History of Present Illness 82 yo male with onset of fall from his recliner at home with daughter and findings of R lateral thalamic infarct, with PMHx:  a-fib, a-flutter, HTN, CHF, noncompliance    PT Comments    Pt is able to understand the instructions of PT but does not agree about his need for assistance.  He is fortunately up in the chair for breakfast but upset because eggs were not on his plate (not on the ticket), but nursing addressing with him.  Will anticipate PT continuing with him as he is appropriate for SNF but will likely refuse the care.  Follow Up Recommendations  SNF     Equipment Recommendations  None recommended by PT    Recommendations for Other Services Rehab consult     Precautions / Restrictions Precautions Precautions: Fall (telemetry) Precaution Comments: agitated and upset about his meal arriving without his eggs Restrictions Weight Bearing Restrictions: No    Mobility  Bed Mobility Overal bed mobility: Needs Assistance Bed Mobility: Supine to Sit     Supine to sit: Min assist;Min guard     General bed mobility comments: HOB up with bed rail  Transfers Overall transfer level: Needs assistance Equipment used: Rolling walker (2 wheeled);1 person hand held assist (Pt set walker aside and then could not walk far) Transfers: Stand Pivot Transfers;Sit to/from Stand Sit to Stand: Min assist Stand pivot transfers: Min assist       General transfer comment: cued hand placement and sequence  Ambulation/Gait             General Gait Details: up without walker so could only step a few feet   Stairs            Wheelchair Mobility    Modified Rankin (Stroke Patients Only) Modified Rankin (Stroke Patients Only) Pre-Morbid Rankin Score: Moderate disability Modified Rankin: Moderately severe disability      Balance Overall balance assessment: Needs assistance Sitting-balance support: Bilateral upper extremity supported;Feet supported Sitting balance-Leahy Scale: Good     Standing balance support: Bilateral upper extremity supported Standing balance-Leahy Scale: Fair                      Cognition Arousal/Alertness: Awake/alert Behavior During Therapy: Agitated Overall Cognitive Status: Impaired/Different from baseline Area of Impairment: Safety/judgement;Awareness;Problem solving   Current Attention Level: Selective Memory: Decreased short-term memory;Decreased recall of precautions Following Commands: Follows one step commands with increased time Safety/Judgement: Decreased awareness of deficits;Decreased awareness of safety Awareness: Intellectual Problem Solving: Slow processing;Requires verbal cues General Comments: did not revisit the conversation about SNF    Exercises      General Comments General comments (skin integrity, edema, etc.): Pt needs to walk farther but due to his agitation did not do much walking with declining of walker.  He would benefit from nursing assisting him today and will expect PT to continue with him      Pertinent Vitals/Pain Pain Assessment: No/denies pain    Home Living                      Prior Function            PT Goals (current goals can now be found in the care plan section) Acute Rehab PT Goals Patient Stated Goal: none stated Progress towards PT goals: Progressing toward goals    Frequency  Min 3X/week    PT Plan Current plan remains appropriate    Co-evaluation             End of Session   Activity Tolerance: Patient tolerated treatment well Patient left: in chair;with call bell/phone within reach;with chair alarm set     Time: [id number] PT Time Calculation (min) (ACUTE ONLY): 17 min  Charges:  $Therapeutic Activity: 8-22 mins                    G Codes:      [name] 12/14/2015,  8:36 AM   Mee Hives, PT MS Acute Rehab Dept. Number: [facility] [phone] and [place] [phone]

## 2015-12-14 ENCOUNTER — Ambulatory Visit: Payer: Medicare Other | Admitting: Internal Medicine

## 2015-12-21 ENCOUNTER — Encounter: Payer: Self-pay | Admitting: *Deleted

## 2016-01-15 ENCOUNTER — Encounter (HOSPITAL_BASED_OUTPATIENT_CLINIC_OR_DEPARTMENT_OTHER): Payer: Medicare Other | Attending: Internal Medicine

## 2016-01-15 DIAGNOSIS — I5022 Chronic systolic (congestive) heart failure: Secondary | ICD-10-CM | POA: Diagnosis not present

## 2016-01-15 DIAGNOSIS — N189 Chronic kidney disease, unspecified: Secondary | ICD-10-CM | POA: Insufficient documentation

## 2016-01-15 DIAGNOSIS — I4891 Unspecified atrial fibrillation: Secondary | ICD-10-CM | POA: Diagnosis not present

## 2016-01-15 DIAGNOSIS — Z87891 Personal history of nicotine dependence: Secondary | ICD-10-CM | POA: Diagnosis not present

## 2016-01-15 DIAGNOSIS — E1122 Type 2 diabetes mellitus with diabetic chronic kidney disease: Secondary | ICD-10-CM | POA: Diagnosis not present

## 2016-01-15 DIAGNOSIS — I2510 Atherosclerotic heart disease of native coronary artery without angina pectoris: Secondary | ICD-10-CM | POA: Diagnosis not present

## 2016-01-15 DIAGNOSIS — I87323 Chronic venous hypertension (idiopathic) with inflammation of bilateral lower extremity: Secondary | ICD-10-CM | POA: Insufficient documentation

## 2016-01-25 ENCOUNTER — Encounter (HOSPITAL_BASED_OUTPATIENT_CLINIC_OR_DEPARTMENT_OTHER): Payer: Medicare Other | Attending: Internal Medicine

## 2016-01-25 DIAGNOSIS — I132 Hypertensive heart and chronic kidney disease with heart failure and with stage 5 chronic kidney disease, or end stage renal disease: Secondary | ICD-10-CM | POA: Insufficient documentation

## 2016-01-25 DIAGNOSIS — E1140 Type 2 diabetes mellitus with diabetic neuropathy, unspecified: Secondary | ICD-10-CM | POA: Diagnosis not present

## 2016-01-25 DIAGNOSIS — I87323 Chronic venous hypertension (idiopathic) with inflammation of bilateral lower extremity: Secondary | ICD-10-CM | POA: Diagnosis present

## 2016-01-25 DIAGNOSIS — I87321 Chronic venous hypertension (idiopathic) with inflammation of right lower extremity: Secondary | ICD-10-CM | POA: Diagnosis not present

## 2016-01-25 DIAGNOSIS — E1122 Type 2 diabetes mellitus with diabetic chronic kidney disease: Secondary | ICD-10-CM | POA: Diagnosis not present

## 2016-01-25 DIAGNOSIS — I87332 Chronic venous hypertension (idiopathic) with ulcer and inflammation of left lower extremity: Secondary | ICD-10-CM | POA: Insufficient documentation

## 2016-01-25 DIAGNOSIS — Z8673 Personal history of transient ischemic attack (TIA), and cerebral infarction without residual deficits: Secondary | ICD-10-CM | POA: Insufficient documentation

## 2016-01-25 DIAGNOSIS — N186 End stage renal disease: Secondary | ICD-10-CM | POA: Insufficient documentation

## 2016-01-25 DIAGNOSIS — L97221 Non-pressure chronic ulcer of left calf limited to breakdown of skin: Secondary | ICD-10-CM | POA: Diagnosis not present

## 2016-01-25 DIAGNOSIS — I2510 Atherosclerotic heart disease of native coronary artery without angina pectoris: Secondary | ICD-10-CM | POA: Insufficient documentation

## 2016-01-25 DIAGNOSIS — R600 Localized edema: Secondary | ICD-10-CM | POA: Diagnosis not present

## 2016-01-25 DIAGNOSIS — I509 Heart failure, unspecified: Secondary | ICD-10-CM | POA: Diagnosis not present

## 2016-01-25 DIAGNOSIS — R001 Bradycardia, unspecified: Secondary | ICD-10-CM | POA: Diagnosis not present

## 2016-01-25 DIAGNOSIS — L97821 Non-pressure chronic ulcer of other part of left lower leg limited to breakdown of skin: Secondary | ICD-10-CM | POA: Diagnosis not present

## 2016-02-05 DIAGNOSIS — I87332 Chronic venous hypertension (idiopathic) with ulcer and inflammation of left lower extremity: Secondary | ICD-10-CM | POA: Diagnosis not present

## 2016-02-12 DIAGNOSIS — I87332 Chronic venous hypertension (idiopathic) with ulcer and inflammation of left lower extremity: Secondary | ICD-10-CM | POA: Diagnosis not present

## 2016-02-19 DIAGNOSIS — I87332 Chronic venous hypertension (idiopathic) with ulcer and inflammation of left lower extremity: Secondary | ICD-10-CM | POA: Diagnosis not present

## 2016-02-26 ENCOUNTER — Encounter (HOSPITAL_BASED_OUTPATIENT_CLINIC_OR_DEPARTMENT_OTHER): Payer: Medicare Other | Attending: Internal Medicine

## 2016-02-26 DIAGNOSIS — L97821 Non-pressure chronic ulcer of other part of left lower leg limited to breakdown of skin: Secondary | ICD-10-CM | POA: Insufficient documentation

## 2016-02-26 DIAGNOSIS — L97811 Non-pressure chronic ulcer of other part of right lower leg limited to breakdown of skin: Secondary | ICD-10-CM | POA: Insufficient documentation

## 2016-02-26 DIAGNOSIS — I509 Heart failure, unspecified: Secondary | ICD-10-CM | POA: Insufficient documentation

## 2016-02-26 DIAGNOSIS — E1140 Type 2 diabetes mellitus with diabetic neuropathy, unspecified: Secondary | ICD-10-CM | POA: Insufficient documentation

## 2016-02-26 DIAGNOSIS — E1122 Type 2 diabetes mellitus with diabetic chronic kidney disease: Secondary | ICD-10-CM | POA: Insufficient documentation

## 2016-02-26 DIAGNOSIS — N186 End stage renal disease: Secondary | ICD-10-CM | POA: Insufficient documentation

## 2016-02-26 DIAGNOSIS — I2510 Atherosclerotic heart disease of native coronary artery without angina pectoris: Secondary | ICD-10-CM | POA: Insufficient documentation

## 2016-02-26 DIAGNOSIS — I87323 Chronic venous hypertension (idiopathic) with inflammation of bilateral lower extremity: Secondary | ICD-10-CM | POA: Insufficient documentation

## 2016-02-26 DIAGNOSIS — I132 Hypertensive heart and chronic kidney disease with heart failure and with stage 5 chronic kidney disease, or end stage renal disease: Secondary | ICD-10-CM | POA: Insufficient documentation

## 2016-03-07 DIAGNOSIS — L97811 Non-pressure chronic ulcer of other part of right lower leg limited to breakdown of skin: Secondary | ICD-10-CM | POA: Diagnosis not present

## 2016-03-07 DIAGNOSIS — I2510 Atherosclerotic heart disease of native coronary artery without angina pectoris: Secondary | ICD-10-CM | POA: Diagnosis not present

## 2016-03-07 DIAGNOSIS — E1122 Type 2 diabetes mellitus with diabetic chronic kidney disease: Secondary | ICD-10-CM | POA: Diagnosis not present

## 2016-03-07 DIAGNOSIS — I509 Heart failure, unspecified: Secondary | ICD-10-CM | POA: Diagnosis not present

## 2016-03-07 DIAGNOSIS — I132 Hypertensive heart and chronic kidney disease with heart failure and with stage 5 chronic kidney disease, or end stage renal disease: Secondary | ICD-10-CM | POA: Diagnosis not present

## 2016-03-07 DIAGNOSIS — N186 End stage renal disease: Secondary | ICD-10-CM | POA: Diagnosis not present

## 2016-03-07 DIAGNOSIS — L97821 Non-pressure chronic ulcer of other part of left lower leg limited to breakdown of skin: Secondary | ICD-10-CM | POA: Diagnosis not present

## 2016-03-07 DIAGNOSIS — I87323 Chronic venous hypertension (idiopathic) with inflammation of bilateral lower extremity: Secondary | ICD-10-CM | POA: Diagnosis present

## 2016-03-07 DIAGNOSIS — E1140 Type 2 diabetes mellitus with diabetic neuropathy, unspecified: Secondary | ICD-10-CM | POA: Diagnosis not present

## 2016-03-14 DIAGNOSIS — I87323 Chronic venous hypertension (idiopathic) with inflammation of bilateral lower extremity: Secondary | ICD-10-CM | POA: Diagnosis not present

## 2016-03-21 DIAGNOSIS — I87323 Chronic venous hypertension (idiopathic) with inflammation of bilateral lower extremity: Secondary | ICD-10-CM | POA: Diagnosis not present

## 2016-03-22 LAB — GLUCOSE, CAPILLARY: GLUCOSE-CAPILLARY: 90 mg/dL (ref 65–99)

## 2016-03-29 ENCOUNTER — Encounter (HOSPITAL_BASED_OUTPATIENT_CLINIC_OR_DEPARTMENT_OTHER): Payer: Medicare Other | Attending: Internal Medicine

## 2016-03-29 DIAGNOSIS — L97821 Non-pressure chronic ulcer of other part of left lower leg limited to breakdown of skin: Secondary | ICD-10-CM | POA: Insufficient documentation

## 2016-03-29 DIAGNOSIS — I5022 Chronic systolic (congestive) heart failure: Secondary | ICD-10-CM | POA: Diagnosis not present

## 2016-03-29 DIAGNOSIS — L97511 Non-pressure chronic ulcer of other part of right foot limited to breakdown of skin: Secondary | ICD-10-CM | POA: Insufficient documentation

## 2016-03-29 DIAGNOSIS — I2510 Atherosclerotic heart disease of native coronary artery without angina pectoris: Secondary | ICD-10-CM | POA: Insufficient documentation

## 2016-03-29 DIAGNOSIS — I132 Hypertensive heart and chronic kidney disease with heart failure and with stage 5 chronic kidney disease, or end stage renal disease: Secondary | ICD-10-CM | POA: Diagnosis not present

## 2016-03-29 DIAGNOSIS — E1122 Type 2 diabetes mellitus with diabetic chronic kidney disease: Secondary | ICD-10-CM | POA: Diagnosis not present

## 2016-03-29 DIAGNOSIS — I87323 Chronic venous hypertension (idiopathic) with inflammation of bilateral lower extremity: Secondary | ICD-10-CM | POA: Diagnosis not present

## 2016-03-29 DIAGNOSIS — E1140 Type 2 diabetes mellitus with diabetic neuropathy, unspecified: Secondary | ICD-10-CM | POA: Diagnosis not present

## 2016-03-29 DIAGNOSIS — N186 End stage renal disease: Secondary | ICD-10-CM | POA: Insufficient documentation

## 2016-03-29 DIAGNOSIS — I4891 Unspecified atrial fibrillation: Secondary | ICD-10-CM | POA: Insufficient documentation

## 2016-04-05 DIAGNOSIS — I87323 Chronic venous hypertension (idiopathic) with inflammation of bilateral lower extremity: Secondary | ICD-10-CM | POA: Diagnosis not present

## 2016-04-14 ENCOUNTER — Emergency Department (HOSPITAL_COMMUNITY)
Admission: EM | Admit: 2016-04-14 | Discharge: 2016-04-14 | Disposition: A | Discharge status: Home / Self Care | Payer: Medicare Other | Attending: Emergency Medicine | Admitting: Emergency Medicine

## 2016-04-14 ENCOUNTER — Encounter (HOSPITAL_COMMUNITY): Payer: Self-pay | Admitting: Emergency Medicine

## 2016-04-14 DIAGNOSIS — Z23 Encounter for immunization: Secondary | ICD-10-CM | POA: Insufficient documentation

## 2016-04-14 DIAGNOSIS — Z791 Long term (current) use of non-steroidal anti-inflammatories (NSAID): Secondary | ICD-10-CM | POA: Diagnosis not present

## 2016-04-14 DIAGNOSIS — R600 Localized edema: Secondary | ICD-10-CM | POA: Diagnosis not present

## 2016-04-14 DIAGNOSIS — E1122 Type 2 diabetes mellitus with diabetic chronic kidney disease: Secondary | ICD-10-CM | POA: Insufficient documentation

## 2016-04-14 DIAGNOSIS — Y92810 Car as the place of occurrence of the external cause: Secondary | ICD-10-CM | POA: Insufficient documentation

## 2016-04-14 DIAGNOSIS — Y999 Unspecified external cause status: Secondary | ICD-10-CM | POA: Insufficient documentation

## 2016-04-14 DIAGNOSIS — Z79899 Other long term (current) drug therapy: Secondary | ICD-10-CM | POA: Diagnosis not present

## 2016-04-14 DIAGNOSIS — N183 Chronic kidney disease, stage 3 (moderate): Secondary | ICD-10-CM | POA: Diagnosis not present

## 2016-04-14 DIAGNOSIS — Z87891 Personal history of nicotine dependence: Secondary | ICD-10-CM | POA: Diagnosis not present

## 2016-04-14 DIAGNOSIS — I2510 Atherosclerotic heart disease of native coronary artery without angina pectoris: Secondary | ICD-10-CM | POA: Insufficient documentation

## 2016-04-14 DIAGNOSIS — Y9389 Activity, other specified: Secondary | ICD-10-CM | POA: Diagnosis not present

## 2016-04-14 DIAGNOSIS — S81812A Laceration without foreign body, left lower leg, initial encounter: Secondary | ICD-10-CM | POA: Diagnosis not present

## 2016-04-14 DIAGNOSIS — I130 Hypertensive heart and chronic kidney disease with heart failure and stage 1 through stage 4 chronic kidney disease, or unspecified chronic kidney disease: Secondary | ICD-10-CM | POA: Insufficient documentation

## 2016-04-14 DIAGNOSIS — W2209XA Striking against other stationary object, initial encounter: Secondary | ICD-10-CM | POA: Insufficient documentation

## 2016-04-14 DIAGNOSIS — I5032 Chronic diastolic (congestive) heart failure: Secondary | ICD-10-CM | POA: Insufficient documentation

## 2016-04-14 MED ORDER — AEROCHAMBER PLUS FLO-VU MEDIUM MISC
1.0000 | Freq: Once | Status: DC
  Filled 2016-04-14: qty 1

## 2016-04-14 MED ORDER — TETANUS-DIPHTH-ACELL PERTUSSIS 5-2.5-18.5 LF-MCG/0.5 IM SUSP
0.5000 mL | Freq: Once | INTRAMUSCULAR | Status: AC
Start: 2016-04-14 — End: 2016-04-14
  Administered 2016-04-14: 0.5 mL via INTRAMUSCULAR
  Filled 2016-04-14: qty 0.5

## 2016-04-14 MED ORDER — ALBUTEROL SULFATE HFA 108 (90 BASE) MCG/ACT IN AERS
1.0000 | INHALATION_SPRAY | RESPIRATORY_TRACT | Status: DC | PRN
  Filled 2016-04-14: qty 6.7

## 2016-04-14 NOTE — ED Provider Notes (Signed)
[facility] DEPT Provider Note   CSN: [id number] Arrival date & time: 04/14/16  0050  By signing my name below, I, [name], attest that this documentation has been prepared under the direction and in the presence of non-physician practitioner, Gloriann Loan, PA-C. Electronically Signed: Jeanell [name], Scribe. 04/14/2016. 1:30 AM.  History   Chief Complaint Chief Complaint  Patient presents with  . Laceration   The history is provided by the patient. No language interpreter was used.   HPI Comments: [name] is a 82 y.o. male who presents to the Emergency Department complaining of a LLE laceration that occurred about an hour ago. He states that his LLE was struck by a car door when attempting to get out of a car. He reports that he was able to ambulate following the incident. He states that someone usually come to his home to wrap his LLE, but it hasn't been done proper recently. He states that he is on eliquis. He reports that he is unsure of his tetanus shot. He denies any falls, chest pain, nausea, headache, or double vision. No numbness or weakness.      PCP: Philis Fendt, MD  Past Medical History:  Diagnosis Date  . Atrial fibrillation ([place])   . CHF (congestive heart failure) ([place])   . Chronic kidney disease   . Coronary artery disease   . Diabetes mellitus without complication ([place])   . Hypertension     Patient Active Problem List   Diagnosis Date Noted  . Acute CVA (cerebrovascular accident) ([place]) 12/09/2015  . Bradycardia 12/08/2015  . Fall 12/08/2015  . Anemia 12/08/2015  . Chronic kidney disease   . Atrial flutter ([place]) 08/29/2015  . Acute respiratory failure ([place]) 08/29/2015  . Acute on chronic diastolic heart failure ([place]) 08/28/2015  . Hypoxia 08/28/2015  . Atrial fibrillation ([place])   . Diabetes mellitus without complication ([place])   . Diabetes mellitus with complication ([place])   . Noncompliance with medications 08/03/2015  . Chronic  diastolic CHF (congestive heart failure) ([place]) 07/31/2015  . Cellulitis of left lower extremity 07/31/2015  . Anemia of chronic renal failure, stage 3 (moderate) 07/31/2015  . Uncontrolled diabetes mellitus with diabetic nephropathy, with long-term current use of insulin ([place]) 07/31/2015  . Accelerated hypertension 07/31/2015  . Thrombocytopenia ([place]) 07/31/2015  . Acute renal failure superimposed on stage 3 chronic kidney disease ([place]) 11/29/2014  . Chronic acquired lymphedema 06/25/2011    Past Surgical History:  Procedure Laterality Date  . COLONOSCOPY  06/21/2012   Procedure: COLONOSCOPY;  Surgeon: Juanita Craver, MD;  Location: [facility] ENDOSCOPY;  Service: Endoscopy;  Laterality: N/A;  . COLONOSCOPY Left 02/13/2014   Procedure: COLONOSCOPY;  Surgeon: Juanita Craver, MD;  Location: [facility] ENDOSCOPY;  Service: Endoscopy;  Laterality: Left;  . ESOPHAGOGASTRODUODENOSCOPY  06/19/2012   Procedure: ESOPHAGOGASTRODUODENOSCOPY (EGD);  Surgeon: Beryle Beams, MD;  Location: [facility] ENDOSCOPY;  Service: Endoscopy;  Laterality: N/A;  . LAPAROSCOPIC RIGHT COLECTOMY Right 02/15/2014   Procedure: LAPAROSCOPIC ASISSTED RIGHT  COLECTOMY, POSSIBLE OPEN;  Surgeon: Rolm Bookbinder, MD;  Location: [facility];  Service: General;  Laterality: Right;  . LEG SURGERY     Left leg surgery. broken leg     Home Medications    Prior to Admission medications   Medication Sig Start Date End Date Taking? Authorizing Provider  albuterol (PROVENTIL) (2.5 MG/3ML) 0.083% nebulizer solution Take 3 mLs (2.5 mg total) by nebulization every 4 (four) hours as needed for wheezing or shortness of breath. 09/02/15   Ripudeep  Krystal Eaton, MD  amLODipine (NORVASC) 10 MG tablet Take 1 tablet (10 mg total) by mouth daily. 12/12/15   Ripudeep Krystal Eaton, MD  apixaban (ELIQUIS) 2.5 MG TABS tablet Take 1 tablet (2.5 mg total) by mouth 2 (two) times daily. 12/12/15   Ripudeep Krystal Eaton, MD  atorvastatin (LIPITOR) 20 MG tablet Take 1 tablet (20 mg total) by mouth daily at 6  PM. 12/12/15   Ripudeep K Rai, MD  furosemide (LASIX) 40 MG tablet Take 1 tablet (40 mg total) by mouth daily. 09/04/15   Ripudeep Krystal Eaton, MD  hydrALAZINE (APRESOLINE) 50 MG tablet Take 1 tablet (50 mg total) by mouth every 8 (eight) hours. 12/12/15   Ripudeep Krystal Eaton, MD  insulin aspart (NOVOLOG) 100 UNIT/ML injection Inject 0-9 Units into the skin 3 (three) times daily with meals. Sliding scale CBG 70 - 120: 0 units CBG 121 - 150: 1 unit,  CBG 151 - 200: 2 units,  CBG 201 - 250: 3 units,  CBG 251 - 300: 5 units,  CBG 301 - 350: 7 units,  CBG 351 - 400: 9 units   CBG > 400: 9 units and notify your MD 12/12/15   Ripudeep Krystal Eaton, MD  insulin glargine (LANTUS) 100 UNIT/ML injection Inject 0.1 mLs (10 Units total) into the skin at bedtime. 12/12/15   Ripudeep Krystal Eaton, MD  isosorbide mononitrate (IMDUR) 60 MG 24 hr tablet Take 1 tablet (60 mg total) by mouth daily. 12/12/15   Ripudeep Krystal Eaton, MD  oxyCODONE-acetaminophen (PERCOCET/ROXICET) 5-325 MG tablet Take 1 tablet by mouth every 6 (six) hours as needed for moderate pain or severe pain. 12/12/15   Ripudeep Krystal Eaton, MD  polyethylene glycol [facility]) packet Take 17 g by mouth daily as needed. 12/12/15   Ripudeep Krystal Eaton, MD  senna-docusate (SENOKOT-S) 8.6-50 MG tablet Take 1 tablet by mouth at bedtime as needed for moderate constipation. 12/12/15   Ripudeep Krystal Eaton, MD    Family History Family History  Problem Relation Age of Onset  . Brain cancer Mother   . Diabetes type II Sister   . Narcolepsy Paternal Uncle     Social History Social History  Substance Use Topics  . Smoking status: Former Smoker    Quit date: 08/26/1998  . Smokeless tobacco: Never Used  . Alcohol use No     Comment: in the past     Allergies   Review of patient's allergies indicates no known allergies.   Review of Systems Review of Systems  Cardiovascular: Negative for chest pain.  Gastrointestinal: Negative for nausea.  Skin: Positive for wound.  Neurological: Negative for headaches.       Physical Exam Updated Vital Signs BP (!) 186/50 (BP Location: Left Arm)   Pulse 68   Temp 98 F (36.7 C) (Oral)   Resp 20   Ht 5\' 11"  (1.803 m)   Wt 240 lb (108.9 kg)   SpO2 98%   BMI 33.47 kg/m   Physical Exam  Constitutional: He is oriented to person, place, and time. He appears well-developed and well-nourished.  Non-toxic appearance. He does not have a sickly appearance. He does not appear ill.  HENT:  Head: Normocephalic and atraumatic.  Mouth/Throat: Oropharynx is clear and moist.  Eyes: Conjunctivae are normal. Pupils are equal, round, and reactive to light.  Neck: Normal range of motion. Neck supple.  Cardiovascular: Normal rate and regular rhythm.   Bilateral lower extremity edema.   Pulmonary/Chest: Effort normal and breath sounds  normal. No accessory muscle usage or stridor. No respiratory distress. He has no wheezes. He has no rhonchi. He has no rales.  Abdominal: Soft. Bowel sounds are normal. He exhibits no distension. There is no tenderness.  Musculoskeletal: Normal range of motion.  Lymphadenopathy:    He has no cervical adenopathy.  Neurological: He is alert and oriented to person, place, and time.  Speech clear without dysarthria. Strength and sensation intact.   Skin: Skin is warm and dry.  Chronic venous insufficiency of LLE.  Superficial <1 cm laceration to lateral LLE.  Hemostatic.  Psychiatric: He has a normal mood and affect. His behavior is normal.     ED Treatments / Results  DIAGNOSTIC STUDIES: Oxygen Saturation is 98% on RA, normal by my interpretation.    COORDINATION OF CARE: 1:34 AM- Pt advised of plan for treatment which includes medication and pt agrees.  Labs (all labs ordered are listed, but only abnormal results are displayed) Labs Reviewed - No data to display  EKG  EKG Interpretation None       Radiology No results found.  Procedures Procedures (including critical care time)  Medications Ordered in  ED Medications  Tdap (BOOSTRIX) injection 0.5 mL (0.5 mLs Intramuscular Given 04/14/16 0309)     Initial Impression / Assessment and Plan / ED Course  I have reviewed the triage vital signs and the nursing notes.  Pertinent labs & imaging results that were available during my care of the patient were reviewed by me and considered in my medical decision making (see chart for details).  Clinical Course   Patient with superficial laceration to LLE.  Hemostatic.  Steri strips applied.  Tetanus up dated.  Follow up wound care center.  Return precautions discussed.  Stable for discharge.   Final Clinical Impressions(s) / ED Diagnoses   Final diagnoses:  Leg laceration, left, initial encounter    New Prescriptions New Prescriptions   No medications on file   I personally performed the services described in this documentation, which was scribed in my presence. The recorded information has been reviewed and is accurate.     Gloriann Loan, PA-C 04/14/16 0330    Leo Grosser, MD 04/15/16 [phone]

## 2016-04-14 NOTE — ED Notes (Addendum)
[facility] called for transportation. Pt changed and ready for transport.

## 2016-04-14 NOTE — ED Triage Notes (Signed)
Pt transported from home by EMS for c/o laceration to LLE, pt states he struck leg on door @1  hour ago. Bleeding controlled by bandage.

## 2016-04-14 NOTE — ED Notes (Signed)
Patient is unable to walk without his walker and friend/ride Coralyn Mark is not picking up the phone. Patient is unsure of rides phone number.

## 2016-04-14 NOTE — ED Notes (Signed)
Pt states a friend was dropping him off after playing bingo tonight and when he got out of the car he scraped his left leg on the frame of the car causing a laceration. Pt is unsure when his last tetanus shot was. Pt states "a girl used to come help him around the house, but she hasn't come in a few days". Pt is unsure when or how he can get help around the house.

## 2016-04-15 ENCOUNTER — Telehealth: Payer: Self-pay | Admitting: General Practice

## 2016-04-16 ENCOUNTER — Telehealth: Payer: Self-pay | Admitting: *Deleted

## 2016-04-17 ENCOUNTER — Telehealth: Payer: Self-pay | Admitting: *Deleted

## 2016-05-01 ENCOUNTER — Emergency Department (HOSPITAL_COMMUNITY)
Admission: EM | Admit: 2016-05-01 | Discharge: 2016-05-01 | Discharge status: Against Medical Advice | Payer: Medicare Other

## 2016-05-06 ENCOUNTER — Encounter (HOSPITAL_COMMUNITY): Payer: Self-pay | Admitting: *Deleted

## 2016-05-06 ENCOUNTER — Emergency Department (HOSPITAL_COMMUNITY)
Admission: EM | Admit: 2016-05-06 | Discharge: 2016-05-06 | Disposition: A | Discharge status: Home / Self Care | Payer: Medicare Other | Attending: Emergency Medicine | Admitting: Emergency Medicine

## 2016-05-06 DIAGNOSIS — I130 Hypertensive heart and chronic kidney disease with heart failure and stage 1 through stage 4 chronic kidney disease, or unspecified chronic kidney disease: Secondary | ICD-10-CM | POA: Diagnosis not present

## 2016-05-06 DIAGNOSIS — Z87891 Personal history of nicotine dependence: Secondary | ICD-10-CM | POA: Diagnosis not present

## 2016-05-06 DIAGNOSIS — R600 Localized edema: Secondary | ICD-10-CM | POA: Insufficient documentation

## 2016-05-06 DIAGNOSIS — I2510 Atherosclerotic heart disease of native coronary artery without angina pectoris: Secondary | ICD-10-CM | POA: Diagnosis not present

## 2016-05-06 DIAGNOSIS — Z79899 Other long term (current) drug therapy: Secondary | ICD-10-CM | POA: Insufficient documentation

## 2016-05-06 DIAGNOSIS — Z794 Long term (current) use of insulin: Secondary | ICD-10-CM | POA: Insufficient documentation

## 2016-05-06 DIAGNOSIS — E1122 Type 2 diabetes mellitus with diabetic chronic kidney disease: Secondary | ICD-10-CM | POA: Insufficient documentation

## 2016-05-06 DIAGNOSIS — N189 Chronic kidney disease, unspecified: Secondary | ICD-10-CM | POA: Insufficient documentation

## 2016-05-06 DIAGNOSIS — I509 Heart failure, unspecified: Secondary | ICD-10-CM | POA: Insufficient documentation

## 2016-05-06 DIAGNOSIS — R609 Edema, unspecified: Secondary | ICD-10-CM

## 2016-05-06 DIAGNOSIS — M7989 Other specified soft tissue disorders: Secondary | ICD-10-CM | POA: Diagnosis present

## 2016-05-06 LAB — COMPREHENSIVE METABOLIC PANEL
ALT: 7 U/L — ABNORMAL LOW (ref 17–63)
AST: 8 U/L — ABNORMAL LOW (ref 15–41)
Albumin: 4.1 g/dL (ref 3.5–5.0)
Alkaline Phosphatase: 62 U/L (ref 38–126)
Anion gap: 6 (ref 5–15)
BUN: 28 mg/dL — ABNORMAL HIGH (ref 6–20)
CO2: 25 mmol/L (ref 22–32)
Calcium: 8.6 mg/dL — ABNORMAL LOW (ref 8.9–10.3)
Chloride: 109 mmol/L (ref 101–111)
Creatinine, Ser: 2.46 mg/dL — ABNORMAL HIGH (ref 0.61–1.24)
GFR calc Af Amer: 27 mL/min — ABNORMAL LOW (ref >60)
GFR calc non Af Amer: 23 mL/min — ABNORMAL LOW (ref >60)
Glucose, Bld: 107 mg/dL — ABNORMAL HIGH (ref 65–99)
Potassium: 3.8 mmol/L (ref 3.5–5.1)
Sodium: 140 mmol/L (ref 135–145)
Total Bilirubin: 0.5 mg/dL (ref 0.3–1.2)
Total Protein: 8 g/dL (ref 6.5–8.1)

## 2016-05-06 LAB — CBC
HCT: 27.9 % — ABNORMAL LOW (ref 39.0–52.0)
Hemoglobin: 8.9 g/dL — ABNORMAL LOW (ref 13.0–17.0)
MCH: 25.3 pg — ABNORMAL LOW (ref 26.0–34.0)
MCHC: 31.9 g/dL (ref 30.0–36.0)
MCV: 79.3 fL (ref 78.0–100.0)
Platelets: 171 10*3/uL (ref 150–400)
RBC: 3.52 MIL/uL — ABNORMAL LOW (ref 4.22–5.81)
RDW: 16.6 % — ABNORMAL HIGH (ref 11.5–15.5)
WBC: 7.7 10*3/uL (ref 4.0–10.5)

## 2016-05-06 MED ORDER — FUROSEMIDE 40 MG PO TABS
40.0000 mg | ORAL_TABLET | Freq: Once | ORAL | Status: AC
  Administered 2016-05-06: 40 mg via ORAL
  Filled 2016-05-06: qty 1

## 2016-05-06 MED ORDER — HYDROMORPHONE HCL 2 MG/ML IJ SOLN
2.0000 mg | Freq: Once | INTRAMUSCULAR | Status: AC
  Administered 2016-05-06: 2 mg via INTRAMUSCULAR
  Filled 2016-05-06: qty 1

## 2016-05-06 NOTE — ED Provider Notes (Signed)
[facility] DEPT Provider Note   CSN: [id number] Arrival date & time: 05/06/16  1521     History   Chief Complaint Chief Complaint  Patient presents with  . Foot Swelling  . Weakness    HPI [name] is a 82 y.o. male.  HPI  Pt presenting with c/o bilateral lower exremity swelling.  He states his legs have been swollen for years, but over the past several days have become painful and more swollen.  Both legs are equally swollen.  He has more pain with moving his legs.  He does not keep the legs elevated much.  He sees the wound center but has not been able to get in to see them on an urgent basis for the past couple of weeks.  He takes lasix.  Denies missing doses.  There are no other associated systemic symptoms, there are no other alleviating or modifying factors.   Past Medical History:  Diagnosis Date  . Atrial fibrillation ([place])   . CHF (congestive heart failure) ([place])   . Chronic kidney disease   . Coronary artery disease   . Diabetes mellitus without complication ([place])   . Hypertension     Patient Active Problem List   Diagnosis Date Noted  . Acute CVA (cerebrovascular accident) ([place]) 12/09/2015  . Bradycardia 12/08/2015  . Fall 12/08/2015  . Anemia 12/08/2015  . Chronic kidney disease   . Atrial flutter ([place]) 08/29/2015  . Acute respiratory failure ([place]) 08/29/2015  . Acute on chronic diastolic heart failure ([place]) 08/28/2015  . Hypoxia 08/28/2015  . Atrial fibrillation ([place])   . Diabetes mellitus without complication ([place])   . Diabetes mellitus with complication ([place])   . Noncompliance with medications 08/03/2015  . Chronic diastolic CHF (congestive heart failure) ([place]) 07/31/2015  . Cellulitis of left lower extremity 07/31/2015  . Anemia of chronic renal failure, stage 3 (moderate) 07/31/2015  . Uncontrolled diabetes mellitus with diabetic nephropathy, with long-term current use of insulin ([place]) 07/31/2015  . Accelerated hypertension 07/31/2015    . Thrombocytopenia ([place]) 07/31/2015  . Acute renal failure superimposed on stage 3 chronic kidney disease ([place]) 11/29/2014  . Chronic acquired lymphedema 06/25/2011    Past Surgical History:  Procedure Laterality Date  . COLONOSCOPY  06/21/2012   Procedure: COLONOSCOPY;  Surgeon: Juanita Craver, MD;  Location: [facility] ENDOSCOPY;  Service: Endoscopy;  Laterality: N/A;  . COLONOSCOPY Left 02/13/2014   Procedure: COLONOSCOPY;  Surgeon: Juanita Craver, MD;  Location: [facility] ENDOSCOPY;  Service: Endoscopy;  Laterality: Left;  . ESOPHAGOGASTRODUODENOSCOPY  06/19/2012   Procedure: ESOPHAGOGASTRODUODENOSCOPY (EGD);  Surgeon: Beryle Beams, MD;  Location: [facility] ENDOSCOPY;  Service: Endoscopy;  Laterality: N/A;  . LAPAROSCOPIC RIGHT COLECTOMY Right 02/15/2014   Procedure: LAPAROSCOPIC ASISSTED RIGHT  COLECTOMY, POSSIBLE OPEN;  Surgeon: Rolm Bookbinder, MD;  Location: [facility];  Service: General;  Laterality: Right;  . LEG SURGERY     Left leg surgery. broken leg       Home Medications    Prior to Admission medications   Medication Sig Start Date End Date Taking? Authorizing Provider  albuterol (PROVENTIL) (2.5 MG/3ML) 0.083% nebulizer solution Take 3 mLs (2.5 mg total) by nebulization every 4 (four) hours as needed for wheezing or shortness of breath. 09/02/15   Ripudeep Krystal Eaton, MD  amLODipine (NORVASC) 10 MG tablet Take 1 tablet (10 mg total) by mouth daily. 12/12/15   Ripudeep Krystal Eaton, MD  apixaban (ELIQUIS) 2.5 MG TABS tablet Take 1 tablet (2.5 mg total) by mouth 2 (  two) times daily. 12/12/15   Ripudeep Krystal Eaton, MD  atorvastatin (LIPITOR) 20 MG tablet Take 1 tablet (20 mg total) by mouth daily at 6 PM. 12/12/15   Ripudeep K Rai, MD  furosemide (LASIX) 40 MG tablet Take 1 tablet (40 mg total) by mouth daily. 09/04/15   Ripudeep Krystal Eaton, MD  hydrALAZINE (APRESOLINE) 50 MG tablet Take 1 tablet (50 mg total) by mouth every 8 (eight) hours. 12/12/15   Ripudeep Krystal Eaton, MD  insulin aspart (NOVOLOG) 100 UNIT/ML injection Inject 0-9  Units into the skin 3 (three) times daily with meals. Sliding scale CBG 70 - 120: 0 units CBG 121 - 150: 1 unit,  CBG 151 - 200: 2 units,  CBG 201 - 250: 3 units,  CBG 251 - 300: 5 units,  CBG 301 - 350: 7 units,  CBG 351 - 400: 9 units   CBG > 400: 9 units and notify your MD 12/12/15   Ripudeep Krystal Eaton, MD  insulin glargine (LANTUS) 100 UNIT/ML injection Inject 0.1 mLs (10 Units total) into the skin at bedtime. 12/12/15   Ripudeep Krystal Eaton, MD  isosorbide mononitrate (IMDUR) 60 MG 24 hr tablet Take 1 tablet (60 mg total) by mouth daily. 12/12/15   Ripudeep Krystal Eaton, MD  oxyCODONE-acetaminophen (PERCOCET/ROXICET) 5-325 MG tablet Take 1 tablet by mouth every 6 (six) hours as needed for moderate pain or severe pain. 12/12/15   Ripudeep Krystal Eaton, MD  polyethylene glycol [facility]) packet Take 17 g by mouth daily as needed. 12/12/15   Ripudeep Krystal Eaton, MD  senna-docusate (SENOKOT-S) 8.6-50 MG tablet Take 1 tablet by mouth at bedtime as needed for moderate constipation. 12/12/15   Ripudeep Krystal Eaton, MD    Family History Family History  Problem Relation Age of Onset  . Brain cancer Mother   . Diabetes type II Sister   . Narcolepsy Paternal Uncle     Social History Social History  Substance Use Topics  . Smoking status: Former Smoker    Quit date: 08/26/1998  . Smokeless tobacco: Never Used  . Alcohol use No     Comment: in the past     Allergies   Review of patient's allergies indicates no known allergies.   Review of Systems Review of Systems ROS reviewed and all otherwise negative except for mentioned in HPI  Physical Exam Updated Vital Signs BP (!) 173/54 (BP Location: Right Arm)   Pulse 68   Temp 98.4 F (36.9 C) (Oral)   Resp 18   SpO2 97%  Vitals reviewed Physical Exam Physical Examination: General appearance - alert, well appearing, and in no distress Mental status - alert, oriented to person, place, and time Eyes - pupils equal and reactive, extraocular eye movements intact Chest - clear to  auscultation, no wheezes, rales or rhonchi, symmetric air entry Heart - normal rate, regular rhythm, normal S1, S2, no murmurs, rubs, clicks or gallops Abdomen - soft, nontender, nondistended, no masses or organomegaly Neurological - alert, oriented, normal speechnoted Extremities - peripheral pulses normal, chronic venous stasis changes with 3+ edema, some skin breakdown with clear fluid leaking- symmetric Skin - venous stasis changes as noted above, no areas of fluctuance or prurulence, no erythema streaking  ED Treatments / Results  Labs (all labs ordered are listed, but only abnormal results are displayed) Labs Reviewed  CBC - Abnormal; Notable for the following:       Result Value   RBC 3.52 (*)    Hemoglobin 8.9 (*)  HCT 27.9 (*)    MCH 25.3 (*)    RDW 16.6 (*)    All other components within normal limits  COMPREHENSIVE METABOLIC PANEL - Abnormal; Notable for the following:    Glucose, Bld 107 (*)    BUN 28 (*)    Creatinine, Ser 2.46 (*)    Calcium 8.6 (*)    AST 8 (*)    ALT 7 (*)    GFR calc non Af Amer 23 (*)    GFR calc Af Amer 27 (*)    All other components within normal limits    EKG  EKG Interpretation None       Radiology No results found.  Procedures Procedures (including critical care time)  Medications Ordered in ED Medications  HYDROmorphone (DILAUDID) injection 2 mg (2 mg Intramuscular Given 05/06/16 2159)  furosemide (LASIX) tablet 40 mg (40 mg Oral Given 05/06/16 2159)     Initial Impression / Assessment and Plan / ED Course  I have reviewed the triage vital signs and the nursing notes.  Pertinent labs & imaging results that were available during my care of the patient were reviewed by me and considered in my medical decision making (see chart for details).  Clinical Course    Pt presenting with c/o bilateral lower extremity swelling which is chronic but worse than usual over the past several days.  He has been seen in the past by wound  care for similar issues- he has not been able to get in for an urgent appointment with wound care.  Doubt cellulitis or superinfection of wounds- no fever, no elevated WBC, no streaking of erythema.  Labs are reassuring as well- his hemoglobin is at his baseline, renal failure at baseline as well.  D/w patient and family to increase his lasix dose over the next couple of days, f/u closely with PMD for recheck of labs and further advice on medication changes.  No antibiotics for now.  Pt requesting an injection for pain which was provided.  Family will call to schedule an appointment for wound care.  I have also entered an order for advance home care/home health as he will likely need assistance at home.  Discharged with strict return precautions.  Pt agreeable with plan.  Final Clinical Impressions(s) / ED Diagnoses   Final diagnoses:  Peripheral edema    New Prescriptions Discharge Medication List as of 05/06/2016  8:55 PM       Alfonzo Beers, MD 05/10/16 1251

## 2016-05-06 NOTE — Discharge Instructions (Signed)
Return to the ED with any concerns including increased redness of legs, redness streaking up legs, fever/chills, difficulty breathing, chest pain, decreased level of alertness/lethargy, or any other alarming symptoms  You should increase your dose of lasix to twice daily for the next 2-3 days- be sure to arrange for close followup with your primary doctor

## 2016-05-06 NOTE — ED Triage Notes (Signed)
Pt complains of bilateral foot swelling and weakness for the past 2-3 weeks. Pt has sutures to left foot that need to be removed.

## 2016-05-09 ENCOUNTER — Encounter (HOSPITAL_BASED_OUTPATIENT_CLINIC_OR_DEPARTMENT_OTHER): Payer: Medicare Other | Attending: Internal Medicine

## 2016-05-09 DIAGNOSIS — I2510 Atherosclerotic heart disease of native coronary artery without angina pectoris: Secondary | ICD-10-CM | POA: Insufficient documentation

## 2016-05-09 DIAGNOSIS — N186 End stage renal disease: Secondary | ICD-10-CM | POA: Diagnosis not present

## 2016-05-09 DIAGNOSIS — I132 Hypertensive heart and chronic kidney disease with heart failure and with stage 5 chronic kidney disease, or end stage renal disease: Secondary | ICD-10-CM | POA: Insufficient documentation

## 2016-05-09 DIAGNOSIS — Z79899 Other long term (current) drug therapy: Secondary | ICD-10-CM | POA: Diagnosis not present

## 2016-05-09 DIAGNOSIS — E1140 Type 2 diabetes mellitus with diabetic neuropathy, unspecified: Secondary | ICD-10-CM | POA: Insufficient documentation

## 2016-05-09 DIAGNOSIS — E1122 Type 2 diabetes mellitus with diabetic chronic kidney disease: Secondary | ICD-10-CM | POA: Diagnosis not present

## 2016-05-09 DIAGNOSIS — I5022 Chronic systolic (congestive) heart failure: Secondary | ICD-10-CM | POA: Insufficient documentation

## 2016-05-09 DIAGNOSIS — I87333 Chronic venous hypertension (idiopathic) with ulcer and inflammation of bilateral lower extremity: Secondary | ICD-10-CM | POA: Diagnosis not present

## 2016-05-09 DIAGNOSIS — L97811 Non-pressure chronic ulcer of other part of right lower leg limited to breakdown of skin: Secondary | ICD-10-CM | POA: Insufficient documentation

## 2016-05-09 DIAGNOSIS — L97821 Non-pressure chronic ulcer of other part of left lower leg limited to breakdown of skin: Secondary | ICD-10-CM | POA: Insufficient documentation

## 2016-05-09 DIAGNOSIS — I4891 Unspecified atrial fibrillation: Secondary | ICD-10-CM | POA: Diagnosis not present

## 2016-05-17 DIAGNOSIS — I87333 Chronic venous hypertension (idiopathic) with ulcer and inflammation of bilateral lower extremity: Secondary | ICD-10-CM | POA: Diagnosis not present

## 2016-06-01 IMAGING — CR DG ABD PORTABLE 1V
1 series · 1 of 1 positions shown · non-contrast
Comparison: Abdominal radiograph 02/19/2014.

CLINICAL DATA: Abdominal pain.

EXAM:
PORTABLE ABDOMEN - 1 VIEW

[AP]
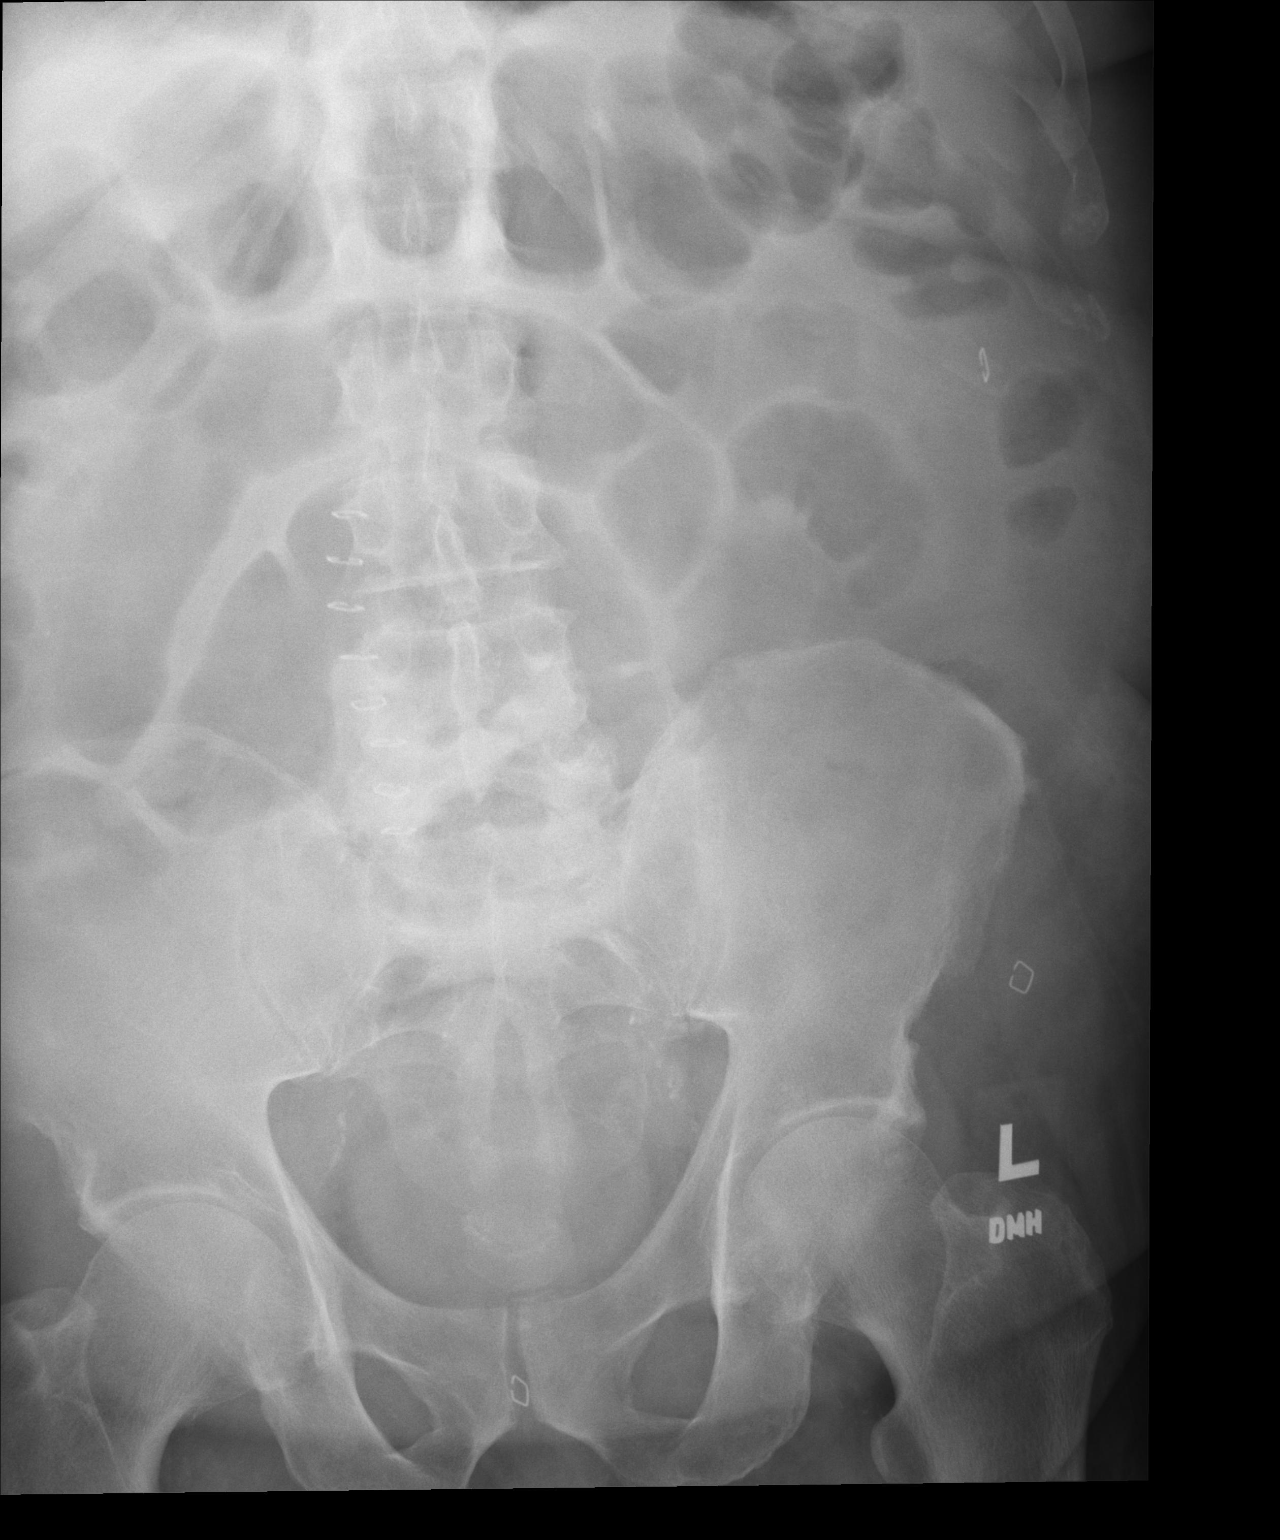

[1 of 1 positions shown; findings below may reference images not displayed]

FINDINGS: Gas is seen throughout the colon to at least the level of the
descending colon. Several mildly dilated loops of gas-filled small
bowel are seen projecting over the central abdomen measuring up to
4.1 cm in diameter. No gross evidence of pneumoperitoneum. Midline
surgical staples are noted.
IMPRESSION: 1. Bowel gas pattern is again most suggestive of a postoperative
ileus, as above.

## 2016-07-18 ENCOUNTER — Encounter (HOSPITAL_COMMUNITY): Payer: Self-pay | Admitting: *Deleted

## 2016-07-18 ENCOUNTER — Emergency Department (HOSPITAL_COMMUNITY)
Admission: EM | Admit: 2016-07-18 | Discharge: 2016-07-18 | Disposition: A | Discharge status: Home / Self Care | Payer: Medicare Other | Attending: Emergency Medicine | Admitting: Emergency Medicine

## 2016-07-18 DIAGNOSIS — Z8673 Personal history of transient ischemic attack (TIA), and cerebral infarction without residual deficits: Secondary | ICD-10-CM | POA: Diagnosis not present

## 2016-07-18 DIAGNOSIS — Z87891 Personal history of nicotine dependence: Secondary | ICD-10-CM | POA: Diagnosis not present

## 2016-07-18 DIAGNOSIS — Z79899 Other long term (current) drug therapy: Secondary | ICD-10-CM | POA: Insufficient documentation

## 2016-07-18 DIAGNOSIS — I5033 Acute on chronic diastolic (congestive) heart failure: Secondary | ICD-10-CM | POA: Diagnosis not present

## 2016-07-18 DIAGNOSIS — Z794 Long term (current) use of insulin: Secondary | ICD-10-CM | POA: Diagnosis not present

## 2016-07-18 DIAGNOSIS — I2510 Atherosclerotic heart disease of native coronary artery without angina pectoris: Secondary | ICD-10-CM | POA: Insufficient documentation

## 2016-07-18 DIAGNOSIS — L03116 Cellulitis of left lower limb: Secondary | ICD-10-CM | POA: Diagnosis not present

## 2016-07-18 DIAGNOSIS — Z7901 Long term (current) use of anticoagulants: Secondary | ICD-10-CM | POA: Insufficient documentation

## 2016-07-18 DIAGNOSIS — I130 Hypertensive heart and chronic kidney disease with heart failure and stage 1 through stage 4 chronic kidney disease, or unspecified chronic kidney disease: Secondary | ICD-10-CM | POA: Diagnosis not present

## 2016-07-18 DIAGNOSIS — E1122 Type 2 diabetes mellitus with diabetic chronic kidney disease: Secondary | ICD-10-CM | POA: Insufficient documentation

## 2016-07-18 DIAGNOSIS — M79604 Pain in right leg: Secondary | ICD-10-CM

## 2016-07-18 DIAGNOSIS — L03119 Cellulitis of unspecified part of limb: Secondary | ICD-10-CM

## 2016-07-18 DIAGNOSIS — M7989 Other specified soft tissue disorders: Secondary | ICD-10-CM | POA: Diagnosis present

## 2016-07-18 DIAGNOSIS — N183 Chronic kidney disease, stage 3 (moderate): Secondary | ICD-10-CM | POA: Diagnosis not present

## 2016-07-18 DIAGNOSIS — L03115 Cellulitis of right lower limb: Secondary | ICD-10-CM | POA: Diagnosis not present

## 2016-07-18 DIAGNOSIS — M79605 Pain in left leg: Secondary | ICD-10-CM

## 2016-07-18 LAB — CBC WITH DIFFERENTIAL/PLATELET
BASOS PCT: 0 %
Basophils Absolute: 0 10*3/uL (ref 0.0–0.1)
EOS PCT: 2 %
Eosinophils Absolute: 0.1 10*3/uL (ref 0.0–0.7)
HEMATOCRIT: 27 % — AB (ref 39.0–52.0)
HEMOGLOBIN: 8.5 g/dL — AB (ref 13.0–17.0)
LYMPHS PCT: 28 %
Lymphs Abs: 2 10*3/uL (ref 0.7–4.0)
MCH: 24.8 pg — AB (ref 26.0–34.0)
MCHC: 31.5 g/dL (ref 30.0–36.0)
MCV: 78.7 fL (ref 78.0–100.0)
MONOS PCT: 24 %
Monocytes Absolute: 1.7 10*3/uL — ABNORMAL HIGH (ref 0.1–1.0)
Neutro Abs: 3.4 10*3/uL (ref 1.7–7.7)
Neutrophils Relative %: 46 %
Platelets: 180 10*3/uL (ref 150–400)
RBC: 3.43 MIL/uL — AB (ref 4.22–5.81)
RDW: 16.8 % — ABNORMAL HIGH (ref 11.5–15.5)
WBC: 7.2 10*3/uL (ref 4.0–10.5)

## 2016-07-18 LAB — BASIC METABOLIC PANEL
Anion gap: 5 (ref 5–15)
BUN: 40 mg/dL — ABNORMAL HIGH (ref 6–20)
CALCIUM: 7.9 mg/dL — AB (ref 8.9–10.3)
CHLORIDE: 109 mmol/L (ref 101–111)
CO2: 23 mmol/L (ref 22–32)
Creatinine, Ser: 2.37 mg/dL — ABNORMAL HIGH (ref 0.61–1.24)
GFR calc non Af Amer: 24 mL/min — ABNORMAL LOW (ref >60)
GFR, EST AFRICAN AMERICAN: 28 mL/min — AB (ref >60)
GLUCOSE: 90 mg/dL (ref 65–99)
Potassium: 4.1 mmol/L (ref 3.5–5.1)
Sodium: 137 mmol/L (ref 135–145)

## 2016-07-18 LAB — I-STAT CG4 LACTIC ACID, ED: Lactic Acid, Venous: 0.43 mmol/L — ABNORMAL LOW (ref 0.5–1.9)

## 2016-07-18 LAB — BRAIN NATRIURETIC PEPTIDE: B Natriuretic Peptide: 242.1 pg/mL — ABNORMAL HIGH (ref 0.0–100.0)

## 2016-07-18 MED ORDER — SODIUM CHLORIDE 0.9 % IV SOLN
INTRAVENOUS | Status: DC
  Administered 2016-07-18: 13:00:00 via INTRAVENOUS

## 2016-07-18 MED ORDER — AMOXICILLIN-POT CLAVULANATE 875-125 MG PO TABS
1.0000 | ORAL_TABLET | Freq: Two times a day (BID) | ORAL | Status: DC
  Administered 2016-07-18: 1 via ORAL
  Filled 2016-07-18: qty 1

## 2016-07-18 MED ORDER — AMOXICILLIN-POT CLAVULANATE 875-125 MG PO TABS: 1.0000 | ORAL_TABLET | Freq: Two times a day (BID) | ORAL | 0 refills | Status: DC

## 2016-07-18 MED ORDER — TRAMADOL HCL 50 MG PO TABS
50.0000 mg | ORAL_TABLET | Freq: Once | ORAL | Status: AC
  Administered 2016-07-18: 50 mg via ORAL
  Filled 2016-07-18: qty 1

## 2016-07-18 NOTE — ED Notes (Signed)
Patient d/c'd with instructions.  F/U reviewed.  Patient verbalized understanding.

## 2016-07-18 NOTE — Discharge Instructions (Signed)
Elevate your legs above your heart, as much as possible.  Keep the wounds on your legs clean with soap and water once or twice a day.  Apply heat to your lower legs several times each day to improve blood flow and healing.    We are going to have home health services evaluate you in your home for additional medical needs and care.   It is important to follow-up with the wound care clinic for evaluation and treatment of your lower leg problem. Call them on Monday morning for an appointment.

## 2016-07-18 NOTE — ED Notes (Signed)
This Agricultural consultant was asked to speak to the Pt because he was refusing to be transferred home by [facility].  Pt reports that he wants to be admitted. Pt informed by this Probation officer and EDP that he does not meet criteria.  Pt reporting that he is unhappy with his home care, has no food at home, and provided the phone number of a care giver.  This Probation officer spoke w/ Dellis Filbert [phone], who reports that he helps the Pt out.  Sts that Tesha, who the Pt refers to as his caregiver, is not a caregiver and, also, "helps him out of the goodness of her heart."  Further, Dellis Filbert sts that he will be bringing the Pt a plate of food later.  Sts that he gives the Pt $100 a week for food and the Pt "spends $70 on lottery tickets."  Sts he has set him up an appointment w/ his PCP on Tuesday and will call the [facility] tomorrow or Monday.  Sts he is going to try to get the Pt placed in a facility, but the Pt has been discharged by several d/t disruptive behaviors.    Pt's legs wrapped by Colletta Maryland RN and remains agreeable to transport.

## 2016-07-18 NOTE — ED Notes (Signed)
Bed: [id number] Expected date:  Expected time:  Means of arrival:  Comments: EMS- 82yo M, BLE edema and "oozing"/HTN

## 2016-07-18 NOTE — ED Notes (Signed)
[facility] cancelled due to patient caregiver to pick patient up.

## 2016-07-18 NOTE — ED Notes (Signed)
Pt's chart found in "Expected."  Unsure who placed it there or why.  Pt has been discharged from department.

## 2016-07-18 NOTE — ED Provider Notes (Signed)
[facility] DEPT Provider Note   CSN: [id number] Arrival date & time: 07/18/16  1213     History   Chief Complaint Chief Complaint  Patient presents with  . Leg Pain  . BLE edema    HPI [name] is a 82 y.o. male.  He presents by EMS, from a motel, weakness. He states he has been having increased difficulty walking because of swelling and pain in his legs. He also complains of drainage from the wounds of his legs. He is previously seen a physician at the wound clinic, for the same thing, been treated with improvement, then discharged. He states this has happened several times. He denies fever, chills, nausea, focal weakness or paresthesia. He is taking his usual medications, without relief. There are no other known modifying factors.  HPI  Past Medical History:  Diagnosis Date  . Atrial fibrillation ([place])   . CHF (congestive heart failure) ([place])   . Chronic kidney disease   . Coronary artery disease   . Diabetes mellitus without complication ([place])   . Hypertension     Patient Active Problem List   Diagnosis Date Noted  . Acute CVA (cerebrovascular accident) ([place]) 12/09/2015  . Bradycardia 12/08/2015  . Fall 12/08/2015  . Anemia 12/08/2015  . Chronic kidney disease   . Atrial flutter ([place]) 08/29/2015  . Acute respiratory failure ([place]) 08/29/2015  . Acute on chronic diastolic heart failure ([place]) 08/28/2015  . Hypoxia 08/28/2015  . Atrial fibrillation ([place])   . Diabetes mellitus without complication ([place])   . Diabetes mellitus with complication ([place])   . Noncompliance with medications 08/03/2015  . Chronic diastolic CHF (congestive heart failure) ([place]) 07/31/2015  . Cellulitis of left lower extremity 07/31/2015  . Anemia of chronic renal failure, stage 3 (moderate) 07/31/2015  . Uncontrolled diabetes mellitus with diabetic nephropathy, with long-term current use of insulin ([place]) 07/31/2015  . Accelerated hypertension 07/31/2015  . Thrombocytopenia ([place])  07/31/2015  . Acute renal failure superimposed on stage 3 chronic kidney disease ([place]) 11/29/2014  . Chronic acquired lymphedema 06/25/2011    Past Surgical History:  Procedure Laterality Date  . COLONOSCOPY  06/21/2012   Procedure: COLONOSCOPY;  Surgeon: Juanita Craver, MD;  Location: [facility] ENDOSCOPY;  Service: Endoscopy;  Laterality: N/A;  . COLONOSCOPY Left 02/13/2014   Procedure: COLONOSCOPY;  Surgeon: Juanita Craver, MD;  Location: [facility] ENDOSCOPY;  Service: Endoscopy;  Laterality: Left;  . ESOPHAGOGASTRODUODENOSCOPY  06/19/2012   Procedure: ESOPHAGOGASTRODUODENOSCOPY (EGD);  Surgeon: Beryle Beams, MD;  Location: [facility] ENDOSCOPY;  Service: Endoscopy;  Laterality: N/A;  . LAPAROSCOPIC RIGHT COLECTOMY Right 02/15/2014   Procedure: LAPAROSCOPIC ASISSTED RIGHT  COLECTOMY, POSSIBLE OPEN;  Surgeon: Rolm Bookbinder, MD;  Location: [facility];  Service: General;  Laterality: Right;  . LEG SURGERY     Left leg surgery. broken leg       Home Medications    Prior to Admission medications   Medication Sig Start Date End Date Taking? Authorizing Provider  albuterol (PROVENTIL) (2.5 MG/3ML) 0.083% nebulizer solution Take 3 mLs (2.5 mg total) by nebulization every 4 (four) hours as needed for wheezing or shortness of breath. 09/02/15   Ripudeep Krystal Eaton, MD  amLODipine (NORVASC) 10 MG tablet Take 1 tablet (10 mg total) by mouth daily. 12/12/15   Ripudeep Krystal Eaton, MD  apixaban (ELIQUIS) 2.5 MG TABS tablet Take 1 tablet (2.5 mg total) by mouth 2 (two) times daily. 12/12/15   Ripudeep Krystal Eaton, MD  atorvastatin (LIPITOR) 20 MG tablet Take 1 tablet (  20 mg total) by mouth daily at 6 PM. 12/12/15   Ripudeep K Rai, MD  furosemide (LASIX) 40 MG tablet Take 1 tablet (40 mg total) by mouth daily. 09/04/15   Ripudeep Krystal Eaton, MD  hydrALAZINE (APRESOLINE) 50 MG tablet Take 1 tablet (50 mg total) by mouth every 8 (eight) hours. 12/12/15   Ripudeep Krystal Eaton, MD  insulin aspart (NOVOLOG) 100 UNIT/ML injection Inject 0-9 Units into the skin 3  (three) times daily with meals. Sliding scale CBG 70 - 120: 0 units CBG 121 - 150: 1 unit,  CBG 151 - 200: 2 units,  CBG 201 - 250: 3 units,  CBG 251 - 300: 5 units,  CBG 301 - 350: 7 units,  CBG 351 - 400: 9 units   CBG > 400: 9 units and notify your MD 12/12/15   Ripudeep Krystal Eaton, MD  insulin glargine (LANTUS) 100 UNIT/ML injection Inject 0.1 mLs (10 Units total) into the skin at bedtime. 12/12/15   Ripudeep Krystal Eaton, MD  isosorbide mononitrate (IMDUR) 60 MG 24 hr tablet Take 1 tablet (60 mg total) by mouth daily. 12/12/15   Ripudeep Krystal Eaton, MD  oxyCODONE-acetaminophen (PERCOCET/ROXICET) 5-325 MG tablet Take 1 tablet by mouth every 6 (six) hours as needed for moderate pain or severe pain. 12/12/15   Ripudeep Krystal Eaton, MD  polyethylene glycol [facility]) packet Take 17 g by mouth daily as needed. 12/12/15   Ripudeep Krystal Eaton, MD  senna-docusate (SENOKOT-S) 8.6-50 MG tablet Take 1 tablet by mouth at bedtime as needed for moderate constipation. 12/12/15   Ripudeep Krystal Eaton, MD    Family History Family History  Problem Relation Age of Onset  . Brain cancer Mother   . Diabetes type II Sister   . Narcolepsy Paternal Uncle     Social History Social History  Substance Use Topics  . Smoking status: Former Smoker    Quit date: 08/26/1998  . Smokeless tobacco: Never Used  . Alcohol use No     Comment: in the past     Allergies   Patient has no known allergies.   Review of Systems Review of Systems  All other systems reviewed and are negative.    Physical Exam Updated Vital Signs BP 170/98 (BP Location: Left Arm)   Pulse 67   Temp 97.8 F (36.6 C) (Oral)   Resp 18   SpO2 97%   Physical Exam  Constitutional: He is oriented to person, place, and time. He appears well-developed.  Morbidly obese  HENT:  Head: Normocephalic and atraumatic.  Right Ear: External ear normal.  Left Ear: External ear normal.  Eyes: Conjunctivae and EOM are normal. Pupils are equal, round, and reactive to light.  Neck:  Normal range of motion and phonation normal. Neck supple.  Cardiovascular: Normal rate, regular rhythm and normal heart sounds.   Pulmonary/Chest: Effort normal and breath sounds normal. He exhibits no bony tenderness.  Abdominal: Soft. There is no tenderness.  Musculoskeletal:  4+ swelling lower legs bilaterally with mild erythema, and multiple areas of skin break, with clear to yellow exudate. Both legs are diffusely tender, mildly. No proximal streaking to indicate lymphangitis. He resists movement of lower legs, secondary to pain.  Neurological: He is alert and oriented to person, place, and time. No cranial nerve deficit or sensory deficit. He exhibits normal muscle tone. Coordination normal.  Skin: Skin is warm, dry and intact.  Psychiatric: He has a normal mood and affect. His behavior is normal. Judgment and  thought content normal.  Nursing note and vitals reviewed.    ED Treatments / Results  Labs (all labs ordered are listed, but only abnormal results are displayed) Labs Reviewed  BASIC METABOLIC PANEL - Abnormal; Notable for the following:       Result Value   BUN 40 (*)    Creatinine, Ser 2.37 (*)    Calcium 7.9 (*)    GFR calc non Af Amer 24 (*)    GFR calc Af Amer 28 (*)    All other components within normal limits  BRAIN NATRIURETIC PEPTIDE - Abnormal; Notable for the following:    B Natriuretic Peptide 242.1 (*)    All other components within normal limits  CBC WITH DIFFERENTIAL/PLATELET - Abnormal; Notable for the following:    RBC 3.43 (*)    Hemoglobin 8.5 (*)    HCT 27.0 (*)    MCH 24.8 (*)    RDW 16.8 (*)    Monocytes Absolute 1.7 (*)    All other components within normal limits  I-STAT CG4 LACTIC ACID, ED - Abnormal; Notable for the following:    Lactic Acid, Venous 0.43 (*)    All other components within normal limits    BUN  Date Value Ref Range Status  07/18/2016 40 (H) 6 - 20 mg/dL Final  05/06/2016 28 (H) 6 - 20 mg/dL Final  12/12/2015 40 (H) 6  - 20 mg/dL Final  12/11/2015 32 (H) 6 - 20 mg/dL Final   Creat  Date Value Ref Range Status  10/07/2013 1.56 (H) 0.50 - 1.35 mg/dL Final  09/08/2012 2.51 (H) 0.50 - 1.35 mg/dL Final  03/05/2011 1.35 0.50 - 1.35 mg/dL Final   Creatinine, Ser  Date Value Ref Range Status  07/18/2016 2.37 (H) 0.61 - 1.24 mg/dL Final  05/06/2016 2.46 (H) 0.61 - 1.24 mg/dL Final  12/12/2015 2.85 (H) 0.61 - 1.24 mg/dL Final  12/11/2015 2.93 (H) 0.61 - 1.24 mg/dL Final    Hemoglobin  Date Value Ref Range Status  07/18/2016 8.5 (L) 13.0 - 17.0 g/dL Final  05/06/2016 8.9 (L) 13.0 - 17.0 g/dL Final  12/12/2015 9.1 (L) 13.0 - 17.0 g/dL Final  12/11/2015 8.9 (L) 13.0 - 17.0 g/dL Final   HGB  Date Value Ref Range Status  03/10/2014 8.7 (L) 13.0 - 17.1 g/dL Final    EKG  EKG Interpretation None       Radiology No results found.  Procedures Procedures (including critical care time)  Medications Ordered in ED Medications  0.9 %  sodium chloride infusion ( Intravenous New Bag/Given 07/18/16 1319)  amoxicillin-clavulanate (AUGMENTIN) 875-125 MG per tablet 1 tablet (not administered)  traMADol (ULTRAM) tablet 50 mg (not administered)     Initial Impression / Assessment and Plan / ED Course  I have reviewed the triage vital signs and the nursing notes.  Pertinent labs & imaging results that were available during my care of the patient were reviewed by me and considered in my medical decision making (see chart for details).  Clinical Course as of Jul 18 1426  Thu Jul 18, 2016  1354 Not elevated, reassuring Lactic Acid, Venous: (!) 0.43 [EW]  1354 High Creatinine: (!) 2.37 [EW]  1354 High BUN: (!) 40 [EW]  1354 low EGFR (Non-African Amer.): (!) 24 [EW]  1355 Mild elevation B Natriuretic Peptide: (!) 242.1 [EW]  1406 Low Hemoglobin: (!) 8.5 [EW]  1406 Normal WBC: 7.2 [EW]    Clinical Course User Index [EW] Daleen Bo, MD  Medications  0.9 %  sodium chloride infusion ( Intravenous  New Bag/Given 07/18/16 1319)  amoxicillin-clavulanate (AUGMENTIN) 875-125 MG per tablet 1 tablet (not administered)  traMADol (ULTRAM) tablet 50 mg (not administered)    Patient Vitals for the past 24 hrs:  BP Temp Temp src Pulse Resp SpO2  07/18/16 1223 170/98 97.8 F (36.6 C) Oral 67 18 97 %    2:27 PM Reevaluation with update and discussion. After initial assessment and treatment, an updated evaluation reveals No change in clinical status. Findings discussed with patient, all questions answered. Luca Burston L    Final Clinical Impressions(s) / ED Diagnoses   Final diagnoses:  Cellulitis of lower extremity, unspecified laterality  Bilateral leg pain   Recurrent lower leg cellulitis, with peripheral edema, and likely venous insufficiency. Doubt serious bacterial, infection, metabolic instability or impending vascular collapse.  Nursing Notes Reviewed/ Care Coordinated Applicable Imaging Reviewed Interpretation of Laboratory Data incorporated into ED treatment  The patient appears reasonably screened and/or stabilized for discharge and I doubt any other medical condition or other [facility] requiring further screening, evaluation, or treatment in the ED at this time prior to discharge.  Plan: Home Medications- continue; Home Treatments- rest, wound care, leg elevation; return here if the recommended treatment, does not improve the symptoms; Recommended follow up- PCP as soon as possible. Home health services consultation and treatment in the home setting.   New Prescriptions New Prescriptions   No medications on file     Daleen Bo, MD 07/18/16 1430

## 2016-07-18 NOTE — ED Notes (Signed)
Applied xeroform to legs and wrapped with kerlex.

## 2016-07-18 NOTE — ED Notes (Signed)
[facility] called for transport.  

## 2016-07-18 NOTE — ED Triage Notes (Addendum)
Per EMS pt coming from home with c/o chronic bilateral lower extremities edema, oozing yellow fluid,  worsening in the last 2 weeks, per EMS pt reports pan, worse with laying down. Pt wanted to go to wound care center, however they are closed for the holiday, so his caretaker advised him to come to ED. Per EMS pt is hypertensive 210/90

## 2016-07-18 NOTE — ED Notes (Signed)
Patient is now going by [name]

## 2016-07-22 ENCOUNTER — Emergency Department (HOSPITAL_COMMUNITY)
Admission: EM | Admit: 2016-07-22 | Discharge: 2016-07-23 | Disposition: A | Discharge status: Home / Self Care | Payer: Medicare Other | Attending: Emergency Medicine | Admitting: Emergency Medicine

## 2016-07-22 ENCOUNTER — Emergency Department (HOSPITAL_COMMUNITY): Payer: Medicare Other

## 2016-07-22 ENCOUNTER — Encounter (HOSPITAL_COMMUNITY): Payer: Self-pay

## 2016-07-22 DIAGNOSIS — R600 Localized edema: Secondary | ICD-10-CM | POA: Diagnosis not present

## 2016-07-22 DIAGNOSIS — Z87891 Personal history of nicotine dependence: Secondary | ICD-10-CM | POA: Insufficient documentation

## 2016-07-22 DIAGNOSIS — L97929 Non-pressure chronic ulcer of unspecified part of left lower leg with unspecified severity: Secondary | ICD-10-CM | POA: Diagnosis not present

## 2016-07-22 DIAGNOSIS — I83229 Varicose veins of left lower extremity with both ulcer of unspecified site and inflammation: Secondary | ICD-10-CM | POA: Diagnosis not present

## 2016-07-22 DIAGNOSIS — I5033 Acute on chronic diastolic (congestive) heart failure: Secondary | ICD-10-CM | POA: Diagnosis not present

## 2016-07-22 DIAGNOSIS — I83219 Varicose veins of right lower extremity with both ulcer of unspecified site and inflammation: Secondary | ICD-10-CM | POA: Insufficient documentation

## 2016-07-22 DIAGNOSIS — Z794 Long term (current) use of insulin: Secondary | ICD-10-CM | POA: Insufficient documentation

## 2016-07-22 DIAGNOSIS — M7989 Other specified soft tissue disorders: Secondary | ICD-10-CM | POA: Diagnosis present

## 2016-07-22 DIAGNOSIS — Z7409 Other reduced mobility: Secondary | ICD-10-CM

## 2016-07-22 DIAGNOSIS — N183 Chronic kidney disease, stage 3 (moderate): Secondary | ICD-10-CM | POA: Insufficient documentation

## 2016-07-22 DIAGNOSIS — I2510 Atherosclerotic heart disease of native coronary artery without angina pectoris: Secondary | ICD-10-CM | POA: Insufficient documentation

## 2016-07-22 DIAGNOSIS — L97919 Non-pressure chronic ulcer of unspecified part of right lower leg with unspecified severity: Secondary | ICD-10-CM | POA: Insufficient documentation

## 2016-07-22 DIAGNOSIS — Z7901 Long term (current) use of anticoagulants: Secondary | ICD-10-CM | POA: Insufficient documentation

## 2016-07-22 DIAGNOSIS — E119 Type 2 diabetes mellitus without complications: Secondary | ICD-10-CM | POA: Diagnosis not present

## 2016-07-22 DIAGNOSIS — I872 Venous insufficiency (chronic) (peripheral): Secondary | ICD-10-CM

## 2016-07-22 DIAGNOSIS — I130 Hypertensive heart and chronic kidney disease with heart failure and stage 1 through stage 4 chronic kidney disease, or unspecified chronic kidney disease: Secondary | ICD-10-CM | POA: Insufficient documentation

## 2016-07-22 LAB — CBC WITH DIFFERENTIAL/PLATELET
BASOS ABS: 0 10*3/uL (ref 0.0–0.1)
Basophils Relative: 0 %
EOS PCT: 2 %
Eosinophils Absolute: 0.2 10*3/uL (ref 0.0–0.7)
HEMATOCRIT: 25.9 % — AB (ref 39.0–52.0)
HEMOGLOBIN: 8.2 g/dL — AB (ref 13.0–17.0)
LYMPHS ABS: 1.9 10*3/uL (ref 0.7–4.0)
LYMPHS PCT: 25 %
MCH: 24.6 pg — AB (ref 26.0–34.0)
MCHC: 31.7 g/dL (ref 30.0–36.0)
MCV: 77.5 fL — AB (ref 78.0–100.0)
MONOS PCT: 23 %
Monocytes Absolute: 1.8 10*3/uL — ABNORMAL HIGH (ref 0.1–1.0)
Neutro Abs: 3.8 10*3/uL (ref 1.7–7.7)
Neutrophils Relative %: 50 %
PLATELETS: 191 10*3/uL (ref 150–400)
RBC: 3.34 MIL/uL — AB (ref 4.22–5.81)
RDW: 16.9 % — ABNORMAL HIGH (ref 11.5–15.5)
WBC: 7.7 10*3/uL (ref 4.0–10.5)

## 2016-07-22 LAB — BASIC METABOLIC PANEL
ANION GAP: 5 (ref 5–15)
BUN: 47 mg/dL — AB (ref 6–20)
CHLORIDE: 108 mmol/L (ref 101–111)
CO2: 24 mmol/L (ref 22–32)
Calcium: 7.9 mg/dL — ABNORMAL LOW (ref 8.9–10.3)
Creatinine, Ser: 2.38 mg/dL — ABNORMAL HIGH (ref 0.61–1.24)
GFR calc Af Amer: 28 mL/min — ABNORMAL LOW (ref >60)
GFR calc non Af Amer: 24 mL/min — ABNORMAL LOW (ref >60)
GLUCOSE: 149 mg/dL — AB (ref 65–99)
POTASSIUM: 5.5 mmol/L — AB (ref 3.5–5.1)
Sodium: 137 mmol/L (ref 135–145)

## 2016-07-22 LAB — URINALYSIS, ROUTINE W REFLEX MICROSCOPIC
Bilirubin Urine: NEGATIVE
GLUCOSE, UA: NEGATIVE mg/dL
Ketones, ur: NEGATIVE mg/dL
LEUKOCYTES UA: NEGATIVE
Nitrite: NEGATIVE
PH: 5 (ref 5.0–8.0)
PROTEIN: 100 mg/dL — AB
SPECIFIC GRAVITY, URINE: 1.019 (ref 1.005–1.030)

## 2016-07-22 LAB — BRAIN NATRIURETIC PEPTIDE: B Natriuretic Peptide: 268.7 pg/mL — ABNORMAL HIGH (ref 0.0–100.0)

## 2016-07-22 LAB — URINE MICROSCOPIC-ADD ON

## 2016-07-22 LAB — I-STAT TROPONIN, ED: Troponin i, poc: 0.02 ng/mL (ref 0.00–0.08)

## 2016-07-22 LAB — CBG MONITORING, ED: Glucose-Capillary: 133 mg/dL — ABNORMAL HIGH (ref 65–99)

## 2016-07-22 NOTE — Progress Notes (Signed)
CSW spoke with patients nephew Coralyn Mark regarding discharge plans. CSW informed patients nephew that patient could not be placed from the ED due to Medicare guidelines. Patient has an appointment tomorrow 11/27 with his PCP. CSW stated patient should speak with PCP about placement and request more information. Patient informed CSW that he receives $3800 a month and uses it to pay bills. CSW asked patients nephew if patient could use his monthly income to pay for SNF or assisted living facility placement, nephew replied "no".    Kingsley Spittle, LCSWA Clinical Social Worker [phone]

## 2016-07-22 NOTE — ED Notes (Signed)
Dialed [phone], # left by Coralyn Mark, pt's family member.  Received recording, LM requesting return call.

## 2016-07-22 NOTE — ED Notes (Signed)
Pt vomited post wound dressing change.

## 2016-07-22 NOTE — ED Notes (Signed)
Pt informed call has been made to [place] [facility] for ride home.  Pt stated "I've been threatening to call because he won't give me my money."

## 2016-07-22 NOTE — ED Triage Notes (Signed)
PT RECEIVED FROM A MOTEL VIA EMS FOR CHRONIC BILATERAL LEG PAIN, SWELLING, AND WEEPING. PER EMS, THE SON WOULD LIKE TO SPEAK TO A SOCIAL WORKER ABOUT PERMANENT PLACEMENT. THE PT HAS BEEN SEEN HERE MULTIPLE TIMES FOR SAME.

## 2016-07-22 NOTE — ED Notes (Signed)
Spoke with pt family member Coralyn Mark and advised that pt would be discharged back to residence via taxi.

## 2016-07-22 NOTE — ED Notes (Signed)
Pt's nephew Coralyn Mark is leaving and can be contacted at [phone]  Pt's keys are in his jacket pocket in case he gets discharged.

## 2016-07-22 NOTE — ED Notes (Signed)
Bed: WA20 Expected date:  Expected time:  Means of arrival:  Comments: EMS- BLE swelling

## 2016-07-22 NOTE — ED Notes (Signed)
Received call from Coralyn Mark, pt's family member.  He stated "he will have to get back home by transportation.  He doesn't have his clothes, his walker or his shoes."

## 2016-07-22 NOTE — ED Provider Notes (Signed)
[facility] DEPT Provider Note   CSN: [id number] Arrival date & time: 07/22/16  1726     History   Chief Complaint Chief Complaint  Patient presents with  . Leg Swelling    BILATERAL    HPI [name] is a 82 y.o. male.  The history is provided by the patient and a relative.  Leg Pain   This is a chronic problem. The current episode started more than 1 week ago. The problem occurs constantly. The problem has not changed since onset.The pain is present in the left lower leg and right lower leg. The quality of the pain is described as dull. The pain is moderate. Associated symptoms include limited range of motion and stiffness (and swelling with chronic ulceration). Treatments tried: dressings placed on ED visits. The treatment provided no relief. There has been no history of extremity trauma.    Past Medical History:  Diagnosis Date  . Atrial fibrillation ([place])   . CHF (congestive heart failure) ([place])   . Chronic kidney disease   . Coronary artery disease   . Diabetes mellitus without complication ([place])   . Hypertension     Patient Active Problem List   Diagnosis Date Noted  . Acute CVA (cerebrovascular accident) ([place]) 12/09/2015  . Bradycardia 12/08/2015  . Fall 12/08/2015  . Anemia 12/08/2015  . Chronic kidney disease   . Atrial flutter ([place]) 08/29/2015  . Acute respiratory failure ([place]) 08/29/2015  . Acute on chronic diastolic heart failure ([place]) 08/28/2015  . Hypoxia 08/28/2015  . Atrial fibrillation ([place])   . Diabetes mellitus without complication ([place])   . Diabetes mellitus with complication ([place])   . Noncompliance with medications 08/03/2015  . Chronic diastolic CHF (congestive heart failure) ([place]) 07/31/2015  . Cellulitis of left lower extremity 07/31/2015  . Anemia of chronic renal failure, stage 3 (moderate) 07/31/2015  . Uncontrolled diabetes mellitus with diabetic nephropathy, with long-term current use of insulin ([place]) 07/31/2015  .  Accelerated hypertension 07/31/2015  . Thrombocytopenia ([place]) 07/31/2015  . Acute renal failure superimposed on stage 3 chronic kidney disease ([place]) 11/29/2014  . Chronic acquired lymphedema 06/25/2011    Past Surgical History:  Procedure Laterality Date  . COLONOSCOPY  06/21/2012   Procedure: COLONOSCOPY;  Surgeon: Juanita Craver, MD;  Location: [facility] ENDOSCOPY;  Service: Endoscopy;  Laterality: N/A;  . COLONOSCOPY Left 02/13/2014   Procedure: COLONOSCOPY;  Surgeon: Juanita Craver, MD;  Location: [facility] ENDOSCOPY;  Service: Endoscopy;  Laterality: Left;  . ESOPHAGOGASTRODUODENOSCOPY  06/19/2012   Procedure: ESOPHAGOGASTRODUODENOSCOPY (EGD);  Surgeon: Beryle Beams, MD;  Location: [facility] ENDOSCOPY;  Service: Endoscopy;  Laterality: N/A;  . LAPAROSCOPIC RIGHT COLECTOMY Right 02/15/2014   Procedure: LAPAROSCOPIC ASISSTED RIGHT  COLECTOMY, POSSIBLE OPEN;  Surgeon: Rolm Bookbinder, MD;  Location: [facility];  Service: General;  Laterality: Right;  . LEG SURGERY     Left leg surgery. broken leg       Home Medications    Prior to Admission medications   Medication Sig Start Date End Date Taking? Authorizing Provider  albuterol (PROVENTIL) (2.5 MG/3ML) 0.083% nebulizer solution Take 3 mLs (2.5 mg total) by nebulization every 4 (four) hours as needed for wheezing or shortness of breath. 09/02/15   Ripudeep Krystal Eaton, MD  amLODipine (NORVASC) 10 MG tablet Take 1 tablet (10 mg total) by mouth daily. 12/12/15   Ripudeep Krystal Eaton, MD  amoxicillin-clavulanate (AUGMENTIN) 875-125 MG tablet Take 1 tablet by mouth 2 (two) times daily. One po bid x 7 days 07/18/16  Daleen Bo, MD  apixaban (ELIQUIS) 2.5 MG TABS tablet Take 1 tablet (2.5 mg total) by mouth 2 (two) times daily. 12/12/15   Ripudeep Krystal Eaton, MD  atorvastatin (LIPITOR) 20 MG tablet Take 1 tablet (20 mg total) by mouth daily at 6 PM. 12/12/15   Ripudeep K Rai, MD  furosemide (LASIX) 40 MG tablet Take 1 tablet (40 mg total) by mouth daily. 09/04/15   Ripudeep Krystal Eaton, MD    hydrALAZINE (APRESOLINE) 50 MG tablet Take 1 tablet (50 mg total) by mouth every 8 (eight) hours. 12/12/15   Ripudeep Krystal Eaton, MD  insulin aspart (NOVOLOG) 100 UNIT/ML injection Inject 0-9 Units into the skin 3 (three) times daily with meals. Sliding scale CBG 70 - 120: 0 units CBG 121 - 150: 1 unit,  CBG 151 - 200: 2 units,  CBG 201 - 250: 3 units,  CBG 251 - 300: 5 units,  CBG 301 - 350: 7 units,  CBG 351 - 400: 9 units   CBG > 400: 9 units and notify your MD 12/12/15   Ripudeep Krystal Eaton, MD  insulin glargine (LANTUS) 100 UNIT/ML injection Inject 0.1 mLs (10 Units total) into the skin at bedtime. 12/12/15   Ripudeep Krystal Eaton, MD  isosorbide mononitrate (IMDUR) 60 MG 24 hr tablet Take 1 tablet (60 mg total) by mouth daily. 12/12/15   Ripudeep Krystal Eaton, MD  oxyCODONE-acetaminophen (PERCOCET/ROXICET) 5-325 MG tablet Take 1 tablet by mouth every 6 (six) hours as needed for moderate pain or severe pain. 12/12/15   Ripudeep Krystal Eaton, MD  polyethylene glycol [facility]) packet Take 17 g by mouth daily as needed. 12/12/15   Ripudeep Krystal Eaton, MD  senna-docusate (SENOKOT-S) 8.6-50 MG tablet Take 1 tablet by mouth at bedtime as needed for moderate constipation. 12/12/15   Ripudeep Krystal Eaton, MD    Family History Family History  Problem Relation Age of Onset  . Brain cancer Mother   . Diabetes type II Sister   . Narcolepsy Paternal Uncle     Social History Social History  Substance Use Topics  . Smoking status: Former Smoker    Quit date: 08/26/1998  . Smokeless tobacco: Never Used  . Alcohol use No     Comment: in the past     Allergies   Patient has no known allergies.   Review of Systems Review of Systems  Musculoskeletal: Positive for stiffness (and swelling with chronic ulceration).  All other systems reviewed and are negative.    Physical Exam Updated Vital Signs BP (!) 157/54 (BP Location: Left Arm)   Pulse 68   Temp 97.5 F (36.4 C) (Oral)   Resp 18   Ht 5\' 10"  (1.778 m)   Wt 245 lb (111.1 kg)    SpO2 100%   BMI 35.15 kg/m   Physical Exam  Constitutional: He is oriented to person, place, and time. He appears well-developed and well-nourished. No distress.  HENT:  Head: Normocephalic and atraumatic.  Nose: Nose normal.  Eyes: Conjunctivae are normal.  Neck: Neck supple. No tracheal deviation present.  Cardiovascular: Normal rate, regular rhythm and normal heart sounds.   Pulmonary/Chest: Effort normal and breath sounds normal. No respiratory distress.  Abdominal: Soft. He exhibits no distension. There is no tenderness.  Musculoskeletal:  Bilateral LE edema with chronic ulceration and drainage. No erythema, fluctuance, purulence, or induration  Neurological: He is alert and oriented to person, place, and time.  Skin: Skin is warm and dry.  Psychiatric: He has a  normal mood and affect. His behavior is normal.     ED Treatments / Results  Labs (all labs ordered are listed, but only abnormal results are displayed) Labs Reviewed  CBC WITH DIFFERENTIAL/PLATELET - Abnormal; Notable for the following:       Result Value   RBC 3.34 (*)    Hemoglobin 8.2 (*)    HCT 25.9 (*)    MCV 77.5 (*)    MCH 24.6 (*)    RDW 16.9 (*)    Monocytes Absolute 1.8 (*)    All other components within normal limits  BASIC METABOLIC PANEL - Abnormal; Notable for the following:    Potassium 5.5 (*)    Glucose, Bld 149 (*)    BUN 47 (*)    Creatinine, Ser 2.38 (*)    Calcium 7.9 (*)    GFR calc non Af Amer 24 (*)    GFR calc Af Amer 28 (*)    All other components within normal limits  URINALYSIS, ROUTINE W REFLEX MICROSCOPIC (NOT AT [facility]) - Abnormal; Notable for the following:    Hgb urine dipstick MODERATE (*)    Protein, ur 100 (*)    All other components within normal limits  BRAIN NATRIURETIC PEPTIDE - Abnormal; Notable for the following:    B Natriuretic Peptide 268.7 (*)    All other components within normal limits  URINE MICROSCOPIC-ADD ON - Abnormal; Notable for the following:     Squamous Epithelial / LPF 0-5 (*)    Bacteria, UA RARE (*)    Casts HYALINE CASTS (*)    All other components within normal limits  CBG MONITORING, ED - Abnormal; Notable for the following:    Glucose-Capillary 133 (*)    All other components within normal limits  I-STAT TROPOININ, ED    EKG  EKG Interpretation  Date/Time:  Monday July 22 2016 17:47:02 EST Ventricular Rate:  74 PR Interval:    QRS Duration: 133 QT Interval:  461 QTC Calculation: 487 R Axis:   0 Text Interpretation:  Atrial flutter Multiple ventricular premature complexes IVCD, consider atypical RBBB Anteroseptal infarct, old Abnormal T, consider ischemia, lateral leads Minimal ST elevation, inferior leads Artifact in lead(s) I III aVR aVL aVF V2 No significant change since last tracing Confirmed by Jameison Haji MD, Edgar Corrigan [id number]) on 07/22/2016 7:38:12 PM       Radiology Dg Chest 2 View  Result Date: 07/22/2016 CLINICAL DATA:  Increasing weakness, bilateral leg swelling. History of CHF, atrial fibrillation and kidney disease. EXAM: CHEST  2 VIEW COMPARISON:  Chest radiograph December 08, 2015 FINDINGS: The cardiac silhouette appears moderately enlarged, similar. Mediastinal silhouette is nonsuspicious, calcified aortic knob. Similar pulmonary vascular congestion without pleural effusion or focal consolidation. No pneumothorax. Moderate degenerative change of the LEFT shoulder with old distal LEFT clavicle fracture. Mild degenerative change of the thoracic spine. IMPRESSION: Stable cardiomegaly and pulmonary vascular congestion. Electronically Signed   By: Elon Alas M.D.   On: 07/22/2016 19:37    Procedures Procedures (including critical care time)  Medications Ordered in ED Medications - No data to display   Initial Impression / Assessment and Plan / ED Course  I have reviewed the triage vital signs and the nursing notes.  Pertinent labs & imaging results that were available during my care of the patient  were reviewed by me and considered in my medical decision making (see chart for details).  Clinical Course     82 y.o. male presents with recurrent visit for  chronic bilateral leg edema and ulceration. His last dressing change was on ED visit. Son accompanies and wishes to seek placement as Pt lives alone. Does not meet inpatient criteria after screening. Has upcoming appointments tomorrow with PCP and with wound clinic. The son was instructed to start seeking outpatient SNF placement through the appropriate means as we are unable to do this from the ED. Return precautions discussed for worsening or new concerning symptoms.   Final Clinical Impressions(s) / ED Diagnoses   Final diagnoses:  Leg edema  Venous stasis dermatitis of both lower extremities  Immobility    New Prescriptions New Prescriptions   No medications on file     Leo Grosser, MD 07/23/16 0150

## 2016-07-22 NOTE — ED Notes (Signed)
Bed: [facility] Expected date:  Expected time:  Means of arrival:  Comments: 

## 2016-07-23 ENCOUNTER — Encounter (HOSPITAL_BASED_OUTPATIENT_CLINIC_OR_DEPARTMENT_OTHER): Payer: Medicare Other

## 2016-07-24 ENCOUNTER — Encounter (HOSPITAL_BASED_OUTPATIENT_CLINIC_OR_DEPARTMENT_OTHER): Payer: Medicare Other | Attending: Surgery

## 2016-07-24 DIAGNOSIS — E11622 Type 2 diabetes mellitus with other skin ulcer: Secondary | ICD-10-CM | POA: Insufficient documentation

## 2016-07-24 DIAGNOSIS — L97222 Non-pressure chronic ulcer of left calf with fat layer exposed: Secondary | ICD-10-CM | POA: Diagnosis not present

## 2016-07-24 DIAGNOSIS — Z79899 Other long term (current) drug therapy: Secondary | ICD-10-CM | POA: Diagnosis not present

## 2016-07-24 DIAGNOSIS — I890 Lymphedema, not elsewhere classified: Secondary | ICD-10-CM | POA: Diagnosis not present

## 2016-07-24 DIAGNOSIS — I130 Hypertensive heart and chronic kidney disease with heart failure and stage 1 through stage 4 chronic kidney disease, or unspecified chronic kidney disease: Secondary | ICD-10-CM | POA: Insufficient documentation

## 2016-07-24 DIAGNOSIS — L97212 Non-pressure chronic ulcer of right calf with fat layer exposed: Secondary | ICD-10-CM | POA: Insufficient documentation

## 2016-07-24 DIAGNOSIS — I252 Old myocardial infarction: Secondary | ICD-10-CM | POA: Insufficient documentation

## 2016-07-24 DIAGNOSIS — Z794 Long term (current) use of insulin: Secondary | ICD-10-CM | POA: Insufficient documentation

## 2016-07-24 DIAGNOSIS — I2510 Atherosclerotic heart disease of native coronary artery without angina pectoris: Secondary | ICD-10-CM | POA: Diagnosis not present

## 2016-07-24 DIAGNOSIS — Z7901 Long term (current) use of anticoagulants: Secondary | ICD-10-CM | POA: Insufficient documentation

## 2016-07-24 DIAGNOSIS — E1122 Type 2 diabetes mellitus with diabetic chronic kidney disease: Secondary | ICD-10-CM | POA: Insufficient documentation

## 2016-07-24 DIAGNOSIS — I509 Heart failure, unspecified: Secondary | ICD-10-CM | POA: Diagnosis not present

## 2016-07-24 DIAGNOSIS — N183 Chronic kidney disease, stage 3 (moderate): Secondary | ICD-10-CM | POA: Diagnosis not present

## 2016-07-24 DIAGNOSIS — E1140 Type 2 diabetes mellitus with diabetic neuropathy, unspecified: Secondary | ICD-10-CM | POA: Insufficient documentation

## 2016-07-24 DIAGNOSIS — Z87891 Personal history of nicotine dependence: Secondary | ICD-10-CM | POA: Insufficient documentation

## 2016-07-31 ENCOUNTER — Encounter (HOSPITAL_BASED_OUTPATIENT_CLINIC_OR_DEPARTMENT_OTHER): Payer: Medicare Other | Attending: Surgery

## 2016-07-31 DIAGNOSIS — I120 Hypertensive chronic kidney disease with stage 5 chronic kidney disease or end stage renal disease: Secondary | ICD-10-CM | POA: Diagnosis not present

## 2016-07-31 DIAGNOSIS — E1140 Type 2 diabetes mellitus with diabetic neuropathy, unspecified: Secondary | ICD-10-CM | POA: Insufficient documentation

## 2016-07-31 DIAGNOSIS — N186 End stage renal disease: Secondary | ICD-10-CM | POA: Insufficient documentation

## 2016-07-31 DIAGNOSIS — I87309 Chronic venous hypertension (idiopathic) without complications of unspecified lower extremity: Secondary | ICD-10-CM | POA: Diagnosis not present

## 2016-07-31 DIAGNOSIS — Z872 Personal history of diseases of the skin and subcutaneous tissue: Secondary | ICD-10-CM | POA: Insufficient documentation

## 2016-07-31 DIAGNOSIS — Z09 Encounter for follow-up examination after completed treatment for conditions other than malignant neoplasm: Secondary | ICD-10-CM | POA: Diagnosis not present

## 2016-07-31 DIAGNOSIS — E1122 Type 2 diabetes mellitus with diabetic chronic kidney disease: Secondary | ICD-10-CM | POA: Diagnosis not present

## 2016-07-31 DIAGNOSIS — I890 Lymphedema, not elsewhere classified: Secondary | ICD-10-CM | POA: Diagnosis not present

## 2016-07-31 DIAGNOSIS — I2510 Atherosclerotic heart disease of native coronary artery without angina pectoris: Secondary | ICD-10-CM | POA: Insufficient documentation

## 2016-09-05 ENCOUNTER — Emergency Department (HOSPITAL_COMMUNITY): Payer: Medicare HMO

## 2016-09-05 ENCOUNTER — Inpatient Hospital Stay (HOSPITAL_COMMUNITY)
Admission: EM | Admit: 2016-09-05 | Discharge: 2016-09-18 | DRG: 291 | Disposition: A | Discharge status: Skilled Nursing Facility | Payer: Medicare HMO | Attending: Family Medicine | Admitting: Family Medicine

## 2016-09-05 ENCOUNTER — Encounter (HOSPITAL_COMMUNITY): Payer: Self-pay | Admitting: Emergency Medicine

## 2016-09-05 DIAGNOSIS — I313 Pericardial effusion (noninflammatory): Secondary | ICD-10-CM | POA: Diagnosis not present

## 2016-09-05 DIAGNOSIS — R0602 Shortness of breath: Secondary | ICD-10-CM

## 2016-09-05 DIAGNOSIS — I319 Disease of pericardium, unspecified: Secondary | ICD-10-CM | POA: Diagnosis not present

## 2016-09-05 DIAGNOSIS — N183 Chronic kidney disease, stage 3 unspecified: Secondary | ICD-10-CM | POA: Diagnosis present

## 2016-09-05 DIAGNOSIS — I10 Essential (primary) hypertension: Secondary | ICD-10-CM | POA: Diagnosis present

## 2016-09-05 DIAGNOSIS — Z23 Encounter for immunization: Secondary | ICD-10-CM

## 2016-09-05 DIAGNOSIS — N179 Acute kidney failure, unspecified: Secondary | ICD-10-CM | POA: Diagnosis present

## 2016-09-05 DIAGNOSIS — D72829 Elevated white blood cell count, unspecified: Secondary | ICD-10-CM

## 2016-09-05 DIAGNOSIS — R338 Other retention of urine: Secondary | ICD-10-CM | POA: Insufficient documentation

## 2016-09-05 DIAGNOSIS — IMO0002 Reserved for concepts with insufficient information to code with codable children: Secondary | ICD-10-CM

## 2016-09-05 DIAGNOSIS — I482 Chronic atrial fibrillation: Secondary | ICD-10-CM

## 2016-09-05 DIAGNOSIS — I959 Hypotension, unspecified: Secondary | ICD-10-CM | POA: Diagnosis not present

## 2016-09-05 DIAGNOSIS — E875 Hyperkalemia: Secondary | ICD-10-CM | POA: Diagnosis not present

## 2016-09-05 DIAGNOSIS — I5033 Acute on chronic diastolic (congestive) heart failure: Secondary | ICD-10-CM | POA: Diagnosis present

## 2016-09-05 DIAGNOSIS — D631 Anemia in chronic kidney disease: Secondary | ICD-10-CM | POA: Diagnosis present

## 2016-09-05 DIAGNOSIS — I5032 Chronic diastolic (congestive) heart failure: Secondary | ICD-10-CM | POA: Diagnosis not present

## 2016-09-05 DIAGNOSIS — Z6841 Body Mass Index (BMI) 40.0 and over, adult: Secondary | ICD-10-CM | POA: Diagnosis not present

## 2016-09-05 DIAGNOSIS — R778 Other specified abnormalities of plasma proteins: Secondary | ICD-10-CM | POA: Diagnosis present

## 2016-09-05 DIAGNOSIS — Z85038 Personal history of other malignant neoplasm of large intestine: Secondary | ICD-10-CM | POA: Diagnosis not present

## 2016-09-05 DIAGNOSIS — E785 Hyperlipidemia, unspecified: Secondary | ICD-10-CM | POA: Diagnosis present

## 2016-09-05 DIAGNOSIS — I4892 Unspecified atrial flutter: Secondary | ICD-10-CM | POA: Diagnosis present

## 2016-09-05 DIAGNOSIS — E1121 Type 2 diabetes mellitus with diabetic nephropathy: Secondary | ICD-10-CM | POA: Diagnosis not present

## 2016-09-05 DIAGNOSIS — Z7901 Long term (current) use of anticoagulants: Secondary | ICD-10-CM

## 2016-09-05 DIAGNOSIS — R109 Unspecified abdominal pain: Secondary | ICD-10-CM

## 2016-09-05 DIAGNOSIS — Z794 Long term (current) use of insulin: Secondary | ICD-10-CM | POA: Diagnosis not present

## 2016-09-05 DIAGNOSIS — K560 Paralytic ileus: Secondary | ICD-10-CM | POA: Diagnosis not present

## 2016-09-05 DIAGNOSIS — I509 Heart failure, unspecified: Secondary | ICD-10-CM | POA: Diagnosis not present

## 2016-09-05 DIAGNOSIS — N401 Enlarged prostate with lower urinary tract symptoms: Secondary | ICD-10-CM | POA: Diagnosis present

## 2016-09-05 DIAGNOSIS — Z7984 Long term (current) use of oral hypoglycemic drugs: Secondary | ICD-10-CM

## 2016-09-05 DIAGNOSIS — I483 Typical atrial flutter: Secondary | ICD-10-CM | POA: Diagnosis not present

## 2016-09-05 DIAGNOSIS — E1122 Type 2 diabetes mellitus with diabetic chronic kidney disease: Secondary | ICD-10-CM | POA: Diagnosis present

## 2016-09-05 DIAGNOSIS — Z87891 Personal history of nicotine dependence: Secondary | ICD-10-CM

## 2016-09-05 DIAGNOSIS — I130 Hypertensive heart and chronic kidney disease with heart failure and stage 1 through stage 4 chronic kidney disease, or unspecified chronic kidney disease: Secondary | ICD-10-CM | POA: Diagnosis not present

## 2016-09-05 DIAGNOSIS — Z79899 Other long term (current) drug therapy: Secondary | ICD-10-CM

## 2016-09-05 DIAGNOSIS — I441 Atrioventricular block, second degree: Secondary | ICD-10-CM | POA: Diagnosis present

## 2016-09-05 DIAGNOSIS — R5381 Other malaise: Secondary | ICD-10-CM | POA: Diagnosis present

## 2016-09-05 DIAGNOSIS — I890 Lymphedema, not elsewhere classified: Secondary | ICD-10-CM

## 2016-09-05 DIAGNOSIS — E1165 Type 2 diabetes mellitus with hyperglycemia: Secondary | ICD-10-CM | POA: Diagnosis present

## 2016-09-05 DIAGNOSIS — I480 Paroxysmal atrial fibrillation: Secondary | ICD-10-CM | POA: Diagnosis present

## 2016-09-05 DIAGNOSIS — K5900 Constipation, unspecified: Secondary | ICD-10-CM | POA: Diagnosis not present

## 2016-09-05 DIAGNOSIS — I2510 Atherosclerotic heart disease of native coronary artery without angina pectoris: Secondary | ICD-10-CM | POA: Diagnosis present

## 2016-09-05 DIAGNOSIS — Z8673 Personal history of transient ischemic attack (TIA), and cerebral infarction without residual deficits: Secondary | ICD-10-CM

## 2016-09-05 DIAGNOSIS — N184 Chronic kidney disease, stage 4 (severe): Secondary | ICD-10-CM | POA: Diagnosis present

## 2016-09-05 DIAGNOSIS — R001 Bradycardia, unspecified: Secondary | ICD-10-CM | POA: Diagnosis present

## 2016-09-05 DIAGNOSIS — L8990 Pressure ulcer of unspecified site, unspecified stage: Secondary | ICD-10-CM | POA: Insufficient documentation

## 2016-09-05 DIAGNOSIS — N320 Bladder-neck obstruction: Secondary | ICD-10-CM | POA: Diagnosis present

## 2016-09-05 DIAGNOSIS — I3139 Other pericardial effusion (noninflammatory): Secondary | ICD-10-CM | POA: Diagnosis present

## 2016-09-05 LAB — CBC WITH DIFFERENTIAL/PLATELET
BASOS PCT: 0 %
Basophils Absolute: 0 10*3/uL (ref 0.0–0.1)
EOS ABS: 0 10*3/uL (ref 0.0–0.7)
Eosinophils Relative: 0 %
HCT: 24.4 % — ABNORMAL LOW (ref 39.0–52.0)
Hemoglobin: 7.6 g/dL — ABNORMAL LOW (ref 13.0–17.0)
Lymphocytes Relative: 15 %
Lymphs Abs: 2 10*3/uL (ref 0.7–4.0)
MCH: 24.7 pg — AB (ref 26.0–34.0)
MCHC: 31.1 g/dL (ref 30.0–36.0)
MCV: 79.2 fL (ref 78.0–100.0)
MONO ABS: 5 10*3/uL — AB (ref 0.1–1.0)
Monocytes Relative: 38 %
NEUTROS ABS: 6.1 10*3/uL (ref 1.7–7.7)
Neutrophils Relative %: 47 %
PLATELETS: 131 10*3/uL — AB (ref 150–400)
RBC: 3.08 MIL/uL — ABNORMAL LOW (ref 4.22–5.81)
RDW: 17.3 % — ABNORMAL HIGH (ref 11.5–15.5)
WBC: 13.1 10*3/uL — ABNORMAL HIGH (ref 4.0–10.5)

## 2016-09-05 LAB — BRAIN NATRIURETIC PEPTIDE: B Natriuretic Peptide: 557 pg/mL — ABNORMAL HIGH (ref 0.0–100.0)

## 2016-09-05 LAB — GLUCOSE, CAPILLARY: GLUCOSE-CAPILLARY: 110 mg/dL — AB (ref 65–99)

## 2016-09-05 LAB — BASIC METABOLIC PANEL
Anion gap: 9 (ref 5–15)
BUN: 62 mg/dL — AB (ref 6–20)
CALCIUM: 8.3 mg/dL — AB (ref 8.9–10.3)
CO2: 20 mmol/L — ABNORMAL LOW (ref 22–32)
CREATININE: 3.67 mg/dL — AB (ref 0.61–1.24)
Chloride: 107 mmol/L (ref 101–111)
GFR calc Af Amer: 17 mL/min — ABNORMAL LOW (ref >60)
GFR calc non Af Amer: 14 mL/min — ABNORMAL LOW (ref >60)
GLUCOSE: 124 mg/dL — AB (ref 65–99)
Potassium: 4.5 mmol/L (ref 3.5–5.1)
Sodium: 136 mmol/L (ref 135–145)

## 2016-09-05 LAB — URINALYSIS, ROUTINE W REFLEX MICROSCOPIC
BILIRUBIN URINE: NEGATIVE
Bacteria, UA: NONE SEEN
GLUCOSE, UA: NEGATIVE mg/dL
KETONES UR: NEGATIVE mg/dL
Leukocytes, UA: NEGATIVE
NITRITE: NEGATIVE
Protein, ur: 100 mg/dL — AB
Specific Gravity, Urine: 1.014 (ref 1.005–1.030)
pH: 5 (ref 5.0–8.0)

## 2016-09-05 LAB — TROPONIN I: Troponin I: 0.03 ng/mL (ref <0.03)

## 2016-09-05 MED ORDER — SODIUM CHLORIDE 0.9% FLUSH
3.0000 mL | INTRAVENOUS | Status: DC | PRN
  Administered 2016-09-12: 3 mL via INTRAVENOUS
  Filled 2016-09-05: qty 3

## 2016-09-05 MED ORDER — SODIUM CHLORIDE 0.9 % IV SOLN: 250.0000 mL | INTRAVENOUS | Status: DC | PRN

## 2016-09-05 MED ORDER — SODIUM CHLORIDE 0.9 % IV SOLN
Freq: Once | INTRAVENOUS | Status: AC
  Administered 2016-09-05: 11:00:00 via INTRAVENOUS

## 2016-09-05 MED ORDER — SODIUM CHLORIDE 0.9% FLUSH
3.0000 mL | Freq: Two times a day (BID) | INTRAVENOUS | Status: DC
  Administered 2016-09-05 – 2016-09-18 (×24): 3 mL via INTRAVENOUS

## 2016-09-05 MED ORDER — IPRATROPIUM-ALBUTEROL 0.5-2.5 (3) MG/3ML IN SOLN
3.0000 mL | Freq: Once | RESPIRATORY_TRACT | Status: AC
  Administered 2016-09-05: 3 mL via RESPIRATORY_TRACT
  Filled 2016-09-05: qty 3

## 2016-09-05 MED ORDER — TAMSULOSIN HCL 0.4 MG PO CAPS
0.4000 mg | ORAL_CAPSULE | Freq: Every day | ORAL | Status: DC
  Administered 2016-09-06 – 2016-09-11 (×6): 0.4 mg via ORAL
  Filled 2016-09-05 (×6): qty 1

## 2016-09-05 MED ORDER — FUROSEMIDE 10 MG/ML IJ SOLN
40.0000 mg | Freq: Two times a day (BID) | INTRAMUSCULAR | Status: DC
  Administered 2016-09-05 – 2016-09-08 (×6): 40 mg via INTRAVENOUS
  Filled 2016-09-05 (×6): qty 4

## 2016-09-05 MED ORDER — INSULIN ASPART 100 UNIT/ML ~~LOC~~ SOLN: 0.0000 [IU] | Freq: Every day | SUBCUTANEOUS | Status: DC

## 2016-09-05 MED ORDER — CARVEDILOL 6.25 MG PO TABS
6.2500 mg | ORAL_TABLET | Freq: Two times a day (BID) | ORAL | Status: DC
  Administered 2016-09-06 – 2016-09-09 (×7): 6.25 mg via ORAL
  Filled 2016-09-05 (×7): qty 1

## 2016-09-05 MED ORDER — INFLUENZA VAC SPLIT QUAD 0.5 ML IM SUSY
0.5000 mL | PREFILLED_SYRINGE | INTRAMUSCULAR | Status: AC
  Administered 2016-09-06: 0.5 mL via INTRAMUSCULAR
  Filled 2016-09-05: qty 0.5

## 2016-09-05 MED ORDER — NYSTATIN 100000 UNIT/GM EX POWD
1.0000 g | Freq: Two times a day (BID) | CUTANEOUS | Status: AC
  Administered 2016-09-05 – 2016-09-18 (×26): 1 g via TOPICAL
  Filled 2016-09-05: qty 15

## 2016-09-05 MED ORDER — ONDANSETRON HCL 4 MG/2ML IJ SOLN
4.0000 mg | Freq: Four times a day (QID) | INTRAMUSCULAR | Status: DC | PRN
  Administered 2016-09-09 – 2016-09-18 (×3): 4 mg via INTRAVENOUS
  Filled 2016-09-05 (×3): qty 2

## 2016-09-05 MED ORDER — APIXABAN 2.5 MG PO TABS
2.5000 mg | ORAL_TABLET | Freq: Two times a day (BID) | ORAL | Status: DC
  Administered 2016-09-05 – 2016-09-18 (×26): 2.5 mg via ORAL
  Filled 2016-09-05 (×26): qty 1

## 2016-09-05 MED ORDER — HYDRALAZINE HCL 50 MG PO TABS
50.0000 mg | ORAL_TABLET | Freq: Three times a day (TID) | ORAL | Status: DC
  Administered 2016-09-05 – 2016-09-10 (×14): 50 mg via ORAL
  Filled 2016-09-05 (×14): qty 1

## 2016-09-05 MED ORDER — ACETAMINOPHEN 325 MG PO TABS
650.0000 mg | ORAL_TABLET | ORAL | Status: DC | PRN
  Administered 2016-09-06: 650 mg via ORAL
  Filled 2016-09-05: qty 2

## 2016-09-05 MED ORDER — ATORVASTATIN CALCIUM 20 MG PO TABS
20.0000 mg | ORAL_TABLET | Freq: Every day | ORAL | Status: DC
  Administered 2016-09-05 – 2016-09-17 (×13): 20 mg via ORAL
  Filled 2016-09-05: qty 2
  Filled 2016-09-05: qty 1
  Filled 2016-09-05: qty 2
  Filled 2016-09-05 (×2): qty 1
  Filled 2016-09-05: qty 2
  Filled 2016-09-05: qty 1
  Filled 2016-09-05: qty 2
  Filled 2016-09-05 (×5): qty 1

## 2016-09-05 MED ORDER — INSULIN ASPART 100 UNIT/ML ~~LOC~~ SOLN
0.0000 [IU] | Freq: Three times a day (TID) | SUBCUTANEOUS | Status: DC
  Administered 2016-09-06 (×2): 2 [IU] via SUBCUTANEOUS
  Administered 2016-09-07: 1 [IU] via SUBCUTANEOUS
  Administered 2016-09-07: 2 [IU] via SUBCUTANEOUS
  Administered 2016-09-08 (×3): 1 [IU] via SUBCUTANEOUS
  Administered 2016-09-09: 2 [IU] via SUBCUTANEOUS
  Administered 2016-09-09: 1 [IU] via SUBCUTANEOUS
  Administered 2016-09-09 – 2016-09-11 (×5): 2 [IU] via SUBCUTANEOUS
  Administered 2016-09-11: 1 [IU] via SUBCUTANEOUS
  Administered 2016-09-11: 2 [IU] via SUBCUTANEOUS
  Administered 2016-09-12 (×2): 1 [IU] via SUBCUTANEOUS
  Administered 2016-09-12 – 2016-09-13 (×2): 2 [IU] via SUBCUTANEOUS
  Administered 2016-09-13: 1 [IU] via SUBCUTANEOUS
  Administered 2016-09-13 – 2016-09-14 (×2): 2 [IU] via SUBCUTANEOUS
  Administered 2016-09-14: 3 [IU] via SUBCUTANEOUS
  Administered 2016-09-14: 1 [IU] via SUBCUTANEOUS
  Administered 2016-09-15: 3 [IU] via SUBCUTANEOUS
  Administered 2016-09-15: 2 [IU] via SUBCUTANEOUS
  Administered 2016-09-15 – 2016-09-16 (×3): 3 [IU] via SUBCUTANEOUS
  Administered 2016-09-16 – 2016-09-17 (×2): 1 [IU] via SUBCUTANEOUS
  Administered 2016-09-17 – 2016-09-18 (×4): 2 [IU] via SUBCUTANEOUS

## 2016-09-05 MED ORDER — ISOSORBIDE MONONITRATE ER 30 MG PO TB24
30.0000 mg | ORAL_TABLET | Freq: Every day | ORAL | Status: DC
  Administered 2016-09-05 – 2016-09-09 (×5): 30 mg via ORAL
  Filled 2016-09-05 (×6): qty 1

## 2016-09-05 NOTE — ED Notes (Signed)
Belva Chimes [phone] cell  (uncle) please call with any concerns regarding patient's care.

## 2016-09-05 NOTE — ED Notes (Signed)
Patient A&O x4. Was able to urinate with assistance. Ecchymosis to groin and pubic area. Bilateral leg wraps to the lower leg. Pitting edema to both legs, dry, and scaly. Patient states he has a hard time with them caring for him at the facility.

## 2016-09-05 NOTE — ED Provider Notes (Signed)
[facility] DEPT Provider Note   CSN: [id number] Arrival date & time: 09/05/16  0539     History   Chief Complaint Chief Complaint  Patient presents with  . Urinary Retention   HPI [name] is a 83 y.o. male who presents with urinary retention. He is a poor historian. PMH significant for A. Fib on anticoagulation, diastolic CHF EF 0000000, CKD stage 3, CAD, chronic lymphedema, non insulin dependent DM, HTN, hx of colon cancer. He is from [facility]. He states last time he urinated was around "supper time" yesterday. He has been taking Lasix and is on a fluid restriction. He is upset because he feels like the nursing staff at the SNF are unable to care for him properly, leaving him soiled. He also endorses some SOB for the past several days and worsening pain and swelling in his lower extremities. Denies fever, chills, chest pain, cough, abdominal pain, N/V/D, dysuria.  HPI  Past Medical History:  Diagnosis Date  . Atrial fibrillation ([place])   . CHF (congestive heart failure) ([place])   . Chronic kidney disease   . Coronary artery disease   . Diabetes mellitus without complication ([place])   . Hypertension     Patient Active Problem List   Diagnosis Date Noted  . Acute CVA (cerebrovascular accident) ([place]) 12/09/2015  . Bradycardia 12/08/2015  . Fall 12/08/2015  . Anemia 12/08/2015  . Chronic kidney disease   . Atrial flutter ([place]) 08/29/2015  . Acute respiratory failure ([place]) 08/29/2015  . Acute on chronic diastolic heart failure ([place]) 08/28/2015  . Hypoxia 08/28/2015  . Atrial fibrillation ([place])   . Diabetes mellitus without complication ([place])   . Diabetes mellitus with complication ([place])   . Noncompliance with medications 08/03/2015  . Chronic diastolic CHF (congestive heart failure) ([place]) 07/31/2015  . Cellulitis of left lower extremity 07/31/2015  . Anemia of chronic renal failure, stage 3 (moderate) 07/31/2015  . Uncontrolled diabetes mellitus with  diabetic nephropathy, with long-term current use of insulin ([place]) 07/31/2015  . Accelerated hypertension 07/31/2015  . Thrombocytopenia ([place]) 07/31/2015  . Acute renal failure superimposed on stage 3 chronic kidney disease ([place]) 11/29/2014  . Chronic acquired lymphedema 06/25/2011    Past Surgical History:  Procedure Laterality Date  . COLONOSCOPY  06/21/2012   Procedure: COLONOSCOPY;  Surgeon: Juanita Craver, MD;  Location: [facility] ENDOSCOPY;  Service: Endoscopy;  Laterality: N/A;  . COLONOSCOPY Left 02/13/2014   Procedure: COLONOSCOPY;  Surgeon: Juanita Craver, MD;  Location: [facility] ENDOSCOPY;  Service: Endoscopy;  Laterality: Left;  . ESOPHAGOGASTRODUODENOSCOPY  06/19/2012   Procedure: ESOPHAGOGASTRODUODENOSCOPY (EGD);  Surgeon: Beryle Beams, MD;  Location: [facility] ENDOSCOPY;  Service: Endoscopy;  Laterality: N/A;  . LAPAROSCOPIC RIGHT COLECTOMY Right 02/15/2014   Procedure: LAPAROSCOPIC ASISSTED RIGHT  COLECTOMY, POSSIBLE OPEN;  Surgeon: Rolm Bookbinder, MD;  Location: [facility];  Service: General;  Laterality: Right;  . LEG SURGERY     Left leg surgery. broken leg       Home Medications    Prior to Admission medications   Medication Sig Start Date End Date Taking? Authorizing Provider  amLODipine (NORVASC) 10 MG tablet Take 1 tablet (10 mg total) by mouth daily. 12/12/15  Yes Ripudeep Krystal Eaton, MD  amoxicillin-clavulanate (AUGMENTIN) 875-125 MG tablet Take 1 tablet by mouth 2 (two) times daily. One po bid x 7 days 07/18/16  Yes Daleen Bo, MD  apixaban (ELIQUIS) 2.5 MG TABS tablet Take 1 tablet (2.5 mg total) by mouth 2 (two) times daily. 12/12/15  Yes Ripudeep Krystal Eaton, MD  atorvastatin (LIPITOR) 20 MG tablet Take 1 tablet (20 mg total) by mouth daily at 6 PM. 12/12/15  Yes Ripudeep K Rai, MD  carvedilol (COREG) 12.5 MG tablet Take 12.5 mg by mouth 2 (two) times daily with a meal.   Yes Historical Provider, MD  furosemide (LASIX) 40 MG tablet Take 1 tablet (40 mg total) by mouth daily. 09/04/15  Yes  Ripudeep Krystal Eaton, MD  glimepiride (AMARYL) 2 MG tablet Take 2 mg by mouth daily with breakfast.   Yes Historical Provider, MD  hydrALAZINE (APRESOLINE) 50 MG tablet Take 1 tablet (50 mg total) by mouth every 8 (eight) hours. 12/12/15  Yes Ripudeep Krystal Eaton, MD  isosorbide mononitrate (IMDUR) 60 MG 24 hr tablet Take 1 tablet (60 mg total) by mouth daily. 12/12/15  Yes Ripudeep Krystal Eaton, MD  nystatin (MYCOSTATIN/NYSTOP) powder Apply 1 g topically 2 (two) times daily. For 14 days   Yes Historical Provider, MD  albuterol (PROVENTIL) (2.5 MG/3ML) 0.083% nebulizer solution Take 3 mLs (2.5 mg total) by nebulization every 4 (four) hours as needed for wheezing or shortness of breath. Patient not taking: Reported on 07/22/2016 09/02/15   Ripudeep Krystal Eaton, MD  insulin aspart (NOVOLOG) 100 UNIT/ML injection Inject 0-9 Units into the skin 3 (three) times daily with meals. Sliding scale CBG 70 - 120: 0 units CBG 121 - 150: 1 unit,  CBG 151 - 200: 2 units,  CBG 201 - 250: 3 units,  CBG 251 - 300: 5 units,  CBG 301 - 350: 7 units,  CBG 351 - 400: 9 units   CBG > 400: 9 units and notify your MD Patient not taking: Reported on 09/05/2016 12/12/15   Ripudeep K Rai, MD  insulin glargine (LANTUS) 100 UNIT/ML injection Inject 0.1 mLs (10 Units total) into the skin at bedtime. Patient not taking: Reported on 09/05/2016 12/12/15   Ripudeep Krystal Eaton, MD  oxyCODONE-acetaminophen (PERCOCET/ROXICET) 5-325 MG tablet Take 1 tablet by mouth every 6 (six) hours as needed for moderate pain or severe pain. Patient not taking: Reported on 09/05/2016 12/12/15   Ripudeep Krystal Eaton, MD  polyethylene glycol [facility]) packet Take 17 g by mouth daily as needed. Patient not taking: Reported on 09/05/2016 12/12/15   Ripudeep Krystal Eaton, MD  senna-docusate (SENOKOT-S) 8.6-50 MG tablet Take 1 tablet by mouth at bedtime as needed for moderate constipation. Patient not taking: Reported on 09/05/2016 12/12/15   Ripudeep Krystal Eaton, MD    Family History Family History  Problem  Relation Age of Onset  . Brain cancer Mother   . Diabetes type II Sister   . Narcolepsy Paternal Uncle     Social History Social History  Substance Use Topics  . Smoking status: Former Smoker    Quit date: 08/26/1998  . Smokeless tobacco: Never Used  . Alcohol use No     Comment: in the past     Allergies   Metformin and related   Review of Systems Review of Systems  Constitutional: Negative for chills and fever.  Respiratory: Positive for shortness of breath.   Cardiovascular: Positive for leg swelling. Negative for chest pain.  Gastrointestinal: Negative for abdominal pain, diarrhea, nausea and vomiting.  Genitourinary: Positive for decreased urine volume and difficulty urinating. Negative for dysuria.  All other systems reviewed and are negative.    Physical Exam Updated Vital Signs BP 158/59 (BP Location: Left Arm)   Pulse 60   Temp 97.7 F (36.5 C) (Oral)  Resp 16   SpO2 96%   Physical Exam  Constitutional: He is oriented to person, place, and time. He appears well-developed and well-nourished. No distress.  Obese, rambling historian  HENT:  Head: Normocephalic and atraumatic.  Eyes: Conjunctivae are normal. Pupils are equal, round, and reactive to light. Right eye exhibits no discharge. Left eye exhibits no discharge. No scleral icterus.  Neck: Normal range of motion.  Cardiovascular: Normal rate and regular rhythm.  Exam reveals no gallop and no friction rub.   No murmur heard. Pulmonary/Chest: Effort normal. No respiratory distress. He has wheezes. He has no rales. He exhibits no tenderness.  Diffuse wheezing  Abdominal: Soft. He exhibits no distension and no mass. There is tenderness. There is no rebound and no guarding. No hernia.  Suprapubic tenderness  Genitourinary:  Genitourinary Comments: Erythematous, macerated skin in groin  Musculoskeletal:  Bilateral lower legs are wrapped. Marked edema with dry, flaky skin  Neurological: He is alert and  oriented to person, place, and time.  Skin: Skin is warm and dry.  Psychiatric: He has a normal mood and affect. His behavior is normal.  Nursing note and vitals reviewed.    ED Treatments / Results  Labs (all labs ordered are listed, but only abnormal results are displayed) Labs Reviewed  URINALYSIS, ROUTINE W REFLEX MICROSCOPIC - Abnormal; Notable for the following:       Result Value   APPearance CLOUDY (*)    Hgb urine dipstick LARGE (*)    Protein, ur 100 (*)    Squamous Epithelial / LPF 0-5 (*)    All other components within normal limits  BASIC METABOLIC PANEL - Abnormal; Notable for the following:    CO2 20 (*)    Glucose, Bld 124 (*)    BUN 62 (*)    Creatinine, Ser 3.67 (*)    Calcium 8.3 (*)    GFR calc non Af Amer 14 (*)    GFR calc Af Amer 17 (*)    All other components within normal limits  CBC WITH DIFFERENTIAL/PLATELET - Abnormal; Notable for the following:    WBC 13.1 (*)    RBC 3.08 (*)    Hemoglobin 7.6 (*)    HCT 24.4 (*)    MCH 24.7 (*)    RDW 17.3 (*)    Platelets 131 (*)    Monocytes Absolute 5.0 (*)    All other components within normal limits  BRAIN NATRIURETIC PEPTIDE - Abnormal; Notable for the following:    B Natriuretic Peptide 557.0 (*)    All other components within normal limits  BASIC METABOLIC PANEL - Abnormal; Notable for the following:    Chloride 113 (*)    CO2 21 (*)    Glucose, Bld 112 (*)    BUN 60 (*)    Creatinine, Ser 3.65 (*)    Calcium 8.2 (*)    GFR calc non Af Amer 14 (*)    GFR calc Af Amer 17 (*)    All other components within normal limits  GLUCOSE, CAPILLARY - Abnormal; Notable for the following:    Glucose-Capillary 110 (*)    All other components within normal limits  TROPONIN I - Abnormal; Notable for the following:    Troponin I 0.03 (*)    All other components within normal limits  TROPONIN I - Abnormal; Notable for the following:    Troponin I 0.04 (*)    All other components within normal limits    GLUCOSE, CAPILLARY -  Abnormal; Notable for the following:    Glucose-Capillary 107 (*)    All other components within normal limits  MRSA PCR SCREENING  TSH  HEMOGLOBIN A1C  TROPONIN I    EKG  EKG Interpretation None       Radiology Dg Chest 2 View  Result Date: 09/05/2016 CLINICAL DATA:  Shortness of Breath EXAM: CHEST  2 VIEW COMPARISON:  July 22, 2016 FINDINGS: There is a left pleural effusion with left base atelectasis. Lungs elsewhere clear. There is cardiomegaly with pulmonary venous hypertension. There is atherosclerotic calcification in the aorta. No adenopathy. There is degenerative change in each shoulder. IMPRESSION: Small left pleural effusion with left base atelectasis. Lungs elsewhere clear. There is underlying pulmonary vascular congestion. There is aortic atherosclerosis. Electronically Signed   By: Lowella Grip III M.D.   On: 09/05/2016 11:07    Procedures Procedures (including critical care time)  Medications Ordered in ED Medications  0.9 %  sodium chloride infusion ( Intravenous New Bag/Given 09/05/16 1046)  ipratropium-albuterol (DUONEB) 0.5-2.5 (3) MG/3ML nebulizer solution 3 mL (3 mLs Nebulization Given 09/05/16 1019)    Initial Impression / Assessment and Plan / ED Course  I have reviewed the triage vital signs and the nursing notes.  Pertinent labs & imaging results that were available during my care of the patient were reviewed by me and considered in my medical decision making (see chart for details).  Clinical Course    83 year old male presents with urinary retention and worsening CHF. He is hypertensive, bradycardic and progressively more SOB and hypoxic while in the ED. Breathing tx given and he was placed on 2L O2. CXR remarkable for small left pleural effusion with vascular congestion. He clinically appears dry however. Will start on NS infusion.   He is able to urinate in the ED however bladder scan reveals 250cc post-void residual.  Orders for foley placed. CBC remarkable for mild leukocytosis of 13.1 and worsening anemia with Hgb 7.6. BMP remarkable for elevated BUN/SCr from baseline. BNP is elevated as well.   Due to multiple co-morbidities with new urinary retention and worsening CHF, will admit to hospitalist. Shared visit with Dr. Myrene Buddy. Spoke with Dr. Dyann Kief who will admit. Appreciate assistance.  Final Clinical Impressions(s) / ED Diagnoses   Final diagnoses:  AKI (acute kidney injury) [facility])    New Prescriptions New Prescriptions   No medications on file     Recardo Evangelist, PA-C 09/06/16 [place], MD 09/18/16 [phone]

## 2016-09-05 NOTE — ED Notes (Signed)
Patient transported to X-ray 

## 2016-09-05 NOTE — ED Notes (Signed)
Pt is resting comfortably, VS WNL.  Waiting for a bed assignment.

## 2016-09-05 NOTE — ED Triage Notes (Signed)
Per EMS pt comes from [facility] and has not been able to urinate for the past 12 hours  Pt takes Lasix 80 mg daily  Staff states they restricted his fluid intake yesterday  Pt is alert and oriented x 4  Skin warm and dry

## 2016-09-05 NOTE — ED Notes (Signed)
Bed: [id number] Expected date:  Expected time:  Means of arrival:  Comments: 83 yo M  Urinary retention

## 2016-09-05 NOTE — H&P (Signed)
History and Physical    [name] [id number] DOB: Jan 19, 1934 DOA: 09/05/2016  Referring Provider: Recardo Evangelist, PA PCP: [name], PA   Patient coming from: SNF  Chief Complaint: SOB, worsening LE swelling and inability to urinate  HPI: [name] is a 83 y.o. male with PMH significant for chronic diastolic HF, CKD stage, 3, obesity, LE lymphedema, CAD, HTN, atrial fibrillation (on eliquis), HLD and diabetes with nephropathy; who presented to ED from his SNF due to SOB, worsening LE edema and inability to urinate. Patient endorses been unable to pee despite lasix for the last 14 hours or so. Also has experienced SOB and orthopnea for the last 2-3 days, along with worsening on his bilateral LE edema (now unable to pick his left up on his own or to walk properly due to swelling). He denies CP, Fever, HA's, blurred vision, abd pain, dysuria, hematuria, melena, hematochezia or any other complaints.  ED Course: in Ed work up demonstrated vascular congestion on CXR, worsening renal function from baseline, increase swelling and SOB requiring oxygen supplementation by [place]. Patient also with > 250 cc's on bladder scan and unable to urinate. A foley catheter was placed in ED. TRH called to admit patient for further evaluation and treatment.  Review of Systems:  All other systems reviewed and apart from HPI, are negative.  Past Medical History:  Diagnosis Date  . Atrial fibrillation ([place])   . CHF (congestive heart failure) ([place])   . Chronic kidney disease   . Coronary artery disease   . Diabetes mellitus without complication ([place])   . Hypertension     Past Surgical History:  Procedure Laterality Date  . COLONOSCOPY  06/21/2012   Procedure: COLONOSCOPY;  Surgeon: Juanita Craver, MD;  Location: [facility] ENDOSCOPY;  Service: Endoscopy;  Laterality: N/A;  . COLONOSCOPY Left 02/13/2014   Procedure: COLONOSCOPY;  Surgeon: Juanita Craver, MD;  Location: [facility] ENDOSCOPY;  Service: Endoscopy;   Laterality: Left;  . ESOPHAGOGASTRODUODENOSCOPY  06/19/2012   Procedure: ESOPHAGOGASTRODUODENOSCOPY (EGD);  Surgeon: Beryle Beams, MD;  Location: [facility] ENDOSCOPY;  Service: Endoscopy;  Laterality: N/A;  . LAPAROSCOPIC RIGHT COLECTOMY Right 02/15/2014   Procedure: LAPAROSCOPIC ASISSTED RIGHT  COLECTOMY, POSSIBLE OPEN;  Surgeon: Rolm Bookbinder, MD;  Location: [place];  Service: General;  Laterality: Right;  . LEG SURGERY     Left leg surgery. broken leg     reports that he quit smoking about 18 years ago. He has never used smokeless tobacco. He reports that he does not drink alcohol or use drugs.  Allergies  Allergen Reactions  . Metformin And Related     Reaction: pt states he thinks he threw up    Family History  Problem Relation Age of Onset  . Brain cancer Mother   . Diabetes type II Sister   . Narcolepsy Paternal Uncle      Prior to Admission medications   Medication Sig Start Date End Date Taking? Authorizing Provider  amLODipine (NORVASC) 10 MG tablet Take 1 tablet (10 mg total) by mouth daily. 12/12/15  Yes Ripudeep Krystal Eaton, MD  amoxicillin-clavulanate (AUGMENTIN) 875-125 MG tablet Take 1 tablet by mouth 2 (two) times daily. One po bid x 7 days 07/18/16  Yes Daleen Bo, MD  apixaban (ELIQUIS) 2.5 MG TABS tablet Take 1 tablet (2.5 mg total) by mouth 2 (two) times daily. 12/12/15  Yes Ripudeep Krystal Eaton, MD  atorvastatin (LIPITOR) 20 MG tablet Take 1 tablet (20 mg total) by mouth daily at 6 PM. 12/12/15  Yes Ripudeep Krystal Eaton, MD  carvedilol (COREG) 12.5 MG tablet Take 12.5 mg by mouth 2 (two) times daily with a meal.   Yes Historical Provider, MD  furosemide (LASIX) 40 MG tablet Take 1 tablet (40 mg total) by mouth daily. 09/04/15  Yes Ripudeep Krystal Eaton, MD  glimepiride (AMARYL) 2 MG tablet Take 2 mg by mouth daily with breakfast.   Yes Historical Provider, MD  hydrALAZINE (APRESOLINE) 50 MG tablet Take 1 tablet (50 mg total) by mouth every 8 (eight) hours. 12/12/15  Yes Ripudeep Krystal Eaton, MD    isosorbide mononitrate (IMDUR) 60 MG 24 hr tablet Take 1 tablet (60 mg total) by mouth daily. 12/12/15  Yes Ripudeep Krystal Eaton, MD  nystatin (MYCOSTATIN/NYSTOP) powder Apply 1 g topically 2 (two) times daily. For 14 days   Yes Historical Provider, MD    Physical Exam: Vitals:   09/05/16 1600 09/05/16 1700 09/05/16 1741 09/05/16 1818  BP: 176/67 173/63 173/63 (!) 158/51  Pulse: (!) 56 (!) 50 (!) 58 (!) 53  Resp:  (!) 8 19   Temp:    97.8 F (36.6 C)  TempSrc:    Oral  SpO2: 99% 98% 99% 99%  Weight:    124 kg (273 lb 5.9 oz)  Height:    5' 10.5" (1.791 m)    Constitutional: Obese, in mild resp distress, with positive orthopnea and inability to speak in full sentences.  Eyes: PERTLA, lids and conjunctivae normal, no icterus, no nystagmus. ENMT: Mucous membranes are moist. Posterior pharynx clear of any exudate or lesions.  Neck: normal, supple, no masses, no thyromegaly, positive JVD Respiratory: fair air movement, positive bibasilar crackles, no wheezing and No accessory muscle use.  Cardiovascular: S1 & S2 heard, regular rate and rhythm, no murmurs / rubs / gallops. 3++ lower extremity edema.  Abdomen: Obese, no tenderness (but was complaining of suprapubic discomfort earlier), no masses palpated. No hepatosplenomegaly. Bowel sounds normal.  Musculoskeletal: no clubbing / cyanosis. Decrease range of motion due to excessive swelling on his legs. No contractures. Normal muscle tone.  Skin: scaly and dry skin, positive stasis dermatitis on LE bilaterally; also with erythema in his groin and positive bruises in that area. Neurologic: CN 2-12 grossly intact. Sensation intact, DTR normal. Strength 3-3/5 in all 4 limbs due to poor effort and deconditioning.  Psychiatric: Normal judgment and insight. Alert and oriented x 3. Normal mood.    Labs on Admission: I have personally reviewed following labs and imaging studies  CBC:  Recent Labs Lab 09/05/16 0857  WBC 13.1*  NEUTROABS 6.1  HGB  7.6*  HCT 24.4*  MCV 79.2  PLT [id number]*   Basic Metabolic Panel:  Recent Labs Lab 09/05/16 0857  NA 136  K 4.5  CL 107  CO2 20*  GLUCOSE 124*  BUN 62*  CREATININE 3.67*  CALCIUM 8.3*   GFR: Estimated Creatinine Clearance: 21.4 mL/min (by C-G formula based on SCr of 3.67 mg/dL (H)).   Urine analysis:    Component Value Date/Time   COLORURINE YELLOW 09/05/2016 1301   APPEARANCEUR CLOUDY (A) 09/05/2016 1301   LABSPEC 1.014 09/05/2016 1301   PHURINE 5.0 09/05/2016 1301   GLUCOSEU NEGATIVE 09/05/2016 1301   HGBUR LARGE (A) 09/05/2016 1301   BILIRUBINUR NEGATIVE 09/05/2016 1301   KETONESUR NEGATIVE 09/05/2016 1301   PROTEINUR 100 (A) 09/05/2016 1301   UROBILINOGEN 1.0 05/18/2015 1407   NITRITE NEGATIVE 09/05/2016 1301   LEUKOCYTESUR NEGATIVE 09/05/2016 1301    Radiological Exams on Admission: Dg  Chest 2 View  Result Date: 09/05/2016 CLINICAL DATA:  Shortness of Breath EXAM: CHEST  2 VIEW COMPARISON:  July 22, 2016 FINDINGS: There is a left pleural effusion with left base atelectasis. Lungs elsewhere clear. There is cardiomegaly with pulmonary venous hypertension. There is atherosclerotic calcification in the aorta. No adenopathy. There is degenerative change in each shoulder. IMPRESSION: Small left pleural effusion with left base atelectasis. Lungs elsewhere clear. There is underlying pulmonary vascular congestion. There is aortic atherosclerosis. Electronically Signed   By: Lowella Grip III M.D.   On: 09/05/2016 11:07    EKG:  None  Assessment/Plan 1-acute on chronic diastolic heart failure -will admit to telemetry  -low sodium diet, strict intake and output -started on IV lasix -will follow electrolytes and renal function closely -continue b-blocker and hydralazine  -no ACE or ARB due to renal function -will check troponin, EKG and repeat echocardiogram  -depending findings and clinical response might benefit of cardiology evaluation.  2-Acute renal failure  superimposed on stage 3 chronic kidney disease (HCC) -due to poor perfusion from CHF exacerbation, plus component of urinary retention most likely from BPH -will check renal US -treat CHF and follow renal function trend -poor candidate for HD -if renal function failed to improved; will required renal service consultation -no signs of UTI on UA -foley catheter placed in ED -will start low dose flomax  3-Anemia of chronic renal failure, stage 3 (moderate) -Hgb 7.6 -no signs of acute bleeding -will monitor trend and if Hgb less than 7 will need transfusion   4-Uncontrolled diabetes mellitus with diabetic nephropathy, with long-term current use of insulin (HCC) -will use SSI -given worsening on his renal function, will hold on long acting insulin for now -holding oral hypoglycemic regimen while inpatient  -will check A1C  5-Atrial fibrillation (HCC) -will continue b-blocker for rate control -continue eliquis -will monitor on telemetry   6-HTN (hypertension) -BP stable -will continue current antihypertensive drugs  7-CAD -no CP; but SOB could be equivalent to angina -will check troponin and monitor on telemetry -EKG and 2-D echo ordered -will continue coreg, lipitor and Imdur -patient on apixaban  8-HLD -will continue statins  9-Acute urinary retention -as mentioned above; foley catheter placed while in ED -started on flomax -will follow response and once stable attempt voiding trial  10-Severe obesity (class 3) -Body mass index is 38.67 kg/m. -low calorie diet discussed with patient   Time: 70 minutes  DVT prophylaxis: on eliquis Code Status: Full code  Family Communication: no family at bedtime  Disposition Plan: once medically stable, will discharge back to SNF Consults called: none  Admission status: inpatient, LOS > 2 midnights, telemetry bed   Barton Dubois MD [facility] Pager [phone]  If 7PM-7AM, please contact  night-coverage [email or web address] Password [facility]  09/05/2016, 7:59 PM

## 2016-09-05 NOTE — ED Notes (Signed)
Urinal at bedside.  

## 2016-09-05 NOTE — ED Notes (Signed)
Bladder Scan: 0ML ?

## 2016-09-06 ENCOUNTER — Inpatient Hospital Stay (HOSPITAL_COMMUNITY): Payer: Medicare HMO

## 2016-09-06 DIAGNOSIS — I509 Heart failure, unspecified: Secondary | ICD-10-CM

## 2016-09-06 LAB — ECHOCARDIOGRAM COMPLETE
E decel time: 250 msec
EERAT: 11.16
FS: 23 % — AB (ref 28–44)
HEIGHTINCHES: 70.5 in
IVS/LV PW RATIO, ED: 0.97
LA ID, A-P, ES: 51 mm
LA vol A4C: 71.8 ml
LA vol index: 30.1 mL/m2
LADIAMINDEX: 2.13 cm/m2
LAVOL: 71.9 mL
LEFT ATRIUM END SYS DIAM: 51 mm
LV E/e' medial: 11.16
LV PW d: 15 mm — AB (ref 0.6–1.1)
LV TDI E'LATERAL: 9.14
LVEEAVG: 11.16
LVELAT: 9.14 cm/s
LVOT VTI: 29.9 cm
LVOT area: 3.14 cm2
LVOT peak grad rest: 7 mmHg
LVOT peak vel: 130 cm/s
LVOTD: 20 mm
LVOTSV: 94 mL
Lateral S' vel: 10.1 cm/s
MV Dec: 250
MV Peak grad: 4 mmHg
MV pk A vel: 47.5 m/s
MVPKEVEL: 102 m/s
TDI e' medial: 6.31
WEIGHTICAEL: 4345.71 [oz_av]

## 2016-09-06 LAB — GLUCOSE, CAPILLARY
Glucose-Capillary: 107 mg/dL — ABNORMAL HIGH (ref 65–99)
Glucose-Capillary: 161 mg/dL — ABNORMAL HIGH (ref 65–99)
Glucose-Capillary: 139 mg/dL — ABNORMAL HIGH (ref 65–99)
Glucose-Capillary: 158 mg/dL — ABNORMAL HIGH (ref 65–99)

## 2016-09-06 LAB — BASIC METABOLIC PANEL
ANION GAP: 8 (ref 5–15)
BUN: 60 mg/dL — ABNORMAL HIGH (ref 6–20)
CHLORIDE: 113 mmol/L — AB (ref 101–111)
CO2: 21 mmol/L — AB (ref 22–32)
Calcium: 8.2 mg/dL — ABNORMAL LOW (ref 8.9–10.3)
Creatinine, Ser: 3.65 mg/dL — ABNORMAL HIGH (ref 0.61–1.24)
GFR calc non Af Amer: 14 mL/min — ABNORMAL LOW (ref >60)
GFR, EST AFRICAN AMERICAN: 17 mL/min — AB (ref >60)
Glucose, Bld: 112 mg/dL — ABNORMAL HIGH (ref 65–99)
Potassium: 4.6 mmol/L (ref 3.5–5.1)
Sodium: 142 mmol/L (ref 135–145)

## 2016-09-06 LAB — TROPONIN I
TROPONIN I: 0.04 ng/mL — AB (ref <0.03)
TROPONIN I: 0.03 ng/mL — AB (ref <0.03)

## 2016-09-06 LAB — TSH: TSH: 1.175 u[IU]/mL (ref 0.350–4.500)

## 2016-09-06 LAB — MRSA PCR SCREENING: MRSA BY PCR: NEGATIVE

## 2016-09-06 MED ORDER — HYDROCODONE-ACETAMINOPHEN 5-325 MG PO TABS
1.0000 | ORAL_TABLET | ORAL | Status: DC | PRN
  Administered 2016-09-06 – 2016-09-09 (×5): 1 via ORAL
  Administered 2016-09-09: 2 via ORAL
  Administered 2016-09-11 – 2016-09-13 (×6): 1 via ORAL
  Administered 2016-09-13 – 2016-09-14 (×2): 2 via ORAL
  Administered 2016-09-14: 1 via ORAL
  Administered 2016-09-15 – 2016-09-17 (×5): 2 via ORAL
  Filled 2016-09-06: qty 1
  Filled 2016-09-06: qty 2
  Filled 2016-09-06: qty 1
  Filled 2016-09-06: qty 2
  Filled 2016-09-06 (×3): qty 1
  Filled 2016-09-06 (×2): qty 2
  Filled 2016-09-06 (×3): qty 1
  Filled 2016-09-06 (×2): qty 2
  Filled 2016-09-06 (×2): qty 1
  Filled 2016-09-06: qty 2
  Filled 2016-09-06: qty 1
  Filled 2016-09-06: qty 2
  Filled 2016-09-06: qty 1

## 2016-09-06 MED ORDER — HYDROCERIN EX CREA
1.0000 "application " | TOPICAL_CREAM | CUTANEOUS | Status: DC
  Administered 2016-09-13 – 2016-09-17 (×2): 1 via TOPICAL
  Filled 2016-09-06 (×2): qty 113

## 2016-09-06 MED ORDER — PERFLUTREN LIPID MICROSPHERE
1.0000 mL | INTRAVENOUS | Status: AC | PRN
  Administered 2016-09-06: 2 mL via INTRAVENOUS
  Filled 2016-09-06: qty 10

## 2016-09-06 MED ORDER — PERFLUTREN LIPID MICROSPHERE
INTRAVENOUS | Status: AC
Start: 2016-09-06 — End: 2016-09-06
  Filled 2016-09-06: qty 10

## 2016-09-06 NOTE — Consult Note (Signed)
[place] Nurse wound consult note Reason for Consult: Chronic lymphedema with skin changes. Unna's Boots changed twice weekly in long term care facility have managed to keep ulcerations at [place].  Would continue with this POC. Wound type:Venous insufficiency/lymphedema Pressure Injury POA: No Measurement:No open wounds at this time Wound bed:N/A Drainage (amount, consistency, odor) None Periwound:Very dry skin with plaque formations. Edema is less at medial and posterior aspects at the time of my assessment as patient is with LEs elevated on pillows.  The indentions of the pillow are evident in these regions. Dressing procedure/placement/frequency: I will continue the POC that is in place from the SNF i.e., twice weekly Unna's Boot applications as this has apparently kept ulcerations at [place].  Elevation is added as is a cleansing and emollient application prior to UB application on Tuesdays and Fridays. [place] nursing team will not follow, but will remain available to this patient, the nursing and medical teams.  Please re-consult if needed. Thanks, Maudie Flakes, MSN, RN, [place], Arther Abbott  Pager# [phone]

## 2016-09-06 NOTE — Evaluation (Signed)
Physical Therapy Evaluation Patient Details Name: [name] MRN: [id number] DOB: 11/12/1933 Today's Date: 09/06/2016   History of Present Illness  83 y.o. male with PMH significant for chronic diastolic HF, CKD stage, 3, obesity, LE lymphedema, CAD, HTN, atrial fibrillation (on eliquis), HLD,  and diabetes and admitted for acute on chronic diastolic heart failure  Clinical Impression  Pt admitted with above diagnosis. Pt currently with functional limitations due to the deficits listed below (see PT Problem List).  Pt will benefit from skilled PT to increase their independence and safety with mobility to allow discharge to the venue listed below.   Pt assisted with OOB to [facility] and then recliner.  Pt appears confused today however cooperative.  Recommend d/c to SNF.     Follow Up Recommendations SNF    Equipment Recommendations  None recommended by PT    Recommendations for Other Services       Precautions / Restrictions Precautions Precautions: Fall      Mobility  Bed Mobility Overal bed mobility: Needs Assistance Bed Mobility: Supine to Sit     Supine to sit: Max assist;+2 for physical assistance     General bed mobility comments: verbal cues for technique, pt requests assist due to weakness  Transfers Overall transfer level: Needs assistance Equipment used: Rolling walker (2 wheeled) Transfers: Sit to/from [facility] Sit to Stand: Min assist;From elevated surface Stand pivot transfers: Min assist       General transfer comment: verbal cues for safe technique, requires increased time, assist to steady with transitioning hands, pivoted bed to [facility] to recliner  Ambulation/Gait                Stairs            Wheelchair Mobility    Modified Rankin (Stroke Patients Only)       Balance Overall balance assessment: Needs assistance         Standing balance support: Bilateral upper extremity supported;During functional  activity Standing balance-Leahy Scale: Poor                               Pertinent Vitals/Pain Pain Assessment: Faces Faces Pain Scale: Hurts little more Pain Location: buttocks Pain Descriptors / Indicators: Sore Pain Intervention(s): Limited activity within patient's tolerance;Monitored during session;Repositioned    Home Living Family/patient expects to be discharged to:: Other (Comment)                 Additional Comments: per chart and staff, pt from hotel - pt unable to recall    Prior Function Level of Independence: Needs assistance   Gait / Transfers Assistance Needed: RW      Comments: pt appears confused, unable to recall living environment, states he is ambulatory     Hand Dominance        Extremity/Trunk Assessment        Lower Extremity Assessment Lower Extremity Assessment: Generalized weakness (bil UNNA boots)       Communication   Communication: No difficulties  Cognition Arousal/Alertness: Awake/alert Behavior During Therapy: Flat affect Overall Cognitive Status: No family/caregiver present to determine baseline cognitive functioning                 General Comments: pt appears confused, unable to recall living situation PTA, able to state he is at [place]     Assessment/Plan  PT Assessment Patient needs continued PT services  PT Problem List Decreased strength;Decreased activity tolerance;Decreased balance;Decreased mobility;Decreased cognition;Obesity;Decreased safety awareness;Decreased knowledge of use of DME          PT Treatment Interventions Gait training;DME instruction;Therapeutic activities;Therapeutic exercise;Functional mobility training;Patient/family education    PT Goals (Current goals can be found in the Care Plan section)  Acute Rehab PT Goals PT Goal Formulation: With patient Time For Goal Achievement: 09/20/16 Potential to Achieve Goals: Good     Frequency Min 3X/week   Barriers to discharge        Co-evaluation               End of Session Equipment Utilized During Treatment: Gait belt Activity Tolerance: Patient tolerated treatment well Patient left: in chair;with call bell/phone within reach;with chair alarm set Nurse Communication: Mobility status         Time: [id number] PT Time Calculation (min) (ACUTE ONLY): 19 min   Charges:   PT Evaluation $PT Eval Moderate Complexity: 1 Procedure     PT G Codes:        Priyanka Causey,KATHrine E 09/06/2016, 11:54 AM Carmelia Bake, PT, DPT 09/06/2016 Pager: [phone]

## 2016-09-06 NOTE — Progress Notes (Signed)
  Echocardiogram 2D Echocardiogram with Definity has been performed.  Tresa Res 09/06/2016, 11:43 AM

## 2016-09-06 NOTE — Progress Notes (Signed)
OT Cancellation Note  Patient Details Name: Kylynn Swartzentruber MRN: [id number] DOB: 02/20/1934   Cancelled Treatment:    Reason Eval/Treat Not Completed: Medical issues which prohibited therapy.  Noted troponin trending up. Will check back.  Thresia Ramanathan 09/06/2016, 9:19 AM  Lesle Chris, OTR/L [phone] 09/06/2016

## 2016-09-06 NOTE — Progress Notes (Signed)
NP on call was notified via text page that patient had 5 beats of asymptomatic V-tach.  NP was also notified of critical Troponin of 0.03.  No new orders received, will continue to monitor patient.

## 2016-09-06 NOTE — Progress Notes (Signed)
PROGRESS NOTE    [name]  [id number] DOB: 1934/04/05 DOA: 09/05/2016 PCP: [name], PA   Chief Complaint  Patient presents with  . Urinary Retention     Brief Narrative:  On 09/05/2016 by Dr. Barton Dubois Dailey Dorer is a 83 y.o. male with PMH significant for chronic diastolic HF, CKD stage, 3, obesity, LE lymphedema, CAD, HTN, atrial fibrillation (on eliquis), HLD and diabetes with nephropathy; who presented to ED from his SNF due to SOB, worsening LE edema and inability to urinate. Patient endorses been unable to pee despite lasix for the last 14 hours or so. Also has experienced SOB and orthopnea for the last 2-3 days, along with worsening on his bilateral LE edema (now unable to pick his left up on his own or to walk properly due to swelling). He denies CP, Fever, HA's, blurred vision, abd pain, dysuria, hematuria, melena, hematochezia or any other complaints. Assessment & Plan   Acute on chronic diastolic heart failure -previous echocardiogram noted grade 2 diastolic dysfunciton -Echocardiogram showed EF 55-60%, left ventricular diastolic function parameters are normal, left atrium was moderately to severely dilated -BNP on admission 557 -Chest x-ray showed small left pleural effusion with primary congestion -Continue IV Lasix -Monitor intake and output, daily weights -Currently no ACEi or ARB due to renal function  Acute renal failure superimposed onCKD, stage IV -due to poor perfusion from CHF exacerbation, plus component of urinary retention most likely from BPH -Creatinine upon admission 3.67, baseline 2.6-2.9 -Renal US pending -follow intake/output -If no improvement, will consult nephrology -Continue to monitor BMP closely as patient is diuresing   Anemia of chronic renal failure, stage 3  -Hgb 7.6 -no signs of acute bleeding -will monitor trend and if Hgb less than 7 will need transfusion  -Continue to monitor CBC (may improve with  diuresis)  Diabetes mellitus, type II with nephropathy  -Continue insulin size, CBG monitoring -Lantus and glipizide held -Hemoglobin A1c pending  Atrial fibrillation  -Continue Coreg for rate control -continue eliquis -CHADSVASC at least 4 (given age, h/o dCHF, DM, HTN)  Essential Hypertension -BP stable -Continue hydralazine, Lasix, Coreg, Imdur  CAD/Elevated troponin -no CP; but SOB could be equivalent to angina -troponin peaked at 0.04, 0.03. Currently flat, unlikely ACS.  -Possibly secondary to demand. -Echocardiogram as listed above -Continue coreg, lipitor and Imdur, apixaban  Hyperlipidemia -Continue statin  Acute urinary retention -as mentioned above; foley catheter placed while in ED -Continueflomax -will follow response and once stable attempt voiding trial  Severe obesity  -Body mass index is 38.67 kg/m. -low calorie diet discussed with patient  Deconditioning -Not sure where patient is from.  H&P noted SNF, social work/case management noted Hotel.  -PT consulted and recommended SNF -OT consulted  Lower extremity edema -Unna boots in place, wound care consulted  DVT Prophylaxis  Eliquis  Code Status: Full  Family Communication: None at bedside  Disposition Plan: Admitted. Continue diuresing, monitor BMP.  Consultants None  Procedures  Echocardiogram  Antibiotics   Anti-infectives    None      Subjective:   [name] seen and examined today. Patient feels breathing is improved although continues complain of lower extremity swelling. Denies chest pain, dizziness, headache, abdominal pain, nausea or vomiting.  Objective:   Vitals:   09/05/16 1818 09/05/16 2029 09/06/16 0455 09/06/16 0515  BP: (!) 158/51 (!) 159/58 (!) 154/56   Pulse: (!) 53 (!) 54 60   Resp:   20   Temp: 97.8 F (36.6 C)  98.9 F (37.2 C) 99.1 F (37.3 C)   TempSrc: Oral Oral Oral   SpO2: 99% 100% 97%   Weight: 124 kg (273 lb 5.9 oz)   123.2 kg  (271 lb 9.7 oz)  Height: 5' 10.5" (1.791 m)       Intake/Output Summary (Last 24 hours) at 09/06/16 1307 Last data filed at 09/06/16 0900  Gross per 24 hour  Intake              684 ml  Output             1250 ml  Net             -566 ml   Filed Weights   09/05/16 1818 09/06/16 0515  Weight: 124 kg (273 lb 5.9 oz) 123.2 kg (271 lb 9.7 oz)    Exam  General: Well developed, well nourished, NAD, appears stated age  HEENT: NCAT,  mucous membranes moist.   Neck: Supple, + JVD, no masses  Cardiovascular: S1 S2 auscultated, no rubs, murmurs or gallops. irregular  Respiratory: Diminished but clear  Abdomen: Soft, nontender, nondistended, + bowel sounds  Extremities: warm dry without cyanosis clubbing. LE in unna boots, +edema and stasis dermatitis skin changes bilaterally  Neuro: AAOx3, nonfocal  Psych: Appropriate   Data Reviewed: I have personally reviewed following labs and imaging studies  CBC:  Recent Labs Lab 09/05/16 0857  WBC 13.1*  NEUTROABS 6.1  HGB 7.6*  HCT 24.4*  MCV 79.2  PLT [id number]*   Basic Metabolic Panel:  Recent Labs Lab 09/05/16 0857 09/06/16 0402  NA 136 142  K 4.5 4.6  CL 107 113*  CO2 20* 21*  GLUCOSE 124* 112*  BUN 62* 60*  CREATININE 3.67* 3.65*  CALCIUM 8.3* 8.2*   GFR: Estimated Creatinine Clearance: 21.4 mL/min (by C-G formula based on SCr of 3.65 mg/dL (H)). Liver Function Tests: No results for input(s): AST, ALT, ALKPHOS, BILITOT, PROT, ALBUMIN in the last 168 hours. No results for input(s): LIPASE, AMYLASE in the last 168 hours. No results for input(s): AMMONIA in the last 168 hours. Coagulation Profile: No results for input(s): INR, PROTIME in the last 168 hours. Cardiac Enzymes:  Recent Labs Lab 09/05/16 2211 09/06/16 0402 09/06/16 1052  TROPONINI 0.03* 0.04* 0.03*   BNP (last 3 results) No results for input(s): PROBNP in the last 8760 hours. HbA1C: No results for input(s): HGBA1C in the last 72  hours. CBG:  Recent Labs Lab 09/05/16 2146 09/06/16 0739 09/06/16 1145  GLUCAP 110* 107* 161*   Lipid Profile: No results for input(s): CHOL, HDL, LDLCALC, TRIG, CHOLHDL, LDLDIRECT in the last 72 hours. Thyroid Function Tests:  Recent Labs  09/06/16 0402  TSH 1.175   Anemia Panel: No results for input(s): VITAMINB12, FOLATE, FERRITIN, TIBC, IRON, RETICCTPCT in the last 72 hours. Urine analysis:    Component Value Date/Time   COLORURINE YELLOW 09/05/2016 1301   APPEARANCEUR CLOUDY (A) 09/05/2016 1301   LABSPEC 1.014 09/05/2016 1301   PHURINE 5.0 09/05/2016 1301   GLUCOSEU NEGATIVE 09/05/2016 1301   HGBUR LARGE (A) 09/05/2016 1301   BILIRUBINUR NEGATIVE 09/05/2016 1301   KETONESUR NEGATIVE 09/05/2016 1301   PROTEINUR 100 (A) 09/05/2016 1301   UROBILINOGEN 1.0 05/18/2015 1407   NITRITE NEGATIVE 09/05/2016 1301   LEUKOCYTESUR NEGATIVE 09/05/2016 1301   Sepsis Labs: @LABRCNTIP (procalcitonin:4,lacticidven:4)  ) Recent Results (from the past 240 hour(s))  MRSA PCR Screening     Status: None   Collection Time: 09/05/16 10:29 PM  Result Value Ref Range Status   MRSA by PCR NEGATIVE NEGATIVE Final    Comment:        The GeneXpert MRSA Assay (FDA approved for NASAL specimens only), is one component of a comprehensive MRSA colonization surveillance program. It is not intended to diagnose MRSA infection nor to guide or monitor treatment for MRSA infections.       Radiology Studies: Dg Chest 2 View  Result Date: 09/05/2016 CLINICAL DATA:  Shortness of Breath EXAM: CHEST  2 VIEW COMPARISON:  July 22, 2016 FINDINGS: There is a left pleural effusion with left base atelectasis. Lungs elsewhere clear. There is cardiomegaly with pulmonary venous hypertension. There is atherosclerotic calcification in the aorta. No adenopathy. There is degenerative change in each shoulder. IMPRESSION: Small left pleural effusion with left base atelectasis. Lungs elsewhere clear. There is  underlying pulmonary vascular congestion. There is aortic atherosclerosis. Electronically Signed   By: Lowella Grip III M.D.   On: 09/05/2016 11:07     Scheduled Meds: . apixaban  2.5 mg Oral BID  . atorvastatin  20 mg Oral q1800  . carvedilol  6.25 mg Oral BID WC  . furosemide  40 mg Intravenous Q12H  . hydrALAZINE  50 mg Oral Q8H  . insulin aspart  0-5 Units Subcutaneous QHS  . insulin aspart  0-9 Units Subcutaneous TID WC  . isosorbide mononitrate  30 mg Oral Daily  . nystatin  1 g Topical BID  . sodium chloride flush  3 mL Intravenous Q12H  . tamsulosin  0.4 mg Oral QPC supper   Continuous Infusions:   LOS: 1 day   Time Spent in minutes   30 minutes  [name] D.O. on 09/06/2016 at 1:07 PM  Between 7am to 7pm - Pager - [phone]  After 7pm go to [email or web address] - password [email or web address]  And look for the night coverage person covering for me after hours  [facility] Office  [phone]

## 2016-09-06 NOTE — Care Management Note (Signed)
Case Management Note  Patient Details  Name: [name] MRN: [id number] Date of Birth: Aug 21, 1934  Subjective/Objective: PT recc SNF-CSW following.                   Action/Plan:   Expected Discharge Date:   (UNKNOWN)               Expected Discharge Plan:  Skilled Nursing Facility  In-House Referral:  Clinical Social Work  Discharge planning Services  CM Consult  Post Acute Care Choice:    Choice offered to:     DME Arranged:    DME Agency:     HH Arranged:    [place] Agency:     Status of Service:  In process, will continue to follow  If discussed at Long Length of Stay Meetings, dates discussed:    Additional Comments:  Dessa Phi, RN 09/06/2016, 1:53 PM

## 2016-09-06 NOTE — Evaluation (Addendum)
Occupational Therapy Evaluation Patient Details Name: [name] MRN: [id number] DOB: [birth year] Today's Date: 09/06/2016    History of Present Illness 83 y.o. male with PMH significant for chronic diastolic HF, CKD stage, 3, obesity, LE lymphedema, CAD, HTN, atrial fibrillation (on eliquis), HLD,  and diabetes and admitted for acute on chronic diastolic heart failure   Clinical Impression   Pt was admitted for the above. Unsure of PLOF; pt states he lived alone.  Goals in acute are for min A/min guard for standing grooming and toileting.  Pt is unable to lift legs for adls and bil LEs are very painful at this time.  Will continue to add goals as pt tolerates more activity    Follow Up Recommendations  SNF    Equipment Recommendations  3 in 1 bedside commode    Recommendations for Other Services       Precautions / Restrictions Precautions Precautions: Fall Restrictions Weight Bearing Restrictions: No      Mobility Bed Mobility Overal bed mobility: Needs Assistance Bed Mobility: Supine to Sit     Supine to sit: Mod assist;Max assist;+2 for physical assistance     General bed mobility comments: assist for bil LEs and guided trunk back to bed  Transfers Overall transfer level: Needs assistance Equipment used: Rolling walker (2 wheeled) Transfers: Sit to/from [facility] Sit to Stand: Mod assist;+2 physical assistance Stand pivot transfers: Min assist       General transfer comment: from recliner.  Assist to move legs under him; cues for ue placement    Balance Overall balance assessment: Needs assistance         Standing balance support: Bilateral upper extremity supported;During functional activity Standing balance-Leahy Scale: Poor                              ADL Overall ADL's : Needs assistance/impaired     Grooming: Wash/dry hands;Sitting;Set up   Upper Body Bathing: Min guard;Sitting   Lower Body Bathing:  Maximal assistance;+2 for physical assistance;Sit to/from stand   Upper Body Dressing : Minimal assistance;Sitting   Lower Body Dressing: Total assistance;+2 for physical assistance;Sit to/from stand   Toilet Transfer: Moderate assistance;+2 for physical assistance;RW (for sit to stand from recliner; min A for pivotal steps)             General ADL Comments: pt was c/o pain when I arrived and requested to get back to bed.  Performed UB bathing while we waited for assistance for transfer back to bed.       Vision     Perception     Praxis      Pertinent Vitals/Pain Pain Assessment: Faces Faces Pain Scale: Hurts whole lot Pain Location: buttocks and bil LEs Pain Descriptors / Indicators: Sore Pain Intervention(s): Limited activity within patient's tolerance;Monitored during session;Repositioned     Hand Dominance Right   Extremity/Trunk Assessment Upper Extremity Assessment Upper Extremity Assessment: LUE deficits/detail LUE Deficits / Details: strength 3+/5; RUE WFLs   Lower Extremity Assessment Lower Extremity Assessment: Generalized weakness (bil UNNA boots)       Communication Communication Communication: No difficulties   Cognition Arousal/Alertness: Awake/alert Behavior During Therapy: Restless Overall Cognitive Status: No family/caregiver present to determine baseline cognitive functioning                 General Comments: followed one step commands, able to state he lives alone   General Comments  Exercises       Shoulder Instructions      Home Living Family/patient expects to be discharged to:: Other (Comment)                                 Additional Comments: per notes from [facility].  Pt states he lives alone      Prior Functioning/Environment Level of Independence: Needs assistance  Gait / Transfers Assistance Needed: RW      Comments: pt states he lives alone        OT Problem List: Decreased  strength;Decreased activity tolerance;Impaired balance (sitting and/or standing);Decreased knowledge of use of DME or AE;Decreased cognition;Pain   OT Treatment/Interventions: Self-care/ADL training;DME and/or AE instruction;Patient/family education;Balance training;Therapeutic activities;Cognitive remediation/compensation    OT Goals(Current goals can be found in the care plan section) Acute Rehab OT Goals Patient Stated Goal: back to bed OT Goal Formulation: With patient Time For Goal Achievement: 09/20/16 Potential to Achieve Goals: Fair ADL Goals Pt Will Perform Grooming: with min guard assist;standing Pt Will Transfer to Toilet: with min assist;ambulating;bedside commode Pt Will Perform Toileting - Clothing Manipulation and hygiene: with min assist;sit to/from stand  OT Frequency: Min 2X/week   Barriers to D/C:            Co-evaluation              End of Session    Activity Tolerance: Patient limited by pain Patient left: in bed;with call bell/phone within reach;with bed alarm set   Time: [id number] OT Time Calculation (min): 22 min Charges:  OT General Charges $OT Visit: 1 Procedure OT Evaluation $OT Eval Moderate Complexity: 1 Procedure G-Codes:    Marili Vader 09-17-16, 1:08 PM  Lesle Chris, OTR/L [phone] 09-17-16

## 2016-09-06 NOTE — Progress Notes (Signed)
NUTRITION NOTE  Consult for education related to CHF. Pt is currently on a Heart Healthy diet and consumed 75% of both breakfast and lunch. He is ordered 40 mg IV Lasix BID. Per chart review, pt weighed 245 lbs on 07/22/16 and weighed 271 lbs this AM (+26 lbs in ~2 months). No family or visitors present at this time. Pt falling asleep and snoring the entire time RD was in room and would intermittently mumble. Placed "Low Sodium Nutrition Therapy" and "Sodium Content of Foods" handouts in Heart Failure folder at bedside. Noted that PT recommends SNF placement.   Please re-consult RD if further nutrition-related needs arise.    Jarome Matin, MS, RD, LDN, [facility] Inpatient Clinical Dietitian Pager # [phone] After hours/weekend pager # [phone]

## 2016-09-06 NOTE — Care Management Note (Signed)
Case Management Note  Patient Details  Name: Moosa Klima MRN: [id number] Date of Birth: 1934/06/15  Subjective/Objective:  83 y/o m admitted w/Uncontrolled DM. Hx: bilat foot wounds. Per CSW who called [facility]-not a patient there, per patient's dtr-patient lives in a hotel. PT cons-await recc.                  Action/Plan:d/c SNF.   Expected Discharge Date:   (UNKNOWN)               Expected Discharge Plan:  Skilled Nursing Facility  In-House Referral:  Clinical Social Work  Discharge planning Services  CM Consult  Post Acute Care Choice:    Choice offered to:     DME Arranged:    DME Agency:     HH Arranged:    [place] Agency:     Status of Service:  In process, will continue to follow  If discussed at Long Length of Stay Meetings, dates discussed:    Additional Comments:  Dessa Phi, RN 09/06/2016, 10:52 AM

## 2016-09-07 LAB — CBC
HEMATOCRIT: 22.3 % — AB (ref 39.0–52.0)
Hemoglobin: 7 g/dL — ABNORMAL LOW (ref 13.0–17.0)
MCH: 24.3 pg — ABNORMAL LOW (ref 26.0–34.0)
MCHC: 31.4 g/dL (ref 30.0–36.0)
MCV: 77.4 fL — AB (ref 78.0–100.0)
Platelets: 95 10*3/uL — ABNORMAL LOW (ref 150–400)
RBC: 2.88 MIL/uL — AB (ref 4.22–5.81)
RDW: 17 % — ABNORMAL HIGH (ref 11.5–15.5)
WBC: 10 10*3/uL (ref 4.0–10.5)

## 2016-09-07 LAB — GLUCOSE, CAPILLARY
GLUCOSE-CAPILLARY: 190 mg/dL — AB (ref 65–99)
Glucose-Capillary: 110 mg/dL — ABNORMAL HIGH (ref 65–99)
Glucose-Capillary: 145 mg/dL — ABNORMAL HIGH (ref 65–99)
Glucose-Capillary: 174 mg/dL — ABNORMAL HIGH (ref 65–99)

## 2016-09-07 LAB — BASIC METABOLIC PANEL
ANION GAP: 7 (ref 5–15)
BUN: 58 mg/dL — ABNORMAL HIGH (ref 6–20)
CHLORIDE: 109 mmol/L (ref 101–111)
CO2: 22 mmol/L (ref 22–32)
Calcium: 8 mg/dL — ABNORMAL LOW (ref 8.9–10.3)
Creatinine, Ser: 3.51 mg/dL — ABNORMAL HIGH (ref 0.61–1.24)
GFR calc non Af Amer: 15 mL/min — ABNORMAL LOW (ref >60)
GFR, EST AFRICAN AMERICAN: 18 mL/min — AB (ref >60)
Glucose, Bld: 113 mg/dL — ABNORMAL HIGH (ref 65–99)
POTASSIUM: 4.7 mmol/L (ref 3.5–5.1)
SODIUM: 138 mmol/L (ref 135–145)

## 2016-09-07 LAB — HEMOGLOBIN A1C
HEMOGLOBIN A1C: 6.4 % — AB (ref 4.8–5.6)
MEAN PLASMA GLUCOSE: 137 mg/dL

## 2016-09-07 MED ORDER — POLYETHYLENE GLYCOL 3350 17 G PO PACK
17.0000 g | PACK | Freq: Every day | ORAL | Status: DC
  Administered 2016-09-07 – 2016-09-18 (×10): 17 g via ORAL
  Filled 2016-09-07 (×11): qty 1

## 2016-09-07 NOTE — Progress Notes (Signed)
PROGRESS NOTE    [name]  [id number] DOB: Jul 12, 1934 DOA: 09/05/2016 PCP: [name], PA   Chief Complaint  Patient presents with  . Urinary Retention     Brief Narrative:  On 09/05/2016 by Dr. Barton Dubois [name] is a 83 y.o. male with PMH significant for chronic diastolic HF, CKD stage, 3, obesity, LE lymphedema, CAD, HTN, atrial fibrillation (on eliquis), HLD and diabetes with nephropathy; who presented to ED from his SNF due to SOB, worsening LE edema and inability to urinate. Patient endorses been unable to pee despite lasix for the last 14 hours or so. Also has experienced SOB and orthopnea for the last 2-3 days, along with worsening on his bilateral LE edema (now unable to pick his left up on his own or to walk properly due to swelling). He denies CP, Fever, HA's, blurred vision, abd pain, dysuria, hematuria, melena, hematochezia or any other complaints. Assessment & Plan   Acute on chronic diastolic heart failure -previous echocardiogram noted grade 2 diastolic dysfunciton -Echocardiogram showed EF 55-60%, left ventricular diastolic function parameters are normal, left atrium was moderately to severely dilated -BNP on admission 557 -Chest x-ray showed small left pleural effusion with primary congestion -Continue IV Lasix -Monitor intake and output, daily weights -Urine output over past 24hrs: 1525cc -Weight in Nov 2017 245lbs, currently patient 274lbs -Currently no ACEi or ARB due to renal function  Acute renal failure superimposed onCKD, stage IV -due to poor perfusion from CHF exacerbation, plus component of urinary retention most likely from BPH -Creatinine upon admission 3.67, baseline 2.6-2.9 -Creatinine today 3.51 -Renal US showed no hydronephrosis -follow intake/output -If no improvement, will consult nephrology -Continue to monitor BMP closely as patient is diuresing   Anemia of chronic renal failure, stage 3  -Hgb 7.6 -no signs of acute  bleeding -will monitor trend and if Hgb less than 7 will need transfusion  -Continue to monitor CBC (may improve with diuresis)  Diabetes mellitus, type II with nephropathy  -Continue insulin size, CBG monitoring -Lantus and glipizide held -Hemoglobin A1c 6.4  Atrial fibrillation  -Continue Coreg for rate control -continue eliquis -CHADSVASC at least 4 (given age, h/o dCHF, DM, HTN)  Essential Hypertension -BP stable -Continue hydralazine, Lasix, Coreg, Imdur  CAD/Elevated troponin -no CP; but SOB could be equivalent to angina -troponin peaked at 0.04, 0.03. Currently flat, unlikely ACS.  -Possibly secondary to demand. -Echocardiogram as listed above -Continue coreg, lipitor and Imdur, apixaban  Hyperlipidemia -Continue statin  Acute urinary retention -as mentioned above; foley catheter placed while in ED -Continue flomax  Severe obesity  -Body mass index is 38.67 kg/m. -Will need to follow up with PCP upon discharge to discuss lifestyle modifications -Nutrition consulted  Deconditioning -Not sure where patient is from.  H&P noted SNF, social work/case management noted [facility].  -PT and OT consulted, recommended SNF  Lower extremity edema -Unna boots in place, wound care consulted  DVT Prophylaxis  Eliquis  Code Status: Full  Family Communication: None at bedside  Disposition Plan: Admitted. Continue diuresing, monitor BMP. Will need SNF once CHF and AKI improve.  Consultants None  Procedures  Echocardiogram Renal US  Antibiotics   Anti-infectives    None      Subjective:   [name] seen and examined today. Patient feels breathing is improved. Also feels his lower extremities are improving.  Patient does complain of buttock pain.  Denies chest pain, dizziness, headache, abdominal pain, nausea or vomiting.  Objective:   Vitals:  09/06/16 0515 09/06/16 1321 09/06/16 2036 09/07/16 0402  BP:  (!) 141/47 123/60 (!) 138/50  Pulse:   60 (!) 57 62  Resp:  16 16 14   Temp:  98.1 F (36.7 C) 98 F (36.7 C) 99.1 F (37.3 C)  TempSrc:  Oral Oral Oral  SpO2:  96% 100% 96%  Weight: 123.2 kg (271 lb 9.7 oz)   124.4 kg (274 lb 4 oz)  Height:        Intake/Output Summary (Last 24 hours) at 09/07/16 1028 Last data filed at 09/07/16 0955  Gross per 24 hour  Intake             1228 ml  Output             1525 ml  Net             -297 ml   Filed Weights   09/05/16 1818 09/06/16 0515 09/07/16 0402  Weight: 124 kg (273 lb 5.9 oz) 123.2 kg (271 lb 9.7 oz) 124.4 kg (274 lb 4 oz)    Exam  General: Well developed, well nourished, NAD, appears stated age  HEENT: NCAT,  mucous membranes moist.   Neck: Supple, + JVD, no masses  Cardiovascular: S1 S2 auscultated, no rubs, murmurs or gallops. irregular  Respiratory: Diminished but clear, no wheezing.   Abdomen: Soft, nontender, nondistended, + bowel sounds  Extremities: warm dry without cyanosis clubbing. LE in unna boots, +edema.  Neuro: AAOx3, nonfocal  Psych: Appropriate mood and affect   Data Reviewed: I have personally reviewed following labs and imaging studies  CBC:  Recent Labs Lab 09/05/16 0857 09/07/16 0601  WBC 13.1* 10.0  NEUTROABS 6.1  --   HGB 7.6* 7.0*  HCT 24.4* 22.3*  MCV 79.2 77.4*  PLT 131* 95*   Basic Metabolic Panel:  Recent Labs Lab 09/05/16 0857 09/06/16 0402 09/07/16 0601  NA 136 142 138  K 4.5 4.6 4.7  CL 107 113* 109  CO2 20* 21* 22  GLUCOSE 124* 112* 113*  BUN 62* 60* 58*  CREATININE 3.67* 3.65* 3.51*  CALCIUM 8.3* 8.2* 8.0*   GFR: Estimated Creatinine Clearance: 22.4 mL/min (by C-G formula based on SCr of 3.51 mg/dL (H)). Liver Function Tests: No results for input(s): AST, ALT, ALKPHOS, BILITOT, PROT, ALBUMIN in the last 168 hours. No results for input(s): LIPASE, AMYLASE in the last 168 hours. No results for input(s): AMMONIA in the last 168 hours. Coagulation Profile: No results for input(s): INR, PROTIME in  the last 168 hours. Cardiac Enzymes:  Recent Labs Lab 09/05/16 2211 09/06/16 0402 09/06/16 1052  TROPONINI 0.03* 0.04* 0.03*   BNP (last 3 results) No results for input(s): PROBNP in the last 8760 hours. HbA1C:  Recent Labs  09/06/16 0402  HGBA1C 6.4*   CBG:  Recent Labs Lab 09/06/16 0739 09/06/16 1145 09/06/16 1647 09/06/16 2040 09/07/16 0742  GLUCAP 107* 161* 139* 158* 110*   Lipid Profile: No results for input(s): CHOL, HDL, LDLCALC, TRIG, CHOLHDL, LDLDIRECT in the last 72 hours. Thyroid Function Tests:  Recent Labs  09/06/16 0402  TSH 1.175   Anemia Panel: No results for input(s): VITAMINB12, FOLATE, FERRITIN, TIBC, IRON, RETICCTPCT in the last 72 hours. Urine analysis:    Component Value Date/Time   COLORURINE YELLOW 09/05/2016 1301   APPEARANCEUR CLOUDY (A) 09/05/2016 1301   LABSPEC 1.014 09/05/2016 1301   PHURINE 5.0 09/05/2016 1301   GLUCOSEU NEGATIVE 09/05/2016 1301   HGBUR LARGE (A) 09/05/2016 1301   BILIRUBINUR  NEGATIVE 09/05/2016 1301   KETONESUR NEGATIVE 09/05/2016 1301   PROTEINUR 100 (A) 09/05/2016 1301   UROBILINOGEN 1.0 05/18/2015 1407   NITRITE NEGATIVE 09/05/2016 1301   LEUKOCYTESUR NEGATIVE 09/05/2016 1301   Sepsis Labs: @LABRCNTIP (procalcitonin:4,lacticidven:4)  ) Recent Results (from the past 240 hour(s))  MRSA PCR Screening     Status: None   Collection Time: 09/05/16 10:29 PM  Result Value Ref Range Status   MRSA by PCR NEGATIVE NEGATIVE Final    Comment:        The GeneXpert MRSA Assay (FDA approved for NASAL specimens only), is one component of a comprehensive MRSA colonization surveillance program. It is not intended to diagnose MRSA infection nor to guide or monitor treatment for MRSA infections.       Radiology Studies: Dg Chest 2 View  Result Date: 09/05/2016 CLINICAL DATA:  Shortness of Breath EXAM: CHEST  2 VIEW COMPARISON:  July 22, 2016 FINDINGS: There is a left pleural effusion with left base  atelectasis. Lungs elsewhere clear. There is cardiomegaly with pulmonary venous hypertension. There is atherosclerotic calcification in the aorta. No adenopathy. There is degenerative change in each shoulder. IMPRESSION: Small left pleural effusion with left base atelectasis. Lungs elsewhere clear. There is underlying pulmonary vascular congestion. There is aortic atherosclerosis. Electronically Signed   By: Lowella Grip III M.D.   On: 09/05/2016 11:07   US Renal  Result Date: 09/06/2016 CLINICAL DATA:  Acute kidney injury, rising BUN and creatinine EXAM: RENAL / URINARY TRACT ULTRASOUND COMPLETE COMPARISON:  11/28/2014 FINDINGS: Right Kidney: Length: 10.2 cm. Echogenicity within normal limits. Thinning of the renal cortex. No hydronephrosis or mass. Left Kidney: Length: 10.7 cm. Echogenicity within normal limits. Limited visualization of the left kidney. Hypoechoic area seen on the prior ultrasound is not visualized. Bladder: Foley catheter is present.  Bladder empty. IMPRESSION: 1. Suboptimal visualization of left kidney. 2. No hydronephrosis 3. With thinning of the renal cortex consistent . Electronically Signed   By: Donavan Foil M.D.   On: 09/06/2016 22:24     Scheduled Meds: . apixaban  2.5 mg Oral BID  . atorvastatin  20 mg Oral q1800  . carvedilol  6.25 mg Oral BID WC  . furosemide  40 mg Intravenous Q12H  . hydrALAZINE  50 mg Oral Q8H  . hydrocerin  1 application Topical Once per day on Tue Fri  . insulin aspart  0-5 Units Subcutaneous QHS  . insulin aspart  0-9 Units Subcutaneous TID WC  . isosorbide mononitrate  30 mg Oral Daily  . nystatin  1 g Topical BID  . polyethylene glycol  17 g Oral Daily  . sodium chloride flush  3 mL Intravenous Q12H  . tamsulosin  0.4 mg Oral QPC supper   Continuous Infusions:   LOS: 2 days   Time Spent in minutes   30 minutes  Yuette Putnam D.O. on 09/07/2016 at 10:28 AM  Between 7am to 7pm - Pager - [phone]  After 7pm go to  [email or web address] - password [email or web address]  And look for the night coverage person covering for me after hours  [facility] Office  [phone]

## 2016-09-08 LAB — BASIC METABOLIC PANEL
Anion gap: 7 (ref 5–15)
BUN: 56 mg/dL — AB (ref 6–20)
CALCIUM: 8.1 mg/dL — AB (ref 8.9–10.3)
CO2: 21 mmol/L — ABNORMAL LOW (ref 22–32)
CREATININE: 3.4 mg/dL — AB (ref 0.61–1.24)
Chloride: 110 mmol/L (ref 101–111)
GFR calc Af Amer: 18 mL/min — ABNORMAL LOW (ref >60)
GFR, EST NON AFRICAN AMERICAN: 16 mL/min — AB (ref >60)
GLUCOSE: 116 mg/dL — AB (ref 65–99)
Potassium: 4.9 mmol/L (ref 3.5–5.1)
SODIUM: 138 mmol/L (ref 135–145)

## 2016-09-08 LAB — CBC
HEMATOCRIT: 23.2 % — AB (ref 39.0–52.0)
Hemoglobin: 7.3 g/dL — ABNORMAL LOW (ref 13.0–17.0)
MCH: 24.6 pg — ABNORMAL LOW (ref 26.0–34.0)
MCHC: 31.5 g/dL (ref 30.0–36.0)
MCV: 78.1 fL (ref 78.0–100.0)
PLATELETS: 106 10*3/uL — AB (ref 150–400)
RBC: 2.97 MIL/uL — ABNORMAL LOW (ref 4.22–5.81)
RDW: 17.1 % — AB (ref 11.5–15.5)
WBC: 10.1 10*3/uL (ref 4.0–10.5)

## 2016-09-08 LAB — GLUCOSE, CAPILLARY
GLUCOSE-CAPILLARY: 128 mg/dL — AB (ref 65–99)
GLUCOSE-CAPILLARY: 137 mg/dL — AB (ref 65–99)
Glucose-Capillary: 134 mg/dL — ABNORMAL HIGH (ref 65–99)
Glucose-Capillary: 146 mg/dL — ABNORMAL HIGH (ref 65–99)

## 2016-09-08 MED ORDER — IPRATROPIUM-ALBUTEROL 0.5-2.5 (3) MG/3ML IN SOLN
3.0000 mL | Freq: Four times a day (QID) | RESPIRATORY_TRACT | Status: DC
  Administered 2016-09-08 (×2): 3 mL via RESPIRATORY_TRACT
  Filled 2016-09-08 (×2): qty 3

## 2016-09-08 MED ORDER — FUROSEMIDE 10 MG/ML IJ SOLN
80.0000 mg | Freq: Three times a day (TID) | INTRAMUSCULAR | Status: DC
  Administered 2016-09-08 – 2016-09-10 (×6): 80 mg via INTRAVENOUS
  Filled 2016-09-08 (×6): qty 8

## 2016-09-08 MED ORDER — IPRATROPIUM-ALBUTEROL 0.5-2.5 (3) MG/3ML IN SOLN
3.0000 mL | Freq: Three times a day (TID) | RESPIRATORY_TRACT | Status: DC
  Administered 2016-09-09 – 2016-09-12 (×6): 3 mL via RESPIRATORY_TRACT
  Filled 2016-09-08 (×12): qty 3

## 2016-09-08 NOTE — Progress Notes (Signed)
PROGRESS NOTE    [name]  [id number] DOB: 09/03/1933 DOA: 09/05/2016 PCP: [name], PA   Chief Complaint  Patient presents with  . Urinary Retention     Brief Narrative:  On 09/05/2016 by Dr. Barton Dubois [name] is a 83 y.o. male with PMH significant for chronic diastolic HF, CKD stage, 3, obesity, LE lymphedema, CAD, HTN, atrial fibrillation (on eliquis), HLD and diabetes with nephropathy; who presented to ED from his SNF due to SOB, worsening LE edema and inability to urinate. Patient endorses been unable to pee despite lasix for the last 14 hours or so. Also has experienced SOB and orthopnea for the last 2-3 days, along with worsening on his bilateral LE edema (now unable to pick his left up on his own or to walk properly due to swelling). He denies CP, Fever, HA's, blurred vision, abd pain, dysuria, hematuria, melena, hematochezia or any other complaints. Assessment & Plan   Possible Acute on chronic diastolic heart failure -previous echocardiogram noted grade 2 diastolic dysfunction (Q000111Q) and systolic dysfunction (0000000) -Echocardiogram showed EF 55-60%, left ventricular diastolic function parameters are normal, left atrium was moderately to severely dilated -Not sure if patient's SOB and LE edema is due to CHF exacerbation vs undiagnosed sleep apnea? -BNP on admission 557 -Chest x-ray showed small left pleural effusion with primary congestion -Continue IV Lasix- will increase dose -Monitor intake and output, daily weights -Urine output over past 24hrs: 1525cc -Weight in Nov 2017 245lbs, currently 280lbs (up 6lbs in one day). Not sure that weights are accurate.   -Currently no ACEi or ARB due to renal function -Will repeat CXR today  Acute renal failure superimposed onCKD, stage IV -due to poor perfusion from CHF exacerbation, plus component of urinary retention most likely from BPH -Creatinine upon admission 3.67, baseline 2.6-2.9 -Creatinine today  3.40 -Renal US showed no hydronephrosis -follow intake/output -Spoke with Dr. Jonnie Finner, nephrology, suggested increasing lasix to 80mg  IV TID and watching. -Continue to monitor BMP closely as patient is diuresing   Anemia of chronic renal failure, stage 3  -Hgb 7.3 -no signs of acute bleeding -will monitor trend and if Hgb less than 7 will need transfusion  -Continue to monitor CBC (may improve with diuresis)  Diabetes mellitus, type II with nephropathy  -Continue insulin size, CBG monitoring -Lantus and glipizide held -Hemoglobin A1c 6.4  Atrial fibrillation  -Continue Coreg for rate control -continue eliquis -CHADSVASC at least 4 (given age, h/o dCHF, DM, HTN)  Essential Hypertension -BP stable -Continue hydralazine, Lasix, Coreg, Imdur  CAD/Elevated troponin -no CP; but SOB could be equivalent to angina -troponin peaked at 0.04, 0.03. Currently flat, unlikely ACS.  -Possibly secondary to demand. -Echocardiogram as listed above -Continue coreg, lipitor and Imdur, apixaban  Hyperlipidemia -Continue statin  Acute urinary retention -as mentioned above; foley catheter placed while in ED -Continue flomax  Severe obesity  -Body mass index is 38.67 kg/m. -Will need to follow up with PCP upon discharge to discuss lifestyle modifications -Nutrition consulted -patient should have a sleep study as an outpateint  Deconditioning -Not sure where patient is from.  H&P noted SNF, social work/case management noted [facility].  -PT and OT consulted, recommended SNF  Lower extremity edema -Unna boots in place, wound care consulted  DVT Prophylaxis  Eliquis  Code Status: Full  Family Communication: None at bedside  Disposition Plan: Admitted. Continue diuresing, monitor BMP. Will need SNF once CHF and AKI improve.  Consultants None  Procedures  Echocardiogram Renal US  Antibiotics   Anti-infectives    None      Subjective:   [name] seen and  examined today. Patient feels breathing is improved but complains of  Upset that his lower extremities are not less swollen today.  Denies chest pain, dizziness, headache, abdominal pain, nausea or vomiting.  Objective:   Vitals:   09/07/16 1459 09/07/16 2130 09/08/16 0543 09/08/16 0822  BP: (!) 139/54 (!) 126/50 (!) 150/59 (!) 158/56  Pulse: 62 61 (!) 55 63  Resp: 18 18 16    Temp: 98.3 F (36.8 C) 99.3 F (37.4 C) 98.4 F (36.9 C)   TempSrc: Oral Oral Oral   SpO2: 95% 98% 97% 98%  Weight:   127.2 kg (280 lb 6.8 oz)   Height:        Intake/Output Summary (Last 24 hours) at 09/08/16 1153 Last data filed at 09/08/16 0602  Gross per 24 hour  Intake              490 ml  Output             1775 ml  Net            -1285 ml   Filed Weights   09/06/16 0515 09/07/16 0402 09/08/16 0543  Weight: 123.2 kg (271 lb 9.7 oz) 124.4 kg (274 lb 4 oz) 127.2 kg (280 lb 6.8 oz)    Exam  General: Well developed, well nourished, NAD, appears stated age  HEENT: NCAT,  mucous membranes moist.   Neck: Supple, + JVD, no masses  Cardiovascular: S1 S2 auscultated, no rubs, murmurs or gallops. irregular  Respiratory: Diminished but clear, mild exp wheezing anteriorly  Abdomen: Soft, nontender, nondistended, + bowel sounds  Extremities: warm dry without cyanosis clubbing. LE in unna boots, +edema.  Skin: chronic venous stasis skin changes on lower extremities   Neuro: AAOx3, nonfocal  Psych: Upset this morning. Not sure patient has insight into his medical condition.    Data Reviewed: I have personally reviewed following labs and imaging studies  CBC:  Recent Labs Lab 09/05/16 0857 09/07/16 0601 09/08/16 0532  WBC 13.1* 10.0 10.1  NEUTROABS 6.1  --   --   HGB 7.6* 7.0* 7.3*  HCT 24.4* 22.3* 23.2*  MCV 79.2 77.4* 78.1  PLT 131* 95* [id number]*   Basic Metabolic Panel:  Recent Labs Lab 09/05/16 0857 09/06/16 0402 09/07/16 0601 09/08/16 0532  NA 136 142 138 138  K 4.5 4.6 4.7 4.9   CL 107 113* 109 110  CO2 20* 21* 22 21*  GLUCOSE 124* 112* 113* 116*  BUN 62* 60* 58* 56*  CREATININE 3.67* 3.65* 3.51* 3.40*  CALCIUM 8.3* 8.2* 8.0* 8.1*   GFR: Estimated Creatinine Clearance: 23.4 mL/min (by C-G formula based on SCr of 3.4 mg/dL (H)). Liver Function Tests: No results for input(s): AST, ALT, ALKPHOS, BILITOT, PROT, ALBUMIN in the last 168 hours. No results for input(s): LIPASE, AMYLASE in the last 168 hours. No results for input(s): AMMONIA in the last 168 hours. Coagulation Profile: No results for input(s): INR, PROTIME in the last 168 hours. Cardiac Enzymes:  Recent Labs Lab 09/05/16 2211 09/06/16 0402 09/06/16 1052  TROPONINI 0.03* 0.04* 0.03*   BNP (last 3 results) No results for input(s): PROBNP in the last 8760 hours. HbA1C:  Recent Labs  09/06/16 0402  HGBA1C 6.4*   CBG:  Recent Labs Lab 09/07/16 0742 09/07/16 1205 09/07/16 1747 09/07/16 2128 09/08/16 0729  GLUCAP 110* 145* 174* 190* 128*  Lipid Profile: No results for input(s): CHOL, HDL, LDLCALC, TRIG, CHOLHDL, LDLDIRECT in the last 72 hours. Thyroid Function Tests:  Recent Labs  09/06/16 0402  TSH 1.175   Anemia Panel: No results for input(s): VITAMINB12, FOLATE, FERRITIN, TIBC, IRON, RETICCTPCT in the last 72 hours. Urine analysis:    Component Value Date/Time   COLORURINE YELLOW 09/05/2016 1301   APPEARANCEUR CLOUDY (A) 09/05/2016 1301   LABSPEC 1.014 09/05/2016 1301   PHURINE 5.0 09/05/2016 1301   GLUCOSEU NEGATIVE 09/05/2016 1301   HGBUR LARGE (A) 09/05/2016 1301   BILIRUBINUR NEGATIVE 09/05/2016 1301   KETONESUR NEGATIVE 09/05/2016 1301   PROTEINUR 100 (A) 09/05/2016 1301   UROBILINOGEN 1.0 05/18/2015 1407   NITRITE NEGATIVE 09/05/2016 1301   LEUKOCYTESUR NEGATIVE 09/05/2016 1301   Sepsis Labs: @LABRCNTIP (procalcitonin:4,lacticidven:4)  ) Recent Results (from the past 240 hour(s))  MRSA PCR Screening     Status: None   Collection Time: 09/05/16 10:29 PM    Result Value Ref Range Status   MRSA by PCR NEGATIVE NEGATIVE Final    Comment:        The GeneXpert MRSA Assay (FDA approved for NASAL specimens only), is one component of a comprehensive MRSA colonization surveillance program. It is not intended to diagnose MRSA infection nor to guide or monitor treatment for MRSA infections.       Radiology Studies: US Renal  Result Date: 09/06/2016 CLINICAL DATA:  Acute kidney injury, rising BUN and creatinine EXAM: RENAL / URINARY TRACT ULTRASOUND COMPLETE COMPARISON:  11/28/2014 FINDINGS: Right Kidney: Length: 10.2 cm. Echogenicity within normal limits. Thinning of the renal cortex. No hydronephrosis or mass. Left Kidney: Length: 10.7 cm. Echogenicity within normal limits. Limited visualization of the left kidney. Hypoechoic area seen on the prior ultrasound is not visualized. Bladder: Foley catheter is present.  Bladder empty. IMPRESSION: 1. Suboptimal visualization of left kidney. 2. No hydronephrosis 3. With thinning of the renal cortex consistent . Electronically Signed   By: Donavan Foil M.D.   On: 09/06/2016 22:24     Scheduled Meds: . apixaban  2.5 mg Oral BID  . atorvastatin  20 mg Oral q1800  . carvedilol  6.25 mg Oral BID WC  . furosemide  80 mg Intravenous Q8H  . hydrALAZINE  50 mg Oral Q8H  . hydrocerin  1 application Topical Once per day on Tue Fri  . insulin aspart  0-5 Units Subcutaneous QHS  . insulin aspart  0-9 Units Subcutaneous TID WC  . ipratropium-albuterol  3 mL Nebulization Q6H  . isosorbide mononitrate  30 mg Oral Daily  . nystatin  1 g Topical BID  . polyethylene glycol  17 g Oral Daily  . sodium chloride flush  3 mL Intravenous Q12H  . tamsulosin  0.4 mg Oral QPC supper   Continuous Infusions:   LOS: 3 days   Time Spent in minutes   30 minutes  Gildo Crisco D.O. on 09/08/2016 at 11:53 AM  Between 7am to 7pm - Pager - [phone]  After 7pm go to [email or web address] - password [email or web address]  And look for the  night coverage person covering for me after hours  [facility] Office  [phone]

## 2016-09-08 NOTE — Clinical Social Work Note (Signed)
Clinical Social Work Assessment  Patient Details  Name: [name] MRN: [id number] Date of Birth: 1934/07/28  Date of referral:  09/08/16               Reason for consult:  Facility Placement, Discharge Planning                Permission sought to share information with:  Facility Sport and exercise psychologist, Family Supports Permission granted to share information::  Yes, Verbal Permission Granted  Name::     Human resources officer::  SNFs  Relationship::  Daughter  Contact Information:     Housing/Transportation Living arrangements for the past 2 months:  Hotel/Motel Source of Information:  Patient Patient Interpreter Needed:  None Criminal Activity/Legal Involvement Pertinent to Current Situation/Hospitalization:  No - Comment as needed Significant Relationships:  Siblings, Adult Children Lives with:  Self Do you feel safe going back to the place where you live?  Yes Need for family participation in patient care:  Yes (Comment)  Care giving concerns:  The patient does not report any concerns at this time. He states that he is fine with going to SNF at discharge but would like to go to [facility].   Social Worker assessment / plan:  CSW met with the patient at bedside to complete assessment. The patient shares that he does not know the name of the place he was staying at prior to admission, but states that it was a hotel. The patient states he has been in and out of SNFs and hotels. The patient shares that he is agreeable to SNF placement at time of discharge if needed. The patient states he usually goes to [facility] and would like this facility. CSW explained SNF search/placement process and answered the patient's questions. CSW explained that the patient's insurance is accepted by very few SNFs in the area, and that this will limit his SNF options significantly. CSW will followup with bed offers.  Employment status:  Unemployed, Disabled (Comment on whether or not currently receiving  Disability) Insurance information:  Programmer, applications PT Recommendations:  [place] / Referral to community resources:  [place]  Patient/Family's Response to care:  The patient appears content with the care he has received. He appreciates CSW's assistance with placement.  Patient/Family's Understanding of and Emotional Response to Diagnosis, Current Treatment, and Prognosis:  The patient seems to have a limited understanding of his medical condition and post discharge needs. The patient seems pre-occupied with having his sister call him.  Emotional Assessment Appearance:  Appears stated age Attitude/Demeanor/Rapport:  Other (The patient is appropriate with CSW) Affect (typically observed):  Accepting, Calm, Appropriate Orientation:  Oriented to Self, Oriented to Place, Oriented to  Time, Oriented to Situation Alcohol / Substance use:  Not Applicable Psych involvement (Current and /or in the community):  No (Comment)  Discharge Needs  Concerns to be addressed:  Care Coordination, Discharge Planning Concerns Readmission within the last 30 days:  No Current discharge risk:  Physical Impairment, Lives alone Barriers to Discharge:  Continued Medical Work up   Rigoberto Noel, LCSW 09/08/2016, 2:22 PM

## 2016-09-08 NOTE — Care Management Important Message (Signed)
Important Message  Patient Details  Name: [name] MRN: [id number] Date of Birth: 1934-01-05   Medicare Important Message Given:  Yes    Apolonio Schneiders, RN 09/08/2016, 12:33 PM

## 2016-09-09 ENCOUNTER — Inpatient Hospital Stay (HOSPITAL_COMMUNITY): Payer: Medicare HMO

## 2016-09-09 LAB — BASIC METABOLIC PANEL
Anion gap: 10 (ref 5–15)
BUN: 56 mg/dL — ABNORMAL HIGH (ref 6–20)
CALCIUM: 8.1 mg/dL — AB (ref 8.9–10.3)
CO2: 22 mmol/L (ref 22–32)
CREATININE: 3.2 mg/dL — AB (ref 0.61–1.24)
Chloride: 108 mmol/L (ref 101–111)
GFR calc non Af Amer: 17 mL/min — ABNORMAL LOW (ref >60)
GFR, EST AFRICAN AMERICAN: 20 mL/min — AB (ref >60)
Glucose, Bld: 128 mg/dL — ABNORMAL HIGH (ref 65–99)
Potassium: 4.8 mmol/L (ref 3.5–5.1)
SODIUM: 140 mmol/L (ref 135–145)

## 2016-09-09 LAB — CBC
HCT: 24.1 % — ABNORMAL LOW (ref 39.0–52.0)
Hemoglobin: 7.4 g/dL — ABNORMAL LOW (ref 13.0–17.0)
MCH: 23.9 pg — ABNORMAL LOW (ref 26.0–34.0)
MCHC: 30.7 g/dL (ref 30.0–36.0)
MCV: 78 fL (ref 78.0–100.0)
Platelets: 95 10*3/uL — ABNORMAL LOW (ref 150–400)
RBC: 3.09 MIL/uL — ABNORMAL LOW (ref 4.22–5.81)
RDW: 17.1 % — ABNORMAL HIGH (ref 11.5–15.5)
WBC: 10.4 10*3/uL (ref 4.0–10.5)

## 2016-09-09 LAB — GLUCOSE, CAPILLARY
GLUCOSE-CAPILLARY: 137 mg/dL — AB (ref 65–99)
GLUCOSE-CAPILLARY: 176 mg/dL — AB (ref 65–99)
GLUCOSE-CAPILLARY: 165 mg/dL — AB (ref 65–99)
Glucose-Capillary: 144 mg/dL — ABNORMAL HIGH (ref 65–99)

## 2016-09-09 LAB — TROPONIN I

## 2016-09-09 MED ORDER — CARVEDILOL 12.5 MG PO TABS
12.5000 mg | ORAL_TABLET | Freq: Two times a day (BID) | ORAL | Status: DC
  Administered 2016-09-09 – 2016-09-10 (×2): 12.5 mg via ORAL
  Filled 2016-09-09 (×2): qty 1

## 2016-09-09 MED ORDER — BISACODYL 5 MG PO TBEC
5.0000 mg | DELAYED_RELEASE_TABLET | Freq: Every day | ORAL | Status: DC | PRN
  Administered 2016-09-09 – 2016-09-10 (×2): 5 mg via ORAL
  Filled 2016-09-09 (×2): qty 1

## 2016-09-09 MED ORDER — GI COCKTAIL ~~LOC~~
30.0000 mL | Freq: Three times a day (TID) | ORAL | Status: DC | PRN
  Administered 2016-09-09: 30 mL via ORAL
  Filled 2016-09-09: qty 30

## 2016-09-09 NOTE — Progress Notes (Signed)
PROGRESS NOTE    [name]  [id number] DOB: 04-26-1934 DOA: 09/05/2016 PCP: [name], PA   Chief Complaint  Patient presents with  . Urinary Retention     Brief Narrative:  On 09/05/2016 by Dr. Barton Dubois Herny Hewes is a 83 y.o. male with PMH significant for chronic diastolic HF, CKD stage, 3, obesity, LE lymphedema, CAD, HTN, atrial fibrillation (on eliquis), HLD and diabetes with nephropathy; who presented to ED from his SNF due to SOB, worsening LE edema and inability to urinate. Patient endorses been unable to pee despite lasix for the last 14 hours or so. Also has experienced SOB and orthopnea for the last 2-3 days, along with worsening on his bilateral LE edema (now unable to pick his left up on his own or to walk properly due to swelling). He denies CP, Fever, HA's, blurred vision, abd pain, dysuria, hematuria, melena, hematochezia or any other complaints. Assessment & Plan   Possible Acute on chronic diastolic heart failure -previous echocardiogram noted grade 2 diastolic dysfunction ([id number]) and systolic dysfunction (0000000) -Echocardiogram showed EF 55-60%, left ventricular diastolic function parameters are normal, left atrium was moderately to severely dilated -Not sure if patient's SOB and LE edema is due to CHF exacerbation vs undiagnosed sleep apnea? -BNP on admission 557 -Chest x-ray showed small left pleural effusion with primary congestion -Continue IV Lasix- will increase dose -Monitor intake and output, daily weights -Urine output over past 24hrs: 3000cc -Weight in Nov 2017 245lbs, currently 269lbs. Not sure that weights are accurate.  Have spoken to RN regarding this. -Currently no ACEi or ARB due to renal function  Acute renal failure superimposed onCKD, stage IV -due to poor perfusion from CHF exacerbation, plus component of urinary retention most likely from BPH -Creatinine upon admission 3.67, baseline 2.6-2.9 -Creatinine today 3.20 -Renal US  showed no hydronephrosis -follow intake/output -Spoke with Dr. Jonnie Finner, nephrology, suggested increasing lasix to 80mg  IV TID and watching. -Continue to monitor BMP closely as patient is diuresing   Anemia of chronic renal failure, stage 3  -Hgb 7.3 -no signs of acute bleeding -will monitor trend and if Hgb less than 7 will need transfusion  -Continue to monitor CBC (may improve with diuresis)  Diabetes mellitus, type II with nephropathy  -Continue insulin size, CBG monitoring -Lantus and glipizide held -Hemoglobin A1c 6.4  Atrial fibrillation  -Continue Coreg for rate control -continue eliquis -CHADSVASC at least 4 (given age, h/o dCHF, DM, HTN)  Essential Hypertension -BP stable -Continue hydralazine, Lasix, Coreg, Imdur  CAD/Elevated troponin -no CP; but SOB could be equivalent to angina -troponin peaked at 0.04, 0.03. Currently flat, unlikely ACS.  -Possibly secondary to demand. -Echocardiogram as listed above -Continue coreg, lipitor and Imdur, apixaban  Hyperlipidemia -Continue statin  Acute urinary retention -as mentioned above; foley catheter placed while in ED -Continue flomax  Severe obesity  -Body mass index is 38.67 kg/m. -Will need to follow up with PCP upon discharge to discuss lifestyle modifications -Nutrition consulted -patient should have a sleep study as an outpateint  Deconditioning -Not sure where patient is from.  H&P noted SNF, social work/case management noted Hotel.  -PT and OT consulted, recommended SNF  Lower extremity edema -Unna boots in place, wound care consulted  DVT Prophylaxis  Eliquis  Code Status: Full  Family Communication: None at bedside  Disposition Plan: Admitted. Continue diuresing, monitor BMP. Will need SNF once CHF and AKI improve.  Consultants None  Procedures  Echocardiogram Renal US  Antibiotics   Anti-infectives  None      Subjective:   [name] seen and examined today.  Patient feels breathing is improved but still appears to be upset about his legs. Denies chest pain, dizziness, headache, abdominal pain, nausea or vomiting.  Objective:   Vitals:   09/08/16 2043 09/08/16 2045 09/08/16 2131 09/09/16 0540  BP:  (!) 131/51 137/63 134/68  Pulse:  (!) 59  60  Resp:  18  20  Temp:  97.9 F (36.6 C)  99.6 F (37.6 C)  TempSrc:  Axillary  Oral  SpO2: 94% 100%  97%  Weight:    122.3 kg (269 lb 10 oz)  Height:        Intake/Output Summary (Last 24 hours) at 09/09/16 1207 Last data filed at 09/09/16 0550  Gross per 24 hour  Intake              480 ml  Output             3000 ml  Net            -2520 ml   Filed Weights   09/08/16 0543 09/08/16 1201 09/09/16 0540  Weight: 127.2 kg (280 lb 6.8 oz) 120.5 kg (265 lb 10.5 oz) 122.3 kg (269 lb 10 oz)    Exam  General: Well developed, well nourished, NAD, appears stated age  HEENT: NCAT,  mucous membranes moist.   Cardiovascular: S1 S2 auscultated, no rubs, murmurs or gallops. irregular  Respiratory: Diminished but clear  Abdomen: Soft, nontender, nondistended, + bowel sounds  Extremities: warm dry without cyanosis clubbing. LE in unna boots, +edema, improving.  Skin: chronic venous stasis skin changes on lower extremities   Neuro: AAOx3, nonfocal  Psych: Flat   Data Reviewed: I have personally reviewed following labs and imaging studies  CBC:  Recent Labs Lab 09/05/16 0857 09/07/16 0601 09/08/16 0532 09/09/16 0546  WBC 13.1* 10.0 10.1 10.4  NEUTROABS 6.1  --   --   --   HGB 7.6* 7.0* 7.3* 7.4*  HCT 24.4* 22.3* 23.2* 24.1*  MCV 79.2 77.4* 78.1 78.0  PLT 131* 95* 106* 95*   Basic Metabolic Panel:  Recent Labs Lab 09/05/16 0857 09/06/16 0402 09/07/16 0601 09/08/16 0532 09/09/16 0546  NA 136 142 138 138 140  K 4.5 4.6 4.7 4.9 4.8  CL 107 113* 109 110 108  CO2 20* 21* 22 21* 22  GLUCOSE 124* 112* 113* 116* 128*  BUN 62* 60* 58* 56* 56*  CREATININE 3.67* 3.65* 3.51* 3.40*  3.20*  CALCIUM 8.3* 8.2* 8.0* 8.1* 8.1*   GFR: Estimated Creatinine Clearance: 24.3 mL/min (by C-G formula based on SCr of 3.2 mg/dL (H)). Liver Function Tests: No results for input(s): AST, ALT, ALKPHOS, BILITOT, PROT, ALBUMIN in the last 168 hours. No results for input(s): LIPASE, AMYLASE in the last 168 hours. No results for input(s): AMMONIA in the last 168 hours. Coagulation Profile: No results for input(s): INR, PROTIME in the last 168 hours. Cardiac Enzymes:  Recent Labs Lab 09/05/16 2211 09/06/16 0402 09/06/16 1052  TROPONINI 0.03* 0.04* 0.03*   BNP (last 3 results) No results for input(s): PROBNP in the last 8760 hours. HbA1C: No results for input(s): HGBA1C in the last 72 hours. CBG:  Recent Labs Lab 09/08/16 1213 09/08/16 1650 09/08/16 2040 09/09/16 0738 09/09/16 1152  GLUCAP 137* 134* 146* 137* 176*   Lipid Profile: No results for input(s): CHOL, HDL, LDLCALC, TRIG, CHOLHDL, LDLDIRECT in the last 72 hours. Thyroid Function Tests: No results  for input(s): TSH, T4TOTAL, FREET4, T3FREE, THYROIDAB in the last 72 hours. Anemia Panel: No results for input(s): VITAMINB12, FOLATE, FERRITIN, TIBC, IRON, RETICCTPCT in the last 72 hours. Urine analysis:    Component Value Date/Time   COLORURINE YELLOW 09/05/2016 1301   APPEARANCEUR CLOUDY (A) 09/05/2016 1301   LABSPEC 1.014 09/05/2016 1301   PHURINE 5.0 09/05/2016 1301   GLUCOSEU NEGATIVE 09/05/2016 1301   HGBUR LARGE (A) 09/05/2016 1301   BILIRUBINUR NEGATIVE 09/05/2016 1301   KETONESUR NEGATIVE 09/05/2016 1301   PROTEINUR 100 (A) 09/05/2016 1301   UROBILINOGEN 1.0 05/18/2015 1407   NITRITE NEGATIVE 09/05/2016 1301   LEUKOCYTESUR NEGATIVE 09/05/2016 1301   Sepsis Labs: @LABRCNTIP (procalcitonin:4,lacticidven:4)  ) Recent Results (from the past 240 hour(s))  MRSA PCR Screening     Status: None   Collection Time: 09/05/16 10:29 PM  Result Value Ref Range Status   MRSA by PCR NEGATIVE NEGATIVE Final     Comment:        The GeneXpert MRSA Assay (FDA approved for NASAL specimens only), is one component of a comprehensive MRSA colonization surveillance program. It is not intended to diagnose MRSA infection nor to guide or monitor treatment for MRSA infections.       Radiology Studies: Dg Chest 2 View  Result Date: 09/09/2016 CLINICAL DATA:  Shortness of breath. EXAM: CHEST  2 VIEW COMPARISON:  09/05/2016. FINDINGS: Cardiomegaly with pulmonary vascular prominence and bilateral interstitial prominence with bilateral pleural effusions. Findings consistent with congestive heart failure. No pneumothorax. IMPRESSION: Congestive heart failure with bilateral pulmonary interstitial edema and bilateral pleural effusions. Similar findings noted on prior exam. Electronically Signed   By: [place]   On: 09/09/2016 09:27     Scheduled Meds: . apixaban  2.5 mg Oral BID  . atorvastatin  20 mg Oral q1800  . carvedilol  6.25 mg Oral BID WC  . furosemide  80 mg Intravenous Q8H  . hydrALAZINE  50 mg Oral Q8H  . hydrocerin  1 application Topical Once per day on Tue Fri  . insulin aspart  0-5 Units Subcutaneous QHS  . insulin aspart  0-9 Units Subcutaneous TID WC  . ipratropium-albuterol  3 mL Nebulization TID  . isosorbide mononitrate  30 mg Oral Daily  . nystatin  1 g Topical BID  . polyethylene glycol  17 g Oral Daily  . sodium chloride flush  3 mL Intravenous Q12H  . tamsulosin  0.4 mg Oral QPC supper   Continuous Infusions:   LOS: 4 days   Time Spent in minutes   30 minutes  Jalia Zuniga D.O. on 09/09/2016 at 12:07 PM  Between 7am to 7pm - Pager - [phone]  After 7pm go to [email or web address] - password [email or web address]  And look for the night coverage person covering for me after hours  [facility] Office  [phone]

## 2016-09-09 NOTE — NC FL2 (Signed)
[place] LEVEL OF CARE SCREENING TOOL     IDENTIFICATION  Patient Name: [name] Birthdate: October 30, 1933 Sex: male Admission Date (Current Location): 09/05/2016  [facility] and [place] Number:  Herbalist and Address:  [facility],  [place] [address] [place]      Provider Number: [id number]  Attending Physician Name and Address:  Cristal Ford, DO  Relative Name and Phone Number:       Current Level of Care: Hospital Recommended Level of Care: [place] Prior Approval Number:    Date Approved/Denied:   PASRR Number: [id number]  Discharge Plan: SNF    Current Diagnoses: Patient Active Problem List   Diagnosis Date Noted  . AKI (acute kidney injury) ([place]) 09/05/2016  . HTN (hypertension) 09/05/2016  . Acute urinary retention 09/05/2016  . Severe obesity (BMI >= 40) ([place]) 09/05/2016  . Acute on chronic diastolic congestive heart failure ([place]) 09/05/2016  . Acute CVA (cerebrovascular accident) ([place]) 12/09/2015  . Bradycardia 12/08/2015  . Fall 12/08/2015  . Anemia 12/08/2015  . Chronic kidney disease   . Atrial flutter ([place]) 08/29/2015  . Acute respiratory failure ([place]) 08/29/2015  . Acute on chronic diastolic CHF (congestive heart failure) ([place]) 08/28/2015  . Hypoxia 08/28/2015  . Atrial fibrillation ([place])   . Diabetes mellitus without complication ([place])   . Diabetes mellitus with complication ([place])   . Noncompliance with medications 08/03/2015  . Chronic diastolic CHF (congestive heart failure) ([place]) 07/31/2015  . Cellulitis of left lower extremity 07/31/2015  . Anemia of chronic renal failure, stage 3 (moderate) 07/31/2015  . Uncontrolled diabetes mellitus with diabetic nephropathy, with long-term current use of insulin ([place]) 07/31/2015  . Accelerated hypertension 07/31/2015  . Thrombocytopenia ([place]) 07/31/2015  . Acute renal failure superimposed on stage 3 chronic kidney disease ([place])  11/29/2014  . Lymphedema 06/25/2011    Orientation RESPIRATION BLADDER Height & Weight     Self, Time, Place  Normal Incontinent, Indwelling catheter Weight: 269 lb 10 oz (122.3 kg) Height:  5' 10.5" (179.1 cm)  BEHAVIORAL SYMPTOMS/MOOD NEUROLOGICAL BOWEL NUTRITION STATUS      Continent Diet (Heart)  AMBULATORY STATUS COMMUNICATION OF NEEDS Skin   Extensive Assist Verbally Surgical wounds (Wound / Incision (Open or Dehisced) 08/28/15 Diabetic ulcer Leg Right)                       Personal Care Assistance Level of Assistance  Bathing, Dressing Bathing Assistance: Limited assistance   Dressing Assistance: Limited assistance     Functional Limitations Info             [place]  PT (By licensed PT), OT (By licensed OT)     PT Frequency: 5 OT Frequency: 5            Contractures      Additional Factors Info  Code Status, Allergies Code Status Info: Fullcode Allergies Info: Metformin And Related           Current Medications (09/09/2016):  This is the current hospital active medication list Current Facility-Administered Medications  Medication Dose Route Frequency Provider Last Rate Last Dose  . 0.9 %  sodium chloride infusion  250 mL Intravenous PRN Barton Dubois, MD      . acetaminophen (TYLENOL) tablet 650 mg  650 mg Oral Q4H PRN Barton Dubois, MD   650 mg at 09/06/16 0030  . apixaban (ELIQUIS) tablet 2.5 mg  2.5 mg Oral BID  Barton Dubois, MD   2.5 mg at 09/08/16 2131  . atorvastatin (LIPITOR) tablet 20 mg  20 mg Oral q1800 Barton Dubois, MD   20 mg at 09/08/16 1812  . carvedilol (COREG) tablet 6.25 mg  6.25 mg Oral BID WC Barton Dubois, MD   6.25 mg at 09/09/16 0804  . furosemide (LASIX) injection 80 mg  80 mg Intravenous Q8H Maryann Mikhail, DO   80 mg at 09/09/16 [id number]  . hydrALAZINE (APRESOLINE) tablet 50 mg  50 mg Oral Q8H Barton Dubois, MD   50 mg at 09/09/16 0645  . hydrocerin (EUCERIN) cream 1 application  1 application  Topical Once per day on Tue Fri [facility], DO      . HYDROcodone-acetaminophen (NORCO/VICODIN) 5-325 MG per tablet 1-2 tablet  1-2 tablet Oral Q4H PRN Jeryl [place], NP   1 tablet at 09/08/16 2131  . insulin aspart (novoLOG) injection 0-5 Units  0-5 Units Subcutaneous QHS Barton Dubois, MD      . insulin aspart (novoLOG) injection 0-9 Units  0-9 Units Subcutaneous TID WC Barton Dubois, MD   1 Units at 09/09/16 0804  . ipratropium-albuterol (DUONEB) 0.5-2.5 (3) MG/3ML nebulizer solution 3 mL  3 mL Nebulization TID Maryann Mikhail, DO      . isosorbide mononitrate (IMDUR) 24 hr tablet 30 mg  30 mg Oral Daily Barton Dubois, MD   30 mg at 09/08/16 0929  . nystatin (MYCOSTATIN/NYSTOP) topical powder 1 g  1 g Topical BID Barton Dubois, MD   1 g at 09/08/16 2135  . ondansetron (ZOFRAN) injection 4 mg  4 mg Intravenous Q6H PRN Barton Dubois, MD      . polyethylene glycol (MIRALAX / GLYCOLAX) packet 17 g  17 g Oral Daily Maryann Mikhail, DO   17 g at 09/08/16 0929  . sodium chloride flush (NS) 0.9 % injection 3 mL  3 mL Intravenous Q12H Barton Dubois, MD   3 mL at 09/08/16 2134  . sodium chloride flush (NS) 0.9 % injection 3 mL  3 mL Intravenous PRN Barton Dubois, MD      . tamsulosin [facility]) capsule 0.4 mg  0.4 mg Oral QPC supper Barton Dubois, MD   0.4 mg at 09/08/16 1812     Discharge Medications: Please see discharge summary for a list of discharge medications.  Relevant Imaging Results:  Relevant Lab Results:   Additional Information SSN:  [id number]  Standley Brooking, LCSW

## 2016-09-09 NOTE — Progress Notes (Signed)
Patient c/o mid sternal chest pain. Skin warm and dry. VS [id number] O2 sat 97% on Room air. O2 started at 2l/min. EKG obtained. MD notified with new orders obtained. Eulas Post, RN

## 2016-09-09 NOTE — Progress Notes (Signed)
Patient again c/o mid sternal chest pain. MD notified and orders obtained. GI cocktail given. Eulas Post, RN

## 2016-09-10 ENCOUNTER — Inpatient Hospital Stay (HOSPITAL_COMMUNITY): Payer: Medicare HMO

## 2016-09-10 LAB — URINALYSIS, ROUTINE W REFLEX MICROSCOPIC
Bilirubin Urine: NEGATIVE
GLUCOSE, UA: NEGATIVE mg/dL
Ketones, ur: NEGATIVE mg/dL
NITRITE: NEGATIVE
PH: 5 (ref 5.0–8.0)
PROTEIN: 100 mg/dL — AB
SPECIFIC GRAVITY, URINE: 1.016 (ref 1.005–1.030)

## 2016-09-10 LAB — GLUCOSE, CAPILLARY
GLUCOSE-CAPILLARY: 170 mg/dL — AB (ref 65–99)
Glucose-Capillary: 152 mg/dL — ABNORMAL HIGH (ref 65–99)
Glucose-Capillary: 407 mg/dL — ABNORMAL HIGH (ref 65–99)
Glucose-Capillary: 160 mg/dL — ABNORMAL HIGH (ref 65–99)
Glucose-Capillary: 129 mg/dL — ABNORMAL HIGH (ref 65–99)

## 2016-09-10 LAB — TROPONIN I

## 2016-09-10 LAB — BASIC METABOLIC PANEL
ANION GAP: 10 (ref 5–15)
BUN: 58 mg/dL — ABNORMAL HIGH (ref 6–20)
CALCIUM: 8.2 mg/dL — AB (ref 8.9–10.3)
CO2: 22 mmol/L (ref 22–32)
CREATININE: 3.66 mg/dL — AB (ref 0.61–1.24)
Chloride: 105 mmol/L (ref 101–111)
GFR calc Af Amer: 17 mL/min — ABNORMAL LOW (ref >60)
GFR calc non Af Amer: 14 mL/min — ABNORMAL LOW (ref >60)
Glucose, Bld: 171 mg/dL — ABNORMAL HIGH (ref 65–99)
Potassium: 5.3 mmol/L — ABNORMAL HIGH (ref 3.5–5.1)
Sodium: 137 mmol/L (ref 135–145)

## 2016-09-10 LAB — CBC
HCT: 24.8 % — ABNORMAL LOW (ref 39.0–52.0)
HEMATOCRIT: 25.1 % — AB (ref 39.0–52.0)
HEMOGLOBIN: 7.7 g/dL — AB (ref 13.0–17.0)
Hemoglobin: 7.7 g/dL — ABNORMAL LOW (ref 13.0–17.0)
MCH: 24.2 pg — AB (ref 26.0–34.0)
MCH: 24.1 pg — ABNORMAL LOW (ref 26.0–34.0)
MCHC: 31 g/dL (ref 30.0–36.0)
MCHC: 30.7 g/dL (ref 30.0–36.0)
MCV: 78 fL (ref 78.0–100.0)
MCV: 78.4 fL (ref 78.0–100.0)
Platelets: 96 10*3/uL — ABNORMAL LOW (ref 150–400)
Platelets: 96 10*3/uL — ABNORMAL LOW (ref 150–400)
RBC: 3.18 MIL/uL — ABNORMAL LOW (ref 4.22–5.81)
RBC: 3.2 MIL/uL — ABNORMAL LOW (ref 4.22–5.81)
RDW: 16.9 % — ABNORMAL HIGH (ref 11.5–15.5)
RDW: 17 % — AB (ref 11.5–15.5)
WBC: 24.4 10*3/uL — ABNORMAL HIGH (ref 4.0–10.5)
WBC: 23.7 10*3/uL — AB (ref 4.0–10.5)

## 2016-09-10 LAB — MRSA PCR SCREENING: MRSA BY PCR: NEGATIVE

## 2016-09-10 LAB — LACTIC ACID, PLASMA: Lactic Acid, Venous: 1.5 mmol/L (ref 0.5–1.9)

## 2016-09-10 MED ORDER — IOPAMIDOL (ISOVUE-300) INJECTION 61%: 30.0000 mL | Freq: Once | INTRAVENOUS | Status: DC

## 2016-09-10 MED ORDER — PIPERACILLIN-TAZOBACTAM IN DEX 2-0.25 GM/50ML IV SOLN
2.2500 g | Freq: Three times a day (TID) | INTRAVENOUS | Status: DC
  Administered 2016-09-10 – 2016-09-12 (×7): 2.25 g via INTRAVENOUS
  Filled 2016-09-10 (×7): qty 50

## 2016-09-10 MED ORDER — FUROSEMIDE 10 MG/ML IJ SOLN: 40.0000 mg | Freq: Two times a day (BID) | INTRAMUSCULAR | Status: DC

## 2016-09-10 MED ORDER — VANCOMYCIN HCL 10 G IV SOLR
1500.0000 mg | INTRAVENOUS | Status: DC
  Administered 2016-09-12: 1500 mg via INTRAVENOUS
  Filled 2016-09-10: qty 1500

## 2016-09-10 MED ORDER — IOPAMIDOL (ISOVUE-300) INJECTION 61%
INTRAVENOUS | Status: AC
  Filled 2016-09-10: qty 30

## 2016-09-10 MED ORDER — ISOSORBIDE MONONITRATE ER 30 MG PO TB24: 30.0000 mg | ORAL_TABLET | Freq: Every day | ORAL | Status: DC

## 2016-09-10 MED ORDER — VANCOMYCIN HCL 10 G IV SOLR
2000.0000 mg | Freq: Once | INTRAVENOUS | Status: AC
  Administered 2016-09-10: 2000 mg via INTRAVENOUS
  Filled 2016-09-10: qty 2000

## 2016-09-10 NOTE — Clinical Social Work Placement (Signed)
CSW provided SNF bed offers to patient who accepted bed at [facility]. CSW confirmed with Ivin Booty at [facility] that they would be able to take patient when ready - submitted clinicals to [facility] for authorization.    Raynaldo Opitz, [facility] Clinical Social Worker cell #: [phone]    CLINICAL SOCIAL WORK PLACEMENT  NOTE  Date:  09/10/2016  Patient Details  Name: [name] MRN: [id number] Date of Birth: [birth year]  Clinical Social Work is seeking post-discharge placement for this patient at the [place] level of care (*CSW will initial, date and re-position this form in  chart as items are completed):  Yes   Patient/family provided with [place] Work Department's list of facilities offering this level of care within the geographic area requested by the patient (or if unable, by the patient's family).  Yes   Patient/family informed of their freedom to choose among providers that offer the needed level of care, that participate in Medicare, Medicaid or managed care program needed by the patient, have an available bed and are willing to accept the patient.  Yes   Patient/family informed of [facility]'s ownership interest in [facility] and [facility], as well as of the fact that they are under no obligation to receive care at these facilities.  PASRR submitted to EDS on 09/10/16     PASRR number received on 09/10/16     Existing PASRR number confirmed on       FL2 transmitted to all facilities in geographic area requested by pt/family on 09/10/16     FL2 transmitted to all facilities within larger geographic area on       Patient informed that his/her managed care company has contracts with or will negotiate with certain facilities, including the following:        Yes   Patient/family informed of bed offers received.  Patient chooses bed at [facility]     Physician recommends and patient  chooses bed at      Patient to be transferred to [facility] on  .  Patient to be transferred to facility by       Patient family notified on   of transfer.  Name of family member notified:        PHYSICIAN       Additional Comment:    _______________________________________________ Standley Brooking, LCSW 09/10/2016, 3:05 PM

## 2016-09-10 NOTE — Progress Notes (Signed)
Physical Therapy Treatment Patient Details Name: Deontre Degiorgio MRN: [id number] DOB: 03-Nov-1933 Today's Date: 09/10/2016    History of Present Illness 83 y.o. male with PMH significant for chronic diastolic HF, CKD stage, 3, obesity, LE lymphedema, CAD, HTN, atrial fibrillation (on eliquis), HLD,  and diabetes and admitted for acute on chronic diastolic heart failure    PT Comments    Pt slow to respond and vague about his history. He did say he lives in a "really bad apartment" and walks with a [id number].  When I asked "Who helps you with groceries and getting your medicine" he replied his Rushie Nyhan buddies son.   Assisted OOB to attempt amb required + 2 assist.  Pt was only able to take few steps.   Pt will need ST Rehab at SNF  Follow Up Recommendations  SNF     Equipment Recommendations  None recommended by PT    Recommendations for Other Services       Precautions / Restrictions Precautions Precautions: Fall Precaution Comments: CHF, edema throughout, B LE Unna Boot wrap Restrictions Weight Bearing Restrictions: No Other Position/Activity Restrictions: WBAT    Mobility  Bed Mobility Overal bed mobility: Needs Assistance Bed Mobility: Supine to Sit     Supine to sit: Max assist;Total assist;+2 for physical assistance;+2 for safety/equipment     General bed mobility comments: assist for bil LEs and upper body.  pt 15%   Used bed pad to complete scooting and required rail to prevent posterior LOB  Transfers Overall transfer level: Needs assistance Equipment used: Rolling walker (2 wheeled) Transfers: Sit to/from Stand Sit to Stand: Mod assist;+2 physical assistance;+2 safety/equipment;From elevated surface         General transfer comment: + 2 and off elevated bed with 50% VC's for dierction  Ambulation/Gait Ambulation/Gait assistance: Max assist;+2 physical assistance;+2 safety/equipment Ambulation Distance (Feet): 2 Feet Assistive device: Rolling walker (2  wheeled) Gait Pattern/deviations: Step-to pattern;Shuffle;Decreased step length - right;Decreased step length - left Gait velocity: decreased   General Gait Details: very limited amb distance due to B LE pain (pins and needles) and swelling.  NT assisted by following with recliner.     Stairs            Wheelchair Mobility    Modified Rankin (Stroke Patients Only)       Balance                                    Cognition Arousal/Alertness:  (slow, delayed) Behavior During Therapy:  (odd with mild current situation confusion) Overall Cognitive Status: No family/caregiver present to determine baseline cognitive functioning                 General Comments: following repeat one step commands inconsistantly     Exercises      General Comments        Pertinent Vitals/Pain Pain Assessment: Faces Faces Pain Scale: Hurts even more Pain Location: B LE "pins and needles" Pain Descriptors / Indicators: Pins and needles Pain Intervention(s): Monitored during session;Repositioned    Home Living                      Prior Function            PT Goals (current goals can now be found in the care plan section) Progress towards PT goals: Progressing toward goals    Frequency  Min 3X/week      PT Plan Current plan remains appropriate    Co-evaluation             End of Session Equipment Utilized During Treatment: Gait belt Activity Tolerance: Patient limited by fatigue Patient left: in chair;with call bell/phone within reach;with chair alarm set     Time: 1006-1030 PT Time Calculation (min) (ACUTE ONLY): 24 min  Charges:  $Gait Training: 8-22 mins $Therapeutic Activity: 8-22 mins                    G Codes:      Rica Koyanagi  PTA [facility]  Acute  Rehab Pager      [phone]

## 2016-09-10 NOTE — Progress Notes (Addendum)
PROGRESS NOTE    [name]  [id number] DOB: 02-18-1934 DOA: 09/05/2016 PCP: [name], PA   Chief Complaint  Patient presents with  . Urinary Retention     Brief Narrative:  On 09/05/2016 by Dr. Barton Dubois [name] is a 83 y.o. male with PMH significant for chronic diastolic HF, CKD stage, 3, obesity, LE lymphedema, CAD, HTN, atrial fibrillation (on eliquis), HLD and diabetes with nephropathy; who presented to ED from his SNF due to SOB, worsening LE edema and inability to urinate. Patient endorses been unable to pee despite lasix for the last 14 hours or so. Also has experienced SOB and orthopnea for the last 2-3 days, along with worsening on his bilateral LE edema (now unable to pick his left up on his own or to walk properly due to swelling). He denies CP, Fever, HA's, blurred vision, abd pain, dysuria, hematuria, melena, hematochezia or any other complaints.  Interim history Being treated for heart failure with IV Lasix. Kidney function not improvingm, nephrology consulted. Patient did develop leukocytosis, currently looking for possible source of infection. Assessment & Plan   Possible Acute on chronic diastolic heart failure -previous echocardiogram noted grade 2 diastolic dysfunction (Q000111Q) and systolic dysfunction (0000000) -Echocardiogram showed EF 55-60%, left ventricular diastolic function parameters are normal, left atrium was moderately to severely dilated -Not sure if patient's SOB and LE edema is due to CHF exacerbation vs undiagnosed sleep apnea? -BNP on admission 557 -Chest x-ray showed small left pleural effusion with primary congestion -Continue IV Lasix, decreased dose to 40mg  IV BID -Monitor intake and output, daily weights -Urine output over past 24hrs: 2075cc -Weight in Nov 2017 245lbs, currently 264lbs. Not sure that weights are accurate.  Have spoken to RN regarding this. -Currently no ACEi or ARB due to renal function  Acute renal failure  superimposed onCKD, stage IV -due to poor perfusion from CHF exacerbation, plus component of urinary retention most likely from BPH -Creatinine upon admission 3.67, baseline 2.6-2.9 -Creatinine today 3.6 -Renal US showed no hydronephrosis -follow intake/output -Spoke with Dr. Jonnie Finner, nephrology, suggested increasing lasix to 80mg  IV TID and watching. -Continue to monitor BMP closely as patient is diuresing  -Nephrology consulted and appreciated.  Dr. Justin Mend to see today. Recommended to decrease lasix dose back to 40mg  IV BID.  Leukocytosis -UA and CXR were unremarkable for infection, however given recent jump in WBC (24) will obtain repeat UA, CXR, Abdominal Xray.  -Obtain Blood cultures  -Currently afebrile -Will not start antibiotics at this time, will continue to monitor  Addendum: Abd xray showed mild prominent air-filled loops of small and large bowel, mild adynamic ileus cannot be excluded. Spoke with PCCM, given the patient is hypotensive and mildly bradycardic which are all new findings today, will transfer to stepdown. Will order CT abdomen and chest. Lasix, Coreg, hydralazine discontinued. Chest x-ray still shows CHF. Hopefully the CT chest will shed some light whether this is continued CHF versus pneumonia  Anemia of chronic renal failure, stage 3  -Hgb 7.7 -no signs of acute bleeding -will monitor trend and if Hgb less than 7 will need transfusion  -Continue to monitor CBC (may improve with diuresis)  Diabetes mellitus, type II with nephropathy  -Continue insulin size, CBG monitoring -Lantus and glipizide held -Hemoglobin A1c 6.4  Atrial fibrillation  -Continue Coreg for rate control -continue eliquis -CHADSVASC at least 4 (given age, h/o dCHF, DM, HTN)  Essential Hypertension -BP stable -Continue hydralazine, Lasix, Coreg, Imdur  CAD/Elevated troponin -no CP; but  SOB could be equivalent to angina -troponin peaked at 0.04, 0.03. Currently flat, unlikely ACS.    -Possibly secondary to demand. -Echocardiogram as listed above -Continue coreg, lipitor and Imdur, apixaban  Hyperlipidemia -Continue statin  Acute urinary retention -as mentioned above; foley catheter placed while in ED -Continue flomax  Severe obesity  -Body mass index is 38.67 kg/m. -Will need to follow up with PCP upon discharge to discuss lifestyle modifications -Nutrition consulted -patient should have a sleep study as an outpateint  Deconditioning -Not sure where patient is from.  H&P noted SNF, social work/case management noted [facility].  -PT and OT consulted, recommended SNF  Lower extremity edema -Unna boots in place, wound care consulted  Constipation -Continue bowel regimen  DVT Prophylaxis  Eliquis  Code Status: Full  Family Communication: None at bedside  Disposition Plan: Admitted. Continue diuresing, monitor BMP. Nephrology consulted and appreciated.  Continue workup for leukocytosis. Will need SNF once CHF and AKI improve.  Consultants Nephrology   Procedures  Echocardiogram Renal US  Antibiotics   Anti-infectives    None      Subjective:   [name] seen and examined today. Patient complained of chest pain yesterday, however denies any today. Patient feels breathing is improved. Denies cough.  Denies dizziness, headache, abdominal pain, nausea or vomiting.  Objective:   Vitals:   09/09/16 2133 09/10/16 0439 09/10/16 0915 09/10/16 0956  BP: (!) 127/52 (!) 125/54  (!) 106/39  Pulse: 60 64  (!) 57  Resp: 16 14    Temp: 99.3 F (37.4 C) 98.7 F (37.1 C)  98.1 F (36.7 C)  TempSrc: Oral Oral  Oral  SpO2: 100% 100% 98% 95%  Weight:  120.1 kg (264 lb 12.4 oz)    Height:        Intake/Output Summary (Last 24 hours) at 09/10/16 1219 Last data filed at 09/10/16 1000  Gross per 24 hour  Intake              570 ml  Output             2075 ml  Net            -1505 ml   Filed Weights   09/08/16 1201 09/09/16 0540 09/10/16  0439  Weight: 120.5 kg (265 lb 10.5 oz) 122.3 kg (269 lb 10 oz) 120.1 kg (264 lb 12.4 oz)    Exam  General: Well developed, well nourished, NAD, appears stated age  HEENT: NCAT,  mucous membranes moist.   Cardiovascular: S1 S2 auscultated, no rubs, murmurs or gallops. irregular  Respiratory: Diminished but clear, no wheezing noted  Abdomen: Soft, nontender, nondistended, + bowel sounds  Extremities: warm dry without cyanosis clubbing. LE in unna boots, +edema, improving.  Skin: chronic venous stasis skin changes on lower extremities   Neuro: AAOx3, nonfocal  Psych: Flat   Data Reviewed: I have personally reviewed following labs and imaging studies  CBC:  Recent Labs Lab 09/05/16 0857 09/07/16 0601 09/08/16 0532 09/09/16 0546 09/10/16 0820 09/10/16 1129  WBC 13.1* 10.0 10.1 10.4 24.4* 23.7*  NEUTROABS 6.1  --   --   --   --   --   HGB 7.6* 7.0* 7.3* 7.4* 7.7* 7.7*  HCT 24.4* 22.3* 23.2* 24.1* 24.8* 25.1*  MCV 79.2 77.4* 78.1 78.0 78.0 78.4  PLT 131* 95* 106* 95* 96* 96*   Basic Metabolic Panel:  Recent Labs Lab 09/06/16 0402 09/07/16 0601 09/08/16 0532 09/09/16 0546 09/10/16 0820  NA 142 138  138 140 137  K 4.6 4.7 4.9 4.8 5.3*  CL 113* 109 110 108 105  CO2 21* 22 21* 22 22  GLUCOSE 112* 113* 116* 128* 171*  BUN 60* 58* 56* 56* 58*  CREATININE 3.65* 3.51* 3.40* 3.20* 3.66*  CALCIUM 8.2* 8.0* 8.1* 8.1* 8.2*   GFR: Estimated Creatinine Clearance: 21.1 mL/min (by C-G formula based on SCr of 3.66 mg/dL (H)). Liver Function Tests: No results for input(s): AST, ALT, ALKPHOS, BILITOT, PROT, ALBUMIN in the last 168 hours. No results for input(s): LIPASE, AMYLASE in the last 168 hours. No results for input(s): AMMONIA in the last 168 hours. Coagulation Profile: No results for input(s): INR, PROTIME in the last 168 hours. Cardiac Enzymes:  Recent Labs Lab 09/05/16 2211 09/06/16 0402 09/06/16 1052 09/09/16 1728 09/10/16 0820  TROPONINI 0.03* 0.04*  0.03* <0.03 <0.03   BNP (last 3 results) No results for input(s): PROBNP in the last 8760 hours. HbA1C: No results for input(s): HGBA1C in the last 72 hours. CBG:  Recent Labs Lab 09/09/16 1702 09/09/16 2130 09/10/16 0740 09/10/16 0804 09/10/16 1211  GLUCAP 165* 144* 407* 152* 160*   Lipid Profile: No results for input(s): CHOL, HDL, LDLCALC, TRIG, CHOLHDL, LDLDIRECT in the last 72 hours. Thyroid Function Tests: No results for input(s): TSH, T4TOTAL, FREET4, T3FREE, THYROIDAB in the last 72 hours. Anemia Panel: No results for input(s): VITAMINB12, FOLATE, FERRITIN, TIBC, IRON, RETICCTPCT in the last 72 hours. Urine analysis:    Component Value Date/Time   COLORURINE YELLOW 09/05/2016 1301   APPEARANCEUR CLOUDY (A) 09/05/2016 1301   LABSPEC 1.014 09/05/2016 1301   PHURINE 5.0 09/05/2016 1301   GLUCOSEU NEGATIVE 09/05/2016 1301   HGBUR LARGE (A) 09/05/2016 1301   BILIRUBINUR NEGATIVE 09/05/2016 1301   KETONESUR NEGATIVE 09/05/2016 1301   PROTEINUR 100 (A) 09/05/2016 1301   UROBILINOGEN 1.0 05/18/2015 1407   NITRITE NEGATIVE 09/05/2016 1301   LEUKOCYTESUR NEGATIVE 09/05/2016 1301   Sepsis Labs: @LABRCNTIP (procalcitonin:4,lacticidven:4)  ) Recent Results (from the past 240 hour(s))  MRSA PCR Screening     Status: None   Collection Time: 09/05/16 10:29 PM  Result Value Ref Range Status   MRSA by PCR NEGATIVE NEGATIVE Final    Comment:        The GeneXpert MRSA Assay (FDA approved for NASAL specimens only), is one component of a comprehensive MRSA colonization surveillance program. It is not intended to diagnose MRSA infection nor to guide or monitor treatment for MRSA infections.       Radiology Studies: Dg Chest 2 View  Result Date: 09/09/2016 CLINICAL DATA:  Shortness of breath. EXAM: CHEST  2 VIEW COMPARISON:  09/05/2016. FINDINGS: Cardiomegaly with pulmonary vascular prominence and bilateral interstitial prominence with bilateral pleural effusions.  Findings consistent with congestive heart failure. No pneumothorax. IMPRESSION: Congestive heart failure with bilateral pulmonary interstitial edema and bilateral pleural effusions. Similar findings noted on prior exam. Electronically Signed   By: [place]   On: 09/09/2016 09:27     Scheduled Meds: . apixaban  2.5 mg Oral BID  . atorvastatin  20 mg Oral q1800  . carvedilol  12.5 mg Oral BID WC  . furosemide  40 mg Intravenous BID  . hydrocerin  1 application Topical Once per day on Tue Fri  . insulin aspart  0-5 Units Subcutaneous QHS  . insulin aspart  0-9 Units Subcutaneous TID WC  . ipratropium-albuterol  3 mL Nebulization TID  . [START ON 09/11/2016] isosorbide mononitrate  30 mg Oral Daily  .  nystatin  1 g Topical BID  . polyethylene glycol  17 g Oral Daily  . sodium chloride flush  3 mL Intravenous Q12H  . tamsulosin  0.4 mg Oral QPC supper   Continuous Infusions:   LOS: 5 days   Time Spent in minutes   30 minutes  Deaisa Merida D.O. on 09/10/2016 at 12:19 PM  Between 7am to 7pm - Pager - [phone]  After 7pm go to [email or web address] - password [email or web address]  And look for the night coverage person covering for me after hours  [facility] Office  [phone]

## 2016-09-10 NOTE — Progress Notes (Signed)
Pharmacy Antibiotic Note  [name] is a 83 y.o. male admitted on 09/05/2016 with PMH significant for chronic diastolic HF, CKD stage, 3, obesity, LE lymphedema, CAD, HTN, atrial fibrillation (on eliquis), HLD and diabetes with nephropathy; who presented to ED from his SNF due to SOB, worsening LE edema and inability to urinate.  Pharmacy has been consulted for vancomycin and zosyn dosing for sepsis.  Scr 3.66, CrCl ~ 16.42mls/min (N), baseline 2.6-2.9  Plan: Vancomycin 2gm IV x 1 then 1500mg  q48h Zosyn 2.25mg  IV q8h Follow renal function, cultures and clinical course  Height: 5' 10.5" (179.1 cm) Weight: 264 lb 12.4 oz (120.1 kg) IBW/kg (Calculated) : 74.15  Temp (24hrs), Avg:98.7 F (37.1 C), Min:98.1 F (36.7 C), Max:99.3 F (37.4 C)   Recent Labs Lab 09/06/16 0402 09/07/16 0601 09/08/16 0532 09/09/16 0546 09/10/16 0820 09/10/16 1129  WBC  --  10.0 10.1 10.4 24.4* 23.7*  CREATININE 3.65* 3.51* 3.40* 3.20* 3.66*  --     Estimated Creatinine Clearance: 21.1 mL/min (by C-G formula based on SCr of 3.66 mg/dL (H)).    Allergies  Allergen Reactions  . Metformin And Related     Reaction: pt states he thinks he threw up    Antimicrobials this admission: Vancomycin >> 1/16 Zosyn >> 1/16   Microbiology results: 1/16 BCx: sent 1/11 MRSA PCR: negative  Thank you for allowing pharmacy to be a part of this patient's care.  Dolly Rias RPh 09/10/2016, 3:52 PM Pager [phone]

## 2016-09-10 NOTE — H&P (Signed)
Referring Provider: No ref. provider found Primary Care Physician:  [name], PA Primary Nephrologist:    Reason for Consultation:   Acute on Chronic Renal disease  Anemia and some hemodynamic instability. Congestive Heart Failure   HPI: [name] is a 83 y.o. male with PMH significant for chronic diastolic HF, CKD stage, 3, obesity, LE lymphedema, CAD, HTN, atrial fibrillation (on eliquis), HLD and diabetes with nephropathy; who presented to ED from his SNF due to SOB, worsening LE edema and inability to urinate. Patient endorses been unable to pee despite lasix for the last 14 hours or so. Also has experienced SOB and orthopnea for the last 2-3 days, along with worsening on his bilateral LE edema (now unable to pick his left up on his own or to walk properly due to swelling). He denies CP, Fever, HA's, blurred vision, abd pain, dysuria, hematuria, melena, hematochezia or any other complaints.  Baseline creatinine about 2.5   Underwent diuresis with a good response to lasix and negative fluid balance  Seems to been worse over the last 24 hours with declining renal function and a blood pressure that has been slightly lower. Echo consistent with diastolic dysfunction. Continued on lasix 40mg  IV  Q 12 hours. No NSAIDS or contrast or nephrotoxins identified. Bowel shows ileus and wbc increased to 24000. Patient continues on isosorbide and carvedilol He is anticoagulated with Eliquis       Past Medical History:  Diagnosis Date  . Atrial fibrillation ([place])   . CHF (congestive heart failure) ([place])   . Chronic kidney disease   . Coronary artery disease   . Diabetes mellitus without complication ([place])   . Hypertension     Past Surgical History:  Procedure Laterality Date  . COLONOSCOPY  06/21/2012   Procedure: COLONOSCOPY;  Surgeon: Juanita Craver, MD;  Location: [facility] ENDOSCOPY;  Service: Endoscopy;  Laterality: N/A;  . COLONOSCOPY Left 02/13/2014   Procedure: COLONOSCOPY;  Surgeon:  Juanita Craver, MD;  Location: [facility] ENDOSCOPY;  Service: Endoscopy;  Laterality: Left;  . ESOPHAGOGASTRODUODENOSCOPY  06/19/2012   Procedure: ESOPHAGOGASTRODUODENOSCOPY (EGD);  Surgeon: Beryle Beams, MD;  Location: [facility] ENDOSCOPY;  Service: Endoscopy;  Laterality: N/A;  . LAPAROSCOPIC RIGHT COLECTOMY Right 02/15/2014   Procedure: LAPAROSCOPIC ASISSTED RIGHT  COLECTOMY, POSSIBLE OPEN;  Surgeon: Rolm Bookbinder, MD;  Location: [facility];  Service: General;  Laterality: Right;  . LEG SURGERY     Left leg surgery. broken leg    Prior to Admission medications   Medication Sig Start Date End Date Taking? Authorizing Provider  amLODipine (NORVASC) 10 MG tablet Take 1 tablet (10 mg total) by mouth daily. 12/12/15  Yes Ripudeep Krystal Eaton, MD  amoxicillin-clavulanate (AUGMENTIN) 875-125 MG tablet Take 1 tablet by mouth 2 (two) times daily. One po bid x 7 days 07/18/16  Yes Daleen Bo, MD  apixaban (ELIQUIS) 2.5 MG TABS tablet Take 1 tablet (2.5 mg total) by mouth 2 (two) times daily. 12/12/15  Yes Ripudeep Krystal Eaton, MD  atorvastatin (LIPITOR) 20 MG tablet Take 1 tablet (20 mg total) by mouth daily at 6 PM. 12/12/15  Yes Ripudeep K Rai, MD  carvedilol (COREG) 12.5 MG tablet Take 12.5 mg by mouth 2 (two) times daily with a meal.   Yes Historical Provider, MD  furosemide (LASIX) 40 MG tablet Take 1 tablet (40 mg total) by mouth daily. 09/04/15  Yes Ripudeep Krystal Eaton, MD  glimepiride (AMARYL) 2 MG tablet Take 2 mg by mouth daily with breakfast.   Yes Historical  Provider, MD  hydrALAZINE (APRESOLINE) 50 MG tablet Take 1 tablet (50 mg total) by mouth every 8 (eight) hours. 12/12/15  Yes Ripudeep Krystal Eaton, MD  isosorbide mononitrate (IMDUR) 60 MG 24 hr tablet Take 1 tablet (60 mg total) by mouth daily. 12/12/15  Yes Ripudeep Krystal Eaton, MD  nystatin (MYCOSTATIN/NYSTOP) powder Apply 1 g topically 2 (two) times daily. For 14 days   Yes Historical Provider, MD    Current Facility-Administered Medications  Medication Dose Route Frequency  Provider Last Rate Last Dose  . 0.9 %  sodium chloride infusion  250 mL Intravenous PRN Barton Dubois, MD      . acetaminophen (TYLENOL) tablet 650 mg  650 mg Oral Q4H PRN Barton Dubois, MD   650 mg at 09/06/16 0030  . apixaban (ELIQUIS) tablet 2.5 mg  2.5 mg Oral BID Barton Dubois, MD   2.5 mg at 09/10/16 0942  . atorvastatin (LIPITOR) tablet 20 mg  20 mg Oral q1800 Barton Dubois, MD   20 mg at 09/09/16 1835  . bisacodyl (DULCOLAX) EC tablet 5 mg  5 mg Oral Daily PRN Maryann Mikhail, DO   5 mg at 09/10/16 1006  . carvedilol (COREG) tablet 12.5 mg  12.5 mg Oral BID WC Maryann Mikhail, DO   12.5 mg at 09/10/16 0900  . furosemide (LASIX) injection 40 mg  40 mg Intravenous BID Maryann Mikhail, DO      . gi cocktail (Maalox,Lidocaine,Donnatal)  30 mL Oral TID PRN Maryann Mikhail, DO   30 mL at 09/09/16 1854  . hydrocerin (EUCERIN) cream 1 application  1 application Topical Once per day on Tue Fri [facility], DO      . HYDROcodone-acetaminophen (NORCO/VICODIN) 5-325 MG per tablet 1-2 tablet  1-2 tablet Oral Q4H PRN Jeryl Columbia, NP   2 tablet at 09/09/16 2339  . insulin aspart (novoLOG) injection 0-5 Units  0-5 Units Subcutaneous QHS Barton Dubois, MD      . insulin aspart (novoLOG) injection 0-9 Units  0-9 Units Subcutaneous TID WC Barton Dubois, MD   2 Units at 09/10/16 1239  . ipratropium-albuterol (DUONEB) 0.5-2.5 (3) MG/3ML nebulizer solution 3 mL  3 mL Nebulization TID Maryann Mikhail, DO   3 mL at 09/10/16 0915  . [START ON 09/11/2016] isosorbide mononitrate (IMDUR) 24 hr tablet 30 mg  30 mg Oral Daily Maryann Mikhail, DO      . nystatin (MYCOSTATIN/NYSTOP) topical powder 1 g  1 g Topical BID Barton Dubois, MD   1 g at 09/09/16 2201  . ondansetron (ZOFRAN) injection 4 mg  4 mg Intravenous Q6H PRN Barton Dubois, MD   4 mg at 09/09/16 2024  . polyethylene glycol (MIRALAX / GLYCOLAX) packet 17 g  17 g Oral Daily Maryann Mikhail, DO   17 g at 09/10/16 0942  . sodium chloride flush (NS) 0.9  % injection 3 mL  3 mL Intravenous Q12H Barton Dubois, MD   3 mL at 09/09/16 2220  . sodium chloride flush (NS) 0.9 % injection 3 mL  3 mL Intravenous PRN Barton Dubois, MD      . tamsulosin [facility]) capsule 0.4 mg  0.4 mg Oral QPC supper Barton Dubois, MD   0.4 mg at 09/09/16 1835    Allergies as of 09/05/2016 - Review Complete 09/05/2016  Allergen Reaction Noted  . Metformin and related  07/22/2016    Family History  Problem Relation Age of Onset  . Brain cancer Mother   . Diabetes type II Sister   .  Narcolepsy Paternal Uncle     Social History   Social History  . Marital status: Widowed    Spouse name: N/A  . Number of children: N/A  . Years of education: N/A   Occupational History  . Retired    Social History Main Topics  . Smoking status: Former Smoker    Quit date: 08/26/1998  . Smokeless tobacco: Never Used  . Alcohol use No     Comment: in the past  . Drug use: No  . Sexual activity: No   Other Topics Concern  . Not on file   Social History Narrative   Widower-   Has #2 children and #2 grandchildren and a great grandchild   Retired from [employer] (15 years ago)   Currently resides at [employer] and [place] with walker-    Review of Systems: Gen: Denies any fever, chills, sweats, anorexia, fatigue, weakness, malaise, weight loss, and sleep disorder HEENT: No visual complaints, No history of Retinopathy. Normal external appearance No Epistaxis or Sore throat. No sinusitis.   CV: Denies chest pain, angina, palpitations, syncope, orthopnea, PND, peripheral edema, and claudication. Resp: Denies dyspnea at rest, dyspnea with exercise, cough, sputum, wheezing, coughing up blood, and pleurisy. GI: Denies vomiting blood, jaundice, and fecal incontinence.   Denies dysphagia or odynophagia. GU : Denies urinary burning, blood in urine, urinary frequency, urinary hesitancy, nocturnal urination, and urinary incontinence.  No renal calculi. MS: Denies  joint pain, limitation of movement, and swelling, stiffness, low back pain, extremity pain. Denies muscle weakness, cramps, atrophy.  No use of non steroidal antiinflammatory drugs. Derm: Denies rash, itching, dry skin, hives, moles, warts, or unhealing ulcers.  Psych: Denies depression, anxiety, memory loss, suicidal ideation, hallucinations, paranoia, and confusion. Heme: Denies bruising, bleeding, and enlarged lymph nodes. Neuro: No headache.  No diplopia. No dysarthria.  No dysphasia.  No history of CVA.  No Seizures. No paresthesias.  No weakness. Endocrine  DM.  No Thyroid disease.  No Adrenal disease.  Physical Exam: Vital signs in last 24 hours: Temp:  [98 F (36.7 C)-99.3 F (37.4 C)] 98.1 F (36.7 C) (01/16 0956) Pulse Rate:  [57-84] 57 (01/16 0956) Resp:  [14-20] 14 (01/16 0439) BP: (106-141)/(39-61) 106/39 (01/16 0956) SpO2:  [93 %-100 %] 95 % (01/16 0956) Weight:  [120.1 kg (264 lb 12.4 oz)] 120.1 kg (264 lb 12.4 oz) (01/16 0439) Last BM Date: 09/03/16 (MD aware, PRN med ordered) General:   Ill appearing man  Head:  Normocephalic and atraumatic. Eyes:  Sclera clear, no icterus.   Conjunctiva pink. Ears:  Normal auditory acuity. Nose:  No deformity, discharge,  or lesions. Mouth:  No deformity or lesions, dentition normal. Neck:  Supple; no masses or thyromegaly. JVP not elevated Lungs:  Clear throughout to auscultation.   No wheezes, crackles, or rhonchi. No acute distress. Heart:  Regular rate and rhythm; no murmurs, clicks, rubs,  or gallops. Abdomen:  Soft, nontender and nondistended. No masses, hepatosplenomegaly or hernias noted. Normal bowel sounds, without guarding, and without rebound.   Msk:  Symmetrical without gross deformities. Normal posture. Pulses:  No carotid, renal, femoral bruits. DP and PT symmetrical and equal Extremities:  Without clubbing or edema. Skin:  Intact without significant lesions or rashes.   Intake/Output from previous day: 01/15 0701  - 01/16 0700 In: 240 [P.O.:240] Out: 2075 [Urine:2075] Intake/Output this shift: Total I/O In: 690 [P.O.:690] Out: 100 [Urine:100]  Lab Results:  Recent Labs  09/09/16 0546 09/10/16 0820  09/10/16 1129  WBC 10.4 24.4* 23.7*  HGB 7.4* 7.7* 7.7*  HCT 24.1* 24.8* 25.1*  PLT 95* 96* 96*   BMET  Recent Labs  09/08/16 0532 09/09/16 0546 09/10/16 0820  NA 138 140 137  K 4.9 4.8 5.3*  CL 110 108 105  CO2 21* 22 22  GLUCOSE 116* 128* 171*  BUN 56* 56* 58*  CREATININE 3.40* 3.20* 3.66*  CALCIUM 8.1* 8.1* 8.2*   LFT No results for input(s): PROT, ALBUMIN, AST, ALT, ALKPHOS, BILITOT, BILIDIR, IBILI in the last 72 hours. PT/INR No results for input(s): LABPROT, INR in the last 72 hours. Hepatitis Panel No results for input(s): HEPBSAG, HCVAB, HEPAIGM, HEPBIGM in the last 72 hours.  Studies/Results: Dg Chest 2 View  Result Date: 09/10/2016 CLINICAL DATA:  Leukocytosis. EXAM: CHEST  2 VIEW COMPARISON:  09/09/2016. FINDINGS: Cardiomegaly with pulmonary vascular prominence and bilateral pulmonary infiltrates. Bilateral pleural effusions. Findings consistent congestive heart failure. Bilateral pneumonia cannot be excluded. Findings progressed from 09/09/2016. IMPRESSION: Congestive heart failure with bilateral pulmonary edema and bilateral pleural effusions, progressed from prior exam. Bilateral pneumonia cannot be excluded. Electronically Signed   By: Marcello Moores  Register   On: 09/10/2016 14:48   Dg Chest 2 View  Result Date: 09/09/2016 CLINICAL DATA:  Shortness of breath. EXAM: CHEST  2 VIEW COMPARISON:  09/05/2016. FINDINGS: Cardiomegaly with pulmonary vascular prominence and bilateral interstitial prominence with bilateral pleural effusions. Findings consistent with congestive heart failure. No pneumothorax. IMPRESSION: Congestive heart failure with bilateral pulmonary interstitial edema and bilateral pleural effusions. Similar findings noted on prior exam. Electronically Signed   By:  [place]   On: 09/09/2016 09:27   Dg Abd 1 View  Result Date: 09/10/2016 CLINICAL DATA:  Leukocytosis. EXAM: ABDOMEN - 1 VIEW COMPARISON:  Ultrasound 09/06/2016.  Ultrasound 11/24/2014. FINDINGS: Soft tissue structures are unremarkable. Surgical sutures noted over the right abdomen. Rounded calcification right upper quadrant consistent with cholelithiasis. Mildly prominent air -filled loops of small and large bowel noted. Adynamic ileus cannot be excluded. No free air. No acute bony or joint abnormality identified. Vascular calcification. IMPRESSION: 1. Mildly prominent air-filled loops of small and large bowel noted. Mild adynamic ileus cannot be excluded. Follow-up abdominal series to exclude bowel obstruction. 2. Prominent calcific density right upper quadrant most likely large gallstone. Electronically Signed   By: Marcello Moores  Register   On: 09/10/2016 14:52    Assessment/Plan:  Acute on Chronic Renal Failure  The differential would be ischemic ATN, AIN or Acute GN, there is nothing to suggest obstruction. Urine has to numerous to count rbc and the wbc is 6-30 with some mild proteinuria. It appears that the worsening renal pathology is at least related to some decrease in BP and a clinically declining picture despite diuresis. A renal biopsy may be helpful although not practical at this point. My overall opinion would be that the decline is due to a worsening septic picture and believe that a CT abdomen may be very useful to evaluate ischemic bowel. I would hold antihypertensives at this point and would not try to diurese the patient further.  Discussed case with Dr Ree Kida   LOS: 5 Shar Paez W @TODAY @3 :19 PM

## 2016-09-10 NOTE — Progress Notes (Signed)
Una boots removed and legs washed per order. Paged ortho tech to replace boots.

## 2016-09-11 ENCOUNTER — Inpatient Hospital Stay (HOSPITAL_COMMUNITY): Payer: Medicare HMO

## 2016-09-11 DIAGNOSIS — I313 Pericardial effusion (noninflammatory): Secondary | ICD-10-CM

## 2016-09-11 DIAGNOSIS — I3139 Other pericardial effusion (noninflammatory): Secondary | ICD-10-CM | POA: Diagnosis present

## 2016-09-11 DIAGNOSIS — I5033 Acute on chronic diastolic (congestive) heart failure: Secondary | ICD-10-CM

## 2016-09-11 DIAGNOSIS — I441 Atrioventricular block, second degree: Secondary | ICD-10-CM | POA: Diagnosis present

## 2016-09-11 DIAGNOSIS — I10 Essential (primary) hypertension: Secondary | ICD-10-CM

## 2016-09-11 DIAGNOSIS — I319 Disease of pericardium, unspecified: Secondary | ICD-10-CM

## 2016-09-11 LAB — BASIC METABOLIC PANEL
ANION GAP: 8 (ref 5–15)
BUN: 63 mg/dL — ABNORMAL HIGH (ref 6–20)
CALCIUM: 8 mg/dL — AB (ref 8.9–10.3)
CO2: 23 mmol/L (ref 22–32)
Chloride: 106 mmol/L (ref 101–111)
Creatinine, Ser: 3.69 mg/dL — ABNORMAL HIGH (ref 0.61–1.24)
GFR, EST AFRICAN AMERICAN: 16 mL/min — AB (ref >60)
GFR, EST NON AFRICAN AMERICAN: 14 mL/min — AB (ref >60)
Glucose, Bld: 123 mg/dL — ABNORMAL HIGH (ref 65–99)
POTASSIUM: 5.5 mmol/L — AB (ref 3.5–5.1)
Sodium: 137 mmol/L (ref 135–145)

## 2016-09-11 LAB — CBC
HEMATOCRIT: 22.8 % — AB (ref 39.0–52.0)
Hemoglobin: 7.1 g/dL — ABNORMAL LOW (ref 13.0–17.0)
MCH: 24.1 pg — ABNORMAL LOW (ref 26.0–34.0)
MCHC: 31.1 g/dL (ref 30.0–36.0)
MCV: 77.6 fL — AB (ref 78.0–100.0)
PLATELETS: 95 10*3/uL — AB (ref 150–400)
RBC: 2.94 MIL/uL — ABNORMAL LOW (ref 4.22–5.81)
RDW: 17.1 % — AB (ref 11.5–15.5)
WBC: 15.3 10*3/uL — AB (ref 4.0–10.5)

## 2016-09-11 LAB — ECHOCARDIOGRAM LIMITED
Height: 70.5 in
Weight: 4331.6 oz

## 2016-09-11 LAB — GLUCOSE, CAPILLARY
Glucose-Capillary: 158 mg/dL — ABNORMAL HIGH (ref 65–99)
Glucose-Capillary: 141 mg/dL — ABNORMAL HIGH (ref 65–99)
Glucose-Capillary: 178 mg/dL — ABNORMAL HIGH (ref 65–99)
Glucose-Capillary: 182 mg/dL — ABNORMAL HIGH (ref 65–99)

## 2016-09-11 LAB — OCCULT BLOOD X 1 CARD TO LAB, STOOL: Fecal Occult Bld: NEGATIVE

## 2016-09-11 MED ORDER — FUROSEMIDE 10 MG/ML IJ SOLN
120.0000 mg | Freq: Two times a day (BID) | INTRAVENOUS | Status: DC
  Administered 2016-09-11 – 2016-09-12 (×2): 120 mg via INTRAVENOUS
  Filled 2016-09-11: qty 12
  Filled 2016-09-11: qty 10
  Filled 2016-09-11: qty 4

## 2016-09-11 MED ORDER — SODIUM POLYSTYRENE SULFONATE 15 GM/60ML PO SUSP
30.0000 g | Freq: Once | ORAL | Status: AC
  Administered 2016-09-11: 30 g via ORAL
  Filled 2016-09-11: qty 120

## 2016-09-11 NOTE — Consult Note (Signed)
Reason for Consult:   Arrhythmia  Requesting Physician: [facility] Primary Cardiologist Dr Julianne Handler (2013)  HPI:   83 y.o. year old [place] male with a history of HTN, HLD, DM, CKD III, diastolic CHF, CVA in April 2017, chronic lymphedema, and PAF with slow VR. The pt is a SNF resident. He was seen in 2013 by cards for preop eval and troponin up to 1.20 in the setting of a fall w/ anemia, colon mass and injuries. EF 40-45%. Med rx recommended.   We next saw him in April 2017 when he presented with a CVA and fall (he had declined Eliquis for PAF a few months earlier). He was bradycardic then - in and out of AF-but felt to be a poor candidate for a pacemaker secondary to co morbidities.   He is admitted now from SNF with urinary retention, increased dyspnea, and worsening LE edema. His SCr was 3.67 on adm, baseline 2.5. Echo shows preserved LVF- EF 55-60% with moderate LVH. CT scan 09/10/16 showed a moderate pericardial effusion "? pericarditis". On telemetry he has had periods of AF with slow VR, and variable AVB and Wenckebach. Overall rates in the 45-70 range.    PMHx:  Past Medical History:  Diagnosis Date  . Atrial fibrillation ([place])   . CHF (congestive heart failure) ([place])   . Chronic kidney disease   . Coronary artery disease   . Diabetes mellitus without complication ([place])   . Hypertension     Past Surgical History:  Procedure Laterality Date  . COLONOSCOPY  06/21/2012   Procedure: COLONOSCOPY;  Surgeon: Juanita Craver, MD;  Location: [facility] ENDOSCOPY;  Service: Endoscopy;  Laterality: N/A;  . COLONOSCOPY Left 02/13/2014   Procedure: COLONOSCOPY;  Surgeon: Juanita Craver, MD;  Location: [facility] ENDOSCOPY;  Service: Endoscopy;  Laterality: Left;  . ESOPHAGOGASTRODUODENOSCOPY  06/19/2012   Procedure: ESOPHAGOGASTRODUODENOSCOPY (EGD);  Surgeon: Beryle Beams, MD;  Location: [facility] ENDOSCOPY;  Service: Endoscopy;  Laterality: N/A;  . LAPAROSCOPIC RIGHT COLECTOMY Right 02/15/2014   Procedure: LAPAROSCOPIC ASISSTED RIGHT  COLECTOMY, POSSIBLE OPEN;  Surgeon: Rolm Bookbinder, MD;  Location: [place];  Service: General;  Laterality: Right;  . LEG SURGERY     Left leg surgery. broken leg    SOCHx:  reports that he quit smoking about 18 years ago. He has never used smokeless tobacco. He reports that he does not drink alcohol or use drugs.SNF pat, retired from [employer]  FAMHx: Family History  Problem Relation Age of Onset  . Brain cancer Mother   . Diabetes type II Sister   . Narcolepsy Paternal Uncle     ALLERGIES: Allergies  Allergen Reactions  . Metformin And Related     Reaction: pt states he thinks he threw up    ROS: Review of Systems: General: negative for chills, fever, night sweats or weight changes.  Cardiovascular: negative for chest pain, orthopnea, palpitations, paroxysmal nocturnal dyspnea  HEENT: negative for any visual disturbances, blindness, glaucoma Dermatological: negative for rash Respiratory: negative for cough, hemoptysis, or wheezing Urologic: negative for hematuria or dysuria Abdominal: negative for nausea, vomiting, diarrhea, bright red blood per rectum, melena, or hematemesis Neurologic: negative for visual changes, syncope, or dizziness Musculoskeletal: negative for back pain, joint pain, or swelling Psych: cooperative and appropriate All other systems reviewed and are otherwise negative except as noted above.   HOME MEDICATIONS: Prior to Admission medications   Medication Sig Start Date End Date Taking? Authorizing Provider  amLODipine (NORVASC) 10  MG tablet Take 1 tablet (10 mg total) by mouth daily. 12/12/15  Yes Ripudeep Krystal Eaton, MD  amoxicillin-clavulanate (AUGMENTIN) 875-125 MG tablet Take 1 tablet by mouth 2 (two) times daily. One po bid x 7 days 07/18/16  Yes Daleen Bo, MD  apixaban (ELIQUIS) 2.5 MG TABS tablet Take 1 tablet (2.5 mg total) by mouth 2 (two) times daily. 12/12/15  Yes Ripudeep Krystal Eaton, MD  atorvastatin  (LIPITOR) 20 MG tablet Take 1 tablet (20 mg total) by mouth daily at 6 PM. 12/12/15  Yes Ripudeep K Rai, MD  carvedilol (COREG) 12.5 MG tablet Take 12.5 mg by mouth 2 (two) times daily with a meal.   Yes Historical Provider, MD  furosemide (LASIX) 40 MG tablet Take 1 tablet (40 mg total) by mouth daily. 09/04/15  Yes Ripudeep Krystal Eaton, MD  glimepiride (AMARYL) 2 MG tablet Take 2 mg by mouth daily with breakfast.   Yes Historical Provider, MD  hydrALAZINE (APRESOLINE) 50 MG tablet Take 1 tablet (50 mg total) by mouth every 8 (eight) hours. 12/12/15  Yes Ripudeep Krystal Eaton, MD  isosorbide mononitrate (IMDUR) 60 MG 24 hr tablet Take 1 tablet (60 mg total) by mouth daily. 12/12/15  Yes Ripudeep Krystal Eaton, MD  nystatin (MYCOSTATIN/NYSTOP) powder Apply 1 g topically 2 (two) times daily. For 14 days   Yes Historical Provider, MD    HOSPITAL MEDICATIONS: I have reviewed the patient's current medications.  VITALS: Blood pressure (!) 129/33, pulse 84, temperature 98.8 F (37.1 C), temperature source Oral, resp. rate 15, height 5' 10.5" (1.791 m), weight 270 lb 11.6 oz (122.8 kg), SpO2 96 %.  PHYSICAL EXAM: General appearance: alert, mild distress and morbidly obese Lungs: decreased breath sounds Heart: regular rate and rhythm and no rub Abdomen: obese Extremities: bilateral Una boots in place Pulses: diminnished Skin: warm dry Neurologic: pt moaning, incontinent of stool (recieved Kayexalate this am), c/o SOB, he told me he did not know where he was.   LABS: Results for orders placed or performed during the hospital encounter of 09/05/16 (from the past 24 hour(s))  CBC     Status: Abnormal   Collection Time: 09/10/16 11:29 AM  Result Value Ref Range   WBC 23.7 (H) 4.0 - 10.5 K/uL   RBC 3.20 (L) 4.22 - 5.81 MIL/uL   Hemoglobin 7.7 (L) 13.0 - 17.0 g/dL   HCT 25.1 (L) 39.0 - 52.0 %   MCV 78.4 78.0 - 100.0 fL   MCH 24.1 (L) 26.0 - 34.0 pg   MCHC 30.7 30.0 - 36.0 g/dL   RDW 17.0 (H) 11.5 - 15.5 %   Platelets  96 (L) 150 - 400 K/uL  Urinalysis, Routine w reflex microscopic     Status: Abnormal   Collection Time: 09/10/16 11:42 AM  Result Value Ref Range   Color, Urine AMBER (A) YELLOW   APPearance CLOUDY (A) CLEAR   Specific Gravity, Urine 1.016 1.005 - 1.030   pH 5.0 5.0 - 8.0   Glucose, UA NEGATIVE NEGATIVE mg/dL   Hgb urine dipstick LARGE (A) NEGATIVE   Bilirubin Urine NEGATIVE NEGATIVE   Ketones, ur NEGATIVE NEGATIVE mg/dL   Protein, ur 100 (A) NEGATIVE mg/dL   Nitrite NEGATIVE NEGATIVE   Leukocytes, UA TRACE (A) NEGATIVE   RBC / HPF TOO NUMEROUS TO COUNT 0 - 5 RBC/hpf   WBC, UA 6-30 0 - 5 WBC/hpf   Bacteria, UA RARE (A) NONE SEEN   Squamous Epithelial / LPF 0-5 (A) NONE SEEN  Mucous PRESENT    Hyaline Casts, UA PRESENT   Glucose, capillary     Status: Abnormal   Collection Time: 09/10/16 12:11 PM  Result Value Ref Range   Glucose-Capillary 160 (H) 65 - 99 mg/dL  Lactic acid, plasma     Status: None   Collection Time: 09/10/16  4:08 PM  Result Value Ref Range   Lactic Acid, Venous 1.5 0.5 - 1.9 mmol/L  Glucose, capillary     Status: Abnormal   Collection Time: 09/10/16  4:52 PM  Result Value Ref Range   Glucose-Capillary 170 (H) 65 - 99 mg/dL  MRSA PCR Screening     Status: None   Collection Time: 09/10/16  6:00 PM  Result Value Ref Range   MRSA by PCR NEGATIVE NEGATIVE  Glucose, capillary     Status: Abnormal   Collection Time: 09/10/16  9:59 PM  Result Value Ref Range   Glucose-Capillary 129 (H) 65 - 99 mg/dL   Comment 1 Notify RN    Comment 2 Document in Chart   CBC     Status: Abnormal   Collection Time: 09/11/16  3:29 AM  Result Value Ref Range   WBC 15.3 (H) 4.0 - 10.5 K/uL   RBC 2.94 (L) 4.22 - 5.81 MIL/uL   Hemoglobin 7.1 (L) 13.0 - 17.0 g/dL   HCT 22.8 (L) 39.0 - 52.0 %   MCV 77.6 (L) 78.0 - 100.0 fL   MCH 24.1 (L) 26.0 - 34.0 pg   MCHC 31.1 30.0 - 36.0 g/dL   RDW 17.1 (H) 11.5 - 15.5 %   Platelets 95 (L) 150 - 400 K/uL  Basic metabolic panel     Status:  Abnormal   Collection Time: 09/11/16  3:29 AM  Result Value Ref Range   Sodium 137 135 - 145 mmol/L   Potassium 5.5 (H) 3.5 - 5.1 mmol/L   Chloride 106 101 - 111 mmol/L   CO2 23 22 - 32 mmol/L   Glucose, Bld 123 (H) 65 - 99 mg/dL   BUN 63 (H) 6 - 20 mg/dL   Creatinine, Ser 3.69 (H) 0.61 - 1.24 mg/dL   Calcium 8.0 (L) 8.9 - 10.3 mg/dL   GFR calc non Af Amer 14 (L) >60 mL/min   GFR calc Af Amer 16 (L) >60 mL/min   Anion gap 8 5 - 15  Glucose, capillary     Status: Abnormal   Collection Time: 09/11/16  7:48 AM  Result Value Ref Range   Glucose-Capillary 158 (H) 65 - 99 mg/dL    EKG: 09/10/16- Wenckebach  IMAGING: Ct Abdomen Pelvis Wo Contrast  Result Date: 09/10/2016 CLINICAL DATA:  CHF.  Leukocytosis. EXAM: CT CHEST, ABDOMEN AND PELVIS WITHOUT CONTRAST TECHNIQUE: Multidetector CT imaging of the chest, abdomen and pelvis was performed following the standard protocol without IV contrast. COMPARISON:  Chest CT 08/29/2015 FINDINGS: CT CHEST FINDINGS Cardiovascular: Chronic cardiomegaly. There is a moderate pericardial effusion that is new from echocardiogram report 09/06/2016. Some of the fat around the effusion is infiltrated and pericarditis is considered. No convincing ventricular wall or septal distortion from mass-effect. Mediastinum/Nodes: Diffuse atherosclerotic calcification. No noncontrast evidence of acute vascular disease. Lungs/Pleura: Moderate layering pleural effusions, larger on the left. Multi segment atelectasis. Musculoskeletal: No acute or aggressive finding. Exaggerated thoracic kyphosis with spondylosis with multi-level ankylosis. CT ABDOMEN PELVIS FINDINGS Hepatobiliary: No focal liver abnormality.Extensive calcification in the gallbladder fossa. This is likely luminal and from large gallstone rather than mural calcification. No common bile duct  dilatation. Pancreas: Unremarkable. Spleen: Unremarkable. Adrenals/Urinary Tract: Negative adrenals. Smooth bilateral renal atrophy.  No hydronephrosis. Cortical and hilar cyst at the left inferior pole. The bladder is decompressed around a Foley catheter. Stomach/Bowel:  No obstruction. Status post right hemicolectomy Vascular/Lymphatic: Diffuse atherosclerotic calcification. No mass or adenopathy. Reproductive:Symmetric prostate enlargement. Other: Small non loculated ascites and Extensive body wall edema. Small inguinal hernias, fatty on the left and ascitic on the right. Musculoskeletal: No acute abnormalities. Diffuse degenerative change. IMPRESSION: 1. Moderate pericardial effusion not noted on recent echocardiography. Neighboring fat is reticulated and pericarditis should be considered. 2. Volume overload with marked anasarca, moderate bilateral pleural effusion, and small ascites. 3. Multi segment atelectasis. Electronically Signed   By: Monte Fantasia M.D.   On: 09/10/2016 16:53   Dg Chest 2 View  Result Date: 09/10/2016 CLINICAL DATA:  Leukocytosis. EXAM: CHEST  2 VIEW COMPARISON:  09/09/2016. FINDINGS: Cardiomegaly with pulmonary vascular prominence and bilateral pulmonary infiltrates. Bilateral pleural effusions. Findings consistent congestive heart failure. Bilateral pneumonia cannot be excluded. Findings progressed from 09/09/2016. IMPRESSION: Congestive heart failure with bilateral pulmonary edema and bilateral pleural effusions, progressed from prior exam. Bilateral pneumonia cannot be excluded. Electronically Signed   By: Marcello Moores  Register   On: 09/10/2016 14:48   Dg Chest 2 View  Result Date: 09/09/2016 CLINICAL DATA:  Shortness of breath. EXAM: CHEST  2 VIEW COMPARISON:  09/05/2016. FINDINGS: Cardiomegaly with pulmonary vascular prominence and bilateral interstitial prominence with bilateral pleural effusions. Findings consistent with congestive heart failure. No pneumothorax. IMPRESSION: Congestive heart failure with bilateral pulmonary interstitial edema and bilateral pleural effusions. Similar findings noted on prior  exam. Electronically Signed   By: [place]   On: 09/09/2016 09:27   Dg Abd 1 View  Result Date: 09/10/2016 CLINICAL DATA:  Leukocytosis. EXAM: ABDOMEN - 1 VIEW COMPARISON:  Ultrasound 09/06/2016.  Ultrasound 11/24/2014. FINDINGS: Soft tissue structures are unremarkable. Surgical sutures noted over the right abdomen. Rounded calcification right upper quadrant consistent with cholelithiasis. Mildly prominent air -filled loops of small and large bowel noted. Adynamic ileus cannot be excluded. No free air. No acute bony or joint abnormality identified. Vascular calcification. IMPRESSION: 1. Mildly prominent air-filled loops of small and large bowel noted. Mild adynamic ileus cannot be excluded. Follow-up abdominal series to exclude bowel obstruction. 2. Prominent calcific density right upper quadrant most likely large gallstone. Electronically Signed   By: Marcello Moores  Register   On: 09/10/2016 14:52   Ct Chest Wo Contrast  Result Date: 09/10/2016 CLINICAL DATA:  CHF.  Leukocytosis. EXAM: CT CHEST, ABDOMEN AND PELVIS WITHOUT CONTRAST TECHNIQUE: Multidetector CT imaging of the chest, abdomen and pelvis was performed following the standard protocol without IV contrast. COMPARISON:  Chest CT 08/29/2015 FINDINGS: CT CHEST FINDINGS Cardiovascular: Chronic cardiomegaly. There is a moderate pericardial effusion that is new from echocardiogram report 09/06/2016. Some of the fat around the effusion is infiltrated and pericarditis is considered. No convincing ventricular wall or septal distortion from mass-effect. Mediastinum/Nodes: Diffuse atherosclerotic calcification. No noncontrast evidence of acute vascular disease. Lungs/Pleura: Moderate layering pleural effusions, larger on the left. Multi segment atelectasis. Musculoskeletal: No acute or aggressive finding. Exaggerated thoracic kyphosis with spondylosis with multi-level ankylosis. CT ABDOMEN PELVIS FINDINGS Hepatobiliary: No focal liver abnormality.Extensive  calcification in the gallbladder fossa. This is likely luminal and from large gallstone rather than mural calcification. No common bile duct dilatation. Pancreas: Unremarkable. Spleen: Unremarkable. Adrenals/Urinary Tract: Negative adrenals. Smooth bilateral renal atrophy. No hydronephrosis. Cortical and hilar cyst at the left inferior pole. The  bladder is decompressed around a Foley catheter. Stomach/Bowel:  No obstruction. Status post right hemicolectomy Vascular/Lymphatic: Diffuse atherosclerotic calcification. No mass or adenopathy. Reproductive:Symmetric prostate enlargement. Other: Small non loculated ascites and Extensive body wall edema. Small inguinal hernias, fatty on the left and ascitic on the right. Musculoskeletal: No acute abnormalities. Diffuse degenerative change. IMPRESSION: 1. Moderate pericardial effusion not noted on recent echocardiography. Neighboring fat is reticulated and pericarditis should be considered. 2. Volume overload with marked anasarca, moderate bilateral pleural effusion, and small ascites. 3. Multi segment atelectasis. Electronically Signed   By: Monte Fantasia M.D.   On: 09/10/2016 16:53    IMPRESSION: Principal Problem:   Acute on chronic diastolic congestive heart failure (HCC) Active Problems:   Lymphedema   Acute renal failure superimposed on stage 3 chronic kidney disease (HCC)   Chronic diastolic CHF (congestive heart failure) (HCC)   Anemia of chronic renal failure, stage 3 (moderate)   Uncontrolled diabetes mellitus with diabetic nephropathy, with long-term current use of insulin (HCC)   PAF (paroxysmal atrial fibrillation) (HCC)   Atrial flutter (HCC)   Bradycardia   AKI (acute kidney injury) ([place])   HTN (hypertension)   Severe obesity (BMI >= 40) (HCC)   Mobitz type 1 second degree AV block   Pericardial effusion- moderate on CT scan   RECOMMENDATION: No pericardial effusion mentioned on echo 09/06/16- will repeat echo. MD to review. He did have  a spike in his WBC and hypotension yesterday- blood cultures are pending. Check stool guiac  Time Spent Directly with Patient: 70 minutes  Kerin Ransom, [place] beeper 09/11/2016, 8:36 AM  As above, patient seen and examined. Briefly he is an 83 year old male with past medical history of hypertension, hyperlipidemia, diabetes mellitus, chronic stage IV kidney disease, diastolic congestive heart failure, paroxysmal atrial fibrillation with prior CVA, prior Mobitz 1 for evaluation of acute on chronic diastolic congestive heart failure. Patient describes increasing dyspnea and lower extremity edema. He was admitted and treated with diuretics with some improvement but his renal function has deteriorated. A moderate pericardial effusion was noted on CT scan and he has also been having Mobitz 1 and cardiology asked to evaluate. Patient denies chest pain or syncope. Initial echocardiogram on January 12 showed normal LV systolic function with trivial pericardial effusion. Preliminary images today show small pericardial effusion.   1 acute on chronic diastolic congestive heart failure-patient remains volume overloaded on examination. This is likely a combination of both diastolic congestive heart failure with component of renal insufficiency as well. Dr. Justin Mend has held his diuretic. However he will likely need further diuresis.  2 pericardial effusion-moderate on CT scan by interpretation. Limited images today show small pericardial effusion and I do not believe this is contributing to his present symptoms. Possibly secondary to renal insufficiency.  3 Mobitz 1 second-degree AV block-noted on telemetry. Would discontinue beta blocker. He is not having symptoms. No indication for pacemaker. Follow on telemetry.  4 paroxysmal atrial fibrillation-will hold Coreg as noted above. Continue apixaban (CHADSvasc 8).  5 acute on chronic stage IV kidney disease-nephrology managing.  6 hypertension-blood  pressure medications on hold per nephrology as they feel hypoperfusion may be contributing to worsening renal function. He will likely need a lower dose initiated.  7 elevated white blood cell count/question infection-per primary care.  Kirk Ruths, MD

## 2016-09-11 NOTE — Progress Notes (Signed)
  Echocardiogram 2D Echocardiogram has been performed.  Darlina Sicilian M 09/11/2016, 11:05 AM

## 2016-09-11 NOTE — Progress Notes (Signed)
Occupational Therapy Treatment Patient Details Name: [name] MRN: [id number] DOB: 12/02/1933 Today's Date: 09/11/2016    History of present illness 83 y.o. male with PMH significant for chronic diastolic HF, CKD stage, 3, obesity, LE lymphedema, CAD, HTN, atrial fibrillation (on eliquis), HLD,  and diabetes and admitted for acute on chronic diastolic heart failure   OT comments  Pt continues to make slow progress in OT; very motivated to getting up to 3:1 this afternoon.  NT and RN each assisted either on or off  Follow Up Recommendations  SNF    Equipment Recommendations  3 in 1 bedside commode (wide)    Recommendations for Other Services      Precautions / Restrictions Precautions Precautions: Fall Precaution Comments: CHF, bil Unna boots, edema LUE Restrictions Other Position/Activity Restrictions: WBAT       Mobility Bed Mobility         Supine to sit: Max assist;HOB elevated Sit to supine: Max assist   General bed mobility comments: assist for bil UEs and trunk  Transfers   Equipment used: Rolling walker (2 wheeled)   Sit to Stand: Mod assist;+2 physical assistance;+2 safety/equipment;From elevated surface Stand pivot transfers: Min assist +2 safety       General transfer comment: 2 to raise up; allowed pt to pull up on walker with stabilizing    Balance                                   ADL       Grooming: Wash/dry hands;Set up;Sitting;Wash/dry face;Minimal assistance (min A for thoroughness for face)                   Toilet Transfer: Moderate assistance;+2 for physical assistance;RW;BSC   Toileting- Clothing Manipulation and Hygiene: Total assistance;+2 for physical assistance         General ADL Comments: pt agreeable to sitting EOB this afternoon.  did not want to sit up in chair but agreeable to [facility]      Vision                     Perception     Praxis      Cognition     Overall Cognitive  Status: No family/caregiver present to determine baseline cognitive functioning                  General Comments: following repeat one step commands inconsistantly     Extremity/Trunk Assessment               Exercises     Shoulder Instructions       General Comments      Pertinent Vitals/ Pain       Pain Assessment: Faces Pain Location: bil LES, especially heels Pain Descriptors / Indicators: Pins and needles Pain Intervention(s): Limited activity within patient's tolerance;Monitored during session;Repositioned  Home Living                                          Prior Functioning/Environment              Frequency           Progress Toward Goals  OT Goals(current goals can now be found in the care plan section)  Progress towards OT goals: Progressing toward  goals  Acute Rehab OT Goals Patient Stated Goal: back to bed Time For Goal Achievement: 09/20/16  Plan      Co-evaluation                 End of Session     Activity Tolerance Patient tolerated treatment well   Patient Left in bed;with call bell/phone within reach;with bed alarm set   Nurse Communication          Time: [id number] OT Time Calculation (min): 47 min  Charges: OT General Charges $OT Visit: 1 Procedure OT Treatments $Self Care/Home Management : 38-52 mins  Macari Zalesky 09/11/2016, 2:49 PM  Lesle Chris, OTR/L [phone] 09/11/2016

## 2016-09-11 NOTE — Progress Notes (Signed)
PROGRESS NOTE    [name]  [id number] DOB: 1934/06/16 DOA: 09/05/2016 PCP: [name], PA   Brief Narrative:  [name] Jeffersonis a 83 y.o.malewith PMH significant for chronic diastolic HF, CKD stage, 3, obesity, LE lymphedema, CAD, HTN, atrial fibrillation (on eliquis), HLD and diabetes with nephropathy; who presented to ED from his SNF due to SOB, worsening LE edema and inability to urinate. Patient endorses been unable to pee despite lasix for the last 14 hours or so. Also has experienced SOB and orthopnea for the last 2-3 days, along with worsening on his bilateral LE edema (now unable to pick his left up on his own or to walk properly due to swelling). He denies CP, Fever, HA's, blurred vision, abd pain, dysuria, hematuria, melena, hematochezia or any other complaints. Currently being treated for Heart Failure Exacerbation with Anasarca. Cardiology and Nephrology are consulted and following.   Assessment & Plan:   Principal Problem:   Acute on chronic diastolic congestive heart failure (HCC) Active Problems:   Lymphedema   Acute renal failure superimposed on stage 3 chronic kidney disease (HCC)   Chronic diastolic CHF (congestive heart failure) (HCC)   Anemia of chronic renal failure, stage 3 (moderate)   Uncontrolled diabetes mellitus with diabetic nephropathy, with long-term current use of insulin (HCC)   PAF (paroxysmal atrial fibrillation) (HCC)   Atrial flutter (HCC)   Bradycardia   AKI (acute kidney injury) ([place])   HTN (hypertension)   Severe obesity (BMI >= 40) (HCC)   Mobitz type 1 second degree AV block   Pericardial effusion- moderate on CT scan  Acute on Chronic diastolic heart failure -previous echocardiogram noted grade 2 diastolic dysfunction (Q000111Q) and systolic dysfunction (0000000) -Echocardiogram showed EF 55-60%, left ventricular diastolic function parameters are normal, left atrium was moderately to severely dilated -Cardiology Repeated ECHO; Showed  IVC is Dilated possibly to Volume Overload -Not sure if patient's SOB and LE edema is due to CHF exacerbation vs undiagnosed sleep apnea? -BNP on admission 557 -Chest x-ray showed small left pleural effusion with primary congestion -Continue IV Lasix, decreased dose to 120 mg IV BID -Monitor intake and output, daily weights -Weight in Nov 2017 245lbs, currently 264lbs. Not sure that weights are accurate.  Have spoken to RN regarding this. -Currently no ACEi or ARB due to renal function -Appreciate Cardiology Following  Acute renal failure superimposed onCKD, stage IV -due to poor perfusion from CHF exacerbation, plus component of urinary retention most likely from BPH -Creatinine upon admission 3.67, baseline 2.6-2.9 -Creatinine today 3.69 -Renal US showed no hydronephrosis -follow intake/output -Nephrology Following and patient being Diuresed 120 mg IV BID  Pericardial Effusion -Cardiology Following -Repeat ECHO showed it was small and no RV Diastolic Collapse -Continue to Monitor  Leukocytosis -Improving; WBC down to 15.3 -UA and CXR were unremarkable for infection, however given recent jump in WBC (24) -repeat UA Negative, CXR showed CHF, Abdominal Xray.  -Obtain Blood cultures  -Currently afebrile -Will not start antibiotics at this time, will continue to monitor -Abd xray showed mild prominent air-filled loops of small and large bowel, mild adynamic ileus cannot be excluded. *had Bowel Movement this Am. -CT of Chest showed Moderate Pericardial Effusion with ?pericarditis; Volume Overload with Marked Anasarca, Moderate B/L Pleural Effusions and Small Ascites Noted. Multiple Segment Atelectacasis noded  Hyperkalemia -Mild; Given Kayexelate  -Should Corrrect with Diuresis and Kayexelate -Repeat BMP in AM  Anemia of chronic renal failure, stage 3  -Hgb 7.1 down from 7.7 -no signs of acute  bleeding -will monitor trend and if Hgb less than 7 will need transfusion    -Continue to monitor CBC (may improve with diuresis)  Diabetes mellitus, type II with nephropathy  -Continue insulin size, CBG monitoring; Last CBG's have ranged from 141-178 -Lantus and glipizide held -Hemoglobin A1c 6.4  Atrial fibrillation  -Continue Coreg for rate control -continue eliquis -CHADSVASC at least 4 (given age, h/o dCHF, DM, HTN) -Cardiology Following  Essential Hypertension -BP stable -Continue hydralazine, Lasix, Coreg, Imdur  CAD/Elevated troponin -no CP; but SOB could be equivalent to angina -troponin peaked at 0.04, 0.03. Currently flat, unlikely ACS.  -Possibly secondary to demand. -Echocardiogram as listed above -Continue coreg, lipitor and Imdur, apixaban -Cardiology Following  Hyperlipidemia -Continue statin  Acute urinary retention -as mentioned above; foley catheter placed while in ED -Continue flomax  Severe obesity  -Body mass index is 38.67 kg/m. -Will need to follow up with PCP upon discharge to discuss lifestyle modifications -Nutrition consulted -patient should have a sleep study as an outpateint  Deconditioning -Not sure where patient is from.  H&P noted SNF, social work/case management noted Hotel.  -PT and OT consulted, recommended SNF once stable for D/C  Lower extremity edema -Unna boots in place, wound care consulted  Constipation -Improved -Continue bowel regimen  DVT prophylaxis: Anticoagulated with Apixaban Code Status: FULL CODE Family Communication: No Family present at bedside Disposition Plan: Remain in SDU and transfer to [facility] if stable in AM  Consultants:   Nephrology  Cardiology  Procedures: Echocardiogram Renal Ultrasound  Antimicrobials: None  Subjective: Seen and examined and still stated he was SOB. Had a BM this Am. No Cp.    Objective: Vitals:   09/11/16 1400 09/11/16 1500 09/11/16 1600 09/11/16 1700  BP: 137/75 (!) 126/58 (!) 144/59 (!) 146/79  Pulse: 81 76 79 78  Resp:  11 12 16 17   Temp:    98.6 F (37 C)  TempSrc:    Oral  SpO2: 96% 98% 99% 98%  Weight:      Height:        Intake/Output Summary (Last 24 hours) at 09/11/16 1846 Last data filed at 09/11/16 1700  Gross per 24 hour  Intake              872 ml  Output              740 ml  Net              132 ml   Filed Weights   09/09/16 0540 09/10/16 0439 09/11/16 0428  Weight: 122.3 kg (269 lb 10 oz) 120.1 kg (264 lb 12.4 oz) 122.8 kg (270 lb 11.6 oz)    Examination: Physical Exam:  Constitutional: WN/WD, NAD and appears calm and comfortable Eyes: Lids and conjunctivae normal, sclerae anicteric  ENMT: External Ears, Nose appear normal. Grossly normal hearing.  Neck: Appears normal, supple, no cervical masses, normal ROM, no appreciable thyromegaly, JVD noted Respiratory: Diminished to Ausculatation bilaterally, no wheezing, rales, rhonchi or crackles.  Cardiovascular: RRR, no murmurs / rubs / gallops. S1 and S2 auscultated. 2+Abdomen: Soft, non-tender, non-distended. No masses palpated. No appreciable hepatosplenomegaly. Bowel sounds positive x4.  GU: Deferred. Musculoskeletal: No clubbing / cyanosis of digits/nails. No joint deformity upper and lower extremities. Wearing Una Boots Skin: No rashes, lesions, ulcers. No induration on limited skin eval; Warm and dry.  Neurologic: CN 2-12 grossly intact with no focal deficits. Sensation intact in all 4 Extremities. Romberg sign cerebellar reflexes not assessed.  Psychiatric: Normal judgment and insight. Alert and oriented x 3. Normal mood and appropriate affect.   Data Reviewed: I have personally reviewed following labs and imaging studies  CBC:  Recent Labs Lab 09/05/16 0857  09/08/16 0532 09/09/16 0546 09/10/16 0820 09/10/16 1129 09/11/16 0329  WBC 13.1*  < > 10.1 10.4 24.4* 23.7* 15.3*  NEUTROABS 6.1  --   --   --   --   --   --   HGB 7.6*  < > 7.3* 7.4* 7.7* 7.7* 7.1*  HCT 24.4*  < > 23.2* 24.1* 24.8* 25.1* 22.8*  MCV 79.2  < >  78.1 78.0 78.0 78.4 77.6*  PLT 131*  < > 106* 95* 96* 96* 95*  < > = values in this interval not displayed. Basic Metabolic Panel:  Recent Labs Lab 09/07/16 0601 09/08/16 0532 09/09/16 0546 09/10/16 0820 09/11/16 0329  NA 138 138 140 137 137  K 4.7 4.9 4.8 5.3* 5.5*  CL 109 110 108 105 106  CO2 22 21* 22 22 23   GLUCOSE 113* 116* 128* 171* 123*  BUN 58* 56* 56* 58* 63*  CREATININE 3.51* 3.40* 3.20* 3.66* 3.69*  CALCIUM 8.0* 8.1* 8.1* 8.2* 8.0*   GFR: Estimated Creatinine Clearance: 21.1 mL/min (by C-G formula based on SCr of 3.69 mg/dL (H)). Liver Function Tests: No results for input(s): AST, ALT, ALKPHOS, BILITOT, PROT, ALBUMIN in the last 168 hours. No results for input(s): LIPASE, AMYLASE in the last 168 hours. No results for input(s): AMMONIA in the last 168 hours. Coagulation Profile: No results for input(s): INR, PROTIME in the last 168 hours. Cardiac Enzymes:  Recent Labs Lab 09/05/16 2211 09/06/16 0402 09/06/16 1052 09/09/16 1728 09/10/16 0820  TROPONINI 0.03* 0.04* 0.03* <0.03 <0.03   BNP (last 3 results) No results for input(s): PROBNP in the last 8760 hours. HbA1C: No results for input(s): HGBA1C in the last 72 hours. CBG:  Recent Labs Lab 09/10/16 1652 09/10/16 2159 09/11/16 0748 09/11/16 1212 09/11/16 1702  GLUCAP 170* 129* 158* 141* 178*   Lipid Profile: No results for input(s): CHOL, HDL, LDLCALC, TRIG, CHOLHDL, LDLDIRECT in the last 72 hours. Thyroid Function Tests: No results for input(s): TSH, T4TOTAL, FREET4, T3FREE, THYROIDAB in the last 72 hours. Anemia Panel: No results for input(s): VITAMINB12, FOLATE, FERRITIN, TIBC, IRON, RETICCTPCT in the last 72 hours. Sepsis Labs:  Recent Labs Lab 09/10/16 1608  LATICACIDVEN 1.5    Recent Results (from the past 240 hour(s))  MRSA PCR Screening     Status: None   Collection Time: 09/05/16 10:29 PM  Result Value Ref Range Status   MRSA by PCR NEGATIVE NEGATIVE Final    Comment:          The GeneXpert MRSA Assay (FDA approved for NASAL specimens only), is one component of a comprehensive MRSA colonization surveillance program. It is not intended to diagnose MRSA infection nor to guide or monitor treatment for MRSA infections.   Culture, blood (routine x 2)     Status: None (Preliminary result)   Collection Time: 09/10/16  1:30 PM  Result Value Ref Range Status   Specimen Description BLOOD RIGHT ARM  Final   Special Requests BOTTLES DRAWN AEROBIC AND ANAEROBIC 10CC  Final   Culture   Final    NO GROWTH < 24 HOURS Performed at [facility], [place] [address], [place], [place] [address]    Report Status PENDING  Incomplete  Culture, blood (routine x 2)     Status:  None (Preliminary result)   Collection Time: 09/10/16  3:14 PM  Result Value Ref Range Status   Specimen Description RIGHT ANTECUBITAL  Final   Special Requests BOTTLES DRAWN AEROBIC AND ANAEROBIC 10CC  Final   Culture   Final    NO GROWTH < 24 HOURS Performed at [facility], [place] [address] [place], [place] [address]    Report Status PENDING  Incomplete  MRSA PCR Screening     Status: None   Collection Time: 09/10/16  6:00 PM  Result Value Ref Range Status   MRSA by PCR NEGATIVE NEGATIVE Final    Comment:        The GeneXpert MRSA Assay (FDA approved for NASAL specimens only), is one component of a comprehensive MRSA colonization surveillance program. It is not intended to diagnose MRSA infection nor to guide or monitor treatment for MRSA infections.     Radiology Studies: Ct Abdomen Pelvis Wo Contrast  Result Date: 09/10/2016 CLINICAL DATA:  CHF.  Leukocytosis. EXAM: CT CHEST, ABDOMEN AND PELVIS WITHOUT CONTRAST TECHNIQUE: Multidetector CT imaging of the chest, abdomen and pelvis was performed following the standard protocol without IV contrast. COMPARISON:  Chest CT 08/29/2015 FINDINGS: CT CHEST FINDINGS Cardiovascular: Chronic cardiomegaly. There is a moderate pericardial  effusion that is new from echocardiogram report 09/06/2016. Some of the fat around the effusion is infiltrated and pericarditis is considered. No convincing ventricular wall or septal distortion from mass-effect. Mediastinum/Nodes: Diffuse atherosclerotic calcification. No noncontrast evidence of acute vascular disease. Lungs/Pleura: Moderate layering pleural effusions, larger on the left. Multi segment atelectasis. Musculoskeletal: No acute or aggressive finding. Exaggerated thoracic kyphosis with spondylosis with multi-level ankylosis. CT ABDOMEN PELVIS FINDINGS Hepatobiliary: No focal liver abnormality.Extensive calcification in the gallbladder fossa. This is likely luminal and from large gallstone rather than mural calcification. No common bile duct dilatation. Pancreas: Unremarkable. Spleen: Unremarkable. Adrenals/Urinary Tract: Negative adrenals. Smooth bilateral renal atrophy. No hydronephrosis. Cortical and hilar cyst at the left inferior pole. The bladder is decompressed around a Foley catheter. Stomach/Bowel:  No obstruction. Status post right hemicolectomy Vascular/Lymphatic: Diffuse atherosclerotic calcification. No mass or adenopathy. Reproductive:Symmetric prostate enlargement. Other: Small non loculated ascites and Extensive body wall edema. Small inguinal hernias, fatty on the left and ascitic on the right. Musculoskeletal: No acute abnormalities. Diffuse degenerative change. IMPRESSION: 1. Moderate pericardial effusion not noted on recent echocardiography. Neighboring fat is reticulated and pericarditis should be considered. 2. Volume overload with marked anasarca, moderate bilateral pleural effusion, and small ascites. 3. Multi segment atelectasis. Electronically Signed   By: Monte Fantasia M.D.   On: 09/10/2016 16:53   Dg Chest 2 View  Result Date: 09/10/2016 CLINICAL DATA:  Leukocytosis. EXAM: CHEST  2 VIEW COMPARISON:  09/09/2016. FINDINGS: Cardiomegaly with pulmonary vascular prominence  and bilateral pulmonary infiltrates. Bilateral pleural effusions. Findings consistent congestive heart failure. Bilateral pneumonia cannot be excluded. Findings progressed from 09/09/2016. IMPRESSION: Congestive heart failure with bilateral pulmonary edema and bilateral pleural effusions, progressed from prior exam. Bilateral pneumonia cannot be excluded. Electronically Signed   By: Marcello Moores  Register   On: 09/10/2016 14:48   Dg Abd 1 View  Result Date: 09/10/2016 CLINICAL DATA:  Leukocytosis. EXAM: ABDOMEN - 1 VIEW COMPARISON:  Ultrasound 09/06/2016.  Ultrasound 11/24/2014. FINDINGS: Soft tissue structures are unremarkable. Surgical sutures noted over the right abdomen. Rounded calcification right upper quadrant consistent with cholelithiasis. Mildly prominent air -filled loops of small and large bowel noted. Adynamic ileus cannot be excluded. No free air. No acute bony or joint  abnormality identified. Vascular calcification. IMPRESSION: 1. Mildly prominent air-filled loops of small and large bowel noted. Mild adynamic ileus cannot be excluded. Follow-up abdominal series to exclude bowel obstruction. 2. Prominent calcific density right upper quadrant most likely large gallstone. Electronically Signed   By: Marcello Moores  Register   On: 09/10/2016 14:52   Ct Chest Wo Contrast  Result Date: 09/10/2016 CLINICAL DATA:  CHF.  Leukocytosis. EXAM: CT CHEST, ABDOMEN AND PELVIS WITHOUT CONTRAST TECHNIQUE: Multidetector CT imaging of the chest, abdomen and pelvis was performed following the standard protocol without IV contrast. COMPARISON:  Chest CT 08/29/2015 FINDINGS: CT CHEST FINDINGS Cardiovascular: Chronic cardiomegaly. There is a moderate pericardial effusion that is new from echocardiogram report 09/06/2016. Some of the fat around the effusion is infiltrated and pericarditis is considered. No convincing ventricular wall or septal distortion from mass-effect. Mediastinum/Nodes: Diffuse atherosclerotic calcification. No  noncontrast evidence of acute vascular disease. Lungs/Pleura: Moderate layering pleural effusions, larger on the left. Multi segment atelectasis. Musculoskeletal: No acute or aggressive finding. Exaggerated thoracic kyphosis with spondylosis with multi-level ankylosis. CT ABDOMEN PELVIS FINDINGS Hepatobiliary: No focal liver abnormality.Extensive calcification in the gallbladder fossa. This is likely luminal and from large gallstone rather than mural calcification. No common bile duct dilatation. Pancreas: Unremarkable. Spleen: Unremarkable. Adrenals/Urinary Tract: Negative adrenals. Smooth bilateral renal atrophy. No hydronephrosis. Cortical and hilar cyst at the left inferior pole. The bladder is decompressed around a Foley catheter. Stomach/Bowel:  No obstruction. Status post right hemicolectomy Vascular/Lymphatic: Diffuse atherosclerotic calcification. No mass or adenopathy. Reproductive:Symmetric prostate enlargement. Other: Small non loculated ascites and Extensive body wall edema. Small inguinal hernias, fatty on the left and ascitic on the right. Musculoskeletal: No acute abnormalities. Diffuse degenerative change. IMPRESSION: 1. Moderate pericardial effusion not noted on recent echocardiography. Neighboring fat is reticulated and pericarditis should be considered. 2. Volume overload with marked anasarca, moderate bilateral pleural effusion, and small ascites. 3. Multi segment atelectasis. Electronically Signed   By: Monte Fantasia M.D.   On: 09/10/2016 16:53   Scheduled Meds: . apixaban  2.5 mg Oral BID  . atorvastatin  20 mg Oral q1800  . furosemide  120 mg Intravenous Q12H  . hydrocerin  1 application Topical Once per day on Tue Fri  . insulin aspart  0-5 Units Subcutaneous QHS  . insulin aspart  0-9 Units Subcutaneous TID WC  . iopamidol  30 mL Oral Once  . ipratropium-albuterol  3 mL Nebulization TID  . nystatin  1 g Topical BID  . piperacillin-tazobactam (ZOSYN)  IV  2.25 g Intravenous Q8H   . polyethylene glycol  17 g Oral Daily  . sodium chloride flush  3 mL Intravenous Q12H  . tamsulosin  0.4 mg Oral QPC supper  . [START ON 09/12/2016] vancomycin  1,500 mg Intravenous Q48H   Continuous Infusions:   LOS: 6 days   Kerney Elbe, DO [facility] Pager [phone]  If 7PM-7AM, please contact night-coverage [email or web address] Password [facility] 09/11/2016, 6:46 PM

## 2016-09-11 NOTE — Progress Notes (Signed)
[facility] ROUNDING NOTE   Subjective:   Interval History: lying in bed comfortably  In no distress  CT results reviewed and patient does have significant anasarca  He was also started on broad spectrum antibiotics for his leukocytosis -- Zosyn and Vancomycin  Objective:  Vital signs in last 24 hours:  Temp:  [98 F (36.7 C)-98.8 F (37.1 C)] 98.8 F (37.1 C) (01/17 0800) Pulse Rate:  [41-88] 82 (01/17 1100) Resp:  [13-26] 20 (01/17 1100) BP: (98-168)/(28-75) 148/54 (01/17 1100) SpO2:  [89 %-98 %] 96 % (01/17 1100) Weight:  [122.8 kg (270 lb 11.6 oz)] 122.8 kg (270 lb 11.6 oz) (01/17 0428)  Weight change: 2.7 kg (5 lb 15.2 oz) Filed Weights   09/09/16 0540 09/10/16 0439 09/11/16 0428  Weight: 122.3 kg (269 lb 10 oz) 120.1 kg (264 lb 12.4 oz) 122.8 kg (270 lb 11.6 oz)    Intake/Output: I/O last 3 completed shifts: In: 1440 [P.O.:840; IV Piggyback:600] Out: [id number] [Urine:1515]   Intake/Output this shift:  Total I/O In: 460 [P.O.:410; IV Piggyback:50] Out: -   CVS- RRR no rub  RS- CTA decreased airentry  ABD-  Obese hypoactive bowel sounds  EXT-  Bilateral una boots    Basic Metabolic Panel:  Recent Labs Lab 09/07/16 0601 09/08/16 0532 09/09/16 0546 09/10/16 0820 09/11/16 0329  NA 138 138 140 137 137  K 4.7 4.9 4.8 5.3* 5.5*  CL 109 110 108 105 106  CO2 22 21* 22 22 23   GLUCOSE 113* 116* 128* 171* 123*  BUN 58* 56* 56* 58* 63*  CREATININE 3.51* 3.40* 3.20* 3.66* 3.69*  CALCIUM 8.0* 8.1* 8.1* 8.2* 8.0*    Liver Function Tests: No results for input(s): AST, ALT, ALKPHOS, BILITOT, PROT, ALBUMIN in the last 168 hours. No results for input(s): LIPASE, AMYLASE in the last 168 hours. No results for input(s): AMMONIA in the last 168 hours.  CBC:  Recent Labs Lab 09/05/16 0857  09/08/16 0532 09/09/16 0546 09/10/16 0820 09/10/16 1129 09/11/16 0329  WBC 13.1*  < > 10.1 10.4 24.4* 23.7* 15.3*  NEUTROABS 6.1  --   --   --   --   --   --   HGB  7.6*  < > 7.3* 7.4* 7.7* 7.7* 7.1*  HCT 24.4*  < > 23.2* 24.1* 24.8* 25.1* 22.8*  MCV 79.2  < > 78.1 78.0 78.0 78.4 77.6*  PLT 131*  < > 106* 95* 96* 96* 95*  < > = values in this interval not displayed.  Cardiac Enzymes:  Recent Labs Lab 09/05/16 2211 09/06/16 0402 09/06/16 1052 09/09/16 1728 09/10/16 0820  TROPONINI 0.03* 0.04* 0.03* <0.03 <0.03    BNP: Invalid input(s): POCBNP  CBG:  Recent Labs Lab 09/10/16 1211 09/10/16 1652 09/10/16 2159 09/11/16 0748 09/11/16 1212  GLUCAP 160* 170* 129* 158* 141*    Microbiology: Results for orders placed or performed during the hospital encounter of 09/05/16  MRSA PCR Screening     Status: None   Collection Time: 09/05/16 10:29 PM  Result Value Ref Range Status   MRSA by PCR NEGATIVE NEGATIVE Final    Comment:        The GeneXpert MRSA Assay (FDA approved for NASAL specimens only), is one component of a comprehensive MRSA colonization surveillance program. It is not intended to diagnose MRSA infection nor to guide or monitor treatment for MRSA infections.   MRSA PCR Screening     Status: None   Collection Time: 09/10/16  6:00  PM  Result Value Ref Range Status   MRSA by PCR NEGATIVE NEGATIVE Final    Comment:        The GeneXpert MRSA Assay (FDA approved for NASAL specimens only), is one component of a comprehensive MRSA colonization surveillance program. It is not intended to diagnose MRSA infection nor to guide or monitor treatment for MRSA infections.     Coagulation Studies: No results for input(s): LABPROT, INR in the last 72 hours.  Urinalysis:  Recent Labs  09/10/16 1142  COLORURINE AMBER*  LABSPEC 1.016  PHURINE 5.0  GLUCOSEU NEGATIVE  HGBUR LARGE*  BILIRUBINUR NEGATIVE  KETONESUR NEGATIVE  PROTEINUR 100*  NITRITE NEGATIVE  LEUKOCYTESUR TRACE*      Imaging: Ct Abdomen Pelvis Wo Contrast  Result Date: 09/10/2016 CLINICAL DATA:  CHF.  Leukocytosis. EXAM: CT CHEST, ABDOMEN AND  PELVIS WITHOUT CONTRAST TECHNIQUE: Multidetector CT imaging of the chest, abdomen and pelvis was performed following the standard protocol without IV contrast. COMPARISON:  Chest CT 08/29/2015 FINDINGS: CT CHEST FINDINGS Cardiovascular: Chronic cardiomegaly. There is a moderate pericardial effusion that is new from echocardiogram report 09/06/2016. Some of the fat around the effusion is infiltrated and pericarditis is considered. No convincing ventricular wall or septal distortion from mass-effect. Mediastinum/Nodes: Diffuse atherosclerotic calcification. No noncontrast evidence of acute vascular disease. Lungs/Pleura: Moderate layering pleural effusions, larger on the left. Multi segment atelectasis. Musculoskeletal: No acute or aggressive finding. Exaggerated thoracic kyphosis with spondylosis with multi-level ankylosis. CT ABDOMEN PELVIS FINDINGS Hepatobiliary: No focal liver abnormality.Extensive calcification in the gallbladder fossa. This is likely luminal and from large gallstone rather than mural calcification. No common bile duct dilatation. Pancreas: Unremarkable. Spleen: Unremarkable. Adrenals/Urinary Tract: Negative adrenals. Smooth bilateral renal atrophy. No hydronephrosis. Cortical and hilar cyst at the left inferior pole. The bladder is decompressed around a Foley catheter. Stomach/Bowel:  No obstruction. Status post right hemicolectomy Vascular/Lymphatic: Diffuse atherosclerotic calcification. No mass or adenopathy. Reproductive:Symmetric prostate enlargement. Other: Small non loculated ascites and Extensive body wall edema. Small inguinal hernias, fatty on the left and ascitic on the right. Musculoskeletal: No acute abnormalities. Diffuse degenerative change. IMPRESSION: 1. Moderate pericardial effusion not noted on recent echocardiography. Neighboring fat is reticulated and pericarditis should be considered. 2. Volume overload with marked anasarca, moderate bilateral pleural effusion, and small  ascites. 3. Multi segment atelectasis. Electronically Signed   By: Monte Fantasia M.D.   On: 09/10/2016 16:53   Dg Chest 2 View  Result Date: 09/10/2016 CLINICAL DATA:  Leukocytosis. EXAM: CHEST  2 VIEW COMPARISON:  09/09/2016. FINDINGS: Cardiomegaly with pulmonary vascular prominence and bilateral pulmonary infiltrates. Bilateral pleural effusions. Findings consistent congestive heart failure. Bilateral pneumonia cannot be excluded. Findings progressed from 09/09/2016. IMPRESSION: Congestive heart failure with bilateral pulmonary edema and bilateral pleural effusions, progressed from prior exam. Bilateral pneumonia cannot be excluded. Electronically Signed   By: Marcello Moores  Register   On: 09/10/2016 14:48   Dg Abd 1 View  Result Date: 09/10/2016 CLINICAL DATA:  Leukocytosis. EXAM: ABDOMEN - 1 VIEW COMPARISON:  Ultrasound 09/06/2016.  Ultrasound 11/24/2014. FINDINGS: Soft tissue structures are unremarkable. Surgical sutures noted over the right abdomen. Rounded calcification right upper quadrant consistent with cholelithiasis. Mildly prominent air -filled loops of small and large bowel noted. Adynamic ileus cannot be excluded. No free air. No acute bony or joint abnormality identified. Vascular calcification. IMPRESSION: 1. Mildly prominent air-filled loops of small and large bowel noted. Mild adynamic ileus cannot be excluded. Follow-up abdominal series to exclude bowel obstruction. 2. Prominent calcific density right  upper quadrant most likely large gallstone. Electronically Signed   By: Marcello Moores  Register   On: 09/10/2016 14:52   Ct Chest Wo Contrast  Result Date: 09/10/2016 CLINICAL DATA:  CHF.  Leukocytosis. EXAM: CT CHEST, ABDOMEN AND PELVIS WITHOUT CONTRAST TECHNIQUE: Multidetector CT imaging of the chest, abdomen and pelvis was performed following the standard protocol without IV contrast. COMPARISON:  Chest CT 08/29/2015 FINDINGS: CT CHEST FINDINGS Cardiovascular: Chronic cardiomegaly. There is a  moderate pericardial effusion that is new from echocardiogram report 09/06/2016. Some of the fat around the effusion is infiltrated and pericarditis is considered. No convincing ventricular wall or septal distortion from mass-effect. Mediastinum/Nodes: Diffuse atherosclerotic calcification. No noncontrast evidence of acute vascular disease. Lungs/Pleura: Moderate layering pleural effusions, larger on the left. Multi segment atelectasis. Musculoskeletal: No acute or aggressive finding. Exaggerated thoracic kyphosis with spondylosis with multi-level ankylosis. CT ABDOMEN PELVIS FINDINGS Hepatobiliary: No focal liver abnormality.Extensive calcification in the gallbladder fossa. This is likely luminal and from large gallstone rather than mural calcification. No common bile duct dilatation. Pancreas: Unremarkable. Spleen: Unremarkable. Adrenals/Urinary Tract: Negative adrenals. Smooth bilateral renal atrophy. No hydronephrosis. Cortical and hilar cyst at the left inferior pole. The bladder is decompressed around a Foley catheter. Stomach/Bowel:  No obstruction. Status post right hemicolectomy Vascular/Lymphatic: Diffuse atherosclerotic calcification. No mass or adenopathy. Reproductive:Symmetric prostate enlargement. Other: Small non loculated ascites and Extensive body wall edema. Small inguinal hernias, fatty on the left and ascitic on the right. Musculoskeletal: No acute abnormalities. Diffuse degenerative change. IMPRESSION: 1. Moderate pericardial effusion not noted on recent echocardiography. Neighboring fat is reticulated and pericarditis should be considered. 2. Volume overload with marked anasarca, moderate bilateral pleural effusion, and small ascites. 3. Multi segment atelectasis. Electronically Signed   By: Monte Fantasia M.D.   On: 09/10/2016 16:53     Medications:    . apixaban  2.5 mg Oral BID  . atorvastatin  20 mg Oral q1800  . hydrocerin  1 application Topical Once per day on Tue Fri  .  insulin aspart  0-5 Units Subcutaneous QHS  . insulin aspart  0-9 Units Subcutaneous TID WC  . iopamidol  30 mL Oral Once  . ipratropium-albuterol  3 mL Nebulization TID  . nystatin  1 g Topical BID  . piperacillin-tazobactam (ZOSYN)  IV  2.25 g Intravenous Q8H  . polyethylene glycol  17 g Oral Daily  . sodium chloride flush  3 mL Intravenous Q12H  . tamsulosin  0.4 mg Oral QPC supper  . [START ON 09/12/2016] vancomycin  1,500 mg Intravenous Q48H   sodium chloride, acetaminophen, bisacodyl, gi cocktail, HYDROcodone-acetaminophen, ondansetron (ZOFRAN) IV, sodium chloride flush  Assessment/ Plan:   Acute on Chronic Renal Failure  The differential would be ischemic ATN, AIN or Acute GN, there is nothing to suggest obstruction. Urine has to numerous to count rbc and the wbc is 6-30 with some mild proteinuria. It appears that the worsening renal pathology is at least related to some decrease in BP and a clinically declining picture despite diuresis. A renal biopsy may be helpful although not practical at this point. My overall opinion would be that the decline is due to a worsening septic picture. The abdominal CT was consistent with anasarca and not indicative of ischemic bowel.   Reviewed Cardiology opinion and agree with diuresis as patient does appear markedly volume overloaded and his hemodynamics have significantly improved since yesterday  Will continue to maintain close monitoring of Is and Os      LOS: 6  Eriko Economos W @TODAY @3 :12 PM

## 2016-09-12 DIAGNOSIS — D72829 Elevated white blood cell count, unspecified: Secondary | ICD-10-CM

## 2016-09-12 LAB — CBC WITH DIFFERENTIAL/PLATELET
BASOS PCT: 0 %
Basophils Absolute: 0 10*3/uL (ref 0.0–0.1)
EOS ABS: 0.1 10*3/uL (ref 0.0–0.7)
EOS PCT: 1 %
HCT: 23 % — ABNORMAL LOW (ref 39.0–52.0)
HEMOGLOBIN: 7 g/dL — AB (ref 13.0–17.0)
Lymphocytes Relative: 21 %
Lymphs Abs: 2.8 10*3/uL (ref 0.7–4.0)
MCH: 23.6 pg — ABNORMAL LOW (ref 26.0–34.0)
MCHC: 30.4 g/dL (ref 30.0–36.0)
MCV: 77.7 fL — ABNORMAL LOW (ref 78.0–100.0)
MONO ABS: 4.1 10*3/uL — AB (ref 0.1–1.0)
MONOS PCT: 31 %
NEUTROS PCT: 47 %
Neutro Abs: 6.2 10*3/uL (ref 1.7–7.7)
PLATELETS: 106 10*3/uL — AB (ref 150–400)
RBC: 2.96 MIL/uL — ABNORMAL LOW (ref 4.22–5.81)
RDW: 17.1 % — AB (ref 11.5–15.5)
WBC: 13.3 10*3/uL — ABNORMAL HIGH (ref 4.0–10.5)

## 2016-09-12 LAB — COMPREHENSIVE METABOLIC PANEL
ALT: 9 U/L — ABNORMAL LOW (ref 17–63)
ANION GAP: 9 (ref 5–15)
AST: 7 U/L — ABNORMAL LOW (ref 15–41)
Albumin: 2.7 g/dL — ABNORMAL LOW (ref 3.5–5.0)
Alkaline Phosphatase: 51 U/L (ref 38–126)
BUN: 65 mg/dL — ABNORMAL HIGH (ref 6–20)
CHLORIDE: 104 mmol/L (ref 101–111)
CO2: 23 mmol/L (ref 22–32)
Calcium: 8 mg/dL — ABNORMAL LOW (ref 8.9–10.3)
Creatinine, Ser: 4.07 mg/dL — ABNORMAL HIGH (ref 0.61–1.24)
GFR calc non Af Amer: 13 mL/min — ABNORMAL LOW (ref >60)
GFR, EST AFRICAN AMERICAN: 15 mL/min — AB (ref >60)
GLUCOSE: 155 mg/dL — AB (ref 65–99)
POTASSIUM: 5.1 mmol/L (ref 3.5–5.1)
SODIUM: 136 mmol/L (ref 135–145)
Total Bilirubin: 0.7 mg/dL (ref 0.3–1.2)
Total Protein: 6 g/dL — ABNORMAL LOW (ref 6.5–8.1)

## 2016-09-12 LAB — URINALYSIS, ROUTINE W REFLEX MICROSCOPIC
BILIRUBIN URINE: NEGATIVE
Glucose, UA: NEGATIVE mg/dL
KETONES UR: NEGATIVE mg/dL
LEUKOCYTES UA: NEGATIVE
Nitrite: NEGATIVE
PH: 5 (ref 5.0–8.0)
Protein, ur: NEGATIVE mg/dL
Specific Gravity, Urine: 1.011 (ref 1.005–1.030)

## 2016-09-12 LAB — GLUCOSE, CAPILLARY
GLUCOSE-CAPILLARY: 137 mg/dL — AB (ref 65–99)
GLUCOSE-CAPILLARY: 136 mg/dL — AB (ref 65–99)
Glucose-Capillary: 162 mg/dL — ABNORMAL HIGH (ref 65–99)
Glucose-Capillary: 144 mg/dL — ABNORMAL HIGH (ref 65–99)

## 2016-09-12 LAB — MAGNESIUM: Magnesium: 1.6 mg/dL — ABNORMAL LOW (ref 1.7–2.4)

## 2016-09-12 LAB — PHOSPHORUS: PHOSPHORUS: 5.8 mg/dL — AB (ref 2.5–4.6)

## 2016-09-12 MED ORDER — FUROSEMIDE 10 MG/ML IJ SOLN
160.0000 mg | Freq: Four times a day (QID) | INTRAVENOUS | Status: DC
  Administered 2016-09-12 – 2016-09-15 (×12): 160 mg via INTRAVENOUS
  Filled 2016-09-12: qty 2
  Filled 2016-09-12 (×3): qty 16
  Filled 2016-09-12: qty 2
  Filled 2016-09-12: qty 16
  Filled 2016-09-12: qty 4
  Filled 2016-09-12 (×4): qty 16
  Filled 2016-09-12: qty 10
  Filled 2016-09-12: qty 16

## 2016-09-12 MED ORDER — IPRATROPIUM-ALBUTEROL 0.5-2.5 (3) MG/3ML IN SOLN: 3.0000 mL | Freq: Four times a day (QID) | RESPIRATORY_TRACT | Status: DC | PRN

## 2016-09-12 MED ORDER — DARBEPOETIN ALFA 150 MCG/0.3ML IJ SOSY
150.0000 ug | PREFILLED_SYRINGE | INTRAMUSCULAR | Status: DC
  Administered 2016-09-12: 150 ug via SUBCUTANEOUS
  Filled 2016-09-12: qty 0.3

## 2016-09-12 NOTE — Progress Notes (Signed)
Subjective:  UOP up some- had 875 out last night,  but still had more in than out - creatinine higher Objective Vital signs in last 24 hours: Vitals:   09/12/16 0500 09/12/16 0600 09/12/16 0800 09/12/16 1000  BP: (!) 125/54 (!) 125/45    Pulse: (!) 58 60 (!) 57   Resp: 18 (!) 23 16   Temp:    98.6 F (37 C)  TempSrc:   Oral   SpO2: 100% 93% 91%   Weight:  123.7 kg (272 lb 11.3 oz)    Height:       Weight change: 0.9 kg (1 lb 15.7 oz)  Intake/Output Summary (Last 24 hours) at 09/12/16 1248 Last data filed at 09/12/16 0600  Gross per 24 hour  Intake              472 ml  Output              875 ml  Net             -403 ml    Assessment/ Plan: Pt is a 83 y.o. yo male who was admitted on 09/05/2016 with volume overload, possible BOO now with foley as well as A on CRF- baseline creatinine 2.5 Assessment/Plan: 1. AKI- initially pt diuresed with good results and creatinine trended down.  Then around 1/15-1/16 UOP dropped off and creatinine started to go up again. Ultrasound did not show obstruction.  This was coincident with lower BPs.  DDX is ATN or less likely acute GN with TNTC RBCs in urine (although maybe due to foley).  Will recheck urine to see if clears.  Clinically is a little better with WBC down so will see if creatinine follows suit.  Given overall overload, will increase lasix and continue to follow- do not feel dialysis indications at this point. MS seems to be an issue- paranoid ?  Will certainly make anything like dialysis very difficult  2. CKD- at baseline with creatinine of 2.5  3. Anemia- hgb in the 7's. On vanc /zosyn due to sepsis type picture- no iron but will give ESA 4. HTN/volume- overloaded but with soft BP- diurese as able- stop all antihypertensives including flomax  Kumar Falwell A    Labs: Basic Metabolic Panel:  Recent Labs Lab 09/10/16 0820 09/11/16 0329 09/12/16 0350  NA 137 137 136  K 5.3* 5.5* 5.1  CL 105 106 104  CO2 22 23 23   GLUCOSE  171* 123* 155*  BUN 58* 63* 65*  CREATININE 3.66* 3.69* 4.07*  CALCIUM 8.2* 8.0* 8.0*  PHOS  --   --  5.8*   Liver Function Tests:  Recent Labs Lab 09/12/16 0350  AST 7*  ALT 9*  ALKPHOS 51  BILITOT 0.7  PROT 6.0*  ALBUMIN 2.7*   No results for input(s): LIPASE, AMYLASE in the last 168 hours. No results for input(s): AMMONIA in the last 168 hours. CBC:  Recent Labs Lab 09/09/16 0546 09/10/16 0820 09/10/16 1129 09/11/16 0329 09/12/16 0350  WBC 10.4 24.4* 23.7* 15.3* 13.3*  NEUTROABS  --   --   --   --  6.2  HGB 7.4* 7.7* 7.7* 7.1* 7.0*  HCT 24.1* 24.8* 25.1* 22.8* 23.0*  MCV 78.0 78.0 78.4 77.6* 77.7*  PLT 95* 96* 96* 95* 106*   Cardiac Enzymes:  Recent Labs Lab 09/05/16 2211 09/06/16 0402 09/06/16 1052 09/09/16 1728 09/10/16 0820  TROPONINI 0.03* 0.04* 0.03* <0.03 <0.03   CBG:  Recent Labs Lab 09/11/16 1212 09/11/16 1702  09/11/16 2126 09/12/16 0835 09/12/16 1243  GLUCAP 141* 178* 182* 162* 144*    Iron Studies: No results for input(s): IRON, TIBC, TRANSFERRIN, FERRITIN in the last 72 hours. Studies/Results: Ct Abdomen Pelvis Wo Contrast  Result Date: 09/10/2016 CLINICAL DATA:  CHF.  Leukocytosis. EXAM: CT CHEST, ABDOMEN AND PELVIS WITHOUT CONTRAST TECHNIQUE: Multidetector CT imaging of the chest, abdomen and pelvis was performed following the standard protocol without IV contrast. COMPARISON:  Chest CT 08/29/2015 FINDINGS: CT CHEST FINDINGS Cardiovascular: Chronic cardiomegaly. There is a moderate pericardial effusion that is new from echocardiogram report 09/06/2016. Some of the fat around the effusion is infiltrated and pericarditis is considered. No convincing ventricular wall or septal distortion from mass-effect. Mediastinum/Nodes: Diffuse atherosclerotic calcification. No noncontrast evidence of acute vascular disease. Lungs/Pleura: Moderate layering pleural effusions, larger on the left. Multi segment atelectasis. Musculoskeletal: No acute or  aggressive finding. Exaggerated thoracic kyphosis with spondylosis with multi-level ankylosis. CT ABDOMEN PELVIS FINDINGS Hepatobiliary: No focal liver abnormality.Extensive calcification in the gallbladder fossa. This is likely luminal and from large gallstone rather than mural calcification. No common bile duct dilatation. Pancreas: Unremarkable. Spleen: Unremarkable. Adrenals/Urinary Tract: Negative adrenals. Smooth bilateral renal atrophy. No hydronephrosis. Cortical and hilar cyst at the left inferior pole. The bladder is decompressed around a Foley catheter. Stomach/Bowel:  No obstruction. Status post right hemicolectomy Vascular/Lymphatic: Diffuse atherosclerotic calcification. No mass or adenopathy. Reproductive:Symmetric prostate enlargement. Other: Small non loculated ascites and Extensive body wall edema. Small inguinal hernias, fatty on the left and ascitic on the right. Musculoskeletal: No acute abnormalities. Diffuse degenerative change. IMPRESSION: 1. Moderate pericardial effusion not noted on recent echocardiography. Neighboring fat is reticulated and pericarditis should be considered. 2. Volume overload with marked anasarca, moderate bilateral pleural effusion, and small ascites. 3. Multi segment atelectasis. Electronically Signed   By: Monte Fantasia M.D.   On: 09/10/2016 16:53   Dg Chest 2 View  Result Date: 09/10/2016 CLINICAL DATA:  Leukocytosis. EXAM: CHEST  2 VIEW COMPARISON:  09/09/2016. FINDINGS: Cardiomegaly with pulmonary vascular prominence and bilateral pulmonary infiltrates. Bilateral pleural effusions. Findings consistent congestive heart failure. Bilateral pneumonia cannot be excluded. Findings progressed from 09/09/2016. IMPRESSION: Congestive heart failure with bilateral pulmonary edema and bilateral pleural effusions, progressed from prior exam. Bilateral pneumonia cannot be excluded. Electronically Signed   By: Marcello Moores  Register   On: 09/10/2016 14:48   Dg Abd 1  View  Result Date: 09/10/2016 CLINICAL DATA:  Leukocytosis. EXAM: ABDOMEN - 1 VIEW COMPARISON:  Ultrasound 09/06/2016.  Ultrasound 11/24/2014. FINDINGS: Soft tissue structures are unremarkable. Surgical sutures noted over the right abdomen. Rounded calcification right upper quadrant consistent with cholelithiasis. Mildly prominent air -filled loops of small and large bowel noted. Adynamic ileus cannot be excluded. No free air. No acute bony or joint abnormality identified. Vascular calcification. IMPRESSION: 1. Mildly prominent air-filled loops of small and large bowel noted. Mild adynamic ileus cannot be excluded. Follow-up abdominal series to exclude bowel obstruction. 2. Prominent calcific density right upper quadrant most likely large gallstone. Electronically Signed   By: Marcello Moores  Register   On: 09/10/2016 14:52   Ct Chest Wo Contrast  Result Date: 09/10/2016 CLINICAL DATA:  CHF.  Leukocytosis. EXAM: CT CHEST, ABDOMEN AND PELVIS WITHOUT CONTRAST TECHNIQUE: Multidetector CT imaging of the chest, abdomen and pelvis was performed following the standard protocol without IV contrast. COMPARISON:  Chest CT 08/29/2015 FINDINGS: CT CHEST FINDINGS Cardiovascular: Chronic cardiomegaly. There is a moderate pericardial effusion that is new from echocardiogram report 09/06/2016. Some of the fat around  the effusion is infiltrated and pericarditis is considered. No convincing ventricular wall or septal distortion from mass-effect. Mediastinum/Nodes: Diffuse atherosclerotic calcification. No noncontrast evidence of acute vascular disease. Lungs/Pleura: Moderate layering pleural effusions, larger on the left. Multi segment atelectasis. Musculoskeletal: No acute or aggressive finding. Exaggerated thoracic kyphosis with spondylosis with multi-level ankylosis. CT ABDOMEN PELVIS FINDINGS Hepatobiliary: No focal liver abnormality.Extensive calcification in the gallbladder fossa. This is likely luminal and from large gallstone  rather than mural calcification. No common bile duct dilatation. Pancreas: Unremarkable. Spleen: Unremarkable. Adrenals/Urinary Tract: Negative adrenals. Smooth bilateral renal atrophy. No hydronephrosis. Cortical and hilar cyst at the left inferior pole. The bladder is decompressed around a Foley catheter. Stomach/Bowel:  No obstruction. Status post right hemicolectomy Vascular/Lymphatic: Diffuse atherosclerotic calcification. No mass or adenopathy. Reproductive:Symmetric prostate enlargement. Other: Small non loculated ascites and Extensive body wall edema. Small inguinal hernias, fatty on the left and ascitic on the right. Musculoskeletal: No acute abnormalities. Diffuse degenerative change. IMPRESSION: 1. Moderate pericardial effusion not noted on recent echocardiography. Neighboring fat is reticulated and pericarditis should be considered. 2. Volume overload with marked anasarca, moderate bilateral pleural effusion, and small ascites. 3. Multi segment atelectasis. Electronically Signed   By: Monte Fantasia M.D.   On: 09/10/2016 16:53   Medications: Infusions:   Scheduled Medications: . apixaban  2.5 mg Oral BID  . atorvastatin  20 mg Oral q1800  . furosemide  120 mg Intravenous Q12H  . hydrocerin  1 application Topical Once per day on Tue Fri  . insulin aspart  0-5 Units Subcutaneous QHS  . insulin aspart  0-9 Units Subcutaneous TID WC  . iopamidol  30 mL Oral Once  . ipratropium-albuterol  3 mL Nebulization TID  . nystatin  1 g Topical BID  . piperacillin-tazobactam (ZOSYN)  IV  2.25 g Intravenous Q8H  . polyethylene glycol  17 g Oral Daily  . sodium chloride flush  3 mL Intravenous Q12H  . tamsulosin  0.4 mg Oral QPC supper  . vancomycin  1,500 mg Intravenous Q48H    have reviewed scheduled and prn medications.  Physical Exam:  General:Confused- angry Heart: RRR Lungs: dec BS at bases Abdomen: soft, non tender Extremities: pitting edema Dialysis Access: none yet     09/12/2016,12:48 PM  LOS: 7 days

## 2016-09-12 NOTE — Progress Notes (Signed)
Patient Name: [name] Date of Encounter: 09/12/2016  Primary Cardiologist: Dr [facility] Problem List     Principal Problem:   Acute on chronic diastolic congestive heart failure (HCC) Active Problems:   Lymphedema   Acute renal failure superimposed on stage 3 chronic kidney disease (HCC)   Chronic diastolic CHF (congestive heart failure) (HCC)   Anemia of chronic renal failure, stage 3 (moderate)   Uncontrolled diabetes mellitus with diabetic nephropathy, with long-term current use of insulin (HCC)   PAF (paroxysmal atrial fibrillation) (HCC)   Atrial flutter (HCC)   Bradycardia   AKI (acute kidney injury) ([place])   HTN (hypertension)   Severe obesity (BMI >= 40) (HCC)   Mobitz type 1 second degree AV block   Pericardial effusion- moderate on CT scan     Subjective   Upset- thinks he was supposed to go home today.  Inpatient Medications    Scheduled Meds: . apixaban  2.5 mg Oral BID  . atorvastatin  20 mg Oral q1800  . furosemide  120 mg Intravenous Q12H  . hydrocerin  1 application Topical Once per day on Tue Fri  . insulin aspart  0-5 Units Subcutaneous QHS  . insulin aspart  0-9 Units Subcutaneous TID WC  . iopamidol  30 mL Oral Once  . ipratropium-albuterol  3 mL Nebulization TID  . nystatin  1 g Topical BID  . piperacillin-tazobactam (ZOSYN)  IV  2.25 g Intravenous Q8H  . polyethylene glycol  17 g Oral Daily  . sodium chloride flush  3 mL Intravenous Q12H  . tamsulosin  0.4 mg Oral QPC supper  . vancomycin  1,500 mg Intravenous Q48H   Continuous Infusions:  PRN Meds: sodium chloride, acetaminophen, bisacodyl, gi cocktail, HYDROcodone-acetaminophen, ondansetron (ZOFRAN) IV, sodium chloride flush   Vital Signs    Vitals:   09/12/16 0358 09/12/16 0400 09/12/16 0500 09/12/16 0600  BP:  (!) 146/62 (!) 125/54 (!) 125/45  Pulse:  (!) 59 (!) 58 60  Resp:  14 18 (!) 23  Temp: 98.6 F (37 C)     TempSrc: Oral     SpO2:  99% 100% 93%    Weight:    272 lb 11.3 oz (123.7 kg)  Height:        Intake/Output Summary (Last 24 hours) at 09/12/16 0841 Last data filed at 09/12/16 0600  Gross per 24 hour  Intake              742 ml  Output              875 ml  Net             -133 ml   Filed Weights   09/10/16 0439 09/11/16 0428 09/12/16 0600  Weight: 264 lb 12.4 oz (120.1 kg) 270 lb 11.6 oz (122.8 kg) 272 lb 11.3 oz (123.7 kg)    Physical Exam    GEN: Obese AA male in no acute distress.  HEENT: Grossly normal.  Neck: Supple, no JVD, carotid bruits, or masses. Cardiac: RRR, no murmurs, rubs, or gallops. No clubbing, cyanosis, edema.  Respiratory:  Decreased breath sounds Skin: warm and dry, no rash. Neuro:  Strength and sensation are intact. Psych: AAOx3.  A little paranoid this am  Labs    CBC  Recent Labs  09/11/16 0329 09/12/16 0350  WBC 15.3* 13.3*  NEUTROABS  --  6.2  HGB 7.1* 7.0*  HCT 22.8* 23.0*  MCV 77.6* 77.7*  PLT 95* 106*  Basic Metabolic Panel  Recent Labs  09/11/16 0329 09/12/16 0350  NA 137 136  K 5.5* 5.1  CL 106 104  CO2 23 23  GLUCOSE 123* 155*  BUN 63* 65*  CREATININE 3.69* 4.07*  CALCIUM 8.0* 8.0*  MG  --  1.6*  PHOS  --  5.8*   Liver Function Tests  Recent Labs  09/12/16 0350  AST 7*  ALT 9*  ALKPHOS 51  BILITOT 0.7  PROT 6.0*  ALBUMIN 2.7*   No results for input(s): LIPASE, AMYLASE in the last 72 hours. Cardiac Enzymes  Recent Labs  09/09/16 1728 09/10/16 0820  TROPONINI <0.03 <0.03     Telemetry    NSR/ Wenkebach- Personally Reviewed  ECG    Type 1 second degree AVB -   Radiology    Ct Abdomen Pelvis Wo Contrast  Result Date: 09/10/2016 CLINICAL DATA:  CHF.  Leukocytosis. EXAM: CT CHEST, ABDOMEN AND PELVIS WITHOUT CONTRAST TECHNIQUE: Multidetector CT imaging of the chest, abdomen and pelvis was performed following the standard protocol without IV contrast. COMPARISON:  Chest CT 08/29/2015 FINDINGS: CT CHEST FINDINGS Cardiovascular: Chronic  cardiomegaly. There is a moderate pericardial effusion that is new from echocardiogram report 09/06/2016. Some of the fat around the effusion is infiltrated and pericarditis is considered. No convincing ventricular wall or septal distortion from mass-effect. Mediastinum/Nodes: Diffuse atherosclerotic calcification. No noncontrast evidence of acute vascular disease. Lungs/Pleura: Moderate layering pleural effusions, larger on the left. Multi segment atelectasis. Musculoskeletal: No acute or aggressive finding. Exaggerated thoracic kyphosis with spondylosis with multi-level ankylosis. CT ABDOMEN PELVIS FINDINGS Hepatobiliary: No focal liver abnormality.Extensive calcification in the gallbladder fossa. This is likely luminal and from large gallstone rather than mural calcification. No common bile duct dilatation. Pancreas: Unremarkable. Spleen: Unremarkable. Adrenals/Urinary Tract: Negative adrenals. Smooth bilateral renal atrophy. No hydronephrosis. Cortical and hilar cyst at the left inferior pole. The bladder is decompressed around a Foley catheter. Stomach/Bowel:  No obstruction. Status post right hemicolectomy Vascular/Lymphatic: Diffuse atherosclerotic calcification. No mass or adenopathy. Reproductive:Symmetric prostate enlargement. Other: Small non loculated ascites and Extensive body wall edema. Small inguinal hernias, fatty on the left and ascitic on the right. Musculoskeletal: No acute abnormalities. Diffuse degenerative change. IMPRESSION: 1. Moderate pericardial effusion not noted on recent echocardiography. Neighboring fat is reticulated and pericarditis should be considered. 2. Volume overload with marked anasarca, moderate bilateral pleural effusion, and small ascites. 3. Multi segment atelectasis. Electronically Signed   By: Monte Fantasia M.D.   On: 09/10/2016 16:53   Dg Chest 2 View  Result Date: 09/10/2016 CLINICAL DATA:  Leukocytosis. EXAM: CHEST  2 VIEW COMPARISON:  09/09/2016. FINDINGS:  Cardiomegaly with pulmonary vascular prominence and bilateral pulmonary infiltrates. Bilateral pleural effusions. Findings consistent congestive heart failure. Bilateral pneumonia cannot be excluded. Findings progressed from 09/09/2016. IMPRESSION: Congestive heart failure with bilateral pulmonary edema and bilateral pleural effusions, progressed from prior exam. Bilateral pneumonia cannot be excluded. Electronically Signed   By: Marcello Moores  Register   On: 09/10/2016 14:48   Dg Abd 1 View  Result Date: 09/10/2016 CLINICAL DATA:  Leukocytosis. EXAM: ABDOMEN - 1 VIEW COMPARISON:  Ultrasound 09/06/2016.  Ultrasound 11/24/2014. FINDINGS: Soft tissue structures are unremarkable. Surgical sutures noted over the right abdomen. Rounded calcification right upper quadrant consistent with cholelithiasis. Mildly prominent air -filled loops of small and large bowel noted. Adynamic ileus cannot be excluded. No free air. No acute bony or joint abnormality identified. Vascular calcification. IMPRESSION: 1. Mildly prominent air-filled loops of small and large bowel noted. Mild adynamic  ileus cannot be excluded. Follow-up abdominal series to exclude bowel obstruction. 2. Prominent calcific density right upper quadrant most likely large gallstone. Electronically Signed   By: Marcello Moores  Register   On: 09/10/2016 14:52   Ct Chest Wo Contrast  Result Date: 09/10/2016 CLINICAL DATA:  CHF.  Leukocytosis. EXAM: CT CHEST, ABDOMEN AND PELVIS WITHOUT CONTRAST TECHNIQUE: Multidetector CT imaging of the chest, abdomen and pelvis was performed following the standard protocol without IV contrast. COMPARISON:  Chest CT 08/29/2015 FINDINGS: CT CHEST FINDINGS Cardiovascular: Chronic cardiomegaly. There is a moderate pericardial effusion that is new from echocardiogram report 09/06/2016. Some of the fat around the effusion is infiltrated and pericarditis is considered. No convincing ventricular wall or septal distortion from mass-effect.  Mediastinum/Nodes: Diffuse atherosclerotic calcification. No noncontrast evidence of acute vascular disease. Lungs/Pleura: Moderate layering pleural effusions, larger on the left. Multi segment atelectasis. Musculoskeletal: No acute or aggressive finding. Exaggerated thoracic kyphosis with spondylosis with multi-level ankylosis. CT ABDOMEN PELVIS FINDINGS Hepatobiliary: No focal liver abnormality.Extensive calcification in the gallbladder fossa. This is likely luminal and from large gallstone rather than mural calcification. No common bile duct dilatation. Pancreas: Unremarkable. Spleen: Unremarkable. Adrenals/Urinary Tract: Negative adrenals. Smooth bilateral renal atrophy. No hydronephrosis. Cortical and hilar cyst at the left inferior pole. The bladder is decompressed around a Foley catheter. Stomach/Bowel:  No obstruction. Status post right hemicolectomy Vascular/Lymphatic: Diffuse atherosclerotic calcification. No mass or adenopathy. Reproductive:Symmetric prostate enlargement. Other: Small non loculated ascites and Extensive body wall edema. Small inguinal hernias, fatty on the left and ascitic on the right. Musculoskeletal: No acute abnormalities. Diffuse degenerative change. IMPRESSION: 1. Moderate pericardial effusion not noted on recent echocardiography. Neighboring fat is reticulated and pericarditis should be considered. 2. Volume overload with marked anasarca, moderate bilateral pleural effusion, and small ascites. 3. Multi segment atelectasis. Electronically Signed   By: Monte Fantasia M.D.   On: 09/10/2016 16:53    Cardiac Studies   Repeat echo 09/11/16 Study Conclusions  - Left ventricle: The cavity size was normal. Systolic function was   normal. The estimated ejection fraction was in the range of 60%   to 65%. Although no diagnostic regional wall motion abnormality   was identified, this possibility cannot be completely excluded on   the basis of this study. - Right ventricle: The  cavity size was normal. Systolic function   was normal. - Systemic veins: IVC measured 2.5 cm with < 50% respirophasic   variation, suggesting RA pressure 15 mmHg. - Pericardium, extracardiac: Small circumferential pericardial   effusion. I do not think there is tamponade present. The effusion   is small, there is no RV diastolic collapse. The IVC is dilated   but this may be due to volume overload.   Patient Profile     83 y.o.year old AA malewith a history of HTN, HLD, DM, CKD III, diastolic CHF, CVA in April 2017, chronic lymphedema, and PAF with slow VR. The pt is a SNF resident. He was seen in 2013 by cards for preop eval and troponin up to 1.20 in the setting of a fall w/ anemia, colon mass and injuries. EF 40-45%. Med rx recommended.   We next saw him in April 2017 when he presented with a CVA and fall (he had declined Eliquis for PAF a few months earlier). He was bradycardic then - in and out of AF-but felt to be a poor candidate for a pacemaker secondary to co morbidities.   He is admitted now from SNF with urinary retention, increased  dyspnea, and worsening LE edema. His SCr was 3.67 on adm, baseline 2.5. Echo shows preserved LVF- EF 55-60% with moderate LVH. CT scan 09/10/16 showed a moderate pericardial effusion "? pericarditis". On telemetry he has had periods of AF with slow VR, and variable AVB and Wenckebach. Overall rates in the 45-70 range.    Assessment & Plan    1 acute on chronic diastolic congestive heart failure-patient remains volume overloaded on examination. This is likely a combination of both diastolic congestive heart failure with component of renal insufficiency as well. Dr. Justin Mend has held his diuretic. However he will likely need further diuresis.  2 pericardial effusion-moderate on CT scan by interpretation. Limited images today show small pericardial effusion and I do not believe this is contributing to his present symptoms. Possibly secondary to renal  insufficiency.  3 Mobitz 1 second-degree AV block-noted on telemetry. Would discontinue beta blocker. He is not having symptoms. No indication for pacemaker. Follow on telemetry.  4 paroxysmal atrial fibrillation-will hold Coreg as noted above. Continue apixaban (CHADSvasc 8).  5 acute on chronic stage IV kidney disease-nephrology managing.  6 hypertension-blood pressure medications on hold per nephrology as they feel hypoperfusion may be contributing to worsening renal function. He will likely need a lower dose initiated.  7 elevated white blood cell count/question infection-per primary care.  Plan: Avoid AV nodal blocking agents. F/U Dr Oval Linsey or her APP in two weeks to be arranged.   Signed, Kerin Ransom, PA-C  09/12/2016, 8:41 AM   As above, patient seen and examined. He denies dyspnea or chest pain. Patient remains markedly volume overloaded likely related more to renal insufficiency but also with possible contribution from diastolic congestive heart failure. He has been started on Lasix by Dr. Justin Mend. Will need close follow-up of renal function. Not clear he would be a good candidate for dialysis. His pericardial effusion is small and I do not think contributing to present clinical picture. This morning he is in sinus with nonconducted PACs. Would continue to avoid beta blocker. There is no indication for pacemaker. Predominant issue is volume which is being addressed by nephrology. Cardiology will sign off. Please call with questions.  Kirk Ruths, MD

## 2016-09-12 NOTE — Progress Notes (Signed)
PROGRESS NOTE    [name]  [id number] DOB: [birth year] DOA: 09/05/2016 PCP: [name], PA   Brief Narrative:  [name] a 83 y.o.malewith PMH significant for chronic diastolic HF, CKD stage, 3, obesity, LE lymphedema, CAD, HTN, atrial fibrillation (on eliquis), HLD and diabetes with nephropathy; who presented to ED from his SNF due to SOB, worsening LE edema and inability to urinate. Patient endorses been unable to pee despite lasix for the last 14 hours or so. Also has experienced SOB and orthopnea for the last 2-3 days, along with worsening on his bilateral LE edema (now unable to pick his left up on his own or to walk properly due to swelling). He denies CP, Fever, HA's, blurred vision, abd pain, dysuria, hematuria, melena, hematochezia or any other complaints. Currently being treated for Heart Failure Exacerbation with Anasarca. Cardiology and Nephrology are consulted and following. Still being diuresed and remains extremely volume overloaded.   Assessment & Plan:   Principal Problem:   Acute on chronic diastolic congestive heart failure (HCC) Active Problems:   Lymphedema   Acute renal failure superimposed on stage 3 chronic kidney disease (HCC)   Chronic diastolic CHF (congestive heart failure) (HCC)   Anemia of chronic renal failure, stage 3 (moderate)   Uncontrolled diabetes mellitus with diabetic nephropathy, with long-term current use of insulin (HCC)   PAF (paroxysmal atrial fibrillation) (HCC)   Atrial flutter (HCC)   Bradycardia   AKI (acute kidney injury) ([place])   HTN (hypertension)   Severe obesity (BMI >= 40) (HCC)   Mobitz type 1 second degree AV block   Pericardial effusion- moderate on CT scan  Acute on Chronic diastolic heart failure -previous echocardiogram noted grade 2 diastolic dysfunction (Q000111Q) and systolic dysfunction (0000000) -Echocardiogram showed EF 55-60%, left ventricular diastolic function parameters are normal, left atrium was  moderately to severely dilated -Cardiology Repeated ECHO; Showed IVC is Dilated possibly to Volume Overload -Not sure if patient's SOB and LE edema is due to CHF exacerbation vs undiagnosed sleep apnea? -BNP on admission 557 -Chest x-ray showed small left pleural effusion with primary congestion -Continue IV Lasix, Lasix dose increased by Nephrology to 160 mg IV q6h -Monitor intake and output, daily weights -Currently no ACEi or ARB due to renal function -Appreciated Cardiology Recc's -Continue to monitor Volume Status  Acute renal failure superimposed on CKD, stage IV -due to poor perfusion from CHF exacerbation, plus component of urinary retention most likely from BPH -Creatinine upon admission 3.67, baseline 2.6-2.9 -Creatinine today 4.07 -Renal US showed no hydronephrosis -follow intake/output -D/C'd IV Vanc and Zosyn as serum Cr was worsening  -Nephrology Following and patient being Diuresed with 160 mg of IV Lasix q6h -Nephrology does not feel like dialysis is indicated at this point   Pericardial Effusion -Cardiology Following -Repeat ECHO showed it was small and no RV Diastolic Collapse; Cardiology does not feel it is contributing to present clinical picture -Continue to Monitor  Leukocytosis -Improving; WBC down to 13.3 from 24 -UA and CXR were unremarkable for infection -repeat UA Negative, CXR showed CHF -Blood cultures x2 showed NGTD at 2 days -Currently afebrile -Was on IV Vanc and IV Zosyn but Discontinued for now -Abd xray showed mild prominent air-filled loops of small and large bowel, mild adynamic ileus cannot be excluded.  -CT of Chest showed Moderate Pericardial Effusion with ?pericarditis; Volume Overload with Marked Anasarca, Moderate B/L Pleural Effusions and Small Ascites Noted. Multiple Segment Atelectacasis noded -Continue to Monitor  Hyperkalemia -Mild; Given Kayexelate;  K+ is now 5.1  -Should Corrrect with Diuresis  -Repeat BMP in AM  Anemia of  chronic renal failure, stage 3  -Hgb 7.0 down from 7.7 -no signs of acute bleeding -will monitor trend and if Hgb less than 7 will need transfusion  -FOBT x3 pending  -Nephrology started Aranesep 150 mcg sq every Thursday -Continue to monitor CBC (may improve with diuresis)  Diabetes mellitus, type II with nephropathy  -Continue insulin size, CBG monitoring; Last CBG's have ranged from 137-162 -Lantus and glipizide held -Hemoglobin A1c 6.4  Paroxysmal Atrial fibrillation with Conversion to Mobitz Second-Degree AV block -Per Cardiology would Avoid BB and AV Nodal Blocking Agents  -Per Cardiology no indication of Pacemaker at this point  -Continue eliquis 2.5 mg po BID -CHADSVASC at least 4 (given age, h/o dCHF, DM, HTN) -Cardiology Following  Essential Hypertension -All BP Meds held including Hydralazine, Coreg, Imdur -IV Lasix increased by Nephrology  CAD/Elevated troponin -no CP; but SOB could be equivalent to Angina -troponin peaked at 0.04, Currently flat and less than <0.03; unlikely ACS.  -Possibly secondary to demand. -Echocardiogram as listed above -Continue coreg, lipitor and Imdur, apixaban -Cardiology Following  Hyperlipidemia -Continue statin  Acute urinary retention -as mentioned above; foley catheter placed while in ED -Flomax discontinued by Nephrology  Severe obesity  -Body mass index is 38.67 kg/m. -Will need to follow up with PCP upon discharge to discuss lifestyle modifications -Nutrition consulted -patient should have a sleep study as an outpateint  Deconditioning -Not sure where patient is from.  H&P noted SNF, social work/case management noted Hotel.  -PT and OT consulted, recommended SNF once stable for D/C  Lower extremity edema -Unna boots in place, wound care consulted -Diuresis as above  Constipation -Improved -Continue bowel regimen  DVT prophylaxis: Anticoagulated with Apixaban Code Status: FULL CODE Family  Communication: No Family present at bedside Disposition Plan: Remain in SDU and transfer to [facility] if stable in AM  Consultants:   Nephrology  Cardiology  Procedures: Echocardiogram Renal Ultrasound  Antimicrobials: IV Vancomycin and IV Zosyn D/C'd  Subjective: Seen and examined and was a little confused this AM and did not really want to speak to me. No Abdominal Pain and kept redirecting questioning.   Objective: Vitals:   09/12/16 1300 09/12/16 1400 09/12/16 1500 09/12/16 1700  BP:      Pulse: 83 85 78   Resp: 16 (!) 21 14   Temp: 97.4 F (36.3 C)   98.1 F (36.7 C)  TempSrc: Oral   Oral  SpO2: 99% 100% 100%   Weight:      Height:        Intake/Output Summary (Last 24 hours) at 09/12/16 1843 Last data filed at 09/12/16 1500  Gross per 24 hour  Intake              426 ml  Output              475 ml  Net              -49 ml   Filed Weights   09/10/16 0439 09/11/16 0428 09/12/16 0600  Weight: 120.1 kg (264 lb 12.4 oz) 122.8 kg (270 lb 11.6 oz) 123.7 kg (272 lb 11.3 oz)    Examination: Physical Exam:  Constitutional: WN/WD, NAD but appeared agitated Eyes: Lids and conjunctivae normal, sclerae anicteric  ENMT: External Ears, Nose appear normal. Grossly normal hearing.  Neck: Appears normal, supple, no cervical masses, normal ROM, no appreciable thyromegaly,  JVD noted Respiratory: Diminished to Ausculatation bilaterally, no wheezing, rales, rhonchi or crackles.  Cardiovascular: RRR, no murmurs / rubs / gallops. S1 and S2 auscultated.  Abdomen: Soft, non-tender, non-distended. No masses palpated. No appreciable hepatosplenomegaly. Bowel sounds positive x4.  GU: Deferred. Musculoskeletal: No clubbing / cyanosis of digits/nails. No joint deformity upper and lower extremities. Wearing Una Boots and legs in ace bandages with edema Skin: No rashes, lesions, ulcers. No induration on limited skin eval; Warm and dry.  Neurologic: CN 2-12 grossly intact with no focal  deficits. Sensation intact in all 4 Extremities. Romberg sign cerebellar reflexes not assessed.  Psychiatric: Impaired judgment and insight. Alert and awake but agitated.   Data Reviewed: I have personally reviewed following labs and imaging studies  CBC:  Recent Labs Lab 09/09/16 0546 09/10/16 0820 09/10/16 1129 09/11/16 0329 09/12/16 0350  WBC 10.4 24.4* 23.7* 15.3* 13.3*  NEUTROABS  --   --   --   --  6.2  HGB 7.4* 7.7* 7.7* 7.1* 7.0*  HCT 24.1* 24.8* 25.1* 22.8* 23.0*  MCV 78.0 78.0 78.4 77.6* 77.7*  PLT 95* 96* 96* 95* [id number]*   Basic Metabolic Panel:  Recent Labs Lab 09/08/16 0532 09/09/16 0546 09/10/16 0820 09/11/16 0329 09/12/16 0350  NA 138 140 137 137 136  K 4.9 4.8 5.3* 5.5* 5.1  CL 110 108 105 106 104  CO2 21* 22 22 23 23   GLUCOSE 116* 128* 171* 123* 155*  BUN 56* 56* 58* 63* 65*  CREATININE 3.40* 3.20* 3.66* 3.69* 4.07*  CALCIUM 8.1* 8.1* 8.2* 8.0* 8.0*  MG  --   --   --   --  1.6*  PHOS  --   --   --   --  5.8*   GFR: Estimated Creatinine Clearance: 19.2 mL/min (by C-G formula based on SCr of 4.07 mg/dL (H)). Liver Function Tests:  Recent Labs Lab 09/12/16 0350  AST 7*  ALT 9*  ALKPHOS 51  BILITOT 0.7  PROT 6.0*  ALBUMIN 2.7*   No results for input(s): LIPASE, AMYLASE in the last 168 hours. No results for input(s): AMMONIA in the last 168 hours. Coagulation Profile: No results for input(s): INR, PROTIME in the last 168 hours. Cardiac Enzymes:  Recent Labs Lab 09/05/16 2211 09/06/16 0402 09/06/16 1052 09/09/16 1728 09/10/16 0820  TROPONINI 0.03* 0.04* 0.03* <0.03 <0.03   BNP (last 3 results) No results for input(s): PROBNP in the last 8760 hours. HbA1C: No results for input(s): HGBA1C in the last 72 hours. CBG:  Recent Labs Lab 09/11/16 1702 09/11/16 2126 09/12/16 0835 09/12/16 1243 09/12/16 1703  GLUCAP 178* 182* 162* 144* 137*   Lipid Profile: No results for input(s): CHOL, HDL, LDLCALC, TRIG, CHOLHDL, LDLDIRECT in the  last 72 hours. Thyroid Function Tests: No results for input(s): TSH, T4TOTAL, FREET4, T3FREE, THYROIDAB in the last 72 hours. Anemia Panel: No results for input(s): VITAMINB12, FOLATE, FERRITIN, TIBC, IRON, RETICCTPCT in the last 72 hours. Sepsis Labs:  Recent Labs Lab 09/10/16 1608  LATICACIDVEN 1.5    Recent Results (from the past 240 hour(s))  MRSA PCR Screening     Status: None   Collection Time: 09/05/16 10:29 PM  Result Value Ref Range Status   MRSA by PCR NEGATIVE NEGATIVE Final    Comment:        The GeneXpert MRSA Assay (FDA approved for NASAL specimens only), is one component of a comprehensive MRSA colonization surveillance program. It is not intended to diagnose MRSA infection nor to  guide or monitor treatment for MRSA infections.   Culture, blood (routine x 2)     Status: None (Preliminary result)   Collection Time: 09/10/16  1:30 PM  Result Value Ref Range Status   Specimen Description BLOOD RIGHT ARM  Final   Special Requests BOTTLES DRAWN AEROBIC AND ANAEROBIC 10CC  Final   Culture   Final    NO GROWTH 2 DAYS Performed at [facility], [place] [address] [place], [place] [address]    Report Status PENDING  Incomplete  Culture, blood (routine x 2)     Status: None (Preliminary result)   Collection Time: 09/10/16  3:14 PM  Result Value Ref Range Status   Specimen Description RIGHT ANTECUBITAL  Final   Special Requests BOTTLES DRAWN AEROBIC AND ANAEROBIC 10CC  Final   Culture   Final    NO GROWTH 2 DAYS Performed at [facility], [place] [address], [place], [place] [address]    Report Status PENDING  Incomplete  MRSA PCR Screening     Status: None   Collection Time: 09/10/16  6:00 PM  Result Value Ref Range Status   MRSA by PCR NEGATIVE NEGATIVE Final    Comment:        The GeneXpert MRSA Assay (FDA approved for NASAL specimens only), is one component of a comprehensive MRSA colonization surveillance program. It is not intended to diagnose  MRSA infection nor to guide or monitor treatment for MRSA infections.     Radiology Studies: No results found. Scheduled Meds: . apixaban  2.5 mg Oral BID  . atorvastatin  20 mg Oral q1800  . darbepoetin (ARANESP) injection - NON-DIALYSIS  150 mcg Subcutaneous Q Thu-1800  . furosemide  160 mg Intravenous Q6H  . hydrocerin  1 application Topical Once per day on Tue Fri  . insulin aspart  0-5 Units Subcutaneous QHS  . insulin aspart  0-9 Units Subcutaneous TID WC  . iopamidol  30 mL Oral Once  . ipratropium-albuterol  3 mL Nebulization TID  . nystatin  1 g Topical BID  . piperacillin-tazobactam (ZOSYN)  IV  2.25 g Intravenous Q8H  . polyethylene glycol  17 g Oral Daily  . sodium chloride flush  3 mL Intravenous Q12H  . vancomycin  1,500 mg Intravenous Q48H   Continuous Infusions:   LOS: 7 days   Kerney Elbe, DO [facility] Pager [phone]  If 7PM-7AM, please contact night-coverage [email or web address] Password [facility] 09/12/2016, 6:43 PM

## 2016-09-12 NOTE — Progress Notes (Signed)
Physical Therapy Treatment Patient Details Name: [name] MRN: [id number] DOB: 1933/10/21 Today's Date: 09/12/2016    History of Present Illness 83 y.o. male with PMH significant for chronic diastolic HF, CKD stage, 3, obesity, LE lymphedema, CAD, HTN, atrial fibrillation (on eliquis), HLD,  and diabetes and admitted for acute on chronic diastolic heart failure    PT Comments    Pt more lethargic today and stating, " I thought I was supposed to d/c this morning."  Pt agreeable to mobilize however requiring more assist today then previous visit for bed mobility and transfers.  Continue to recommend SNF upon d/c.   Follow Up Recommendations  SNF     Equipment Recommendations  None recommended by PT    Recommendations for Other Services       Precautions / Restrictions Precautions Precautions: Fall Precaution Comments: CHF, bil Unna boots, edema LUE Restrictions Other Position/Activity Restrictions: WBAT    Mobility  Bed Mobility Overal bed mobility: Needs Assistance Bed Mobility: Supine to Sit     Supine to sit: +2 for physical assistance;Total assist     General bed mobility comments: assist for upper and lower body required today, pt reports increased weakness  Transfers Overall transfer level: Needs assistance   Transfers: Sit to/from Stand Sit to Stand: Mod assist;+2 physical assistance;+2 safety/equipment;From elevated surface         General transfer comment: pt still mod assist to stand upright with sara plus, bed to recliner, RN assisted  Ambulation/Gait                 Stairs            Wheelchair Mobility    Modified Rankin (Stroke Patients Only)       Balance Overall balance assessment: Needs assistance Sitting-balance support: Bilateral upper extremity supported;Feet supported Sitting balance-Leahy Scale: Poor Sitting balance - Comments: requires UE support                            Cognition  Arousal/Alertness: Lethargic Behavior During Therapy: Flat affect Overall Cognitive Status: No family/caregiver present to determine baseline cognitive functioning                 General Comments: pt more lethargic today and difficulty follow commands    Exercises      General Comments        Pertinent Vitals/Pain Pain Assessment: Faces Faces Pain Scale: Hurts whole lot Pain Location: "everywhere" Pain Descriptors / Indicators: Sore Pain Intervention(s): Limited activity within patient's tolerance;Monitored during session;Repositioned  Maintained O2 [place], vitals WNL during session    Home Living                      Prior Function            PT Goals (current goals can now be found in the care plan section) Progress towards PT goals: Progressing toward goals    Frequency    Min 3X/week      PT Plan Current plan remains appropriate    Co-evaluation             End of Session   Activity Tolerance: Patient limited by lethargy;Patient limited by fatigue Patient left: in chair;with call bell/phone within reach;with chair alarm set;with nursing/sitter in room     Time: 1413-1444 PT Time Calculation (min) (ACUTE ONLY): 31 min  Charges:  $Therapeutic Activity: 8-22 mins  G Codes:      Eliberto Sole,KATHrine E Sep 22, 2016, 4:07 PM Carmelia Bake, PT, DPT 2016-09-22 Pager: [phone]

## 2016-09-12 NOTE — Progress Notes (Signed)
Patient refusing nebulized medication. He is adamant that he "don't need it and don't want it". RN aware. Order changed to prn per RT protocol.

## 2016-09-13 LAB — CBC WITH DIFFERENTIAL/PLATELET
Basophils Absolute: 0 10*3/uL (ref 0.0–0.1)
Basophils Relative: 0 %
EOS ABS: 0.1 10*3/uL (ref 0.0–0.7)
EOS PCT: 1 %
HCT: 23.7 % — ABNORMAL LOW (ref 39.0–52.0)
Hemoglobin: 7.4 g/dL — ABNORMAL LOW (ref 13.0–17.0)
LYMPHS ABS: 2.3 10*3/uL (ref 0.7–4.0)
Lymphocytes Relative: 23 %
MCH: 23.9 pg — AB (ref 26.0–34.0)
MCHC: 31.2 g/dL (ref 30.0–36.0)
MCV: 76.7 fL — AB (ref 78.0–100.0)
MONOS PCT: 23 %
Monocytes Absolute: 2.2 10*3/uL — ABNORMAL HIGH (ref 0.1–1.0)
Neutro Abs: 5.2 10*3/uL (ref 1.7–7.7)
Neutrophils Relative %: 53 %
PLATELETS: 140 10*3/uL — AB (ref 150–400)
RBC: 3.09 MIL/uL — ABNORMAL LOW (ref 4.22–5.81)
RDW: 17 % — AB (ref 11.5–15.5)
WBC: 9.8 10*3/uL (ref 4.0–10.5)

## 2016-09-13 LAB — RENAL FUNCTION PANEL
Albumin: 2.8 g/dL — ABNORMAL LOW (ref 3.5–5.0)
Anion gap: 9 (ref 5–15)
BUN: 67 mg/dL — AB (ref 6–20)
CHLORIDE: 105 mmol/L (ref 101–111)
CO2: 23 mmol/L (ref 22–32)
CREATININE: 3.97 mg/dL — AB (ref 0.61–1.24)
Calcium: 8.1 mg/dL — ABNORMAL LOW (ref 8.9–10.3)
GFR calc Af Amer: 15 mL/min — ABNORMAL LOW (ref >60)
GFR calc non Af Amer: 13 mL/min — ABNORMAL LOW (ref >60)
Glucose, Bld: 137 mg/dL — ABNORMAL HIGH (ref 65–99)
Phosphorus: 5.9 mg/dL — ABNORMAL HIGH (ref 2.5–4.6)
Potassium: 4.8 mmol/L (ref 3.5–5.1)
Sodium: 137 mmol/L (ref 135–145)

## 2016-09-13 LAB — GLUCOSE, CAPILLARY
Glucose-Capillary: 169 mg/dL — ABNORMAL HIGH (ref 65–99)
Glucose-Capillary: 166 mg/dL — ABNORMAL HIGH (ref 65–99)
Glucose-Capillary: 128 mg/dL — ABNORMAL HIGH (ref 65–99)

## 2016-09-13 LAB — MAGNESIUM: Magnesium: 1.5 mg/dL — ABNORMAL LOW (ref 1.7–2.4)

## 2016-09-13 MED ORDER — HYDRALAZINE HCL 20 MG/ML IJ SOLN
10.0000 mg | Freq: Four times a day (QID) | INTRAMUSCULAR | Status: DC | PRN
  Administered 2016-09-13 – 2016-09-15 (×5): 10 mg via INTRAVENOUS
  Filled 2016-09-13 (×6): qty 1

## 2016-09-13 NOTE — Progress Notes (Signed)
[facility] ROUNDING NOTE   Subjective:   Interval History: comfortable no complaints   Objective:  Vital signs in last 24 hours:  Temp:  [97.4 F (36.3 C)-99 F (37.2 C)] 98.1 F (36.7 C) (01/19 0800) Pulse Rate:  [43-85] 62 (01/19 1000) Resp:  [13-25] 16 (01/19 1000) BP: (122-176)/(39-81) 165/66 (01/19 1000) SpO2:  [85 %-100 %] 97 % (01/19 1000) Weight:  [119.5 kg (263 lb 7.2 oz)] 119.5 kg (263 lb 7.2 oz) (01/19 0132)  Weight change: -4.2 kg (-9 lb 4.1 oz) Filed Weights   09/11/16 0428 09/12/16 0600 09/13/16 0132  Weight: 122.8 kg (270 lb 11.6 oz) 123.7 kg (272 lb 11.3 oz) 119.5 kg (263 lb 7.2 oz)    Intake/Output: I/O last 3 completed shifts: In: 1118 [P.O.:660; Other:110; IV Piggyback:348] Out: [id number] [Urine:1825]   Intake/Output this shift:  Total I/O In: 186 [P.O.:120; IV Piggyback:66] Out: 300 [Urine:300]  CVS- RRR RS- CTA ABD- BS present soft non-distended EXT- no edema   Basic Metabolic Panel:  Recent Labs Lab 09/09/16 0546 09/10/16 0820 09/11/16 0329 09/12/16 0350 09/13/16 0339  NA 140 137 137 136 137  K 4.8 5.3* 5.5* 5.1 4.8  CL 108 105 106 104 105  CO2 22 22 23 23 23   GLUCOSE 128* 171* 123* 155* 137*  BUN 56* 58* 63* 65* 67*  CREATININE 3.20* 3.66* 3.69* 4.07* 3.97*  CALCIUM 8.1* 8.2* 8.0* 8.0* 8.1*  MG  --   --   --  1.6* 1.5*  PHOS  --   --   --  5.8* 5.9*    Liver Function Tests:  Recent Labs Lab 09/12/16 0350 09/13/16 0339  AST 7*  --   ALT 9*  --   ALKPHOS 51  --   BILITOT 0.7  --   PROT 6.0*  --   ALBUMIN 2.7* 2.8*   No results for input(s): LIPASE, AMYLASE in the last 168 hours. No results for input(s): AMMONIA in the last 168 hours.  CBC:  Recent Labs Lab 09/10/16 0820 09/10/16 1129 09/11/16 0329 09/12/16 0350 09/13/16 0339  WBC 24.4* 23.7* 15.3* 13.3* 9.8  NEUTROABS  --   --   --  6.2 5.2  HGB 7.7* 7.7* 7.1* 7.0* 7.4*  HCT 24.8* 25.1* 22.8* 23.0* 23.7*  MCV 78.0 78.4 77.6* 77.7* 76.7*  PLT  96* 96* 95* 106* 140*    Cardiac Enzymes:  Recent Labs Lab 09/09/16 1728 09/10/16 0820  TROPONINI <0.03 <0.03    BNP: Invalid input(s): POCBNP  CBG:  Recent Labs Lab 09/12/16 1243 09/12/16 1703 09/12/16 2113 09/13/16 0737 09/13/16 1154  GLUCAP 144* 137* 136* 169* 166*    Microbiology: Results for orders placed or performed during the hospital encounter of 09/05/16  MRSA PCR Screening     Status: None   Collection Time: 09/05/16 10:29 PM  Result Value Ref Range Status   MRSA by PCR NEGATIVE NEGATIVE Final    Comment:        The GeneXpert MRSA Assay (FDA approved for NASAL specimens only), is one component of a comprehensive MRSA colonization surveillance program. It is not intended to diagnose MRSA infection nor to guide or monitor treatment for MRSA infections.   Culture, blood (routine x 2)     Status: None (Preliminary result)   Collection Time: 09/10/16  1:30 PM  Result Value Ref Range Status   Specimen Description BLOOD RIGHT ARM  Final   Special Requests BOTTLES DRAWN AEROBIC AND ANAEROBIC 10CC  Final  Culture   Final    NO GROWTH 2 DAYS Performed at [facility], [place] [address] [place], [place] [address]    Report Status PENDING  Incomplete  Culture, blood (routine x 2)     Status: None (Preliminary result)   Collection Time: 09/10/16  3:14 PM  Result Value Ref Range Status   Specimen Description RIGHT ANTECUBITAL  Final   Special Requests BOTTLES DRAWN AEROBIC AND ANAEROBIC 10CC  Final   Culture   Final    NO GROWTH 2 DAYS Performed at [facility], [place] [address], [place], [place] [address]    Report Status PENDING  Incomplete  MRSA PCR Screening     Status: None   Collection Time: 09/10/16  6:00 PM  Result Value Ref Range Status   MRSA by PCR NEGATIVE NEGATIVE Final    Comment:        The GeneXpert MRSA Assay (FDA approved for NASAL specimens only), is one component of a comprehensive MRSA colonization surveillance  program. It is not intended to diagnose MRSA infection nor to guide or monitor treatment for MRSA infections.     Coagulation Studies: No results for input(s): LABPROT, INR in the last 72 hours.  Urinalysis:  Recent Labs  09/12/16 1323  COLORURINE YELLOW  LABSPEC 1.011  PHURINE 5.0  GLUCOSEU NEGATIVE  HGBUR MODERATE*  BILIRUBINUR NEGATIVE  KETONESUR NEGATIVE  PROTEINUR NEGATIVE  NITRITE NEGATIVE  LEUKOCYTESUR NEGATIVE      Imaging: No results found.   Medications:    . apixaban  2.5 mg Oral BID  . atorvastatin  20 mg Oral q1800  . darbepoetin (ARANESP) injection - NON-DIALYSIS  150 mcg Subcutaneous Q Thu-1800  . furosemide  160 mg Intravenous Q6H  . hydrocerin  1 application Topical Once per day on Tue Fri  . insulin aspart  0-5 Units Subcutaneous QHS  . insulin aspart  0-9 Units Subcutaneous TID WC  . iopamidol  30 mL Oral Once  . nystatin  1 g Topical BID  . polyethylene glycol  17 g Oral Daily  . sodium chloride flush  3 mL Intravenous Q12H   sodium chloride, acetaminophen, bisacodyl, gi cocktail, HYDROcodone-acetaminophen, ipratropium-albuterol, ondansetron (ZOFRAN) IV, sodium chloride flush  Assessment/ Plan:  Pt is a 83 y.o. yo male who was admitted on 09/05/2016 with volume overload, possible BOO now with foley as well as A on CRF- baseline creatinine 2.5 Assessment/Plan: 1. AKI- initially pt diuresed with good results and creatinine trended down.  Then around 1/15-1/16 UOP dropped off and creatinine started to go up again. Ultrasound did not show obstruction.  This was coincident with lower BPs.  DDX is ATN or less likely acute GN with TNTC RBCs in urine (although maybe due to foley). Creatinine appears to be trending down with improved urine output  2. CKD- at baseline with creatinine of 2.5  3. Anemia- hgb in the 7's. On vanc /zosyn due to sepsis type picture- no iron but will give ESA 4. HTN/volume- overloaded but with soft BP- diurese as able- stop  all antihypertensives including flomax   LOS: 8 [name] W @TODAY @12 :56 PM

## 2016-09-13 NOTE — Progress Notes (Signed)
PROGRESS NOTE    Claborn Krolczyk  [id number] DOB: 1934/08/12 DOA: 09/05/2016 PCP: [name], PA   Brief Narrative:  [name] Jeffersonis a 83 y.o.malewith PMH significant for chronic diastolic HF, CKD stage, 3, obesity, LE lymphedema, CAD, HTN, atrial fibrillation (on eliquis), HLD and diabetes with nephropathy; who presented to ED from his SNF due to SOB, worsening LE edema and inability to urinate. Patient endorses been unable to pee despite lasix for the last 14 hours or so. Also has experienced SOB and orthopnea for the last 2-3 days, along with worsening on his bilateral LE edema (now unable to pick his left up on his own or to walk properly due to swelling). He denies CP, Fever, HA's, blurred vision, abd pain, dysuria, hematuria, melena, hematochezia or any other complaints. Currently being treated for Heart Failure Exacerbation with Anasarca. Cardiology consulted and signed off. Nephrology are consulted and following. Still being diuresed and remains extremely volume overloaded.   Assessment & Plan:   Principal Problem:   Acute on chronic diastolic congestive heart failure (HCC) Active Problems:   Lymphedema   Acute renal failure superimposed on stage 3 chronic kidney disease (HCC)   Chronic diastolic CHF (congestive heart failure) (HCC)   Anemia of chronic renal failure, stage 3 (moderate)   Uncontrolled diabetes mellitus with diabetic nephropathy, with long-term current use of insulin (HCC)   PAF (paroxysmal atrial fibrillation) (HCC)   Atrial flutter (HCC)   Bradycardia   AKI (acute kidney injury) ([facility])   HTN (hypertension)   Severe obesity (BMI >= 40) (HCC)   Mobitz type 1 second degree AV block   Pericardial effusion- moderate on CT scan   Leukocytosis  Acute on Chronic diastolic heart failure -previous echocardiogram noted grade 2 diastolic dysfunction (Q000111Q) and systolic dysfunction (0000000) -Echocardiogram showed EF 55-60%, left ventricular diastolic function  parameters are normal, left atrium was moderately to severely dilated -Cardiology Repeated ECHO; Showed IVC is Dilated possibly to Volume Overload -Not sure if patient's SOB and LE edema is due to CHF exacerbation vs undiagnosed sleep apnea? -BNP on admission 557 -Chest x-ray showed small left pleural effusion with primary congestion -Continue IV Lasix, Currently getting 160 mg IV q6h at the recommendations of Nephrology -Monitor intake and output, daily weights -Currently no ACEi or ARB due to renal function -Appreciated Cardiology Recc's -Continue to monitor Volume Status  Acute renal failure superimposed on CKD, stage IV -due to poor perfusion from CHF exacerbation and likely from decreased BP plus component of urinary retention most likely from BPH -Creatinine upon admission 3.67, baseline 2.6-2.9 -Creatinine today slightly improved to 3.97 from 4.07 -Renal US showed no hydronephrosis -Continue to Follow follow intake/output -D/C'd IV Vanc and Zosyn as serum Cr was worsening  -Nephrology Following and patient being Diuresed with 160 mg of IV Lasix q6h -Nephrology does not feel like dialysis is indicated at this point  -Continue to Monitor Renal Function in AM  Pericardial Effusion -Cardiology Followed and appreciated Recc's -Repeat ECHO showed it was small and no RV Diastolic Collapse;  -Cardiology does not feel it is contributing to present clinical picture -Continue to Monitor  Leukocytosis, improved -Improved; WBC down to 9.8 -UA and CXR were unremarkable for infection -repeat UA Negative, CXR showed CHF -Blood cultures x2 showed NGTD at 2 days -Currently afebrile -Was on IV Vanc and IV Zosyn but Discontinued now as not showing Signs/Symptoms of infection -Abd xray showed mild prominent air-filled loops of small and large bowel, mild adynamic ileus cannot be excluded.  -  CT of Chest showed Moderate Pericardial Effusion with ?pericarditis; Volume Overload with Marked  Anasarca, Moderate B/L Pleural Effusions and Small Ascites Noted. Multiple Segment Atelectacasis noded -Continue to Monitor  Hyperkalemia -Resolved; K+ is now 4.8 -Should Corrrect with Diuresis  -Repeat BMP in AM  Anemia of chronic renal failure, stage 3  -Hgb 7.4 -no signs of acute bleeding -will monitor trend and if Hgb less than 7 will need transfusion  -FOBT x3 pending  -Nephrology started Aranesep 150 mcg sq every Thursday -Continue to monitor CBC (may improve with diuresis)  Diabetes mellitus, type II with nephropathy  -Continue insulin size, CBG monitoring; Last CBG's have ranged from 137-162 -Lantus and glipizide held -Hemoglobin A1c 6.4  Paroxysmal Atrial fibrillation with Conversion to Mobitz Second-Degree AV block -Per Cardiology would Avoid BB and AV Nodal Blocking Agents  -Per Cardiology no indication of Pacemaker at this point  -Continue eliquis 2.5 mg po BID -CHADSVASC at least 4 (given age, h/o dCHF, DM, HTN) -Cardiology Followed and appreciated Recc's  Essential Hypertension -All BP Meds held including Hydralazine, Coreg, Imdur -IV Lasix increased by Nephrology  CAD/Elevated troponin -no CP; but SOB could be equivalent to Angina -troponin peaked at 0.04, Currently flat and less than <0.03; unlikely ACS.  -Possibly secondary to demand. -Echocardiogram as listed above -Continue coreg, lipitor and Imdur, apixaban -Cardiology Following  Hyperlipidemia -Continue Statin  Acute urinary retention -as mentioned above; foley catheter placed while in ED -Flomax discontinued by Nephrology  Severe obesity  -Body mass index is 38.67 kg/m. -Will need to follow up with PCP upon discharge to discuss lifestyle modifications -Nutrition consulted -patient should have a sleep study as an outpateint  Deconditioning -Not sure where patient is from.  H&P noted SNF, social work/case management noted [facility].  -PT and OT consulted, recommended SNF once stable for  D/C  Lower extremity edema -Unna boots in place, wound care consulted -Diuresis as above  Constipation -Improved -Continue bowel regimen  DVT prophylaxis: Anticoagulated with Apixaban Code Status: FULL CODE Family Communication: No Family present at bedside Disposition Plan: Remain in SDU and transfer to [facility] if stable in AM  Consultants:   Nephrology  Cardiology  Procedures: Echocardiogram; Renal Ultrasound  Antimicrobials: IV Vancomycin and IV Zosyn D/C'd  Subjective: Seen and examined and was still a little irritated. No CP or SOB. States he has not had a bowel movement today. Still swollen. No other concerns or complaints at this time.   Objective: Vitals:   09/13/16 1000 09/13/16 1200 09/13/16 1300 09/13/16 1400  BP: (!) 165/66 (!) 151/59 (!) 142/53 (!) 149/44  Pulse: 62 60 60 (!) 59  Resp: 16 (!) 21 (!) 21 (!) 24  Temp:      TempSrc:      SpO2: 97% 95% 99% 94%  Weight:      Height:        Intake/Output Summary (Last 24 hours) at 09/13/16 1610 Last data filed at 09/13/16 1416  Gross per 24 hour  Intake              944 ml  Output             2150 ml  Net            -1206 ml   Filed Weights   09/11/16 0428 09/12/16 0600 09/13/16 0132  Weight: 122.8 kg (270 lb 11.6 oz) 123.7 kg (272 lb 11.3 oz) 119.5 kg (263 lb 7.2 oz)   Examination: Physical Exam:  Constitutional: WN/WD,  NAD appears calmer today Eyes: Lids and conjunctivae normal, sclerae anicteric  ENMT: External Ears, Nose appear normal. Grossly normal hearing.  Neck: Appears normal, supple, no cervical masses, normal ROM, no appreciable thyromegaly, some JVD noted Respiratory: Diminished to Ausculatation bilaterally, no wheezing, rales, rhonchi or crackles.  Cardiovascular: RRR, no murmurs / rubs / gallops. S1 and S2 auscultated.  Abdomen: Soft, non-tender, non-distended. No masses palpated. No appreciable hepatosplenomegaly. Bowel sounds positive x4.  GU: Deferred. Musculoskeletal: No  clubbing / cyanosis of digits/nails. No joint deformity upper and lower extremities. Wearing Una Boots and legs in ace bandages with some edema Skin: No rashes, lesions, ulcers. No induration on limited skin eval; Warm and dry.  Neurologic: CN 2-12 grossly intact with no focal deficits. Sensation intact in all 4 Extremities. Romberg sign cerebellar reflexes not assessed.  Psychiatric: Impaired judgment and insight. Alert and awake. Not as agitated today  Data Reviewed: I have personally reviewed following labs and imaging studies  CBC:  Recent Labs Lab 09/10/16 0820 09/10/16 1129 09/11/16 0329 09/12/16 0350 09/13/16 0339  WBC 24.4* 23.7* 15.3* 13.3* 9.8  NEUTROABS  --   --   --  6.2 5.2  HGB 7.7* 7.7* 7.1* 7.0* 7.4*  HCT 24.8* 25.1* 22.8* 23.0* 23.7*  MCV 78.0 78.4 77.6* 77.7* 76.7*  PLT 96* 96* 95* 106* [id number]*   Basic Metabolic Panel:  Recent Labs Lab 09/09/16 0546 09/10/16 0820 09/11/16 0329 09/12/16 0350 09/13/16 0339  NA 140 137 137 136 137  K 4.8 5.3* 5.5* 5.1 4.8  CL 108 105 106 104 105  CO2 22 22 23 23 23   GLUCOSE 128* 171* 123* 155* 137*  BUN 56* 58* 63* 65* 67*  CREATININE 3.20* 3.66* 3.69* 4.07* 3.97*  CALCIUM 8.1* 8.2* 8.0* 8.0* 8.1*  MG  --   --   --  1.6* 1.5*  PHOS  --   --   --  5.8* 5.9*   GFR: Estimated Creatinine Clearance: 19.4 mL/min (by C-G formula based on SCr of 3.97 mg/dL (H)). Liver Function Tests:  Recent Labs Lab 09/12/16 0350 09/13/16 0339  AST 7*  --   ALT 9*  --   ALKPHOS 51  --   BILITOT 0.7  --   PROT 6.0*  --   ALBUMIN 2.7* 2.8*   No results for input(s): LIPASE, AMYLASE in the last 168 hours. No results for input(s): AMMONIA in the last 168 hours. Coagulation Profile: No results for input(s): INR, PROTIME in the last 168 hours. Cardiac Enzymes:  Recent Labs Lab 09/09/16 1728 09/10/16 0820  TROPONINI <0.03 <0.03   BNP (last 3 results) No results for input(s): PROBNP in the last 8760 hours. HbA1C: No results for  input(s): HGBA1C in the last 72 hours. CBG:  Recent Labs Lab 09/12/16 1243 09/12/16 1703 09/12/16 2113 09/13/16 0737 09/13/16 1154  GLUCAP 144* 137* 136* 169* 166*   Lipid Profile: No results for input(s): CHOL, HDL, LDLCALC, TRIG, CHOLHDL, LDLDIRECT in the last 72 hours. Thyroid Function Tests: No results for input(s): TSH, T4TOTAL, FREET4, T3FREE, THYROIDAB in the last 72 hours. Anemia Panel: No results for input(s): VITAMINB12, FOLATE, FERRITIN, TIBC, IRON, RETICCTPCT in the last 72 hours. Sepsis Labs:  Recent Labs Lab 09/10/16 1608  LATICACIDVEN 1.5    Recent Results (from the past 240 hour(s))  MRSA PCR Screening     Status: None   Collection Time: 09/05/16 10:29 PM  Result Value Ref Range Status   MRSA by PCR NEGATIVE NEGATIVE Final  Comment:        The GeneXpert MRSA Assay (FDA approved for NASAL specimens only), is one component of a comprehensive MRSA colonization surveillance program. It is not intended to diagnose MRSA infection nor to guide or monitor treatment for MRSA infections.   Culture, blood (routine x 2)     Status: None (Preliminary result)   Collection Time: 09/10/16  1:30 PM  Result Value Ref Range Status   Specimen Description BLOOD RIGHT ARM  Final   Special Requests BOTTLES DRAWN AEROBIC AND ANAEROBIC 10CC  Final   Culture   Final    NO GROWTH 3 DAYS Performed at [facility], [place] [address], [place], [place] [address]    Report Status PENDING  Incomplete  Culture, blood (routine x 2)     Status: None (Preliminary result)   Collection Time: 09/10/16  3:14 PM  Result Value Ref Range Status   Specimen Description RIGHT ANTECUBITAL  Final   Special Requests BOTTLES DRAWN AEROBIC AND ANAEROBIC 10CC  Final   Culture   Final    NO GROWTH 3 DAYS Performed at [facility], [place] [address], [place], [place] [address]    Report Status PENDING  Incomplete  MRSA PCR Screening     Status: None   Collection Time: 09/10/16  6:00  PM  Result Value Ref Range Status   MRSA by PCR NEGATIVE NEGATIVE Final    Comment:        The GeneXpert MRSA Assay (FDA approved for NASAL specimens only), is one component of a comprehensive MRSA colonization surveillance program. It is not intended to diagnose MRSA infection nor to guide or monitor treatment for MRSA infections.     Radiology Studies: No results found. Scheduled Meds: . apixaban  2.5 mg Oral BID  . atorvastatin  20 mg Oral q1800  . darbepoetin (ARANESP) injection - NON-DIALYSIS  150 mcg Subcutaneous Q Thu-1800  . furosemide  160 mg Intravenous Q6H  . hydrocerin  1 application Topical Once per day on Tue Fri  . insulin aspart  0-5 Units Subcutaneous QHS  . insulin aspart  0-9 Units Subcutaneous TID WC  . iopamidol  30 mL Oral Once  . nystatin  1 g Topical BID  . polyethylene glycol  17 g Oral Daily  . sodium chloride flush  3 mL Intravenous Q12H   Continuous Infusions:   LOS: 8 days   Kerney Elbe, DO [facility] Pager [phone]  If 7PM-7AM, please contact night-coverage [email or web address] Password [email or web address] 09/13/2016, 4:10 PM

## 2016-09-14 LAB — CBC WITH DIFFERENTIAL/PLATELET
BASOS ABS: 0 10*3/uL (ref 0.0–0.1)
BASOS PCT: 0 %
EOS ABS: 0.1 10*3/uL (ref 0.0–0.7)
Eosinophils Relative: 1 %
HCT: 24.6 % — ABNORMAL LOW (ref 39.0–52.0)
HEMOGLOBIN: 7.5 g/dL — AB (ref 13.0–17.0)
Lymphocytes Relative: 16 %
Lymphs Abs: 1.3 10*3/uL (ref 0.7–4.0)
MCH: 23.4 pg — ABNORMAL LOW (ref 26.0–34.0)
MCHC: 30.5 g/dL (ref 30.0–36.0)
MCV: 76.9 fL — ABNORMAL LOW (ref 78.0–100.0)
Monocytes Absolute: 2.8 10*3/uL — ABNORMAL HIGH (ref 0.1–1.0)
Monocytes Relative: 36 %
NEUTROS PCT: 47 %
Neutro Abs: 3.6 10*3/uL (ref 1.7–7.7)
Platelets: 179 10*3/uL (ref 150–400)
RBC: 3.2 MIL/uL — ABNORMAL LOW (ref 4.22–5.81)
RDW: 16.8 % — AB (ref 11.5–15.5)
WBC: 7.7 10*3/uL (ref 4.0–10.5)

## 2016-09-14 LAB — COMPREHENSIVE METABOLIC PANEL
ALBUMIN: 2.7 g/dL — AB (ref 3.5–5.0)
ALT: 12 U/L — ABNORMAL LOW (ref 17–63)
ANION GAP: 10 (ref 5–15)
AST: 10 U/L — AB (ref 15–41)
Alkaline Phosphatase: 55 U/L (ref 38–126)
BUN: 63 mg/dL — ABNORMAL HIGH (ref 6–20)
CO2: 23 mmol/L (ref 22–32)
Calcium: 8.1 mg/dL — ABNORMAL LOW (ref 8.9–10.3)
Chloride: 104 mmol/L (ref 101–111)
Creatinine, Ser: 3.96 mg/dL — ABNORMAL HIGH (ref 0.61–1.24)
GFR calc Af Amer: 15 mL/min — ABNORMAL LOW (ref >60)
GFR calc non Af Amer: 13 mL/min — ABNORMAL LOW (ref >60)
GLUCOSE: 122 mg/dL — AB (ref 65–99)
POTASSIUM: 4.8 mmol/L (ref 3.5–5.1)
SODIUM: 137 mmol/L (ref 135–145)
Total Bilirubin: 0.7 mg/dL (ref 0.3–1.2)
Total Protein: 6.2 g/dL — ABNORMAL LOW (ref 6.5–8.1)

## 2016-09-14 LAB — GLUCOSE, CAPILLARY
GLUCOSE-CAPILLARY: 123 mg/dL — AB (ref 65–99)
GLUCOSE-CAPILLARY: 143 mg/dL — AB (ref 65–99)
GLUCOSE-CAPILLARY: 163 mg/dL — AB (ref 65–99)
GLUCOSE-CAPILLARY: 171 mg/dL — AB (ref 65–99)
Glucose-Capillary: 211 mg/dL — ABNORMAL HIGH (ref 65–99)

## 2016-09-14 LAB — PHOSPHORUS: Phosphorus: 5.7 mg/dL — ABNORMAL HIGH (ref 2.5–4.6)

## 2016-09-14 LAB — MAGNESIUM: Magnesium: 1.5 mg/dL — ABNORMAL LOW (ref 1.7–2.4)

## 2016-09-14 NOTE — Progress Notes (Signed)
PROGRESS NOTE    [name]  [id number] DOB: [birth year] DOA: 09/05/2016 PCP: [name], PA   Brief Narrative:  [name] Jeffersonis a 83 y.o.malewith PMH significant for chronic diastolic HF, CKD stage, 3, obesity, LE lymphedema, CAD, HTN, atrial fibrillation (on eliquis), HLD and diabetes with nephropathy; who presented to ED from his SNF due to SOB, worsening LE edema and inability to urinate. Patient endorses been unable to pee despite lasix for the last 14 hours or so. Also has experienced SOB and orthopnea for the last 2-3 days, along with worsening on his bilateral LE edema (now unable to pick his left up on his own or to walk properly due to swelling). He denies CP, Fever, HA's, blurred vision, abd pain, dysuria, hematuria, melena, hematochezia or any other complaints. Currently being treated for Heart Failure Exacerbation with Anasarca. Cardiology consulted and signed off. Nephrology are consulted and following. Still being diuresed and improving slowly.   Assessment & Plan:   Principal Problem:   Acute on chronic diastolic congestive heart failure (HCC) Active Problems:   Lymphedema   Acute renal failure superimposed on stage 3 chronic kidney disease (HCC)   Chronic diastolic CHF (congestive heart failure) (HCC)   Anemia of chronic renal failure, stage 3 (moderate)   Uncontrolled diabetes mellitus with diabetic nephropathy, with long-term current use of insulin (HCC)   PAF (paroxysmal atrial fibrillation) (HCC)   Atrial flutter (HCC)   Bradycardia   AKI (acute kidney injury) ([place])   HTN (hypertension)   Severe obesity (BMI >= 40) (HCC)   Mobitz type 1 second degree AV block   Pericardial effusion- moderate on CT scan   Leukocytosis  Acute on Chronic diastolic heart failure -previous echocardiogram noted grade 2 diastolic dysfunction (Q000111Q) and systolic dysfunction (0000000) -Echocardiogram showed EF 55-60%, left ventricular diastolic function parameters are normal,  left atrium was moderately to severely dilated -Cardiology Repeated ECHO; Showed IVC is Dilated possibly to Volume Overload -Not sure if patient's SOB and LE edema is due to CHF exacerbation vs undiagnosed sleep apnea? -BNP on admission 557 -Chest x-ray showed small left pleural effusion with primary congestion -Continue IV Lasix, Currently getting 160 mg IV q6h at the recommendations of Nephrology -Monitor intake and output, daily weights -Currently no ACEi or ARB due to renal function -Appreciated Cardiology Recc's -Continue to monitor Volume Status  Acute renal failure superimposed on CKD, stage IV -due to poor perfusion from CHF exacerbation and likely from decreased BP plus component of urinary retention most likely from BPH -Creatinine upon admission 3.67, baseline 2.6-2.9 -Creatinine essentially the same today at 3.96  -Renal US showed no hydronephrosis -Continue to Follow follow intake/output -D/C'd IV Vanc and Zosyn as serum Cr was worsening  -Nephrology Following and patient being Diuresed with 160 mg of IV Lasix q6h; Discussed with Dr. Justin Mend about Lasix gtt and he feels not as efficacious  -Nephrology does not feel like dialysis is indicated at this point  -Continue to Monitor Renal Function in AM  Pericardial Effusion -Cardiology Followed and appreciated Recc's -Repeat ECHO showed it was small and no RV Diastolic Collapse;  -Cardiology does not feel it is contributing to present clinical picture -Continue to Monitor  Leukocytosis, improved -Improved; WBC down to 7.7 -UA and CXR were unremarkable for infection -repeat UA Negative, CXR showed CHF -Blood cultures x2 showed NGTD at 4 days -Currently afebrile -Was on IV Vanc and IV Zosyn but Discontinued now as not showing Signs/Symptoms of infection -Abd xray showed mild prominent air-filled  loops of small and large bowel, mild adynamic ileus cannot be excluded.  -CT of Chest showed Moderate Pericardial Effusion with  ?pericarditis; Volume Overload with Marked Anasarca, Moderate B/L Pleural Effusions and Small Ascites Noted. Multiple Segment Atelectacasis noded -Continue to Monitor  Hyperkalemia -Resolved; K+ is now 4.8 -Should Corrrect with Diuresis  -Repeat BMP in AM  Anemia of chronic renal failure, stage 3  -Hgb 7.5 -no signs of acute bleeding -will monitor trend and if Hgb less than 7 will need transfusion  -FOBT x3 pending  -Nephrology started Aranesep 150 mcg sq every Thursday -Continue to monitor CBC (may improve with diuresis)  Diabetes mellitus, type II with nephropathy  -Continue insulin size, CBG monitoring; Last CBG's have ranged from 137-162 -Lantus and glipizide held -Hemoglobin A1c 6.4  Paroxysmal Atrial fibrillation now A Flutter with 3:1 [place]  -Per Cardiology would Avoid BB and AV Nodal Blocking Agents  -Per Cardiology no indication of Pacemaker at this point  -Continue eliquis 2.5 mg po BID -CHADSVASC at least 4 (given age, h/o dCHF, DM, HTN) -Cardiology Followed and appreciated Recc's  Essential Hypertension -Added PRN HYDRALAZINE; May need to add home Hydralazine back  -All BP Meds held including Hydralazine, Coreg, Imdur -IV Lasix increased by Nephrology  CAD/Elevated troponin -no CP; but SOB could be equivalent to Angina -troponin peaked at 0.04, Currently flat and less than <0.03; unlikely ACS.  -Possibly secondary to demand. -Echocardiogram as listed above -Continue coreg, lipitor and Imdur, apixaban -Cardiology Following  Hyperlipidemia -Continue Statin  Acute urinary retention -as mentioned above; foley catheter placed while in ED -Flomax discontinued by Nephrology  Severe obesity  -Body mass index is 38.67 kg/m. -Will need to follow up with PCP upon discharge to discuss lifestyle modifications -Nutrition consulted -patient should have a sleep study as an outpateint  Deconditioning -Not sure where patient is from.  H&P noted SNF, social  work/case management noted Hotel.  -PT and OT consulted, recommended SNF once stable for D/C  Lower extremity edema -Unna boots in place, wound care consulted -Diuresis as above  Constipation -Improved -Continue bowel regimen  DVT prophylaxis: Anticoagulated with Apixaban Code Status: FULL CODE Family Communication: No Family present at bedside Disposition Plan: Remain in SDU and transfer to [facility] if stable in AM  Consultants:   Nephrology  Cardiology  Procedures: Echocardiogram; Renal Ultrasound  Antimicrobials: IV Vancomycin and IV Zosyn D/C'd  Subjective: Seen and examined and had no complaints. Thinks swelling in legs is improving. No nausea or Vomiting.    Objective: Vitals:   09/14/16 0500 09/14/16 0600 09/14/16 0700 09/14/16 0800  BP: (!) 167/58 (!) 185/55 (!) 147/45 (!) 172/45  Pulse: 64 62 63 63  Resp: 14 15 17 20   Temp:      TempSrc:      SpO2: 99% 99% 98% 96%  Weight:      Height:        Intake/Output Summary (Last 24 hours) at 09/14/16 0916 Last data filed at 09/14/16 0824  Gross per 24 hour  Intake              718 ml  Output             3280 ml  Net            -2562 ml   Filed Weights   09/12/16 0600 09/13/16 0132 09/14/16 0200  Weight: 123.7 kg (272 lb 11.3 oz) 119.5 kg (263 lb 7.2 oz) 119.5 kg (263 lb 7.2 oz)  Examination: Physical Exam:  Constitutional: WN/WD, NAD appears calm Eyes: Lids and conjunctivae normal, sclerae anicteric  ENMT: External Ears, Nose appear normal. Grossly normal hearing.  Neck: Appears normal, supple, no cervical masses, normal ROM, no appreciable thyromegaly Respiratory: Diminished to Ausculatation bilaterally, no wheezing, rales, rhonchi or crackles.  Cardiovascular: RRR, no murmurs / rubs / gallops. S1 and S2 auscultated.  Abdomen: Soft, non-tender, non-distended. No masses palpated. No appreciable hepatosplenomegaly. Bowel sounds positive x4.  GU: Deferred. Musculoskeletal: No clubbing / cyanosis of  digits/nails. No joint deformity upper and lower extremities. Wearing Una Boots and legs in ace bandages with less edema Skin: No rashes, lesions, ulcers. No induration on limited skin eval; Warm and dry.  Neurologic: CN 2-12 grossly intact with no focal deficits. Sensation intact in all 4 Extremities. Romberg sign cerebellar reflexes not assessed.  Psychiatric: Impaired judgment and insight. Alert and awake. Not as agitated today  Data Reviewed: I have personally reviewed following labs and imaging studies  CBC:  Recent Labs Lab 09/10/16 1129 09/11/16 0329 09/12/16 0350 09/13/16 0339 09/14/16 0325  WBC 23.7* 15.3* 13.3* 9.8 7.7  NEUTROABS  --   --  6.2 5.2 3.6  HGB 7.7* 7.1* 7.0* 7.4* 7.5*  HCT 25.1* 22.8* 23.0* 23.7* 24.6*  MCV 78.4 77.6* 77.7* 76.7* 76.9*  PLT 96* 95* 106* 140* 0000000   Basic Metabolic Panel:  Recent Labs Lab 09/10/16 0820 09/11/16 0329 09/12/16 0350 09/13/16 0339 09/14/16 0325  NA 137 137 136 137 137  K 5.3* 5.5* 5.1 4.8 4.8  CL 105 106 104 105 104  CO2 22 23 23 23 23   GLUCOSE 171* 123* 155* 137* 122*  BUN 58* 63* 65* 67* 63*  CREATININE 3.66* 3.69* 4.07* 3.97* 3.96*  CALCIUM 8.2* 8.0* 8.0* 8.1* 8.1*  MG  --   --  1.6* 1.5* 1.5*  PHOS  --   --  5.8* 5.9* 5.7*   GFR: Estimated Creatinine Clearance: 19.4 mL/min (by C-G formula based on SCr of 3.96 mg/dL (H)). Liver Function Tests:  Recent Labs Lab 09/12/16 0350 09/13/16 0339 09/14/16 0325  AST 7*  --  10*  ALT 9*  --  12*  ALKPHOS 51  --  55  BILITOT 0.7  --  0.7  PROT 6.0*  --  6.2*  ALBUMIN 2.7* 2.8* 2.7*   No results for input(s): LIPASE, AMYLASE in the last 168 hours. No results for input(s): AMMONIA in the last 168 hours. Coagulation Profile: No results for input(s): INR, PROTIME in the last 168 hours. Cardiac Enzymes:  Recent Labs Lab 09/09/16 1728 09/10/16 0820  TROPONINI <0.03 <0.03   BNP (last 3 results) No results for input(s): PROBNP in the last 8760 hours. HbA1C: No  results for input(s): HGBA1C in the last 72 hours. CBG:  Recent Labs Lab 09/12/16 2113 09/13/16 0737 09/13/16 1154 09/13/16 2200 09/14/16 0756  GLUCAP 136* 169* 166* 128* 143*   Lipid Profile: No results for input(s): CHOL, HDL, LDLCALC, TRIG, CHOLHDL, LDLDIRECT in the last 72 hours. Thyroid Function Tests: No results for input(s): TSH, T4TOTAL, FREET4, T3FREE, THYROIDAB in the last 72 hours. Anemia Panel: No results for input(s): VITAMINB12, FOLATE, FERRITIN, TIBC, IRON, RETICCTPCT in the last 72 hours. Sepsis Labs:  Recent Labs Lab 09/10/16 1608  LATICACIDVEN 1.5    Recent Results (from the past 240 hour(s))  MRSA PCR Screening     Status: None   Collection Time: 09/05/16 10:29 PM  Result Value Ref Range Status   MRSA by PCR  NEGATIVE NEGATIVE Final    Comment:        The GeneXpert MRSA Assay (FDA approved for NASAL specimens only), is one component of a comprehensive MRSA colonization surveillance program. It is not intended to diagnose MRSA infection nor to guide or monitor treatment for MRSA infections.   Culture, blood (routine x 2)     Status: None (Preliminary result)   Collection Time: 09/10/16  1:30 PM  Result Value Ref Range Status   Specimen Description BLOOD RIGHT ARM  Final   Special Requests BOTTLES DRAWN AEROBIC AND ANAEROBIC 10CC  Final   Culture   Final    NO GROWTH 3 DAYS Performed at [facility], [place] [address], [place], [place] [address]    Report Status PENDING  Incomplete  Culture, blood (routine x 2)     Status: None (Preliminary result)   Collection Time: 09/10/16  3:14 PM  Result Value Ref Range Status   Specimen Description RIGHT ANTECUBITAL  Final   Special Requests BOTTLES DRAWN AEROBIC AND ANAEROBIC 10CC  Final   Culture   Final    NO GROWTH 3 DAYS Performed at [facility], [place] [address] [place], [place] [address]    Report Status PENDING  Incomplete  MRSA PCR Screening     Status: None   Collection Time:  09/10/16  6:00 PM  Result Value Ref Range Status   MRSA by PCR NEGATIVE NEGATIVE Final    Comment:        The GeneXpert MRSA Assay (FDA approved for NASAL specimens only), is one component of a comprehensive MRSA colonization surveillance program. It is not intended to diagnose MRSA infection nor to guide or monitor treatment for MRSA infections.     Radiology Studies: No results found. Scheduled Meds: . apixaban  2.5 mg Oral BID  . atorvastatin  20 mg Oral q1800  . darbepoetin (ARANESP) injection - NON-DIALYSIS  150 mcg Subcutaneous Q Thu-1800  . furosemide  160 mg Intravenous Q6H  . hydrocerin  1 application Topical Once per day on Tue Fri  . insulin aspart  0-5 Units Subcutaneous QHS  . insulin aspart  0-9 Units Subcutaneous TID WC  . iopamidol  30 mL Oral Once  . nystatin  1 g Topical BID  . polyethylene glycol  17 g Oral Daily  . sodium chloride flush  3 mL Intravenous Q12H   Continuous Infusions:   LOS: 9 days   Kerney Elbe, DO [facility] Pager [phone]  If 7PM-7AM, please contact night-coverage [email or web address] Password [email or web address] 09/14/2016, 9:16 AM

## 2016-09-14 NOTE — Progress Notes (Signed)
[facility] ROUNDING NOTE   Subjective:   Interval History:  Unchanged appears to be diuresing well  Objective:  Vital signs in last 24 hours:  Temp:  [97.8 F (36.6 C)-99.1 F (37.3 C)] 99.1 F (37.3 C) (01/20 0400) Pulse Rate:  [56-71] 63 (01/20 0800) Resp:  [14-30] 20 (01/20 0800) BP: (131-186)/(44-89) 172/45 (01/20 0800) SpO2:  [89 %-100 %] 96 % (01/20 0800) Weight:  [119.5 kg (263 lb 7.2 oz)] 119.5 kg (263 lb 7.2 oz) (01/20 0200)  Weight change: 0 kg (0 lb) Filed Weights   09/12/16 0600 09/13/16 0132 09/14/16 0200  Weight: 123.7 kg (272 lb 11.3 oz) 119.5 kg (263 lb 7.2 oz) 119.5 kg (263 lb 7.2 oz)    Intake/Output: I/O last 3 completed shifts: In: 22 [P.O.:980; IV Piggyback:364] Out: [id number] [Urine:4430]   Intake/Output this shift:  Total I/O In: 66 [IV Piggyback:66] Out: 400 [Urine:400]  CVS- RRR RS- CTA ABD- BS present soft non-distended EXT- no edema   Basic Metabolic Panel:  Recent Labs Lab 09/10/16 0820 09/11/16 0329 09/12/16 0350 09/13/16 0339 09/14/16 0325  NA 137 137 136 137 137  K 5.3* 5.5* 5.1 4.8 4.8  CL 105 106 104 105 104  CO2 22 23 23 23 23   GLUCOSE 171* 123* 155* 137* 122*  BUN 58* 63* 65* 67* 63*  CREATININE 3.66* 3.69* 4.07* 3.97* 3.96*  CALCIUM 8.2* 8.0* 8.0* 8.1* 8.1*  MG  --   --  1.6* 1.5* 1.5*  PHOS  --   --  5.8* 5.9* 5.7*    Liver Function Tests:  Recent Labs Lab 09/12/16 0350 09/13/16 0339 09/14/16 0325  AST 7*  --  10*  ALT 9*  --  12*  ALKPHOS 51  --  55  BILITOT 0.7  --  0.7  PROT 6.0*  --  6.2*  ALBUMIN 2.7* 2.8* 2.7*   No results for input(s): LIPASE, AMYLASE in the last 168 hours. No results for input(s): AMMONIA in the last 168 hours.  CBC:  Recent Labs Lab 09/10/16 1129 09/11/16 0329 09/12/16 0350 09/13/16 0339 09/14/16 0325  WBC 23.7* 15.3* 13.3* 9.8 7.7  NEUTROABS  --   --  6.2 5.2 3.6  HGB 7.7* 7.1* 7.0* 7.4* 7.5*  HCT 25.1* 22.8* 23.0* 23.7* 24.6*  MCV 78.4 77.6* 77.7*  76.7* 76.9*  PLT 96* 95* 106* 140* 179    Cardiac Enzymes:  Recent Labs Lab 09/09/16 1728 09/10/16 0820  TROPONINI <0.03 <0.03    BNP: Invalid input(s): POCBNP  CBG:  Recent Labs Lab 09/12/16 2113 09/13/16 0737 09/13/16 1154 09/13/16 2200 09/14/16 0756  GLUCAP 136* 169* 166* 128* 143*    Microbiology: Results for orders placed or performed during the hospital encounter of 09/05/16  MRSA PCR Screening     Status: None   Collection Time: 09/05/16 10:29 PM  Result Value Ref Range Status   MRSA by PCR NEGATIVE NEGATIVE Final    Comment:        The GeneXpert MRSA Assay (FDA approved for NASAL specimens only), is one component of a comprehensive MRSA colonization surveillance program. It is not intended to diagnose MRSA infection nor to guide or monitor treatment for MRSA infections.   Culture, blood (routine x 2)     Status: None (Preliminary result)   Collection Time: 09/10/16  1:30 PM  Result Value Ref Range Status   Specimen Description BLOOD RIGHT ARM  Final   Special Requests BOTTLES DRAWN AEROBIC AND ANAEROBIC 10CC  Final  Culture   Final    NO GROWTH 4 DAYS Performed at [facility], [place] [address] [place], [place] [address]    Report Status PENDING  Incomplete  Culture, blood (routine x 2)     Status: None (Preliminary result)   Collection Time: 09/10/16  3:14 PM  Result Value Ref Range Status   Specimen Description RIGHT ANTECUBITAL  Final   Special Requests BOTTLES DRAWN AEROBIC AND ANAEROBIC 10CC  Final   Culture   Final    NO GROWTH 4 DAYS Performed at [facility], [place] [address], [place], [place] [address]    Report Status PENDING  Incomplete  MRSA PCR Screening     Status: None   Collection Time: 09/10/16  6:00 PM  Result Value Ref Range Status   MRSA by PCR NEGATIVE NEGATIVE Final    Comment:        The GeneXpert MRSA Assay (FDA approved for NASAL specimens only), is one component of a comprehensive MRSA  colonization surveillance program. It is not intended to diagnose MRSA infection nor to guide or monitor treatment for MRSA infections.     Coagulation Studies: No results for input(s): LABPROT, INR in the last 72 hours.  Urinalysis:  Recent Labs  09/12/16 1323  COLORURINE YELLOW  LABSPEC 1.011  PHURINE 5.0  GLUCOSEU NEGATIVE  HGBUR MODERATE*  BILIRUBINUR NEGATIVE  KETONESUR NEGATIVE  PROTEINUR NEGATIVE  NITRITE NEGATIVE  LEUKOCYTESUR NEGATIVE      Imaging: No results found.   Medications:    . apixaban  2.5 mg Oral BID  . atorvastatin  20 mg Oral q1800  . darbepoetin (ARANESP) injection - NON-DIALYSIS  150 mcg Subcutaneous Q Thu-1800  . furosemide  160 mg Intravenous Q6H  . hydrocerin  1 application Topical Once per day on Tue Fri  . insulin aspart  0-5 Units Subcutaneous QHS  . insulin aspart  0-9 Units Subcutaneous TID WC  . iopamidol  30 mL Oral Once  . nystatin  1 g Topical BID  . polyethylene glycol  17 g Oral Daily  . sodium chloride flush  3 mL Intravenous Q12H   sodium chloride, acetaminophen, bisacodyl, gi cocktail, hydrALAZINE, HYDROcodone-acetaminophen, ipratropium-albuterol, ondansetron (ZOFRAN) IV, sodium chloride flush  Assessment/ Plan:  1. AKI- initially pt diuresed with good results and creatinine trended down. Then around 1/15-1/16 UOP dropped off and creatinine started to go up again. Ultrasound did not show obstruction. This was coincident with lower BPs. DDX is ATN or less likely acute GN with TNTC RBCs in urine (although maybe due to foley). Creatinine appears to be trending down with improved urine output  2. CKD- at baseline with creatinine of 2.5 3. Anemia-hgb in the 7's. Discontinued  vanc /zosyn due to sepsis type picture- no iron given ESA 4. HTN/volume-  Continues to diurese    LOS: 9 [name] W @TODAY @11 :32 AM

## 2016-09-15 LAB — CULTURE, BLOOD (ROUTINE X 2)
CULTURE: NO GROWTH
Culture: NO GROWTH

## 2016-09-15 LAB — COMPREHENSIVE METABOLIC PANEL
ALBUMIN: 2.7 g/dL — AB (ref 3.5–5.0)
ALK PHOS: 50 U/L (ref 38–126)
ALT: 11 U/L — ABNORMAL LOW (ref 17–63)
AST: 10 U/L — AB (ref 15–41)
Anion gap: 10 (ref 5–15)
BILIRUBIN TOTAL: 0.7 mg/dL (ref 0.3–1.2)
BUN: 64 mg/dL — ABNORMAL HIGH (ref 6–20)
CO2: 25 mmol/L (ref 22–32)
Calcium: 8.2 mg/dL — ABNORMAL LOW (ref 8.9–10.3)
Chloride: 103 mmol/L (ref 101–111)
Creatinine, Ser: 3.47 mg/dL — ABNORMAL HIGH (ref 0.61–1.24)
GFR calc Af Amer: 18 mL/min — ABNORMAL LOW (ref >60)
GFR calc non Af Amer: 15 mL/min — ABNORMAL LOW (ref >60)
Glucose, Bld: 152 mg/dL — ABNORMAL HIGH (ref 65–99)
POTASSIUM: 4.7 mmol/L (ref 3.5–5.1)
Sodium: 138 mmol/L (ref 135–145)
Total Protein: 6.7 g/dL (ref 6.5–8.1)

## 2016-09-15 LAB — CBC WITH DIFFERENTIAL/PLATELET
BASOS ABS: 0 10*3/uL (ref 0.0–0.1)
BASOS PCT: 0 %
Eosinophils Absolute: 0.1 10*3/uL (ref 0.0–0.7)
Eosinophils Relative: 1 %
HEMATOCRIT: 26.2 % — AB (ref 39.0–52.0)
HEMOGLOBIN: 8 g/dL — AB (ref 13.0–17.0)
Lymphocytes Relative: 31 %
Lymphs Abs: 2.2 10*3/uL (ref 0.7–4.0)
MCH: 23.6 pg — ABNORMAL LOW (ref 26.0–34.0)
MCHC: 30.5 g/dL (ref 30.0–36.0)
MCV: 77.3 fL — ABNORMAL LOW (ref 78.0–100.0)
Monocytes Absolute: 1.6 10*3/uL — ABNORMAL HIGH (ref 0.1–1.0)
Monocytes Relative: 23 %
NEUTROS ABS: 3.2 10*3/uL (ref 1.7–7.7)
Neutrophils Relative %: 45 %
Platelets: 189 10*3/uL (ref 150–400)
RBC: 3.39 MIL/uL — ABNORMAL LOW (ref 4.22–5.81)
RDW: 16.9 % — ABNORMAL HIGH (ref 11.5–15.5)
WBC: 7.1 10*3/uL (ref 4.0–10.5)

## 2016-09-15 LAB — PHOSPHORUS: Phosphorus: 5.6 mg/dL — ABNORMAL HIGH (ref 2.5–4.6)

## 2016-09-15 LAB — MAGNESIUM: MAGNESIUM: 1.5 mg/dL — AB (ref 1.7–2.4)

## 2016-09-15 LAB — GLUCOSE, CAPILLARY
GLUCOSE-CAPILLARY: 215 mg/dL — AB (ref 65–99)
Glucose-Capillary: 169 mg/dL — ABNORMAL HIGH (ref 65–99)

## 2016-09-15 MED ORDER — FUROSEMIDE 10 MG/ML IJ SOLN
120.0000 mg | Freq: Two times a day (BID) | INTRAVENOUS | Status: DC
  Administered 2016-09-15 – 2016-09-16 (×2): 120 mg via INTRAVENOUS
  Filled 2016-09-15: qty 10
  Filled 2016-09-15: qty 2

## 2016-09-15 NOTE — Progress Notes (Signed)
[facility] ROUNDING NOTE   Subjective:   Interval History: no complaints  Resting comfortably   Objective:  Vital signs in last 24 hours:  Temp:  [97.5 F (36.4 C)-98.1 F (36.7 C)] 97.8 F (36.6 C) (01/21 0800) Pulse Rate:  [56-79] 73 (01/21 1100) Resp:  [13-24] 17 (01/21 1100) BP: (153-197)/(45-65) 153/45 (01/21 0900) SpO2:  [94 %-100 %] 100 % (01/21 1100) Weight:  [113.3 kg (249 lb 12.5 oz)] 113.3 kg (249 lb 12.5 oz) (01/21 0600)  Weight change: -6.2 kg (-13 lb 10.7 oz) Filed Weights   09/13/16 0132 09/14/16 0200 09/15/16 0600  Weight: 119.5 kg (263 lb 7.2 oz) 119.5 kg (263 lb 7.2 oz) 113.3 kg (249 lb 12.5 oz)    Intake/Output: I/O last 3 completed shifts: In: 922 [P.O.:590; IV Piggyback:332] Out: 6080 [Urine:6080]   Intake/Output this shift:  Total I/O In: 386 [P.O.:320; IV Piggyback:66] Out: -   CVS- RRR RS- CTA ABD- BS present soft non-distended EXT- no edema   Basic Metabolic Panel:  Recent Labs Lab 09/11/16 0329 09/12/16 0350 09/13/16 0339 09/14/16 0325 09/15/16 0335  NA 137 136 137 137 138  K 5.5* 5.1 4.8 4.8 4.7  CL 106 104 105 104 103  CO2 23 23 23 23 25   GLUCOSE 123* 155* 137* 122* 152*  BUN 63* 65* 67* 63* 64*  CREATININE 3.69* 4.07* 3.97* 3.96* 3.47*  CALCIUM 8.0* 8.0* 8.1* 8.1* 8.2*  MG  --  1.6* 1.5* 1.5* 1.5*  PHOS  --  5.8* 5.9* 5.7* 5.6*    Liver Function Tests:  Recent Labs Lab 09/12/16 0350 09/13/16 0339 09/14/16 0325 09/15/16 0335  AST 7*  --  10* 10*  ALT 9*  --  12* 11*  ALKPHOS 51  --  55 50  BILITOT 0.7  --  0.7 0.7  PROT 6.0*  --  6.2* 6.7  ALBUMIN 2.7* 2.8* 2.7* 2.7*   No results for input(s): LIPASE, AMYLASE in the last 168 hours. No results for input(s): AMMONIA in the last 168 hours.  CBC:  Recent Labs Lab 09/11/16 0329 09/12/16 0350 09/13/16 0339 09/14/16 0325 09/15/16 0335  WBC 15.3* 13.3* 9.8 7.7 7.1  NEUTROABS  --  6.2 5.2 3.6 3.2  HGB 7.1* 7.0* 7.4* 7.5* 8.0*  HCT 22.8*  23.0* 23.7* 24.6* 26.2*  MCV 77.6* 77.7* 76.7* 76.9* 77.3*  PLT 95* 106* 140* 179 189    Cardiac Enzymes:  Recent Labs Lab 09/09/16 1728 09/10/16 0820  TROPONINI <0.03 <0.03    BNP: Invalid input(s): POCBNP  CBG:  Recent Labs Lab 09/13/16 2200 09/14/16 0756 09/14/16 1146 09/14/16 1640 09/14/16 2130  GLUCAP 128* 143* 163* 76* 171*    Microbiology: Results for orders placed or performed during the hospital encounter of 09/05/16  MRSA PCR Screening     Status: None   Collection Time: 09/05/16 10:29 PM  Result Value Ref Range Status   MRSA by PCR NEGATIVE NEGATIVE Final    Comment:        The GeneXpert MRSA Assay (FDA approved for NASAL specimens only), is one component of a comprehensive MRSA colonization surveillance program. It is not intended to diagnose MRSA infection nor to guide or monitor treatment for MRSA infections.   Culture, blood (routine x 2)     Status: None (Preliminary result)   Collection Time: 09/10/16  1:30 PM  Result Value Ref Range Status   Specimen Description BLOOD RIGHT ARM  Final   Special Requests BOTTLES DRAWN AEROBIC AND  ANAEROBIC 10CC  Final   Culture   Final    NO GROWTH 4 DAYS Performed at [facility], [place] [address] [place], [place] [address]    Report Status PENDING  Incomplete  Culture, blood (routine x 2)     Status: None (Preliminary result)   Collection Time: 09/10/16  3:14 PM  Result Value Ref Range Status   Specimen Description RIGHT ANTECUBITAL  Final   Special Requests BOTTLES DRAWN AEROBIC AND ANAEROBIC 10CC  Final   Culture   Final    NO GROWTH 4 DAYS Performed at [facility], [place] [address] [place], [place] [address]    Report Status PENDING  Incomplete  MRSA PCR Screening     Status: None   Collection Time: 09/10/16  6:00 PM  Result Value Ref Range Status   MRSA by PCR NEGATIVE NEGATIVE Final    Comment:        The GeneXpert MRSA Assay (FDA approved for NASAL specimens only), is one  component of a comprehensive MRSA colonization surveillance program. It is not intended to diagnose MRSA infection nor to guide or monitor treatment for MRSA infections.     Coagulation Studies: No results for input(s): LABPROT, INR in the last 72 hours.  Urinalysis:  Recent Labs  09/12/16 1323  COLORURINE YELLOW  LABSPEC 1.011  PHURINE 5.0  GLUCOSEU NEGATIVE  HGBUR MODERATE*  BILIRUBINUR NEGATIVE  KETONESUR NEGATIVE  PROTEINUR NEGATIVE  NITRITE NEGATIVE  LEUKOCYTESUR NEGATIVE      Imaging: No results found.   Medications:    . apixaban  2.5 mg Oral BID  . atorvastatin  20 mg Oral q1800  . darbepoetin (ARANESP) injection - NON-DIALYSIS  150 mcg Subcutaneous Q Thu-1800  . furosemide  160 mg Intravenous Q6H  . hydrocerin  1 application Topical Once per day on Tue Fri  . insulin aspart  0-5 Units Subcutaneous QHS  . insulin aspart  0-9 Units Subcutaneous TID WC  . iopamidol  30 mL Oral Once  . nystatin  1 g Topical BID  . polyethylene glycol  17 g Oral Daily  . sodium chloride flush  3 mL Intravenous Q12H   sodium chloride, acetaminophen, bisacodyl, gi cocktail, hydrALAZINE, HYDROcodone-acetaminophen, ipratropium-albuterol, ondansetron (ZOFRAN) IV, sodium chloride flush  Assessment/ Plan:  1. AKI- initially pt diuresed with good results and creatinine started  To increase and we were consulted . Creatinine appears to be trending down with improved urine output  2. CKD- at baseline with creatinine of 2.5 3. Anemia-hgb in the 7's. Discontinued  vanc /zosyn due to sepsis type picture- no iron given ESA 4. HTN/volume-  Continues to diurese will decrease lasix to 160mg  IV BID    LOS: 10 Caz Weaver W @TODAY @11 :19 AM

## 2016-09-15 NOTE — Progress Notes (Signed)
PROGRESS NOTE    [name]  [id number] DOB: 04-16-1934 DOA: 09/05/2016 PCP: [name], PA   Brief Narrative:  [name] Jeffersonis a 83 y.o.malewith PMH significant for chronic diastolic HF, CKD stage, 3, obesity, LE lymphedema, CAD, HTN, atrial fibrillation (on eliquis), HLD and diabetes with nephropathy; who presented to ED from his SNF due to SOB, worsening LE edema and inability to urinate. Patient endorses been unable to pee despite lasix for the last 14 hours or so. Also has experienced SOB and orthopnea for the last 2-3 days, along with worsening on his bilateral LE edema (now unable to pick his left up on his own or to walk properly due to swelling). He denies CP, Fever, HA's, blurred vision, abd pain, dysuria, hematuria, melena, hematochezia or any other complaints. Currently being treated for Heart Failure Exacerbation with Anasarca. Cardiology consulted and signed off. Nephrology are consulted and following. Still being diuresed and improving slowly. Stable to be transferred out of SDU this Am.    Assessment & Plan:   Principal Problem:   Acute on chronic diastolic congestive heart failure (HCC) Active Problems:   Lymphedema   Acute renal failure superimposed on stage 3 chronic kidney disease (HCC)   Chronic diastolic CHF (congestive heart failure) (HCC)   Anemia of chronic renal failure, stage 3 (moderate)   Uncontrolled diabetes mellitus with diabetic nephropathy, with long-term current use of insulin (HCC)   PAF (paroxysmal atrial fibrillation) (HCC)   Atrial flutter (HCC)   Bradycardia   AKI (acute kidney injury) ([place])   HTN (hypertension)   Severe obesity (BMI >= 40) (HCC)   Mobitz type 1 second degree AV block   Pericardial effusion- moderate on CT scan   Leukocytosis  Acute on Chronic diastolic heart failure -previous echocardiogram noted grade 2 diastolic dysfunction (Q000111Q) and systolic dysfunction (0000000) -Echocardiogram showed EF 55-60%, left  ventricular diastolic function parameters are normal, left atrium was moderately to severely dilated -Cardiology Repeated ECHO; Showed IVC is Dilated possibly to Volume Overload -Not sure if patient's SOB and LE edema is due to CHF exacerbation vs undiagnosed sleep apnea? -BNP on admission 557 -Chest x-ray showed small left pleural effusion with primary congestion -Continue IV Lasix, IV lasix of 160 mg q6h decreased to IV Lasix 160 mg BID by Nephrology -Monitor intake and output, daily weights -Currently no ACEi or ARB due to renal function -Appreciated Cardiology Recc's -Continue to monitor Volume Status  Acute renal failure superimposed on CKD, stage IV -due to poor perfusion from CHF exacerbation and likely from decreased BP plus component of urinary retention most likely from BPH -Creatinine upon admission 3.67, baseline 2.6-2.9 -Creatinine trending down and now 3.47 -Renal US showed no hydronephrosis -Continue to Follow follow intake/output -D/C'd IV Vanc and Zosyn as serum Cr was worsening  -Nephrology Following and patient will have decreased Diureses with 160 mg of IV Lasix BID; Discussed with Dr. Justin Mend about Lasix gtt and he feels not as efficacious  -Nephrology does not feel like dialysis is indicated at this point  -Continue to Monitor Renal Function in AM  Pericardial Effusion -Cardiology Followed and appreciated Recc's -Repeat ECHO showed it was small and no RV Diastolic Collapse;  -Cardiology does not feel it is contributing to present clinical picture -Continue to Monitor  Leukocytosis, improved -Improved; WBC down to 7.1 -UA and CXR were unremarkable for infection -repeat UA Negative, CXR showed CHF -Blood cultures x2 showed NGTD at 5 days -Currently afebrile -Was on IV Vanc and IV Zosyn but  Discontinued now as not showing Signs/Symptoms of infection -Abd xray showed mild prominent air-filled loops of small and large bowel, mild adynamic ileus cannot be excluded.    -CT of Chest showed Moderate Pericardial Effusion with ?pericarditis; Volume Overload with Marked Anasarca, Moderate B/L Pleural Effusions and Small Ascites Noted. Multiple Segment Atelectacasis noded -Continue to Monitor  Hyperkalemia -Resolved; K+ is now 4.7 -Should Corrrect with Diuresis  -Repeat BMP in AM  Anemia of chronic renal failure, stage 3  -Hgb 8.0 -no signs of acute bleeding -will monitor trend and if Hgb less than 7 will need transfusion  -FOBT x3 pending  -Nephrology started Aranesep 150 mcg sq every Thursday -Continue to monitor CBC (may improve with diuresis)  Diabetes mellitus, type II with nephropathy  -Continue insulin size, CBG monitoring; Last CBG's have ranged from 137-162 -Lantus and glipizide held -Hemoglobin A1c 6.4  Paroxysmal Atrial fibrillation now A Flutter with 3:1 [place]  -Per Cardiology would Avoid BB and AV Nodal Blocking Agents  -Per Cardiology no indication of Pacemaker at this point  -Continue eliquis 2.5 mg po BID -CHADSVASC at least 4 (given age, h/o dCHF, DM, HTN) -Cardiology Followed and appreciated Recc's  Essential Hypertension -Added PRN HYDRALAZINE; May need to add home Hydralazine back  -All BP Meds held including Hydralazine, Coreg, Imdur -IV Lasix increased by Nephrology  CAD/Elevated troponin -no CP; but SOB could be equivalent to Angina -troponin peaked at 0.04, Currently flat and less than <0.03; unlikely ACS.  -Possibly secondary to demand. -Echocardiogram as listed above -Continue coreg, lipitor and Imdur, apixaban -Cardiology Following  Hyperlipidemia -Continue Statin  Acute urinary retention -as mentioned above; foley catheter placed while in ED -Flomax discontinued by Nephrology  Severe obesity  -Body mass index is 38.67 kg/m. -Will need to follow up with PCP upon discharge to discuss lifestyle modifications -Nutrition consulted -patient should have a sleep study as an  outpateint  Deconditioning -Not sure where patient is from.  H&P noted SNF, social work/case management noted Hotel.  -PT and OT consulted, recommended SNF once stable for D/C  Lower extremity edema -Unna boots in place, wound care consulted -Diuresis as above  Constipation -Improved -Continue bowel regimen  DVT prophylaxis: Anticoagulated with Apixaban Code Status: FULL CODE Family Communication: No Family present at bedside Disposition Plan: Transferred to Med/Surge  Consultants:   Nephrology  Cardiology  Procedures: Echocardiogram; Renal Ultrasound  Antimicrobials:  Anti-infectives    Start     Dose/Rate Route Frequency Ordered Stop   09/12/16 1600  vancomycin (VANCOCIN) 1,500 mg in sodium chloride 0.9 % 500 mL IVPB  Status:  Discontinued     1,500 mg 250 mL/hr over 120 Minutes Intravenous Every 48 hours 09/10/16 1550 09/12/16 1848   09/10/16 1600  vancomycin (VANCOCIN) 2,000 mg in sodium chloride 0.9 % 500 mL IVPB     2,000 mg 250 mL/hr over 120 Minutes Intravenous  Once 09/10/16 1549 09/10/16 1959   09/10/16 1600  piperacillin-tazobactam (ZOSYN) IVPB 2.25 g  Status:  Discontinued     2.25 g 100 mL/hr over 30 Minutes Intravenous Every 8 hours 09/10/16 1549 09/12/16 1848      Subjective: Seen and examined and had no complaints and wanted to have a bowel movement and get on bedside commode. Swelling decreasing and UOP increased with Cr Trending down. Stable to transfer to Med/Surge.  Objective: Vitals:   09/15/16 0900 09/15/16 1000 09/15/16 1100 09/15/16 1145  BP: (!) 153/45   (!) 158/57  Pulse: 68 79 73 88  Resp: 15 (!) 22 17 18   Temp:    97.8 F (36.6 C)  TempSrc:    Oral  SpO2: 99% 94% 100% 100%  Weight:      Height:        Intake/Output Summary (Last 24 hours) at 09/15/16 1703 Last data filed at 09/15/16 0930  Gross per 24 hour  Intake              726 ml  Output             3150 ml  Net            -2424 ml   Filed Weights   09/13/16 0132  09/14/16 0200 09/15/16 0600  Weight: 119.5 kg (263 lb 7.2 oz) 119.5 kg (263 lb 7.2 oz) 113.3 kg (249 lb 12.5 oz)   Examination: Physical Exam:  Constitutional: WN/WD, NAD appears calm Eyes: Lids and conjunctivae normal, sclerae anicteric  ENMT: External Ears, Nose appear normal. Grossly normal hearing.  Neck: Appears normal, supple, no cervical masses, normal ROM, no appreciable thyromegaly Respiratory: Diminished to Ausculatation bilaterally, no wheezing, rales, rhonchi or crackles.  Cardiovascular: RRR, no murmurs / rubs / gallops. S1 and S2 auscultated. Some Edema Abdomen: Soft, non-tender, non-distended. No masses palpated. No appreciable hepatosplenomegaly. Bowel sounds positive x4.  GU: Deferred. Musculoskeletal: No clubbing / cyanosis of digits/nails. No joint deformity upper and lower extremities. Wearing Una Boots and legs in ace bandages with some edema Skin: No rashes, lesions, ulcers. No induration on limited skin eval; Warm and dry.  Neurologic: CN 2-12 grossly intact with no focal deficits. Sensation intact in all 4 Extremities. Romberg sign cerebellar reflexes not assessed.  Psychiatric: Impaired judgment and insight. Alert and awake. Normal Mood and Affect.   Data Reviewed: I have personally reviewed following labs and imaging studies  CBC:  Recent Labs Lab 09/11/16 0329 09/12/16 0350 09/13/16 0339 09/14/16 0325 09/15/16 0335  WBC 15.3* 13.3* 9.8 7.7 7.1  NEUTROABS  --  6.2 5.2 3.6 3.2  HGB 7.1* 7.0* 7.4* 7.5* 8.0*  HCT 22.8* 23.0* 23.7* 24.6* 26.2*  MCV 77.6* 77.7* 76.7* 76.9* 77.3*  PLT 95* 106* 140* 179 [id number]   Basic Metabolic Panel:  Recent Labs Lab 09/11/16 0329 09/12/16 0350 09/13/16 0339 09/14/16 0325 09/15/16 0335  NA 137 136 137 137 138  K 5.5* 5.1 4.8 4.8 4.7  CL 106 104 105 104 103  CO2 23 23 23 23 25   GLUCOSE 123* 155* 137* 122* 152*  BUN 63* 65* 67* 63* 64*  CREATININE 3.69* 4.07* 3.97* 3.96* 3.47*  CALCIUM 8.0* 8.0* 8.1* 8.1* 8.2*  MG   --  1.6* 1.5* 1.5* 1.5*  PHOS  --  5.8* 5.9* 5.7* 5.6*   GFR: Estimated Creatinine Clearance: 21.6 mL/min (by C-G formula based on SCr of 3.47 mg/dL (H)). Liver Function Tests:  Recent Labs Lab 09/12/16 0350 09/13/16 0339 09/14/16 0325 09/15/16 0335  AST 7*  --  10* 10*  ALT 9*  --  12* 11*  ALKPHOS 51  --  55 50  BILITOT 0.7  --  0.7 0.7  PROT 6.0*  --  6.2* 6.7  ALBUMIN 2.7* 2.8* 2.7* 2.7*   No results for input(s): LIPASE, AMYLASE in the last 168 hours. No results for input(s): AMMONIA in the last 168 hours. Coagulation Profile: No results for input(s): INR, PROTIME in the last 168 hours. Cardiac Enzymes:  Recent Labs Lab 09/09/16 1728 09/10/16 0820  TROPONINI <0.03 <0.03   BNP (last  3 results) No results for input(s): PROBNP in the last 8760 hours. HbA1C: No results for input(s): HGBA1C in the last 72 hours. CBG:  Recent Labs Lab 09/14/16 0756 09/14/16 1146 09/14/16 1640 09/14/16 2130 09/15/16 1621  GLUCAP 143* 163* 211* 171* 215*   Lipid Profile: No results for input(s): CHOL, HDL, LDLCALC, TRIG, CHOLHDL, LDLDIRECT in the last 72 hours. Thyroid Function Tests: No results for input(s): TSH, T4TOTAL, FREET4, T3FREE, THYROIDAB in the last 72 hours. Anemia Panel: No results for input(s): VITAMINB12, FOLATE, FERRITIN, TIBC, IRON, RETICCTPCT in the last 72 hours. Sepsis Labs:  Recent Labs Lab 09/10/16 1608  LATICACIDVEN 1.5    Recent Results (from the past 240 hour(s))  MRSA PCR Screening     Status: None   Collection Time: 09/05/16 10:29 PM  Result Value Ref Range Status   MRSA by PCR NEGATIVE NEGATIVE Final    Comment:        The GeneXpert MRSA Assay (FDA approved for NASAL specimens only), is one component of a comprehensive MRSA colonization surveillance program. It is not intended to diagnose MRSA infection nor to guide or monitor treatment for MRSA infections.   Culture, blood (routine x 2)     Status: None   Collection Time: 09/10/16   1:30 PM  Result Value Ref Range Status   Specimen Description BLOOD RIGHT ARM  Final   Special Requests BOTTLES DRAWN AEROBIC AND ANAEROBIC 10CC  Final   Culture   Final    NO GROWTH 5 DAYS Performed at [facility], [place] [address], [place], [place] [address]    Report Status 09/15/2016 FINAL  Final  Culture, blood (routine x 2)     Status: None   Collection Time: 09/10/16  3:14 PM  Result Value Ref Range Status   Specimen Description RIGHT ANTECUBITAL  Final   Special Requests BOTTLES DRAWN AEROBIC AND ANAEROBIC 10CC  Final   Culture   Final    NO GROWTH 5 DAYS Performed at [facility], [place] [address], [place], [place] [address]    Report Status 09/15/2016 FINAL  Final  MRSA PCR Screening     Status: None   Collection Time: 09/10/16  6:00 PM  Result Value Ref Range Status   MRSA by PCR NEGATIVE NEGATIVE Final    Comment:        The GeneXpert MRSA Assay (FDA approved for NASAL specimens only), is one component of a comprehensive MRSA colonization surveillance program. It is not intended to diagnose MRSA infection nor to guide or monitor treatment for MRSA infections.     Radiology Studies: No results found. Scheduled Meds: . apixaban  2.5 mg Oral BID  . atorvastatin  20 mg Oral q1800  . darbepoetin (ARANESP) injection - NON-DIALYSIS  150 mcg Subcutaneous Q Thu-1800  . furosemide  120 mg Intravenous Q12H  . hydrocerin  1 application Topical Once per day on Tue Fri  . insulin aspart  0-5 Units Subcutaneous QHS  . insulin aspart  0-9 Units Subcutaneous TID WC  . iopamidol  30 mL Oral Once  . nystatin  1 g Topical BID  . polyethylene glycol  17 g Oral Daily  . sodium chloride flush  3 mL Intravenous Q12H   Continuous Infusions:   LOS: 10 days   Kerney Elbe, DO [facility] Pager [phone]  If 7PM-7AM, please contact night-coverage [email or web address] Password [email or web address] 09/15/2016, 5:03 PM

## 2016-09-16 LAB — CBC WITH DIFFERENTIAL/PLATELET
BASOS ABS: 0 10*3/uL (ref 0.0–0.1)
Basophils Relative: 0 %
EOS PCT: 1 %
Eosinophils Absolute: 0.1 10*3/uL (ref 0.0–0.7)
HEMATOCRIT: 26 % — AB (ref 39.0–52.0)
Hemoglobin: 7.9 g/dL — ABNORMAL LOW (ref 13.0–17.0)
LYMPHS PCT: 27 %
Lymphs Abs: 2.3 10*3/uL (ref 0.7–4.0)
MCH: 23.5 pg — AB (ref 26.0–34.0)
MCHC: 30.4 g/dL (ref 30.0–36.0)
MCV: 77.4 fL — AB (ref 78.0–100.0)
MONO ABS: 1.9 10*3/uL — AB (ref 0.1–1.0)
MONOS PCT: 22 %
NEUTROS ABS: 4.3 10*3/uL (ref 1.7–7.7)
Neutrophils Relative %: 50 %
Platelets: 203 10*3/uL (ref 150–400)
RBC: 3.36 MIL/uL — ABNORMAL LOW (ref 4.22–5.81)
RDW: 16.9 % — ABNORMAL HIGH (ref 11.5–15.5)
WBC: 8.5 10*3/uL (ref 4.0–10.5)

## 2016-09-16 LAB — COMPREHENSIVE METABOLIC PANEL
ALBUMIN: 2.7 g/dL — AB (ref 3.5–5.0)
ALK PHOS: 51 U/L (ref 38–126)
ALT: 9 U/L — AB (ref 17–63)
AST: 10 U/L — ABNORMAL LOW (ref 15–41)
Anion gap: 10 (ref 5–15)
BUN: 56 mg/dL — ABNORMAL HIGH (ref 6–20)
CO2: 25 mmol/L (ref 22–32)
CREATININE: 3.18 mg/dL — AB (ref 0.61–1.24)
Calcium: 8.1 mg/dL — ABNORMAL LOW (ref 8.9–10.3)
Chloride: 103 mmol/L (ref 101–111)
GFR calc Af Amer: 20 mL/min — ABNORMAL LOW (ref >60)
GFR calc non Af Amer: 17 mL/min — ABNORMAL LOW (ref >60)
GLUCOSE: 143 mg/dL — AB (ref 65–99)
Potassium: 4.5 mmol/L (ref 3.5–5.1)
SODIUM: 138 mmol/L (ref 135–145)
Total Bilirubin: 0.6 mg/dL (ref 0.3–1.2)
Total Protein: 6.4 g/dL — ABNORMAL LOW (ref 6.5–8.1)

## 2016-09-16 LAB — GLUCOSE, CAPILLARY
GLUCOSE-CAPILLARY: 227 mg/dL — AB (ref 65–99)
GLUCOSE-CAPILLARY: 150 mg/dL — AB (ref 65–99)
Glucose-Capillary: 158 mg/dL — ABNORMAL HIGH (ref 65–99)
Glucose-Capillary: 201 mg/dL — ABNORMAL HIGH (ref 65–99)
Glucose-Capillary: 177 mg/dL — ABNORMAL HIGH (ref 65–99)
Glucose-Capillary: 155 mg/dL — ABNORMAL HIGH (ref 65–99)

## 2016-09-16 LAB — PHOSPHORUS: Phosphorus: 4.7 mg/dL — ABNORMAL HIGH (ref 2.5–4.6)

## 2016-09-16 LAB — MAGNESIUM: Magnesium: 1.5 mg/dL — ABNORMAL LOW (ref 1.7–2.4)

## 2016-09-16 MED ORDER — FUROSEMIDE 40 MG PO TABS
120.0000 mg | ORAL_TABLET | Freq: Two times a day (BID) | ORAL | Status: DC
  Administered 2016-09-16 – 2016-09-18 (×4): 120 mg via ORAL
  Filled 2016-09-16 (×5): qty 3

## 2016-09-16 MED ORDER — HYDRALAZINE HCL 50 MG PO TABS
50.0000 mg | ORAL_TABLET | Freq: Three times a day (TID) | ORAL | Status: DC
  Administered 2016-09-16 – 2016-09-17 (×3): 50 mg via ORAL
  Filled 2016-09-16 (×3): qty 1

## 2016-09-16 NOTE — Progress Notes (Signed)
Occupational Therapy Treatment Patient Details Name: [name] MRN: [id number] DOB: 1934/03/24 Today's Date: 09/16/2016    History of present illness 83 y.o. male with PMH significant for chronic diastolic HF, CKD stage, 3, obesity, LE lymphedema, CAD, HTN, atrial fibrillation (on eliquis), HLD,  and diabetes and admitted for acute on chronic diastolic heart failure   OT comments  This 83 yo male admitted with above presents to acute OT today with lethargy when not engaged in interaction and overall at same level he has been for OT on eval and last treatment session. He has the potential to make gains since when you do engage him he tries his best to do as much as he can. He will continue to benefit from acute OT with follow up OT at SNF.  Follow Up Recommendations  SNF    Equipment Recommendations  3 in 1 bedside commode (wide)       Precautions / Restrictions Precautions Precautions: Fall Precaution Comments:  bil Unna boots Restrictions Weight Bearing Restrictions: No       Mobility Bed Mobility Overal bed mobility: Needs Assistance Bed Mobility: Supine to Sit     Supine to sit: Mod assist;+2 for physical assistance        Transfers Overall transfer level: Needs assistance   Transfers: Sit to/from Stand Sit to Stand: Mod assist;+2 physical assistance;From elevated surface (x2)              Balance Overall balance assessment: Needs assistance Sitting-balance support: No upper extremity supported;Feet supported Sitting balance-Leahy Scale: Fair     Standing balance support: Bilateral upper extremity supported;During functional activity Standing balance-Leahy Scale: Poor                     ADL                           Toilet Transfer: Moderate assistance;+2 for physical assistance (with use of Denna Haggard )                              Cognition   Behavior During Therapy: Flat affect Overall Cognitive Status: No  family/caregiver present to determine baseline cognitive functioning                  General Comments: was lethargic but would stay awake as long as interacting with him and having him move                 Pertinent Vitals/ Pain       Pain Assessment: No/denies pain         Frequency  Min 2X/week        Progress Toward Goals  OT Goals(current goals can now be found in the care plan section)  Progress towards OT goals: Not progressing toward goals - comment (remains pretty much at same level as he was on eval and treatment session; however sitting balance is good at this time unsupported EOB)     Plan Discharge plan remains appropriate       End of Session Equipment Utilized During Treatment:  (sara stedy)   Activity Tolerance Patient tolerated treatment well   Patient Left in chair;with call bell/phone within reach;with chair alarm set   Nurse Communication Need for lift equipment;Mobility status        Time: [id number] OT Time Calculation (min): 20 min  Charges: OT General  Charges $OT Visit: 1 Procedure OT Treatments $Self Care/Home Management : 8-22 mins  [name] [id number] 09/16/2016, 1:40 PM

## 2016-09-16 NOTE — Care Management Important Message (Signed)
Important Message  Patient Details  Name: [name] MRN: [id number] Date of Birth: [birth year]   Medicare Important Message Given:  Yes    Kerin Salen 09/16/2016, 11:54 AMImportant Message  Patient Details  Name: [name] MRN: [id number] Date of Birth: [birth year]   Medicare Important Message Given:  Yes    Kerin Salen 09/16/2016, 11:54 AM

## 2016-09-16 NOTE — Progress Notes (Addendum)
  [facility] Progress Note   Subjective: UOP 2.5 L, wt's down 112kg .  Creat down 3.18  Vitals:   09/15/16 2033 09/16/16 0552 09/16/16 0812 09/16/16 0814  BP: (!) 144/58 (!) 143/54    Pulse: 68 65    Resp: 18 18    Temp: 98.4 F (36.9 C) 98.2 F (36.8 C)    TempSrc: Oral Oral    SpO2: 100% 100%  100%  Weight:   112.1 kg (247 lb 1.6 oz)   Height:        Inpatient medications: . apixaban  2.5 mg Oral BID  . atorvastatin  20 mg Oral q1800  . darbepoetin (ARANESP) injection - NON-DIALYSIS  150 mcg Subcutaneous Q Thu-1800  . furosemide  120 mg Intravenous Q12H  . hydrocerin  1 application Topical Once per day on Tue Fri  . insulin aspart  0-5 Units Subcutaneous QHS  . insulin aspart  0-9 Units Subcutaneous TID WC  . iopamidol  30 mL Oral Once  . nystatin  1 g Topical BID  . polyethylene glycol  17 g Oral Daily  . sodium chloride flush  3 mL Intravenous Q12H    sodium chloride, acetaminophen, bisacodyl, gi cocktail, hydrALAZINE, HYDROcodone-acetaminophen, ipratropium-albuterol, ondansetron (ZOFRAN) IV, sodium chloride flush  Exam: Frail, elderly obese [name] No distress Mild jvd Chest bibasilar fine rales, no ^WOB RRR no mrg Abd obese, soft ntnd +bs Ext chronic venous stasis and edema 1-2+ up to hips UE edema 1+ bilat Neuro gen'd weaknes 3-/5 , Ox 3        Assessment: 1. AKI - resolving, creat down 3.1 2. CKD4 baseline creat 2.5- 3 3. Vol excess - switched to po lasix 120 mg bid. Adjust as needed 4. Diast CHF acute/ chronic 5. DM longstanding 6. Chron afib 7. Obesity 8. Chron debility - came from SNF  Plan - will arrange for outpatient renal f/u.  Will sign off.    Kelly Splinter MD [facility] pager [phone]   09/16/2016, 8:16 AM    Recent Labs Lab 09/14/16 0325 09/15/16 0335 09/16/16 0548  NA 137 138 138  K 4.8 4.7 4.5  CL 104 103 103  CO2 23 25 25   GLUCOSE 122* 152* 143*  BUN 63* 64* 56*  CREATININE 3.96* 3.47*  3.18*  CALCIUM 8.1* 8.2* 8.1*  PHOS 5.7* 5.6* 4.7*    Recent Labs Lab 09/14/16 0325 09/15/16 0335 09/16/16 0548  AST 10* 10* 10*  ALT 12* 11* 9*  ALKPHOS 55 50 51  BILITOT 0.7 0.7 0.6  PROT 6.2* 6.7 6.4*  ALBUMIN 2.7* 2.7* 2.7*    Recent Labs Lab 09/14/16 0325 09/15/16 0335 09/16/16 0548  WBC 7.7 7.1 8.5  NEUTROABS 3.6 3.2 4.3  HGB 7.5* 8.0* 7.9*  HCT 24.6* 26.2* 26.0*  MCV 76.9* 77.3* 77.4*  PLT 179 189 203   Iron/TIBC/Ferritin/ %Sat    Component Value Date/Time   IRON 17 (L) 11/24/2014 1245   TIBC 324 11/24/2014 1245   FERRITIN 18 (L) 11/24/2014 1245   IRONPCTSAT 5 (L) 11/24/2014 1245

## 2016-09-16 NOTE — Progress Notes (Signed)
OT Cancellation Note  Patient Details Name: [name] MRN: [id number] DOB: 1933-11-09   Cancelled Treatment:     Pt currently in the middle of eating his lunch--will try back at later time for treatment as schedule allows.  [name] [id number] 09/16/2016, 12:18 PM

## 2016-09-16 NOTE — Progress Notes (Signed)
PROGRESS NOTE    [name]  [id number] DOB: [birth year] DOA: 09/05/2016 PCP: [name], PA   Brief Narrative:  [name] Jeffersonis a 83 y.o.malewith PMH significant for chronic diastolic HF, CKD stage, 3, obesity, LE lymphedema, CAD, HTN, atrial fibrillation (on eliquis), HLD and diabetes with nephropathy; who presented to ED from his SNF due to SOB, worsening LE edema and inability to urinate. Patient endorses been unable to pee despite lasix for the last 14 hours or so. Also has experienced SOB and orthopnea for the last 2-3 days, along with worsening on his bilateral LE edema (now unable to pick his left up on his own or to walk properly due to swelling). He denies CP, Fever, HA's, blurred vision, abd pain, dysuria, hematuria, melena, hematochezia or any other complaints. Currently being treated for Heart Failure Exacerbation with Anasarca. Cardiology consulted and signed off. Nephrology are consulted and now signed off as diuresis is improved. Likely D/C to SNF in Am.   Assessment & Plan:   Principal Problem:   Acute on chronic diastolic congestive heart failure (HCC) Active Problems:   Lymphedema   Acute renal failure superimposed on stage 3 chronic kidney disease (HCC)   Chronic diastolic CHF (congestive heart failure) (HCC)   Anemia of chronic renal failure, stage 3 (moderate)   Uncontrolled diabetes mellitus with diabetic nephropathy, with long-term current use of insulin (HCC)   PAF (paroxysmal atrial fibrillation) (HCC)   Atrial flutter (HCC)   Bradycardia   AKI (acute kidney injury) ([place])   HTN (hypertension)   Severe obesity (BMI >= 40) (HCC)   Mobitz type 1 second degree AV block   Pericardial effusion- moderate on CT scan   Leukocytosis  Acute on Chronic diastolic heart failure -previous echocardiogram noted grade 2 diastolic dysfunction ([id number]) and systolic dysfunction (0000000) -Echocardiogram showed EF 55-60%, left ventricular diastolic function parameters  are normal, left atrium was moderately to severely dilated -Cardiology Repeated ECHO; Showed IVC is Dilated possibly to Volume Overload -Not sure if patient's SOB and LE edema is due to CHF exacerbation vs undiagnosed sleep apnea? -BNP on admission 557 -Chest x-ray showed small left pleural effusion with primary congestion -Continue IV Lasix, IV lasix of 160 mg q6h decreased to IV Lasix 160 mg BID by Nephrology -Monitor intake and output, daily weights -Currently no ACEi or ARB due to renal function -Appreciated Cardiology Recc's -Continue to monitor Volume Status  Acute renal failure superimposed on CKD, stage Iv, improving  -due to poor perfusion from CHF exacerbation and likely from decreased BP plus component of urinary retention most likely from BPH -Creatinine upon admission 3.67, baseline 2.6-2.9 -Creatinine trending down and now 3.18 -Renal US showed no hydronephrosis -Continue to Follow follow intake/output -D/C'd IV Vanc and Zosyn as serum Cr was worsening  -Nephrology Followed and changed patient from IV Diureses with 160 mg of IV Lasix BID to 120 mg po BID; -Nephrology does not feel like dialysis is indicated at this point; Signed off and will follow up as an outpatient -Continue to Monitor Renal Function in AM  Pericardial Effusion -Cardiology Followed and appreciated Recc's -Repeat ECHO showed it was small and no RV Diastolic Collapse;  -Cardiology does not feel it is contributing to present clinical picture -Continue to Monitor as an outpatient   Leukocytosis, improved -Improved; WBC down to 8.5 -UA and CXR were unremarkable for infection -repeat UA Negative, CXR showed CHF -Blood cultures x2 showed NGTD at 5 days -Currently afebrile -Was on IV Vanc and IV Zosyn  but Discontinued now as not showing Signs/Symptoms of infection -Abd xray showed mild prominent air-filled loops of small and large bowel, mild adynamic ileus cannot be excluded.  -CT of Chest showed  Moderate Pericardial Effusion with ?pericarditis; Volume Overload with Marked Anasarca, Moderate B/L Pleural Effusions and Small Ascites Noted. Multiple Segment Atelectacasis noded -Continue to Monitor and Repeat CBC in AM  Hyperkalemia -Resolved; K+ is now 4.5 -C/w po Diuresis -Repeat BMP in AM  Anemia of chronic renal failure -Hgb 7.9 -no signs of acute bleeding -will monitor trend and if Hgb less than 7 will need transfusion  -FOBT x3 pending  -Nephrology started Aranesep 150 mcg sq every Thursday -Continue to monitor CBC (may improve with diuresis)  Diabetes mellitus, type II with nephropathy  -Continue insulin SSI, CBG monitoring; Last CBG's have ranged from 150-201 -Lantus and glipizide held -Hemoglobin A1c 6.4  Paroxysmal Atrial fibrillation now A Flutter with 3:1 [place]  -Per Cardiology would Avoid BB and AV Nodal Blocking Agents  -Per Cardiology no indication of Pacemaker at this point  -Continue eliquis 2.5 mg po BID -CHADSVASC at least 4 (given age, h/o dCHF, DM, HTN) -Cardiology Followed and appreciated Recc's; Follow up with Cardiology as an outpatient.   Essential Hypertension -Added PRN HYDRALAZINE; Will Add Home Hydralazine back  -BP Meds held including Coreg, Imdur, and Amlodipine -Changed to po Lasix by Nephrology  CAD/Elevated troponin -no CP; but SOB could be equivalent to Angina -troponin peaked at 0.04, Currently flat and less than <0.03; unlikely ACS.  -Possibly secondary to demand. -Echocardiogram as listed above -Continue coreg, lipitor and Imdur, apixaban -Cardiology Following  Hyperlipidemia -Continue Statin  Acute urinary retention -as mentioned above; foley catheter placed while in ED -Flomax discontinued by Nephrology; Follow up as outpatient   Severe obesity  -Body mass index is 38.67 kg/m. -Will need to follow up with PCP upon discharge to discuss lifestyle modifications -Nutrition consulted -patient should have a sleep  study as an outpateint  Deconditioning -Not sure where patient is from.  H&P noted SNF, social work/case management noted [facility].  -PT and OT consulted, recommended SNF once stable for D/C  Lower extremity edema -Unna boots in place, wound care consulted -Diuresis as above with po Lasix  Constipation -Improved -Continue bowel regimen  DVT prophylaxis: Anticoagulated with Apixaban Code Status: FULL CODE Family Communication: No Family present at bedside Disposition Plan: SNF in AM  Consultants:   Nephrology  Cardiology  Procedures: Echocardiogram; Renal Ultrasound  Antimicrobials:  Anti-infectives    Start     Dose/Rate Route Frequency Ordered Stop   09/12/16 1600  vancomycin (VANCOCIN) 1,500 mg in sodium chloride 0.9 % 500 mL IVPB  Status:  Discontinued     1,500 mg 250 mL/hr over 120 Minutes Intravenous Every 48 hours 09/10/16 1550 09/12/16 1848   09/10/16 1600  vancomycin (VANCOCIN) 2,000 mg in sodium chloride 0.9 % 500 mL IVPB     2,000 mg 250 mL/hr over 120 Minutes Intravenous  Once 09/10/16 1549 09/10/16 1959   09/10/16 1600  piperacillin-tazobactam (ZOSYN) IVPB 2.25 g  Status:  Discontinued     2.25 g 100 mL/hr over 30 Minutes Intravenous Every 8 hours 09/10/16 1549 09/12/16 1848     Subjective: Seen and examined and was agitated and did not really want to participate. Had no Nausea or Vomiting. States he didn't sleep well.  Objective: Vitals:   09/15/16 1100 09/15/16 1145 09/15/16 2033 09/16/16 0552  BP:  (!) 158/57 (!) 144/58 (!) 143/54  Pulse:  73 88 68 65  Resp: 17 18 18 18   Temp:  97.8 F (36.6 C) 98.4 F (36.9 C) 98.2 F (36.8 C)  TempSrc:  Oral Oral Oral  SpO2: 100% 100% 100% 100%  Weight:      Height:        Intake/Output Summary (Last 24 hours) at 09/16/16 [id number] Last data filed at 09/16/16 0503  Gross per 24 hour  Intake             1396 ml  Output             2550 ml  Net            -1154 ml   Filed Weights   09/13/16 0132 09/14/16  0200 09/15/16 0600  Weight: 119.5 kg (263 lb 7.2 oz) 119.5 kg (263 lb 7.2 oz) 113.3 kg (249 lb 12.5 oz)   Examination: Physical Exam:  Constitutional: WN/WD, NAD appears agitated and does not want to participate  Eyes: Lids and conjunctivae normal, sclerae anicteric  ENMT: External Ears, Nose appear normal. Grossly normal hearing.  Neck: Appears normal, supple, no cervical masses, normal ROM, no appreciable thyromegaly Respiratory: Diminished to Ausculatation bilaterally, no wheezing, rales, rhonchi or crackles.  Cardiovascular: RRR, no murmurs / rubs / gallops. S1 and S2 auscultated. Some Edema Abdomen: Soft, non-tender, non-distended. No masses palpated. No appreciable hepatosplenomegaly. Bowel sounds positive x4.  GU: Deferred. Musculoskeletal: No clubbing / cyanosis of digits/nails. No joint deformity upper and lower extremities. Wearing Una Boots and legs in ace bandages with less edema Skin: No rashes, lesions, ulcers. No induration on limited skin eval; Warm and dry.  Neurologic: CN 2-12 grossly intact with no focal deficits. Sensation intact in all 4 Extremities. Romberg sign cerebellar reflexes not assessed.  Psychiatric: Impaired judgment and insight. Alert and awake. Agitated Mood and Affect.   Data Reviewed: I have personally reviewed following labs and imaging studies  CBC:  Recent Labs Lab 09/12/16 0350 09/13/16 0339 09/14/16 0325 09/15/16 0335 09/16/16 0548  WBC 13.3* 9.8 7.7 7.1 8.5  NEUTROABS 6.2 5.2 3.6 3.2 4.3  HGB 7.0* 7.4* 7.5* 8.0* 7.9*  HCT 23.0* 23.7* 24.6* 26.2* 26.0*  MCV 77.7* 76.7* 76.9* 77.3* 77.4*  PLT 106* 140* 179 189 [id number]   Basic Metabolic Panel:  Recent Labs Lab 09/12/16 0350 09/13/16 0339 09/14/16 0325 09/15/16 0335 09/16/16 0548  NA 136 137 137 138 138  K 5.1 4.8 4.8 4.7 4.5  CL 104 105 104 103 103  CO2 23 23 23 25 25   GLUCOSE 155* 137* 122* 152* 143*  BUN 65* 67* 63* 64* 56*  CREATININE 4.07* 3.97* 3.96* 3.47* 3.18*  CALCIUM  8.0* 8.1* 8.1* 8.2* 8.1*  MG 1.6* 1.5* 1.5* 1.5* 1.5*  PHOS 5.8* 5.9* 5.7* 5.6* 4.7*   GFR: Estimated Creatinine Clearance: 23.5 mL/min (by C-G formula based on SCr of 3.18 mg/dL (H)). Liver Function Tests:  Recent Labs Lab 09/12/16 0350 09/13/16 0339 09/14/16 0325 09/15/16 0335 09/16/16 0548  AST 7*  --  10* 10* 10*  ALT 9*  --  12* 11* 9*  ALKPHOS 51  --  55 50 51  BILITOT 0.7  --  0.7 0.7 0.6  PROT 6.0*  --  6.2* 6.7 6.4*  ALBUMIN 2.7* 2.8* 2.7* 2.7* 2.7*   No results for input(s): LIPASE, AMYLASE in the last 168 hours. No results for input(s): AMMONIA in the last 168 hours. Coagulation Profile: No results for input(s): INR, PROTIME in the last 168 hours.  Cardiac Enzymes:  Recent Labs Lab 09/09/16 1728 09/10/16 0820  TROPONINI <0.03 <0.03   BNP (last 3 results) No results for input(s): PROBNP in the last 8760 hours. HbA1C: No results for input(s): HGBA1C in the last 72 hours. CBG:  Recent Labs Lab 09/14/16 2130 09/15/16 0754 09/15/16 1621 09/15/16 2248 09/16/16 0733  GLUCAP 171* 158* 215* 169* 150*   Lipid Profile: No results for input(s): CHOL, HDL, LDLCALC, TRIG, CHOLHDL, LDLDIRECT in the last 72 hours. Thyroid Function Tests: No results for input(s): TSH, T4TOTAL, FREET4, T3FREE, THYROIDAB in the last 72 hours. Anemia Panel: No results for input(s): VITAMINB12, FOLATE, FERRITIN, TIBC, IRON, RETICCTPCT in the last 72 hours. Sepsis Labs:  Recent Labs Lab 09/10/16 1608  LATICACIDVEN 1.5    Recent Results (from the past 240 hour(s))  Culture, blood (routine x 2)     Status: None   Collection Time: 09/10/16  1:30 PM  Result Value Ref Range Status   Specimen Description BLOOD RIGHT ARM  Final   Special Requests BOTTLES DRAWN AEROBIC AND ANAEROBIC 10CC  Final   Culture   Final    NO GROWTH 5 DAYS Performed at [facility], [place] [address] [place], [place] [address]    Report Status 09/15/2016 FINAL  Final  Culture, blood (routine x 2)      Status: None   Collection Time: 09/10/16  3:14 PM  Result Value Ref Range Status   Specimen Description RIGHT ANTECUBITAL  Final   Special Requests BOTTLES DRAWN AEROBIC AND ANAEROBIC 10CC  Final   Culture   Final    NO GROWTH 5 DAYS Performed at [facility], [place] [address], [place], [place] [address]    Report Status 09/15/2016 FINAL  Final  MRSA PCR Screening     Status: None   Collection Time: 09/10/16  6:00 PM  Result Value Ref Range Status   MRSA by PCR NEGATIVE NEGATIVE Final    Comment:        The GeneXpert MRSA Assay (FDA approved for NASAL specimens only), is one component of a comprehensive MRSA colonization surveillance program. It is not intended to diagnose MRSA infection nor to guide or monitor treatment for MRSA infections.     Radiology Studies: No results found. Scheduled Meds: . apixaban  2.5 mg Oral BID  . atorvastatin  20 mg Oral q1800  . darbepoetin (ARANESP) injection - NON-DIALYSIS  150 mcg Subcutaneous Q Thu-1800  . furosemide  120 mg Intravenous Q12H  . hydrocerin  1 application Topical Once per day on Tue Fri  . insulin aspart  0-5 Units Subcutaneous QHS  . insulin aspart  0-9 Units Subcutaneous TID WC  . iopamidol  30 mL Oral Once  . nystatin  1 g Topical BID  . polyethylene glycol  17 g Oral Daily  . sodium chloride flush  3 mL Intravenous Q12H   Continuous Infusions:   LOS: 11 days   [name] Elbe, DO [facility] Pager [phone]  If 7PM-7AM, please contact night-coverage [email or web address] Password [facility] 09/16/2016, 7:42 AM

## 2016-09-16 NOTE — Clinical Social Work Placement (Signed)
CSW continues to follow for discharge planning - has a bed at [facility] pending [employer] authorization, Rollene Fare at [facility] to re-start authorization today.    Raynaldo Opitz, [facility] Clinical Social Worker cell #: [phone]    CLINICAL SOCIAL WORK PLACEMENT  NOTE  Date:  09/16/2016  Patient Details  Name: [name] MRN: [id number] Date of Birth: Jan 20, 1934  Clinical Social Work is seeking post-discharge placement for this patient at the [place] level of care (*CSW will initial, date and re-position this form in  chart as items are completed):  Yes   Patient/family provided with [facility] Work Department's list of facilities offering this level of care within the geographic area requested by the patient (or if unable, by the patient's family).  Yes   Patient/family informed of their freedom to choose among providers that offer the needed level of care, that participate in Medicare, Medicaid or managed care program needed by the patient, have an available bed and are willing to accept the patient.  Yes   Patient/family informed of [facility]'s ownership interest in [facility] and [facility], as well as of the fact that they are under no obligation to receive care at these facilities.  PASRR submitted to EDS on 09/10/16     PASRR number received on 09/10/16     Existing PASRR number confirmed on       FL2 transmitted to all facilities in geographic area requested by pt/family on 09/10/16     FL2 transmitted to all facilities within larger geographic area on       Patient informed that his/her managed care company has contracts with or will negotiate with certain facilities, including the following:        Yes   Patient/family informed of bed offers received.  Patient chooses bed at [facility]     Physician recommends and patient chooses bed at      Patient to be transferred to [facility] on  .  Patient to be transferred to facility by       Patient family notified on   of transfer.  Name of family member notified:        PHYSICIAN       Additional Comment:    _______________________________________________ Standley Brooking, LCSW 09/16/2016, 12:17 PM

## 2016-09-17 LAB — COMPREHENSIVE METABOLIC PANEL
ALT: 10 U/L — ABNORMAL LOW (ref 17–63)
ANION GAP: 10 (ref 5–15)
AST: 12 U/L — ABNORMAL LOW (ref 15–41)
Albumin: 3.1 g/dL — ABNORMAL LOW (ref 3.5–5.0)
Alkaline Phosphatase: 55 U/L (ref 38–126)
BUN: 52 mg/dL — ABNORMAL HIGH (ref 6–20)
CO2: 27 mmol/L (ref 22–32)
Calcium: 8.2 mg/dL — ABNORMAL LOW (ref 8.9–10.3)
Chloride: 101 mmol/L (ref 101–111)
Creatinine, Ser: 3.3 mg/dL — ABNORMAL HIGH (ref 0.61–1.24)
GFR, EST AFRICAN AMERICAN: 19 mL/min — AB (ref >60)
GFR, EST NON AFRICAN AMERICAN: 16 mL/min — AB (ref >60)
Glucose, Bld: 140 mg/dL — ABNORMAL HIGH (ref 65–99)
Potassium: 4.6 mmol/L (ref 3.5–5.1)
SODIUM: 138 mmol/L (ref 135–145)
TOTAL PROTEIN: 6.5 g/dL (ref 6.5–8.1)
Total Bilirubin: 0.6 mg/dL (ref 0.3–1.2)

## 2016-09-17 LAB — CBC WITH DIFFERENTIAL/PLATELET
Basophils Absolute: 0 10*3/uL (ref 0.0–0.1)
Basophils Relative: 0 %
EOS ABS: 0.1 10*3/uL (ref 0.0–0.7)
EOS PCT: 1 %
HCT: 26.8 % — ABNORMAL LOW (ref 39.0–52.0)
HEMOGLOBIN: 8.2 g/dL — AB (ref 13.0–17.0)
Lymphocytes Relative: 26 %
Lymphs Abs: 2 10*3/uL (ref 0.7–4.0)
MCH: 23.8 pg — AB (ref 26.0–34.0)
MCHC: 30.6 g/dL (ref 30.0–36.0)
MCV: 77.9 fL — AB (ref 78.0–100.0)
MONOS PCT: 21 %
Monocytes Absolute: 1.6 10*3/uL — ABNORMAL HIGH (ref 0.1–1.0)
NEUTROS PCT: 52 %
Neutro Abs: 4.1 10*3/uL (ref 1.7–7.7)
Platelets: 215 10*3/uL (ref 150–400)
RBC: 3.44 MIL/uL — ABNORMAL LOW (ref 4.22–5.81)
RDW: 16.8 % — ABNORMAL HIGH (ref 11.5–15.5)
WBC: 7.8 10*3/uL (ref 4.0–10.5)

## 2016-09-17 LAB — GLUCOSE, CAPILLARY
GLUCOSE-CAPILLARY: 178 mg/dL — AB (ref 65–99)
GLUCOSE-CAPILLARY: 146 mg/dL — AB (ref 65–99)
Glucose-Capillary: 169 mg/dL — ABNORMAL HIGH (ref 65–99)
Glucose-Capillary: 143 mg/dL — ABNORMAL HIGH (ref 65–99)

## 2016-09-17 LAB — PHOSPHORUS: PHOSPHORUS: 4.1 mg/dL (ref 2.5–4.6)

## 2016-09-17 LAB — MAGNESIUM: Magnesium: 1.4 mg/dL — ABNORMAL LOW (ref 1.7–2.4)

## 2016-09-17 MED ORDER — HYDRALAZINE HCL 50 MG PO TABS
75.0000 mg | ORAL_TABLET | Freq: Three times a day (TID) | ORAL | Status: DC
  Administered 2016-09-17 – 2016-09-18 (×4): 75 mg via ORAL
  Filled 2016-09-17 (×4): qty 1

## 2016-09-17 NOTE — Progress Notes (Signed)
Physical Therapy Treatment Patient Details Name: [name] MRN: [id number] DOB: [birth year] Today's Date: 09/17/2016    History of Present Illness 83 y.o. male with PMH significant for chronic diastolic HF, CKD stage, 3, obesity, LE lymphedema, CAD, HTN, atrial fibrillation (on eliquis), HLD,  and diabetes and admitted for acute on chronic diastolic heart failure    PT Comments    Pt slow to respond/groggy/sleepy.  Required MAX encouragement to get OOB.  Pt sat EOB at Supervision level increased time to increase alertness.  Assisted to [facility] then amb a limited distance.   Pt will need ST Rehab at SNF to regain prior level of mobility.    Follow Up Recommendations  SNF     Equipment Recommendations       Recommendations for Other Services       Precautions / Restrictions Precautions Precautions: Fall Precaution Comments:  bil Unna boots Restrictions Weight Bearing Restrictions: No Other Position/Activity Restrictions: WBAT    Mobility  Bed Mobility Overal bed mobility: Needs Assistance Bed Mobility: Supine to Sit     Supine to sit: Max assist     General bed mobility comments: assist for upper and lower body required today, pt reports increased weakness     Sat EOC x 7 min for a wash up and gown change  Transfers Overall transfer level: Needs assistance Equipment used: Rolling walker (2 wheeled) Transfers: Sit to/from Stand Sit to Stand: Mod assist;+2 physical assistance;From elevated surface Stand pivot transfers: Mod assist       General transfer comment: 50% VC's on proper hand placement.  Assisted from elevated bed to [facility] then off BSC to amb  Ambulation/Gait Ambulation/Gait assistance: Min assist;Mod assist Ambulation Distance (Feet): 8 Feet Assistive device: Rolling walker (2 wheeled) Gait Pattern/deviations: Step-to pattern;Shuffle;Decreased step length - right;Decreased step length - left Gait velocity: decreased   General Gait Details: tolerated  increased distance (slight) but still required recliner to follow.  Weak   Stairs            Wheelchair Mobility    Modified Rankin (Stroke Patients Only)       Balance                                    Cognition Arousal/Alertness: Awake/alert Behavior During Therapy: WFL for tasks assessed/performed Overall Cognitive Status: Within Functional Limits for tasks assessed                 General Comments: required MAX encouragement to participate but cooperative once EOB and washing face    Exercises      General Comments        Pertinent Vitals/Pain Pain Assessment: No/denies pain    Home Living                      Prior Function            PT Goals (current goals can now be found in the care plan section) Progress towards PT goals: Progressing toward goals    Frequency    Min 3X/week      PT Plan Current plan remains appropriate    Co-evaluation             End of Session Equipment Utilized During Treatment: Gait belt Activity Tolerance: Patient tolerated treatment well Patient left: in chair;with call bell/phone within reach;with chair alarm set;with nursing/sitter in room  Time: [id number] PT Time Calculation (min) (ACUTE ONLY): 39 min  Charges:  $Gait Training: 8-22 mins $Therapeutic Activity: 23-37 mins                    G Codes:      Rica Koyanagi  PTA [facility]  Acute  Rehab Pager      [phone]

## 2016-09-17 NOTE — Consult Note (Signed)
WOC Nurse Consult:  Wound Wound type:Chronic lymphedema with skin changes.  Unna's Boots are changed twice weekly on Tuesdays and Fridays. Measurement: New linear wound in a crevice on the posterior left LE measuring 3cm x 0.2cm x 0.2cm Wound [id number], moist Drainage (amount, consistency, odor) serosanguinous Periwound:Intact, very dry Dressing procedure/placement/frequency:New Unna's Boot being placed by ortho tech, silicone foam dressing placed over the newly observed wound. Continue twice weekly treatments. [facility] nursing team will not follow, but will remain available to this patient, the nursing and medical teams.  Please re-consult if needed. Thanks, Maudie Flakes, MSN, RN, [place], Arther Abbott  Pager# [phone]

## 2016-09-17 NOTE — Plan of Care (Signed)
Problem: Health Behavior/Discharge Planning: Goal: Ability to manage health-related needs will improve Outcome: Not Progressing .  Problem: Activity: Goal: Risk for activity intolerance will decrease Outcome: Not Progressing .  Problem: Fluid Volume: Goal: Ability to maintain a balanced intake and output will improve Outcome: Progressing .  Problem: Nutrition: Goal: Adequate nutrition will be maintained Outcome: Progressing .  Problem: Cardiac: Goal: Ability to achieve and maintain adequate cardiopulmonary perfusion will improve UNNA boots changed today by ortho tech.

## 2016-09-17 NOTE — Progress Notes (Signed)
PROGRESS NOTE    [name]  [id number] DOB: Aug 15, 1934 DOA: 09/05/2016 PCP: [name], PA   Brief Narrative:  [name] Jeffersonis a 83 y.o.malewith PMH significant for chronic diastolic HF, CKD stage, 3, obesity, LE lymphedema, CAD, HTN, atrial fibrillation (on eliquis), HLD and diabetes with nephropathy; who presented to ED from his SNF due to SOB, worsening LE edema and inability to urinate. Patient endorses been unable to pee despite lasix for the last 14 hours or so. Also has experienced SOB and orthopnea for the last 2-3 days, along with worsening on his bilateral LE edema (now unable to pick his left up on his own or to walk properly due to swelling). He denies CP, Fever, HA's, blurred vision, abd pain, dysuria, hematuria, melena, hematochezia or any other complaints. Currently being treated for Heart Failure Exacerbation with Anasarca. Cardiology consulted and signed off. Nephrology are consulted and now signed off as diuresis is improved. Likely D/C to SNF in Am.   Assessment & Plan:   Principal Problem:   Acute on chronic diastolic congestive heart failure (HCC) Active Problems:   Lymphedema   Acute renal failure superimposed on stage 3 chronic kidney disease (HCC)   Chronic diastolic CHF (congestive heart failure) (HCC)   Anemia of chronic renal failure, stage 3 (moderate)   Uncontrolled diabetes mellitus with diabetic nephropathy, with long-term current use of insulin (HCC)   PAF (paroxysmal atrial fibrillation) (HCC)   Atrial flutter (HCC)   Bradycardia   AKI (acute kidney injury) ([place])   HTN (hypertension)   Severe obesity (BMI >= 40) (HCC)   Mobitz type 1 second degree AV block   Pericardial effusion- moderate on CT scan   Leukocytosis  Acute on Chronic diastolic heart failure -previous echocardiogram noted grade 2 diastolic dysfunction (Q000111Q) and systolic dysfunction (0000000) -Echocardiogram showed EF 55-60%, left ventricular diastolic function parameters  are normal, left atrium was moderately to severely dilated -Cardiology Repeated ECHO; Showed IVC is Dilated possibly to Volume Overload -Not sure if patient's SOB and LE edema is due to CHF exacerbation vs undiagnosed sleep apnea? -BNP on admission 557 -Chest x-ray showed small left pleural effusion with primary congestion -C/w po Diuresis of Lasix 120 mg BID -Monitor intake and output, daily weights -Currently no ACEi or ARB due to renal function -Appreciated Cardiology Recc's -Continue to monitor Volume Status  Acute renal failure superimposed on CKD, stage Iv, improving  -due to poor perfusion from CHF exacerbation and likely from decreased BP plus component of urinary retention most likely from BPH -Creatinine upon admission 3.67, baseline 2.6-2.9 -Creatinine trending down and now 3.30 -Renal US showed no hydronephrosis -Continue to Follow follow intake/output -D/C'd IV Vanc and Zosyn as serum Cr was worsening  -Nephrology Followed and placed Lasix BID to 120 mg po BID; -Nephrology does not feel like dialysis is indicated at this point; Signed off and will follow up as an outpatient -Continue to Monitor Renal Function in AM  Pericardial Effusion -Cardiology Followed and appreciated Recc's -Repeat ECHO showed it was small and no RV Diastolic Collapse;  -Cardiology does not feel it is contributing to present clinical picture -Continue to Monitor as an outpatient   Leukocytosis, improved -Improved; WBC down to 7.8 -UA and CXR were unremarkable for infection -repeat UA Negative, CXR showed CHF -Blood cultures x2 showed NGTD at 5 days -Currently afebrile -Was on IV Vanc and IV Zosyn but Discontinued now as not showing Signs/Symptoms of infection -Abd xray showed mild prominent air-filled loops of small and  large bowel, mild adynamic ileus cannot be excluded.  -CT of Chest showed Moderate Pericardial Effusion with ?pericarditis; Volume Overload with Marked Anasarca, Moderate B/L  Pleural Effusions and Small Ascites Noted. Multiple Segment Atelectacasis noded -Continue to Monitor and Repeat CBC in AM  Hyperkalemia -Resolved; K+ is now 4.6 -C/w po Diuresis -Repeat BMP in AM  Anemia of chronic renal failure -Hgb 8.2 -no signs of acute bleeding -will monitor trend and if Hgb less than 7 will need transfusion  -FOBT x3 pending  -Nephrology started Aranesep 150 mcg sq every Thursday -Continue to monitor CBC (may improve with diuresis)  Diabetes mellitus, type II with nephropathy  -Continue insulin SSI, CBG monitoring; Last CBG's have ranged from 150-201 -Lantus and glipizide held -Hemoglobin A1c 6.4  Paroxysmal Atrial fibrillation now A Flutter with 3:1 [place]  -Per Cardiology would Avoid BB and AV Nodal Blocking Agents  -Per Cardiology no indication of Pacemaker at this point  -Continue eliquis 2.5 mg po BID -CHADSVASC at least 4 (given age, h/o dCHF, DM, HTN) -Cardiology Followed and appreciated Recc's; Follow up with Cardiology as an outpatient.   Essential Hypertension -Added PRN HYDRALAZINE; Added Home Hydralazine back  -BP Meds held including Coreg, Imdur, and Amlodipine -Changed to po Lasix by Nephrology  CAD/Elevated troponin -no CP; but SOB could be equivalent to Angina -troponin peaked at 0.04, Currently flat and less than <0.03; unlikely ACS.  -Possibly secondary to demand. -Echocardiogram as listed above -Continue coreg, lipitor and Imdur, apixaban -Cardiology Following  Hyperlipidemia -Continue Statin  Acute urinary retention -as mentioned above; foley catheter placed while in ED -Flomax discontinued by Nephrology; Follow up as outpatient   Severe obesity  -Body mass index is 38.67 kg/m. -Will need to follow up with PCP upon discharge to discuss lifestyle modifications -Nutrition consulted -patient should have a sleep study as an outpateint  Deconditioning -Not sure where patient is from. H&P noted SNF, social work/case  management noted Hotel.  -PT and OT consulted, recommended SNF once stable for D/C -Will likely be D/C'd in AM once Insurance Authorization comes through  Lower extremity edema -Unna boots in place, wound care consulted -Diuresis as above with po Lasix  Constipation -Improved -Continue bowel regimen  DVT prophylaxis: Anticoagulated with Apixaban Code Status: FULL CODE Family Communication: No Family present at bedside Disposition Plan: SNF in AM  Consultants:   Nephrology  Cardiology  Procedures: Echocardiogram; Renal Ultrasound  Antimicrobials:  Anti-infectives    Start     Dose/Rate Route Frequency Ordered Stop   09/12/16 1600  vancomycin (VANCOCIN) 1,500 mg in sodium chloride 0.9 % 500 mL IVPB  Status:  Discontinued     1,500 mg 250 mL/hr over 120 Minutes Intravenous Every 48 hours 09/10/16 1550 09/12/16 1848   09/10/16 1600  vancomycin (VANCOCIN) 2,000 mg in sodium chloride 0.9 % 500 mL IVPB     2,000 mg 250 mL/hr over 120 Minutes Intravenous  Once 09/10/16 1549 09/10/16 1959   09/10/16 1600  piperacillin-tazobactam (ZOSYN) IVPB 2.25 g  Status:  Discontinued     2.25 g 100 mL/hr over 30 Minutes Intravenous Every 8 hours 09/10/16 1549 09/12/16 1848     Subjective: Seen and at bedside and stated he felt great this Am. WOC to come evaluate legs today. No N/V. States swelling is improved. Was laughing with Pt.   Objective: Vitals:   09/16/16 1315 09/16/16 2003 09/17/16 0533 09/17/16 1445  BP: (!) 155/50 (!) 143/47 (!) 161/50 (!) 175/60  Pulse:  (!) 57 60 (!)  50  Resp:  19 18 19   Temp:  97.7 F (36.5 C) 98.1 F (36.7 C) 98.1 F (36.7 C)  TempSrc:  Oral Oral Oral  SpO2:  100% 98% 100%  Weight:   100.5 kg (221 lb 9 oz)   Height:        Intake/Output Summary (Last 24 hours) at 09/17/16 2013 Last data filed at 09/17/16 1727  Gross per 24 hour  Intake              503 ml  Output             2600 ml  Net            -2097 ml   Filed Weights   09/15/16 0600  09/16/16 0812 09/17/16 0533  Weight: 113.3 kg (249 lb 12.5 oz) 112.1 kg (247 lb 1.6 oz) 100.5 kg (221 lb 9 oz)   Examination: Physical Exam:  Constitutional: WN/WD, NAD appears agitated and does not want to participate  Eyes: Lids and conjunctivae normal, sclerae anicteric  ENMT: External Ears, Nose appear normal. Grossly normal hearing.  Neck: Appears normal, supple, no cervical masses, normal ROM, no appreciable thyromegaly Respiratory: Diminished to Ausculatation bilaterally, no wheezing, rales, rhonchi or crackles.  Cardiovascular: RRR, no murmurs / rubs / gallops. S1 and S2 auscultated. Some Edema Abdomen: Soft, non-tender, non-distended. No masses palpated. No appreciable hepatosplenomegaly. Bowel sounds positive x4.  GU: Deferred. Musculoskeletal: No clubbing / cyanosis of digits/nails. No joint deformity upper and lower extremities. Wearing Una Boots and legs in ace bandages with less edema; Venous stasis changes noted.  Skin: No rashes, lesions, ulcers. No induration on limited skin eval; Warm and dry.  Neurologic: CN 2-12 grossly intact with no focal deficits. Sensation intact in all 4 Extremities. Romberg sign cerebellar reflexes not assessed.  Psychiatric: Impaired judgment and insight. Alert and awake. Agitated Mood and Affect.   Data Reviewed: I have personally reviewed following labs and imaging studies  CBC:  Recent Labs Lab 09/13/16 0339 09/14/16 0325 09/15/16 0335 09/16/16 0548 09/17/16 0533  WBC 9.8 7.7 7.1 8.5 7.8  NEUTROABS 5.2 3.6 3.2 4.3 4.1  HGB 7.4* 7.5* 8.0* 7.9* 8.2*  HCT 23.7* 24.6* 26.2* 26.0* 26.8*  MCV 76.7* 76.9* 77.3* 77.4* 77.9*  PLT 140* 179 189 203 [id number]   Basic Metabolic Panel:  Recent Labs Lab 09/13/16 0339 09/14/16 0325 09/15/16 0335 09/16/16 0548 09/17/16 0533  NA 137 137 138 138 138  K 4.8 4.8 4.7 4.5 4.6  CL 105 104 103 103 101  CO2 23 23 25 25 27   GLUCOSE 137* 122* 152* 143* 140*  BUN 67* 63* 64* 56* 52*  CREATININE 3.97*  3.96* 3.47* 3.18* 3.30*  CALCIUM 8.1* 8.1* 8.2* 8.1* 8.2*  MG 1.5* 1.5* 1.5* 1.5* 1.4*  PHOS 5.9* 5.7* 5.6* 4.7* 4.1   GFR: Estimated Creatinine Clearance: 21.4 mL/min (by C-G formula based on SCr of 3.3 mg/dL (H)). Liver Function Tests:  Recent Labs Lab 09/12/16 0350 09/13/16 0339 09/14/16 0325 09/15/16 0335 09/16/16 0548 09/17/16 0533  AST 7*  --  10* 10* 10* 12*  ALT 9*  --  12* 11* 9* 10*  ALKPHOS 51  --  55 50 51 55  BILITOT 0.7  --  0.7 0.7 0.6 0.6  PROT 6.0*  --  6.2* 6.7 6.4* 6.5  ALBUMIN 2.7* 2.8* 2.7* 2.7* 2.7* 3.1*   No results for input(s): LIPASE, AMYLASE in the last 168 hours. No results for input(s): AMMONIA in the  last 168 hours. Coagulation Profile: No results for input(s): INR, PROTIME in the last 168 hours. Cardiac Enzymes: No results for input(s): CKTOTAL, CKMB, CKMBINDEX, TROPONINI in the last 168 hours. BNP (last 3 results) No results for input(s): PROBNP in the last 8760 hours. HbA1C: No results for input(s): HGBA1C in the last 72 hours. CBG:  Recent Labs Lab 09/16/16 1630 09/16/16 2113 09/17/16 0728 09/17/16 1155 09/17/16 1643  GLUCAP 177* 155* 178* 169* 143*   Lipid Profile: No results for input(s): CHOL, HDL, LDLCALC, TRIG, CHOLHDL, LDLDIRECT in the last 72 hours. Thyroid Function Tests: No results for input(s): TSH, T4TOTAL, FREET4, T3FREE, THYROIDAB in the last 72 hours. Anemia Panel: No results for input(s): VITAMINB12, FOLATE, FERRITIN, TIBC, IRON, RETICCTPCT in the last 72 hours. Sepsis Labs: No results for input(s): PROCALCITON, LATICACIDVEN in the last 168 hours.  Recent Results (from the past 240 hour(s))  Culture, blood (routine x 2)     Status: None   Collection Time: 09/10/16  1:30 PM  Result Value Ref Range Status   Specimen Description BLOOD RIGHT ARM  Final   Special Requests BOTTLES DRAWN AEROBIC AND ANAEROBIC 10CC  Final   Culture   Final    NO GROWTH 5 DAYS Performed at [facility], [place] [address],  [place], [place] [address]    Report Status 09/15/2016 FINAL  Final  Culture, blood (routine x 2)     Status: None   Collection Time: 09/10/16  3:14 PM  Result Value Ref Range Status   Specimen Description RIGHT ANTECUBITAL  Final   Special Requests BOTTLES DRAWN AEROBIC AND ANAEROBIC 10CC  Final   Culture   Final    NO GROWTH 5 DAYS Performed at [facility], [place] [address], [place], [place] [address]    Report Status 09/15/2016 FINAL  Final  MRSA PCR Screening     Status: None   Collection Time: 09/10/16  6:00 PM  Result Value Ref Range Status   MRSA by PCR NEGATIVE NEGATIVE Final    Comment:        The GeneXpert MRSA Assay (FDA approved for NASAL specimens only), is one component of a comprehensive MRSA colonization surveillance program. It is not intended to diagnose MRSA infection nor to guide or monitor treatment for MRSA infections.     Radiology Studies: No results found. Scheduled Meds: . apixaban  2.5 mg Oral BID  . atorvastatin  20 mg Oral q1800  . darbepoetin (ARANESP) injection - NON-DIALYSIS  150 mcg Subcutaneous Q Thu-1800  . furosemide  120 mg Oral BID  . hydrALAZINE  75 mg Oral Q8H  . hydrocerin  1 application Topical Once per day on Tue Fri  . insulin aspart  0-5 Units Subcutaneous QHS  . insulin aspart  0-9 Units Subcutaneous TID WC  . iopamidol  30 mL Oral Once  . nystatin  1 g Topical BID  . polyethylene glycol  17 g Oral Daily  . sodium chloride flush  3 mL Intravenous Q12H   Continuous Infusions:   LOS: 12 days   Kerney Elbe, DO [facility] Pager [phone]  If 7PM-7AM, please contact night-coverage [email or web address] Password [facility] 09/17/2016, 8:13 PM

## 2016-09-18 DIAGNOSIS — I441 Atrioventricular block, second degree: Secondary | ICD-10-CM

## 2016-09-18 DIAGNOSIS — N183 Chronic kidney disease, stage 3 (moderate): Secondary | ICD-10-CM

## 2016-09-18 DIAGNOSIS — Z794 Long term (current) use of insulin: Secondary | ICD-10-CM

## 2016-09-18 DIAGNOSIS — N179 Acute kidney failure, unspecified: Secondary | ICD-10-CM

## 2016-09-18 DIAGNOSIS — D631 Anemia in chronic kidney disease: Secondary | ICD-10-CM

## 2016-09-18 DIAGNOSIS — I890 Lymphedema, not elsewhere classified: Secondary | ICD-10-CM

## 2016-09-18 DIAGNOSIS — E1165 Type 2 diabetes mellitus with hyperglycemia: Secondary | ICD-10-CM

## 2016-09-18 DIAGNOSIS — E1121 Type 2 diabetes mellitus with diabetic nephropathy: Secondary | ICD-10-CM

## 2016-09-18 DIAGNOSIS — R001 Bradycardia, unspecified: Secondary | ICD-10-CM

## 2016-09-18 DIAGNOSIS — I480 Paroxysmal atrial fibrillation: Secondary | ICD-10-CM

## 2016-09-18 DIAGNOSIS — I483 Typical atrial flutter: Secondary | ICD-10-CM

## 2016-09-18 DIAGNOSIS — I5032 Chronic diastolic (congestive) heart failure: Secondary | ICD-10-CM

## 2016-09-18 DIAGNOSIS — L8990 Pressure ulcer of unspecified site, unspecified stage: Secondary | ICD-10-CM | POA: Insufficient documentation

## 2016-09-18 LAB — COMPREHENSIVE METABOLIC PANEL
ALT: 11 U/L — ABNORMAL LOW (ref 17–63)
AST: 14 U/L — ABNORMAL LOW (ref 15–41)
Albumin: 3 g/dL — ABNORMAL LOW (ref 3.5–5.0)
Alkaline Phosphatase: 56 U/L (ref 38–126)
Anion gap: 11 (ref 5–15)
BILIRUBIN TOTAL: 1 mg/dL (ref 0.3–1.2)
BUN: 47 mg/dL — AB (ref 6–20)
CHLORIDE: 101 mmol/L (ref 101–111)
CO2: 26 mmol/L (ref 22–32)
Calcium: 8.2 mg/dL — ABNORMAL LOW (ref 8.9–10.3)
Creatinine, Ser: 3.2 mg/dL — ABNORMAL HIGH (ref 0.61–1.24)
GFR calc Af Amer: 20 mL/min — ABNORMAL LOW (ref >60)
GFR, EST NON AFRICAN AMERICAN: 17 mL/min — AB (ref >60)
Glucose, Bld: 138 mg/dL — ABNORMAL HIGH (ref 65–99)
POTASSIUM: 4.9 mmol/L (ref 3.5–5.1)
Sodium: 138 mmol/L (ref 135–145)
TOTAL PROTEIN: 6.4 g/dL — AB (ref 6.5–8.1)

## 2016-09-18 LAB — CBC WITH DIFFERENTIAL/PLATELET
Basophils Absolute: 0 10*3/uL (ref 0.0–0.1)
Basophils Relative: 0 %
EOS PCT: 0 %
Eosinophils Absolute: 0 10*3/uL (ref 0.0–0.7)
HEMATOCRIT: 27.7 % — AB (ref 39.0–52.0)
Hemoglobin: 8.4 g/dL — ABNORMAL LOW (ref 13.0–17.0)
LYMPHS PCT: 27 %
Lymphs Abs: 2.6 10*3/uL (ref 0.7–4.0)
MCH: 23.5 pg — AB (ref 26.0–34.0)
MCHC: 30.3 g/dL (ref 30.0–36.0)
MCV: 77.6 fL — AB (ref 78.0–100.0)
MONO ABS: 1.7 10*3/uL — AB (ref 0.1–1.0)
MONOS PCT: 18 %
NEUTROS ABS: 5.1 10*3/uL (ref 1.7–7.7)
Neutrophils Relative %: 55 %
PLATELETS: 260 10*3/uL (ref 150–400)
RBC: 3.57 MIL/uL — ABNORMAL LOW (ref 4.22–5.81)
RDW: 17.2 % — AB (ref 11.5–15.5)
WBC: 9.5 10*3/uL (ref 4.0–10.5)

## 2016-09-18 LAB — MAGNESIUM: MAGNESIUM: 1.4 mg/dL — AB (ref 1.7–2.4)

## 2016-09-18 LAB — GLUCOSE, CAPILLARY
GLUCOSE-CAPILLARY: 154 mg/dL — AB (ref 65–99)
Glucose-Capillary: 153 mg/dL — ABNORMAL HIGH (ref 65–99)

## 2016-09-18 LAB — PHOSPHORUS: PHOSPHORUS: 3.9 mg/dL (ref 2.5–4.6)

## 2016-09-18 MED ORDER — INSULIN ASPART 100 UNIT/ML ~~LOC~~ SOLN: 0.0000 [IU] | Freq: Three times a day (TID) | SUBCUTANEOUS | 11 refills | Status: DC

## 2016-09-18 MED ORDER — HYDROCODONE-ACETAMINOPHEN 5-325 MG PO TABS: 1.0000 | ORAL_TABLET | Freq: Four times a day (QID) | ORAL | 0 refills | Status: DC | PRN

## 2016-09-18 MED ORDER — INSULIN ASPART 100 UNIT/ML ~~LOC~~ SOLN: 0.0000 [IU] | Freq: Every day | SUBCUTANEOUS | 11 refills | Status: DC

## 2016-09-18 MED ORDER — HYDRALAZINE HCL 25 MG PO TABS: 75.0000 mg | ORAL_TABLET | Freq: Three times a day (TID) | ORAL | 0 refills | Status: DC

## 2016-09-18 MED ORDER — DARBEPOETIN ALFA 150 MCG/0.3ML IJ SOSY: 150.0000 ug | PREFILLED_SYRINGE | INTRAMUSCULAR | 0 refills | Status: DC

## 2016-09-18 MED ORDER — FUROSEMIDE 40 MG PO TABS: 120.0000 mg | ORAL_TABLET | Freq: Two times a day (BID) | ORAL | 0 refills | Status: DC

## 2016-09-18 MED ORDER — BISACODYL 5 MG PO TBEC: 5.0000 mg | DELAYED_RELEASE_TABLET | Freq: Every day | ORAL | 0 refills | Status: DC | PRN

## 2016-09-18 NOTE — Progress Notes (Signed)
Pressure injury found between pt's buttocks during bath. Partial thickness loss of dermis assessed. See flowsheets.

## 2016-09-18 NOTE — Care Management Note (Signed)
Case Management Note  Patient Details  Name: [name] MRN: [id number] Date of Birth: [birth year]  Subjective/Objective:                    Action/Plan:d/c SNF.   Expected Discharge Date:   (UNKNOWN)               Expected Discharge Plan:  Skilled Nursing Facility  In-House Referral:  Clinical Social Work  Discharge planning Services  CM Consult  Post Acute Care Choice:    Choice offered to:     DME Arranged:    DME Agency:     HH Arranged:    [place] Agency:     Status of Service:  Completed, signed off  If discussed at [employer], dates discussed:    Additional Comments:  Dessa Phi, RN 09/18/2016, 10:52 AM

## 2016-09-18 NOTE — Clinical Social Work Placement (Signed)
Patient is set to discharge to [facility] today. Patient & friend, Coralyn Mark made aware. Discharge packet given to RN, Deidre Ala. [facility] called for transport.     Raynaldo Opitz, [facility] Clinical Social Worker cell #: [phone]    CLINICAL SOCIAL WORK PLACEMENT  NOTE  Date:  09/18/2016  Patient Details  Name: [name] MRN: [id number] Date of Birth: Dec 28, 1933  Clinical Social Work is seeking post-discharge placement for this patient at the [place] level of care (*CSW will initial, date and re-position this form in  chart as items are completed):  Yes   Patient/family provided with [place] Work Department's list of facilities offering this level of care within the geographic area requested by the patient (or if unable, by the patient's family).  Yes   Patient/family informed of their freedom to choose among providers that offer the needed level of care, that participate in Medicare, Medicaid or managed care program needed by the patient, have an available bed and are willing to accept the patient.  Yes   Patient/family informed of [facility]'s ownership interest in [facility] and [facility], as well as of the fact that they are under no obligation to receive care at these facilities.  PASRR submitted to EDS on 09/10/16     PASRR number received on 09/10/16     Existing PASRR number confirmed on       FL2 transmitted to all facilities in geographic area requested by pt/family on 09/10/16     FL2 transmitted to all facilities within larger geographic area on       Patient informed that his/her managed care company has contracts with or will negotiate with certain facilities, including the following:        Yes   Patient/family informed of bed offers received.  Patient chooses bed at [facility]     Physician recommends and patient chooses bed at      Patient to be transferred to [facility] on  09/18/16.  Patient to be transferred to facility by [facility]     Patient family notified on 09/18/16 of transfer.  Name of family member notified:  patient's friend, Coralyn Mark via phone     PHYSICIAN       Additional Comment:    _______________________________________________ Standley Brooking, LCSW 09/18/2016, 1:46 PM

## 2016-09-18 NOTE — Discharge Summary (Signed)
Physician Discharge Summary  [name] [id number] DOB: Jan 09, 1934 DOA: 09/05/2016  PCP: [name], PA  Admit date: 09/05/2016 Discharge date: 09/18/2016  Admitted From: Home Disposition:  SNF- [facility]  Recommendations for Outpatient Follow-up:  1. Follow up with PCP in 1-2 weeks 2. Please obtain BMP/CBC in 3days 3. Follow up with [facility] outpatient 4. Attempt voiding trial tomorrow per facility protocol 5. Wound care per facility protocol 6. Discussed sleep study with PCP at first outpatient appointment 7. Continue anemia injection weekly 8. Daily weights  Home Health: No Equipment/Devices: None  Discharge Condition:Stable CODE STATUS: Full code Diet recommendation: Heart Healthy with 155ml fluid restriction  Brief/Interim Summary: [name] a 83 y.o.malewith PMH significant for chronic diastolic HF, CKD stage, 3, obesity, LE lymphedema, CAD, HTN, atrial fibrillation (on eliquis), HLD and diabetes with nephropathy; who presented to ED from his SNF due to SOB, worsening LE edema and inability to urinate. Patient endorses been unable to pee despite lasix for the last 14 hours or so. Also has experienced SOB and orthopnea for the last 2-3 days, along with worsening on his bilateral LE edema (now unable to pick his left up on his own or to walk properly due to swelling). He denies CP, Fever, HA's, blurred vision, abd pain, dysuria, hematuria, melena, hematochezia or any other complaints. Currently being treated for Heart Failure Exacerbation with Anasarca. Cardiology consulted and signed off. Nephrology are consulted and now signed off as diuresis is improved. Likely D/C to SNF in Am.  Discharge Diagnoses:  Principal Problem:   Acute on chronic diastolic congestive heart failure (HCC) Active Problems:   Lymphedema   Acute renal failure superimposed on stage 3 chronic kidney disease (HCC)   Chronic diastolic CHF (congestive heart  failure) (HCC)   Anemia of chronic renal failure, stage 3 (moderate)   Uncontrolled diabetes mellitus with diabetic nephropathy, with long-term current use of insulin (HCC)   PAF (paroxysmal atrial fibrillation) (HCC)   Atrial flutter (HCC)   Bradycardia   AKI (acute kidney injury) ([place])   HTN (hypertension)   Severe obesity (BMI >= 40) (HCC)   Mobitz type 1 second degree AV block   Pericardial effusion- moderate on CT scan   Leukocytosis   Pressure injury of skin  Acute on Chronic diastolic heart failure -previous echocardiogram noted grade 2 diastolic dysfunction (Q000111Q) and systolic dysfunction (0000000) -Echocardiogram showed EF 55-60%, left ventricular diastolic function parameters are normal, left atrium was moderately to severely dilated -Cardiology Repeated ECHO; Showed IVC is Dilated possibly to Volume Overload -Not sure if patient's SOB and LE edema is due to CHF exacerbation vs undiagnosed sleep apnea? -BNP on admission 557 -Chest x-ray showed small left pleural effusion with primary congestion -C/w po Diuresis of Lasix 120 mg BID -Monitor intake and output, daily weights -Currently no ACEi or ARB due to renal function -Appreciated Cardiology Recc's -Continue to monitor Volume Status  Acute renal failure superimposed on CKD, stage Iv, improving  -due to poor perfusion from CHF exacerbation and likely from decreased BP plus component of urinary retention most likely from BPH -Creatinine upon admission 3.67, baseline 2.6-2.9 -Creatinine trending down and now 3.30 -Renal US showed no hydronephrosis -Continue to Follow follow intake/output  -D/C'd IV Vanc and Zosyn as serum Cr was worsening  -Nephrology Followed and placed Lasix BID to 120 mg po BID; -Nephrology does not feel like dialysis is indicated at this point; Signed off and will follow up as an outpatient - Repeat  BMP in 3 days  Pericardial Effusion -Cardiology Followed and appreciated Recc's -Repeat ECHO showed  it was small and no RV Diastolic Collapse;  -Cardiology does not feel it is contributing to present clinical picture -Continue to Monitor as an outpatient   Leukocytosis, improved -Improved; WBC down to 7.8 -UA and CXR were unremarkable for infection -repeat UA Negative, CXR showed CHF -Blood cultures x2 showed NGTD at 5 days -Currently afebrile -Was on IV Vanc and IV Zosyn but Discontinued now as not showing Signs/Symptoms of infection -Abd xray showed mild prominent air-filled loops of small and large bowel, mild adynamic ileus cannot be excluded.  -CT of Chest showed Moderate Pericardial Effusion with ?pericarditis; Volume Overload with Marked Anasarca, Moderate B/L Pleural Effusions and Small Ascites Noted. Multiple Segment Atelectacasis noded -Continue to Monitor and Repeat CBC in AM  Hyperkalemia -Resolved; K+ is now 4.9 -C/w po Diuresis -Repeat BMP in 3 days  Anemia of chronic renal failure -Hgb 8.2 -no signs of acute bleeding -will monitor trend and if Hgb less than 7 will need transfusion  -FOBT x3 pending  -Nephrology started Aranesep 150 mcg sq every Thursday -Continue to monitor CBC (may improve with diuresis)  Diabetes mellitus, type II with nephropathy  -Continue insulin SSI, CBG monitoring; Last CBG's have ranged from 150-201 -Lantus and glipizide held -Hemoglobin A1c 6.4  Paroxysmal Atrial fibrillation now A Flutter with 3:1 [place]  -Per Cardiology would Avoid BB and AV Nodal Blocking Agents  -Per Cardiology no indication of Pacemaker at this point  -Continue eliquis 2.5 mg po BID -CHADSVASC at least 4 (given age, h/o dCHF, DM, HTN) -Cardiology Followed and appreciated Recc's; Follow up with Cardiology as an outpatient.   Essential Hypertension -Added PRN HYDRALAZINE; Added Home Hydralazine back  -BP Meds held including Coreg, Imdur, and Amlodipine (Coreg and Imdur discontinued) -Changed to po Lasix by Nephrology  CAD/Elevated troponin -no CP; but  SOB could be equivalent to Angina -troponin peaked at 0.04, Currently flat and less than <0.03; unlikely ACS.  -Possibly secondary to demand -Echocardiogram as listed above -Continue coreg, lipitor and Imdur, apixaban -Cardiology outpatient follow up  Hyperlipidemia -Continue Statin  Acute urinary retention -as mentioned above; foley catheter placed while in ED -Flomax discontinued by Nephrology; Follow up as outpatient  - will try voiding trial tomorrow at facility  Severe obesity  -Body mass index is 38.67 kg/m. -Will need to follow up with PCP upon discharge to discuss lifestyle modifications -Nutrition consulted -patient should have a sleep study as an outpateint  Deconditioning -Not sure where patient is from. H&P noted SNF, social work/case management noted [facility].  -PT and OT consulted- patient to d/c to SNF  Lower extremity edema -Unna boots in place, wound care consulted -Diuresis as above with po Lasix  Constipation -Improved -Continue bowel regimen  Discharge Instructions  Discharge Instructions    (HEART FAILURE PATIENTS) Call MD:  Anytime you have any of the following symptoms: 1) 3 pound weight gain in 24 hours or 5 pounds in 1 week 2) shortness of breath, with or without a dry hacking cough 3) swelling in the hands, feet or stomach 4) if you have to sleep on extra pillows at night in order to breathe.    Complete by:  As directed    Call MD for:  difficulty breathing, headache or visual disturbances    Complete by:  As directed    Call MD for:  extreme fatigue    Complete by:  As directed  Call MD for:  hives    Complete by:  As directed    Call MD for:  persistant dizziness or light-headedness    Complete by:  As directed    Call MD for:  persistant nausea and vomiting    Complete by:  As directed    Call MD for:  severe uncontrolled pain    Complete by:  As directed    Call MD for:  temperature >100.4    Complete by:  As directed    Diet -  low sodium heart healthy    Complete by:  As directed    Increase activity slowly    Complete by:  As directed      Allergies as of 09/18/2016      Reactions   Metformin And Related    Reaction: pt states he thinks he threw up      Medication List    STOP taking these medications   albuterol (2.5 MG/3ML) 0.083% nebulizer solution Commonly known as:  PROVENTIL   amLODipine 10 MG tablet Commonly known as:  NORVASC   amoxicillin-clavulanate 875-125 MG tablet Commonly known as:  AUGMENTIN   carvedilol 12.5 MG tablet Commonly known as:  COREG   glimepiride 2 MG tablet Commonly known as:  AMARYL   insulin glargine 100 UNIT/ML injection Commonly known as:  LANTUS   isosorbide mononitrate 60 MG 24 hr tablet Commonly known as:  IMDUR   oxyCODONE-acetaminophen 5-325 MG tablet Commonly known as:  PERCOCET/ROXICET   polyethylene glycol packet Commonly known as:  MIRALAX   senna-docusate 8.6-50 MG tablet Commonly known as:  Senokot-S     TAKE these medications   apixaban 2.5 MG Tabs tablet Commonly known as:  ELIQUIS Take 1 tablet (2.5 mg total) by mouth 2 (two) times daily.   atorvastatin 20 MG tablet Commonly known as:  LIPITOR Take 1 tablet (20 mg total) by mouth daily at 6 PM.   bisacodyl 5 MG EC tablet Commonly known as:  DULCOLAX Take 1 tablet (5 mg total) by mouth daily as needed for moderate constipation.   Darbepoetin Alfa 150 MCG/0.3ML Sosy injection Commonly known as:  ARANESP Inject 0.3 mLs (150 mcg total) into the skin every 7 (seven) days.   furosemide 40 MG tablet Commonly known as:  LASIX Take 3 tablets (120 mg total) by mouth 2 (two) times daily. What changed:  how much to take  when to take this   hydrALAZINE 25 MG tablet Commonly known as:  APRESOLINE Take 3 tablets (75 mg total) by mouth every 8 (eight) hours. What changed:  medication strength  how much to take   HYDROcodone-acetaminophen 5-325 MG tablet Commonly known as:   NORCO/VICODIN Take 1-2 tablets by mouth every 6 (six) hours as needed for moderate pain or severe pain.   insulin aspart 100 UNIT/ML injection Commonly known as:  novoLOG Inject 0-9 Units into the skin 3 (three) times daily with meals. What changed:  additional instructions   insulin aspart 100 UNIT/ML injection Commonly known as:  novoLOG Inject 0-5 Units into the skin at bedtime. What changed:  You were already taking a medication with the same name, and this prescription was added. Make sure you understand how and when to take each.   nystatin powder Commonly known as:  MYCOSTATIN/NYSTOP Apply 1 g topically 2 (two) times daily. For 14 days      Follow-up Information    [name], [place]. Schedule an appointment as soon as possible for a  visit in 1 week(s).   Specialty:  Internal Medicine Contact information: [address] [place] [address] [phone]        Skeet Latch, MD. Schedule an appointment as soon as possible for a visit in 2 week(s).   Specialty:  Cardiology Contact information: [address] [place] [address] [phone]        Sol Blazing, MD. Schedule an appointment as soon as possible for a visit in 3 week(s).   Specialty:  Nephrology Contact information: [place] [address] [phone]          Allergies  Allergen Reactions  . Metformin And Related     Reaction: pt states he thinks he threw up    Consultations:  Cardiology  Nephrology  PT/ OT  SW   Procedures/Studies: Ct Abdomen Pelvis Wo Contrast  Result Date: 09/10/2016 CLINICAL DATA:  CHF.  Leukocytosis. EXAM: CT CHEST, ABDOMEN AND PELVIS WITHOUT CONTRAST TECHNIQUE: Multidetector CT imaging of the chest, abdomen and pelvis was performed following the standard protocol without IV contrast. COMPARISON:  Chest CT 08/29/2015 FINDINGS: CT CHEST FINDINGS Cardiovascular: Chronic cardiomegaly. There is a moderate pericardial  effusion that is new from echocardiogram report 09/06/2016. Some of the fat around the effusion is infiltrated and pericarditis is considered. No convincing ventricular wall or septal distortion from mass-effect. Mediastinum/Nodes: Diffuse atherosclerotic calcification. No noncontrast evidence of acute vascular disease. Lungs/Pleura: Moderate layering pleural effusions, larger on the left. Multi segment atelectasis. Musculoskeletal: No acute or aggressive finding. Exaggerated thoracic kyphosis with spondylosis with multi-level ankylosis. CT ABDOMEN PELVIS FINDINGS Hepatobiliary: No focal liver abnormality.Extensive calcification in the gallbladder fossa. This is likely luminal and from large gallstone rather than mural calcification. No common bile duct dilatation. Pancreas: Unremarkable. Spleen: Unremarkable. Adrenals/Urinary Tract: Negative adrenals. Smooth bilateral renal atrophy. No hydronephrosis. Cortical and hilar cyst at the left inferior pole. The bladder is decompressed around a Foley catheter. Stomach/Bowel:  No obstruction. Status post right hemicolectomy Vascular/Lymphatic: Diffuse atherosclerotic calcification. No mass or adenopathy. Reproductive:Symmetric prostate enlargement. Other: Small non loculated ascites and Extensive body wall edema. Small inguinal hernias, fatty on the left and ascitic on the right. Musculoskeletal: No acute abnormalities. Diffuse degenerative change. IMPRESSION: 1. Moderate pericardial effusion not noted on recent echocardiography. Neighboring fat is reticulated and pericarditis should be considered. 2. Volume overload with marked anasarca, moderate bilateral pleural effusion, and small ascites. 3. Multi segment atelectasis. Electronically Signed   By: Monte Fantasia M.D.   On: 09/10/2016 16:53   Dg Chest 2 View  Result Date: 09/10/2016 CLINICAL DATA:  Leukocytosis. EXAM: CHEST  2 VIEW COMPARISON:  09/09/2016. FINDINGS: Cardiomegaly with pulmonary vascular prominence  and bilateral pulmonary infiltrates. Bilateral pleural effusions. Findings consistent congestive heart failure. Bilateral pneumonia cannot be excluded. Findings progressed from 09/09/2016. IMPRESSION: Congestive heart failure with bilateral pulmonary edema and bilateral pleural effusions, progressed from prior exam. Bilateral pneumonia cannot be excluded. Electronically Signed   By: Marcello Moores  Register   On: 09/10/2016 14:48   Dg Chest 2 View  Result Date: 09/09/2016 CLINICAL DATA:  Shortness of breath. EXAM: CHEST  2 VIEW COMPARISON:  09/05/2016. FINDINGS: Cardiomegaly with pulmonary vascular prominence and bilateral interstitial prominence with bilateral pleural effusions. Findings consistent with congestive heart failure. No pneumothorax. IMPRESSION: Congestive heart failure with bilateral pulmonary interstitial edema and bilateral pleural effusions. Similar findings noted on prior exam. Electronically Signed   By: [place]   On: 09/09/2016 09:27   Dg Chest 2 View  Result Date: 09/05/2016 CLINICAL DATA:  Shortness of Breath EXAM: CHEST  2 VIEW COMPARISON:  July 22, 2016 FINDINGS: There is a left pleural effusion with left base atelectasis. Lungs elsewhere clear. There is cardiomegaly with pulmonary venous hypertension. There is atherosclerotic calcification in the aorta. No adenopathy. There is degenerative change in each shoulder. IMPRESSION: Small left pleural effusion with left base atelectasis. Lungs elsewhere clear. There is underlying pulmonary vascular congestion. There is aortic atherosclerosis. Electronically Signed   By: Lowella Grip III M.D.   On: 09/05/2016 11:07   Dg Abd 1 View  Result Date: 09/10/2016 CLINICAL DATA:  Leukocytosis. EXAM: ABDOMEN - 1 VIEW COMPARISON:  Ultrasound 09/06/2016.  Ultrasound 11/24/2014. FINDINGS: Soft tissue structures are unremarkable. Surgical sutures noted over the right abdomen. Rounded calcification right upper quadrant consistent with  cholelithiasis. Mildly prominent air -filled loops of small and large bowel noted. Adynamic ileus cannot be excluded. No free air. No acute bony or joint abnormality identified. Vascular calcification. IMPRESSION: 1. Mildly prominent air-filled loops of small and large bowel noted. Mild adynamic ileus cannot be excluded. Follow-up abdominal series to exclude bowel obstruction. 2. Prominent calcific density right upper quadrant most likely large gallstone. Electronically Signed   By: Marcello Moores  Register   On: 09/10/2016 14:52   Ct Chest Wo Contrast  Result Date: 09/10/2016 CLINICAL DATA:  CHF.  Leukocytosis. EXAM: CT CHEST, ABDOMEN AND PELVIS WITHOUT CONTRAST TECHNIQUE: Multidetector CT imaging of the chest, abdomen and pelvis was performed following the standard protocol without IV contrast. COMPARISON:  Chest CT 08/29/2015 FINDINGS: CT CHEST FINDINGS Cardiovascular: Chronic cardiomegaly. There is a moderate pericardial effusion that is new from echocardiogram report 09/06/2016. Some of the fat around the effusion is infiltrated and pericarditis is considered. No convincing ventricular wall or septal distortion from mass-effect. Mediastinum/Nodes: Diffuse atherosclerotic calcification. No noncontrast evidence of acute vascular disease. Lungs/Pleura: Moderate layering pleural effusions, larger on the left. Multi segment atelectasis. Musculoskeletal: No acute or aggressive finding. Exaggerated thoracic kyphosis with spondylosis with multi-level ankylosis. CT ABDOMEN PELVIS FINDINGS Hepatobiliary: No focal liver abnormality.Extensive calcification in the gallbladder fossa. This is likely luminal and from large gallstone rather than mural calcification. No common bile duct dilatation. Pancreas: Unremarkable. Spleen: Unremarkable. Adrenals/Urinary Tract: Negative adrenals. Smooth bilateral renal atrophy. No hydronephrosis. Cortical and hilar cyst at the left inferior pole. The bladder is decompressed around a Foley  catheter. Stomach/Bowel:  No obstruction. Status post right hemicolectomy Vascular/Lymphatic: Diffuse atherosclerotic calcification. No mass or adenopathy. Reproductive:Symmetric prostate enlargement. Other: Small non loculated ascites and Extensive body wall edema. Small inguinal hernias, fatty on the left and ascitic on the right. Musculoskeletal: No acute abnormalities. Diffuse degenerative change. IMPRESSION: 1. Moderate pericardial effusion not noted on recent echocardiography. Neighboring fat is reticulated and pericarditis should be considered. 2. Volume overload with marked anasarca, moderate bilateral pleural effusion, and small ascites. 3. Multi segment atelectasis. Electronically Signed   By: Monte Fantasia M.D.   On: 09/10/2016 16:53   US Renal  Result Date: 09/06/2016 CLINICAL DATA:  Acute kidney injury, rising BUN and creatinine EXAM: RENAL / URINARY TRACT ULTRASOUND COMPLETE COMPARISON:  11/28/2014 FINDINGS: Right Kidney: Length: 10.2 cm. Echogenicity within normal limits. Thinning of the renal cortex. No hydronephrosis or mass. Left Kidney: Length: 10.7 cm. Echogenicity within normal limits. Limited visualization of the left kidney. Hypoechoic area seen on the prior ultrasound is not visualized. Bladder: Foley catheter is present.  Bladder empty. IMPRESSION: 1. Suboptimal visualization of left kidney. 2. No hydronephrosis 3. With thinning of  the renal cortex consistent . Electronically Signed   By: Donavan Foil M.D.   On: 09/06/2016 22:24     Subjective: Patient feeling well this morning.  He does report that he previously had a foley catheter and therefore feels comfortable discharging to [facility] with one.  No new events overnight.  Discharge Exam: Vitals:   09/17/16 2158 09/18/16 0550  BP: (!) 178/70 (!) 140/51  Pulse: 61 76  Resp: 20 16  Temp: 98.7 F (37.1 C) 98.9 F (37.2 C)   Vitals:   09/17/16 1445 09/17/16 2158 09/18/16 0500 09/18/16 0550  BP: (!) 175/60 (!)  178/70  (!) 140/51  Pulse: (!) 50 61  76  Resp: 19 20  16   Temp: 98.1 F (36.7 C) 98.7 F (37.1 C)  98.9 F (37.2 C)  TempSrc: Oral Oral  Oral  SpO2: 100% 99%  98%  Weight:   108.2 kg (238 lb 8.6 oz)   Height:        General: Pt is alert, awake, not in acute distress Cardiovascular: RRR, S1/S2 +, no rubs, no gallops Respiratory: CTA bilaterally, no wheezing, no rhonchi Abdominal: Soft, NT, ND, bowel sounds + Extremities: off loading boots in place, dressings clean dry and intact bilaterally, onchomyochosesis bilaterally    The results of significant diagnostics from this hospitalization (including imaging, microbiology, ancillary and laboratory) are listed below for reference.     Microbiology: Recent Results (from the past 240 hour(s))  Culture, blood (routine x 2)     Status: None   Collection Time: 09/10/16  1:30 PM  Result Value Ref Range Status   Specimen Description BLOOD RIGHT ARM  Final   Special Requests BOTTLES DRAWN AEROBIC AND ANAEROBIC 10CC  Final   Culture   Final    NO GROWTH 5 DAYS Performed at [facility], [place] [address] [place], [place] [address]    Report Status 09/15/2016 FINAL  Final  Culture, blood (routine x 2)     Status: None   Collection Time: 09/10/16  3:14 PM  Result Value Ref Range Status   Specimen Description RIGHT ANTECUBITAL  Final   Special Requests BOTTLES DRAWN AEROBIC AND ANAEROBIC 10CC  Final   Culture   Final    NO GROWTH 5 DAYS Performed at [facility], [place] [address] [place], [place] [address]    Report Status 09/15/2016 FINAL  Final  MRSA PCR Screening     Status: None   Collection Time: 09/10/16  6:00 PM  Result Value Ref Range Status   MRSA by PCR NEGATIVE NEGATIVE Final    Comment:        The GeneXpert MRSA Assay (FDA approved for NASAL specimens only), is one component of a comprehensive MRSA colonization surveillance program. It is not intended to diagnose MRSA infection nor to guide or monitor  treatment for MRSA infections.      Labs: BNP (last 3 results)  Recent Labs  07/18/16 1255 07/22/16 1933 09/05/16 0857  BNP 242.1* 268.7* 0000000*   Basic Metabolic Panel:  Recent Labs Lab 09/14/16 0325 09/15/16 0335 09/16/16 0548 09/17/16 0533 09/18/16 0534  NA 137 138 138 138 138  K 4.8 4.7 4.5 4.6 4.9  CL 104 103 103 101 101  CO2 23 25 25 27 26   GLUCOSE 122* 152* 143* 140* 138*  BUN 63* 64* 56* 52* 47*  CREATININE 3.96* 3.47* 3.18* 3.30* 3.20*  CALCIUM 8.1* 8.2* 8.1* 8.2* 8.2*  MG 1.5* 1.5* 1.5*  1.4* 1.4*  PHOS 5.7* 5.6* 4.7* 4.1 3.9   Liver Function Tests:  Recent Labs Lab 09/14/16 0325 09/15/16 0335 09/16/16 0548 09/17/16 0533 09/18/16 0534  AST 10* 10* 10* 12* 14*  ALT 12* 11* 9* 10* 11*  ALKPHOS 55 50 51 55 56  BILITOT 0.7 0.7 0.6 0.6 1.0  PROT 6.2* 6.7 6.4* 6.5 6.4*  ALBUMIN 2.7* 2.7* 2.7* 3.1* 3.0*   No results for input(s): LIPASE, AMYLASE in the last 168 hours. No results for input(s): AMMONIA in the last 168 hours. CBC:  Recent Labs Lab 09/14/16 0325 09/15/16 0335 09/16/16 0548 09/17/16 0533 09/18/16 0534  WBC 7.7 7.1 8.5 7.8 9.5  NEUTROABS 3.6 3.2 4.3 4.1 5.1  HGB 7.5* 8.0* 7.9* 8.2* 8.4*  HCT 24.6* 26.2* 26.0* 26.8* 27.7*  MCV 76.9* 77.3* 77.4* 77.9* 77.6*  PLT 179 189 203 215 260   Cardiac Enzymes: No results for input(s): CKTOTAL, CKMB, CKMBINDEX, TROPONINI in the last 168 hours. BNP: Invalid input(s): POCBNP CBG:  Recent Labs Lab 09/17/16 1155 09/17/16 1643 09/17/16 2157 09/18/16 0735 09/18/16 1200  GLUCAP 169* 143* 146* 153* 154*   D-Dimer No results for input(s): DDIMER in the last 72 hours. Hgb A1c No results for input(s): HGBA1C in the last 72 hours. Lipid Profile No results for input(s): CHOL, HDL, LDLCALC, TRIG, CHOLHDL, LDLDIRECT in the last 72 hours. Thyroid function studies No results for input(s): TSH, T4TOTAL, T3FREE, THYROIDAB in the last 72 hours.  Invalid input(s): FREET3 Anemia work up No  results for input(s): VITAMINB12, FOLATE, FERRITIN, TIBC, IRON, RETICCTPCT in the last 72 hours. Urinalysis    Component Value Date/Time   COLORURINE YELLOW 09/12/2016 1323   APPEARANCEUR HAZY (A) 09/12/2016 1323   LABSPEC 1.011 09/12/2016 1323   PHURINE 5.0 09/12/2016 1323   GLUCOSEU NEGATIVE 09/12/2016 1323   HGBUR MODERATE (A) 09/12/2016 1323   BILIRUBINUR NEGATIVE 09/12/2016 1323   KETONESUR NEGATIVE 09/12/2016 1323   PROTEINUR NEGATIVE 09/12/2016 1323   UROBILINOGEN 1.0 05/18/2015 1407   NITRITE NEGATIVE 09/12/2016 1323   LEUKOCYTESUR NEGATIVE 09/12/2016 1323   Sepsis Labs Invalid input(s): PROCALCITONIN,  WBC,  LACTICIDVEN Microbiology Recent Results (from the past 240 hour(s))  Culture, blood (routine x 2)     Status: None   Collection Time: 09/10/16  1:30 PM  Result Value Ref Range Status   Specimen Description BLOOD RIGHT ARM  Final   Special Requests BOTTLES DRAWN AEROBIC AND ANAEROBIC 10CC  Final   Culture   Final    NO GROWTH 5 DAYS Performed at [facility], [place] [address], [place], [place] [address]    Report Status 09/15/2016 FINAL  Final  Culture, blood (routine x 2)     Status: None   Collection Time: 09/10/16  3:14 PM  Result Value Ref Range Status   Specimen Description RIGHT ANTECUBITAL  Final   Special Requests BOTTLES DRAWN AEROBIC AND ANAEROBIC 10CC  Final   Culture   Final    NO GROWTH 5 DAYS Performed at [facility], [place] [address] [place], [place] [address]    Report Status 09/15/2016 FINAL  Final  MRSA PCR Screening     Status: None   Collection Time: 09/10/16  6:00 PM  Result Value Ref Range Status   MRSA by PCR NEGATIVE NEGATIVE Final    Comment:        The GeneXpert MRSA Assay (FDA approved for NASAL specimens only), is one component of a comprehensive MRSA colonization surveillance program.  It is not intended to diagnose MRSA infection nor to guide or monitor treatment for MRSA infections.      Time coordinating  discharge: Over 30 minutes  SIGNED:   Loretha Stapler, MD  [facility] 09/18/2016, 1:32 PM Pager [phone] If 7PM-7AM, please contact night-coverage [email or web address] Password [email or web address]

## 2016-09-18 NOTE — Progress Notes (Signed)
Report called to Rosalita Chessman, Therapist, sports, at [facility]. PTAR arranged by SW. Will continue to monitor until transfer.

## 2016-09-19 ENCOUNTER — Non-Acute Institutional Stay (SKILLED_NURSING_FACILITY): Payer: Medicare HMO | Admitting: Adult Health

## 2016-09-19 ENCOUNTER — Encounter: Payer: Self-pay | Admitting: Adult Health

## 2016-09-19 DIAGNOSIS — E1121 Type 2 diabetes mellitus with diabetic nephropathy: Secondary | ICD-10-CM

## 2016-09-19 DIAGNOSIS — L89312 Pressure ulcer of right buttock, stage 2: Secondary | ICD-10-CM

## 2016-09-19 DIAGNOSIS — Z794 Long term (current) use of insulin: Secondary | ICD-10-CM

## 2016-09-19 DIAGNOSIS — N179 Acute kidney failure, unspecified: Secondary | ICD-10-CM

## 2016-09-19 DIAGNOSIS — N183 Chronic kidney disease, stage 3 unspecified: Secondary | ICD-10-CM

## 2016-09-19 DIAGNOSIS — IMO0002 Reserved for concepts with insufficient information to code with codable children: Secondary | ICD-10-CM

## 2016-09-19 DIAGNOSIS — E1165 Type 2 diabetes mellitus with hyperglycemia: Secondary | ICD-10-CM | POA: Diagnosis not present

## 2016-09-19 DIAGNOSIS — E785 Hyperlipidemia, unspecified: Secondary | ICD-10-CM | POA: Diagnosis not present

## 2016-09-19 DIAGNOSIS — I480 Paroxysmal atrial fibrillation: Secondary | ICD-10-CM

## 2016-09-19 DIAGNOSIS — I10 Essential (primary) hypertension: Secondary | ICD-10-CM | POA: Diagnosis not present

## 2016-09-19 DIAGNOSIS — R5381 Other malaise: Secondary | ICD-10-CM

## 2016-09-19 DIAGNOSIS — D631 Anemia in chronic kidney disease: Secondary | ICD-10-CM

## 2016-09-19 DIAGNOSIS — I5033 Acute on chronic diastolic (congestive) heart failure: Secondary | ICD-10-CM

## 2016-09-19 NOTE — Progress Notes (Signed)
DATE:  09/19/2016   MRN:  [id number]  BIRTHDAY: 1934/05/07  Facility:  [facility]ursing Home Location:  [facility] Room Number: 403-B  LEVEL OF CARE:  SNF (31)  Contact Information    Name Relation Home Work Mobile   [place] Daughter [phone]  [phone]   [name] [phone]         Code Status History    Date Active Date Inactive Code Status Order ID Comments User Context   09/05/2016  7:58 PM 09/18/2016  6:28 PM Full Code [id number]  Barton Dubois, MD Inpatient   12/08/2015  4:00 PM 12/12/2015  5:44 PM Full Code [id number]  Radene Gunning, NP ED   12/08/2015  3:34 PM 12/08/2015  4:00 PM Full Code [id number]  Radene Gunning, NP ED   08/28/2015  5:31 PM 09/02/2015  5:00 PM Full Code [id number]  Radene Gunning, NP Inpatient   07/31/2015  1:33 PM 08/05/2015  7:40 PM Full Code [id number]  Robbie Lis, MD ED   11/23/2014  6:07 PM 11/29/2014  5:11 PM Full Code [id number]  Reyne Dumas, MD ED   02/12/2014  3:13 AM 02/25/2014  2:13 PM Full Code [id number]  Toy Baker, MD Inpatient   11/12/2013  8:24 PM 11/17/2013  9:20 PM Full Code [id number]  Theressa Millard, MD Inpatient   07/21/2013 11:40 AM 07/25/2013  7:23 PM Full Code [id number]  Verlee Monte, MD Inpatient       Chief Complaint  Patient presents with  . Hospitalization Follow-up    HISTORY OF PRESENT ILLNESS:  This is an 83 year old male seen for hospital follow-up.  He was admitted to [facility] on 09/18/2016 for short-term rehabilitation following an admission at [facility] 09/05/2016-09/18/2016 for SOB, worsening LE edema, and inability to urinate.  BNP on admission 557, chest x-ray showed small left pleural effusion with primary congestion. Cardiology was consulted and repeated ECHO showed IVC is dilated possibly due to volume overload. He was diuresed with Lasix 120 mg twice a day. Currently not on ACEi or ARB due to renal function. Acute renal failure was taught to be due to  poor perfusion from CHF exacerbation and likely from decreased BP plus component of urinary retention most likely from BPH. Creatinine noted to be trending down from 3.67 to 3.30, with baseline 2.6-2.9 .  Nephrology was consulted and was placed on Lasix 120 mg twice a day.  He has PMH of chronic diastolic heart failure, chronic kidney disease, stage III, obesity, lower extremity lymphedema, CAD, hypertension, atrial fibrillation on Eliquis, hyperlipidemia and diabetes mellitus with nephropathy.  He was seen in the room today and did not verbalize any complaints.  PAST MEDICAL HISTORY:  Past Medical History:  Diagnosis Date  . Atrial fibrillation ([place])   . CHF (congestive heart failure) ([place])   . Chronic kidney disease   . Coronary artery disease   . Diabetes mellitus without complication ([place])   . Hypertension      CURRENT MEDICATIONS: Reviewed  Patient's Medications  New Prescriptions   No medications on file  Previous Medications   APIXABAN (ELIQUIS) 2.5 MG TABS TABLET    Take 1 tablet (2.5 mg total) by mouth 2 (two) times daily.   ATORVASTATIN (LIPITOR) 20 MG TABLET    Take 1 tablet (20 mg total) by mouth daily at 6 PM.   BISACODYL (DULCOLAX) 5 MG EC TABLET    Take 1 tablet (5 mg total) by mouth  daily as needed for moderate constipation.   DARBEPOETIN ALFA (ARANESP) 150 MCG/0.3ML SOSY INJECTION    Inject 0.3 mLs (150 mcg total) into the skin every 7 (seven) days.   FUROSEMIDE (LASIX) 40 MG TABLET    Take 3 tablets (120 mg total) by mouth 2 (two) times daily.   HYDRALAZINE (APRESOLINE) 25 MG TABLET    Take 3 tablets (75 mg total) by mouth every 8 (eight) hours.   HYDROCODONE-ACETAMINOPHEN (NORCO/VICODIN) 5-325 MG TABLET    Take 1-2 tablets by mouth every 6 (six) hours as needed for moderate pain or severe pain.   INSULIN ASPART (NOVOLOG) 100 UNIT/ML INJECTION    Inject 0-9 Units into the skin 3 (three) times daily with meals.   INSULIN ASPART (NOVOLOG) 100 UNIT/ML INJECTION    Inject  0-5 Units into the skin at bedtime.   NYSTATIN (MYCOSTATIN/NYSTOP) POWDER    Apply 1 g topically 2 (two) times daily. For 14 days  Modified Medications   No medications on file  Discontinued Medications   No medications on file     Allergies  Allergen Reactions  . Metformin And Related     Reaction: pt states he thinks he threw up     REVIEW OF SYSTEMS:  GENERAL: no change in appetite, no fatigue, no weight changes, no fever, chills or weakness EYES: Denies change in vision, dry eyes, eye pain, itching or discharge EARS: Denies change in hearing, ringing in ears, or earache NOSE: Denies nasal congestion or epistaxis MOUTH and THROAT: Denies oral discomfort, gingival pain or bleeding, pain from teeth or hoarseness   RESPIRATORY: no cough, SOB, DOE, wheezing, hemoptysis CARDIAC: no chest pain, edema or palpitations GI: no abdominal pain, diarrhea, constipation, heart burn, nausea or vomiting GU: Denies dysuria, frequency, hematuria, incontinence, or discharge PSYCHIATRIC: Denies feeling of depression or anxiety. No report of hallucinations, insomnia, paranoia, or agitation    PHYSICAL EXAMINATION  GENERAL APPEARANCE: Well nourished. In no acute distress. Obese SKIN:  Right inner buttock stage II pressure ulcer, BLE wrapped with unna boots HEAD: Normal in size and contour. No evidence of trauma EYES: Lids open and close normally. No blepharitis, entropion or ectropion. PERRL. Conjunctivae are cle r and sclerae are white. Lenses are without opacity EARS: Pinnae are normal. Patient hears normal voice tunes of the examiner MOUTH and THROAT: Lips are without lesions. Oral mucosa is moist and without lesions. Tongue is normal in shape, size, and color and without lesions NECK: supple, trachea midline, no neck masses, no thyroid tenderness, no thyromegaly LYMPHATICS: no LAN in the neck, no supraclavicular LAN RESPIRATORY: breathing is even & unlabored, BS CTAB CARDIAC: RRR, no  murmur,no extra heart sounds, no edema GI: abdomen soft, normal BS, no masses, no tenderness, no hepatomegaly, no splenomegaly EXTREMITIES:  Able to move 4 extremities PSYCHIATRIC: Alert and oriented X 3. Affect and behavior are appropriate  LABS/RADIOLOGY: Labs reviewed: Basic Metabolic Panel:  Recent Labs  09/16/16 0548 09/17/16 0533 09/18/16 0534  NA 138 138 138  K 4.5 4.6 4.9  CL 103 101 101  CO2 25 27 26   GLUCOSE 143* 140* 138*  BUN 56* 52* 47*  CREATININE 3.18* 3.30* 3.20*  CALCIUM 8.1* 8.2* 8.2*  MG 1.5* 1.4* 1.4*  PHOS 4.7* 4.1 3.9   Liver Function Tests:  Recent Labs  09/16/16 0548 09/17/16 0533 09/18/16 0534  AST 10* 12* 14*  ALT 9* 10* 11*  ALKPHOS 51 55 56  BILITOT 0.6 0.6 1.0  PROT 6.4* 6.5 6.4*  ALBUMIN 2.7* 3.1* 3.0*    Recent Labs  12/04/15 1431  LIPASE 30   CBC:  Recent Labs  09/16/16 0548 09/17/16 0533 09/18/16 0534  WBC 8.5 7.8 9.5  NEUTROABS 4.3 4.1 5.1  HGB 7.9* 8.2* 8.4*  HCT 26.0* 26.8* 27.7*  MCV 77.4* 77.9* 77.6*  PLT 203 215 260   Lipid Panel:  Recent Labs  12/08/15 1713 12/09/15 0717  HDL 33* 25*   Cardiac Enzymes:  Recent Labs  09/06/16 1052 09/09/16 1728 09/10/16 0820  TROPONINI 0.03* <0.03 <0.03   CBG:  Recent Labs  09/17/16 2157 09/18/16 0735 09/18/16 1200  GLUCAP 146* 153* 154*      Ct Abdomen Pelvis Wo Contrast  Result Date: 09/10/2016 CLINICAL DATA:  CHF.  Leukocytosis. EXAM: CT CHEST, ABDOMEN AND PELVIS WITHOUT CONTRAST TECHNIQUE: Multidetector CT imaging of the chest, abdomen and pelvis was performed following the standard protocol without IV contrast. COMPARISON:  Chest CT 08/29/2015 FINDINGS: CT CHEST FINDINGS Cardiovascular: Chronic cardiomegaly. There is a moderate pericardial effusion that is new from echocardiogram report 09/06/2016. Some of the fat around the effusion is infiltrated and pericarditis is considered. No convincing ventricular wall or septal distortion from mass-effect.  Mediastinum/Nodes: Diffuse atherosclerotic calcification. No noncontrast evidence of acute vascular disease. Lungs/Pleura: Moderate layering pleural effusions, larger on the left. Multi segment atelectasis. Musculoskeletal: No acute or aggressive finding. Exaggerated thoracic kyphosis with spondylosis with multi-level ankylosis. CT ABDOMEN PELVIS FINDINGS Hepatobiliary: No focal liver abnormality.Extensive calcification in the gallbladder fossa. This is likely luminal and from large gallstone rather than mural calcification. No common bile duct dilatation. Pancreas: Unremarkable. Spleen: Unremarkable. Adrenals/Urinary Tract: Negative adrenals. Smooth bilateral renal atrophy. No hydronephrosis. Cortical and hilar cyst at the left inferior pole. The bladder is decompressed around a Foley catheter. Stomach/Bowel:  No obstruction. Status post right hemicolectomy Vascular/Lymphatic: Diffuse atherosclerotic calcification. No mass or adenopathy. Reproductive:Symmetric prostate enlargement. Other: Small non loculated ascites and Extensive body wall edema. Small inguinal hernias, fatty on the left and ascitic on the right. Musculoskeletal: No acute abnormalities. Diffuse degenerative change. IMPRESSION: 1. Moderate pericardial effusion not noted on recent echocardiography. Neighboring fat is reticulated and pericarditis should be considered. 2. Volume overload with marked anasarca, moderate bilateral pleural effusion, and small ascites. 3. Multi segment atelectasis. Electronically Signed   By: Monte Fantasia M.D.   On: 09/10/2016 16:53   Dg Chest 2 View  Result Date: 09/10/2016 CLINICAL DATA:  Leukocytosis. EXAM: CHEST  2 VIEW COMPARISON:  09/09/2016. FINDINGS: Cardiomegaly with pulmonary vascular prominence and bilateral pulmonary infiltrates. Bilateral pleural effusions. Findings consistent congestive heart failure. Bilateral pneumonia cannot be excluded. Findings progressed from 09/09/2016. IMPRESSION: Congestive  heart failure with bilateral pulmonary edema and bilateral pleural effusions, progressed from prior exam. Bilateral pneumonia cannot be excluded. Electronically Signed   By: Marcello Moores  Register   On: 09/10/2016 14:48   Dg Chest 2 View  Result Date: 09/09/2016 CLINICAL DATA:  Shortness of breath. EXAM: CHEST  2 VIEW COMPARISON:  09/05/2016. FINDINGS: Cardiomegaly with pulmonary vascular prominence and bilateral interstitial prominence with bilateral pleural effusions. Findings consistent with congestive heart failure. No pneumothorax. IMPRESSION: Congestive heart failure with bilateral pulmonary interstitial edema and bilateral pleural effusions. Similar findings noted on prior exam. Electronically Signed   By: Marcello Moores  Register   On: 09/09/2016 09:27   Dg Chest 2 View  Result Date: 09/05/2016 CLINICAL DATA:  Shortness of Breath EXAM: CHEST  2 VIEW COMPARISON:  July 22, 2016 FINDINGS: There is a left pleural effusion with left  base atelectasis. Lungs elsewhere clear. There is cardiomegaly with pulmonary venous hypertension. There is atherosclerotic calcification in the aorta. No adenopathy. There is degenerative change in each shoulder. IMPRESSION: Small left pleural effusion with left base atelectasis. Lungs elsewhere clear. There is underlying pulmonary vascular congestion. There is aortic atherosclerosis. Electronically Signed   By: Lowella Grip III M.D.   On: 09/05/2016 11:07   Dg Abd 1 View  Result Date: 09/10/2016 CLINICAL DATA:  Leukocytosis. EXAM: ABDOMEN - 1 VIEW COMPARISON:  Ultrasound 09/06/2016.  Ultrasound 11/24/2014. FINDINGS: Soft tissue structures are unremarkable. Surgical sutures noted over the right abdomen. Rounded calcification right upper quadrant consistent with cholelithiasis. Mildly prominent air -filled loops of small and large bowel noted. Adynamic ileus cannot be excluded. No free air. No acute bony or joint abnormality identified. Vascular calcification. IMPRESSION: 1.  Mildly prominent air-filled loops of small and large bowel noted. Mild adynamic ileus cannot be excluded. Follow-up abdominal series to exclude bowel obstruction. 2. Prominent calcific density right upper quadrant most likely large gallstone. Electronically Signed   By: Marcello Moores  Register   On: 09/10/2016 14:52   Ct Chest Wo Contrast  Result Date: 09/10/2016 CLINICAL DATA:  CHF.  Leukocytosis. EXAM: CT CHEST, ABDOMEN AND PELVIS WITHOUT CONTRAST TECHNIQUE: Multidetector CT imaging of the chest, abdomen and pelvis was performed following the standard protocol without IV contrast. COMPARISON:  Chest CT 08/29/2015 FINDINGS: CT CHEST FINDINGS Cardiovascular: Chronic cardiomegaly. There is a moderate pericardial effusion that is new from echocardiogram report 09/06/2016. Some of the fat around the effusion is infiltrated and pericarditis is considered. No convincing ventricular wall or septal distortion from mass-effect. Mediastinum/Nodes: Diffuse atherosclerotic calcification. No noncontrast evidence of acute vascular disease. Lungs/Pleura: Moderate layering pleural effusions, larger on the left. Multi segment atelectasis. Musculoskeletal: No acute or aggressive finding. Exaggerated thoracic kyphosis with spondylosis with multi-level ankylosis. CT ABDOMEN PELVIS FINDINGS Hepatobiliary: No focal liver abnormality.Extensive calcification in the gallbladder fossa. This is likely luminal and from large gallstone rather than mural calcification. No common bile duct dilatation. Pancreas: Unremarkable. Spleen: Unremarkable. Adrenals/Urinary Tract: Negative adrenals. Smooth bilateral renal atrophy. No hydronephrosis. Cortical and hilar cyst at the left inferior pole. The bladder is decompressed around a Foley catheter. Stomach/Bowel:  No obstruction. Status post right hemicolectomy Vascular/Lymphatic: Diffuse atherosclerotic calcification. No mass or adenopathy. Reproductive:Symmetric prostate enlargement. Other: Small non  loculated ascites and Extensive body wall edema. Small inguinal hernias, fatty on the left and ascitic on the right. Musculoskeletal: No acute abnormalities. Diffuse degenerative change. IMPRESSION: 1. Moderate pericardial effusion not noted on recent echocardiography. Neighboring fat is reticulated and pericarditis should be considered. 2. Volume overload with marked anasarca, moderate bilateral pleural effusion, and small ascites. 3. Multi segment atelectasis. Electronically Signed   By: Monte Fantasia M.D.   On: 09/10/2016 16:53   US Renal  Result Date: 09/06/2016 CLINICAL DATA:  Acute kidney injury, rising BUN and creatinine EXAM: RENAL / URINARY TRACT ULTRASOUND COMPLETE COMPARISON:  11/28/2014 FINDINGS: Right Kidney: Length: 10.2 cm. Echogenicity within normal limits. Thinning of the renal cortex. No hydronephrosis or mass. Left Kidney: Length: 10.7 cm. Echogenicity within normal limits. Limited visualization of the left kidney. Hypoechoic area seen on the prior ultrasound is not visualized. Bladder: Foley catheter is present.  Bladder empty. IMPRESSION: 1. Suboptimal visualization of left kidney. 2. No hydronephrosis 3. With thinning of the renal cortex consistent . Electronically Signed   By: Donavan Foil M.D.   On: 09/06/2016 22:24    ASSESSMENT/PLAN:  Physical deconditioning - for  rehabilitation, PT and OT, for therapeutic strengthening exercises; fall precautions  Acute on chronic diastolic heart failure - echocardiogram showed EF 55-60%; cardiology was consulted and repeated echo which showed IVC is stable related possibly due to volume overload. BNP on admission is 557; continue Lasix 120 mg by mouth twice a day, currently in no ACEi or ARB due to renal function; left with cardiology; check weights daily Mondays, Wednesdays and Fridays; fluid restrictions of 1500 mL/day  Acute on chronic kidney disease, stage IV - due to poor perfusion from CHF exacerbation and likely from decreased BP  class component of urinary retention most likely from BPH; nephrology was consulted and does not feel like dialysis is indicated at this point; follow-up with new pathology; check BMP on 09/20/16  Anemia of chronic renal disease - will monitor; check CBC on 09/20/16; continue Aranesp 150 g subcutaneous every Mondays Lab Results  Component Value Date   HGB 8.4 (L) 09/18/2016   Diabetes mellitus, type II with appropriate the - continue NovoLog sliding scale TID and at bedtime Lab Results  Component Value Date   HGBA1C 6.4 (H) 09/06/2016   Paroxysmal atrial fibrillation - cardiology does not feel patient needing pacemaker at this time; continue Eliquis 2.5 mg 1 tab by mouth twice a day; follow-up with cardiology  Essential hypertension - continue hydralazine 25 mg take 3 tabs = 75 mg by mouth every 8 hours  Hyperlipidemia - continue Lipitor 20 mg 1 tab by mouth every 6 p.m.  Right buttock pressure ulcer, stage II - continue wound treatment, keep skin clean and dry     Goals of care:  Short-term rehabilitation    Kateleen Encarnacion C. [place] - NP [employer] [phone]

## 2016-09-20 LAB — BASIC METABOLIC PANEL
BUN: 45 mg/dL — AB (ref 4–21)
Creatinine: 3.6 mg/dL — AB (ref 0.6–1.3)
GLUCOSE: 102 mg/dL
POTASSIUM: 4.6 mmol/L (ref 3.4–5.3)
SODIUM: 139 mmol/L (ref 137–147)

## 2016-09-20 LAB — CBC AND DIFFERENTIAL
HCT: 28 % — AB (ref 41–53)
Hemoglobin: 8.2 g/dL — AB (ref 13.5–17.5)
Platelets: 250 10*3/uL (ref 150–399)
WBC: 8.7 10^3/mL

## 2016-09-23 ENCOUNTER — Encounter: Payer: Self-pay | Admitting: Internal Medicine

## 2016-09-23 ENCOUNTER — Non-Acute Institutional Stay (SKILLED_NURSING_FACILITY): Payer: Medicare HMO | Admitting: Internal Medicine

## 2016-09-23 DIAGNOSIS — D638 Anemia in other chronic diseases classified elsewhere: Secondary | ICD-10-CM

## 2016-09-23 DIAGNOSIS — I480 Paroxysmal atrial fibrillation: Secondary | ICD-10-CM

## 2016-09-23 DIAGNOSIS — I890 Lymphedema, not elsewhere classified: Secondary | ICD-10-CM

## 2016-09-23 DIAGNOSIS — L8942 Pressure ulcer of contiguous site of back, buttock and hip, stage 2: Secondary | ICD-10-CM

## 2016-09-23 DIAGNOSIS — I441 Atrioventricular block, second degree: Secondary | ICD-10-CM

## 2016-09-23 DIAGNOSIS — K5900 Constipation, unspecified: Secondary | ICD-10-CM

## 2016-09-23 DIAGNOSIS — R300 Dysuria: Secondary | ICD-10-CM

## 2016-09-23 DIAGNOSIS — N184 Chronic kidney disease, stage 4 (severe): Secondary | ICD-10-CM

## 2016-09-23 DIAGNOSIS — L22 Diaper dermatitis: Secondary | ICD-10-CM

## 2016-09-23 DIAGNOSIS — R5381 Other malaise: Secondary | ICD-10-CM | POA: Diagnosis not present

## 2016-09-23 DIAGNOSIS — I5033 Acute on chronic diastolic (congestive) heart failure: Secondary | ICD-10-CM | POA: Diagnosis not present

## 2016-09-23 DIAGNOSIS — E1121 Type 2 diabetes mellitus with diabetic nephropathy: Secondary | ICD-10-CM

## 2016-09-23 NOTE — Progress Notes (Signed)
LOCATION: [facility]  PCP: [name], PA   Code Status: Full Code  Goals of care: Advanced Directive information Advanced Directives 09/05/2016  Does Patient Have a Medical Advance Directive? No  Would patient like information on creating a medical advance directive? No - Patient declined  Pre-existing out of facility DNR order (yellow form or pink MOST form) -       Extended Emergency Contact Information Primary Emergency Contact: Marcum,Tami Address: [address]          [place], [place] [address] Johnnette Litter of [place] Phone: [phone] Mobile Phone: [phone] Relation: Daughter Secondary Emergency Contact: Dedra Skeens [place] of [place] Phone: [phone] Relation: Friend   Allergies  Allergen Reactions  . Metformin And Related     Reaction: pt states he thinks he threw up    Chief Complaint  Patient presents with  . New Admit To SNF    New Admission Visit      HPI:  Patient is a 83 y.o. male seen today for short term rehabilitation post hospital admission from 09/05/16-1/24/18With worsening dyspnea and inability to urinate. He was treated for acute on chronic diastolic heart failure with IV diuresis and acute on chronic renal failure. Echocardiogram showed ejection fraction 55-60%. He was seen by cardiology and nephrology. He had a Foley catheter placed for acute urinary retention. He had unna boot placed for his lymphedema. He was also placed on IV antibiotics initially with concern for infection but all the infectious workup was negative. Antibiotics were then discontinued. he has medical history of chronic diastolic congestive heart failure, chronic kidney disease stage, lymphedema of lower extremity, coronary artery disease, atrial fibrillation, hypertension, diabetes and obesity among others. He is seen in his room today.  Review of Systems:  Constitutional: Negative for fever, chills, diaphoresis.  HENT: Negative for  headache, congestion, sore throat, difficulty swallowing.  positive for runny nose.  Eyes: Negative for eye pain, blurred vision.  Respiratory: Negative for cough and wheezing.   Cardiovascular: Negative for chest pain, palpitations.  Gastrointestinal: Negative for heartburn, vomiting, abdominal pain. Positive for loss of appetite and occasional nausea. Has been constipated. Genitourinary: Positive for dysuria for 3 days. Negative for flank pain.  Musculoskeletal: Negative for back pain, fall. positive for leg pain.  Skin: Negative for itching, rash.  Neurological: Negative for dizziness. Psychiatric/Behavioral: Negative for depression   Past Medical History:  Diagnosis Date  . Atrial fibrillation ([place])   . CHF (congestive heart failure) ([place])   . Chronic kidney disease   . Coronary artery disease   . Diabetes mellitus without complication ([place])   . Hypertension    Past Surgical History:  Procedure Laterality Date  . COLONOSCOPY  06/21/2012   Procedure: COLONOSCOPY;  Surgeon: Juanita Craver, MD;  Location: [facility] ENDOSCOPY;  Service: Endoscopy;  Laterality: N/A;  . COLONOSCOPY Left 02/13/2014   Procedure: COLONOSCOPY;  Surgeon: Juanita Craver, MD;  Location[facility] ENDOSCOPY;  Service: Endoscopy;  Laterality: Left;  . ESOPHAGOGASTRODUODENOSCOPY  06/19/2012   Procedure: ESOPHAGOGASTRODUODENOSCOPY (EGD);  Surgeon: Beryle Beams, MD;  Location: [facility] ENDOSCOPY;  Service: Endoscopy;  Laterality: N/A;  . LAPAROSCOPIC RIGHT COLECTOMY Right 02/15/2014   Procedure: LAPAROSCOPIC ASISSTED RIGHT  COLECTOMY, POSSIBLE OPEN;  Surgeon: Rolm Bookbinder, MD;  Location: [facility];  Service: General;  Laterality: Right;  . LEG SURGERY     Left leg surgery. broken leg   Social History:   reports that he quit smoking about 18 years ago. He has  never used smokeless tobacco. He reports that he does not drink alcohol or use drugs.  Family History  Problem Relation Age of Onset  . Brain cancer Mother   . Diabetes type II  Sister   . Narcolepsy Paternal Uncle     Medications: Allergies as of 09/23/2016      Reactions   Metformin And Related    Reaction: pt states he thinks he threw up      Medication List       Accurate as of 09/23/16 12:37 PM. Always use your most recent med list.          apixaban 2.5 MG Tabs tablet Commonly known as:  ELIQUIS Take 1 tablet (2.5 mg total) by mouth 2 (two) times daily.   atorvastatin 20 MG tablet Commonly known as:  LIPITOR Take 1 tablet (20 mg total) by mouth daily at 6 PM.   bisacodyl 5 MG EC tablet Commonly known as:  DULCOLAX Take 1 tablet (5 mg total) by mouth daily as needed for moderate constipation.   Darbepoetin Alfa 150 MCG/0.3ML Sosy injection Commonly known as:  ARANESP Inject 0.3 mLs (150 mcg total) into the skin every 7 (seven) days.   furosemide 40 MG tablet Commonly known as:  LASIX Take 3 tablets (120 mg total) by mouth 2 (two) times daily.   hydrALAZINE 25 MG tablet Commonly known as:  APRESOLINE Take 3 tablets (75 mg total) by mouth every 8 (eight) hours.   HYDROcodone-acetaminophen 5-325 MG tablet Commonly known as:  NORCO/VICODIN Take 1-2 tablets by mouth every 6 (six) hours as needed for moderate pain or severe pain.   insulin aspart 100 UNIT/ML injection Commonly known as:  novoLOG Inject 0-9 Units into the skin 3 (three) times daily with meals.   insulin aspart 100 UNIT/ML injection Commonly known as:  novoLOG Inject 0-5 Units into the skin at bedtime.   nystatin powder Commonly known as:  MYCOSTATIN/NYSTOP Apply 1 g topically 2 (two) times daily. For 14 days       Immunizations: Immunization History  Administered Date(s) Administered  . Influenza Split 06/25/2011  . Influenza, High Dose Seasonal PF 06/07/2013  . Influenza,inj,Quad PF,36+ Mos 09/06/2016  . Pneumococcal Polysaccharide-23 02/25/2012, 06/20/2012  . Td 01/28/2005  . Tdap 10/20/2014, 04/14/2016     Physical Exam: Vitals:   09/23/16 1233  BP:  (!) 180/73  Pulse: 67  Resp: 18  Temp: 99 F (37.2 C)  TempSrc: Oral  SpO2: 93%  Weight: 238 lb (108 kg)  Height: 5\' 10"  (1.778 m)   Body mass index is 34.15 kg/m.  General- elderly male, obese, in no acute distress Head- normocephalic, atraumatic Nose- + nasal discharge Throat- moist mucus membrane Eyes- PERRLA, EOMI, no pallor, no icterus, no discharge, normal conjunctiva, normal sclera Neck- no cervical lymphadenopathy Cardiovascular- normal s1,s2, no murmur Respiratory- bilateral clear to auscultation, no wheeze, no rhonchi, no crackles, no use of accessory muscles Abdomen- bowel sounds present, soft, non tender, no guarding or rigidity, no CVA tenderness Musculoskeletal- able to move all 4 extremities, weakness more prominent to his LE, lymphedema present Neurological- alert and oriented to person, place and month but not to year Skin- warm and dry, stage II pressure ulcer to right inner buttock, left lateral tibial area with skin tear with right wound bed Psychiatry- normal mood and affect    Labs reviewed: Basic Metabolic Panel:  Recent Labs  09/16/16 0548 09/17/16 0533 09/18/16 0534 09/20/16  NA 138 138 138 139  K  4.5 4.6 4.9 4.6  CL 103 101 101  --   CO2 25 27 26   --   GLUCOSE 143* 140* 138*  --   BUN 56* 52* 47* 45*  CREATININE 3.18* 3.30* 3.20* 3.6*  CALCIUM 8.1* 8.2* 8.2*  --   MG 1.5* 1.4* 1.4*  --   PHOS 4.7* 4.1 3.9  --    Liver Function Tests:  Recent Labs  09/16/16 0548 09/17/16 0533 09/18/16 0534  AST 10* 12* 14*  ALT 9* 10* 11*  ALKPHOS 51 55 56  BILITOT 0.6 0.6 1.0  PROT 6.4* 6.5 6.4*  ALBUMIN 2.7* 3.1* 3.0*    Recent Labs  12/04/15 1431  LIPASE 30   No results for input(s): AMMONIA in the last 8760 hours. CBC:  Recent Labs  09/16/16 0548 09/17/16 0533 09/18/16 0534 09/20/16  WBC 8.5 7.8 9.5 8.7  NEUTROABS 4.3 4.1 5.1  --   HGB 7.9* 8.2* 8.4* 8.2*  HCT 26.0* 26.8* 27.7* 28*  MCV 77.4* 77.9* 77.6*  --   PLT 203 215  260 250   Cardiac Enzymes:  Recent Labs  09/06/16 1052 09/09/16 1728 09/10/16 0820  TROPONINI 0.03* <0.03 <0.03   BNP: Invalid input(s): POCBNP CBG:  Recent Labs  09/17/16 2157 09/18/16 0735 09/18/16 1200  GLUCAP 146* 153* 154*    Radiological Exams: Ct Abdomen Pelvis Wo Contrast  Result Date: 09/10/2016 CLINICAL DATA:  CHF.  Leukocytosis. EXAM: CT CHEST, ABDOMEN AND PELVIS WITHOUT CONTRAST TECHNIQUE: Multidetector CT imaging of the chest, abdomen and pelvis was performed following the standard protocol without IV contrast. COMPARISON:  Chest CT 08/29/2015 FINDINGS: CT CHEST FINDINGS Cardiovascular: Chronic cardiomegaly. There is a moderate pericardial effusion that is new from echocardiogram report 09/06/2016. Some of the fat around the effusion is infiltrated and pericarditis is considered. No convincing ventricular wall or septal distortion from mass-effect. Mediastinum/Nodes: Diffuse atherosclerotic calcification. No noncontrast evidence of acute vascular disease. Lungs/Pleura: Moderate layering pleural effusions, larger on the left. Multi segment atelectasis. Musculoskeletal: No acute or aggressive finding. Exaggerated thoracic kyphosis with spondylosis with multi-level ankylosis. CT ABDOMEN PELVIS FINDINGS Hepatobiliary: No focal liver abnormality.Extensive calcification in the gallbladder fossa. This is likely luminal and from large gallstone rather than mural calcification. No common bile duct dilatation. Pancreas: Unremarkable. Spleen: Unremarkable. Adrenals/Urinary Tract: Negative adrenals. Smooth bilateral renal atrophy. No hydronephrosis. Cortical and hilar cyst at the left inferior pole. The bladder is decompressed around a Foley catheter. Stomach/Bowel:  No obstruction. Status post right hemicolectomy Vascular/Lymphatic: Diffuse atherosclerotic calcification. No mass or adenopathy. Reproductive:Symmetric prostate enlargement. Other: Small non loculated ascites and Extensive  body wall edema. Small inguinal hernias, fatty on the left and ascitic on the right. Musculoskeletal: No acute abnormalities. Diffuse degenerative change. IMPRESSION: 1. Moderate pericardial effusion not noted on recent echocardiography. Neighboring fat is reticulated and pericarditis should be considered. 2. Volume overload with marked anasarca, moderate bilateral pleural effusion, and small ascites. 3. Multi segment atelectasis. Electronically Signed   By: Monte Fantasia M.D.   On: 09/10/2016 16:53   Dg Chest 2 View  Result Date: 09/10/2016 CLINICAL DATA:  Leukocytosis. EXAM: CHEST  2 VIEW COMPARISON:  09/09/2016. FINDINGS: Cardiomegaly with pulmonary vascular prominence and bilateral pulmonary infiltrates. Bilateral pleural effusions. Findings consistent congestive heart failure. Bilateral pneumonia cannot be excluded. Findings progressed from 09/09/2016. IMPRESSION: Congestive heart failure with bilateral pulmonary edema and bilateral pleural effusions, progressed from prior exam. Bilateral pneumonia cannot be excluded. Electronically Signed   By: Marcello Moores  Register   On: 09/10/2016  14:48   Dg Chest 2 View  Result Date: 09/09/2016 CLINICAL DATA:  Shortness of breath. EXAM: CHEST  2 VIEW COMPARISON:  09/05/2016. FINDINGS: Cardiomegaly with pulmonary vascular prominence and bilateral interstitial prominence with bilateral pleural effusions. Findings consistent with congestive heart failure. No pneumothorax. IMPRESSION: Congestive heart failure with bilateral pulmonary interstitial edema and bilateral pleural effusions. Similar findings noted on prior exam. Electronically Signed   By: Marcello Moores  Register   On: 09/09/2016 09:27   Dg Chest 2 View  Result Date: 09/05/2016 CLINICAL DATA:  Shortness of Breath EXAM: CHEST  2 VIEW COMPARISON:  July 22, 2016 FINDINGS: There is a left pleural effusion with left base atelectasis. Lungs elsewhere clear. There is cardiomegaly with pulmonary venous hypertension.  There is atherosclerotic calcification in the aorta. No adenopathy. There is degenerative change in each shoulder. IMPRESSION: Small left pleural effusion with left base atelectasis. Lungs elsewhere clear. There is underlying pulmonary vascular congestion. There is aortic atherosclerosis. Electronically Signed   By: Lowella Grip III M.D.   On: 09/05/2016 11:07   Dg Abd 1 View  Result Date: 09/10/2016 CLINICAL DATA:  Leukocytosis. EXAM: ABDOMEN - 1 VIEW COMPARISON:  Ultrasound 09/06/2016.  Ultrasound 11/24/2014. FINDINGS: Soft tissue structures are unremarkable. Surgical sutures noted over the right abdomen. Rounded calcification right upper quadrant consistent with cholelithiasis. Mildly prominent air -filled loops of small and large bowel noted. Adynamic ileus cannot be excluded. No free air. No acute bony or joint abnormality identified. Vascular calcification. IMPRESSION: 1. Mildly prominent air-filled loops of small and large bowel noted. Mild adynamic ileus cannot be excluded. Follow-up abdominal series to exclude bowel obstruction. 2. Prominent calcific density right upper quadrant most likely large gallstone. Electronically Signed   By: Marcello Moores  Register   On: 09/10/2016 14:52   Ct Chest Wo Contrast  Result Date: 09/10/2016 CLINICAL DATA:  CHF.  Leukocytosis. EXAM: CT CHEST, ABDOMEN AND PELVIS WITHOUT CONTRAST TECHNIQUE: Multidetector CT imaging of the chest, abdomen and pelvis was performed following the standard protocol without IV contrast. COMPARISON:  Chest CT 08/29/2015 FINDINGS: CT CHEST FINDINGS Cardiovascular: Chronic cardiomegaly. There is a moderate pericardial effusion that is new from echocardiogram report 09/06/2016. Some of the fat around the effusion is infiltrated and pericarditis is considered. No convincing ventricular wall or septal distortion from mass-effect. Mediastinum/Nodes: Diffuse atherosclerotic calcification. No noncontrast evidence of acute vascular disease.  Lungs/Pleura: Moderate layering pleural effusions, larger on the left. Multi segment atelectasis. Musculoskeletal: No acute or aggressive finding. Exaggerated thoracic kyphosis with spondylosis with multi-level ankylosis. CT ABDOMEN PELVIS FINDINGS Hepatobiliary: No focal liver abnormality.Extensive calcification in the gallbladder fossa. This is likely luminal and from large gallstone rather than mural calcification. No common bile duct dilatation. Pancreas: Unremarkable. Spleen: Unremarkable. Adrenals/Urinary Tract: Negative adrenals. Smooth bilateral renal atrophy. No hydronephrosis. Cortical and hilar cyst at the left inferior pole. The bladder is decompressed around a Foley catheter. Stomach/Bowel:  No obstruction. Status post right hemicolectomy Vascular/Lymphatic: Diffuse atherosclerotic calcification. No mass or adenopathy. Reproductive:Symmetric prostate enlargement. Other: Small non loculated ascites and Extensive body wall edema. Small inguinal hernias, fatty on the left and ascitic on the right. Musculoskeletal: No acute abnormalities. Diffuse degenerative change. IMPRESSION: 1. Moderate pericardial effusion not noted on recent echocardiography. Neighboring fat is reticulated and pericarditis should be considered. 2. Volume overload with marked anasarca, moderate bilateral pleural effusion, and small ascites. 3. Multi segment atelectasis. Electronically Signed   By: Monte Fantasia M.D.   On: 09/10/2016 16:53   US Renal  Result Date: 09/06/2016  CLINICAL DATA:  Acute kidney injury, rising BUN and creatinine EXAM: RENAL / URINARY TRACT ULTRASOUND COMPLETE COMPARISON:  11/28/2014 FINDINGS: Right Kidney: Length: 10.2 cm. Echogenicity within normal limits. Thinning of the renal cortex. No hydronephrosis or mass. Left Kidney: Length: 10.7 cm. Echogenicity within normal limits. Limited visualization of the left kidney. Hypoechoic area seen on the prior ultrasound is not visualized. Bladder: Foley catheter  is present.  Bladder empty. IMPRESSION: 1. Suboptimal visualization of left kidney. 2. No hydronephrosis 3. With thinning of the renal cortex consistent . Electronically Signed   By: Donavan Foil M.D.   On: 09/06/2016 22:24     Assessment/Plan   Physical deconditioning With generalized weakness. Will need to work with physical therapy and occupational therapy to help regain and restore his strength and balance. He is high readmission risk given his multiple comorbidities and deconditioning.  Acute on diastolic congestive heart failure He remains euvolemic this visit. Seen by cardiology in the hospital. Will get his weight checked daily. Continue hydralazine 75 mg 3 times a day, Lasix 120 mg twice a day. Patient to follow with cardiology.  Mobitz second-degree AV block Off beta blocker. No indication for pacemaker at present. To follow with cardiology.  Atrial fibrillation Beta blocker was held because of AV block. Continue apixaban for anticoagulation.  Diaper rash Continue nystatin powder twice a day for 2 weeks and monitor. Provide skin care.  Stage II pressure ulcer Will need to provide wound care-inferior with normal saline, pat dry, skin prep to periwound area, apply hydrocolloid to wound base, cover and secure with ordered for dressing and change this every other day.. Pressure ulcer prophylaxis to be taken.  Dysuria Had acute urinary retention in the hospital and required Foley catheter. Has been making urine at present and 40 catheter has been removed. Send urine sample for study to rule out infection.  Constipation Currently on Dulcolax 5 mg daily as needed. D/c dulcolax, start senna s 2 tab qhs and miralax daily  Anemia of chronic disease Monitor CBC. Continue Aranesp once a week  Chronic kidney disease stage IV With acute renal failure in the hospital. Seen by nephrology. Will need outpatient follow-up.  Chronic lymphedema Currently on Norco 5-325 mg 1-2 tablet every  6 hours as needed for pain. To provide Profore wrap to both legs 3 times a week  Type 2 diabetes mellitus with nephropathy Monitor blood sugar reading. Currently on Humalog with meals and bedtime. A1c is suggestive of controlled diabetes. Provide Humalog at bedtime only for blood sugar greater than 150. Lab Results  Component Value Date   HGBA1C 6.4 (H) 09/06/2016      Goals of care: short term rehabilitation   Labs/tests ordered: cbc, bmp  Family/ staff Communication: reviewed care plan with patient and nursing supervisor    Blanchie Serve, MD Internal Medicine [place], [place] [address] Cell Phone (Monday-Friday 8 am - 5 pm): [phone] On Call: [phone] and follow prompts after 5 pm and on weekends Office Phone: [phone] Office Fax: [phone]

## 2016-10-03 ENCOUNTER — Non-Acute Institutional Stay (SKILLED_NURSING_FACILITY): Payer: Medicare HMO | Admitting: Adult Health

## 2016-10-03 ENCOUNTER — Encounter: Payer: Self-pay | Admitting: Adult Health

## 2016-10-03 DIAGNOSIS — R5381 Other malaise: Secondary | ICD-10-CM

## 2016-10-03 DIAGNOSIS — N184 Chronic kidney disease, stage 4 (severe): Secondary | ICD-10-CM

## 2016-10-03 DIAGNOSIS — I480 Paroxysmal atrial fibrillation: Secondary | ICD-10-CM

## 2016-10-03 DIAGNOSIS — N183 Chronic kidney disease, stage 3 unspecified: Secondary | ICD-10-CM

## 2016-10-03 DIAGNOSIS — D631 Anemia in chronic kidney disease: Secondary | ICD-10-CM

## 2016-10-03 DIAGNOSIS — I5033 Acute on chronic diastolic (congestive) heart failure: Secondary | ICD-10-CM

## 2016-10-03 DIAGNOSIS — E785 Hyperlipidemia, unspecified: Secondary | ICD-10-CM | POA: Diagnosis not present

## 2016-10-03 DIAGNOSIS — L89312 Pressure ulcer of right buttock, stage 2: Secondary | ICD-10-CM

## 2016-10-03 DIAGNOSIS — L8942 Pressure ulcer of contiguous site of back, buttock and hip, stage 2: Secondary | ICD-10-CM

## 2016-10-03 DIAGNOSIS — E1121 Type 2 diabetes mellitus with diabetic nephropathy: Secondary | ICD-10-CM

## 2016-10-03 NOTE — Progress Notes (Signed)
DATE:  10/03/2016   MRN:  [id number]  BIRTHDAY: 27-Dec-1933  Facility:  [facility] Home Location:  [place] Room Number: 403-B  LEVEL OF CARE:  SNF (31)  Contact Information    Name Relation Home Work Mobile   [place] Daughter [phone]  [phone]   Salvatore Marvel [phone]         Code Status History    Date Active Date Inactive Code Status Order ID Comments User Context   09/05/2016  7:58 PM 09/18/2016  6:28 PM Full Code [id number]  Barton Dubois, MD Inpatient   12/08/2015  4:00 PM 12/12/2015  5:44 PM Full Code [id number]  Radene Gunning, NP ED   12/08/2015  3:34 PM 12/08/2015  4:00 PM Full Code [id number]  Radene Gunning, NP ED   08/28/2015  5:31 PM 09/02/2015  5:00 PM Full Code [id number]  Radene Gunning, NP Inpatient   07/31/2015  1:33 PM 08/05/2015  7:40 PM Full Code [id number]  Robbie Lis, MD ED   11/23/2014  6:07 PM 11/29/2014  5:11 PM Full Code [id number]  Reyne Dumas, MD ED   02/12/2014  3:13 AM 02/25/2014  2:13 PM Full Code [id number]  Toy Baker, MD Inpatient   11/12/2013  8:24 PM 11/17/2013  9:20 PM Full Code [id number]  Theressa Millard, MD Inpatient   07/21/2013 11:40 AM 07/25/2013  7:23 PM Full Code [id number]  Verlee Monte, MD Inpatient       Chief Complaint  Patient presents with  . Discharge Note    HISTORY OF PRESENT ILLNESS:  This is an 82-YO male who is for discharge to [facility] on 10/07/16 with Home health PT, OT, CNA and Nursing.  He was admitted to [facility] on 09/18/2016 for short-term rehabilitation following an admission at [facility] 09/05/2016-09/18/2016 for SOB, worsening LE edema, and inability to urinate.  BNP on admission 557, chest x-ray showed small left pleural effusion with primary congestion. Cardiology was consulted and repeated ECHO showed IVC is dilated possibly due to volume overload. He was diuresed with Lasix 120 mg twice a day. Currently not on ACEi or ARB due to renal  function. Acute renal failure was taught to be due to poor perfusion from CHF exacerbation and likely from decreased BP plus component of urinary retention most likely from BPH. Creatinine noted to be trending down from 3.67 to 3.30, with baseline 2.6-2.9 .  Nephrology was consulted and was placed on Lasix 120 mg twice a day.  He has PMH of chronic diastolic heart failure, chronic kidney disease, stage III, obesity, lower extremity lymphedema, CAD, hypertension, atrial fibrillation on Eliquis, hyperlipidemia and diabetes mellitus with nephropathy.  He was recently treated with Ceftriaxone IM X 5 days for UTI.   Patient was admitted to this facility for short-term rehabilitation after the patient's recent hospitalization.  Patient has completed SNF rehabilitation and therapy has cleared the patient for discharge.   PAST MEDICAL HISTORY:  Past Medical History:  Diagnosis Date  . Atrial fibrillation ([place])   . CHF (congestive heart failure) ([place])   . Chronic kidney disease   . Coronary artery disease   . Diabetes mellitus without complication ([place])   . Hypertension      CURRENT MEDICATIONS: Reviewed  Patient's Medications  New Prescriptions   No medications on file  Previous Medications   APIXABAN (ELIQUIS) 2.5 MG TABS TABLET    Take 1 tablet (2.5 mg total) by mouth 2 (  two) times daily.   ATORVASTATIN (LIPITOR) 20 MG TABLET    Take 1 tablet (20 mg total) by mouth daily at 6 PM.   DARBEPOETIN ALFA (ARANESP) 150 MCG/0.3ML SOSY INJECTION    Inject 0.3 mLs (150 mcg total) into the skin every 7 (seven) days.   FUROSEMIDE (LASIX) 40 MG TABLET    Take 3 tablets (120 mg total) by mouth 2 (two) times daily.   HYDRALAZINE (APRESOLINE) 25 MG TABLET    Take 3 tablets (75 mg total) by mouth every 8 (eight) hours.   HYDROCODONE-ACETAMINOPHEN (NORCO/VICODIN) 5-325 MG TABLET    Take 1-2 tablets by mouth every 6 (six) hours as needed for moderate pain or severe pain.   INSULIN LISPRO (HUMALOG) 100  UNIT/ML INJECTION    Inject 5 Units into the skin at bedtime.   INSULIN LISPRO (HUMALOG) 100 UNIT/ML INJECTION    Inject 5-10 Units into the skin 3 (three) times daily with meals. SSI as follows:  150-200 = 5 units, 201-300 = 8 units, 301-400 = 10 units, CBG >400 call provider   NUTRITIONAL SUPPLEMENT LIQD    Take 120 mLs by mouth 2 (two) times daily.   POLYETHYLENE GLYCOL (MIRALAX / GLYCOLAX) PACKET    Take 17 g by mouth daily.   SACCHAROMYCES BOULARDII (FLORASTOR) 250 MG CAPSULE    Take 250 mg by mouth 2 (two) times daily.   SENNOSIDES-DOCUSATE SODIUM (SENOKOT-S) 8.6-50 MG TABLET    Take 2 tablets by mouth at bedtime.  Modified Medications   No medications on file  Discontinued Medications   BISACODYL (DULCOLAX) 5 MG EC TABLET    Take 1 tablet (5 mg total) by mouth daily as needed for moderate constipation.   INSULIN ASPART (NOVOLOG) 100 UNIT/ML INJECTION    Inject 0-9 Units into the skin 3 (three) times daily with meals.   INSULIN ASPART (NOVOLOG) 100 UNIT/ML INJECTION    Inject 0-5 Units into the skin at bedtime.   NYSTATIN (MYCOSTATIN/NYSTOP) POWDER    Apply 1 g topically 2 (two) times daily. For 14 days     Allergies  Allergen Reactions  . Metformin And Related     Reaction: pt states he thinks he threw up     REVIEW OF SYSTEMS:  GENERAL: no change in appetite, no fatigue, no weight changes, no fever, chills or weakness EYES: Denies change in vision, dry eyes, eye pain, itching or discharge EARS: Denies change in hearing, ringing in ears, or earache NOSE: Denies nasal congestion or epistaxis MOUTH and THROAT: Denies oral discomfort, gingival pain or bleeding, pain from teeth or hoarseness   RESPIRATORY: no cough, SOB, DOE, wheezing, hemoptysis CARDIAC: no chest pain, edema or palpitations GI: no abdominal pain, diarrhea, constipation, heart burn, nausea or vomiting GU: Denies dysuria, frequency, hematuria, incontinence, or discharge PSYCHIATRIC: Denies feeling of depression or  anxiety. No report of hallucinations, insomnia, paranoia, or agitation   PHYSICAL EXAMINATION  GENERAL APPEARANCE: Well nourished. In no acute distress. Normal body habitus SKIN:  Right inner buttock stage 2 pressure ulcer, bilateral lower legs wrapped with unna boots HEAD: Normal in size and contour. No evidence of trauma EYES: Lids open and close normally. No blepharitis, entropion or ectropion. PERRL. Conjunctivae are clear and sclerae are white. Lenses are without opacity EARS: Pinnae are normal. Patient hears normal voice tunes of the examiner MOUTH and THROAT: Lips are without lesions. Oral mucosa is moist and without lesions. Tongue is normal in shape, size, and color and without lesions NECK: supple,  trachea midline, no neck masses, no thyroid tenderness, no thyromegaly LYMPHATICS: no LAN in the neck, no supraclavicular LAN RESPIRATORY: breathing is even & unlabored, BS CTAB CARDIAC: RRR, no murmur,no extra heart sounds, no edema GI: abdomen soft, normal BS, no masses, no tenderness, no hepatomegaly, no splenomegaly EXTREMITIES:  Able to move 4 extremities PSYCHIATRIC: Alert and oriented to time and person, disoriented to place and she will. Affect and behavior are appropriate    LABS/RADIOLOGY: Labs reviewed: 09/27/16  WBC 7.3 hemoglobin 8.5 hematocrit 28.3 MCV 75.5 platelet 209 sodium 140 K4.1 glucose 103 BUN 43 creatinine 3.07 calcium 8.1 GFR 0000000 Basic Metabolic Panel:  Recent Labs  09/16/16 0548 09/17/16 0533 09/18/16 0534 09/20/16  NA 138 138 138 139  K 4.5 4.6 4.9 4.6  CL 103 101 101  --   CO2 25 27 26   --   GLUCOSE 143* 140* 138*  --   BUN 56* 52* 47* 45*  CREATININE 3.18* 3.30* 3.20* 3.6*  CALCIUM 8.1* 8.2* 8.2*  --   MG 1.5* 1.4* 1.4*  --   PHOS 4.7* 4.1 3.9  --    Liver Function Tests:  Recent Labs  09/16/16 0548 09/17/16 0533 09/18/16 0534  AST 10* 12* 14*  ALT 9* 10* 11*  ALKPHOS 51 55 56  BILITOT 0.6 0.6 1.0  PROT 6.4* 6.5 6.4*  ALBUMIN 2.7*  3.1* 3.0*    Recent Labs  12/04/15 1431  LIPASE 30   CBC:  Recent Labs  09/16/16 0548 09/17/16 0533 09/18/16 0534 09/20/16  WBC 8.5 7.8 9.5 8.7  NEUTROABS 4.3 4.1 5.1  --   HGB 7.9* 8.2* 8.4* 8.2*  HCT 26.0* 26.8* 27.7* 28*  MCV 77.4* 77.9* 77.6*  --   PLT 203 215 260 250   Lipid Panel:  Recent Labs  12/08/15 1713 12/09/15 0717  HDL 33* 25*   Cardiac Enzymes:  Recent Labs  09/06/16 1052 09/09/16 1728 09/10/16 0820  TROPONINI 0.03* <0.03 <0.03   CBG:  Recent Labs  09/17/16 2157 09/18/16 0735 09/18/16 1200  GLUCAP 146* 153* 154*      Ct Abdomen Pelvis Wo Contrast  Result Date: 09/10/2016 CLINICAL DATA:  CHF.  Leukocytosis. EXAM: CT CHEST, ABDOMEN AND PELVIS WITHOUT CONTRAST TECHNIQUE: Multidetector CT imaging of the chest, abdomen and pelvis was performed following the standard protocol without IV contrast. COMPARISON:  Chest CT 08/29/2015 FINDINGS: CT CHEST FINDINGS Cardiovascular: Chronic cardiomegaly. There is a moderate pericardial effusion that is new from echocardiogram report 09/06/2016. Some of the fat around the effusion is infiltrated and pericarditis is considered. No convincing ventricular wall or septal distortion from mass-effect. Mediastinum/Nodes: Diffuse atherosclerotic calcification. No noncontrast evidence of acute vascular disease. Lungs/Pleura: Moderate layering pleural effusions, larger on the left. Multi segment atelectasis. Musculoskeletal: No acute or aggressive finding. Exaggerated thoracic kyphosis with spondylosis with multi-level ankylosis. CT ABDOMEN PELVIS FINDINGS Hepatobiliary: No focal liver abnormality.Extensive calcification in the gallbladder fossa. This is likely luminal and from large gallstone rather than mural calcification. No common bile duct dilatation. Pancreas: Unremarkable. Spleen: Unremarkable. Adrenals/Urinary Tract: Negative adrenals. Smooth bilateral renal atrophy. No hydronephrosis. Cortical and hilar cyst at the  left inferior pole. The bladder is decompressed around a Foley catheter. Stomach/Bowel:  No obstruction. Status post right hemicolectomy Vascular/Lymphatic: Diffuse atherosclerotic calcification. No mass or adenopathy. Reproductive:Symmetric prostate enlargement. Other: Small non loculated ascites and Extensive body wall edema. Small inguinal hernias, fatty on the left and ascitic on the right. Musculoskeletal: No acute abnormalities. Diffuse degenerative change. IMPRESSION: 1.  Moderate pericardial effusion not noted on recent echocardiography. Neighboring fat is reticulated and pericarditis should be considered. 2. Volume overload with marked anasarca, moderate bilateral pleural effusion, and small ascites. 3. Multi segment atelectasis. Electronically Signed   By: Monte Fantasia M.D.   On: 09/10/2016 16:53   Dg Chest 2 View  Result Date: 09/10/2016 CLINICAL DATA:  Leukocytosis. EXAM: CHEST  2 VIEW COMPARISON:  09/09/2016. FINDINGS: Cardiomegaly with pulmonary vascular prominence and bilateral pulmonary infiltrates. Bilateral pleural effusions. Findings consistent congestive heart failure. Bilateral pneumonia cannot be excluded. Findings progressed from 09/09/2016. IMPRESSION: Congestive heart failure with bilateral pulmonary edema and bilateral pleural effusions, progressed from prior exam. Bilateral pneumonia cannot be excluded. Electronically Signed   By: Marcello Moores  Register   On: 09/10/2016 14:48   Dg Chest 2 View  Result Date: 09/09/2016 CLINICAL DATA:  Shortness of breath. EXAM: CHEST  2 VIEW COMPARISON:  09/05/2016. FINDINGS: Cardiomegaly with pulmonary vascular prominence and bilateral interstitial prominence with bilateral pleural effusions. Findings consistent with congestive heart failure. No pneumothorax. IMPRESSION: Congestive heart failure with bilateral pulmonary interstitial edema and bilateral pleural effusions. Similar findings noted on prior exam. Electronically Signed   By: Marcello Moores  Register    On: 09/09/2016 09:27   Dg Chest 2 View  Result Date: 09/05/2016 CLINICAL DATA:  Shortness of Breath EXAM: CHEST  2 VIEW COMPARISON:  July 22, 2016 FINDINGS: There is a left pleural effusion with left base atelectasis. Lungs elsewhere clear. There is cardiomegaly with pulmonary venous hypertension. There is atherosclerotic calcification in the aorta. No adenopathy. There is degenerative change in each shoulder. IMPRESSION: Small left pleural effusion with left base atelectasis. Lungs elsewhere clear. There is underlying pulmonary vascular congestion. There is aortic atherosclerosis. Electronically Signed   By: Lowella Grip III M.D.   On: 09/05/2016 11:07   Dg Abd 1 View  Result Date: 09/10/2016 CLINICAL DATA:  Leukocytosis. EXAM: ABDOMEN - 1 VIEW COMPARISON:  Ultrasound 09/06/2016.  Ultrasound 11/24/2014. FINDINGS: Soft tissue structures are unremarkable. Surgical sutures noted over the right abdomen. Rounded calcification right upper quadrant consistent with cholelithiasis. Mildly prominent air -filled loops of small and large bowel noted. Adynamic ileus cannot be excluded. No free air. No acute bony or joint abnormality identified. Vascular calcification. IMPRESSION: 1. Mildly prominent air-filled loops of small and large bowel noted. Mild adynamic ileus cannot be excluded. Follow-up abdominal series to exclude bowel obstruction. 2. Prominent calcific density right upper quadrant most likely large gallstone. Electronically Signed   By: Marcello Moores  Register   On: 09/10/2016 14:52   Ct Chest Wo Contrast  Result Date: 09/10/2016 CLINICAL DATA:  CHF.  Leukocytosis. EXAM: CT CHEST, ABDOMEN AND PELVIS WITHOUT CONTRAST TECHNIQUE: Multidetector CT imaging of the chest, abdomen and pelvis was performed following the standard protocol without IV contrast. COMPARISON:  Chest CT 08/29/2015 FINDINGS: CT CHEST FINDINGS Cardiovascular: Chronic cardiomegaly. There is a moderate pericardial effusion that is new  from echocardiogram report 09/06/2016. Some of the fat around the effusion is infiltrated and pericarditis is considered. No convincing ventricular wall or septal distortion from mass-effect. Mediastinum/Nodes: Diffuse atherosclerotic calcification. No noncontrast evidence of acute vascular disease. Lungs/Pleura: Moderate layering pleural effusions, larger on the left. Multi segment atelectasis. Musculoskeletal: No acute or aggressive finding. Exaggerated thoracic kyphosis with spondylosis with multi-level ankylosis. CT ABDOMEN PELVIS FINDINGS Hepatobiliary: No focal liver abnormality.Extensive calcification in the gallbladder fossa. This is likely luminal and from large gallstone rather than mural calcification. No common bile duct dilatation. Pancreas: Unremarkable. Spleen: Unremarkable. Adrenals/Urinary Tract: Negative  adrenals. Smooth bilateral renal atrophy. No hydronephrosis. Cortical and hilar cyst at the left inferior pole. The bladder is decompressed around a Foley catheter. Stomach/Bowel:  No obstruction. Status post right hemicolectomy Vascular/Lymphatic: Diffuse atherosclerotic calcification. No mass or adenopathy. Reproductive:Symmetric prostate enlargement. Other: Small non loculated ascites and Extensive body wall edema. Small inguinal hernias, fatty on the left and ascitic on the right. Musculoskeletal: No acute abnormalities. Diffuse degenerative change. IMPRESSION: 1. Moderate pericardial effusion not noted on recent echocardiography. Neighboring fat is reticulated and pericarditis should be considered. 2. Volume overload with marked anasarca, moderate bilateral pleural effusion, and small ascites. 3. Multi segment atelectasis. Electronically Signed   By: Monte Fantasia M.D.   On: 09/10/2016 16:53   US Renal  Result Date: 09/06/2016 CLINICAL DATA:  Acute kidney injury, rising BUN and creatinine EXAM: RENAL / URINARY TRACT ULTRASOUND COMPLETE COMPARISON:  11/28/2014 FINDINGS: Right Kidney:  Length: 10.2 cm. Echogenicity within normal limits. Thinning of the renal cortex. No hydronephrosis or mass. Left Kidney: Length: 10.7 cm. Echogenicity within normal limits. Limited visualization of the left kidney. Hypoechoic area seen on the prior ultrasound is not visualized. Bladder: Foley catheter is present.  Bladder empty. IMPRESSION: 1. Suboptimal visualization of left kidney. 2. No hydronephrosis 3. With thinning of the renal cortex consistent . Electronically Signed   By: Donavan Foil M.D.   On: 09/06/2016 22:24    ASSESSMENT/PLAN:  Physical deconditioning - for Home health CNA, Nursing, PT and OT, for therapeutic strengthening exercises; fall precautions  Acute on chronic diastolic heart failure - echocardiogram showed EF 55-60%; follow-up with cardiology;  continue Lasix 120 mg by mouth twice a day, currently in no ACEi or ARB due to renal function; ; fluid restrictions of 1500 mL/day  Chronic kidney disease, stage IV -  Creatinine 3.07; follow-up with nephrology   Anemia of chronic renal disease - hgb 8.5; continue Aranesp 150 g subcutaneous every week; follow-up with nephrology, schedule for an appointment for Aranesp injection  Diabetes mellitus, type II  - continue NovoLog sliding scale TID and at bedtime and Humalog 100 units/ml give 5 units SQ Q HS Lab Results  Component Value Date   HGBA1C 6.4 (H) 09/06/2016   Paroxysmal atrial fibrillation - cardiology does not feel patient needing pacemaker at this time; continue Eliquis 2.5 mg 1 tab by mouth twice a day; follow-up with cardiology  Essential hypertension - well-controlled; continue hydralazine 25 mg take 3 tabs = 75 mg by mouth every 8 hours  Hyperlipidemia - continue Lipitor 20 mg 1 tab by mouth every 6 p.m.  Right buttock pressure ulcer, stage II - continue wound treatment, keep skin clean and dry    I have filled out patient's discharge paperwork and written prescriptions.  Patient will receive home health PT,  OT, Nursing and CNA.  DME provided: None  Total discharge time: Greater than 30 minutes Greater than 50% was spent in counseling and coordination of care with the patient.    Discharge time involved coordination of the discharge process with social worker, nursing staff and therapy department. Medical justification for home health services verified.     Juventino Pavone C. [place] - NP [employer] [phone]

## 2016-11-05 ENCOUNTER — Emergency Department (HOSPITAL_COMMUNITY)
Admission: EM | Admit: 2016-11-05 | Discharge: 2016-11-05 | Disposition: A | Discharge status: Home / Self Care | Payer: Medicare HMO | Attending: Emergency Medicine | Admitting: Emergency Medicine

## 2016-11-05 ENCOUNTER — Encounter (HOSPITAL_COMMUNITY): Payer: Self-pay | Admitting: Emergency Medicine

## 2016-11-05 DIAGNOSIS — M79661 Pain in right lower leg: Secondary | ICD-10-CM | POA: Diagnosis present

## 2016-11-05 DIAGNOSIS — I2510 Atherosclerotic heart disease of native coronary artery without angina pectoris: Secondary | ICD-10-CM | POA: Diagnosis not present

## 2016-11-05 DIAGNOSIS — E1121 Type 2 diabetes mellitus with diabetic nephropathy: Secondary | ICD-10-CM | POA: Insufficient documentation

## 2016-11-05 DIAGNOSIS — I5032 Chronic diastolic (congestive) heart failure: Secondary | ICD-10-CM | POA: Insufficient documentation

## 2016-11-05 DIAGNOSIS — N183 Chronic kidney disease, stage 3 (moderate): Secondary | ICD-10-CM | POA: Insufficient documentation

## 2016-11-05 DIAGNOSIS — Z7901 Long term (current) use of anticoagulants: Secondary | ICD-10-CM | POA: Diagnosis not present

## 2016-11-05 DIAGNOSIS — E1122 Type 2 diabetes mellitus with diabetic chronic kidney disease: Secondary | ICD-10-CM | POA: Insufficient documentation

## 2016-11-05 DIAGNOSIS — Z87891 Personal history of nicotine dependence: Secondary | ICD-10-CM | POA: Diagnosis not present

## 2016-11-05 DIAGNOSIS — I872 Venous insufficiency (chronic) (peripheral): Secondary | ICD-10-CM

## 2016-11-05 DIAGNOSIS — Z8673 Personal history of transient ischemic attack (TIA), and cerebral infarction without residual deficits: Secondary | ICD-10-CM | POA: Diagnosis not present

## 2016-11-05 DIAGNOSIS — Z794 Long term (current) use of insulin: Secondary | ICD-10-CM | POA: Insufficient documentation

## 2016-11-05 DIAGNOSIS — I130 Hypertensive heart and chronic kidney disease with heart failure and stage 1 through stage 4 chronic kidney disease, or unspecified chronic kidney disease: Secondary | ICD-10-CM | POA: Insufficient documentation

## 2016-11-05 DIAGNOSIS — Z79899 Other long term (current) drug therapy: Secondary | ICD-10-CM | POA: Insufficient documentation

## 2016-11-05 LAB — URINALYSIS, ROUTINE W REFLEX MICROSCOPIC
Bilirubin Urine: NEGATIVE
GLUCOSE, UA: NEGATIVE mg/dL
HGB URINE DIPSTICK: NEGATIVE
Ketones, ur: NEGATIVE mg/dL
LEUKOCYTES UA: NEGATIVE
NITRITE: NEGATIVE
Protein, ur: 30 mg/dL — AB
SPECIFIC GRAVITY, URINE: 1.01 (ref 1.005–1.030)
pH: 5 (ref 5.0–8.0)

## 2016-11-05 MED ORDER — ACETAMINOPHEN 500 MG PO TABS
1000.0000 mg | ORAL_TABLET | Freq: Once | ORAL | Status: AC
  Administered 2016-11-05: 1000 mg via ORAL
  Filled 2016-11-05: qty 2

## 2016-11-05 NOTE — ED Provider Notes (Signed)
[facility] DEPT Provider Note   CSN: [id number] Arrival date & time: 11/05/16  1620     History   Chief Complaint Chief Complaint  Patient presents with  . Leg Pain    bilat    HPI [name] is a 83 y.o. male.  The history is provided by the patient.  Leg Pain   This is a chronic problem. The current episode started more than 1 week ago. The problem occurs constantly. The problem has not changed since onset.Pain location: bilateral lower legs. The pain is moderate. Pertinent negatives include no numbness and no stiffness. Associated symptoms comments: Swelling and pain. He has tried nothing for the symptoms. There has been no history of extremity trauma. venous stasis    Past Medical History:  Diagnosis Date  . Atrial fibrillation ([place])   . CHF (congestive heart failure) ([place])   . Chronic kidney disease   . Coronary artery disease   . Diabetes mellitus without complication ([place])   . Hypertension     Patient Active Problem List   Diagnosis Date Noted  . Pressure injury of skin 09/18/2016  . Leukocytosis   . Mobitz type 1 second degree AV block 09/11/2016  . Pericardial effusion- moderate on CT scan 09/11/2016  . AKI (acute kidney injury) ([place]) 09/05/2016  . HTN (hypertension) 09/05/2016  . Acute urinary retention 09/05/2016  . Severe obesity (BMI >= 40) ([place]) 09/05/2016  . Acute on chronic diastolic congestive heart failure ([place]) 09/05/2016  . Acute CVA (cerebrovascular accident) ([place]) 12/09/2015  . Bradycardia 12/08/2015  . Fall 12/08/2015  . Anemia 12/08/2015  . Atrial flutter ([place]) 08/29/2015  . Acute respiratory failure ([place]) 08/29/2015  . Hypoxia 08/28/2015  . PAF (paroxysmal atrial fibrillation) ([place])   . Noncompliance with medications 08/03/2015  . Chronic diastolic CHF (congestive heart failure) ([place]) 07/31/2015  . Cellulitis of left lower extremity 07/31/2015  . Anemia of chronic renal failure, stage 3 (moderate) 07/31/2015  . Uncontrolled  diabetes mellitus with diabetic nephropathy, with long-term current use of insulin ([place]) 07/31/2015  . Accelerated hypertension 07/31/2015  . Thrombocytopenia ([place]) 07/31/2015  . Acute renal failure superimposed on stage 3 chronic kidney disease ([place]) 11/29/2014  . Lymphedema 06/25/2011    Past Surgical History:  Procedure Laterality Date  . COLONOSCOPY  06/21/2012   Procedure: COLONOSCOPY;  Surgeon: Juanita Craver, MD;  Location: [facility] ENDOSCOPY;  Service: Endoscopy;  Laterality: N/A;  . COLONOSCOPY Left 02/13/2014   Procedure: COLONOSCOPY;  Surgeon: Juanita Craver, MD;  Location[facility] ENDOSCOPY;  Service: Endoscopy;  Laterality: Left;  . ESOPHAGOGASTRODUODENOSCOPY  06/19/2012   Procedure: ESOPHAGOGASTRODUODENOSCOPY (EGD);  Surgeon: Beryle Beams, MD;  Location: [facility] ENDOSCOPY;  Service: Endoscopy;  Laterality: N/A;  . LAPAROSCOPIC RIGHT COLECTOMY Right 02/15/2014   Procedure: LAPAROSCOPIC ASISSTED RIGHT  COLECTOMY, POSSIBLE OPEN;  Surgeon: Rolm Bookbinder, MD;  Location: [facility];  Service: General;  Laterality: Right;  . LEG SURGERY     Left leg surgery. broken leg       Home Medications    Prior to Admission medications   Medication Sig Start Date End Date Taking? Authorizing Provider  apixaban (ELIQUIS) 2.5 MG TABS tablet Take 1 tablet (2.5 mg total) by mouth 2 (two) times daily. 12/12/15   Ripudeep Krystal Eaton, MD  atorvastatin (LIPITOR) 20 MG tablet Take 1 tablet (20 mg total) by mouth daily at 6 PM. 12/12/15   Ripudeep K Rai, MD  Darbepoetin Alfa (ARANESP) 150 MCG/0.3ML SOSY injection Inject 0.3 mLs (150 mcg total) into  the skin every 7 (seven) days. 09/18/16   Eber Jones, MD  furosemide (LASIX) 40 MG tablet Take 3 tablets (120 mg total) by mouth 2 (two) times daily. 09/18/16   Eber Jones, MD  hydrALAZINE (APRESOLINE) 25 MG tablet Take 3 tablets (75 mg total) by mouth every 8 (eight) hours. 09/18/16   Eber Jones, MD  HYDROcodone-acetaminophen (NORCO/VICODIN) 5-325 MG  tablet Take 1-2 tablets by mouth every 6 (six) hours as needed for moderate pain or severe pain. 09/18/16   Eber Jones, MD  insulin lispro (HUMALOG) 100 UNIT/ML injection Inject 5 Units into the skin at bedtime.    Historical Provider, MD  insulin lispro (HUMALOG) 100 UNIT/ML injection Inject 5-10 Units into the skin 3 (three) times daily with meals. SSI as follows:  150-200 = 5 units, 201-300 = 8 units, 301-400 = 10 units, CBG >400 call provider    Historical Provider, MD  NUTRITIONAL SUPPLEMENT LIQD Take 120 mLs by mouth 2 (two) times daily.    Historical Provider, MD  polyethylene glycol (MIRALAX / GLYCOLAX) packet Take 17 g by mouth daily.    Historical Provider, MD  sennosides-docusate sodium (SENOKOT-S) 8.6-50 MG tablet Take 2 tablets by mouth at bedtime.    Historical Provider, MD    Family History Family History  Problem Relation Age of Onset  . Brain cancer Mother   . Diabetes type II Sister   . Narcolepsy Paternal Uncle     Social History Social History  Substance Use Topics  . Smoking status: Former Smoker    Quit date: 08/26/1998  . Smokeless tobacco: Never Used  . Alcohol use No     Comment: in the past     Allergies   Metformin and related   Review of Systems Review of Systems  Musculoskeletal: Negative for stiffness.  Neurological: Negative for numbness.  All other systems reviewed and are negative.    Physical Exam Updated Vital Signs BP (!) 138/53 (BP Location: Right Arm)   Pulse (!) 51   Temp 97.9 F (36.6 C) (Oral)   Resp 18   SpO2 99%   Physical Exam  Constitutional: He is oriented to person, place, and time. He appears well-developed and well-nourished. No distress.  HENT:  Head: Normocephalic and atraumatic.  Nose: Nose normal.  Eyes: Conjunctivae are normal.  Neck: Neck supple. No tracheal deviation present.  Cardiovascular: Normal rate and regular rhythm.   Pulmonary/Chest: Effort normal. No respiratory distress.  Abdominal:  Soft. He exhibits no distension.  Musculoskeletal:  B/l LE with chronic venous stasis 3+ pitting edema and scaling with thinning of skin  Neurological: He is alert and oriented to person, place, and time.  Skin: Skin is warm and dry.  Psychiatric: He has a normal mood and affect.     ED Treatments / Results  Labs (all labs ordered are listed, but only abnormal results are displayed) Labs Reviewed  URINALYSIS, ROUTINE W REFLEX MICROSCOPIC - Abnormal; Notable for the following:       Result Value   Protein, ur 30 (*)    Bacteria, UA RARE (*)    Squamous Epithelial / LPF 0-5 (*)    All other components within normal limits    EKG  EKG Interpretation None       Radiology No results found.  Procedures Procedures (including critical care time)  EMERGENCY DEPARTMENT US EXTREMITY EXAM "Study:  Limited Duplex of right Lower Extremity Veins"  INDICATIONS: Leg pain Visualization of  regions  in transverse plane with full compression visualized.   PERFORMED BY: Myself IMAGES ARCHIVED?: Yes VIEWS USED: Saphenous-femoral junction, Proximal femoral vein and Popliteal vein INTERPRETATION: No DVT visualized  Medications Ordered in ED Medications  acetaminophen (TYLENOL) tablet 1,000 mg (1,000 mg Oral Given 11/05/16 2155)     Initial Impression / Assessment and Plan / ED Course  I have reviewed the triage vital signs and the nursing notes.  Pertinent labs & imaging results that were available during my care of the patient were reviewed by me and considered in my medical decision making (see chart for details).     83 y.o. male presents with pain in both of his legs which have chronic venous stasis and swelling. Per the nursing facility they were dressing his legs today and he had pain when they pushed behind his right knee so they sent him here for evaluation of DVT. Clinical suspicion low, no formal vascular studies available at the time of discussion with facility. limited  study completed here at bedside shows no DVT. Will continue ongoing management with compression therapy and other supportive care measures.   Final Clinical Impressions(s) / ED Diagnoses   Final diagnoses:  Chronic venous stasis dermatitis of both lower extremities    New Prescriptions New Prescriptions   No medications on file     Leo Grosser, MD 11/06/16 [phone]

## 2016-11-05 NOTE — ED Notes (Signed)
PTAR contacted for [facility]; [facility] given report

## 2016-11-05 NOTE — ED Triage Notes (Signed)
Per PTAR patient comes from [facility] for bilat leg pain when SNF staff where wrapping his wounds today.  Patient had some drainage noted to wraps.  Patient also has some swelling. SNF reports to PTAR that patient isnt acting his baseline. Vitals 142/60, 58 HR irregular, 16R, 98%.

## 2016-11-05 NOTE — Discharge Instructions (Signed)
Your leg was negative for DVT on today's visit.

## 2016-11-05 NOTE — ED Notes (Signed)
MD at bedside. 

## 2016-11-08 ENCOUNTER — Emergency Department (HOSPITAL_COMMUNITY)
Admission: EM | Admit: 2016-11-08 | Discharge: 2016-11-08 | Disposition: A | Discharge status: Home / Self Care | Payer: Medicare HMO | Attending: Emergency Medicine | Admitting: Emergency Medicine

## 2016-11-08 ENCOUNTER — Encounter (HOSPITAL_COMMUNITY): Payer: Self-pay | Admitting: Nurse Practitioner

## 2016-11-08 DIAGNOSIS — Z8673 Personal history of transient ischemic attack (TIA), and cerebral infarction without residual deficits: Secondary | ICD-10-CM | POA: Diagnosis not present

## 2016-11-08 DIAGNOSIS — I2510 Atherosclerotic heart disease of native coronary artery without angina pectoris: Secondary | ICD-10-CM | POA: Insufficient documentation

## 2016-11-08 DIAGNOSIS — R600 Localized edema: Secondary | ICD-10-CM | POA: Insufficient documentation

## 2016-11-08 DIAGNOSIS — N183 Chronic kidney disease, stage 3 (moderate): Secondary | ICD-10-CM | POA: Insufficient documentation

## 2016-11-08 DIAGNOSIS — I130 Hypertensive heart and chronic kidney disease with heart failure and stage 1 through stage 4 chronic kidney disease, or unspecified chronic kidney disease: Secondary | ICD-10-CM | POA: Insufficient documentation

## 2016-11-08 DIAGNOSIS — E1122 Type 2 diabetes mellitus with diabetic chronic kidney disease: Secondary | ICD-10-CM | POA: Diagnosis not present

## 2016-11-08 DIAGNOSIS — Z87891 Personal history of nicotine dependence: Secondary | ICD-10-CM | POA: Insufficient documentation

## 2016-11-08 DIAGNOSIS — Z794 Long term (current) use of insulin: Secondary | ICD-10-CM | POA: Diagnosis not present

## 2016-11-08 DIAGNOSIS — Z79899 Other long term (current) drug therapy: Secondary | ICD-10-CM | POA: Insufficient documentation

## 2016-11-08 DIAGNOSIS — I5033 Acute on chronic diastolic (congestive) heart failure: Secondary | ICD-10-CM | POA: Diagnosis not present

## 2016-11-08 NOTE — ED Notes (Signed)
ED Provider at bedside. 

## 2016-11-08 NOTE — ED Triage Notes (Signed)
Per PTAR   Pt from [facility], bilateral leg edema and pain, was seen and ruled out DVT. Pt sts recurrent bilateral leg edema , x 1 month. denies chest pain or shortness of breath. No signs of cellulitis per EMS.

## 2016-11-08 NOTE — Discharge Instructions (Signed)
Please ask your primary care doctor for referral back to the wound care center

## 2016-11-08 NOTE — ED Provider Notes (Signed)
[facility] DEPT Provider Note   CSN: [id number] Arrival date & time: 11/08/16  1209     History   Chief Complaint Chief Complaint  Patient presents with  . Leg Pain    bilateral edema    HPI [name] is a 83 y.o. male.  HPI Patient presents emergency department complaining of ongoing lower extremity edema.  He states his been the issue over the past 17 years.  He has a history lymphedema.  His been intermittently treated by the wound care center with Unna boots.  He is currently not under the care of the wound care center any longer.  He is at an assisted living center.  He was sent to the emergency department for ongoing bilateral lower extremity swelling over the past month.  The patient states that his swelling is consistent over the past month without worsening of his symptoms.  He states is compliant with his medications including his Lasix.  He is currently not very active and not elevating his legs   Past Medical History:  Diagnosis Date  . Atrial fibrillation ([place])   . CHF (congestive heart failure) ([place])   . Chronic kidney disease   . Coronary artery disease   . Diabetes mellitus without complication ([place])   . Hypertension     Patient Active Problem List   Diagnosis Date Noted  . Pressure injury of skin 09/18/2016  . Leukocytosis   . Mobitz type 1 second degree AV block 09/11/2016  . Pericardial effusion- moderate on CT scan 09/11/2016  . AKI (acute kidney injury) ([place]) 09/05/2016  . HTN (hypertension) 09/05/2016  . Acute urinary retention 09/05/2016  . Severe obesity (BMI >= 40) ([place]) 09/05/2016  . Acute on chronic diastolic congestive heart failure ([place]) 09/05/2016  . Acute CVA (cerebrovascular accident) ([place]) 12/09/2015  . Bradycardia 12/08/2015  . Fall 12/08/2015  . Anemia 12/08/2015  . Atrial flutter ([place]) 08/29/2015  . Acute respiratory failure ([place]) 08/29/2015  . Hypoxia 08/28/2015  . PAF (paroxysmal atrial fibrillation) ([place])   .  Noncompliance with medications 08/03/2015  . Chronic diastolic CHF (congestive heart failure) ([place]) 07/31/2015  . Cellulitis of left lower extremity 07/31/2015  . Anemia of chronic renal failure, stage 3 (moderate) 07/31/2015  . Uncontrolled diabetes mellitus with diabetic nephropathy, with long-term current use of insulin ([place]) 07/31/2015  . Accelerated hypertension 07/31/2015  . Thrombocytopenia ([place]) 07/31/2015  . Acute renal failure superimposed on stage 3 chronic kidney disease ([place]) 11/29/2014  . Lymphedema 06/25/2011    Past Surgical History:  Procedure Laterality Date  . COLONOSCOPY  06/21/2012   Procedure: COLONOSCOPY;  Surgeon: Juanita Craver, MD;  Location[facility] ENDOSCOPY;  Service: Endoscopy;  Laterality: N/A;  . COLONOSCOPY Left 02/13/2014   Procedure: COLONOSCOPY;  Surgeon: Juanita Craver, MD;  Location: [facility] ENDOSCOPY;  Service: Endoscopy;  Laterality: Left;  . ESOPHAGOGASTRODUODENOSCOPY  06/19/2012   Procedure: ESOPHAGOGASTRODUODENOSCOPY (EGD);  Surgeon: Beryle Beams, MD;  Location: [facility] ENDOSCOPY;  Service: Endoscopy;  Laterality: N/A;  . LAPAROSCOPIC RIGHT COLECTOMY Right 02/15/2014   Procedure: LAPAROSCOPIC ASISSTED RIGHT  COLECTOMY, POSSIBLE OPEN;  Surgeon: Rolm Bookbinder, MD;  Location: [facility];  Service: General;  Laterality: Right;  . LEG SURGERY     Left leg surgery. broken leg       Home Medications    Prior to Admission medications   Medication Sig Start Date End Date Taking? Authorizing Provider  apixaban (ELIQUIS) 2.5 MG TABS tablet Take 1 tablet (2.5 mg total) by mouth 2 (two) times  daily. 12/12/15   Ripudeep Krystal Eaton, MD  atorvastatin (LIPITOR) 20 MG tablet Take 1 tablet (20 mg total) by mouth daily at 6 PM. 12/12/15   Ripudeep Krystal Eaton, MD  Darbepoetin Alfa (ARANESP) 150 MCG/0.3ML SOSY injection Inject 0.3 mLs (150 mcg total) into the skin every 7 (seven) days. 09/18/16   Eber Jones, MD  furosemide (LASIX) 40 MG tablet Take 3 tablets (120 mg total) by mouth 2  (two) times daily. 09/18/16   Eber Jones, MD  hydrALAZINE (APRESOLINE) 25 MG tablet Take 3 tablets (75 mg total) by mouth every 8 (eight) hours. 09/18/16   Eber Jones, MD  HYDROcodone-acetaminophen (NORCO/VICODIN) 5-325 MG tablet Take 1-2 tablets by mouth every 6 (six) hours as needed for moderate pain or severe pain. 09/18/16   Eber Jones, MD  insulin lispro (HUMALOG) 100 UNIT/ML injection Inject 5 Units into the skin at bedtime.    Historical Provider, MD  insulin lispro (HUMALOG) 100 UNIT/ML injection Inject 5-10 Units into the skin 3 (three) times daily with meals. SSI as follows:  150-200 = 5 units, 201-300 = 8 units, 301-400 = 10 units, CBG >400 call provider    Historical Provider, MD  NUTRITIONAL SUPPLEMENT LIQD Take 120 mLs by mouth 2 (two) times daily.    Historical Provider, MD  polyethylene glycol (MIRALAX / GLYCOLAX) packet Take 17 g by mouth daily.    Historical Provider, MD  sennosides-docusate sodium (SENOKOT-S) 8.6-50 MG tablet Take 2 tablets by mouth at bedtime.    Historical Provider, MD    Family History Family History  Problem Relation Age of Onset  . Brain cancer Mother   . Diabetes type II Sister   . Narcolepsy Paternal Uncle     Social History Social History  Substance Use Topics  . Smoking status: Former Smoker    Quit date: 08/26/1998  . Smokeless tobacco: Never Used  . Alcohol use No     Comment: in the past     Allergies   Metformin and related   Review of Systems Review of Systems  All other systems reviewed and are negative.    Physical Exam Updated Vital Signs BP (!) 152/61   Pulse (!) 59   Temp 97.5 F (36.4 C)   Resp 15   SpO2 100%   Physical Exam  Constitutional: He is oriented to person, place, and time. He appears well-developed and well-nourished.  HENT:  Head: Normocephalic.  Eyes: EOM are normal.  Neck: Normal range of motion.  Pulmonary/Chest: Effort normal and breath sounds normal. No respiratory  distress. He has no rales.  Abdominal: He exhibits no distension.  Musculoskeletal: Normal range of motion.  Bilateral chronic lower extremity edema with 3+ pitting edema.  Scaling of the skin.  Some warmth without significant erythema.  No drainage.  Neurological: He is alert and oriented to person, place, and time.  Psychiatric: He has a normal mood and affect.  Nursing note and vitals reviewed.    ED Treatments / Results  Labs (all labs ordered are listed, but only abnormal results are displayed) Labs Reviewed - No data to display  EKG  EKG Interpretation None       Radiology No results found.  Procedures Procedures (including critical care time)   +++++++++++++++++++++++++++++++++++++++++++++  UNA BOOT APPLICATION (BILATERAL) Authorized by: Hoy Morn Consent: Verbal consent obtained. Risks and benefits: risks, benefits and alternatives were discussed Consent given by: patient Splint applied by: myself BILATERAL APPLICATION Splint type: Ardelia Mems boot  Supplies used: soft wrap and ace bandages Post-procedure: The splinted body part was neurovascularly unchanged following the procedure. Patient tolerance: Patient tolerated the procedure well with no immediate complications.  ++++++++++++++++++++++++++++++++++++++++++++   Medications Ordered in ED Medications - No data to display   Initial Impression / Assessment and Plan / ED Course  I have reviewed the triage vital signs and the nursing notes.  Pertinent labs & imaging results that were available during my care of the patient were reviewed by me and considered in my medical decision making (see chart for details).     Chronic lower [place] swelling.  Patient be referred back to the wound care center.  Bilateral Unna boots placed by myself for compression.  Patient states it makes his legs feel much better.  Final Clinical Impressions(s) / ED Diagnoses   Final diagnoses:  None    New Prescriptions New  Prescriptions   No medications on file     Jola Schmidt, MD 11/08/16 1342

## 2016-11-08 NOTE — ED Notes (Signed)
Report called to [facility]. Spoke to Mudlogger at facility. She was surprised that patient was returning to facility as they are assisted living and not skilled nursing. No RN available in house to give report to, only med techs. Report given to med tech responsible for care for [name].

## 2016-11-08 NOTE — ED Notes (Signed)
Bed: WA09 Expected date: 11/08/16 Expected time: 12:05 PM Means of arrival: Ambulance Comments: Leg swelling

## 2016-11-08 NOTE — ED Notes (Signed)
ptar called 

## 2016-11-11 ENCOUNTER — Institutional Professional Consult (permissible substitution): Payer: Self-pay | Admitting: Pulmonary Disease

## 2016-11-19 ENCOUNTER — Telehealth: Payer: Self-pay | Admitting: *Deleted

## 2016-11-19 NOTE — Telephone Encounter (Signed)
Faxed referral notes from [facility], PA-C sent to scheduling

## 2016-11-25 ENCOUNTER — Encounter: Payer: Self-pay | Admitting: Internal Medicine

## 2016-11-25 ENCOUNTER — Non-Acute Institutional Stay (SKILLED_NURSING_FACILITY): Payer: Medicare HMO | Admitting: Internal Medicine

## 2016-11-25 DIAGNOSIS — N183 Chronic kidney disease, stage 3 unspecified: Secondary | ICD-10-CM

## 2016-11-25 DIAGNOSIS — R196 Halitosis: Secondary | ICD-10-CM | POA: Diagnosis not present

## 2016-11-25 DIAGNOSIS — R5381 Other malaise: Secondary | ICD-10-CM

## 2016-11-25 DIAGNOSIS — E1122 Type 2 diabetes mellitus with diabetic chronic kidney disease: Secondary | ICD-10-CM

## 2016-11-25 DIAGNOSIS — D638 Anemia in other chronic diseases classified elsewhere: Secondary | ICD-10-CM | POA: Diagnosis not present

## 2016-11-25 DIAGNOSIS — F329 Major depressive disorder, single episode, unspecified: Secondary | ICD-10-CM | POA: Diagnosis not present

## 2016-11-25 DIAGNOSIS — M79605 Pain in left leg: Secondary | ICD-10-CM

## 2016-11-25 DIAGNOSIS — K5909 Other constipation: Secondary | ICD-10-CM | POA: Diagnosis not present

## 2016-11-25 DIAGNOSIS — I480 Paroxysmal atrial fibrillation: Secondary | ICD-10-CM | POA: Diagnosis not present

## 2016-11-25 DIAGNOSIS — I5032 Chronic diastolic (congestive) heart failure: Secondary | ICD-10-CM | POA: Diagnosis not present

## 2016-11-25 DIAGNOSIS — E785 Hyperlipidemia, unspecified: Secondary | ICD-10-CM | POA: Diagnosis not present

## 2016-11-25 DIAGNOSIS — I890 Lymphedema, not elsewhere classified: Secondary | ICD-10-CM

## 2016-11-25 DIAGNOSIS — J309 Allergic rhinitis, unspecified: Secondary | ICD-10-CM

## 2016-11-25 DIAGNOSIS — M79604 Pain in right leg: Secondary | ICD-10-CM | POA: Diagnosis not present

## 2016-11-25 DIAGNOSIS — F32A Depression, unspecified: Secondary | ICD-10-CM

## 2016-11-25 DIAGNOSIS — G8929 Other chronic pain: Secondary | ICD-10-CM

## 2016-11-25 NOTE — Progress Notes (Signed)
LOCATION: [place]  PCP: [name], PA   Code Status: Full Code  Goals of care: Advanced Directive information Advanced Directives 11/08/2016  Does Patient Have a Medical Advance Directive? No  Would patient like information on creating a medical advance directive? No - Patient declined  Pre-existing out of facility DNR order (yellow form or pink MOST form) -       Extended Emergency Contact Information Primary Emergency Contact: Morken,Tami Address: [address]          [place], [place] [address] Johnnette Litter of [place] Phone: [phone] Mobile Phone: [phone] Relation: Daughter Secondary Emergency Contact: Dedra Skeens [place] of [place] Phone: [phone] Relation: Friend   Allergies  Allergen Reactions  . Metformin And Related     Reaction: pt states he thinks he threw up    Chief Complaint  Patient presents with  . New Admit To SNF    New Admission Visit      HPI:  Patient is a 83 y.o. male seen today for long term care from [facility]. He has medical history of afib, CKD, DM, CHF, HTN among others. He is seen in his room today. He participates in conversation but has a flat affect. He mentions that he feels that he has been mistreated since childhood. He does not Copy. He does not like this facility and did not like the place he was at prior to this. On chart review, he has occasional bowel and bladder incontinence and can be confused intermittently.   Review of Systems:  Constitutional: Negative for fever, chills, diaphoresis.  HENT: Negative for headache, congestion, nasal discharge, sore throat, difficulty swallowing.   Eyes: Negative for blurred vision, double vision and discharge.  Respiratory: Negative for cough. Has dyspnea with exertion.  Cardiovascular: Negative for chest pain, palpitations. Positive for chronic leg swelling.  Gastrointestinal: Negative for heartburn, nausea, vomiting,  abdominal pain. He had a bowel movement this am Genitourinary: Negative for dysuria Musculoskeletal: Negative for fall.  positive for chronic leg pain.  Skin: Negative for itching, rash.  Neurological: Negative for dizziness.    Past Medical History:  Diagnosis Date  . Atrial fibrillation ([place])   . CHF (congestive heart failure) ([place])   . Chronic kidney disease   . Coronary artery disease   . Diabetes mellitus without complication ([place])   . Hypertension    Past Surgical History:  Procedure Laterality Date  . COLONOSCOPY  06/21/2012   Procedure: COLONOSCOPY;  Surgeon: Juanita Craver, MD;  Location: [facility] ENDOSCOPY;  Service: Endoscopy;  Laterality: N/A;  . COLONOSCOPY Left 02/13/2014   Procedure: COLONOSCOPY;  Surgeon: Juanita Craver, MD;  Location: [facility] ENDOSCOPY;  Service: Endoscopy;  Laterality: Left;  . ESOPHAGOGASTRODUODENOSCOPY  06/19/2012   Procedure: ESOPHAGOGASTRODUODENOSCOPY (EGD);  Surgeon: Beryle Beams, MD;  Location[facility] ENDOSCOPY;  Service: Endoscopy;  Laterality: N/A;  . LAPAROSCOPIC RIGHT COLECTOMY Right 02/15/2014   Procedure: LAPAROSCOPIC ASISSTED RIGHT  COLECTOMY, POSSIBLE OPEN;  Surgeon: Rolm Bookbinder, MD;  Location: [facility];  Service: General;  Laterality: Right;  . LEG SURGERY     Left leg surgery. broken leg   Social History:   reports that he quit smoking about 18 years ago. He has never used smokeless tobacco. He reports that he does not drink alcohol or use drugs.  Family History  Problem Relation Age of Onset  . Brain cancer Mother   . Diabetes type II Sister   . Narcolepsy Paternal Uncle  Medications: Allergies as of 11/25/2016      Reactions   Metformin And Related    Reaction: pt states he thinks he threw up      Medication List       Accurate as of 11/25/16  2:19 PM. Always use your most recent med list.          apixaban 2.5 MG Tabs tablet Commonly known as:  ELIQUIS Take 1 tablet (2.5 mg total) by mouth 2 (two) times daily.     atorvastatin 20 MG tablet Commonly known as:  LIPITOR Take 1 tablet (20 mg total) by mouth daily at 6 PM.   fluticasone 50 MCG/ACT nasal spray Commonly known as:  FLONASE Place 2 sprays into both nostrils daily.   furosemide 40 MG tablet Commonly known as:  LASIX Take 3 tablets (120 mg total) by mouth 2 (two) times daily.   hydrALAZINE 25 MG tablet Commonly known as:  APRESOLINE Take 3 tablets (75 mg total) by mouth every 8 (eight) hours.   HYDROcodone-acetaminophen 5-325 MG tablet Commonly known as:  NORCO/VICODIN Take 1 tablet by mouth every 6 (six) hours as needed for moderate pain.   insulin aspart 100 UNIT/ML injection Commonly known as:  novoLOG Inject 5-10 Units into the skin 3 (three) times daily before meals.   polyethylene glycol packet Commonly known as:  MIRALAX / GLYCOLAX Take 17 g by mouth daily.   sennosides-docusate sodium 8.6-50 MG tablet Commonly known as:  SENOKOT-S Take 2 tablets by mouth at bedtime.       Immunizations: Immunization History  Administered Date(s) Administered  . Influenza Split 06/25/2011  . Influenza, High Dose Seasonal PF 06/07/2013  . Influenza,inj,Quad PF,36+ Mos 09/06/2016  . Pneumococcal Polysaccharide-23 02/25/2012, 06/20/2012  . Td 01/28/2005  . Tdap 10/20/2014, 04/14/2016     Physical Exam Vitals:   11/25/16 1415  BP: 132/77  Pulse: 72  Resp: 18  Temp: 97.3 F (36.3 C)  TempSrc: Oral  SpO2: 92%  Weight: 206 lb 3.2 oz (93.5 kg)  Height: 5\' 11"  (1.803 m)   Body mass index is 28.76 kg/m.  General- elderly male, overweight, in no acute distress Head- normocephalic, atraumatic Nose- no nasal discharge Throat- moist mucus membrane, edentulous, halitosis + Eyes- PERRLA, EOMI, no pallor, no icterus, no discharge, normal conjunctiva, normal sclera Neck- no cervical lymphadenopathy Cardiovascular- normal s1,s2, no murmur Respiratory- bilateral clear to auscultation, no wheeze, no rhonchi, no crackles, no use  of accessory muscles Abdomen- bowel sounds present, soft, non tender, no guarding or rigidity Musculoskeletal- able to move all 4 extremities, generalized weakness, has 2 layer compression wrap to both legs Neurological- alert and oriented to person, place and time Skin- warm and dry Psychiatry- poor eye contact, flat affect    Labs reviewed: Basic Metabolic Panel:  Recent Labs  09/16/16 0548 09/17/16 0533 09/18/16 0534 09/20/16  NA 138 138 138 139  K 4.5 4.6 4.9 4.6  CL 103 101 101  --   CO2 25 27 26   --   GLUCOSE 143* 140* 138*  --   BUN 56* 52* 47* 45*  CREATININE 3.18* 3.30* 3.20* 3.6*  CALCIUM 8.1* 8.2* 8.2*  --   MG 1.5* 1.4* 1.4*  --   PHOS 4.7* 4.1 3.9  --    Liver Function Tests:  Recent Labs  09/16/16 0548 09/17/16 0533 09/18/16 0534  AST 10* 12* 14*  ALT 9* 10* 11*  ALKPHOS 51 55 56  BILITOT 0.6 0.6 1.0  PROT 6.4*  6.5 6.4*  ALBUMIN 2.7* 3.1* 3.0*    Recent Labs  12/04/15 1431  LIPASE 30   No results for input(s): AMMONIA in the last 8760 hours. CBC:  Recent Labs  09/16/16 0548 09/17/16 0533 09/18/16 0534 09/20/16  WBC 8.5 7.8 9.5 8.7  NEUTROABS 4.3 4.1 5.1  --   HGB 7.9* 8.2* 8.4* 8.2*  HCT 26.0* 26.8* 27.7* 28*  MCV 77.4* 77.9* 77.6*  --   PLT 203 215 260 250   Cardiac Enzymes:  Recent Labs  09/06/16 1052 09/09/16 1728 09/10/16 0820  TROPONINI 0.03* <0.03 <0.03   BNP: Invalid input(s): POCBNP CBG:  Recent Labs  09/17/16 2157 09/18/16 0735 09/18/16 1200  GLUCAP 146* 153* 154*     Assessment/Plan  Physical deconditioning With generalized weakness. Will have patient work with PT/OT as tolerated to regain strength and restore function.  Fall precautions are in place. Will need to work on balance and endurance.   Chronic diastolic CHF Reviewed echo from 09/11/16 with normal EF. Continue furosemide 120 mg bid, hydralazine 75 mg tid and monitor weight. Check bmp  afib Controlled heart rate, continue eliquis 2.5 mg bid  for stroke prophylaxis.   Depression Patient appears depressed and paranoid. Get psychiatry consult. Supportive care for now. Will get social worker consult to evaluate on his long term residency choices. Patient is alert and oriented and refuses initiation of med for mood/ behavior at present. Check tsh.   Halitosis To provide oral hygiene post each meal and monitor for signs of infection. Provide chlorhexidine mouth wash tid x 1 week  Anemia of chronic disease With medical co-morbidities. Monitor cbc periodically.  Chronic leg pain With lymphedema. Continue norco 5-325 mg q6h prn pain and get PMR consult. Provide skin care with compression wrap to help with leg swelling.  Chronic lymphedema Continue lasix 120 mg bid, check bmp. Provide compression wrap to help with edema and monitor for signs of infection  Allergic rhinitis Stable symptom, continue flonase  Chronic constipation Had a bowel movement this am. Continue miralax qd with senna 2 tab qhs.  Hyperlipidemia Lipid Panel     Component Value Date/Time   CHOL 138 12/09/2015 0717   TRIG 86 12/09/2015 0717   HDL 25 (L) 12/09/2015 0717   CHOLHDL 5.5 12/09/2015 0717   VLDL 17 12/09/2015 0717   LDLCALC 96 12/09/2015 0717   On atorvastatin 20 mg qhs, check lipid panel  Type 2 DM with renal complication Lab Results  Component Value Date   HGBA1C 6.4 (H) 09/06/2016   Check a1c. Continue to monitor cbg. Continue novolog SSI. Continue statin   Goals of care: short term rehabilitation   Labs/tests ordered: cbc, cmp, a1c, tsh, lipid 11/27/16  Family/ staff Communication: reviewed care plan with patient and nursing supervisor  I have spent greater than 50 minutes for this encounter which includes reviewing hospital records, addressing above mentioned concerns, reviewing care plan with patient, answering patient's concerns and counseling him.    Blanchie Serve, MD Internal Medicine [facility] [address] [place], [place] [address] Cell Phone (Monday-Friday 8 am - 5 pm): [phone] On Call: [phone] and follow prompts after 5 pm and on weekends Office Phone: [phone] Office Fax: [phone]

## 2016-11-26 ENCOUNTER — Telehealth: Payer: Self-pay | Admitting: Cardiology

## 2016-11-26 NOTE — Telephone Encounter (Signed)
Received records from [facility] for appointment on 12/06/16 with Dr Ellyn Hack.  Records put with Dr Allison Quarry schedule for 12/06/16. lp

## 2016-11-27 ENCOUNTER — Inpatient Hospital Stay (HOSPITAL_COMMUNITY)
Admission: EM | Admit: 2016-11-27 | Discharge: 2016-11-30 | DRG: 378 | Disposition: A | Discharge status: Skilled Nursing Facility | Payer: Medicare HMO | Attending: Internal Medicine | Admitting: Internal Medicine

## 2016-11-27 ENCOUNTER — Encounter (HOSPITAL_COMMUNITY): Payer: Self-pay | Admitting: Emergency Medicine

## 2016-11-27 DIAGNOSIS — I130 Hypertensive heart and chronic kidney disease with heart failure and stage 1 through stage 4 chronic kidney disease, or unspecified chronic kidney disease: Secondary | ICD-10-CM | POA: Diagnosis present

## 2016-11-27 DIAGNOSIS — E1121 Type 2 diabetes mellitus with diabetic nephropathy: Secondary | ICD-10-CM | POA: Diagnosis present

## 2016-11-27 DIAGNOSIS — D631 Anemia in chronic kidney disease: Secondary | ICD-10-CM | POA: Diagnosis not present

## 2016-11-27 DIAGNOSIS — N179 Acute kidney failure, unspecified: Secondary | ICD-10-CM | POA: Diagnosis present

## 2016-11-27 DIAGNOSIS — Z87891 Personal history of nicotine dependence: Secondary | ICD-10-CM

## 2016-11-27 DIAGNOSIS — E1122 Type 2 diabetes mellitus with diabetic chronic kidney disease: Secondary | ICD-10-CM | POA: Diagnosis present

## 2016-11-27 DIAGNOSIS — I4892 Unspecified atrial flutter: Secondary | ICD-10-CM | POA: Diagnosis present

## 2016-11-27 DIAGNOSIS — Z6827 Body mass index (BMI) 27.0-27.9, adult: Secondary | ICD-10-CM | POA: Diagnosis not present

## 2016-11-27 DIAGNOSIS — I890 Lymphedema, not elsewhere classified: Secondary | ICD-10-CM | POA: Diagnosis present

## 2016-11-27 DIAGNOSIS — Z9049 Acquired absence of other specified parts of digestive tract: Secondary | ICD-10-CM | POA: Diagnosis not present

## 2016-11-27 DIAGNOSIS — R32 Unspecified urinary incontinence: Secondary | ICD-10-CM | POA: Diagnosis not present

## 2016-11-27 DIAGNOSIS — E669 Obesity, unspecified: Secondary | ICD-10-CM | POA: Diagnosis present

## 2016-11-27 DIAGNOSIS — I2510 Atherosclerotic heart disease of native coronary artery without angina pectoris: Secondary | ICD-10-CM | POA: Diagnosis present

## 2016-11-27 DIAGNOSIS — Z79899 Other long term (current) drug therapy: Secondary | ICD-10-CM

## 2016-11-27 DIAGNOSIS — E1165 Type 2 diabetes mellitus with hyperglycemia: Secondary | ICD-10-CM | POA: Diagnosis present

## 2016-11-27 DIAGNOSIS — Z85038 Personal history of other malignant neoplasm of large intestine: Secondary | ICD-10-CM | POA: Diagnosis not present

## 2016-11-27 DIAGNOSIS — I480 Paroxysmal atrial fibrillation: Secondary | ICD-10-CM | POA: Diagnosis present

## 2016-11-27 DIAGNOSIS — N184 Chronic kidney disease, stage 4 (severe): Secondary | ICD-10-CM | POA: Diagnosis present

## 2016-11-27 DIAGNOSIS — D61818 Other pancytopenia: Secondary | ICD-10-CM | POA: Diagnosis present

## 2016-11-27 DIAGNOSIS — Z794 Long term (current) use of insulin: Secondary | ICD-10-CM | POA: Diagnosis not present

## 2016-11-27 DIAGNOSIS — IMO0002 Reserved for concepts with insufficient information to code with codable children: Secondary | ICD-10-CM

## 2016-11-27 DIAGNOSIS — I483 Typical atrial flutter: Secondary | ICD-10-CM | POA: Diagnosis not present

## 2016-11-27 DIAGNOSIS — D649 Anemia, unspecified: Secondary | ICD-10-CM | POA: Diagnosis present

## 2016-11-27 DIAGNOSIS — K922 Gastrointestinal hemorrhage, unspecified: Principal | ICD-10-CM | POA: Diagnosis present

## 2016-11-27 DIAGNOSIS — Z7901 Long term (current) use of anticoagulants: Secondary | ICD-10-CM | POA: Diagnosis not present

## 2016-11-27 DIAGNOSIS — R195 Other fecal abnormalities: Secondary | ICD-10-CM | POA: Diagnosis not present

## 2016-11-27 DIAGNOSIS — I5032 Chronic diastolic (congestive) heart failure: Secondary | ICD-10-CM | POA: Diagnosis present

## 2016-11-27 DIAGNOSIS — I10 Essential (primary) hypertension: Secondary | ICD-10-CM | POA: Diagnosis present

## 2016-11-27 DIAGNOSIS — Z7951 Long term (current) use of inhaled steroids: Secondary | ICD-10-CM | POA: Diagnosis not present

## 2016-11-27 HISTORY — DX: Gastrointestinal hemorrhage, unspecified: K92.2

## 2016-11-27 HISTORY — DX: Personal history of other medical treatment: Z92.89

## 2016-11-27 HISTORY — DX: Type 2 diabetes mellitus with diabetic neuropathy, unspecified: E11.40

## 2016-11-27 HISTORY — DX: Chronic kidney disease, stage 4 (severe): N18.4

## 2016-11-27 HISTORY — DX: Depression, unspecified: F32.A

## 2016-11-27 HISTORY — DX: Long term (current) use of insulin: Z79.4

## 2016-11-27 HISTORY — DX: Malignant neoplasm of colon, unspecified: C18.9

## 2016-11-27 HISTORY — DX: Type 2 diabetes mellitus without complications: E11.9

## 2016-11-27 HISTORY — DX: Unspecified atrial flutter: I48.92

## 2016-11-27 HISTORY — DX: Chronic diastolic (congestive) heart failure: I50.32

## 2016-11-27 HISTORY — DX: Major depressive disorder, single episode, unspecified: F32.9

## 2016-11-27 HISTORY — DX: Reserved for inherently not codable concepts without codable children: IMO0001

## 2016-11-27 LAB — COMPREHENSIVE METABOLIC PANEL
ALT: 8 U/L — ABNORMAL LOW (ref 17–63)
AST: 8 U/L — AB (ref 15–41)
Albumin: 3.6 g/dL (ref 3.5–5.0)
Alkaline Phosphatase: 50 U/L (ref 38–126)
Anion gap: 10 (ref 5–15)
BILIRUBIN TOTAL: 0.5 mg/dL (ref 0.3–1.2)
BUN: 55 mg/dL — AB (ref 6–20)
CHLORIDE: 108 mmol/L (ref 101–111)
CO2: 22 mmol/L (ref 22–32)
Calcium: 8.6 mg/dL — ABNORMAL LOW (ref 8.9–10.3)
Creatinine, Ser: 3.12 mg/dL — ABNORMAL HIGH (ref 0.61–1.24)
GFR calc Af Amer: 20 mL/min — ABNORMAL LOW (ref >60)
GFR calc non Af Amer: 17 mL/min — ABNORMAL LOW (ref >60)
GLUCOSE: 145 mg/dL — AB (ref 65–99)
POTASSIUM: 3.9 mmol/L (ref 3.5–5.1)
SODIUM: 140 mmol/L (ref 135–145)
TOTAL PROTEIN: 6.7 g/dL (ref 6.5–8.1)

## 2016-11-27 LAB — CBC WITH DIFFERENTIAL/PLATELET
BASOS ABS: 0 10*3/uL (ref 0.0–0.1)
Basophils Relative: 0 %
Eosinophils Absolute: 0 10*3/uL (ref 0.0–0.7)
Eosinophils Relative: 1 %
HEMATOCRIT: 19.6 % — AB (ref 39.0–52.0)
Hemoglobin: 5.9 g/dL — CL (ref 13.0–17.0)
LYMPHS ABS: 0.9 10*3/uL (ref 0.7–4.0)
LYMPHS PCT: 24 %
MCH: 22 pg — ABNORMAL LOW (ref 26.0–34.0)
MCHC: 30.1 g/dL (ref 30.0–36.0)
MCV: 73.1 fL — ABNORMAL LOW (ref 78.0–100.0)
MONOS PCT: 50 %
Monocytes Absolute: 2 10*3/uL — ABNORMAL HIGH (ref 0.1–1.0)
Neutro Abs: 1 10*3/uL — ABNORMAL LOW (ref 1.7–7.7)
Neutrophils Relative %: 25 %
Platelets: 101 10*3/uL — ABNORMAL LOW (ref 150–400)
RBC: 2.68 MIL/uL — AB (ref 4.22–5.81)
RDW: 19 % — AB (ref 11.5–15.5)
WBC: 3.9 10*3/uL — ABNORMAL LOW (ref 4.0–10.5)

## 2016-11-27 LAB — PROTIME-INR
INR: 1.34
Prothrombin Time: 16.7 seconds — ABNORMAL HIGH (ref 11.4–15.2)

## 2016-11-27 LAB — POC OCCULT BLOOD, ED: FECAL OCCULT BLD: POSITIVE — AB

## 2016-11-27 LAB — PREPARE RBC (CROSSMATCH)

## 2016-11-27 LAB — TROPONIN I: Troponin I: <0.03 ng/mL (ref <0.03)

## 2016-11-27 LAB — GLUCOSE, CAPILLARY: Glucose-Capillary: 127 mg/dL — ABNORMAL HIGH (ref 65–99)

## 2016-11-27 LAB — BRAIN NATRIURETIC PEPTIDE: B NATRIURETIC PEPTIDE 5: 361.4 pg/mL — AB (ref 0.0–100.0)

## 2016-11-27 MED ORDER — SODIUM CHLORIDE 0.9 % IV SOLN
Freq: Once | INTRAVENOUS | Status: AC
  Administered 2016-11-27: 19:00:00 via INTRAVENOUS

## 2016-11-27 MED ORDER — HYDROCODONE-ACETAMINOPHEN 5-325 MG PO TABS: 1.0000 | ORAL_TABLET | Freq: Four times a day (QID) | ORAL | Status: DC | PRN

## 2016-11-27 MED ORDER — ACETAMINOPHEN 325 MG PO TABS: 650.0000 mg | ORAL_TABLET | Freq: Four times a day (QID) | ORAL | Status: DC | PRN

## 2016-11-27 MED ORDER — SODIUM CHLORIDE 0.9% FLUSH: 3.0000 mL | INTRAVENOUS | Status: DC | PRN

## 2016-11-27 MED ORDER — ONDANSETRON HCL 4 MG PO TABS: 4.0000 mg | ORAL_TABLET | Freq: Four times a day (QID) | ORAL | Status: DC | PRN

## 2016-11-27 MED ORDER — INSULIN ASPART 100 UNIT/ML ~~LOC~~ SOLN
0.0000 [IU] | SUBCUTANEOUS | Status: DC
  Administered 2016-11-28 – 2016-11-29 (×3): 1 [IU] via SUBCUTANEOUS
  Administered 2016-11-29: 2 [IU] via SUBCUTANEOUS

## 2016-11-27 MED ORDER — SODIUM CHLORIDE 0.9% FLUSH
3.0000 mL | Freq: Two times a day (BID) | INTRAVENOUS | Status: DC
  Administered 2016-11-28 (×3): 3 mL via INTRAVENOUS

## 2016-11-27 MED ORDER — FUROSEMIDE 10 MG/ML IJ SOLN
60.0000 mg | Freq: Once | INTRAMUSCULAR | Status: AC
  Administered 2016-11-28: 60 mg via INTRAVENOUS
  Filled 2016-11-27: qty 6

## 2016-11-27 MED ORDER — PANTOPRAZOLE SODIUM 40 MG IV SOLR
40.0000 mg | Freq: Two times a day (BID) | INTRAVENOUS | Status: DC
  Administered 2016-11-28 (×2): 40 mg via INTRAVENOUS
  Filled 2016-11-27 (×2): qty 40

## 2016-11-27 MED ORDER — ONDANSETRON HCL 4 MG/2ML IJ SOLN: 4.0000 mg | Freq: Four times a day (QID) | INTRAMUSCULAR | Status: DC | PRN

## 2016-11-27 MED ORDER — ACETAMINOPHEN 650 MG RE SUPP: 650.0000 mg | Freq: Four times a day (QID) | RECTAL | Status: DC | PRN

## 2016-11-27 MED ORDER — SODIUM CHLORIDE 0.9% FLUSH
3.0000 mL | Freq: Two times a day (BID) | INTRAVENOUS | Status: DC
Start: 2016-11-27 — End: 2016-11-30
  Administered 2016-11-28: 3 mL via INTRAVENOUS

## 2016-11-27 MED ORDER — FLUTICASONE PROPIONATE 50 MCG/ACT NA SUSP
2.0000 | Freq: Every day | NASAL | Status: DC
  Administered 2016-11-28: 2 via NASAL
  Filled 2016-11-27: qty 16

## 2016-11-27 MED ORDER — SODIUM CHLORIDE 0.9 % IV SOLN: 250.0000 mL | INTRAVENOUS | Status: DC | PRN

## 2016-11-27 MED ORDER — FUROSEMIDE 10 MG/ML IJ SOLN
60.0000 mg | Freq: Two times a day (BID) | INTRAMUSCULAR | Status: DC
  Administered 2016-11-28 (×2): 60 mg via INTRAVENOUS
  Filled 2016-11-27 (×3): qty 6

## 2016-11-27 MED ORDER — ATORVASTATIN CALCIUM 20 MG PO TABS
20.0000 mg | ORAL_TABLET | Freq: Every day | ORAL | Status: DC
  Administered 2016-11-28: 20 mg via ORAL
  Filled 2016-11-27: qty 1

## 2016-11-27 MED ORDER — HYDRALAZINE HCL 25 MG PO TABS
75.0000 mg | ORAL_TABLET | Freq: Three times a day (TID) | ORAL | Status: DC
  Administered 2016-11-28 – 2016-11-29 (×3): 75 mg via ORAL
  Filled 2016-11-27 (×6): qty 1

## 2016-11-27 NOTE — ED Triage Notes (Signed)
Per EMS pt had routine labs drawn today with a resulting hgb of 5.6.  Per EMS pt at baseline orientation . Mild confusion, pain in back of legs, edema x4 extremities.  Pt in A - Flutter per EMS PTA.  EMS started a piv in left AC, 20 gauge.

## 2016-11-27 NOTE — ED Provider Notes (Signed)
[facility] DEPT Provider Note   CSN: [id number] Arrival date & time: 11/27/16  1642     History   Chief Complaint Chief Complaint  Patient presents with  . Abnormal Lab    HPI [name] is a 83 y.o. male.  HPI 83 yo M with h/o AFib, CHF, CKD, CAD sent here with anemia on Eliquis. On arrival, pt agitated that he was sent to ED and is unwilling to provide much history. He states he was sent here for anemia but denies any complaints. Answers in yes/no questions. He denies any CP, SOB, lightheadedness, palpitations. He declines any recent black or bloody stools. Denies any known bleeding. He states he has been adherent with his medications. He also endorses bilateral leg pain and cramping from his lymphedema that is chronic. His leg wrappings are changed daily and are due to change today. He does not believe he is bleeding from any wounds on his legs.   Past Medical History:  Diagnosis Date  . Atrial fibrillation ([place])   . CHF (congestive heart failure) ([place])   . Chronic kidney disease   . Coronary artery disease   . Diabetes mellitus without complication ([place])   . Hypertension     Patient Active Problem List   Diagnosis Date Noted  . GI bleeding 11/27/2016  . GI bleed 11/27/2016  . CKD (chronic kidney disease), stage IV ([place]) 11/27/2016  . Pressure injury of skin 09/18/2016  . Leukocytosis   . Mobitz type 1 second degree AV block 09/11/2016  . Pericardial effusion- moderate on CT scan 09/11/2016  . AKI (acute kidney injury) ([place]) 09/05/2016  . HTN (hypertension) 09/05/2016  . Acute urinary retention 09/05/2016  . Severe obesity (BMI >= 40) ([place]) 09/05/2016  . Acute CVA (cerebrovascular accident) ([place]) 12/09/2015  . Bradycardia 12/08/2015  . Fall 12/08/2015  . Pancytopenia ([place]) 12/08/2015  . Atrial flutter ([place]) 08/29/2015  . Hypoxia 08/28/2015  . PAF (paroxysmal atrial fibrillation) ([place])   . Noncompliance with medications 08/03/2015  . Chronic diastolic  CHF (congestive heart failure) ([place]) 07/31/2015  . Cellulitis of left lower extremity 07/31/2015  . Anemia of chronic renal failure, stage 3 (moderate) 07/31/2015  . Uncontrolled diabetes mellitus with diabetic nephropathy, with long-term current use of insulin ([place]) 07/31/2015  . Accelerated hypertension 07/31/2015  . Thrombocytopenia ([place]) 07/31/2015  . Acute renal failure superimposed on stage 3 chronic kidney disease ([place]) 11/29/2014  . Lymphedema 06/25/2011    Past Surgical History:  Procedure Laterality Date  . COLONOSCOPY  06/21/2012   Procedure: COLONOSCOPY;  Surgeon: Juanita Craver, MD;  Location: [facility] ENDOSCOPY;  Service: Endoscopy;  Laterality: N/A;  . COLONOSCOPY Left 02/13/2014   Procedure: COLONOSCOPY;  Surgeon: Juanita Craver, MD;  Location: [facility] ENDOSCOPY;  Service: Endoscopy;  Laterality: Left;  . ESOPHAGOGASTRODUODENOSCOPY  06/19/2012   Procedure: ESOPHAGOGASTRODUODENOSCOPY (EGD);  Surgeon: Beryle Beams, MD;  Location: [facility] ENDOSCOPY;  Service: Endoscopy;  Laterality: N/A;  . LAPAROSCOPIC RIGHT COLECTOMY Right 02/15/2014   Procedure: LAPAROSCOPIC ASISSTED RIGHT  COLECTOMY, POSSIBLE OPEN;  Surgeon: Rolm Bookbinder, MD;  Location: [facility];  Service: General;  Laterality: Right;  . LEG SURGERY     Left leg surgery. broken leg       Home Medications    Prior to Admission medications   Medication Sig Start Date End Date Taking? Authorizing Provider  apixaban (ELIQUIS) 2.5 MG TABS tablet Take 1 tablet (2.5 mg total) by mouth 2 (two) times daily. 12/12/15   Ripudeep Krystal Eaton, MD  atorvastatin (  LIPITOR) 20 MG tablet Take 1 tablet (20 mg total) by mouth daily at 6 PM. 12/12/15   Ripudeep K Rai, MD  fluticasone (FLONASE) 50 MCG/ACT nasal spray Place 2 sprays into both nostrils daily.    Historical Provider, MD  furosemide (LASIX) 40 MG tablet Take 3 tablets (120 mg total) by mouth 2 (two) times daily. 09/18/16   Eber Jones, MD  hydrALAZINE (APRESOLINE) 25 MG tablet Take 3 tablets  (75 mg total) by mouth every 8 (eight) hours. 09/18/16   Eber Jones, MD  HYDROcodone-acetaminophen (NORCO/VICODIN) 5-325 MG tablet Take 1 tablet by mouth every 6 (six) hours as needed for moderate pain.    Historical Provider, MD  insulin aspart (NOVOLOG) 100 UNIT/ML injection Inject 5-10 Units into the skin 3 (three) times daily before meals.    Historical Provider, MD  polyethylene glycol (MIRALAX / GLYCOLAX) packet Take 17 g by mouth daily.    Historical Provider, MD  sennosides-docusate sodium (SENOKOT-S) 8.6-50 MG tablet Take 2 tablets by mouth at bedtime.    Historical Provider, MD    Family History Family History  Problem Relation Age of Onset  . Brain cancer Mother   . Diabetes type II Sister   . Narcolepsy Paternal Uncle     Social History Social History  Substance Use Topics  . Smoking status: Former Smoker    Quit date: 08/26/1998  . Smokeless tobacco: Never Used  . Alcohol use No     Comment: in the past     Allergies   Metformin and related   Review of Systems Review of Systems  Constitutional: Positive for fatigue. Negative for chills and fever.  HENT: Negative for congestion and rhinorrhea.   Eyes: Negative for visual disturbance.  Respiratory: Negative for cough, shortness of breath and wheezing.   Cardiovascular: Positive for leg swelling. Negative for chest pain.  Gastrointestinal: Negative for abdominal pain, diarrhea, nausea and vomiting.  Genitourinary: Negative for dysuria and flank pain.  Musculoskeletal: Negative for neck pain and neck stiffness.  Skin: Negative for rash and wound.  Allergic/Immunologic: Negative for immunocompromised state.  Neurological: Negative for syncope, weakness and headaches.  All other systems reviewed and are negative.    Physical Exam Updated Vital Signs BP (!) 153/45 (BP Location: Right Arm)   Pulse 61   Temp 98.8 F (37.1 C) (Oral)   Resp 15   Ht 5' 10.5" (1.791 m)   Wt 215 lb 11.2 oz (97.8 kg)    SpO2 100%   BMI 30.51 kg/m   Physical Exam  Constitutional: He is oriented to person, place, and time. He appears well-developed and well-nourished. No distress.  HENT:  Head: Normocephalic and atraumatic.  Facial and conjunctival pallor  Eyes: Conjunctivae are normal.  Neck: Neck supple.  Cardiovascular: Normal rate, regular rhythm and normal heart sounds.  Exam reveals no friction rub.   No murmur heard. Pulmonary/Chest: Effort normal and breath sounds normal. No respiratory distress. He has no wheezes. He has no rales.  Abdominal: He exhibits no distension.  Genitourinary:  Genitourinary Comments: Soft brown stool in rectal vault. No bright red blood. No active bleeding.  Musculoskeletal: He exhibits edema (Bilateral mild UE edema, LE with 3+ pitting edema bilaterally and chronic lymphedema skin changes; no bleeding woudns).  Neurological: He is alert and oriented to person, place, and time. He exhibits normal muscle tone.  Skin: Skin is warm. Capillary refill takes less than 2 seconds.  Psychiatric: He has a normal mood and affect.  Nursing note and vitals reviewed.    ED Treatments / Results  Labs (all labs ordered are listed, but only abnormal results are displayed) Labs Reviewed  CBC WITH DIFFERENTIAL/PLATELET - Abnormal; Notable for the following:       Result Value   WBC 3.9 (*)    RBC 2.68 (*)    Hemoglobin 5.9 (*)    HCT 19.6 (*)    MCV 73.1 (*)    MCH 22.0 (*)    RDW 19.0 (*)    Platelets 101 (*)    Neutro Abs 1.0 (*)    Monocytes Absolute 2.0 (*)    All other components within normal limits  COMPREHENSIVE METABOLIC PANEL - Abnormal; Notable for the following:    Glucose, Bld 145 (*)    BUN 55 (*)    Creatinine, Ser 3.12 (*)    Calcium 8.6 (*)    AST 8 (*)    ALT 8 (*)    GFR calc non Af Amer 17 (*)    GFR calc Af Amer 20 (*)    All other components within normal limits  PROTIME-INR - Abnormal; Notable for the following:    Prothrombin Time 16.7 (*)      All other components within normal limits  BRAIN NATRIURETIC PEPTIDE - Abnormal; Notable for the following:    B Natriuretic Peptide 361.4 (*)    All other components within normal limits  GLUCOSE, CAPILLARY - Abnormal; Notable for the following:    Glucose-Capillary 127 (*)    All other components within normal limits  POC OCCULT BLOOD, ED - Abnormal; Notable for the following:    Fecal Occult Bld POSITIVE (*)    All other components within normal limits  TROPONIN I  VITAMIN B12  FOLATE  IRON AND TIBC  FERRITIN  RETICULOCYTES  BASIC METABOLIC PANEL  TYPE AND SCREEN  PREPARE RBC (CROSSMATCH)    EKG  EKG Interpretation  Date/Time:  Wednesday November 27 2016 17:28:00 EDT Ventricular Rate:  56 PR Interval:    QRS Duration: 86 QT Interval:  474 QTC Calculation: 445 R Axis:   42 Text Interpretation:  Atrial flutter Ventricular premature complex Repol abnrm suggests ischemia, inferior leads Since last EKG, atrial flutter has replaced 2nd degree AV block Confirmed by Alexei Ey MD, Lysbeth Galas ([id number]) on 11/27/2016 5:31:18 PM       Radiology No results found.  Procedures Procedures (including critical care time)  Medications Ordered in ED Medications  pantoprazole (PROTONIX) injection 40 mg (not administered)  HYDROcodone-acetaminophen (NORCO/VICODIN) 5-325 MG per tablet 1 tablet (not administered)  fluticasone (FLONASE) 50 MCG/ACT nasal spray 2 spray (not administered)  hydrALAZINE (APRESOLINE) tablet 75 mg (not administered)  atorvastatin (LIPITOR) tablet 20 mg (not administered)  insulin aspart (novoLOG) injection 0-9 Units (not administered)  sodium chloride flush (NS) 0.9 % injection 3 mL (3 mLs Intravenous Not Given 11/27/16 2200)  sodium chloride flush (NS) 0.9 % injection 3 mL (not administered)  sodium chloride flush (NS) 0.9 % injection 3 mL (not administered)  0.9 %  sodium chloride infusion (not administered)  acetaminophen (TYLENOL) tablet 650 mg (not administered)     Or  acetaminophen (TYLENOL) suppository 650 mg (not administered)  ondansetron (ZOFRAN) tablet 4 mg (not administered)    Or  ondansetron (ZOFRAN) injection 4 mg (not administered)  furosemide (LASIX) injection 60 mg (not administered)  furosemide (LASIX) injection 60 mg (not administered)  0.9 %  sodium chloride infusion ( Intravenous New Bag/Given 11/27/16 1928)  CRITICAL CARE Performed by: Evonnie Pat   Total critical care time: 35 minutes  Critical care time was exclusive of separately billable procedures and treating other patients.  Critical care was necessary to treat or prevent imminent or life-threatening deterioration.  Critical care was time spent personally by me on the following activities: development of treatment plan with patient and/or surrogate as well as nursing, discussions with consultants, evaluation of patient's response to treatment, examination of patient, obtaining history from patient or surrogate, ordering and performing treatments and interventions, ordering and review of laboratory studies, ordering and review of radiographic studies, pulse oximetry and re-evaluation of patient's condition.    Initial Impression / Assessment and Plan / ED Course  I have reviewed the triage vital signs and the nursing notes.  Pertinent labs & imaging results that were available during my care of the patient were reviewed by me and considered in my medical decision making (see chart for details).    83 yo M with extensive PMhx including CHF, CAD, lymphedema here with asymptomatic but significant edema. On arrival, VSS and WNL for pt. Pt has no active bleeding on exam but is hemoccult positive. He is at his neuro baseline according to facility. No abdominal pain. Lab work remarkable for Hgb 5.9, microcytic, below baseline of 8. CMP near baseline. D/w Dr. Fuller Plan of GI - will see pt in AM. Will need to transfuse, hold Eliquis. Will require very cautious transfusion in  setting of severe CHF, AFib, hypervolemia on exam. Admit to Hospitalist.  Final Clinical Impressions(s) / ED Diagnoses   Final diagnoses:  Anemia in stage 4 chronic kidney disease [facility])  Lymphedema    New Prescriptions Current Discharge Medication List       Duffy Bruce, MD 11/27/16 2238

## 2016-11-27 NOTE — ED Notes (Signed)
Pt from [facility]. Pt had bilateral extremities wrapped with dressings, dressings removed by MD. Pt has significant edema to bilateral lower extremities and feet. Pt has darkening to bilateral lower legs. Pt complains of pain in both lower legs, pt states this has been a chronic issue. Pt is alert and oriented x4, with some mild confusion.

## 2016-11-27 NOTE — ED Notes (Signed)
Attempted report x1. 

## 2016-11-27 NOTE — ED Notes (Signed)
1 unit blood ready 

## 2016-11-27 NOTE — H&P (Signed)
History and Physical    Ronny Korff YIR:[id number] DOB: 1934-02-05 DOA: 11/27/2016  PCP: [name], PA   Patient coming from: SNF  Chief Complaint: Low Hgb   HPI: Othar Curto is a 83 y.o. male with medical history significant for atrial flutter on Eliquis, insulin-dependent diabetes mellitus, chronic kidney disease stage IV, hypertension, and chronic diastolic CHF who presents from his skilled nursing facility for evaluation of a low hemoglobin. Patient is a very poor historian, uncooperative with the interview, and majority of the history therefore obtained through discussion with ED personnel and review of the EMR. Patient had reportedly been generally weak at the nursing facility despite work with physical therapy. He had just been evaluated by physician at the nursing home and a psychiatric consultation had been requested for the depressed mood and anger. Basic blood work was obtained and hemoglobin of 5.6 was noted. EMS was called for transport to the hospital for further evaluation of this. The patient had denied melena or hematochezia at the nursing facility. There have been no fevers reported at the nursing facility and no vomiting or diarrhea had been noted.  ED Course: Upon arrival to the ED, patient is found to be afebrile, saturating well on room air, with blood pressure of 124/42, and heart rate 59. EKG features atrial flutter with PVC. Chemistry panel is notable for BUN of 55 and serum creatinine of 3.12, with both values appearing close to baseline. CBC reveals a new leukopenia to 3900, microcytic anemia with hemoglobin 5.9 and MCV 73.1, and a thrombocytopenia with platelets 101,000. Hemoglobin had previously been in the 7-8 range and platelets have been intermittently low in the past. INR is 1.34, troponin is undetectable, and BNP is elevated to 36.1. Gastroenterology was consulted by the ED physician and advised transfusing the patient, holding the anticoagulant, obtaining a  medical admission, and GI will see in the morning. 2 units of packed red blood cells were ordered for immediate transfusion. Patient remained hemodynamically stable and in no acute respiratory distress in the ED and will be admitted to the telemetry unit for ongoing evaluation and management of anemia with hemoglobin 5.9 and occult GI bleeding while on Eliquis for atrial flutter.  Review of Systems:  Unable to obtain complete ROS secondary to patient's clinical condition with unwillingness to cooperate with interview.  Past Medical History:  Diagnosis Date  . Atrial fibrillation ([place])   . CHF (congestive heart failure) ([place])   . Chronic kidney disease   . Coronary artery disease   . Diabetes mellitus without complication ([place])   . Hypertension     Past Surgical History:  Procedure Laterality Date  . COLONOSCOPY  06/21/2012   Procedure: COLONOSCOPY;  Surgeon: Juanita Craver, MD;  Location: [facility] ENDOSCOPY;  Service: Endoscopy;  Laterality: N/A;  . COLONOSCOPY Left 02/13/2014   Procedure: COLONOSCOPY;  Surgeon: Juanita Craver, MD;  Location: [facility] ENDOSCOPY;  Service: Endoscopy;  Laterality: Left;  . ESOPHAGOGASTRODUODENOSCOPY  06/19/2012   Procedure: ESOPHAGOGASTRODUODENOSCOPY (EGD);  Surgeon: Beryle Beams, MD;  Location: [facility] ENDOSCOPY;  Service: Endoscopy;  Laterality: N/A;  . LAPAROSCOPIC RIGHT COLECTOMY Right 02/15/2014   Procedure: LAPAROSCOPIC ASISSTED RIGHT  COLECTOMY, POSSIBLE OPEN;  Surgeon: Rolm Bookbinder, MD;  Location: [facility];  Service: General;  Laterality: Right;  . LEG SURGERY     Left leg surgery. broken leg     reports that he quit smoking about 18 years ago. He has never used smokeless tobacco. He reports that he does not drink alcohol or  use drugs.  Allergies  Allergen Reactions  . Metformin And Related     Reaction: pt states he thinks he threw up    Family History  Problem Relation Age of Onset  . Brain cancer Mother   . Diabetes type II Sister   . Narcolepsy Paternal  Uncle      Prior to Admission medications   Medication Sig Start Date End Date Taking? Authorizing Provider  apixaban (ELIQUIS) 2.5 MG TABS tablet Take 1 tablet (2.5 mg total) by mouth 2 (two) times daily. 12/12/15   Ripudeep Krystal Eaton, MD  atorvastatin (LIPITOR) 20 MG tablet Take 1 tablet (20 mg total) by mouth daily at 6 PM. 12/12/15   Ripudeep K Rai, MD  fluticasone (FLONASE) 50 MCG/ACT nasal spray Place 2 sprays into both nostrils daily.    Historical Provider, MD  furosemide (LASIX) 40 MG tablet Take 3 tablets (120 mg total) by mouth 2 (two) times daily. 09/18/16   Eber Jones, MD  hydrALAZINE (APRESOLINE) 25 MG tablet Take 3 tablets (75 mg total) by mouth every 8 (eight) hours. 09/18/16   Eber Jones, MD  HYDROcodone-acetaminophen (NORCO/VICODIN) 5-325 MG tablet Take 1 tablet by mouth every 6 (six) hours as needed for moderate pain.    Historical Provider, MD  insulin aspart (NOVOLOG) 100 UNIT/ML injection Inject 5-10 Units into the skin 3 (three) times daily before meals.    Historical Provider, MD  polyethylene glycol (MIRALAX / GLYCOLAX) packet Take 17 g by mouth daily.    Historical Provider, MD  sennosides-docusate sodium (SENOKOT-S) 8.6-50 MG tablet Take 2 tablets by mouth at bedtime.    Historical Provider, MD    Physical Exam: Vitals:   11/27/16 1900 11/27/16 1930 11/27/16 2019 11/27/16 2035  BP: (!) 152/87 (!) 133/37 (!) 159/62 (!) 146/61  Pulse: (!) 59 62 62 (!) 59  Resp: 14 17 17 14   Temp:   98.4 F (36.9 C) 98.6 F (37 C)  TempSrc:   Oral Oral  SpO2: 97% 95% 100% 100%  Weight:      Height:          Constitutional: No acute distress, calm. Pale.  Eyes: PERTLA, lids and conjunctivae normal ENMT: Mucous membranes are moist. Posterior pharynx clear of any exudate or lesions.   Neck: normal, supple, no masses, no thyromegaly Respiratory: clear to auscultation bilaterally, no wheezing, no crackles. Normal respiratory effort.  Cardiovascular: Rate ~60 and  irregular. 2+ edema BLE's. Mild edema bilateral forearms. JVP not well-visualized. Abdomen: No distension, no tenderness, no masses palpated. Bowel sounds normal.  Musculoskeletal: no clubbing / cyanosis. No joint deformity upper and lower extremities.   Skin: no significant rashes, lesions, ulcers. Warm, dry, well-perfused. Pale.  Neurologic: CN 2-12 grossly intact. Sensation intact, DTR normal. Strength 5/5 in all 4 limbs.  Psychiatric: Alert and oriented x 3. Poorly-cooperative. Blunted affect.     Labs on Admission: I have personally reviewed following labs and imaging studies  CBC:  Recent Labs Lab 11/27/16 1736  WBC 3.9*  NEUTROABS 1.0*  HGB 5.9*  HCT 19.6*  MCV 73.1*  PLT 573*   Basic Metabolic Panel:  Recent Labs Lab 11/27/16 1736  NA 140  K 3.9  CL 108  CO2 22  GLUCOSE 145*  BUN 55*  CREATININE 3.12*  CALCIUM 8.6*   GFR: Estimated Creatinine Clearance: 22 mL/min (A) (by C-G formula based on SCr of 3.12 mg/dL (H)). Liver Function Tests:  Recent Labs Lab 11/27/16 1736  AST 8*  ALT 8*  ALKPHOS 50  BILITOT 0.5  PROT 6.7  ALBUMIN 3.6   No results for input(s): LIPASE, AMYLASE in the last 168 hours. No results for input(s): AMMONIA in the last 168 hours. Coagulation Profile:  Recent Labs Lab 11/27/16 1736  INR 1.34   Cardiac Enzymes:  Recent Labs Lab 11/27/16 1736  TROPONINI <0.03   BNP (last 3 results) No results for input(s): PROBNP in the last 8760 hours. HbA1C: No results for input(s): HGBA1C in the last 72 hours. CBG: No results for input(s): GLUCAP in the last 168 hours. Lipid Profile: No results for input(s): CHOL, HDL, LDLCALC, TRIG, CHOLHDL, LDLDIRECT in the last 72 hours. Thyroid Function Tests: No results for input(s): TSH, T4TOTAL, FREET4, T3FREE, THYROIDAB in the last 72 hours. Anemia Panel: No results for input(s): VITAMINB12, FOLATE, FERRITIN, TIBC, IRON, RETICCTPCT in the last 72 hours. Urine analysis:    Component  Value Date/Time   COLORURINE YELLOW 11/05/2016 2136   APPEARANCEUR CLEAR 11/05/2016 2136   LABSPEC 1.010 11/05/2016 2136   PHURINE 5.0 11/05/2016 2136   GLUCOSEU NEGATIVE 11/05/2016 2136   HGBUR NEGATIVE 11/05/2016 2136   BILIRUBINUR NEGATIVE 11/05/2016 2136   KETONESUR NEGATIVE 11/05/2016 2136   PROTEINUR 30 (A) 11/05/2016 2136   UROBILINOGEN 1.0 05/18/2015 1407   NITRITE NEGATIVE 11/05/2016 2136   LEUKOCYTESUR NEGATIVE 11/05/2016 2136   Sepsis Labs: @LABRCNTIP (procalcitonin:4,lacticidven:4) )No results found for this or any previous visit (from the past 240 hour(s)).   Radiological Exams on Admission: No results found.  EKG: Independently reviewed. Atrial flutter, PVC  Assessment/Plan  1. Symptomatic anemia, occult GI bleed - Pt presents with Hgb 5.9, pallor, occult blood in stool  - He has been hemodynamically stable and denies abd pain, nausea, hematochezia, or melena  - GI is consulting and much appreciated  - 2 units of pRBCs ordered for immediate transfusion  - Eliquis is held; Protonix 40 mg IV q12h given  - Check post-transfusion CBC    2. Atrial flutter, chronic  - Pt is in atrial flutter on admission  - CHADS-VASc is 41 (age x2, CHF, HTN, DM)  - Eliquis held for GIB as above; rate is controlled   3. Pancytopenia  - Pt has a new leukopenia on admission with WBC 3,900; platelets 101k on admission, intermittently low in past  - No evidence for acute infection; not on any of the commonly implicated medications - Anemia panel is added on to ED labs - Transfusing RBCs as above  - Follow-up post-transfusion CBC   4. Chronic diastolic CHF  - Pt appears to have some excess volume on admission but is breathing comfortably and laying nearly flat in ED   - TTE (09/11/16) with EF 28-78%; diastolic function could not be determined  - Managed at home with Lasix 120 mg BID, hydralazine  - Lasix 60 mg IV to be given between the units of blood and q12h thereafter  - Continue  hydralazine as tolerated  - Follow strict I/O's and daily wts   5. CKD stage IV - SCr is 3.12 on admission, appears consistent with baseline  - Renally-dose medications, avoid dehydration and nephrotoxins where possible   6. Hypertension - DBP soft on admission - Resume hydralazine in am as tolerated   7. Insulin-dependent DM  - A1c was 6.4% in January 2018  - Managed at home with Novolog 5-10 units TID  - Check CBG with meals and qHS - Cover with a low-intensity Novolog correctional     DVT  prophylaxis: SCD's  Code Status: Full  Family Communication: Discussed with patient Disposition Plan: Admit to telemetry Consults called: Gastroenterology Admission status: Inpatient    Vianne Bulls, MD [facility] Pager [phone]  If 7PM-7AM, please contact night-coverage [email or web address] Password [facility]  11/27/2016, 9:31 PM

## 2016-11-27 NOTE — Progress Notes (Signed)
New Admission Note: Pt transferred to unit from the [facility]  Arrival Method: via sstretcher  Mental Orientation: alert and oriented x 3 Telemetry: Initiated on box 21 Assessment: Completed Skin: Intact IV: L ac  Pain: denies Tubes: None Safety Measures: Safety Fall Prevention Plan has been discussed  Admission: to be completed [place] Orientation: Patient has been orientated to the room, unit and staff.  Family: None at bedside  Orders to be reviewed and implemented. Will continue to monitor the patient. Call light has been placed within reach and bed alarm has been activated.   Mady Gemma, BSN, RN-BC Phone: [phone]

## 2016-11-28 DIAGNOSIS — D631 Anemia in chronic kidney disease: Secondary | ICD-10-CM

## 2016-11-28 DIAGNOSIS — R195 Other fecal abnormalities: Secondary | ICD-10-CM

## 2016-11-28 DIAGNOSIS — Z9289 Personal history of other medical treatment: Secondary | ICD-10-CM

## 2016-11-28 DIAGNOSIS — D649 Anemia, unspecified: Secondary | ICD-10-CM

## 2016-11-28 DIAGNOSIS — N184 Chronic kidney disease, stage 4 (severe): Secondary | ICD-10-CM

## 2016-11-28 HISTORY — DX: Personal history of other medical treatment: Z92.89

## 2016-11-28 LAB — CBC
HCT: 24 % — ABNORMAL LOW (ref 39.0–52.0)
Hemoglobin: 7.4 g/dL — ABNORMAL LOW (ref 13.0–17.0)
MCH: 23.1 pg — AB (ref 26.0–34.0)
MCHC: 30.8 g/dL (ref 30.0–36.0)
MCV: 74.8 fL — ABNORMAL LOW (ref 78.0–100.0)
PLATELETS: 91 10*3/uL — AB (ref 150–400)
RBC: 3.21 MIL/uL — AB (ref 4.22–5.81)
RDW: 17.9 % — AB (ref 11.5–15.5)
WBC: 3.6 10*3/uL — ABNORMAL LOW (ref 4.0–10.5)

## 2016-11-28 LAB — GLUCOSE, CAPILLARY
GLUCOSE-CAPILLARY: 127 mg/dL — AB (ref 65–99)
GLUCOSE-CAPILLARY: 133 mg/dL — AB (ref 65–99)
Glucose-Capillary: 103 mg/dL — ABNORMAL HIGH (ref 65–99)
Glucose-Capillary: 119 mg/dL — ABNORMAL HIGH (ref 65–99)
Glucose-Capillary: 123 mg/dL — ABNORMAL HIGH (ref 65–99)
Glucose-Capillary: 124 mg/dL — ABNORMAL HIGH (ref 65–99)
Glucose-Capillary: 135 mg/dL — ABNORMAL HIGH (ref 65–99)
Glucose-Capillary: 136 mg/dL — ABNORMAL HIGH (ref 65–99)

## 2016-11-28 LAB — BASIC METABOLIC PANEL
Anion gap: 10 (ref 5–15)
BUN: 52 mg/dL — AB (ref 6–20)
CALCIUM: 8.8 mg/dL — AB (ref 8.9–10.3)
CO2: 21 mmol/L — AB (ref 22–32)
Chloride: 109 mmol/L (ref 101–111)
Creatinine, Ser: 2.89 mg/dL — ABNORMAL HIGH (ref 0.61–1.24)
GFR calc Af Amer: 22 mL/min — ABNORMAL LOW (ref >60)
GFR, EST NON AFRICAN AMERICAN: 19 mL/min — AB (ref >60)
GLUCOSE: 101 mg/dL — AB (ref 65–99)
Potassium: 3.8 mmol/L (ref 3.5–5.1)
Sodium: 140 mmol/L (ref 135–145)

## 2016-11-28 LAB — MRSA PCR SCREENING: MRSA BY PCR: NEGATIVE

## 2016-11-28 LAB — IRON AND TIBC
Iron: 21 ug/dL — ABNORMAL LOW (ref 45–182)
Saturation Ratios: 6 % — ABNORMAL LOW (ref 17.9–39.5)
TIBC: 336 ug/dL (ref 250–450)
UIBC: 315 ug/dL

## 2016-11-28 LAB — PATHOLOGIST SMEAR REVIEW

## 2016-11-28 LAB — FERRITIN: Ferritin: 9 ng/mL — ABNORMAL LOW (ref 24–336)

## 2016-11-28 LAB — FOLATE: FOLATE: 13.9 ng/mL (ref >5.9)

## 2016-11-28 LAB — RETICULOCYTES
RBC.: 3.21 MIL/uL — AB (ref 4.22–5.81)
RETIC CT PCT: 0.9 % (ref 0.4–3.1)
Retic Count, Absolute: 28.9 10*3/uL (ref 19.0–186.0)

## 2016-11-28 LAB — VITAMIN B12: VITAMIN B 12: 582 pg/mL (ref 180–914)

## 2016-11-28 MED ORDER — PANTOPRAZOLE SODIUM 40 MG PO TBEC
40.0000 mg | DELAYED_RELEASE_TABLET | Freq: Every day | ORAL | Status: DC
  Filled 2016-11-28: qty 1

## 2016-11-28 MED ORDER — SODIUM CHLORIDE 0.9 % IV SOLN
510.0000 mg | Freq: Once | INTRAVENOUS | Status: AC
  Administered 2016-11-28: 510 mg via INTRAVENOUS
  Filled 2016-11-28: qty 17

## 2016-11-28 NOTE — Progress Notes (Signed)
Patients heart rate has been maintaining in the mid 30's to 40's. Asymptomatic. MD notified. Also, patient has wounds to bilateral legs that he states were dressed daily at [facility]. MD notified. Short, MD stated that if he is asymptomatic she is ok with that. Will continue to monitor.

## 2016-11-28 NOTE — Progress Notes (Signed)
Sarah, PA stated that since patient received IV Protonix that we could give PO dose starting tomorrow.

## 2016-11-28 NOTE — Progress Notes (Signed)
Patient refused B/P check and hydralazine. MD notified. Will continue to monitor.

## 2016-11-28 NOTE — Progress Notes (Signed)
PROGRESS NOTE  [name]  FYB:[id number] DOB: June 29, 1934 DOA: 11/27/2016 PCP: [name], PA  Brief Narrative:   The patient is an 83 year old male with history of atrial flutter on Eliquis, insulin-dependent diabetes mellitus, chronic kidney disease stage IV, colon cancer s/p right colectomy, hypertension, and chronic diastolic congestive heart failure who presented from a skilled nursing facility for evaluation of low hemoglobin. He become progressively weaker at the skilled nursing facility despite working with physical therapy and workup revealed a hemoglobin of 5.6 g/dL. Occult stool is positive.  The patient in the facility did not notice any melena or hematochezia. He has been recently evaluated by psychiatry for depression and anger.  His Eliquis has been stopped and he has received 2 units of PRBC.  His ferritin is 9 and he will receive feraheme.  Gastroenterology recommended EGD and colonoscopy, however, patient has declined.    Assessment & Plan:   Principal Problem:   GI bleeding Active Problems:   Chronic diastolic CHF (congestive heart failure) ([place])   Uncontrolled diabetes mellitus with diabetic nephropathy, with long-term current use of insulin ([facility])   Atrial flutter ([facility])   Pancytopenia ([facility])   HTN (hypertension)   GI bleed   CKD (chronic kidney disease), stage IV ([place])  1. Symptomatic anemia, occult GI bleed, appropriate increase in hemoglobin.  Likely has iron deficiency from chronic blood loss from possible cancer recurrence or AVM or ulcer superimposed on anemia of CKD.   - Pt presents with Hgb 5.9, pallor, occult blood in stool  - appreciate GI assistance - 2 units of pRBCs ordered for immediate transfusion  - continue to hold Eliquis is held - change to oral PPI -  Agree with additional outpatient iron and epo, possibly changing to [facility] for ease of dosing   2. Atrial flutter, chronic, rate in 30-40s, asympatomatic - CHADS-VASc is 66 (age x2, CHF, HTN,  DM)  - Eliquis held for GIB as above; rate is controlled   3. Pancytopenia  - Pt has a new leukopenia on admission with WBC 3,900; platelets 101k on admission, intermittently low in past  - No evidence for acute infection; not on any of the commonly implicated medications - check spep  4. Chronic diastolic CHF  - Pt appears to have some excess volume on admission but is breathing comfortably and laying nearly flat in ED   - TTE (09/11/16) with EF 52-77%; diastolic function could not be determined  - Managed at home with Lasix 120 mg BID, hydralazine  - Lasix 60 mg IV to be given between the units of blood and q12h thereafter  - Continue hydralazine as tolerated  - Follow strict I/O's and daily wts   5. CKD stage IV - SCr is 3.12 on admission, appears consistent with baseline  - Renally-dose medications, avoid dehydration and nephrotoxins where possible   6. Hypertension, declining BP - DBP soft on admission - Resume hydralazine in am as tolerated   7. Insulin-dependent DM  - A1c was 6.4% in January 2018  - Managed at home with Novolog 5-10 units TID  - Check CBG with meals and qHS - Continue low-intensity Novolog correctional    DVT prophylaxis: SCD  Code Status: Full  Family Communication: Discussed with patient Disposition Plan:   Likely discharge tomorrow if hemoglobin stable OFF of eliquis for now until hemoglobin stability has been proven.    Consultants:   [place] gastro[facility]  Procedures:  2 unit PRBC transfusion  Antimicrobials:  Anti-infectives  None       Subjective:  Upset that he had blood work check multiple times.  Incontinent of urine this morning.  Confused.  Denies pain, SOB, nausea.    Objective: Vitals:   11/28/16 0300 11/28/16 0338 11/28/16 0620 11/28/16 0854  BP: (!) 140/51 (!) 150/41 (!) 148/48 (!) 154/52  Pulse: (!) 40   69  Resp: 16 16 16 18   Temp: 99.1 F (37.3 C) 98.7 F (37.1 C) 98.7 F (37.1 C) 98.7 F (37.1 C)    TempSrc: Oral Oral Oral Oral  SpO2: 100% 100% 100% 100%  Weight:      Height:        Intake/Output Summary (Last 24 hours) at 11/28/16 1717 Last data filed at 11/28/16 1500  Gross per 24 hour  Intake             1048 ml  Output              925 ml  Net              123 ml   Filed Weights   11/27/16 1710 11/27/16 2131  Weight: 93 kg (205 lb) 97.8 kg (215 lb 11.2 oz)    Examination:  General exam:  Adult male, somewhat agitated during my visit.  No acute distress.  HEENT:  NCAT, MMM Respiratory system: Clear to auscultation bilaterally Cardiovascular system:  IRRR and slow, normal S1/S2. No murmurs, rubs, gallops or clicks.  Warm extremities Gastrointestinal system: Normal active bowel sounds, soft, nondistended, nontender. MSK:  Normal tone and bulk, 2+ pitting bilateral lower extremity edema Neuro:  Grossly moves all extremities    Data Reviewed: I have personally reviewed following labs and imaging studies  CBC:  Recent Labs Lab 11/27/16 1736 11/28/16 1105  WBC 3.9* 3.6*  NEUTROABS 1.0*  --   HGB 5.9* 7.4*  HCT 19.6* 24.0*  MCV 73.1* 74.8*  PLT 101* 91*   Basic Metabolic Panel:  Recent Labs Lab 11/27/16 1736 11/28/16 1105  NA 140 140  K 3.9 3.8  CL 108 109  CO2 22 21*  GLUCOSE 145* 101*  BUN 55* 52*  CREATININE 3.12* 2.89*  CALCIUM 8.6* 8.8*   GFR: Estimated Creatinine Clearance: 24.1 mL/min (A) (by C-G formula based on SCr of 2.89 mg/dL (H)). Liver Function Tests:  Recent Labs Lab 11/27/16 1736  AST 8*  ALT 8*  ALKPHOS 50  BILITOT 0.5  PROT 6.7  ALBUMIN 3.6   No results for input(s): LIPASE, AMYLASE in the last 168 hours. No results for input(s): AMMONIA in the last 168 hours. Coagulation Profile:  Recent Labs Lab 11/27/16 1736  INR 1.34   Cardiac Enzymes:  Recent Labs Lab 11/27/16 1736  TROPONINI <0.03   BNP (last 3 results) No results for input(s): PROBNP in the last 8760 hours. HbA1C: No results for input(s): HGBA1C  in the last 72 hours. CBG:  Recent Labs Lab 11/27/16 2141 11/28/16 0010 11/28/16 0352 11/28/16 0747 11/28/16 1154  GLUCAP 127* 123* 127* 119* 103*   Lipid Profile: No results for input(s): CHOL, HDL, LDLCALC, TRIG, CHOLHDL, LDLDIRECT in the last 72 hours. Thyroid Function Tests: No results for input(s): TSH, T4TOTAL, FREET4, T3FREE, THYROIDAB in the last 72 hours. Anemia Panel:  Recent Labs  11/28/16 1105  VITAMINB12 582  FOLATE 13.9  FERRITIN 9*  TIBC 336  IRON 21*  RETICCTPCT 0.9   Urine analysis:    Component Value Date/Time   COLORURINE YELLOW 11/05/2016 2136   APPEARANCEUR  CLEAR 11/05/2016 2136   LABSPEC 1.010 11/05/2016 2136   PHURINE 5.0 11/05/2016 2136   GLUCOSEU NEGATIVE 11/05/2016 2136   HGBUR NEGATIVE 11/05/2016 2136   BILIRUBINUR NEGATIVE 11/05/2016 2136   KETONESUR NEGATIVE 11/05/2016 2136   PROTEINUR 30 (A) 11/05/2016 2136   UROBILINOGEN 1.0 05/18/2015 1407   NITRITE NEGATIVE 11/05/2016 2136   LEUKOCYTESUR NEGATIVE 11/05/2016 2136   Sepsis Labs: @LABRCNTIP (procalcitonin:4,lacticidven:4)  ) Recent Results (from the past 240 hour(s))  MRSA PCR Screening     Status: None   Collection Time: 11/28/16  1:13 AM  Result Value Ref Range Status   MRSA by PCR NEGATIVE NEGATIVE Final    Comment:        The GeneXpert MRSA Assay (FDA approved for NASAL specimens only), is one component of a comprehensive MRSA colonization surveillance program. It is not intended to diagnose MRSA infection nor to guide or monitor treatment for MRSA infections.       Radiology Studies: No results found.   Scheduled Meds: . atorvastatin  20 mg Oral q1800  . fluticasone  2 spray Each Nare Daily  . furosemide  60 mg Intravenous Q12H  . hydrALAZINE  75 mg Oral Q8H  . insulin aspart  0-9 Units Subcutaneous Q4H  . pantoprazole  40 mg Oral Q0600  . sodium chloride flush  3 mL Intravenous Q12H  . sodium chloride flush  3 mL Intravenous Q12H   Continuous  Infusions:   LOS: 1 day    Time spent: 30 min    Janece Canterbury, MD [facility] Pager [phone]  If 7PM-7AM, please contact night-coverage [email or web address] Password [facility] 11/28/2016, 5:17 PM

## 2016-11-28 NOTE — Consult Note (Signed)
[facility] Consult: 8:07 AM 11/28/2016  LOS: 1 day    Referring Provider: Dr Sheran Fava  Primary Care Physician:  [name], PA Primary Gastroenterologist:  Seen by Adriana Mccallum as inpt in past.     Reason for Consultation:  FOBT + anemia in pt on Eliquis   HPI: [name] is a 83 y.o. male.   Long term SNF resident.   PMH CKD stage 4.  Diastolic CHF.  Obesity.  LE lymphedema.  CAD.  A fib, on Eliquis.   IDDM with nephropathy.  Anemia of chronic disease.  Blood transfusion in 01/2014 when having GI bleeding.   Dementia, agitation, depression.  Echo 08/2016: EF 60 - 65%.  s/p right colectomy 01/2014 for stage 1 (T2, N0) adenocarcinoma. No role for adjuvant chemo per Dr Ammie [name].    05/2012 EGD.  For melena and FOBT +: LA grade A esophagitis.  Mild gastritis. 05/2012 Colonoscopy for IDA and cancer screening.  Six  5 - 7 mm polyps removed from throughout colon (tubular adenomas).  1 large, multilobulated cecal mass (tubular adenoma).   01/2014 Colonoscopy for rectal bleeding, IDA, hx polyps. Large villous polyp (adenocarcinoma) at cecum with overlying clot.  6 mm right colon polyp not removed.  Small recto-sigmoid polyp bx'd (hyperplastic).  Int rrhoids.  01/2014 Laparoscopic right colectomy.  Path: adenocarcinoma extending to muscularis.  Tubular adenomas x 5. Lymph nodes x 12 all negative for mets.   09/10/2016 CT Chest/Ab/Pelvis: anasarca/volume overload.  Pericardial effusion, atelectasis.  No cancer/mets 08/2016 KUB: adynamic ileus of SB and colon. RUQ calcification, likely large gallstone.   Given Aranesp by renal during 08/2016 admission. Plan was for weekly Aranesp but this never happened.  Not on any iron.    Sent to ED for eval of anemia.  Hgb 09/20/16: 8.7, yesterday 5.9.  MCV 73.  Platelets 101.  FOBT +  (Negative in 08/2016).  Renal fx close to baseline.  Iron studies pending.  s/p PRBC x  Has had no melena, BPR, n/v, hematemesis. He says his appetite is good. He denies abdominal pain. He is not too pleased or yet willing to undergo upper endoscopy or colonoscopy when these were offered to the patient. Per review of weight recordings, he's gone from 238 to 215#  within the last 2 months. However would take this with a gr of salt since corded weights of 206 # and then 215# documented within the last 3 days.     Past Medical History:  Diagnosis Date  . Atrial fibrillation ([place])   . CHF (congestive heart failure) ([place])   . Chronic kidney disease   . Coronary artery disease   . Diabetes mellitus without complication ([place])   . Hypertension     Past Surgical History:  Procedure Laterality Date  . COLONOSCOPY  06/21/2012   Procedure: COLONOSCOPY;  Surgeon: Juanita Craver, MD;  Location: [facility] ENDOSCOPY;  Service: Endoscopy;  Laterality: N/A;  . COLONOSCOPY Left 02/13/2014   Procedure: COLONOSCOPY;  Surgeon: Juanita Craver, MD;  Location: [facility] ENDOSCOPY;  Service: Endoscopy;  Laterality: Left;  .  ESOPHAGOGASTRODUODENOSCOPY  06/19/2012   Procedure: ESOPHAGOGASTRODUODENOSCOPY (EGD);  Surgeon: Beryle Beams, MD;  Location[facility] ENDOSCOPY;  Service: Endoscopy;  Laterality: N/A;  . LAPAROSCOPIC RIGHT COLECTOMY Right 02/15/2014   Procedure: LAPAROSCOPIC ASISSTED RIGHT  COLECTOMY, POSSIBLE OPEN;  Surgeon: Rolm Bookbinder, MD;  Location: [place];  Service: General;  Laterality: Right;  . LEG SURGERY     Left leg surgery. broken leg    Prior to Admission medications   Medication Sig Start Date End Date Taking? Authorizing Provider  apixaban (ELIQUIS) 2.5 MG TABS tablet Take 1 tablet (2.5 mg total) by mouth 2 (two) times daily. 12/12/15  Yes Ripudeep Krystal Eaton, MD  atorvastatin (LIPITOR) 20 MG tablet Take 1 tablet (20 mg total) by mouth daily at 6 PM. 12/12/15  Yes Ripudeep K Rai, MD  fluticasone (FLONASE) 50 MCG/ACT  nasal spray Place 2 sprays into both nostrils daily.   Yes Historical Provider, MD  furosemide (LASIX) 40 MG tablet Take 3 tablets (120 mg total) by mouth 2 (two) times daily. 09/18/16  Yes Eber Jones, MD  hydrALAZINE (APRESOLINE) 25 MG tablet Take 3 tablets (75 mg total) by mouth every 8 (eight) hours. 09/18/16  Yes Eber Jones, MD  HYDROcodone-acetaminophen (NORCO/VICODIN) 5-325 MG tablet Take 1 tablet by mouth every 6 (six) hours as needed for moderate pain.   Yes Historical Provider, MD  insulin aspart (NOVOLOG) 100 UNIT/ML injection Inject 0-10 Units into the skin See admin instructions. Inject 0-10 units SQ 3 times daily before meals per sliding scale. 0-69 = begin hypoglycemic protocol, 70-149 = 0 units, 150-200 = 5 units, 201-300 = 8 units, 301-400 = 10 units, > 400 = give 10 units and call MD/NP   Yes Historical Provider, MD  polyethylene glycol (MIRALAX / GLYCOLAX) packet Take 17 g by mouth daily.   Yes Historical Provider, MD  senna (SENOKOT) 8.6 MG TABS tablet Take 2 tablets by mouth at bedtime.   Yes Historical Provider, MD    Scheduled Meds: . atorvastatin  20 mg Oral q1800  . fluticasone  2 spray Each Nare Daily  . furosemide  60 mg Intravenous Q12H  . hydrALAZINE  75 mg Oral Q8H  . insulin aspart  0-9 Units Subcutaneous Q4H  . pantoprazole (PROTONIX) IV  40 mg Intravenous Q12H  . sodium chloride flush  3 mL Intravenous Q12H  . sodium chloride flush  3 mL Intravenous Q12H   Infusions:  PRN Meds: sodium chloride, acetaminophen **OR** acetaminophen, HYDROcodone-acetaminophen, ondansetron **OR** ondansetron (ZOFRAN) IV, sodium chloride flush   Allergies as of 11/27/2016 - Review Complete 11/27/2016  Allergen Reaction Noted  . Metformin and related  07/22/2016    Family History  Problem Relation Age of Onset  . Brain cancer Mother   . Diabetes type II Sister   . Narcolepsy Paternal Uncle     Social History   Social History  . Marital status: Widowed     Spouse name: N/A  . Number of children: N/A  . Years of education: N/A   Occupational History  . Retired    Social History Main Topics  . Smoking status: Former Smoker    Quit date: 08/26/1998  . Smokeless tobacco: Never Used  . Alcohol use No     Comment: in the past  . Drug use: No  . Sexual activity: No   Other Topics Concern  . Not on file   Social History Narrative   Widower-   Has #2 children and #2 grandchildren and  a great grandchild   Retired from [employer] (15 years ago)   Currently resides at [place] with walker-    REVIEW OF SYSTEMS: Constitutional:  Weakness reported at nursing home. ENT:  No nose bleeds Pulm:  Denies shortness of breath or cough. CV:  No palpitations, no LE edema. No chest pain. GU:  No hematuria, no frequency.  Unable to determine if he's had oliguria. GI:  Patient denies dysphagia. Heartburn, nausea, abdominal pain, diarrhea. Sometimes gets constipated. Heme:  No unusual bleeding or bruising.   Transfusions:  Per HPI Neuro:  No headaches, no peripheral tingling or numbness Derm:  No itching, no rash or sores.  Endocrine:  No sweats or chills.  No polyuria or dysuria Immunization:  Vaccinations reviewed. He is up-to-date on multiple immunizations including his flu shot performed in 08/2016 Travel:  None beyond local counties in last few months.    PHYSICAL EXAM: Vital signs in last 24 hours: Vitals:   11/28/16 0338 11/28/16 0620  BP: (!) 150/41 (!) 148/48  Pulse:    Resp: 16 16  Temp: 98.7 F (37.1 C) 98.7 F (37.1 C)   Wt Readings from Last 3 Encounters:  11/27/16 97.8 kg (215 lb 11.2 oz)  11/25/16 93.5 kg (206 lb 3.2 oz)  10/03/16 108 kg (238 lb)    General: Obese, chronically ill, frail looking elderly [name]. Cantankerous Head:  Some facial edema. No asymmetry. No signs of head trauma.  Eyes:  No conjunctival pallor. No scleral icterus. Ears:  No obvious hearing deficits.  Nose:  No  discharge or congestion. Mouth:  Upper denture in place. He is edentulous. Oral mucosa is moist, pink and clear. Tongue is midline. Neck:  No masses, no JVD. Lungs:  Clear bilaterally. Reduced overall breath sounds. No cough. No dyspnea. No wet vocal quality. Heart: Irregularly irregular. Rate not accelerated. No MRG. S1, S2 present. Abdomen:  Obese. No masses, hernias or bruits. Bowel sounds normal but hypoactive. No HSM. Not tender..   Rectal: Patient would not allow rectal exam   Musc/Skeltl: No joint erythema or gross deformity Extremities:  Lymphedema in both lower extremities. On the right he has some erythema of the lower leg into the foot. Feet/legs are tender to the touch more so on the right. Extensive woody changes. No ulcers.  Neurologic:  Patient is alert. Oriented times 3. He doesn't want to speak a lot but when he does he's totally appropriate. Slight resting tremor in the right hand. Grip/arm strength is full bilaterally. It hurt him to test his lower extremity strength so I did not do this. Skin:  Woody changes in the lower legs. Tattoos:  None observed.   Psych:  Somewhat angry.  Intake/Output from previous day: 04/04 0701 - 04/05 0700 In: 925 [P.O.:240; Blood:685] Out: 525 [Urine:525] Intake/Output this shift: No intake/output data recorded.  LAB RESULTS:  Recent Labs  11/27/16 1736  WBC 3.9*  HGB 5.9*  HCT 19.6*  PLT 101*   BMET Lab Results  Component Value Date   NA 140 11/27/2016   NA 139 09/20/2016   NA 138 09/18/2016   K 3.9 11/27/2016   K 4.6 09/20/2016   K 4.9 09/18/2016   CL 108 11/27/2016   CL 101 09/18/2016   CL 101 09/17/2016   CO2 22 11/27/2016   CO2 26 09/18/2016   CO2 27 09/17/2016   GLUCOSE 145 (H) 11/27/2016   GLUCOSE 138 (H) 09/18/2016   GLUCOSE 140 (H) 09/17/2016  BUN 55 (H) 11/27/2016   BUN 45 (A) 09/20/2016   BUN 47 (H) 09/18/2016   CREATININE 3.12 (H) 11/27/2016   CREATININE 3.6 (A) 09/20/2016   CREATININE 3.20 (H)  09/18/2016   CALCIUM 8.6 (L) 11/27/2016   CALCIUM 8.2 (L) 09/18/2016   CALCIUM 8.2 (L) 09/17/2016   LFT  Recent Labs  11/27/16 1736  PROT 6.7  ALBUMIN 3.6  AST 8*  ALT 8*  ALKPHOS 50  BILITOT 0.5   PT/INR Lab Results  Component Value Date   INR 1.34 11/27/2016   INR 1.10 10/20/2014   INR 1.06 02/11/2014   Hepatitis Panel No results for input(s): HEPBSAG, HCVAB, HEPAIGM, HEPBIGM in the last 72 hours. C-Diff No components found for: CDIFF Lipase     Component Value Date/Time   LIPASE 30 12/04/2015 1431        RADIOLOGY STUDIES: No results found.    IMPRESSION:   *  Acute on chronic microcytic anemia.  Multifactorial.  Advanced stage 4 CKD.  Initially treated in January 2018 with a dose of Neupogen and the plan was for this to be a weekly occurrence. However patient never followed up with renal. Stool is FOBT positive but there's been no overt bleeding.  *  Thrombocytopenia.  No liver dz on CT in 08/2016.    *  A fib/flutter.  Chronic Eliquis on hold  *  s/p right colectomy for colon cancer in 2016.  Has not undergone follow-up colonoscopy.    PLAN:     *  For the time being I'm letting him eat a carb modified diet. It's going to take some cajoling to get him to consent to upper endoscopy. Colonoscopy is going to be problematic given that he probably is not going to want to complete a bowel prep.  Given his overall poor prognosis, unless he is a dialysis candidate, not sure he needs to undergo extensive GI evaluation. But given that he is on Eliquis, we probably are obligated to perform upper and lower endoscopy. Another option is to discontinue his Eliquis altogether.  *  Stopped the IV Protonix and placed him on once daily oral Protonix.  *  Need to get either hematologic or renal involvement to deal with his Epogen dosing. Once he returns to the SNF, they need to be made aware that he needs to get to follow-up appointments to receive his Neupogen. Since  it is a twice monthly dose rather than the weekly dose of Epogen, would Mircera be a better choice and, would Medicare/Medicaid pay for it?    Azucena Freed  11/28/2016, 8:07 AM Pager: [phone]   Attending physician's note   I have taken a history, examined the patient and reviewed the chart. I agree with the Advanced Practitioner's note, impression and recommendations. 83 year old male with history of colon cancer stage I 2015 status post resection, stage IV CKD, A. fib on liquids, chronic anemia admitted with worsening anemia and heme positive stool. No overt GI bleed. Patient absolutely refused EGD or colonoscopy for evaluation of heme-positive stool and anemia. He does not want to undergo any procedures. I explained to patient that it is our recommendation to evaluate for possible source of GI bleed, he is past due for surveillance colonoscopy.  Please call us back if patient is willing to undergo EGD and colonoscopy  K Denzil Magnuson, MD [phone] Mon-Fri 8a-5p [phone] after 5p, weekends, holidays

## 2016-11-28 NOTE — Progress Notes (Signed)
Called into the patient's room,per CCMD,his heart rare was 31 beats per minute.Assessed,apical heart rate of 42 beats per minute,he is awake alert and oriented to person and place ,slow verbal response.No complaint and discomfort.Warm skin and all extremities.Will monitor.

## 2016-11-29 ENCOUNTER — Encounter (HOSPITAL_COMMUNITY): Payer: Self-pay | Admitting: General Practice

## 2016-11-29 LAB — BASIC METABOLIC PANEL
ANION GAP: 11 (ref 5–15)
BUN: 53 mg/dL — ABNORMAL HIGH (ref 6–20)
CO2: 24 mmol/L (ref 22–32)
Calcium: 9 mg/dL (ref 8.9–10.3)
Chloride: 107 mmol/L (ref 101–111)
Creatinine, Ser: 2.98 mg/dL — ABNORMAL HIGH (ref 0.61–1.24)
GFR calc non Af Amer: 18 mL/min — ABNORMAL LOW (ref >60)
GFR, EST AFRICAN AMERICAN: 21 mL/min — AB (ref >60)
GLUCOSE: 105 mg/dL — AB (ref 65–99)
POTASSIUM: 3.8 mmol/L (ref 3.5–5.1)
Sodium: 142 mmol/L (ref 135–145)

## 2016-11-29 LAB — GLUCOSE, CAPILLARY
GLUCOSE-CAPILLARY: 109 mg/dL — AB (ref 65–99)
Glucose-Capillary: 175 mg/dL — ABNORMAL HIGH (ref 65–99)

## 2016-11-29 LAB — TYPE AND SCREEN
ABO/RH(D): O POS
Antibody Screen: NEGATIVE
UNIT DIVISION: 0
Unit division: 0

## 2016-11-29 LAB — CBC
HEMATOCRIT: 25.8 % — AB (ref 39.0–52.0)
HEMOGLOBIN: 8 g/dL — AB (ref 13.0–17.0)
MCH: 23.3 pg — AB (ref 26.0–34.0)
MCHC: 31 g/dL (ref 30.0–36.0)
MCV: 75 fL — ABNORMAL LOW (ref 78.0–100.0)
Platelets: 92 10*3/uL — ABNORMAL LOW (ref 150–400)
RBC: 3.44 MIL/uL — AB (ref 4.22–5.81)
RDW: 18.2 % — ABNORMAL HIGH (ref 11.5–15.5)
WBC: 4 10*3/uL (ref 4.0–10.5)

## 2016-11-29 LAB — BPAM RBC
BLOOD PRODUCT EXPIRATION DATE: 201804292359
Blood Product Expiration Date: 201804292359
ISSUE DATE / TIME: 201804042004
ISSUE DATE / TIME: 201804050308
UNIT TYPE AND RH: 5100
Unit Type and Rh: 5100

## 2016-11-29 MED ORDER — PANTOPRAZOLE SODIUM 40 MG PO TBEC: 40.0000 mg | DELAYED_RELEASE_TABLET | Freq: Every day | ORAL | 0 refills | Status: DC

## 2016-11-29 MED ORDER — FERROUS SULFATE 325 (65 FE) MG PO TBEC: 325.0000 mg | DELAYED_RELEASE_TABLET | Freq: Three times a day (TID) | ORAL | 0 refills | Status: DC

## 2016-11-29 MED ORDER — HYDROCODONE-ACETAMINOPHEN 5-325 MG PO TABS: 1.0000 | ORAL_TABLET | Freq: Four times a day (QID) | ORAL | 0 refills | Status: DC | PRN

## 2016-11-29 NOTE — Progress Notes (Signed)
Patient refused to have CBG checked.

## 2016-11-29 NOTE — Discharge Summary (Addendum)
Physician Discharge Summary  [name] YNW:[id number] DOB: 1934-06-06 DOA: 11/27/2016  PCP: [name], PA  Admit date: 11/27/2016 Discharge date: 11/29/2016  Admitted From: SNF  Disposition:  SNF  Recommendations for Outpatient Follow-up:  1. Follow up with Gastroenterology in 1 week.  Patient has been seen by Drs. Hung/Mann before but was seen by Dr. Silverio Decamp during this hospitalization.  He may choose who he prefers to see.  Needs to be scheduled for EGD and colonoscopy. 2. Stopped Eliquis pending endoscopy 3. Please replace unna boots 4. Palliative care consultation for goals of care 5. SPEP pending 6. Nephrologist (or supervising physician) to consider starting Mircera or increasing iron and epo infusions if possible.    Home Health:  PT/OT  Equipment/Devices:  none  Discharge Condition:  Stable, improved CODE STATUS:  Full code  Diet recommendation:  Dysphagia 2 (doesn't have bottom teeth, not because of aspiration)   Brief/Interim Summary:  The patient is an 83 year old male with history of atrial flutter on Eliquis, insulin-dependent diabetes mellitus, chronic kidney disease stage IV, colon cancer s/p right colectomy, hypertension, and chronic diastolic congestive heart failure who presented from a skilled nursing facility for evaluation of low hemoglobin. He become progressively weaker at the skilled nursing facility despite working with physical therapy and workup revealed a hemoglobin of 5.6 g/dL. Occult stool is positive.  The patient in the facility did not notice any melena or hematochezia. He has been recently evaluated by psychiatry for depression and anger.  His Eliquis has been stopped and he has received 2 units of PRBC.  His ferritin is 9 and he received feraheme and was started on oral iron supplementation.  Gastroenterology recommended EGD and colonoscopy, however, patient told them he did not want the procedure.  The following day, he stated that he would be willing  to undergo endoscopies even though he does not want to.  Recommended that he follow up as an outpatient to schedule these procedures.  Of note, he has been refusing CBGs and medications during his hospitalization.    Discharge Diagnoses:  Principal Problem:   GI bleeding Active Problems:   Chronic diastolic CHF (congestive heart failure) (HCC)   Diabetes mellitus with diabetic nephropathy, controlled, with long-term current use of insulin (HCC)   Atrial flutter (HCC)   Pancytopenia (HCC)   HTN (hypertension)   GI bleed   CKD (chronic kidney disease), stage IV ([place])  1. Symptomatic anemia, occult GI bleed, appropriate increase in hemoglobin.  Likely has iron deficiency from chronic blood loss from possible cancer recurrence or AVM or ulcer superimposed on anemia of CKD.  He also has a history of colon cancer and did not receive post-colectomy surveillance. - Initial Hgb 5.9, pallor, occult blood in stool  - 2 units of pRBCs given with appropriate increase in hemoglobin -  discontinued Eliquis -  Given feraheme x 1 and started on oral iron -  Recommend that he start Mircera or have adjustments made to his epo to reduce risk of future transfusions -  Started oral PPI  2. Atrial flutter, chronic, rate in 30-40s, asympatomatic - CHADS-VASc is 47 (age x2, CHF, HTN, DM)  - Eliquis held for GIB as above; rate is controlled  3. Pancytopenia  - Pt has a new leukopenia on admission with WBC 3,900; platelets 101k on admission, intermittently low in past  - No evidence for acute infection; not on any of the commonly implicated medications - spep pending  4. Chronic diastolic CHF  - Pt  appears to have some excess volume on admission but is breathing comfortably and laying nearly flat in ED  - TTE (09/11/16) with EF 51-02%; diastolic function could not be determined  -  Continued Lasix 120 mg BID but patient refused some doses, hydralazine  -  Replace unna boots at SNF  5. CKD stage IV -  SCr is 3.12 on admission, appears consistent with baseline   6. Hypertension, stable  7. Insulin-dependent diabetes mellitus, well controlled.  Mild hyperglycemia at admission due to stress. - A1c was 6.4% in January 2018  - Managed with Novolog 5-10 units TID as outpatient and low dose SSI as inpatient  Discharge Instructions  Discharge Instructions    (HEART FAILURE PATIENTS) Call MD:  Anytime you have any of the following symptoms: 1) 3 pound weight gain in 24 hours or 5 pounds in 1 week 2) shortness of breath, with or without a dry hacking cough 3) swelling in the hands, feet or stomach 4) if you have to sleep on extra pillows at night in order to breathe.    Complete by:  As directed    Call MD for:  difficulty breathing, headache or visual disturbances    Complete by:  As directed    Call MD for:  extreme fatigue    Complete by:  As directed    Call MD for:  hives    Complete by:  As directed    Call MD for:  persistant dizziness or light-headedness    Complete by:  As directed    Call MD for:  persistant nausea and vomiting    Complete by:  As directed    Call MD for:  severe uncontrolled pain    Complete by:  As directed    Call MD for:  temperature >100.4    Complete by:  As directed    Diet - low sodium heart healthy    Complete by:  As directed        Medication List    STOP taking these medications   apixaban 2.5 MG Tabs tablet Commonly known as:  ELIQUIS     TAKE these medications   atorvastatin 20 MG tablet Commonly known as:  LIPITOR Take 1 tablet (20 mg total) by mouth daily at 6 PM.   ferrous sulfate 325 (65 FE) MG EC tablet Take 1 tablet (325 mg total) by mouth 3 (three) times daily with meals.   fluticasone 50 MCG/ACT nasal spray Commonly known as:  FLONASE Place 2 sprays into both nostrils daily.   furosemide 40 MG tablet Commonly known as:  LASIX Take 3 tablets (120 mg total) by mouth 2 (two) times daily.   hydrALAZINE 25 MG  tablet Commonly known as:  APRESOLINE Take 3 tablets (75 mg total) by mouth every 8 (eight) hours.   HYDROcodone-acetaminophen 5-325 MG tablet Commonly known as:  NORCO/VICODIN Take 1 tablet by mouth every 6 (six) hours as needed for moderate pain.   insulin aspart 100 UNIT/ML injection Commonly known as:  novoLOG Inject 0-10 Units into the skin See admin instructions. Inject 0-10 units SQ 3 times daily before meals per sliding scale. 0-69 = begin hypoglycemic protocol, 70-149 = 0 units, 150-200 = 5 units, 201-300 = 8 units, 301-400 = 10 units, > 400 = give 10 units and call MD/NP   pantoprazole 40 MG tablet Commonly known as:  PROTONIX Take 1 tablet (40 mg total) by mouth daily at 6 (six) AM. Start taking on:  11/30/2016  polyethylene glycol packet Commonly known as:  MIRALAX / GLYCOLAX Take 17 g by mouth daily.   senna 8.6 MG Tabs tablet Commonly known as:  SENOKOT Take 2 tablets by mouth at bedtime.      Follow-up Information    HUNG,PATRICK D, MD. Schedule an appointment as soon as possible for a visit in 1 week(s).   Specialty:  Gastroenterology Contact information: [place], [place] [address] [phone]        Harl Bowie, MD Follow up.   Specialty:  Gastroenterology Contact information: [place] [address] [place], [place]. Schedule an appointment as soon as possible for a visit in 2 week(s).   Specialty:  Internal Medicine Contact information: [address] [place] [address] [phone]          Allergies  Allergen Reactions  . Metformin And Related Other (See Comments)    Reaction: pt states he thinks he threw up    Consultations: [place] [facility]   Procedures/Studies: No results found.  none   Subjective: Asking when his colonoscopy will happen.  Still having some pain in his legs.  Feels hungry.    Discharge Exam: Vitals:   11/29/16  0615 11/29/16 0810  BP: (!) 154/42 (!) 163/54  Pulse: (!) 40 (!) 39  Resp: 16 18  Temp: 98 F (36.7 C) 98.2 F (36.8 C)   Vitals:   11/28/16 2248 11/29/16 0004 11/29/16 0615 11/29/16 0810  BP: (!) 178/51  (!) 154/42 (!) 163/54  Pulse:  (!) 42 (!) 40 (!) 39  Resp:   16 18  Temp:   98 F (36.7 C) 98.2 F (36.8 C)  TempSrc:   Oral Oral  SpO2:   100% 100%  Weight:      Height:        General exam:  Adult male, somewhat agitated during my visit.  No acute distress.  HEENT:  NCAT, MMM Respiratory system: Clear to auscultation bilaterally Cardiovascular system:  IRRR and slow, normal S1/S2. No murmurs, rubs, gallops or clicks.  Warm extremities Gastrointestinal system: Normal active bowel sounds, soft, nondistended, mild TTP in the right lower quadrant without rebound or guarding. MSK:  Normal tone and bulk, 3+ pitting bilateral lower extremity edema and very TTP without erythema to suggest infection Neuro:  Grossly moves all extremities    The results of significant diagnostics from this hospitalization (including imaging, microbiology, ancillary and laboratory) are listed below for reference.     Microbiology: Recent Results (from the past 240 hour(s))  MRSA PCR Screening     Status: None   Collection Time: 11/28/16  1:13 AM  Result Value Ref Range Status   MRSA by PCR NEGATIVE NEGATIVE Final    Comment:        The GeneXpert MRSA Assay (FDA approved for NASAL specimens only), is one component of a comprehensive MRSA colonization surveillance program. It is not intended to diagnose MRSA infection nor to guide or monitor treatment for MRSA infections.      Labs: BNP (last 3 results)  Recent Labs  07/22/16 1933 09/05/16 0857 11/27/16 1736  BNP 268.7* 557.0* 973.5*   Basic Metabolic Panel:  Recent Labs Lab 11/27/16 1736 11/28/16 1105 11/29/16 0601  NA 140 140 142  K 3.9 3.8 3.8  CL 108 109 107  CO2 22 21* 24  GLUCOSE 145* 101* 105*  BUN 55* 52* 53*   CREATININE 3.12*  2.89* 2.98*  CALCIUM 8.6* 8.8* 9.0   Liver Function Tests:  Recent Labs Lab 11/27/16 1736  AST 8*  ALT 8*  ALKPHOS 50  BILITOT 0.5  PROT 6.7  ALBUMIN 3.6   No results for input(s): LIPASE, AMYLASE in the last 168 hours. No results for input(s): AMMONIA in the last 168 hours. CBC:  Recent Labs Lab 11/27/16 1736 11/28/16 1105 11/29/16 0601  WBC 3.9* 3.6* 4.0  NEUTROABS 1.0*  --   --   HGB 5.9* 7.4* 8.0*  HCT 19.6* 24.0* 25.8*  MCV 73.1* 74.8* 75.0*  PLT 101* 91* 92*   Cardiac Enzymes:  Recent Labs Lab 11/27/16 1736  TROPONINI <0.03   BNP: Invalid input(s): POCBNP CBG:  Recent Labs Lab 11/28/16 1743 11/28/16 2024 11/28/16 2225 11/29/16 0001 11/29/16 0408  GLUCAP 133* 135* 136* 124* 109*   D-Dimer No results for input(s): DDIMER in the last 72 hours. Hgb A1c No results for input(s): HGBA1C in the last 72 hours. Lipid Profile No results for input(s): CHOL, HDL, LDLCALC, TRIG, CHOLHDL, LDLDIRECT in the last 72 hours. Thyroid function studies No results for input(s): TSH, T4TOTAL, T3FREE, THYROIDAB in the last 72 hours.  Invalid input(s): FREET3 Anemia work up  Recent Labs  11/28/16 1105  VITAMINB12 582  FOLATE 13.9  FERRITIN 9*  TIBC 336  IRON 21*  RETICCTPCT 0.9   Urinalysis    Component Value Date/Time   COLORURINE YELLOW 11/05/2016 2136   APPEARANCEUR CLEAR 11/05/2016 2136   LABSPEC 1.010 11/05/2016 2136   PHURINE 5.0 11/05/2016 2136   GLUCOSEU NEGATIVE 11/05/2016 2136   HGBUR NEGATIVE 11/05/2016 2136   BILIRUBINUR NEGATIVE 11/05/2016 2136   KETONESUR NEGATIVE 11/05/2016 2136   PROTEINUR 30 (A) 11/05/2016 2136   UROBILINOGEN 1.0 05/18/2015 1407   NITRITE NEGATIVE 11/05/2016 2136   LEUKOCYTESUR NEGATIVE 11/05/2016 2136   Sepsis Labs Invalid input(s): PROCALCITONIN,  WBC,  LACTICIDVEN   Time coordinating discharge: Over 30 minutes  SIGNED:   Janece Canterbury, MD  [facility] 11/29/2016, 1:17  PM Pager   If 7PM-7AM, please contact night-coverage [email or web address] Password [email or web address]

## 2016-11-29 NOTE — Progress Notes (Signed)
Orthopedic Tech Progress Note Patient Details:  [name] 08-25-1934 [id number]  Ortho Devices Type of Ortho Device: Haematologist Ortho Device/Splint Location: Production assistant, radio to pt right and left legs. pt tolerated well.  Right and left Leg. Ortho Device/Splint Interventions: Application   [name], [name] 11/29/2016, 9:21 PM

## 2016-11-29 NOTE — Progress Notes (Signed)
Pt refuses to take medication: Lasix, Hydralazine and protonix. Pt educated on necessity to take medication but continued to refuse. Will continue to monitor.  Wynema Birch, RN

## 2016-11-29 NOTE — NC FL2 (Signed)
[place] LEVEL OF CARE SCREENING TOOL     IDENTIFICATION  Patient Name: [name] Birthdate: 06/19/1934 Sex: male Admission Date (Current Location): 11/27/2016  [facility] and [place] Number:  Herbalist and Address:  [facility] [place], [place] [address], [place], [place] [address]      Provider Number: [id number]  Attending Physician Name and Address:  Janece Canterbury, MD  Relative Name and Phone Number:       Current Level of Care: Hospital Recommended Level of Care: [place] Prior Approval Number:    Date Approved/Denied:   PASRR Number: [id number]  Discharge Plan: SNF    Current Diagnoses: Patient Active Problem List   Diagnosis Date Noted  . GI bleeding 11/27/2016  . GI bleed 11/27/2016  . CKD (chronic kidney disease), stage IV ([place]) 11/27/2016  . Pressure injury of skin 09/18/2016  . Leukocytosis   . Mobitz type 1 second degree AV block 09/11/2016  . Pericardial effusion- moderate on CT scan 09/11/2016  . AKI (acute kidney injury) ([place]) 09/05/2016  . HTN (hypertension) 09/05/2016  . Acute urinary retention 09/05/2016  . Severe obesity (BMI >= 40) ([place]) 09/05/2016  . Acute CVA (cerebrovascular accident) ([place]) 12/09/2015  . Bradycardia 12/08/2015  . Fall 12/08/2015  . Pancytopenia ([place]) 12/08/2015  . Atrial flutter ([place]) 08/29/2015  . Hypoxia 08/28/2015  . PAF (paroxysmal atrial fibrillation) ([place])   . Noncompliance with medications 08/03/2015  . Chronic diastolic CHF (congestive heart failure) ([place]) 07/31/2015  . Cellulitis of left lower extremity 07/31/2015  . Anemia of chronic renal failure, stage 3 (moderate) 07/31/2015  . Uncontrolled diabetes mellitus with diabetic nephropathy, with long-term current use of insulin ([place]) 07/31/2015  . Accelerated hypertension 07/31/2015  . Thrombocytopenia ([place]) 07/31/2015  . Acute renal failure superimposed on stage 3 chronic kidney disease ([place]) 11/29/2014   . Lymphedema 06/25/2011    Orientation RESPIRATION BLADDER Height & Weight     Self, Time, Situation, Place  Normal Continent Weight: 196 lb 6.4 oz (89.1 kg) Height:  5' 10.5" (179.1 cm)  BEHAVIORAL SYMPTOMS/MOOD NEUROLOGICAL BOWEL NUTRITION STATUS      Continent Diet (see DC summary)  AMBULATORY STATUS COMMUNICATION OF NEEDS Skin   Extensive Assist Verbally Normal                       Personal Care Assistance Level of Assistance  Bathing, Dressing Bathing Assistance: Maximum assistance   Dressing Assistance: Maximum assistance     Functional Limitations Info             SPECIAL CARE FACTORS FREQUENCY  PT (By licensed PT), OT (By licensed OT)     PT Frequency: 5/wk OT Frequency: 5/wk            Contractures      Additional Factors Info  Code Status, Allergies Code Status Info: FULL Allergies Info: Metformin And Related           Current Medications (11/29/2016):  This is the current hospital active medication list Current Facility-Administered Medications  Medication Dose Route Frequency Provider Last Rate Last Dose  . 0.9 %  sodium chloride infusion  250 mL Intravenous PRN Vianne Bulls, MD      . acetaminophen (TYLENOL) tablet 650 mg  650 mg Oral Q6H PRN Vianne Bulls, MD       Or  . acetaminophen (TYLENOL) suppository 650 mg  650 mg Rectal Q6H PRN Vianne Bulls, MD      .  atorvastatin (LIPITOR) tablet 20 mg  20 mg Oral q1800 Vianne Bulls, MD   20 mg at 11/28/16 1758  . fluticasone (FLONASE) 50 MCG/ACT nasal spray 2 spray  2 spray Each Nare Daily Vianne Bulls, MD   2 spray at 11/28/16 1011  . furosemide (LASIX) injection 60 mg  60 mg Intravenous Q12H Vianne Bulls, MD   60 mg at 11/28/16 1802  . hydrALAZINE (APRESOLINE) tablet 75 mg  75 mg Oral Q8H Vianne Bulls, MD   75 mg at 11/28/16 2248  . HYDROcodone-acetaminophen (NORCO/VICODIN) 5-325 MG per tablet 1 tablet  1 tablet Oral Q6H PRN Ilene Qua Opyd, MD      . insulin aspart (novoLOG)  injection 0-9 Units  0-9 Units Subcutaneous Q4H Vianne Bulls, MD   1 Units at 11/29/16 0027  . ondansetron (ZOFRAN) tablet 4 mg  4 mg Oral Q6H PRN Vianne Bulls, MD       Or  . ondansetron (ZOFRAN) injection 4 mg  4 mg Intravenous Q6H PRN Vianne Bulls, MD      . pantoprazole (PROTONIX) EC tablet 40 mg  40 mg Oral Q0600 Vena Rua, PA-C      . sodium chloride flush (NS) 0.9 % injection 3 mL  3 mL Intravenous Q12H Vianne Bulls, MD   3 mL at 11/28/16 2253  . sodium chloride flush (NS) 0.9 % injection 3 mL  3 mL Intravenous Q12H Vianne Bulls, MD   3 mL at 11/28/16 2252  . sodium chloride flush (NS) 0.9 % injection 3 mL  3 mL Intravenous PRN Vianne Bulls, MD         Discharge Medications: Please see discharge summary for a list of discharge medications.  Relevant Imaging Results:  Relevant Lab Results:   Additional Information SSN:  [id number]  Jorge Ny, LCSW

## 2016-11-29 NOTE — Clinical Social Work Note (Signed)
Clinical Social Work Assessment  Patient Details  Name: [name] MRN: [id number] Date of Birth: 07-Oct-1933  Date of referral:  11/29/16               Reason for consult:  Facility Placement                Permission sought to share information with:  Family Supports Permission granted to share information::  Yes, Verbal Permission Granted (Patient was advised after speaking with daughter and Mr. Tessie Fass and did not express disagreement with them being contacted.)  Name::     Hood,Terry-Friend and daughter Rodrigues Urbanek  Agency::     Relationship::  Mr. Tessie Fass - friend and Ms. Hert, daughter  Contact Information:  [phone] - Mr. Tessie Fass and [phone] - Ms. Denbow  Housing/Transportation Living arrangements for the past 2 months:  [place], [place] (Patient moved from [facility] to [facility]) Source of Information:  Engineer, materials, Adult Children, Facility (CSW talked with staff at [facility] and admissions Lexicographer at [facility]) Patient Interpreter Needed:  None Criminal Activity/Legal Involvement Pertinent to Current Situation/Hospitalization:  No - Comment as needed Significant Relationships:  Adult Children, Friend Lives with:  Facility Resident Do you feel safe going back to the place where you live?  Yes Need for family participation in patient care:  Yes (Comment) (Friend Mr. Tessie Fass handles patient's business)  Care giving concerns:  Mr. Tessie Fass explained that patient was requiring more care then [facility] could provided so he was moved to [facility]. He expressed no concerns with [facility], as patient had been there only a week or so before coming to hospital.   Social Worker assessment / plan:  CSW initially talked with patient's daughter Mycal Conde by phone regarding patient's discharge and she requested that his friend Mr. Tessie Fass be contacted. CSW talked with friend regarding discharge and  needed to find a facility that could accept patient today prior to insurance authorization and he was agreeable to patient discharging to [facility] today (4/6). Mr. Tessie Fass advised CSW that he will apply for LTC Medicaid for patient on Monday and what he would need to take with him was discussed. CSW talked with patient and informed him of the change in his facility and he looked at [facility] but had a flat affect and did not share any concerns regarding the change in facilities, however he did express irritation with being given tea and milk with his dinner and requested of CSW that he be brought ginger ale.  Employment status:  Retired Forensic scientist:  Administrator Medicare PPO) PT Recommendations:  [place] / Referral to community resources:  Other (Comment Required) (None needed or requested)  Patient/Family's Response to care:  Mr. Tessie Fass, daughter and patient expressed no concerns regarding the care received during hospitalization.  Patient/Family's Understanding of and Emotional Response to Diagnosis, Current Treatment, and Prognosis:  Not discussed. Mr. Tessie Fass is aware that patient will continue to need LTC in a skilled facility and will be applying for Medicaid for him.  Emotional Assessment Appearance:  Appears stated age Attitude/Demeanor/Rapport:  Other (Quiet) Affect (typically observed):  Flat Orientation:  Oriented to Self, Oriented to Place, Oriented to  Time, Oriented to Situation Alcohol / Substance use:  Tobacco Use, Alcohol Use, Illicit Drugs (Patient reported that he quit smoking and does not drink or use illicit drugs) Psych involvement (Current and /or in the community):  No (Comment)  Discharge Needs  Concerns to be addressed:  Discharge Planning Concerns Readmission within the last 30 days:  No Current discharge risk:  None Barriers to Discharge:  [place], [place], LCSW 11/29/2016, 6:21 PM

## 2016-11-29 NOTE — Evaluation (Signed)
Occupational Therapy Evaluation and Discharge Patient Details Name: [name] MRN: [id number] DOB: 1934-06-01 Today's Date: 11/29/2016    History of Present Illness The patient is an 83 year old male with history of atrial flutter on Eliquis, insulin-dependent diabetes mellitus, chronic kidney disease stage IV, colon cancer s/p right colectomy, hypertension, and chronic diastolic congestive heart failure who presented from a skilled nursing facility for evaluation of low hemoglobin.   Work up underway for GI bleed   Clinical Impression   Pt reports having assistance for bathing, dressing and toileting. He walks with a walker and can self feed. Pt presents with generalized weakness, impaired cognition and decreased standing balance. He complains of LE pain with standing. Plan is for pt to return to SNF. Will defer further OT to SNF's discretion.     Follow Up Recommendations  SNF;Supervision/Assistance - 24 hour    Equipment Recommendations       Recommendations for Other Services       Precautions / Restrictions Precautions Precautions: Fall Restrictions Weight Bearing Restrictions: No      Mobility Bed Mobility      General bed mobility comments: received in chair  Transfers Overall transfer level: Needs assistance Equipment used: Rolling walker (2 wheeled) Transfers: Sit to/from Stand Sit to Stand: Min assist Stand pivot transfers: Min assist       General transfer comment: cues for hand placement and steadying assist from chair, pt initially refusing, but complied when told linens needed straightening    Balance Overall balance assessment: Needs assistance   Sitting balance-Leahy Scale: Good     Standing balance support: Bilateral upper extremity supported Standing balance-Leahy Scale: Poor Standing balance comment: UE support for balance                           ADL either performed or assessed with clinical judgement   ADL Overall  ADL's : At baseline                                       General ADL Comments: Pt demonstrating ability to self feed, wash face and hands and assist with UB dressing.     Vision   Vision Assessment?: No apparent visual deficits     Perception     Praxis      Pertinent Vitals/Pain Pain Assessment: Faces Faces Pain Scale: Hurts even more Pain Location: both feet, lower legs Pain Descriptors / Indicators: Tender Pain Intervention(s): Monitored during session;Repositioned     Hand Dominance Right   Extremity/Trunk Assessment Upper Extremity Assessment Upper Extremity Assessment: Generalized weakness   Lower Extremity Assessment Lower Extremity Assessment: Defer to PT evaluation       Communication Communication Communication: No difficulties   Cognition Arousal/Alertness: Awake/alert Behavior During Therapy: Flat affect Overall Cognitive Status: No family/caregiver present to determine baseline cognitive functioning                                 General Comments: pt with delayed processing, poor historian, refusing medication despite RN educating on benefits, unable to figure out how to use remote for TV   General Comments  Patient needing increased time to respond to all questions, commands and at times agitated, but eventually (after two sessions) cooperative with OOB    Exercises     Shoulder  Instructions      Home Living Family/patient expects to be discharged to:: Skilled nursing facility                                 Additional Comments: per notes from [facility]      Prior Functioning/Environment Level of Independence: Needs assistance  Gait / Transfers Assistance Needed: was using walker and w/c at facility, reports limited recently due to LE pain ADL's / Homemaking Assistance Needed: assist for bathing, dressing, could self feed and participate in grooming            OT Problem List:  Decreased strength;Impaired balance (sitting and/or standing);Decreased cognition;Decreased knowledge of use of DME or AE;Pain      OT Treatment/Interventions:      OT Goals(Current goals can be found in the care plan section) Acute Rehab OT Goals Patient Stated Goal: to watch golf  OT Frequency:     Barriers to D/C:            Co-evaluation              End of Session Equipment Utilized During Treatment: Gait belt;Rolling walker  Activity Tolerance: Patient tolerated treatment well Patient left: in chair;with call bell/phone within reach;with chair alarm set  OT Visit Diagnosis: Unsteadiness on feet (R26.81);Muscle weakness (generalized) (M62.81);Pain;Other symptoms and signs involving cognitive function Pain - part of body: Leg                Time: 2353-6144 OT Time Calculation (min): 19 min Charges:  OT General Charges $OT Visit: 1 Procedure OT Evaluation $OT Eval Moderate Complexity: 1 Procedure G-Codes:       Malka So 11/29/2016, 3:46 PM  [phone]

## 2016-11-29 NOTE — Clinical Social Work Note (Signed)
Patient discharging today to [facility] facility (room 164-private room, per Mr. Tessie Fass), transported by ambulance. Patient from [facility] but unable to return as [employer] authorization not received today. [place] will [employer] authorization and will be provided with an LOG. CSW talked with patient's daughter, Ms. Mones and friend, Mr. Tessie Fass regarding discharge. Mr. Tessie Fass handles patient's affairs and will complete admissions paperwork at [facility].   Abrar Koone Givens, MSW, LCSW Licensed Clinical Social Worker [place] [phone]

## 2016-11-29 NOTE — Discharge Instructions (Signed)
Anemia, Nonspecific Anemia is a condition in which the concentration of red blood cells or hemoglobin in the blood is below normal. Hemoglobin is a substance in red blood cells that carries oxygen to the tissues of the body. Anemia results in not enough oxygen reaching these tissues. What are the causes? Common causes of anemia include:  Excessive bleeding. Bleeding may be internal or external. This includes excessive bleeding from periods (in women) or from the intestine.  Poor nutrition.  Chronic kidney, thyroid, and liver disease.  Bone marrow disorders that decrease red blood cell production.  Cancer and treatments for cancer.  HIV, AIDS, and their treatments.  Spleen problems that increase red blood cell destruction.  Blood disorders.  Excess destruction of red blood cells due to infection, medicines, and autoimmune disorders. What are the signs or symptoms?  Minor weakness.  Dizziness.  Headache.  Palpitations.  Shortness of breath, especially with exercise.  Paleness.  Cold sensitivity.  Indigestion.  Nausea.  Difficulty sleeping.  Difficulty concentrating. Symptoms may occur suddenly or they may develop slowly. How is this diagnosed? Additional blood tests are often needed. These help your health care provider determine the best treatment. Your health care provider will check your stool for blood and look for other causes of blood loss. How is this treated? Treatment varies depending on the cause of the anemia. Treatment can include:  Supplements of iron, vitamin B12, or folic acid.  Hormone medicines.  A blood transfusion. This may be needed if blood loss is severe.  Hospitalization. This may be needed if there is significant continual blood loss.  Dietary changes.  Spleen removal. Follow these instructions at home: Keep all follow-up appointments. It often takes many weeks to correct anemia, and having your health care provider check on your  condition and your response to treatment is very important. Get help right away if:  You develop extreme weakness, shortness of breath, or chest pain.  You become dizzy or have trouble concentrating.  You develop heavy vaginal bleeding.  You develop a rash.  You have bloody or black, tarry stools.  You faint.  You vomit up blood.  You vomit repeatedly.  You have abdominal pain.  You have a fever or persistent symptoms for more than 2-3 days.  You have a fever and your symptoms suddenly get worse.  You are dehydrated. This information is not intended to replace advice given to you by your health care provider. Make sure you discuss any questions you have with your health care provider. Document Released: 09/19/2004 Document Revised: 01/24/2016 Document Reviewed: 02/05/2013 Elsevier Interactive Patient Education  2017 Elsevier Inc.  

## 2016-11-29 NOTE — Evaluation (Signed)
Physical Therapy Evaluation & Discharge Patient Details Name: [name] MRN: [id number] DOB: 01-30-1934 Today's Date: 11/29/2016   History of Present Illness  The patient is an 83 year old male with history of atrial flutter on Eliquis, insulin-dependent diabetes mellitus, chronic kidney disease stage IV, colon cancer s/p right colectomy, hypertension, and chronic diastolic congestive heart failure who presented from a skilled nursing facility for evaluation of low hemoglobin.   Work up underway for GI bleed  Clinical Impression  Patient presents with decreased independence and safety with mobility due to deficits listed in PT problem list.  Feel he will benefit from continued skilled PT in SNF setting to address deficits and maximize mobility prior to d/c.  PT signing off as planned d/c this pm to SNF.     Follow Up Recommendations SNF    Equipment Recommendations  None recommended by PT    Recommendations for Other Services       Precautions / Restrictions Precautions Precautions: Fall      Mobility  Bed Mobility Overal bed mobility: Needs Assistance Bed Mobility: Supine to Sit     Supine to sit: Min guard;HOB elevated     General bed mobility comments: slow, but with minimal help  Transfers Overall transfer level: Needs assistance Equipment used: Rolling walker (2 wheeled) Transfers: Sit to/from [facility] Sit to Stand: Min assist Stand pivot transfers: Min assist       General transfer comment: steadying help and cues for hand placement up from chair, able to take steps with RW to chair, cues for safe stand to sit  Ambulation/Gait                Stairs            Wheelchair Mobility    Modified Rankin (Stroke Patients Only)       Balance Overall balance assessment: Needs assistance   Sitting balance-Leahy Scale: Good     Standing balance support: Bilateral upper extremity supported Standing balance-Leahy Scale:  Poor Standing balance comment: UE support for balance                             Pertinent Vitals/Pain Pain Assessment: Faces Faces Pain Scale: Hurts even more Pain Location: both feet, lower legs Pain Descriptors / Indicators: Tender Pain Intervention(s): Monitored during session;Limited activity within patient's tolerance    Home Living Family/patient expects to be discharged to:: Skilled nursing facility                 Additional Comments: per notes from [facility].  Pt states he lives alone    Prior Function Level of Independence: Needs assistance   Gait / Transfers Assistance Needed: was using walker and w/c at facility, reports limited recently due to LE pain  ADL's / Homemaking Assistance Needed: assist for bathing        Hand Dominance   Dominant Hand: Right    Extremity/Trunk Assessment   Upper Extremity Assessment Upper Extremity Assessment: Generalized weakness    Lower Extremity Assessment Lower Extremity Assessment: Generalized weakness (with LE edema bilaterally)       Communication   Communication: No difficulties  Cognition Arousal/Alertness: Awake/alert Behavior During Therapy: Flat affect Overall Cognitive Status: Within Functional Limits for tasks assessed  General Comments General comments (skin integrity, edema, etc.): Patient needing increased time to respond to all questions, commands and at times agitated, but eventually (after two sessions) cooperative with OOB    Exercises     Assessment/Plan    PT Assessment All further PT needs can be met in the next venue of care  PT Problem List Decreased strength;Pain;Decreased balance;Decreased knowledge of use of DME;Decreased activity tolerance;Decreased knowledge of precautions;Decreased safety awareness;Decreased mobility       PT Treatment Interventions      PT Goals (Current goals can be found in the Care  Plan section)  Acute Rehab PT Goals PT Goal Formulation: All assessment and education complete, DC therapy    Frequency     Barriers to discharge        Co-evaluation               End of Session Equipment Utilized During Treatment: Gait belt Activity Tolerance: Patient limited by fatigue Patient left: in chair;with call bell/phone within reach;with chair alarm set   PT Visit Diagnosis: Unsteadiness on feet (R26.81);Muscle weakness (generalized) (M62.81);Difficulty in walking, not elsewhere classified (R26.2)    Time: 4320-0379 PT Time Calculation (min) (ACUTE ONLY): 35 min   Charges:   PT Evaluation $PT Eval Moderate Complexity: 1 Procedure PT Treatments $Therapeutic Activity: 8-22 mins   PT G Codes:   PT G-Codes **NOT FOR INPATIENT CLASS** Functional Assessment Tool Used: AM-PAC 6 Clicks Basic Mobility Functional Limitation: Mobility: Walking and moving around Mobility: Walking and Moving Around Current Status (K4461): At least 40 percent but less than 60 percent impaired, limited or restricted Mobility: Walking and Moving Around Goal Status [phone]): At least 40 percent but less than 60 percent impaired, limited or restricted Mobility: Walking and Moving Around Discharge Status [phone]): At least 40 percent but less than 60 percent impaired, limited or restricted    [place], [place] 11/29/2016   Reginia Naas 11/29/2016, 3:05 PM

## 2016-11-29 NOTE — Progress Notes (Signed)
Patient refused PO medications today.

## 2016-11-29 NOTE — Progress Notes (Signed)
Report called to [place] at [facility].  All questions answered.

## 2016-12-02 ENCOUNTER — Other Ambulatory Visit: Payer: Self-pay | Admitting: *Deleted

## 2016-12-02 ENCOUNTER — Encounter: Payer: Self-pay | Admitting: Adult Health

## 2016-12-02 ENCOUNTER — Non-Acute Institutional Stay (SKILLED_NURSING_FACILITY): Payer: Medicare HMO | Admitting: Adult Health

## 2016-12-02 DIAGNOSIS — I483 Typical atrial flutter: Secondary | ICD-10-CM | POA: Diagnosis not present

## 2016-12-02 DIAGNOSIS — N184 Chronic kidney disease, stage 4 (severe): Secondary | ICD-10-CM

## 2016-12-02 DIAGNOSIS — E1165 Type 2 diabetes mellitus with hyperglycemia: Secondary | ICD-10-CM | POA: Diagnosis not present

## 2016-12-02 DIAGNOSIS — IMO0002 Reserved for concepts with insufficient information to code with codable children: Secondary | ICD-10-CM

## 2016-12-02 DIAGNOSIS — D61818 Other pancytopenia: Secondary | ICD-10-CM

## 2016-12-02 DIAGNOSIS — E1121 Type 2 diabetes mellitus with diabetic nephropathy: Secondary | ICD-10-CM | POA: Diagnosis not present

## 2016-12-02 DIAGNOSIS — D500 Iron deficiency anemia secondary to blood loss (chronic): Secondary | ICD-10-CM | POA: Insufficient documentation

## 2016-12-02 DIAGNOSIS — K922 Gastrointestinal hemorrhage, unspecified: Secondary | ICD-10-CM | POA: Diagnosis not present

## 2016-12-02 DIAGNOSIS — Z794 Long term (current) use of insulin: Secondary | ICD-10-CM

## 2016-12-02 DIAGNOSIS — I5032 Chronic diastolic (congestive) heart failure: Secondary | ICD-10-CM

## 2016-12-02 LAB — PROTEIN ELECTROPHORESIS, SERUM
A/G RATIO SPE: 1.1 (ref 0.7–1.7)
ALBUMIN ELP: 3.6 g/dL (ref 2.9–4.4)
ALPHA-1-GLOBULIN: 0.2 g/dL (ref 0.0–0.4)
ALPHA-2-GLOBULIN: 0.6 g/dL (ref 0.4–1.0)
BETA GLOBULIN: 0.8 g/dL (ref 0.7–1.3)
Gamma Globulin: 1.5 g/dL (ref 0.4–1.8)
Globulin, Total: 3.2 g/dL (ref 2.2–3.9)
M-Spike, %: 0.5 g/dL — ABNORMAL HIGH
Total Protein ELP: 6.8 g/dL (ref 6.0–8.5)

## 2016-12-02 MED ORDER — HYDROCODONE-ACETAMINOPHEN 5-325 MG PO TABS: 1.0000 | ORAL_TABLET | Freq: Four times a day (QID) | ORAL | 0 refills | Status: DC | PRN

## 2016-12-02 NOTE — Telephone Encounter (Signed)
[facility] #[phone] Fax: [phone] 

## 2016-12-02 NOTE — Progress Notes (Signed)
Location:   [place] Room Number: 164 A Place of Service:  SNF (31)   CODE STATUS: Full Code  Allergies  Allergen Reactions  . Metformin And Related Other (See Comments)    Reaction: pt states he thinks he threw up    Chief Complaint  Patient presents with  . Hospitalization Follow-up    Hospital follow up    HPI:  He has been hospitalized for anemia with a hgb of 5.9; he had a + FOBT. He was transfused with 2 units PRBC. His eliquis has been stopped pending GI for endoscopy and colonoscopy. He has atrial flutter; pancytopenia with spep pending.  He has stage IV renal failure. He is here for short term rehab at this time with his goal to return back to assisted living.    Past Medical History:  Diagnosis Date  . Atrial fibrillation ([place])   . Atrial flutter ([place])    hx/notes 11/28/2016  . CHF (congestive heart failure) ([place])   . Chronic diastolic CHF (congestive heart failure) ([place])    hx/notes 11/28/2016  . CKD (chronic kidney disease), stage IV ([place])    hx/notes 11/28/2016  . Colon cancer ([place])    hx/notes 11/28/2016  . Coronary artery disease   . Depression    hx/notes 11/28/2016  . Diabetic neuropathy ([place])    hx/notes 11/28/2016  . GI bleeding 11/27/2016   notes 11/28/2016  . History of blood transfusion 11/28/2016   hx/notes 11/28/2016  . Hypertension   . IDDM (insulin dependent diabetes mellitus) ([place])    uncontrolled; hx/notes 11/28/2016    Past Surgical History:  Procedure Laterality Date  . COLONOSCOPY  06/21/2012   Procedure: COLONOSCOPY;  Surgeon: Juanita Craver, MD;  Location: [facility] ENDOSCOPY;  Service: Endoscopy;  Laterality: N/A;  . COLONOSCOPY Left 02/13/2014   Procedure: COLONOSCOPY;  Surgeon: Juanita Craver, MD;  Location: [facility] ENDOSCOPY;  Service: Endoscopy;  Laterality: Left;  . ESOPHAGOGASTRODUODENOSCOPY  06/19/2012   Procedure: ESOPHAGOGASTRODUODENOSCOPY (EGD);  Surgeon: Beryle Beams, MD;  Location: [facility] ENDOSCOPY;  Service: Endoscopy;  Laterality:  N/A;  . LAPAROSCOPIC RIGHT COLECTOMY Right 02/15/2014   Procedure: LAPAROSCOPIC ASISSTED RIGHT  COLECTOMY, POSSIBLE OPEN;  Surgeon: Rolm Bookbinder, MD;  Location: [place];  Service: General;  Laterality: Right;  . LEG SURGERY     Left leg surgery. broken leg    Social History   Social History  . Marital status: Widowed    Spouse name: N/A  . Number of children: N/A  . Years of education: N/A   Occupational History  . Retired    Social History Main Topics  . Smoking status: Former Smoker    Quit date: 08/26/1998  . Smokeless tobacco: Never Used  . Alcohol use No     Comment: in the past  . Drug use: No  . Sexual activity: No   Other Topics Concern  . Not on file   Social History Narrative   Widower-   Has #2 children and #2 grandchildren and a great grandchild   Retired from [employer] (15 years ago)   Currently resides at [facility] and [facility] with walker-   Family History  Problem Relation Age of Onset  . Brain cancer Mother   . Diabetes type II Sister   . Narcolepsy Paternal Uncle       VITAL SIGNS Pulse 78   Temp 98.5 F (36.9 C)   Resp 17   Ht 5' 10.5" (1.791 m)   Wt  218 lb (98.9 kg)   SpO2 98%   BMI 30.84 kg/m   Patient's Medications  New Prescriptions   No medications on file  Previous Medications   ATORVASTATIN (LIPITOR) 20 MG TABLET    Take 1 tablet (20 mg total) by mouth daily at 6 PM.   FERROUS SULFATE 325 (65 FE) MG EC TABLET    Take 1 tablet (325 mg total) by mouth 3 (three) times daily with meals.   FLUTICASONE (FLONASE) 50 MCG/ACT NASAL SPRAY    Place 2 sprays into both nostrils daily.   FUROSEMIDE (LASIX) 40 MG TABLET    Take 3 tablets (120 mg total) by mouth 2 (two) times daily.   HYDRALAZINE (APRESOLINE) 25 MG TABLET    Take 3 tablets (75 mg total) by mouth every 8 (eight) hours.   HYDROCODONE-ACETAMINOPHEN (NORCO/VICODIN) 5-325 MG TABLET    Take 1 tablet by mouth every 6 (six) hours as needed for moderate pain.    INSULIN ASPART (NOVOLOG) 100 UNIT/ML INJECTION    Inject as per sliding scale:  If 0 - 150 = 0 units, 151+ = 5 units.  Call for CBG > 400   PANTOPRAZOLE (PROTONIX) 40 MG TABLET    Take 1 tablet (40 mg total) by mouth daily at 6 (six) AM.   POLYETHYLENE GLYCOL (MIRALAX / GLYCOLAX) PACKET    Take 17 g by mouth daily.   SENNA (SENOKOT) 8.6 MG TABS TABLET    Take 2 tablets by mouth at bedtime.  Modified Medications   No medications on file  Discontinued Medications   INSULIN ASPART (NOVOLOG) 100 UNIT/ML INJECTION    Inject 0-10 Units into the skin See admin instructions. Inject 0-10 units SQ 3 times daily before meals per sliding scale. 0-69 = begin hypoglycemic protocol, 70-149 = 0 units, 150-200 = 5 units, 201-300 = 8 units, 301-400 = 10 units, > 400 = give 10 units and call MD/NP     SIGNIFICANT DIAGNOSTIC EXAMS  09-10-16: ct of chest abdomen and pelvis: 1. Moderate pericardial effusion not noted on recent echocardiography. Neighboring fat is reticulated and pericarditis should be considered. 2. Volume overload with marked anasarca, moderate bilateral pleural effusion, and small ascites. 3. Multi segment atelectasis.  09-11-16: 2-d echo: - Left ventricle: The cavity size was normal. Systolic function was normal. The estimated ejection fraction was in the range of 60% to 65%. Although no diagnostic regional wall motion abnormality was identified, this possibility cannot be completely excluded on the basis of this study. - Right ventricle: The cavity size was normal. Systolic function was normal. - Systemic veins: IVC measured 2.5 cm with < 50% respirophasic variation, suggesting RA pressure 15 mmHg. - Pericardium, extracardiac: Small circumferential pericardial  effusion. I do not think there is tamponade present. The effusion is small, there is no RV diastolic collapse. The IVC is dilated but this may be due to volume overload.   LABS REVIEWED:   09-06-16: hgb a1c 6.4; tsh 1.175  11-27-16: wb 3.9;  hgb 5.9; hct 19.6; mcv 73.1 plt 101; glucose 145; bun 55; creat 3.12 ;k+ 3.9; na++ 140; ast 8 alt 8 albumin 3.6; BNP 361.4; FOBT: positive 11-28-16: wbc 3.6; hgb 7.4; hct 24.0; mcv 74.8; plt 91; glucose 101; bun 52; creat 2.89; k+ 3.8; na++ 140; vit B 12: 582; iron 21; tibc 336; ferritin 9; folate 13.9 11-29-16: wbc 4.0; hgb 8.0; hct 25.8; mcv 75.0; plt 92; glucose 105; bun 53; creat 2.98; k+ 3.8; na++ 142    Review  of Systems  Unable to perform ROS: Other (did not answer questions )    Physical Exam  Constitutional: No distress.  Overweight   Eyes: Conjunctivae are normal.  Neck: Neck supple. No JVD present. No thyromegaly present.  Cardiovascular: Normal rate, regular rhythm, normal heart sounds and intact distal pulses.   Pedal pulses very faint   Respiratory: Effort normal and breath sounds normal. No respiratory distress. He has no wheezes.  GI: Soft. Bowel sounds are normal. He exhibits no distension. There is no tenderness.  Musculoskeletal: He exhibits edema.  Able to move all extremities  3+ bilateral lower extremity edema   Lymphadenopathy:    He has no cervical adenopathy.  Neurological: He is alert.  Skin: Skin is warm and dry. He is not diaphoretic.  Extremely dry skin to bilateral lower extremities  Skin is discolored to poor circulation No open lesions present   Psychiatric: He has a normal mood and affect.       ASSESSMENT/ PLAN: HE WILL DECLINE MEDICATIONS AT TIMES  1. Chronic diastolic heart failure: EF 60-65% (09-11-16):  Will continue lasix 120 mg twice daily apresoline 75 mg three times daily will continue to monitor   2. Stage IV chronic kidney disease: bun 53; creat 2.98; will monitor   3.  Diabetes: hgb a1c 6.4; will continue novolog 5 units with meals for cbg >150  4. Dyslipidemia: will continue lipitor 20 mg daily   5. Anemia: likely iron deficiency from chronic blood loss: hgb 8.0; will continue iron three times daily   6. Gerd: will continue  protonix 40 mg daily   7. Constipation: will continue miralax daily and senna 2 tabs daily   8. Allergic rhinitis: will continue flonase daily   9. Atrial flutter: heart rate is stable; eliquis was stopped pending GI for endoscopy and colonoscopy   10. Pancytopenia: his spep is pending will continue to monitor  Will setup GI consult for the next 1-2 weeks for endoscopy and colonoscopy Will setup nephrology consult for stage IV renal failure and anemia for possible epo injections Will setup a palliative care consult   Time spent with patient  50   minutes >50% time spent counseling; reviewing medical record; tests; labs; and developing future plan of care   MD is aware of resident's narcotic use and is in agreement with current plan of care. We will attempt to wean resident as apropriate   Ok Edwards NP [facility]  Contact [phone] Monday through Friday 8am- 5pm  After hours call [phone]

## 2016-12-04 ENCOUNTER — Non-Acute Institutional Stay (SKILLED_NURSING_FACILITY): Payer: Medicare HMO | Admitting: Internal Medicine

## 2016-12-04 ENCOUNTER — Encounter: Payer: Self-pay | Admitting: Internal Medicine

## 2016-12-04 DIAGNOSIS — R454 Irritability and anger: Secondary | ICD-10-CM

## 2016-12-04 DIAGNOSIS — I10 Essential (primary) hypertension: Secondary | ICD-10-CM

## 2016-12-04 DIAGNOSIS — K922 Gastrointestinal hemorrhage, unspecified: Secondary | ICD-10-CM

## 2016-12-04 DIAGNOSIS — Z794 Long term (current) use of insulin: Secondary | ICD-10-CM

## 2016-12-04 DIAGNOSIS — E1165 Type 2 diabetes mellitus with hyperglycemia: Secondary | ICD-10-CM

## 2016-12-04 DIAGNOSIS — R4589 Other symptoms and signs involving emotional state: Secondary | ICD-10-CM

## 2016-12-04 DIAGNOSIS — I483 Typical atrial flutter: Secondary | ICD-10-CM

## 2016-12-04 DIAGNOSIS — E1121 Type 2 diabetes mellitus with diabetic nephropathy: Secondary | ICD-10-CM | POA: Diagnosis not present

## 2016-12-04 DIAGNOSIS — IMO0002 Reserved for concepts with insufficient information to code with codable children: Secondary | ICD-10-CM

## 2016-12-04 DIAGNOSIS — I5032 Chronic diastolic (congestive) heart failure: Secondary | ICD-10-CM

## 2016-12-04 NOTE — Progress Notes (Signed)
This is a comprehensive admission note to [facility] performed on this date less than 30 days from date of admission. Included are preadmission medical/surgical history;reconciled medication list; family history; social history and comprehensive review of systems.  Corrections and additions to the records were documented . Comprehensive physical exam was also performed. Additionally a clinical summary was entered for each active diagnosis pertinent to this admission in the Problem List to enhance continuity of care.  PCP: [name] PA  HPI: The patient was hospitalized 4/4-11/29/16 from [facility] for evaluation of anemia was associated with progressive weakness at the facility despite receiving PT/OT. He was found to have a hemoglobin of 5.6 with positive occult stools. There had been no reported melena, rectal bleeding or hematochezia. He does have history of colon cancer for which he had a right colectomy ; he had no post colectomy surveillance. Eliquis was discontinued and he was transfused 2 units. Ferritin was 9, he received Feraheme & oral iron supplement. Gastroneurology recommended EGD and colonoscopy, but the patient declined. Subsequently he acquiesced, these were to be set up as an outpatient.  In addition to anemia he actually had pancytopenia with white count 3900 and platelet count 101,000 11/29/16 BUN 53, creatinine 2.98, and GFR 21. Significantly the patient had been receiving refusing glucose monitoring and medications while hospitalized. The patient had been evaluated at the prior SNF for depression and anger issues. Unna boots were recommended at the SNF to treat the chronic leg edema.  Past medical and surgical history: Includes insulin-dependent diabetes mellitus with neuropathy, essential hypertension depression, coronary artery disease, chronic diastolic heart failure, and atrial flutter/fibrillation (CHADS-VASc 5 based on age, congestive heart  failure, hypertension, diabetes). He had been on furosemide 120 mg twice a day for congestive heart failure but the patient refused doses intermittently. A1c was 6.4% in January of this year. He had been receiving NovoLog 5-10 units 3 times a day  Social history: Former smoker, nondrinker  Family history: Reviewed  Review of systems:Could not be completed due to his mental status. 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 Actually time to response was routinely over 20 seconds. The responses were appropriate and he knew the date. He listed the president as "Tramp". Staff reports he does intermittently complain of pain in his legs but state for the most part he is not communicative verbally.  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 Apparently he filed a [employer] the company and the union.  Physical exam:  Pertinent or positive findings: the most striking finding is his affect. He has pattern alopecia. Marked ptosis is present, left greater than right. He has slight resting exotropia on the right. He has a beard and mustache. He has an upper plate, the mandible is edentulous. Chest surprisingly clear with only minimal rales. Rhythm is markedly slow and irregular. He has 2+ edema despite wearing wraps. With palpation of the feet ; he expressed responses of tenderness. Pedal pulses are decreased.  General appearance:Adequately nourished; no acute distress , increased work of breathing is present.   Lymphatic: No lymphadenopathy about the head, neck, axilla . Eyes: No conjunctival inflammation or lid edema is present. There is no scleral icterus. Ears:  External ear exam shows no significant lesions or deformities.   Nose:  External nasal examination shows no deformity or inflammation. Nasal mucosa  are pink and moist without lesions ,exudates Oral exam: lips and gums  are healthy appearing.There is no oropharyngeal erythema or exudate . Neck:  No thyromegaly, masses, tenderness noted.    Heart:  No gallop, murmur, click, rub .  Lungs: without wheezes, rhonchi, rubs. Abdomen:Bowel sounds are normal. Abdomen is soft and nontender with no organomegaly, hernias,masses. GU: deferred  Extremities:  No cyanosis, clubbing  Neurologic exam : Balance,Rhomberg,finger to nose testing could not be completed due to clinical state Deep tendon reflexes are equal Skin: Warm & dry w/o tenting. No significant rash.  See clinical summary under each active problem in the Problem List with associated updated therapeutic plan

## 2016-12-04 NOTE — Assessment & Plan Note (Signed)
GI referral, Dr. Benson Norway

## 2016-12-04 NOTE — Assessment & Plan Note (Signed)
Continue high-dose furosemide twice a day as patient allows.

## 2016-12-04 NOTE — Assessment & Plan Note (Signed)
Assess response to SSRI Consider neuropsychiatric consultation

## 2016-12-04 NOTE — Patient Instructions (Signed)
See assessment and plan under each diagnosis in the problem list and acutely for this visit 

## 2016-12-04 NOTE — Assessment & Plan Note (Signed)
Trial of SSRI with agitation as clinically indicated based on response

## 2016-12-04 NOTE — Assessment & Plan Note (Signed)
A1c was 6.4% in January 2018 Basal insulin will be prescribed and sliding scale discontinued because of the risk of hypoglycemia mimicing TIA & potentially worsening his mental status picture

## 2016-12-04 NOTE — Assessment & Plan Note (Signed)
BP uncontrolled; add Clonidine antihypertensive  HCTZ precluded because of chronic kidney disease, beta blocker or rate limiting calcium channel blocker limited due to rate, vasodilating calcium channel blocker precluded due to profound edema

## 2016-12-04 NOTE — Assessment & Plan Note (Addendum)
Most recent EKG reviewed and atrial flutter confirmed. Atrial rate 241, ventricular rate 56. Eliquis being held pending GI evaluation

## 2016-12-05 LAB — HEPATIC FUNCTION PANEL
ALT: 5 U/L — AB (ref 10–40)
AST: 6 U/L — AB (ref 14–40)
Alkaline Phosphatase: 52 U/L (ref 25–125)
Bilirubin, Total: 0.4 mg/dL

## 2016-12-05 LAB — CBC AND DIFFERENTIAL
HEMATOCRIT: 25 % — AB (ref 41–53)
HEMOGLOBIN: 7.8 g/dL — AB (ref 13.5–17.5)
Neutrophils Absolute: 1 /uL
PLATELETS: 71 10*3/uL — AB (ref 150–399)
WBC: 3.4 10*3/mL

## 2016-12-05 LAB — BASIC METABOLIC PANEL
BUN: 49 mg/dL — AB (ref 4–21)
Creatinine: 3.2 mg/dL — AB (ref 0.6–1.3)
Glucose: 62 mg/dL
Potassium: 4.1 mmol/L (ref 3.4–5.3)
Sodium: 141 mmol/L (ref 137–147)

## 2016-12-05 LAB — HEMOGLOBIN A1C: Hemoglobin A1C: 6.1

## 2016-12-06 ENCOUNTER — Ambulatory Visit: Payer: Medicare HMO | Admitting: Cardiology

## 2016-12-06 ENCOUNTER — Encounter: Payer: Self-pay | Admitting: *Deleted

## 2016-12-26 ENCOUNTER — Institutional Professional Consult (permissible substitution): Payer: Self-pay | Admitting: Pulmonary Disease

## 2017-01-02 ENCOUNTER — Encounter: Payer: Self-pay | Admitting: Adult Health

## 2017-01-02 ENCOUNTER — Non-Acute Institutional Stay (SKILLED_NURSING_FACILITY): Payer: Medicare HMO | Admitting: Adult Health

## 2017-01-02 DIAGNOSIS — E1121 Type 2 diabetes mellitus with diabetic nephropathy: Secondary | ICD-10-CM | POA: Diagnosis not present

## 2017-01-02 DIAGNOSIS — I639 Cerebral infarction, unspecified: Secondary | ICD-10-CM

## 2017-01-02 DIAGNOSIS — N184 Chronic kidney disease, stage 4 (severe): Secondary | ICD-10-CM | POA: Diagnosis not present

## 2017-01-02 DIAGNOSIS — I483 Typical atrial flutter: Secondary | ICD-10-CM | POA: Diagnosis not present

## 2017-01-02 DIAGNOSIS — D61818 Other pancytopenia: Secondary | ICD-10-CM | POA: Diagnosis not present

## 2017-01-02 DIAGNOSIS — E1165 Type 2 diabetes mellitus with hyperglycemia: Secondary | ICD-10-CM

## 2017-01-02 DIAGNOSIS — I5032 Chronic diastolic (congestive) heart failure: Secondary | ICD-10-CM

## 2017-01-02 DIAGNOSIS — Z794 Long term (current) use of insulin: Secondary | ICD-10-CM

## 2017-01-02 DIAGNOSIS — IMO0002 Reserved for concepts with insufficient information to code with codable children: Secondary | ICD-10-CM

## 2017-01-02 NOTE — Progress Notes (Signed)
Location:   [place] Room Number: 141 A Place of Service:  SNF (31)   CODE STATUS: Full Code  Allergies  Allergen Reactions  . Metformin And Related Other (See Comments)    Reaction: pt states he thinks he threw up    Chief Complaint  Patient presents with  . Medical Management of Chronic Issues    1 month follow up    HPI:  He is a short term resident of this facility being seen for the management of his chronic illnesses. overall his status is without change. He has no complaints at this time. There are no nursing concerns at this time.    Past Medical History:  Diagnosis Date  . Atrial fibrillation ([place])   . Atrial flutter ([place])    hx/notes 11/28/2016  . CHF (congestive heart failure) ([place])   . Chronic diastolic CHF (congestive heart failure) ([place])    hx/notes 11/28/2016  . CKD (chronic kidney disease), stage IV ([place])    hx/notes 11/28/2016  . Colon cancer ([place])    hx/notes 11/28/2016  . Coronary artery disease   . Depression    hx/notes 11/28/2016  . Diabetic neuropathy ([place])    hx/notes 11/28/2016  . GI bleeding 11/27/2016   notes 11/28/2016  . History of blood transfusion 11/28/2016   hx/notes 11/28/2016  . Hypertension   . IDDM (insulin dependent diabetes mellitus) ([place])    uncontrolled; hx/notes 11/28/2016    Past Surgical History:  Procedure Laterality Date  . COLONOSCOPY  06/21/2012   Procedure: COLONOSCOPY;  Surgeon: Juanita Craver, MD;  Location: [facility] ENDOSCOPY;  Service: Endoscopy;  Laterality: N/A;  . COLONOSCOPY Left 02/13/2014   Procedure: COLONOSCOPY;  Surgeon: Juanita Craver, MD;  Location: [facility] ENDOSCOPY;  Service: Endoscopy;  Laterality: Left;  . ESOPHAGOGASTRODUODENOSCOPY  06/19/2012   Procedure: ESOPHAGOGASTRODUODENOSCOPY (EGD);  Surgeon: Beryle Beams, MD;  Location: [facility] ENDOSCOPY;  Service: Endoscopy;  Laterality: N/A;  . LAPAROSCOPIC RIGHT COLECTOMY Right 02/15/2014   Procedure: LAPAROSCOPIC ASISSTED RIGHT  COLECTOMY, POSSIBLE OPEN;  Surgeon:  Rolm Bookbinder, MD;  Location: [place];  Service: General;  Laterality: Right;  . LEG SURGERY     Left leg surgery. broken leg    Social History   Social History  . Marital status: Widowed    Spouse name: N/A  . Number of children: N/A  . Years of education: N/A   Occupational History  . Retired    Social History Main Topics  . Smoking status: Former Smoker    Quit date: 08/26/1998  . Smokeless tobacco: Never Used  . Alcohol use No     Comment: in the past  . Drug use: No  . Sexual activity: No   Other Topics Concern  . Not on file   Social History Narrative   Widower-   Has #2 children and #2 grandchildren and a great grandchild   Retired from [employer] (15 years ago)   Currently resides at [facility] and [place] with walker-   Family History  Problem Relation Age of Onset  . Brain cancer Mother   . Diabetes type II Sister   . Narcolepsy Paternal Uncle       VITAL SIGNS BP 135/64   Pulse (!) 58   Temp 97.2 F (36.2 C)   Resp 16   Ht 5' 10.5" (1.791 m)   Wt 213 lb 3.2 oz (96.7 kg)   SpO2 95%   BMI 30.16 kg/m   Patient's Medications  New Prescriptions   No medications on file  Previous Medications   ARIPIPRAZOLE (ABILIFY) 5 MG TABLET    Give 2.5 tablet by mouth one time a day   ATORVASTATIN (LIPITOR) 20 MG TABLET    Take 1 tablet (20 mg total) by mouth daily at 6 PM.   ELASTIC BANDAGES & SUPPORTS MISC    ACE Bandage self adhering - Apply to lower legs topically one time a d day for Thick Dry scaly skin   FERROUS SULFATE 325 (65 FE) MG EC TABLET    Take 1 tablet (325 mg total) by mouth 3 (three) times daily with meals.   FLUTICASONE (FLONASE) 50 MCG/ACT NASAL SPRAY    Place 2 sprays into both nostrils daily.   FUROSEMIDE (LASIX) 40 MG TABLET    Take 3 tablets (120 mg total) by mouth 2 (two) times daily.   HYDRALAZINE (APRESOLINE) 25 MG TABLET    Take 3 tablets (75 mg total) by mouth every 8 (eight) hours.   HYDROCODONE-ACETAMINOPHEN  (NORCO/VICODIN) 5-325 MG TABLET    Take 1 tablet by mouth every 6 (six) hours as needed for moderate pain.   INSULIN GLARGINE (LANTUS SOLOSTAR) 100 UNIT/ML SOLOSTAR PEN    Inject 15 Units into the skin daily at 10 pm.   PANTOPRAZOLE (PROTONIX) 40 MG TABLET    Take 1 tablet (40 mg total) by mouth daily at 6 (six) AM.   POLYETHYLENE GLYCOL (MIRALAX / GLYCOLAX) PACKET    Take 17 g by mouth daily.   SENNA (SENOKOT) 8.6 MG TABS TABLET    Take 2 tablets by mouth at bedtime.   TRIAMCINOLONE CREAM (KENALOG) 0.1 %    Apply 1 application topically daily at 6 PM.  Modified Medications   No medications on file  Discontinued Medications   CLONIDINE (CATAPRES) 0.1 MG TABLET    Take 1 tablet (0.1 mg total) by mouth daily.     SIGNIFICANT DIAGNOSTIC EXAMS  09-10-16: ct of chest abdomen and pelvis: 1. Moderate pericardial effusion not noted on recent echocardiography. Neighboring fat is reticulated and pericarditis should be considered. 2. Volume overload with marked anasarca, moderate bilateral pleural effusion, and small ascites. 3. Multi segment atelectasis.  09-11-16: 2-d echo: - Left ventricle: The cavity size was normal. Systolic function was normal. The estimated ejection fraction was in the range of 60% to 65%. Although no diagnostic regional wall motion abnormality was identified, this possibility cannot be completely excluded on the basis of this study. - Right ventricle: The cavity size was normal. Systolic function was normal. - Systemic veins: IVC measured 2.5 cm with < 50% respirophasic variation, suggesting RA pressure 15 mmHg. - Pericardium, extracardiac: Small circumferential pericardial  effusion. I do not think there is tamponade present. The effusion is small, there is no RV diastolic collapse. The IVC is dilated but this may be due to volume overload.   LABS REVIEWED:   09-06-16: hgb a1c 6.4; tsh 1.175  11-27-16: wb 3.9; hgb 5.9; hct 19.6; mcv 73.1 plt 101; glucose 145; bun 55; creat 3.12  ;k+ 3.9; na++ 140; ast 8 alt 8 albumin 3.6; BNP 361.4; FOBT: positive 11-28-16: wbc 3.6; hgb 7.4; hct 24.0; mcv 74.8; plt 91; glucose 101; bun 52; creat 2.89; k+ 3.8; na++ 140; vit B 12: 582; iron 21; tibc 336; ferritin 9; folate 13.9 11-29-16: wbc 4.0; hgb 8.0; hct 25.8; mcv 75.0; plt 92; glucose 105; bun 53; creat 2.98; k+ 3.8; na++ 142    Review of Systems  Unable to  perform ROS: Other (did not answer questions )    Physical Exam  Constitutional: No distress.  Overweight   Eyes: Conjunctivae are normal.  Neck: Neck supple. No JVD present. No thyromegaly present.  Cardiovascular: Normal rate, regular rhythm, normal heart sounds and intact distal pulses.   Pedal pulses very faint   Respiratory: Effort normal and breath sounds normal. No respiratory distress. He has no wheezes.  GI: Soft. Bowel sounds are normal. He exhibits no distension. There is no tenderness.  Musculoskeletal: He exhibits edema.  Able to move all extremities  3+ bilateral lower extremity edema   Lymphadenopathy:    He has no cervical adenopathy.  Neurological: He is alert.  Skin: Skin is warm and dry. He is not diaphoretic.  Extremely dry skin to bilateral lower extremities  Skin is discolored to poor circulation No open lesions present   Psychiatric: He has a normal mood and affect.       ASSESSMENT/ PLAN: HE WILL DECLINE MEDICATIONS AT TIMES  1. Chronic diastolic heart failure: EF 60-65% (09-11-16):  Will continue lasix 120 mg twice daily apresoline 75 mg three times daily will continue to monitor   2. Stage IV chronic kidney disease: bun 53; creat 2.98; will monitor   3.  Diabetes: hgb a1c 6.4; will continue novolog 5 units with meals for cbg >150  4. Dyslipidemia: will continue lipitor 20 mg daily   5. Anemia: likely iron deficiency from chronic blood loss: hgb 8.0; will continue iron three times daily   6. Gerd: will continue protonix 40 mg daily   7. Constipation: will continue miralax daily and  senna 2 tabs daily   8. Allergic rhinitis: will continue flonase daily   9. Atrial flutter: heart rate is stable; eliquis was stopped pending GI for endoscopy and colonoscopy   10. Pancytopenia: hematology consult pending.    MD is aware of resident's narcotic use and is in agreement with current plan of care. We will attempt to wean resident as apropriate    Ok Edwards NP [facility]  Contact [phone] Monday through Friday 8am- 5pm  After hours call [phone]

## 2017-01-30 ENCOUNTER — Encounter: Payer: Self-pay | Admitting: Adult Health

## 2017-01-30 ENCOUNTER — Non-Acute Institutional Stay (SKILLED_NURSING_FACILITY): Payer: Medicare HMO | Admitting: Adult Health

## 2017-01-30 DIAGNOSIS — N184 Chronic kidney disease, stage 4 (severe): Secondary | ICD-10-CM | POA: Diagnosis not present

## 2017-01-30 DIAGNOSIS — E1165 Type 2 diabetes mellitus with hyperglycemia: Secondary | ICD-10-CM

## 2017-01-30 DIAGNOSIS — I5032 Chronic diastolic (congestive) heart failure: Secondary | ICD-10-CM | POA: Diagnosis not present

## 2017-01-30 DIAGNOSIS — I10 Essential (primary) hypertension: Secondary | ICD-10-CM | POA: Diagnosis not present

## 2017-01-30 DIAGNOSIS — Z794 Long term (current) use of insulin: Secondary | ICD-10-CM

## 2017-01-30 DIAGNOSIS — D61818 Other pancytopenia: Secondary | ICD-10-CM

## 2017-01-30 DIAGNOSIS — I483 Typical atrial flutter: Secondary | ICD-10-CM | POA: Diagnosis not present

## 2017-01-30 DIAGNOSIS — E1121 Type 2 diabetes mellitus with diabetic nephropathy: Secondary | ICD-10-CM | POA: Diagnosis not present

## 2017-01-30 DIAGNOSIS — IMO0002 Reserved for concepts with insufficient information to code with codable children: Secondary | ICD-10-CM

## 2017-01-30 NOTE — Progress Notes (Signed)
Location:   [place] Room Number: 126 B Place of Service:  SNF (31)   CODE STATUS: Full Code  Allergies  Allergen Reactions  . Metformin And Related Other (See Comments)    Reaction: pt states he thinks he threw up    Chief Complaint  Patient presents with  . Medical Management of Chronic Issues    1 month follow up    HPI:  He is a long term resident of this facility being seen for the management of his chronic illnesses. Overall there is little change in his status. He is not voicing any complaints at this time. There are no nursing concerns at this time.    Past Medical History:  Diagnosis Date  . Atrial fibrillation ([place])   . Atrial flutter ([place])    hx/notes 11/28/2016  . CHF (congestive heart failure) ([place])   . Chronic diastolic CHF (congestive heart failure) ([place])    hx/notes 11/28/2016  . CKD (chronic kidney disease), stage IV ([place])    hx/notes 11/28/2016  . Colon cancer ([place])    hx/notes 11/28/2016  . Coronary artery disease   . Depression    hx/notes 11/28/2016  . Diabetic neuropathy ([place])    hx/notes 11/28/2016  . GI bleeding 11/27/2016   notes 11/28/2016  . History of blood transfusion 11/28/2016   hx/notes 11/28/2016  . Hypertension   . IDDM (insulin dependent diabetes mellitus) ([place])    uncontrolled; hx/notes 11/28/2016    Past Surgical History:  Procedure Laterality Date  . COLONOSCOPY  06/21/2012   Procedure: COLONOSCOPY;  Surgeon: Juanita Craver, MD;  Location: [facility] ENDOSCOPY;  Service: Endoscopy;  Laterality: N/A;  . COLONOSCOPY Left 02/13/2014   Procedure: COLONOSCOPY;  Surgeon: Juanita Craver, MD;  Location: [facility] ENDOSCOPY;  Service: Endoscopy;  Laterality: Left;  . ESOPHAGOGASTRODUODENOSCOPY  06/19/2012   Procedure: ESOPHAGOGASTRODUODENOSCOPY (EGD);  Surgeon: Beryle Beams, MD;  Location: [facility] ENDOSCOPY;  Service: Endoscopy;  Laterality: N/A;  . LAPAROSCOPIC RIGHT COLECTOMY Right 02/15/2014   Procedure: LAPAROSCOPIC ASISSTED RIGHT  COLECTOMY, POSSIBLE  OPEN;  Surgeon: Rolm Bookbinder, MD;  Location: [employer];  Service: General;  Laterality: Right;  . LEG SURGERY     Left leg surgery. broken leg    Social History   Social History  . Marital status: Widowed    Spouse name: N/A  . Number of children: N/A  . Years of education: N/A   Occupational History  . Retired    Social History Main Topics  . Smoking status: Former Smoker    Quit date: 08/26/1998  . Smokeless tobacco: Never Used  . Alcohol use No     Comment: in the past  . Drug use: No  . Sexual activity: No   Other Topics Concern  . Not on file   Social History Narrative   Widower-   Has #2 children and #2 grandchildren and a great grandchild   Retired from [employer] (15 years ago)   Currently resides at [facility] and [place] with walker-   Family History  Problem Relation Age of Onset  . Brain cancer Mother   . Diabetes type II Sister   . Narcolepsy Paternal Uncle       VITAL SIGNS BP (!) 144/86   Pulse 64   Temp 98.7 F (37.1 C)   Resp 18   Ht 6' (1.829 m)   Wt 218 lb (98.9 kg)   SpO2 95%   BMI 29.57 kg/m   Patient's Medications  New Prescriptions   No medications on file  Previous Medications   ARIPIPRAZOLE (ABILIFY) 5 MG TABLET    Give 2.5 tablet by mouth one time a day   ATORVASTATIN (LIPITOR) 20 MG TABLET    Take 1 tablet (20 mg total) by mouth daily at 6 PM.   FERROUS SULFATE 325 (65 FE) MG EC TABLET    Take 1 tablet (325 mg total) by mouth 3 (three) times daily with meals.   FLUTICASONE (FLONASE) 50 MCG/ACT NASAL SPRAY    Place 2 sprays into both nostrils daily.   FUROSEMIDE (LASIX) 40 MG TABLET    Take 3 tablets (120 mg total) by mouth 2 (two) times daily.   HYDRALAZINE (APRESOLINE) 25 MG TABLET    Take 3 tablets (75 mg total) by mouth every 8 (eight) hours.   HYDROCODONE-ACETAMINOPHEN (NORCO/VICODIN) 5-325 MG TABLET    Take 1 tablet by mouth every 6 (six) hours as needed for moderate pain.   INSULIN GLARGINE (LANTUS  SOLOSTAR) 100 UNIT/ML SOLOSTAR PEN    Inject 15 Units into the skin daily at 10 pm.   PANTOPRAZOLE (PROTONIX) 40 MG TABLET    Take 1 tablet (40 mg total) by mouth daily at 6 (six) AM.   POLYETHYLENE GLYCOL (MIRALAX / GLYCOLAX) PACKET    Take 17 g by mouth daily.   SENNA (SENOKOT) 8.6 MG TABS TABLET    Take 2 tablets by mouth at bedtime.  Modified Medications   No medications on file  Discontinued Medications   ELASTIC BANDAGES & SUPPORTS MISC    ACE Bandage self adhering - Apply to lower legs topically one time a d day for Thick Dry scaly skin   TRIAMCINOLONE CREAM (KENALOG) 0.1 %    Apply 1 application topically daily at 6 PM.     SIGNIFICANT DIAGNOSTIC EXAMS   09-10-16: ct of chest abdomen and pelvis: 1. Moderate pericardial effusion not noted on recent echocardiography. Neighboring fat is reticulated and pericarditis should be considered. 2. Volume overload with marked anasarca, moderate bilateral pleural effusion, and small ascites. 3. Multi segment atelectasis.  09-11-16: 2-d echo: - Left ventricle: The cavity size was normal. Systolic function was normal. The estimated ejection fraction was in the range of 60% to 65%. Although no diagnostic regional wall motion abnormality was identified, this possibility cannot be completely excluded on the basis of this study. - Right ventricle: The cavity size was normal. Systolic function was normal. - Systemic veins: IVC measured 2.5 cm with < 50% respirophasic variation, suggesting RA pressure 15 mmHg. - Pericardium, extracardiac: Small circumferential pericardial  effusion. I do not think there is tamponade present. The effusion is small, there is no RV diastolic collapse. The IVC is dilated but this may be due to volume overload.   LABS REVIEWED:   09-06-16: hgb a1c 6.4; tsh 1.175  11-27-16: wb 3.9; hgb 5.9; hct 19.6; mcv 73.1 plt 101; glucose 145; bun 55; creat 3.12 ;k+ 3.9; na++ 140; ast 8 alt 8 albumin 3.6; BNP 361.4; FOBT: positive 11-28-16:  wbc 3.6; hgb 7.4; hct 24.0; mcv 74.8; plt 91; glucose 101; bun 52; creat 2.89; k+ 3.8; na++ 140; vit B 12: 582; iron 21; tibc 336; ferritin 9; folate 13.9 11-29-16: wbc 4.0; hgb 8.0; hct 25.8; mcv 75.0; plt 92; glucose 105; bun 53; creat 2.98; k+ 3.8; na++ 142  12-05-16: wbc 3.4; hgb 7.8; hct 25.4; mcv 77.3; plt 71; glucose 62; bun 48.6; creat 3.19; k+ 4.1; na++ 141; ca 8.8; liver normal albumin  4.1 hgb a1c 6.1   Review of Systems  Unable to perform ROS: Other (did not answer questions )    Physical Exam  Constitutional: No distress.  Overweight   Eyes: Conjunctivae are normal.  Neck: Neck supple. No JVD present. No thyromegaly present.  Cardiovascular: Normal rate, regular rhythm, normal heart sounds and intact distal pulses.   Pedal pulses very faint   Respiratory: Effort normal and breath sounds normal. No respiratory distress. He has no wheezes.  GI: Soft. Bowel sounds are normal. He exhibits no distension. There is no tenderness.  Musculoskeletal: He exhibits edema.  Able to move all extremities  2+ bilateral lower extremity edema   Lymphadenopathy:    He has no cervical adenopathy.  Neurological: He is alert.  Skin: Skin is warm and dry. He is not diaphoretic.  Skin is discolored to poor circulation  Psychiatric: He has a normal mood and affect.       ASSESSMENT/ PLAN: HE WILL DECLINE MEDICATIONS AT TIMES  1. Chronic diastolic heart failure: EF 60-65% (09-11-16):  Will continue lasix 120 mg twice daily apresoline 75 mg three times daily will continue to monitor   2. Stage IV chronic kidney disease: bun 53; creat 2.98; will monitor   3.  Diabetes: hgb a1c 61. (prevoius 6.4); will continue lantus 15 units nightly   4. Dyslipidemia:  will continue lipitor 20 mg daily   5. Anemia: likely iron deficiency from chronic blood loss: hgb 8.0; will continue iron three times daily   6. Gerd: will continue protonix 40 mg daily   7. Constipation: will continue miralax daily and senna  2 tabs daily   8. Allergic rhinitis: will continue flonase daily   9. Atrial flutter: heart rate is stable; eliquis was stopped pending GI for endoscopy and colonoscopy   10. Pancytopenia: hematology consult pending.     Will check  tsh lipids      MD is aware of resident's narcotic use and is in agreement with current plan of care. We will attempt to wean resident as apropriate   Ok Edwards NP [facility]  Contact [phone] Monday through Friday 8am- 5pm  After hours call [phone]

## 2017-01-31 LAB — LIPID PANEL
CHOLESTEROL: 104 (ref 0–200)
HDL: 40 (ref 35–70)
LDL CALC: 51
TRIGLYCERIDES: 64 (ref 40–160)

## 2017-01-31 LAB — TSH: TSH: 2.79 (ref 0.41–5.90)

## 2017-02-03 ENCOUNTER — Encounter: Payer: Self-pay | Admitting: Adult Health

## 2017-02-03 ENCOUNTER — Non-Acute Institutional Stay (SKILLED_NURSING_FACILITY): Payer: Medicare HMO | Admitting: Adult Health

## 2017-02-03 DIAGNOSIS — I5032 Chronic diastolic (congestive) heart failure: Secondary | ICD-10-CM

## 2017-02-03 NOTE — Progress Notes (Signed)
Location:   [facility] Room Number: 126 B Place of Service:  SNF (31)   CODE STATUS: Full Code  Allergies  Allergen Reactions  . Metformin And Related Other (See Comments)    Reaction: pt states he thinks he threw up    Chief Complaint  Patient presents with  . Acute Visit    Edema    HPI:  Staff report that his lower extremity edema is getting worse. He does not like to answer questions; but did tell me that he feels "ok". His blood pressure today is elevated.  There are no reports in changes in his appetite. Staff tells me that he has not voiced any chest pain or shortness of breath    Past Medical History:  Diagnosis Date  . Atrial fibrillation ([place])   . Atrial flutter ([place])    hx/notes 11/28/2016  . CHF (congestive heart failure) ([place])   . Chronic diastolic CHF (congestive heart failure) ([place])    hx/notes 11/28/2016  . CKD (chronic kidney disease), stage IV ([place])    hx/notes 11/28/2016  . Colon cancer ([place])    hx/notes 11/28/2016  . Coronary artery disease   . Depression    hx/notes 11/28/2016  . Diabetic neuropathy ([place])    hx/notes 11/28/2016  . GI bleeding 11/27/2016   notes 11/28/2016  . History of blood transfusion 11/28/2016   hx/notes 11/28/2016  . Hypertension   . IDDM (insulin dependent diabetes mellitus) ([place])    uncontrolled; hx/notes 11/28/2016    Past Surgical History:  Procedure Laterality Date  . COLONOSCOPY  06/21/2012   Procedure: COLONOSCOPY;  Surgeon: Juanita Craver, MD;  Location: [facility] ENDOSCOPY;  Service: Endoscopy;  Laterality: N/A;  . COLONOSCOPY Left 02/13/2014   Procedure: COLONOSCOPY;  Surgeon: Juanita Craver, MD;  Location: [facility] ENDOSCOPY;  Service: Endoscopy;  Laterality: Left;  . ESOPHAGOGASTRODUODENOSCOPY  06/19/2012   Procedure: ESOPHAGOGASTRODUODENOSCOPY (EGD);  Surgeon: Beryle Beams, MD;  Location: [facility] ENDOSCOPY;  Service: Endoscopy;  Laterality: N/A;  . LAPAROSCOPIC RIGHT COLECTOMY Right 02/15/2014   Procedure: LAPAROSCOPIC ASISSTED  RIGHT  COLECTOMY, POSSIBLE OPEN;  Surgeon: Rolm Bookbinder, MD;  Location: [facility];  Service: General;  Laterality: Right;  . LEG SURGERY     Left leg surgery. broken leg    Social History   Social History  . Marital status: Widowed    Spouse name: N/A  . Number of children: N/A  . Years of education: N/A   Occupational History  . Retired    Social History Main Topics  . Smoking status: Former Smoker    Quit date: 08/26/1998  . Smokeless tobacco: Never Used  . Alcohol use No     Comment: in the past  . Drug use: No  . Sexual activity: No   Other Topics Concern  . Not on file   Social History Narrative   Widower-   Has #2 children and #2 grandchildren and a great grandchild   Retired from [employer] (15 years ago)   Currently resides at [facility] and [place] with walker-   Family History  Problem Relation Age of Onset  . Brain cancer Mother   . Diabetes type II Sister   . Narcolepsy Paternal Uncle       VITAL SIGNS BP (!) 196/80   Pulse (!) 40   Temp 98.5 F (36.9 C)   Resp 20   Ht 6' (1.829 m)   Wt 219 lb 6.4 oz (99.5 kg)   SpO2  98%   BMI 29.76 kg/m   Patient's Medications  New Prescriptions   No medications on file  Previous Medications   ARIPIPRAZOLE (ABILIFY) 5 MG TABLET    Give 2.5 tablet by mouth one time a day   ATORVASTATIN (LIPITOR) 20 MG TABLET    Take 1 tablet (20 mg total) by mouth daily at 6 PM.   FERROUS SULFATE 325 (65 FE) MG EC TABLET    Take 1 tablet (325 mg total) by mouth 3 (three) times daily with meals.   FLUTICASONE (FLONASE) 50 MCG/ACT NASAL SPRAY    Place 2 sprays into both nostrils daily.   FUROSEMIDE (LASIX) 40 MG TABLET    Take 3 tablets (120 mg total) by mouth 2 (two) times daily.   HYDRALAZINE (APRESOLINE) 25 MG TABLET    Take 3 tablets (75 mg total) by mouth every 8 (eight) hours.   HYDROCODONE-ACETAMINOPHEN (NORCO/VICODIN) 5-325 MG TABLET    Take 1 tablet by mouth every 6 (six) hours as needed for moderate  pain.   INSULIN GLARGINE (LANTUS SOLOSTAR) 100 UNIT/ML SOLOSTAR PEN    Inject 15 Units into the skin daily at 10 pm.   PANTOPRAZOLE (PROTONIX) 40 MG TABLET    Take 1 tablet (40 mg total) by mouth daily at 6 (six) AM.   POLYETHYLENE GLYCOL (MIRALAX / GLYCOLAX) PACKET    Take 17 g by mouth daily.   SENNA (SENOKOT) 8.6 MG TABS TABLET    Take 2 tablets by mouth at bedtime.  Modified Medications   No medications on file  Discontinued Medications   No medications on file     SIGNIFICANT DIAGNOSTIC EXAMS  09-10-16: ct of chest abdomen and pelvis: 1. Moderate pericardial effusion not noted on recent echocardiography. Neighboring fat is reticulated and pericarditis should be considered. 2. Volume overload with marked anasarca, moderate bilateral pleural effusion, and small ascites. 3. Multi segment atelectasis.  09-11-16: 2-d echo: - Left ventricle: The cavity size was normal. Systolic function was normal. The estimated ejection fraction was in the range of 60% to 65%. Although no diagnostic regional wall motion abnormality was identified, this possibility cannot be completely excluded on the basis of this study. - Right ventricle: The cavity size was normal. Systolic function was normal. - Systemic veins: IVC measured 2.5 cm with < 50% respirophasic variation, suggesting RA pressure 15 mmHg. - Pericardium, extracardiac: Small circumferential pericardial  effusion. I do not think there is tamponade present. The effusion is small, there is no RV diastolic collapse. The IVC is dilated but this may be due to volume overload.  12-02-16: ABI: moderate to severe ischemic changes.    LABS REVIEWED:   09-06-16: hgb a1c 6.4; tsh 1.175  11-27-16: wb 3.9; hgb 5.9; hct 19.6; mcv 73.1 plt 101; glucose 145; bun 55; creat 3.12 ;k+ 3.9; na++ 140; ast 8 alt 8 albumin 3.6; BNP 361.4; FOBT: positive 11-28-16: wbc 3.6; hgb 7.4; hct 24.0; mcv 74.8; plt 91; glucose 101; bun 52; creat 2.89; k+ 3.8; na++ 140; vit B 12: 582; iron  21; tibc 336; ferritin 9; folate 13.9 11-29-16: wbc 4.0; hgb 8.0; hct 25.8; mcv 75.0; plt 92; glucose 105; bun 53; creat 2.98; k+ 3.8; na++ 142  12-05-16: wbc 3.4; hgb 7.8; hct 25.4; mcv 77.3; plt 71; glucose 62; bun 48.6; creat 3.19; k+ 4.1; na++ 141; ca 8.8; liver normal albumin 4.1 hgb a1c 6.1   Review of Systems  Unable to perform ROS: Other (did not answer questions )  Physical Exam  Constitutional: No distress.  Overweight   Eyes: Conjunctivae are normal.  Neck: Neck supple. No JVD present. No thyromegaly present.  Cardiovascular: Normal rate, regular rhythm, normal heart sounds and intact distal pulses.   Pedal pulses very faint   Respiratory: Effort normal and breath sounds normal. No respiratory distress. He has no wheezes.  GI: Soft. Bowel sounds are normal. He exhibits no distension. There is no tenderness.  Musculoskeletal: He exhibits edema.  Able to move all extremities  3-4+ bilateral lower extremity edema   Lymphadenopathy:    He has no cervical adenopathy.  Neurological: He is alert.  Skin: Skin is warm and dry. He is not diaphoretic.  Skin is discolored to poor circulation Has abrasion right outer calf.   Psychiatric: He has a normal mood and affect.       ASSESSMENT/ PLAN: HE WILL DECLINE MEDICATIONS AT TIMES  1. Chronic diastolic heart failure: EF 60-65% (09-11-16):  Will continue lasix 120 mg twice daily apresoline 75 mg three times daily will continue to monitor  He is not elevating his lower extremities; will not wear ted hose. Will apply ace wraps to his lower extremities daily; will check blood pressure every 8 hours for one week will then report.     MD is aware of resident's narcotic use and is in agreement with current plan of care. We will attempt to wean resident as apropriate     Ok Edwards NP [facility]  Contact [phone] Monday through Friday 8am- 5pm  After hours call [phone]

## 2017-03-23 IMAGING — US US RENAL
1 series · 14 of 22 positions shown · non-contrast
Comparison: 11/28/2014

CLINICAL DATA: Acute kidney injury, rising BUN and creatinine

EXAM:
RENAL / URINARY TRACT ULTRASOUND COMPLETE

[Series 1: us renal · 0.31mm/px · 14 of 22 slices shown]
[im 1/22]
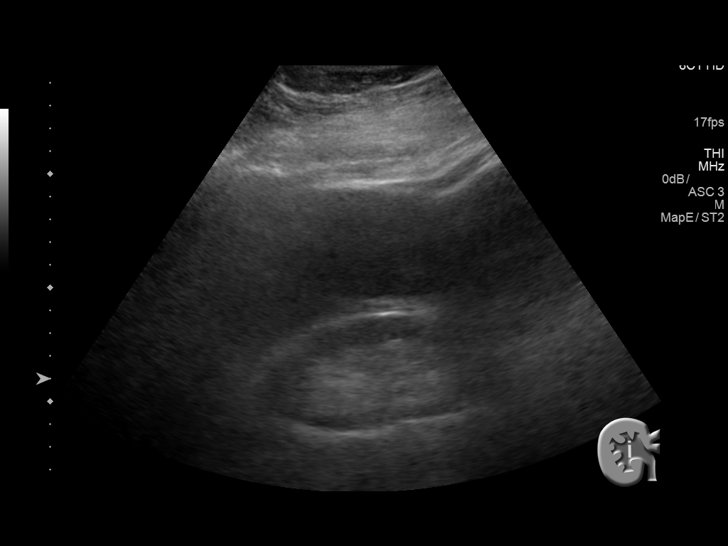
[im 3/22]
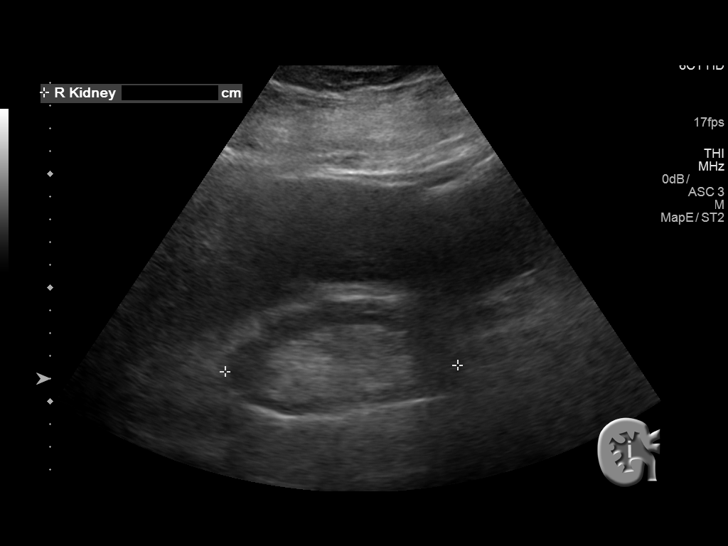
[im 4/22]
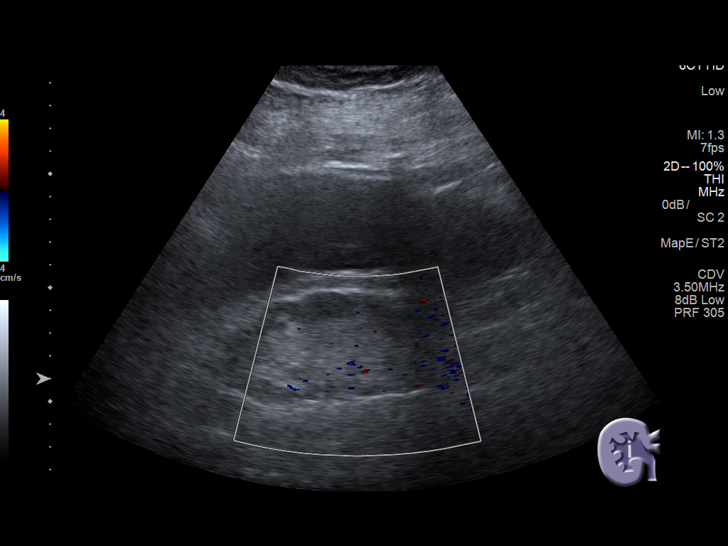
[im 6/22]
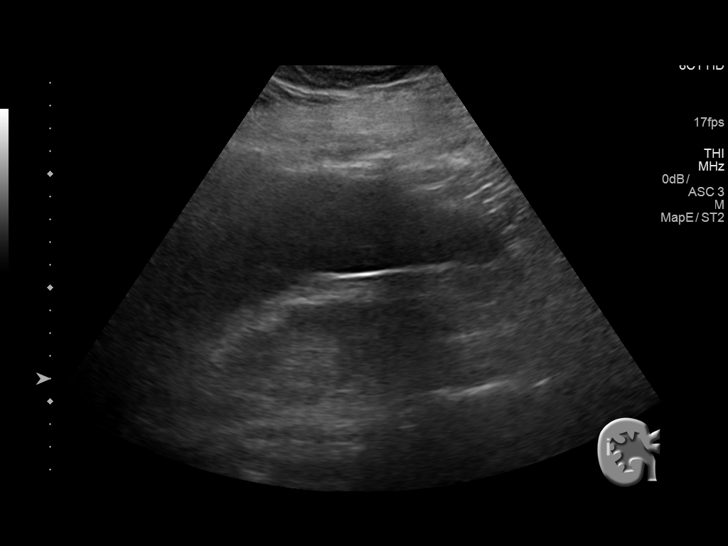
[im 8/22]
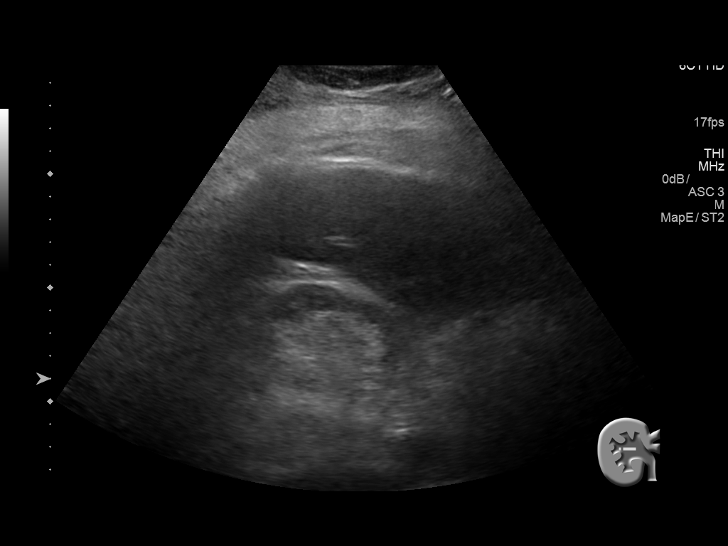
[im 9/22]
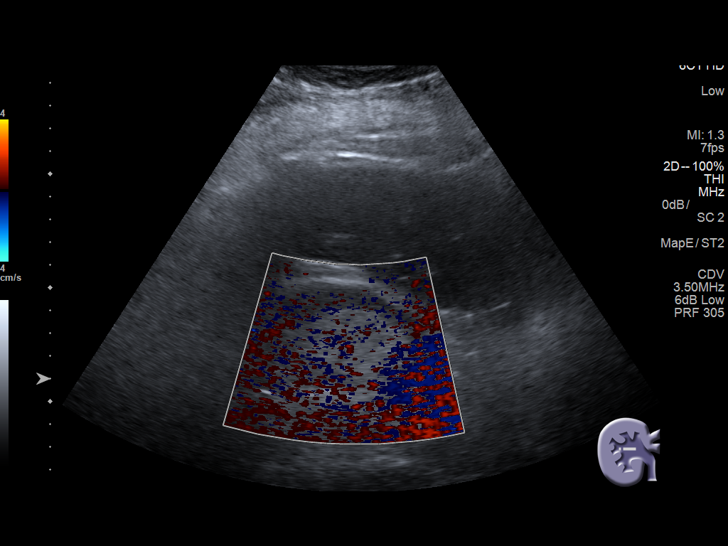
[im 11/22]
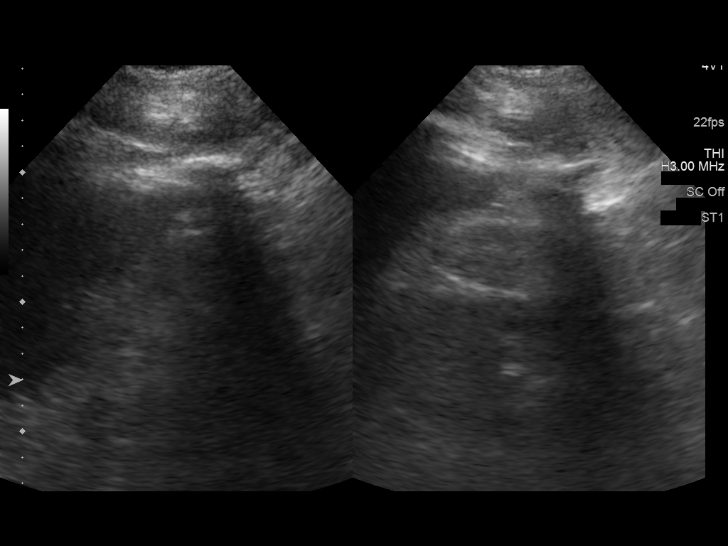
[im 12/22]
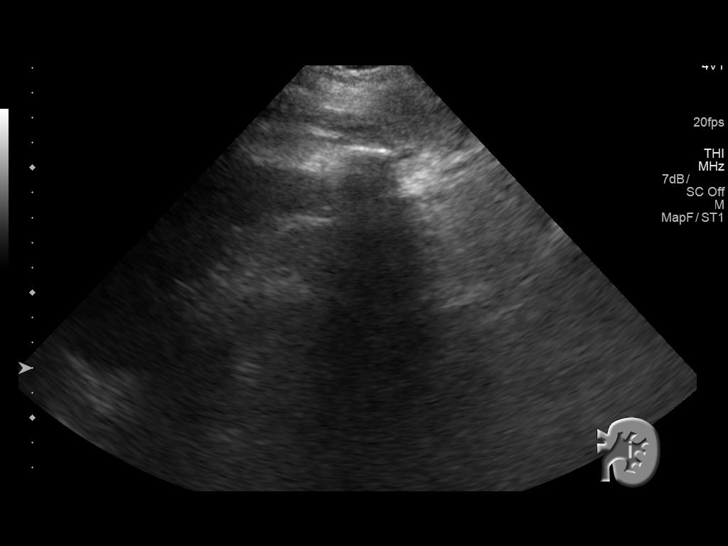
[im 14/22]
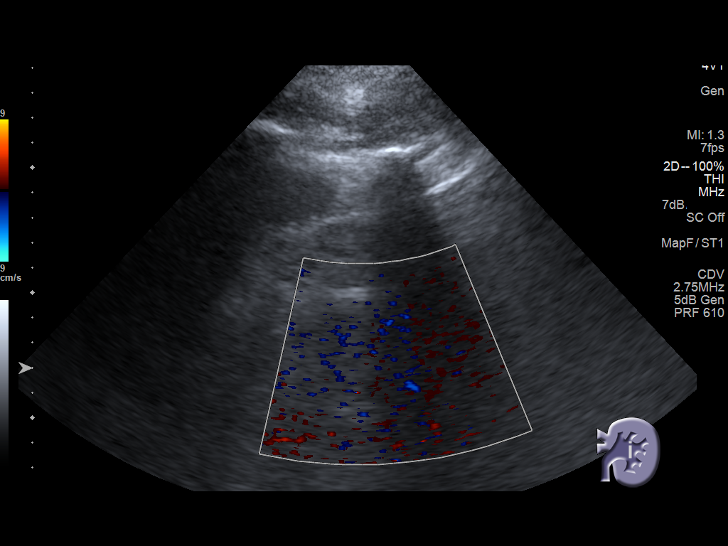
[im 15/22]
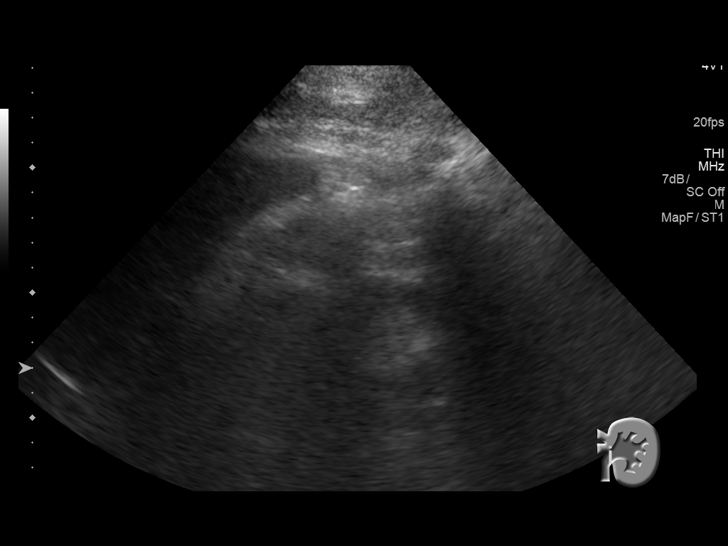
[im 17/22]
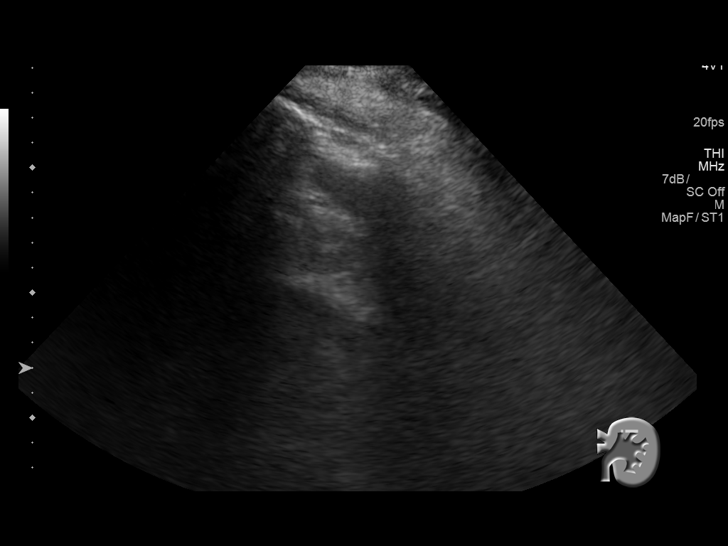
[im 19/22]
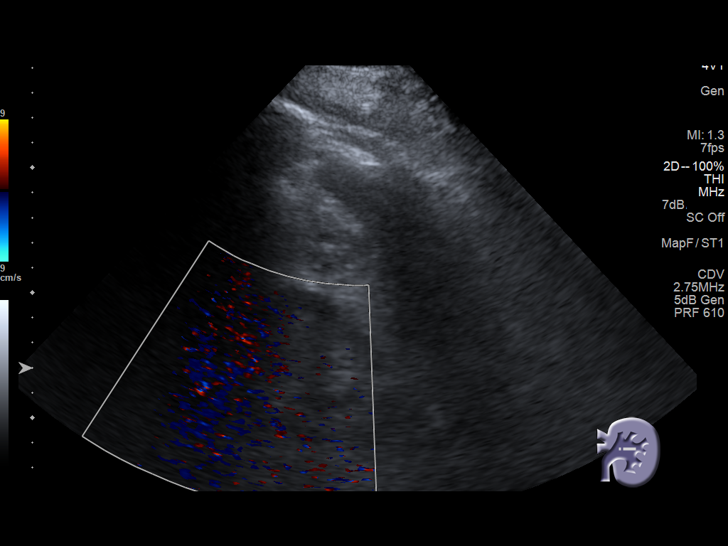
[im 20/22]
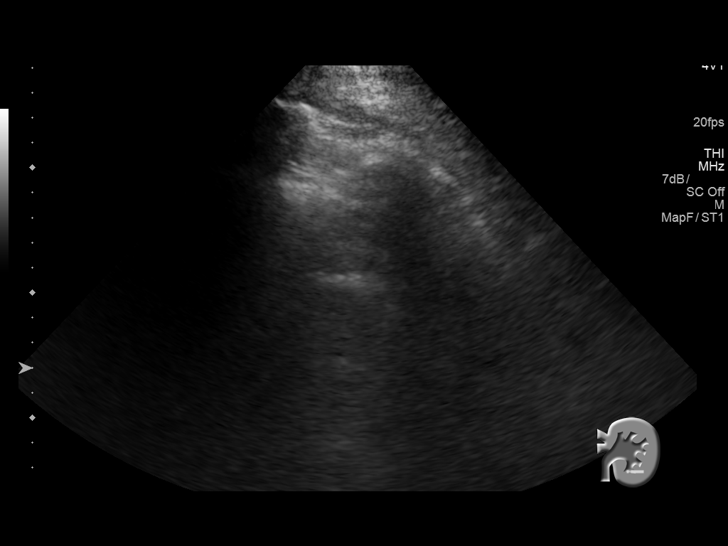
[im 22/22]
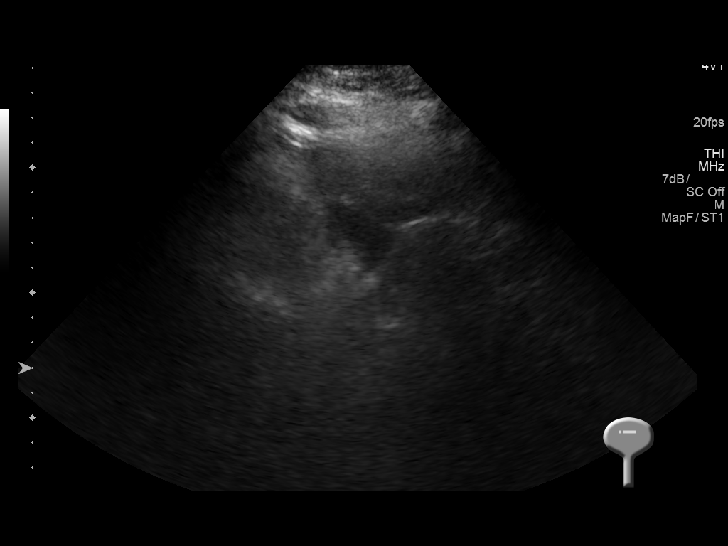

[14 of 22 positions shown; findings below may reference images not displayed]

FINDINGS: Right Kidney:

Length: 10.2 cm. Echogenicity within normal limits. Thinning of the
renal cortex. No hydronephrosis or mass.

Left Kidney:

Length: 10.7 cm. Echogenicity within normal limits. Limited
visualization of the left kidney. Hypoechoic area seen on the prior
ultrasound is not visualized.

Bladder:

Foley catheter is present.  Bladder empty.
IMPRESSION: 1. Suboptimal visualization of left kidney.
2. No hydronephrosis
3. With thinning of the renal cortex consistent .

## 2017-08-05 ENCOUNTER — Other Ambulatory Visit: Payer: Self-pay

## 2017-08-05 ENCOUNTER — Emergency Department (HOSPITAL_COMMUNITY): Payer: Medicare HMO

## 2017-08-05 ENCOUNTER — Inpatient Hospital Stay (HOSPITAL_BASED_OUTPATIENT_CLINIC_OR_DEPARTMENT_OTHER): Payer: Medicare HMO

## 2017-08-05 ENCOUNTER — Encounter (HOSPITAL_COMMUNITY): Payer: Self-pay | Admitting: Emergency Medicine

## 2017-08-05 ENCOUNTER — Observation Stay (HOSPITAL_COMMUNITY)
Admission: EM | Admit: 2017-08-05 | Discharge: 2017-08-06 | Disposition: A | Discharge status: Skilled Nursing Facility | Payer: Medicare HMO | Attending: Internal Medicine | Admitting: Internal Medicine

## 2017-08-05 ENCOUNTER — Inpatient Hospital Stay (HOSPITAL_COMMUNITY): Payer: Medicare HMO

## 2017-08-05 DIAGNOSIS — I340 Nonrheumatic mitral (valve) insufficiency: Secondary | ICD-10-CM

## 2017-08-05 DIAGNOSIS — Z87891 Personal history of nicotine dependence: Secondary | ICD-10-CM | POA: Insufficient documentation

## 2017-08-05 DIAGNOSIS — D500 Iron deficiency anemia secondary to blood loss (chronic): Secondary | ICD-10-CM | POA: Diagnosis not present

## 2017-08-05 DIAGNOSIS — R4781 Slurred speech: Secondary | ICD-10-CM | POA: Diagnosis not present

## 2017-08-05 DIAGNOSIS — I5032 Chronic diastolic (congestive) heart failure: Secondary | ICD-10-CM | POA: Insufficient documentation

## 2017-08-05 DIAGNOSIS — E1122 Type 2 diabetes mellitus with diabetic chronic kidney disease: Secondary | ICD-10-CM | POA: Diagnosis not present

## 2017-08-05 DIAGNOSIS — I10 Essential (primary) hypertension: Secondary | ICD-10-CM | POA: Diagnosis not present

## 2017-08-05 DIAGNOSIS — I130 Hypertensive heart and chronic kidney disease with heart failure and stage 1 through stage 4 chronic kidney disease, or unspecified chronic kidney disease: Secondary | ICD-10-CM | POA: Insufficient documentation

## 2017-08-05 DIAGNOSIS — I482 Chronic atrial fibrillation: Secondary | ICD-10-CM | POA: Insufficient documentation

## 2017-08-05 DIAGNOSIS — Z794 Long term (current) use of insulin: Secondary | ICD-10-CM | POA: Diagnosis not present

## 2017-08-05 DIAGNOSIS — I4892 Unspecified atrial flutter: Secondary | ICD-10-CM | POA: Diagnosis not present

## 2017-08-05 DIAGNOSIS — E875 Hyperkalemia: Secondary | ICD-10-CM

## 2017-08-05 DIAGNOSIS — E1129 Type 2 diabetes mellitus with other diabetic kidney complication: Secondary | ICD-10-CM | POA: Diagnosis present

## 2017-08-05 DIAGNOSIS — Z7951 Long term (current) use of inhaled steroids: Secondary | ICD-10-CM | POA: Diagnosis not present

## 2017-08-05 DIAGNOSIS — K219 Gastro-esophageal reflux disease without esophagitis: Secondary | ICD-10-CM | POA: Diagnosis not present

## 2017-08-05 DIAGNOSIS — I69354 Hemiplegia and hemiparesis following cerebral infarction affecting left non-dominant side: Secondary | ICD-10-CM | POA: Insufficient documentation

## 2017-08-05 DIAGNOSIS — I6300 Cerebral infarction due to thrombosis of unspecified precerebral artery: Secondary | ICD-10-CM

## 2017-08-05 DIAGNOSIS — Z85038 Personal history of other malignant neoplasm of large intestine: Secondary | ICD-10-CM | POA: Diagnosis not present

## 2017-08-05 DIAGNOSIS — F32A Depression, unspecified: Secondary | ICD-10-CM | POA: Diagnosis present

## 2017-08-05 DIAGNOSIS — I6389 Other cerebral infarction: Secondary | ICD-10-CM | POA: Diagnosis not present

## 2017-08-05 DIAGNOSIS — F329 Major depressive disorder, single episode, unspecified: Secondary | ICD-10-CM | POA: Insufficient documentation

## 2017-08-05 DIAGNOSIS — Z79899 Other long term (current) drug therapy: Secondary | ICD-10-CM | POA: Diagnosis not present

## 2017-08-05 DIAGNOSIS — I480 Paroxysmal atrial fibrillation: Secondary | ICD-10-CM | POA: Diagnosis not present

## 2017-08-05 DIAGNOSIS — N184 Chronic kidney disease, stage 4 (severe): Secondary | ICD-10-CM | POA: Diagnosis not present

## 2017-08-05 DIAGNOSIS — I483 Typical atrial flutter: Secondary | ICD-10-CM

## 2017-08-05 DIAGNOSIS — I639 Cerebral infarction, unspecified: Secondary | ICD-10-CM | POA: Diagnosis not present

## 2017-08-05 DIAGNOSIS — R2990 Unspecified symptoms and signs involving the nervous system: Secondary | ICD-10-CM

## 2017-08-05 DIAGNOSIS — R1310 Dysphagia, unspecified: Secondary | ICD-10-CM | POA: Insufficient documentation

## 2017-08-05 DIAGNOSIS — E1140 Type 2 diabetes mellitus with diabetic neuropathy, unspecified: Secondary | ICD-10-CM | POA: Insufficient documentation

## 2017-08-05 DIAGNOSIS — K922 Gastrointestinal hemorrhage, unspecified: Secondary | ICD-10-CM | POA: Diagnosis present

## 2017-08-05 DIAGNOSIS — R29810 Facial weakness: Principal | ICD-10-CM | POA: Insufficient documentation

## 2017-08-05 DIAGNOSIS — I2510 Atherosclerotic heart disease of native coronary artery without angina pectoris: Secondary | ICD-10-CM | POA: Diagnosis not present

## 2017-08-05 DIAGNOSIS — N179 Acute kidney failure, unspecified: Secondary | ICD-10-CM | POA: Diagnosis present

## 2017-08-05 DIAGNOSIS — G459 Transient cerebral ischemic attack, unspecified: Secondary | ICD-10-CM | POA: Insufficient documentation

## 2017-08-05 LAB — COMPREHENSIVE METABOLIC PANEL
ALBUMIN: 3.6 g/dL (ref 3.5–5.0)
ALT: 6 U/L — AB (ref 17–63)
AST: 8 U/L — AB (ref 15–41)
Alkaline Phosphatase: 71 U/L (ref 38–126)
Anion gap: 6 (ref 5–15)
BILIRUBIN TOTAL: 0.6 mg/dL (ref 0.3–1.2)
BUN: 39 mg/dL — AB (ref 6–20)
CHLORIDE: 108 mmol/L (ref 101–111)
CO2: 21 mmol/L — ABNORMAL LOW (ref 22–32)
CREATININE: 2.8 mg/dL — AB (ref 0.61–1.24)
Calcium: 8.8 mg/dL — ABNORMAL LOW (ref 8.9–10.3)
GFR calc Af Amer: 23 mL/min — ABNORMAL LOW (ref >60)
GFR, EST NON AFRICAN AMERICAN: 20 mL/min — AB (ref >60)
GLUCOSE: 128 mg/dL — AB (ref 65–99)
POTASSIUM: 5.9 mmol/L — AB (ref 3.5–5.1)
Sodium: 135 mmol/L (ref 135–145)
TOTAL PROTEIN: 7.3 g/dL (ref 6.5–8.1)

## 2017-08-05 LAB — GLUCOSE, CAPILLARY
GLUCOSE-CAPILLARY: 129 mg/dL — AB (ref 65–99)
GLUCOSE-CAPILLARY: 96 mg/dL (ref 65–99)
GLUCOSE-CAPILLARY: 121 mg/dL — AB (ref 65–99)
Glucose-Capillary: 119 mg/dL — ABNORMAL HIGH (ref 65–99)

## 2017-08-05 LAB — ECHOCARDIOGRAM COMPLETE
AOASC: 36 cm
EWDT: 169 ms
FS: 37 % (ref 28–44)
Height: 70 in
IVS/LV PW RATIO, ED: 1.78
LA diam end sys: 46 mm
LA diam index: 2.18 cm/m2
LA vol A4C: 99.6 ml
LASIZE: 46 mm
LAVOL: 98.8 mL
LAVOLIN: 46.9 mL/m2
LDCA: 3.14 cm2
LVOTD: 20 mm
MV Dec: 169
MVPKEVEL: 1.9 m/s
P 1/2 time: 766 ms
PW: 9 mm — AB (ref 0.6–1.1)
Reg peak vel: 337 cm/s
TAPSE: 17.5 mm
TRMAXVEL: 337 cm/s
Weight: 3118.19 oz

## 2017-08-05 LAB — BASIC METABOLIC PANEL
Anion gap: 7 (ref 5–15)
BUN: 37 mg/dL — AB (ref 6–20)
CHLORIDE: 108 mmol/L (ref 101–111)
CO2: 22 mmol/L (ref 22–32)
CREATININE: 2.85 mg/dL — AB (ref 0.61–1.24)
Calcium: 9.1 mg/dL (ref 8.9–10.3)
GFR calc non Af Amer: 19 mL/min — ABNORMAL LOW (ref >60)
GFR, EST AFRICAN AMERICAN: 22 mL/min — AB (ref >60)
GLUCOSE: 128 mg/dL — AB (ref 65–99)
Potassium: 5.5 mmol/L — ABNORMAL HIGH (ref 3.5–5.1)
Sodium: 137 mmol/L (ref 135–145)

## 2017-08-05 LAB — MRSA PCR SCREENING: MRSA by PCR: NEGATIVE

## 2017-08-05 LAB — DIFFERENTIAL
BASOS ABS: 0 10*3/uL (ref 0.0–0.1)
BASOS PCT: 0 %
EOS PCT: 1 %
Eosinophils Absolute: 0.1 10*3/uL (ref 0.0–0.7)
LYMPHS PCT: 21 %
Lymphs Abs: 1.5 10*3/uL (ref 0.7–4.0)
Monocytes Absolute: 2.3 10*3/uL — ABNORMAL HIGH (ref 0.1–1.0)
Monocytes Relative: 32 %
Neutro Abs: 3.4 10*3/uL (ref 1.7–7.7)
Neutrophils Relative %: 46 %

## 2017-08-05 LAB — HEMOGLOBIN A1C
Hgb A1c MFr Bld: 6 % — ABNORMAL HIGH (ref 4.8–5.6)
Mean Plasma Glucose: 125.5 mg/dL

## 2017-08-05 LAB — I-STAT CHEM 8, ED
BUN: 35 mg/dL — ABNORMAL HIGH (ref 6–20)
CHLORIDE: 110 mmol/L (ref 101–111)
Calcium, Ion: 1.2 mmol/L (ref 1.15–1.40)
Creatinine, Ser: 2.8 mg/dL — ABNORMAL HIGH (ref 0.61–1.24)
GLUCOSE: 128 mg/dL — AB (ref 65–99)
HEMATOCRIT: 33 % — AB (ref 39.0–52.0)
HEMOGLOBIN: 11.2 g/dL — AB (ref 13.0–17.0)
POTASSIUM: 6 mmol/L — AB (ref 3.5–5.1)
Sodium: 140 mmol/L (ref 135–145)
TCO2: 21 mmol/L — ABNORMAL LOW (ref 22–32)

## 2017-08-05 LAB — URINALYSIS, ROUTINE W REFLEX MICROSCOPIC
BILIRUBIN URINE: NEGATIVE
Glucose, UA: NEGATIVE mg/dL
HGB URINE DIPSTICK: NEGATIVE
Ketones, ur: NEGATIVE mg/dL
NITRITE: NEGATIVE
Protein, ur: 100 mg/dL — AB
SPECIFIC GRAVITY, URINE: 1.012 (ref 1.005–1.030)
Squamous Epithelial / LPF: NONE SEEN
pH: 7 (ref 5.0–8.0)

## 2017-08-05 LAB — LIPID PANEL
CHOL/HDL RATIO: 3.5 ratio
CHOLESTEROL: 91 mg/dL (ref 0–200)
HDL: 26 mg/dL — ABNORMAL LOW (ref >40)
LDL CALC: 48 mg/dL (ref 0–99)
TRIGLYCERIDES: 84 mg/dL (ref <150)
VLDL: 17 mg/dL (ref 0–40)

## 2017-08-05 LAB — CBG MONITORING, ED: Glucose-Capillary: 123 mg/dL — ABNORMAL HIGH (ref 65–99)

## 2017-08-05 LAB — CBC
HEMATOCRIT: 34.6 % — AB (ref 39.0–52.0)
HEMOGLOBIN: 11.3 g/dL — AB (ref 13.0–17.0)
MCH: 28.8 pg (ref 26.0–34.0)
MCHC: 32.7 g/dL (ref 30.0–36.0)
MCV: 88.3 fL (ref 78.0–100.0)
Platelets: 170 10*3/uL (ref 150–400)
RBC: 3.92 MIL/uL — ABNORMAL LOW (ref 4.22–5.81)
RDW: 16.3 % — ABNORMAL HIGH (ref 11.5–15.5)
WBC: 7.3 10*3/uL (ref 4.0–10.5)

## 2017-08-05 LAB — PROTIME-INR
INR: 1.12
Prothrombin Time: 14.3 seconds (ref 11.4–15.2)

## 2017-08-05 LAB — I-STAT TROPONIN, ED: TROPONIN I, POC: 0.04 ng/mL (ref 0.00–0.08)

## 2017-08-05 LAB — APTT: APTT: 36 s (ref 24–36)

## 2017-08-05 LAB — BRAIN NATRIURETIC PEPTIDE: B Natriuretic Peptide: 179.2 pg/mL — ABNORMAL HIGH (ref 0.0–100.0)

## 2017-08-05 MED ORDER — INSULIN ASPART 100 UNIT/ML ~~LOC~~ SOLN
0.0000 [IU] | Freq: Three times a day (TID) | SUBCUTANEOUS | Status: DC
  Administered 2017-08-05 – 2017-08-06 (×2): 1 [IU] via SUBCUTANEOUS

## 2017-08-05 MED ORDER — HYDRALAZINE HCL 50 MG PO TABS: 75.0000 mg | ORAL_TABLET | Freq: Three times a day (TID) | ORAL | Status: DC

## 2017-08-05 MED ORDER — ASPIRIN EC 81 MG PO TBEC: 81.0000 mg | DELAYED_RELEASE_TABLET | Freq: Every day | ORAL | Status: DC

## 2017-08-05 MED ORDER — BENZONATATE 100 MG PO CAPS: 100.0000 mg | ORAL_CAPSULE | Freq: Four times a day (QID) | ORAL | Status: DC | PRN

## 2017-08-05 MED ORDER — FLUTICASONE PROPIONATE 50 MCG/ACT NA SUSP
2.0000 | Freq: Every day | NASAL | Status: DC
  Administered 2017-08-05 – 2017-08-06 (×2): 2 via NASAL
  Filled 2017-08-05: qty 16

## 2017-08-05 MED ORDER — DEXTROSE 10 % IV SOLN
Freq: Once | INTRAVENOUS | Status: AC
  Administered 2017-08-05: 50 mL/h via INTRAVENOUS

## 2017-08-05 MED ORDER — ACETAMINOPHEN 650 MG RE SUPP: 650.0000 mg | RECTAL | Status: DC | PRN

## 2017-08-05 MED ORDER — ACETAMINOPHEN 160 MG/5ML PO SOLN: 650.0000 mg | ORAL | Status: DC | PRN

## 2017-08-05 MED ORDER — STROKE: EARLY STAGES OF RECOVERY BOOK
Freq: Once | Status: DC
  Filled 2017-08-05 (×2): qty 1

## 2017-08-05 MED ORDER — ZOLPIDEM TARTRATE 5 MG PO TABS: 5.0000 mg | ORAL_TABLET | Freq: Every evening | ORAL | Status: DC | PRN

## 2017-08-05 MED ORDER — ARIPIPRAZOLE 10 MG PO TABS: 5.0000 mg | ORAL_TABLET | Freq: Every day | ORAL | Status: DC

## 2017-08-05 MED ORDER — ATORVASTATIN CALCIUM 20 MG PO TABS: 20.0000 mg | ORAL_TABLET | Freq: Every day | ORAL | Status: DC

## 2017-08-05 MED ORDER — ONDANSETRON HCL 4 MG/2ML IJ SOLN: 4.0000 mg | Freq: Three times a day (TID) | INTRAMUSCULAR | Status: DC | PRN

## 2017-08-05 MED ORDER — HEPARIN SODIUM (PORCINE) 5000 UNIT/ML IJ SOLN
5000.0000 [IU] | Freq: Three times a day (TID) | INTRAMUSCULAR | Status: DC
  Administered 2017-08-05 – 2017-08-06 (×5): 5000 [IU] via SUBCUTANEOUS
  Filled 2017-08-05 (×5): qty 1

## 2017-08-05 MED ORDER — INSULIN ASPART 100 UNIT/ML ~~LOC~~ SOLN
SUBCUTANEOUS | Status: AC
  Filled 2017-08-05: qty 1

## 2017-08-05 MED ORDER — SODIUM POLYSTYRENE SULFONATE 15 GM/60ML PO SUSP: 30.0000 g | Freq: Once | ORAL | Status: DC

## 2017-08-05 MED ORDER — HYDRALAZINE HCL 20 MG/ML IJ SOLN
5.0000 mg | INTRAMUSCULAR | Status: DC | PRN
  Administered 2017-08-05: 5 mg via INTRAVENOUS
  Filled 2017-08-05: qty 1

## 2017-08-05 MED ORDER — ATORVASTATIN CALCIUM 10 MG PO TABS
20.0000 mg | ORAL_TABLET | Freq: Every day | ORAL | Status: DC
  Administered 2017-08-05 – 2017-08-06 (×2): 20 mg via ORAL
  Filled 2017-08-05 (×2): qty 2

## 2017-08-05 MED ORDER — ATORVASTATIN CALCIUM 80 MG PO TABS: 80.0000 mg | ORAL_TABLET | Freq: Every day | ORAL | Status: DC

## 2017-08-05 MED ORDER — ASPIRIN 300 MG RE SUPP: 150.0000 mg | Freq: Every day | RECTAL | Status: DC

## 2017-08-05 MED ORDER — ACETAMINOPHEN 325 MG PO TABS: 650.0000 mg | ORAL_TABLET | ORAL | Status: DC | PRN

## 2017-08-05 MED ORDER — INSULIN ASPART 100 UNIT/ML IV SOLN
10.0000 [IU] | Freq: Once | INTRAVENOUS | Status: AC
  Administered 2017-08-05: 10 [IU] via INTRAVENOUS

## 2017-08-05 MED ORDER — CLONIDINE HCL 0.1 MG/24HR TD PTWK: 0.1000 mg | MEDICATED_PATCH | TRANSDERMAL | Status: DC

## 2017-08-05 MED ORDER — FUROSEMIDE 10 MG/ML IJ SOLN
40.0000 mg | Freq: Once | INTRAMUSCULAR | Status: AC
  Administered 2017-08-05: 40 mg via INTRAVENOUS
  Filled 2017-08-05: qty 4

## 2017-08-05 MED ORDER — INSULIN GLARGINE 100 UNIT/ML ~~LOC~~ SOLN
10.0000 [IU] | Freq: Every day | SUBCUTANEOUS | Status: DC
  Administered 2017-08-05 – 2017-08-06 (×2): 10 [IU] via SUBCUTANEOUS
  Filled 2017-08-05 (×2): qty 0.1

## 2017-08-05 MED ORDER — DEXTROSE 50 % IV SOLN
1.0000 | Freq: Once | INTRAVENOUS | Status: AC
  Administered 2017-08-05: 50 mL via INTRAVENOUS
  Filled 2017-08-05: qty 50

## 2017-08-05 MED ORDER — PANTOPRAZOLE SODIUM 40 MG PO TBEC
40.0000 mg | DELAYED_RELEASE_TABLET | Freq: Two times a day (BID) | ORAL | Status: DC
  Administered 2017-08-05 – 2017-08-06 (×4): 40 mg via ORAL
  Filled 2017-08-05 (×4): qty 1

## 2017-08-05 MED ORDER — ASPIRIN 325 MG PO TABS: 325.0000 mg | ORAL_TABLET | Freq: Every day | ORAL | Status: DC

## 2017-08-05 MED ORDER — AMMONIUM LACTATE 12 % EX LOTN
1.0000 "application " | TOPICAL_LOTION | Freq: Two times a day (BID) | CUTANEOUS | Status: DC
  Administered 2017-08-05 – 2017-08-06 (×4): 1 via TOPICAL
  Filled 2017-08-05: qty 400

## 2017-08-05 MED ORDER — FERROUS SULFATE 325 (65 FE) MG PO TABS
325.0000 mg | ORAL_TABLET | Freq: Three times a day (TID) | ORAL | Status: DC
  Administered 2017-08-05 – 2017-08-06 (×6): 325 mg via ORAL
  Filled 2017-08-05 (×6): qty 1

## 2017-08-05 MED ORDER — PANTOPRAZOLE SODIUM 40 MG PO TBEC: 40.0000 mg | DELAYED_RELEASE_TABLET | Freq: Every day | ORAL | Status: DC

## 2017-08-05 MED ORDER — HYDRALAZINE HCL 20 MG/ML IJ SOLN: 5.0000 mg | INTRAMUSCULAR | Status: DC | PRN

## 2017-08-05 MED ORDER — SENNA 8.6 MG PO TABS: 2.0000 | ORAL_TABLET | Freq: Three times a day (TID) | ORAL | Status: DC | PRN

## 2017-08-05 MED ORDER — ASPIRIN 300 MG RE SUPP
300.0000 mg | Freq: Every day | RECTAL | Status: DC
  Filled 2017-08-05: qty 1

## 2017-08-05 MED ORDER — ASPIRIN 300 MG RE SUPP: 300.0000 mg | Freq: Every day | RECTAL | Status: DC

## 2017-08-05 MED ORDER — HYDROCODONE-ACETAMINOPHEN 5-325 MG PO TABS
1.0000 | ORAL_TABLET | Freq: Four times a day (QID) | ORAL | Status: DC | PRN
  Filled 2017-08-05: qty 1

## 2017-08-05 MED ORDER — ASPIRIN EC 325 MG PO TBEC
325.0000 mg | DELAYED_RELEASE_TABLET | Freq: Every day | ORAL | Status: DC
  Administered 2017-08-05 – 2017-08-06 (×2): 325 mg via ORAL
  Filled 2017-08-05 (×3): qty 1

## 2017-08-05 MED ORDER — RISPERIDONE 1 MG PO TABS
1.0000 mg | ORAL_TABLET | Freq: Every day | ORAL | Status: DC
  Administered 2017-08-05 – 2017-08-06 (×2): 1 mg via ORAL
  Filled 2017-08-05: qty 2
  Filled 2017-08-05 (×2): qty 1
  Filled 2017-08-05: qty 2

## 2017-08-05 NOTE — Care Management CC44 (Signed)
Condition Code 44 Documentation Completed  Patient Details  Name: Rahmon Heigl MRN: [id number] Date of Birth: 1933-10-27   Condition Code 44 given:  Yes Patient signature on Condition Code 44 notice:  (no- could not obtain signature) Documentation of 2 MD's agreement:  Yes Code 44 added to claim:  Yes    Carles Collet, RN 08/05/2017, 2:28 PM

## 2017-08-05 NOTE — Progress Notes (Signed)
Offered patient hydrocodone, and patient refused. Would not take a drink of water and stated"Idon't want any" Warm pack offered but patient refused. Yellow sponge placed under patients head, and he said that was better. Charge nurse and oncoming nurse made aware.

## 2017-08-05 NOTE — Progress Notes (Signed)
  Echocardiogram 2D Echocardiogram has been performed.  [name] 08/05/2017, 12:11 PM

## 2017-08-05 NOTE — Progress Notes (Signed)
STROKE TEAM PROGRESS NOTE  Admission history; [name] is a 83 y.o. male who has a past medical history of atrial fibrillation not on anticoagulation because of GI bleed, pancytopenia/thrombocytopenia, colon cancer, chronic diastolic CHF, CKD 1, coronary artery disease, previous stroke with some left-sided weakness, depression who was brought in from his facility when he was noted to have left-sided facial droop, dysarthria.  According to the report, his last known normal was 12:15 AM and he was seen in the ER around 1:35 AM.  Upon further questioning the EMS, it was unclear whether 1215 and was his last known normal was or was that the time when he was seen and found to be not in his normal state of health.  He has had a history of right thalamic stroke with some left-sided deficits.  Upon initial assessment by EMS, his systolic blood pressures were in the 220s.  Blood pressures prior to EMS arrival by the facility were in the 180s. Patient does complain of slurred speech and possible left facial droop.  He feels weaker on the left but could not tell me whether his weakness is old or new.  LKW: 12:15 AM on 08/05/2017. tpa given?: no, mild stroke symptoms, low delta NIH, h/o GIB on anticoag Premorbid modified Rankin scale (mRS): 3-4  SUBJECTIVE (INTERVAL HISTORY) No family is at the bedside. Patient is found sitting in bed in NAD, drooling.  Overall he feels his condition is unchanged. Understands he has had a news stroke. Voices no new complaints. No new events reported overnight.   OBJECTIVE Lab Results: CBC:  Recent Labs  Lab 08/05/17 0127 08/05/17 0139  WBC 7.3  --   HGB 11.3* 11.2*  HCT 34.6* 33.0*  MCV 88.3  --   PLT 170  --    BMP: Recent Labs  Lab 08/05/17 0127 08/05/17 0139 08/05/17 0847  NA 135 140 137  K 5.9* 6.0* 5.5*  CL 108 110 108  CO2 21*  --  22  GLUCOSE 128* 128* 128*  BUN 39* 35* 37*  CREATININE 2.80* 2.80* 2.85*  CALCIUM 8.8*  --  9.1   Liver  Function Tests:  Recent Labs  Lab 08/05/17 0127  AST 8*  ALT 6*  ALKPHOS 71  BILITOT 0.6  PROT 7.3  ALBUMIN 3.6   Coagulation Studies:  Recent Labs    08/05/17 0127  APTT 36  INR 1.12   PHYSICAL EXAM Temp:  [98 F (36.7 C)-98.8 F (37.1 C)] 98 F (36.7 C) (12/11 0918) Pulse Rate:  [43-66] 66 (12/11 0918) Resp:  [15-20] 18 (12/11 0918) BP: (148-236)/(33-77) 148/54 (12/11 0918) SpO2:  [98 %-100 %] 100 % (12/11 0918) Weight:  [88.4 kg (194 lb 14.2 oz)] 88.4 kg (194 lb 14.2 oz) (12/11 0203) General - Well nourished, well developed, in no apparent distress Respiratory - Lungs clear bilaterally. No wheezing. Cardiovascular - Regular rate and rhythm  NEURO:  Mental Status: AA&Ox3  Language: speech is dysarthric but mostly understandable.  Naming, repetition, fluency, and comprehension intact.positive glabellar tap noted. Diminished  Cranial Nerves: PERRL. EOMI, visual fields full, obvious droop of the left angle of the mouth, facial sensation intact, facial drooling notedhearing intact, tongue/uvula/soft palate midline, normal sternocleidomastoid and trapezius muscle strength. No evidence of tongue atrophy or fibrillations Motor: 5/5 right upper extremity.  2/5 right lower extremity.  4/5 left upper extremity.  2/5 left lower extremity.  Bradykinesia noted in both upper extremities. Tone: Examination of tone in the upper extremity shows cogwheeling and  spontaneous resting tremor left greater than right.  Masked facies appearance on the face. Sensation-decreased sensation to  light touch on the left hemibody. Coordination: FTN intact bilaterally.  Unable to perform lower extremity coordination testing Gait- deferred  IMAGING: I have personally reviewed the radiological images below and agree with the radiology interpretations.  MRI/ MRA Head Wo Contrast Result Date: 08/05/2017 IMPRESSION: 1. No acute intracranial abnormality identified. 2. Progression of chronic microvascular  ischemic changes with new small chronic infarctions in bilateral cerebellar hemisphere and left pons. 3. Mild paranasal sinus disease. 4. Negative MRA of the head. No large vessel occlusion, aneurysm, or significant stenosis is identified.   Ct Head Code Stroke Wo Contrast Result Date: 08/05/2017 IMPRESSION: 1. No acute intracranial abnormality identified. 2. ASPECTS is 10 3. Stable mild for age chronic microvascular ischemic changes and mild parenchymal volume loss of the brain. Stable small chronic lacunar infarct of right thalamus and very small chronic infarction of left cerebellar hemisphere.   Echocardiogram:                                          PENDING B/L Carotid U/S:                                                PENDING _____________________________________________________________________ ASSESSMENT: [name] is a 83 y.o. male with PMH of prior right thalamic stroke with residual left-sided weakness, history of atrial fibrillation not on anticoagulation because of GI bleed, Hx of pancytopenia/thrombocytopenia, colon cancer, chronic diastolic CHF, CKD 1, coronary artery disease brought in from his facility when he was noted to have acute onset left-sided facial droop, dysarthria and left-sided weakness and hypertensive urgency as his systolic blood pressures were in 220's. Decision was made not to use IV TPA as risks outweighed benefits at this time. No signs of large vessel occlusion on exam hence not a candidate for endovascular procedures. Imaging thus far demonstrates:  Stable small chronic lacunar infarct of right thalamus very small chronic infarction of left cerebellar hemisphere.   Likely TIA:  Suspected Etiology: likely represents recrudescence of old symptoms in the setting of hypertension versus new small vessel or embolic stroke. Resultant Symptoms: Baseline findings: left-sided facial droop, dysarthria and left-sided weakness  Stroke Risk Factors: atrial  fibrillation, diabetes mellitus, hyperlipidemia, hypertension and Colon Cancer, CKD, CHF Other Stroke Risk Factors: Advanced age, Hx stroke, Coronary artery disease  Outstanding Stroke Work-up Studies: Echocardiogram: not done                                   PENDING B/L Carotid U/S:                                                     PENDING  08/05/17: Neuro exam remained stable with baseline findings from prior stroke of left facial droop dysarthria and left-sided weakness.  Patient likely had TIA as MRI is negative for acute stroke.  Echocardiogram and carotid ultrasound are pending continue aspirin and statin for now.  PLAN  08/05/2017: Continue EC Aspirin/ Statin Ongoing aggressive stroke risk factor management Patient counseled to be compliant with his antithrombotic medications Follow up with [facility] in 6 weeks  HX OF STROKES: Baseline Findings:  right thalamic stroke with residual left-sided weakness   AFIB, CHRONIC: Not on long-term anticoagulation due to Hx of GI bleed  DYSPHAGIA: NPO until passes SLP swallow evaluation  Drug-induced parkinsonism Abilify Discontinued Will continue to monitor Follow up with [facility] in 6 weeks  FLUID/ELECTROLYTE DISORDERS Hyper- Potassium -per medicine team  HYPERTENSION: Stable, some elevated B/P's overnight Permissive hypertension (OK if <220/120) for 24-48 hours post stroke and then gradually normalized within 5-7 days. Hydralazine PRN in progress Long term BP goal normotensive. May slowly restart home B/P medications after 48 hours Home Meds: Clonidine, Hydralzaine  HYPERLIPIDEMIA:    Component Value Date/Time   CHOL 91 08/05/2017 0409   TRIG 84 08/05/2017 0409   HDL 26 (L) 08/05/2017 0409   CHOLHDL 3.5 08/05/2017 0409   VLDL 17 08/05/2017 0409   LDLCALC 48 08/05/2017 0409  Home Meds:  Lipitor 20 mg LDL  goal < 70 Continued on  Lipitor to 20 mg daily Continue statin at  discharge  DIABETES: Lab Results  Component Value Date   HGBA1C 6.0 (H) 08/05/2017  HgbA1c goal < 7.0 Currently on: Novolog & Lantus Continue CBG monitoring and SSI DM education   Other Active Problems: Principal Problem:   Stroke (cerebrum) (HCC) Active Problems:   Chronic diastolic CHF (congestive heart failure) (HCC)   PAF (paroxysmal atrial fibrillation) (HCC)   Atrial flutter (HCC)   HTN (hypertension)   GI bleeding   CKD (chronic kidney disease), stage IV (HCC)   Iron deficiency anemia due to chronic blood loss   Hyperkalemia   Type II diabetes mellitus with renal manifestations (HCC)   GERD (gastroesophageal reflux disease)   Depression   CAD (coronary artery disease)  Hospital day # 0  VTE prophylaxis:  Heparin Diet : DIET SOFT Room service appropriate? Yes; Fluid consistency: Thin   Prior Home Stroke Medications: No antithrombotic   Discharge Stroke Meds: Please discharge patient on aspirin 325 mg daily and Lipitor 20 mg , recommend enteric coated ASA due to hx of GI bleed  Disposition: 03-Skilled Nursing Facility Therapy Recs:  SNF Follow Recs:  Follow-up Information    Garvin Fila, MD. Schedule an appointment as soon as possible for a visit in 6 week(s).   Specialties:  Neurology, Radiology Contact information: [address] [place] [address] [phone]          [name], [place]- PCP follow-up in 1-2 weeks  FAMILY UPDATES: No family at bedside  TEAM UPDATES: Kerney Elbe, [place], [place] Stroke Neurology Team 08/05/2017 12:18 PM I have personally examined this patient, reviewed notes, independently viewed imaging studies, participated in medical decision making and plan of care.ROS completed by me personally and pertinent positives fully documented  I have made any additions or clarifications directly to the above note. Agree with note above. Has presented with transient left facial and upper extremity  weakness and numbness in the setting of her remote right thalamic infarct, unclear whether this represents TIA versus worsening of old deficits. Continue ongoing stroke workup. Aspirin 325 mg daily. No family available at the bedside for discussion. Greater than 50% time during this 25 minute visit was spent on counseling and coordination of care about TIA and stroke and answering questions  Antony Contras, MD Medical Director [facility] Pager: [phone] 08/05/2017 1:48 PM  To contact Stroke Continuity provider, please refer to [email or web address] After hours, contact General Neurology

## 2017-08-05 NOTE — ED Provider Notes (Signed)
[facility] EMERGENCY DEPARTMENT Provider Note   CSN: [id number] Arrival date & time: 08/05/17  0127   An emergency department physician performed an initial assessment on this suspected stroke patient at [facility].   Level 5 caveat due to altered mental status  History   Chief Complaint Chief Complaint  Patient presents with  . Code Stroke    HPI [name] is a 83 y.o. male.  The history is provided by the EMS personnel. The history is limited by the condition of the patient.  Weakness  Primary symptoms include focal weakness, speech change. This is a new problem. The current episode started 1 to 2 hours ago. The problem has been rapidly worsening. There was left facial and left upper extremity focality noted. There has been no fever.  Patient with history of atrial flutter, history of CHF, chronic kidney disease presents with concern for stroke Patient is currently staying at [facility] rehab Per EMS, patient was last known well at 12:15 AM on December 11 He was later found to have a left facial droop, left arm weakness and difficulty speaking  Past Medical History:  Diagnosis Date  . Atrial fibrillation ([place])   . Atrial flutter ([place])    hx/notes 11/28/2016  . CHF (congestive heart failure) ([place])   . Chronic diastolic CHF (congestive heart failure) ([place])    hx/notes 11/28/2016  . CKD (chronic kidney disease), stage IV ([place])    hx/notes 11/28/2016  . Colon cancer ([place])    hx/notes 11/28/2016  . Coronary artery disease   . Depression    hx/notes 11/28/2016  . Diabetic neuropathy ([place])    hx/notes 11/28/2016  . GI bleeding 11/27/2016   notes 11/28/2016  . History of blood transfusion 11/28/2016   hx/notes 11/28/2016  . Hypertension   . IDDM (insulin dependent diabetes mellitus) ([place])    uncontrolled; hx/notes 11/28/2016    Patient Active Problem List   Diagnosis Date Noted  . Anger reaction 12/04/2016  . Flat affect 12/04/2016  . Iron deficiency anemia  due to chronic blood loss 12/02/2016  . GI bleeding 11/27/2016  . CKD (chronic kidney disease), stage IV ([place]) 11/27/2016  . Pressure injury of skin 09/18/2016  . Leukocytosis   . Mobitz type 1 second degree AV block 09/11/2016  . Pericardial effusion- moderate on CT scan 09/11/2016  . HTN (hypertension) 09/05/2016  . Acute urinary retention 09/05/2016  . Severe obesity (BMI >= 40) ([place]) 09/05/2016  . Acute CVA (cerebrovascular accident) ([place]) 12/09/2015  . Bradycardia 12/08/2015  . Fall 12/08/2015  . Pancytopenia ([place]) 12/08/2015  . Atrial flutter ([place]) 08/29/2015  . Hypoxia 08/28/2015  . PAF (paroxysmal atrial fibrillation) ([place])   . Noncompliance with medications 08/03/2015  . Chronic diastolic CHF (congestive heart failure) ([place]) 07/31/2015  . Cellulitis of left lower extremity 07/31/2015  . Uncontrolled diabetes mellitus with diabetic nephropathy, with long-term current use of insulin ([place]) 07/31/2015  . Accelerated hypertension 07/31/2015  . Thrombocytopenia ([place]) 07/31/2015  . Lymphedema 06/25/2011    Past Surgical History:  Procedure Laterality Date  . COLONOSCOPY  06/21/2012   Procedure: COLONOSCOPY;  Surgeon: Juanita Craver, MD;  Location: [facility] ENDOSCOPY;  Service: Endoscopy;  Laterality: N/A;  . COLONOSCOPY Left 02/13/2014   Procedure: COLONOSCOPY;  Surgeon: Juanita Craver, MD;  Location[facility] ENDOSCOPY;  Service: Endoscopy;  Laterality: Left;  . ESOPHAGOGASTRODUODENOSCOPY  06/19/2012   Procedure: ESOPHAGOGASTRODUODENOSCOPY (EGD);  Surgeon: Beryle Beams, MD;  Location: [facility] ENDOSCOPY;  Service: Endoscopy;  Laterality: N/A;  .  LAPAROSCOPIC RIGHT COLECTOMY Right 02/15/2014   Procedure: LAPAROSCOPIC ASISSTED RIGHT  COLECTOMY, POSSIBLE OPEN;  Surgeon: Rolm Bookbinder, MD;  Location: [facility];  Service: General;  Laterality: Right;  . LEG SURGERY     Left leg surgery. broken leg       Home Medications    Prior to Admission medications   Medication Sig Start Date End Date  Taking? Authorizing Provider  ARIPiprazole (ABILIFY) 5 MG tablet Give 2.5 tablet by mouth one time a day    [provider]  atorvastatin (LIPITOR) 20 MG tablet Take 1 tablet (20 mg total) by mouth daily at 6 PM. 12/12/15   Rai, Ripudeep K, MD  ferrous sulfate 325 (65 FE) MG EC tablet Take 1 tablet (325 mg total) by mouth 3 (three) times daily with meals. 11/29/16   Janece Canterbury, MD  fluticasone (FLONASE) 50 MCG/ACT nasal spray Place 2 sprays into both nostrils daily.    [provider]  furosemide (LASIX) 40 MG tablet Take 3 tablets (120 mg total) by mouth 2 (two) times daily. 09/18/16   Eber Jones, MD  hydrALAZINE (APRESOLINE) 25 MG tablet Take 3 tablets (75 mg total) by mouth every 8 (eight) hours. 09/18/16   Eber Jones, MD  HYDROcodone-acetaminophen (NORCO/VICODIN) 5-325 MG tablet Take 1 tablet by mouth every 6 (six) hours as needed for moderate pain. 12/02/16   Reed, Tiffany L, DO  Insulin Glargine (LANTUS SOLOSTAR) 100 UNIT/ML Solostar Pen Inject 15 Units into the skin daily at 10 pm. 12/04/16   Hendricks Limes, MD  pantoprazole (PROTONIX) 40 MG tablet Take 1 tablet (40 mg total) by mouth daily at 6 (six) AM. 11/30/16   Short, Noah Delaine, MD  polyethylene glycol (MIRALAX / GLYCOLAX) packet Take 17 g by mouth daily.    [provider]  senna (SENOKOT) 8.6 MG TABS tablet Take 2 tablets by mouth at bedtime.    [provider]    Family History Family History  Problem Relation Age of Onset  . Brain cancer Mother   . Diabetes type II Sister   . Narcolepsy Paternal Uncle     Social History Social History   Tobacco Use  . Smoking status: Former Smoker    Last attempt to quit: 08/26/1998    Years since quitting: 18.9  . Smokeless tobacco: Never Used  Substance Use Topics  . Alcohol use: No    Comment: in the past  . Drug use: No     Allergies   Metformin and related   Review of Systems Review of Systems  Unable to perform  ROS: Mental status change  Neurological: Positive for speech change, focal weakness and weakness.     Physical Exam Updated Vital Signs BP (!) 191/52   Pulse 60   Temp 98.8 F (37.1 C) (Oral)   Resp 18   Ht 1.778 m (5\' 10" )   Wt 88.4 kg (194 lb 14.2 oz)   SpO2 100%   BMI 27.96 kg/m   Physical Exam CONSTITUTIONAL: Elderly HEAD: Normocephalic/atraumatic EYES: PERRL ENMT: Mucous membranes moist NECK: supple no meningeal signs SPINE/BACK:entire spine nontender CV: Irregular LUNGS: Lungs are clear to auscultation bilaterally, no apparent distress ABDOMEN: soft, nontender NEURO: Pt is awake/alert, patient is dysarthric, appears to have left arm droop, left facial droop, patient appears to have baseline weakness in both legs EXTREMITIES: pulses normal/equal, full ROM SKIN: warm, color normal PSYCH: Unable to assess  ED Treatments / Results  Labs (all labs ordered are listed,  but only abnormal results are displayed) Labs Reviewed  CBC - Abnormal; Notable for the following components:      Result Value   RBC 3.92 (*)    Hemoglobin 11.3 (*)    HCT 34.6 (*)    RDW 16.3 (*)    All other components within normal limits  DIFFERENTIAL - Abnormal; Notable for the following components:   Monocytes Absolute 2.3 (*)    All other components within normal limits  COMPREHENSIVE METABOLIC PANEL - Abnormal; Notable for the following components:   Potassium 5.9 (*)    CO2 21 (*)    Glucose, Bld 128 (*)    BUN 39 (*)    Creatinine, Ser 2.80 (*)    Calcium 8.8 (*)    AST 8 (*)    ALT 6 (*)    GFR calc non Af Amer 20 (*)    GFR calc Af Amer 23 (*)    All other components within normal limits  CBG MONITORING, ED - Abnormal; Notable for the following components:   Glucose-Capillary 123 (*)    All other components within normal limits  I-STAT CHEM 8, ED - Abnormal; Notable for the following components:   Potassium 6.0 (*)    BUN 35 (*)    Creatinine, Ser 2.80 (*)    Glucose, Bld  128 (*)    TCO2 21 (*)    Hemoglobin 11.2 (*)    HCT 33.0 (*)    All other components within normal limits  PROTIME-INR  APTT  I-STAT TROPONIN, ED    EKG ED ECG REPORT   Date: 08/05/2017 0156am  Rate: 50  Rhythm: atrial flutter  QRS Axis: left  Intervals: normal  ST/T Wave abnormalities: nonspecific ST changes  Conduction Disutrbances:nonspecific intraventricular conduction delay  Narrative Interpretation:   Old EKG Reviewed: unchanged  I have personally reviewed the EKG tracing and disagree with the computerized printout as noted. Pt with atrial flutter  Radiology Ct Head Code Stroke Wo Contrast  Result Date: 08/05/2017 CLINICAL DATA:  Code stroke. 83 y/o M; left-sided deficits and slurred speech. EXAM: CT HEAD WITHOUT CONTRAST TECHNIQUE: Contiguous axial images were obtained from the base of the skull through the vertex without intravenous contrast. COMPARISON:  12/07/2016 CT head.  12/07/2016 MRI head. FINDINGS: Brain: No evidence of acute infarction, hemorrhage, hydrocephalus, extra-axial collection or mass lesion/mass effect. Stable very small chronic infarction within left cerebellar hemisphere in chronic lacunar infarction within right thalamus. Stable mild chronic microvascular ischemic changes and parenchymal volume loss of the brain for age. Vascular: Calcific atherosclerosis of carotid siphons. No hyperdense vessel identified. Skull: Normal. Negative for fracture or focal lesion. Sinuses/Orbits: No acute finding. Other: None. ASPECTS [facility] Stroke Program Early CT Score) - Ganglionic level infarction (caudate, lentiform nuclei, internal capsule, insula, M1-M3 cortex): 7 - Supraganglionic infarction (M4-M6 cortex): 3 Total score (0-10 with 10 being normal): 10 IMPRESSION: 1. No acute intracranial abnormality identified. 2. ASPECTS is 10 3. Stable mild for age chronic microvascular ischemic changes and mild parenchymal volume loss of the brain. Stable small chronic lacunar  infarct of right thalamus and very small chronic infarction of left cerebellar hemisphere. These results were communicated to Dr. Rory Percy at 1:50 amon 12/11/2018by text page via the [facility] messaging system. Electronically Signed   By: Kristine Garbe M.D.   On: 08/05/2017 01:50    Procedures Procedures   CRITICAL CARE Performed by: Sharyon Cable Total critical care time: 31 minutes Critical care time was exclusive of separately  billable procedures and treating other patients. Critical care was necessary to treat or prevent imminent or life-threatening deterioration. Critical care was time spent personally by me on the following activities: development of treatment plan with patient and/or surrogate as well as nursing, discussions with consultants, evaluation of patient's response to treatment, examination of patient, obtaining history from patient or surrogate, ordering and performing treatments and interventions, ordering and review of laboratory studies, ordering and review of radiographic studies, pulse oximetry and re-evaluation of patient's condition. Patient with hyperkalemia, requiring intervention, and requiring admission Also with worsening stroke  Medications Ordered in ED Medications  insulin aspart (novoLOG) 100 UNIT/ML injection (0 Units  Hold 08/05/17 0222)  Ammonium Lactate 70 % SOLN 1 application (not administered)  ARIPiprazole (ABILIFY) tablet 5 mg (not administered)  atorvastatin (LIPITOR) tablet 20 mg (not administered)  benzonatate (TESSALON) capsule 100 mg (not administered)  cloNIDine (CATAPRES - Dosed in mg/24 hr) patch 0.1 mg (not administered)  ferrous sulfate EC tablet 325 mg (not administered)  fluticasone (FLONASE) 50 MCG/ACT nasal spray 2 spray (not administered)  hydrALAZINE (APRESOLINE) tablet 75 mg (not administered)  HYDROcodone-acetaminophen (NORCO/VICODIN) 5-325 MG per tablet 1 tablet (not administered)  Insulin Glargine (LANTUS) Solostar Pen  10 Units (not administered)  pantoprazole (PROTONIX) EC tablet 40 mg (not administered)  risperiDONE (RISPERDAL) 1 MG/ML oral solution 1 mg (not administered)  senna (SENOKOT) tablet 17.2 mg (not administered)  hydrALAZINE (APRESOLINE) injection 5 mg (not administered)  ondansetron (ZOFRAN) injection 4 mg (not administered)  zolpidem (AMBIEN) tablet 5 mg (not administered)   stroke: mapping our early stages of recovery book (not administered)  acetaminophen (TYLENOL) tablet 650 mg (not administered)    Or  acetaminophen (TYLENOL) solution 650 mg (not administered)    Or  acetaminophen (TYLENOL) suppository 650 mg (not administered)  heparin injection 5,000 Units (not administered)  furosemide (LASIX) injection 40 mg (40 mg Intravenous Given 08/05/17 0211)  insulin aspart (novoLOG) injection 10 Units (10 Units Intravenous Given 08/05/17 0211)  dextrose 50 % solution 50 mL (50 mLs Intravenous Given 08/05/17 0211)  dextrose 10 % infusion (50 mL/hr Intravenous New Bag/Given 08/05/17 0220)     Initial Impression / Assessment and Plan / ED Course  I have reviewed the triage vital signs and the nursing notes.  Pertinent labs & imaging results that were available during my care of the patient were reviewed by me and considered in my medical decision making (see chart for details).     3:31 AM Patient seen on arrival with neurology for concern for code stroke With apparent worsening left-sided weakness, but unclear what his baseline is Discussion with neurologist Dr. Rory Percy, he is not a candidate for TPA or intervention Patient also noted to have hyperkalemia without acute EKG changes, medications were given Per neurology recommendations patient will be admitted to the hospitalist Patient checked on again, no acute changes  tPA in stroke considered but not given due to: Unclear baseline/neurology recommendation 4:06 AM Discussed with Dr. Blaine Hamper for admission Final Clinical Impressions(s) /  ED Diagnoses   Final diagnoses:  Cerebrovascular accident (CVA) due to other mechanism  Hyperkalemia  Atrial flutter, chronic ([place])  Chronic renal failure, stage 4 (severe) [facility])    ED Discharge Orders    None       Ripley Fraise, MD 08/05/17 [phone]

## 2017-08-05 NOTE — Progress Notes (Signed)
Carotid duplex prelim: Right 1-39% ICA stenosis. Left 60-79% ICA stenosis, based on systolic velocity, and 5-78% based on diastolic velocity. Technically difficult to evaluate left side due to patient keeping head turned to left. Landry Mellow, RDMS, RVT

## 2017-08-05 NOTE — Evaluation (Signed)
Speech Language Pathology Evaluation Patient Details Name: [name] MRN: [id number] DOB: 04-10-1934 Today's Date: 08/05/2017 Time: 1047-1100 SLP Time Calculation (min) (ACUTE ONLY): 13 min  Problem List:  Patient Active Problem List   Diagnosis Date Noted  . Hyperkalemia 08/05/2017  . Type II diabetes mellitus with renal manifestations ([place]) 08/05/2017  . GERD (gastroesophageal reflux disease) 08/05/2017  . Depression 08/05/2017  . CAD (coronary artery disease) 08/05/2017  . TIA (transient ischemic attack) 08/05/2017  . Atrial flutter, chronic ([place])   . Chronic renal failure, stage 4 (severe) ([facility])   . Anger reaction 12/04/2016  . Flat affect 12/04/2016  . Iron deficiency anemia due to chronic blood loss 12/02/2016  . GI bleeding 11/27/2016  . CKD (chronic kidney disease), stage IV ([place]) 11/27/2016  . Pressure injury of skin 09/18/2016  . Leukocytosis   . Mobitz type 1 second degree AV block 09/11/2016  . Pericardial effusion- moderate on CT scan 09/11/2016  . HTN (hypertension) 09/05/2016  . Acute urinary retention 09/05/2016  . Severe obesity (BMI >= 40) ([place]) 09/05/2016  . Stroke (cerebrum) ([place]) 12/09/2015  . Bradycardia 12/08/2015  . Fall 12/08/2015  . Pancytopenia ([place]) 12/08/2015  . Atrial flutter ([place]) 08/29/2015  . Hypoxia 08/28/2015  . PAF (paroxysmal atrial fibrillation) ([place])   . Noncompliance with medications 08/03/2015  . Chronic diastolic CHF (congestive heart failure) ([place]) 07/31/2015  . Cellulitis of left lower extremity 07/31/2015  . Uncontrolled diabetes mellitus with diabetic nephropathy, with long-term current use of insulin ([place]) 07/31/2015  . Accelerated hypertension 07/31/2015  . Thrombocytopenia ([place]) 07/31/2015  . Lymphedema 06/25/2011   Past Medical History:  Past Medical History:  Diagnosis Date  . Atrial fibrillation ([place])   . Atrial flutter ([place])    hx/notes 11/28/2016  . CHF (congestive heart failure) ([place])   . Chronic diastolic  CHF (congestive heart failure) ([place])    hx/notes 11/28/2016  . CKD (chronic kidney disease), stage IV ([place])    hx/notes 11/28/2016  . Colon cancer ([place])    hx/notes 11/28/2016  . Coronary artery disease   . Depression    hx/notes 11/28/2016  . Diabetic neuropathy ([place])    hx/notes 11/28/2016  . GI bleeding 11/27/2016   notes 11/28/2016  . History of blood transfusion 11/28/2016   hx/notes 11/28/2016  . Hypertension   . IDDM (insulin dependent diabetes mellitus) ([place])    uncontrolled; hx/notes 11/28/2016   Past Surgical History:  Past Surgical History:  Procedure Laterality Date  . COLONOSCOPY  06/21/2012   Procedure: COLONOSCOPY;  Surgeon: Juanita Craver, MD;  Location: [facility] ENDOSCOPY;  Service: Endoscopy;  Laterality: N/A;  . COLONOSCOPY Left 02/13/2014   Procedure: COLONOSCOPY;  Surgeon: Juanita Craver, MD;  Location: [facility] ENDOSCOPY;  Service: Endoscopy;  Laterality: Left;  . ESOPHAGOGASTRODUODENOSCOPY  06/19/2012   Procedure: ESOPHAGOGASTRODUODENOSCOPY (EGD);  Surgeon: Beryle Beams, MD;  Location: [facility] ENDOSCOPY;  Service: Endoscopy;  Laterality: N/A;  . LAPAROSCOPIC RIGHT COLECTOMY Right 02/15/2014   Procedure: LAPAROSCOPIC ASISSTED RIGHT  COLECTOMY, POSSIBLE OPEN;  Surgeon: Rolm Bookbinder, MD;  Location: [facility];  Service: General;  Laterality: Right;  . LEG SURGERY     Left leg surgery. broken leg   HPI:  83 year old man with multiple cerebrovascular risk factors and a history of old right thalamic stroke with residual left-sided weakness presents for acute onset of left facial droop, dysarthria and left-sided weakness. Progression of chronic microvascular ischemic changes with newer (since last scan) small chronic infarctions in bilateral cerebellar hemisphere and left pons.  Assessment / Plan / Recommendation Clinical Impression  Pt presents with inconsistent communication behaviors - speech is mildly dysarthric; pt communicating in short, agrammatic-type phrases (e.g., "back hurt,"  "no  drink"), with delayed response time, but with PT he was communicating with more fluidity and spontaneity.  He follows basic commands and makes needs known.  Uncertain of speech/cognitive baseline.  Drooling, masked facies being attributed to drug-induced parkinsonism. Pt for probable D/C back to SNF next date - recommend further SLP evaluation once he returns to facility.    SLP Assessment  SLP Recommendation/Assessment: All further Speech Lanaguage Pathology  needs can be addressed in the next venue of care SLP Visit Diagnosis: Dysphagia, oral phase (R13.11)    Follow Up Recommendations  Skilled Nursing facility    Frequency and Duration           SLP Evaluation Cognition  Overall Cognitive Status: No family/caregiver present to determine baseline cognitive functioning Arousal/Alertness: Awake/alert Orientation Level: Oriented X4 Attention: Focused Focused Attention: Appears intact Awareness: Impaired Awareness Impairment: Intellectual impairment       Comprehension  Auditory Comprehension Overall Auditory Comprehension: Appears within functional limits for tasks assessed(for basic yes/no and following commands)    Expression Expression Primary Mode of Expression: Verbal Verbal Expression Overall Verbal Expression: Impaired Initiation: Impaired Repetition: No impairment Naming: No impairment   Oral / Motor  Oral Motor/Sensory Function Overall Oral Motor/Sensory Function: Other (comment)(bilateral lower lip weakness/drooling; prior left facial asy) Motor Speech Overall Motor Speech: Impaired Articulation: Impaired Intelligibility: Intelligibility reduced Word: 75-100% accurate Phrase: 75-100% accurate   GO          Functional Assessment Tool Used: clinical judgment Functional Limitations: Motor speech Swallow Current Status [phone]): At least 20 percent but less than 40 percent impaired, limited or restricted Swallow Goal Status [phone]): At least 20 percent but less  than 40 percent impaired, limited or restricted Swallow Discharge Status [phone]): At least 20 percent but less than 40 percent impaired, limited or restricted Motor Speech Current Status [phone]): At least 20 percent but less than 40 percent impaired, limited or restricted Motor Speech Goal Status [phone]): At least 20 percent but less than 40 percent impaired, limited or restricted Motor Speech Goal Status [phone]): At least 20 percent but less than 40 percent impaired, limited or restricted         Juan Quam Laurice 08/05/2017, 2:47 PM

## 2017-08-05 NOTE — Progress Notes (Signed)
Patient arrived to floor. Alert and oriented X 4.  Follows commands.  Unable to use L side.  Skin is intact. Bilateral lower extremities dark, very large deep scaling. Condom catheter placed. Bed alarm on.

## 2017-08-05 NOTE — ED Triage Notes (Signed)
Pt brought to ED by [facility] form [facility] center for c/o left side facial droop, left arm weakness and slurred speech per Rehab center last seen well today at 0015 at 1 am pt wast found to have facial droop and weakness on his left side, pt normally able to walk using his walker, normally not able to elevate bilateral lower extremities. Pt AO x 4. VS  BP 130/88 HR 56 SPO2 98% RA CBG 154

## 2017-08-05 NOTE — Consult Note (Addendum)
Neurology Consultation  Reason for Consult: Code stroke Referring Physician: Dr Christy Gentles  CC: Left facial droop, left-sided weakness  History is obtained from: Patient, EMS  HPI: [name] is a 83 y.o. male who has a past medical history of atrial fibrillation not on anticoagulation because of GI bleed, pancytopenia/thrombocytopenia, colon cancer, chronic diastolic CHF, CKD 1, coronary artery disease, previous stroke with some left-sided weakness, depression who was brought in from his facility when he was noted to have left-sided facial droop, dysarthria.  According to the report, his last known normal was 12:15 AM and he was seen in the ER around 1:35 AM.  Upon further questioning the EMS, it was unclear whether 1215 and was his last known normal was or was that the time when he was seen and found to be not in his normal state of health.  He has had a history of right thalamic stroke with some left-sided deficits.  Upon initial assessment by EMS, his systolic blood pressures were in the 220s.  Blood pressures prior to EMS arrival by the facility were in the 180s. Patient does complain of slurred speech and possible left facial droop.  He feels weaker on the left but could not tell me whether his weakness is old or new.  LKW: 12:15 AM on 08/05/2017. tpa given?: no, mild stroke symptoms, low delta NIH, h/o GIB on anticoag Premorbid modified Rankin scale (mRS): 3-4   ROS: A 14 point ROS was performed and is negative except as noted in the HPI.  Past Medical History:  Diagnosis Date  . Atrial fibrillation ([place])   . Atrial flutter ([place])    hx/notes 11/28/2016  . CHF (congestive heart failure) ([place])   . Chronic diastolic CHF (congestive heart failure) ([place])    hx/notes 11/28/2016  . CKD (chronic kidney disease), stage IV ([place])    hx/notes 11/28/2016  . Colon cancer ([place])    hx/notes 11/28/2016  . Coronary artery disease   . Depression    hx/notes 11/28/2016  . Diabetic neuropathy ([place])     hx/notes 11/28/2016  . GI bleeding 11/27/2016   notes 11/28/2016  . History of blood transfusion 11/28/2016   hx/notes 11/28/2016  . Hypertension   . IDDM (insulin dependent diabetes mellitus) ([place])    uncontrolled; hx/notes 11/28/2016    Family History  Problem Relation Age of Onset  . Brain cancer Mother   . Diabetes type II Sister   . Narcolepsy Paternal Uncle     Social History:   reports that he quit smoking about 18 years ago. he has never used smokeless tobacco. He reports that he does not drink alcohol or use drugs.  Medications No current facility-administered medications for this encounter.   Current Outpatient Medications:  .  ARIPiprazole (ABILIFY) 5 MG tablet, Give 2.5 tablet by mouth one time a day, Disp: , Rfl:  .  atorvastatin (LIPITOR) 20 MG tablet, Take 1 tablet (20 mg total) by mouth daily at 6 PM., Disp: , Rfl:  .  ferrous sulfate 325 (65 FE) MG EC tablet, Take 1 tablet (325 mg total) by mouth 3 (three) times daily with meals., Disp: 90 tablet, Rfl: 0 .  fluticasone (FLONASE) 50 MCG/ACT nasal spray, Place 2 sprays into both nostrils daily., Disp: , Rfl:  .  furosemide (LASIX) 40 MG tablet, Take 3 tablets (120 mg total) by mouth 2 (two) times daily., Disp: 30 tablet, Rfl: 0 .  hydrALAZINE (APRESOLINE) 25 MG tablet, Take 3 tablets (75 mg total) by  mouth every 8 (eight) hours., Disp: 30 tablet, Rfl: 0 .  HYDROcodone-acetaminophen (NORCO/VICODIN) 5-325 MG tablet, Take 1 tablet by mouth every 6 (six) hours as needed for moderate pain., Disp: 120 tablet, Rfl: 0 .  Insulin Glargine (LANTUS SOLOSTAR) 100 UNIT/ML Solostar Pen, Inject 15 Units into the skin daily at 10 pm., Disp: 5 pen, Rfl: PRN .  pantoprazole (PROTONIX) 40 MG tablet, Take 1 tablet (40 mg total) by mouth daily at 6 (six) AM., Disp: 30 tablet, Rfl: 0 .  polyethylene glycol (MIRALAX / GLYCOLAX) packet, Take 17 g by mouth daily., Disp: , Rfl:  .  senna (SENOKOT) 8.6 MG TABS tablet, Take 2 tablets by mouth at  bedtime., Disp: , Rfl:    Exam: Current vital signs: BP (!) 169/77   Pulse 62   Temp 98.8 F (37.1 C) (Oral)   Resp 20   Ht 5\' 10"  (1.778 m)   Wt 88.4 kg (194 lb 14.2 oz)   SpO2 100%   BMI 27.96 kg/m  Vital signs in last 24 hours:   GENERAL: Awake, alert in NAD HEENT: - Normocephalic and atraumatic, dry mm, no LN++, no Thyromegally LUNGS - Clear to auscultation bilaterally with no wheezes CV - S1S2 RRR, no m/r/g, equal pulses bilaterally. ABDOMEN - Soft, nontender, nondistended with normoactive BS Ext: warm, chronic venous stasis changes seen with 1+ pitting edema.  NEURO:  Mental Status: AA&Ox3  Language: speech is dysarthric.  Naming, repetition, fluency, and comprehension intact. Cranial Nerves: PERRL. EOMI, visual fields full, obvious droop of the left angle of the mouth, facial sensation intact, hearing intact, tongue/uvula/soft palate midline, normal sternocleidomastoid and trapezius muscle strength. No evidence of tongue atrophy or fibrillation Motor: 5/5 right upper extremity.  2/5 right lower extremity.  4/5 left upper extremity.  2/5 left lower extremity.  Bradykinesia noted in both upper extremities. Tone: Examination of tone in the upper extremity shows cogwheeling and spontaneous resting tremor left greater than right.  Masked facies appearance on the face. Sensation-decreased sensation to  light touch on the left hemibody. Coordination: FTN intact bilaterally.  Unable to perform lower extremity coordination testing Gait- deferred  NIHSS 1a Level of Conscious.: 0 1b LOC Questions: 0 1c LOC Commands: 0 2 Best Gaze: 0 3 Visual: 0 4 Facial Palsy: 1 5a Motor Arm - left: 1 5b Motor Arm - Right: 0 6a Motor Leg - Left: 3 6b Motor Leg - Right: 3 7 Limb Ataxia: 0 8 Sensory: 1 9 Best Language: 0 10 Dysarthria: 1 11 Extinct. and Inatten.: 0 TOTAL: 10   Labs I have reviewed labs in epic and the results pertinent to this consultation are:  CBC    Component  Value Date/Time   WBC 3.4 12/05/2016   WBC 4.0 11/29/2016 0601   RBC 3.44 (L) 11/29/2016 0601   HGB 11.2 (L) 08/05/2017 0139   HGB 8.7 (L) 03/10/2014 1550   HCT 33.0 (L) 08/05/2017 0139   HCT 27.6 (L) 03/10/2014 1550   PLT 71 (A) 12/05/2016   PLT 103 Large platelets present (L) 03/10/2014 1550   MCV 75.0 (L) 11/29/2016 0601   MCV 82.9 03/10/2014 1550   MCH 23.3 (L) 11/29/2016 0601   MCHC 31.0 11/29/2016 0601   RDW 18.2 (H) 11/29/2016 0601   RDW 18.4 (H) 03/10/2014 1550   LYMPHSABS 0.9 11/27/2016 1736   LYMPHSABS 0.7 (L) 03/10/2014 1550   MONOABS 2.0 (H) 11/27/2016 1736   MONOABS 1.0 (H) 03/10/2014 1550   EOSABS 0.0 11/27/2016 1736  EOSABS 0.1 03/10/2014 1550   BASOSABS 0.0 11/27/2016 1736   BASOSABS 0.0 03/10/2014 1550    CMP     Component Value Date/Time   NA 140 08/05/2017 0139   NA 141 12/05/2016   K 6.0 (H) 08/05/2017 0139   CL 110 08/05/2017 0139   CO2 24 11/29/2016 0601   GLUCOSE 128 (H) 08/05/2017 0139   BUN 35 (H) 08/05/2017 0139   BUN 49 (A) 12/05/2016   CREATININE 2.80 (H) 08/05/2017 0139   CREATININE 1.56 (H) 10/07/2013 1141   CALCIUM 9.0 11/29/2016 0601   PROT 6.7 11/27/2016 1736   ALBUMIN 3.6 11/27/2016 1736   AST 6 (A) 12/05/2016   ALT 5 (A) 12/05/2016   ALKPHOS 52 12/05/2016   BILITOT 0.5 11/27/2016 1736   GFRNONAA 18 (L) 11/29/2016 0601   GFRAA 21 (L) 11/29/2016 0601    Lipid Panel     Component Value Date/Time   CHOL 104 01/31/2017   TRIG 64 01/31/2017   HDL 40 01/31/2017   CHOLHDL 5.5 12/09/2015 0717   VLDL 17 12/09/2015 0717   LDLCALC 51 01/31/2017     Imaging I have reviewed the images obtained: CT-scan of the brain - ASPECTS 10. Chronic WM changes  Assessment:  83 year old man with multiple cerebrovascular risk factors and a history of old right thalamic stroke with residual left-sided weakness presents for acute onset of left facial droop, dysarthria and left-sided weakness.  On examination, he does have subtle left lower  facial weakness, dysarthria, resting tremor in both his upper extremities left greater than right, bradykinesia and masked facies.  NIH stroke scale is 10 but I think the change from baseline is only 2-3 points. Given the low severity of the new symptoms, h/o Gi bleed on anticoagulation and history of thrombocytopenia with last platelet count of 71,000, even though he was within the 4-1/2 hours for IV TPA, I decided not to use IV TPA as risks outweighed benefits at this time. No signs of large vessel occlusion on exam hence not a candidate for endovascular procedures.  His current presentation likely represents recrudescence of old symptoms in the setting of hypertension versus new small vessel or embolic stroke. Symptoms could also be related to hypertensive urgency as his systolic blood pressures were in 220s.  Other considerations for his dysarthria and parkinsonian symptoms on exam could be his antidepressant/antipsychotics.  Impression Evaluate for new embolic stroke Evaluate for recrudescence of old stroke symptoms Evaluate for hypertensive urgency Evaluate for drug-induced parkinsonism-especially Abilify related. Thrombocytopenia  Recommendations: -Admit to hospitalist  -Telemetry monitoring -Allow for permissive hypertension for the first 24-48h - only treat PRN if SBP >220 mmHg. Blood pressures can be gradually normalized to SBP<140 upon discharge. -MRI, MRA  of the brain without contrast -Echocardiogram -HgbA1c, fasting lipid panel -Frequent neuro checks -Prophylactic therapy-Antiplatelet med: Aspirin - dose 325mg  PO or 300mg  PR -Atorvastatin 80 mg PO daily -Risk factor modification -PT consult, OT consult, Speech consult -Consider discontinuing Abilify  Please page stroke NP/PA/MD (listed on AMION)  from 8am-4 pm as this patient will be followed by the stroke team at this point.  Amie [place], MD [facility] [phone] If 7pm to 7am, please call on call as  listed on AMION.

## 2017-08-05 NOTE — Progress Notes (Signed)
[name] is a 83 y.o. Male presents to ED via [facility] with acute onset of Left side weakness, Left side facial droop, and slurred speech. Staff reports to EMS pt normally is unable to move BLE. Pt with a hx of Afib, A-flutter, CHF, CKD, Colon CA, GI Bleed, HTN, DM, diabetic neuropathy. LKW 0015, NIH 17, CBG 123 Admit pt to [facility] for further evaluation.

## 2017-08-05 NOTE — Evaluation (Signed)
Physical Therapy Evaluation Patient Details Name: [name] MRN: [id number] DOB: 04/15/1934 Today's Date: 08/05/2017   History of Present Illness  83 y.o. male with medical history significant of A flutter and atrial fibrillation not on anticoagulation because of GI bleed, pancytopenia/thrombocytopenia, colon cancer, chronic diastolic CHF, CKD-IV, coronary artery disease, previous stroke with some left-sided weakness, depression, who presents with new left-sided facial droop and slurred speech.; MRI shows Progression of chronic microvascular ischemic changes with new small chronic infarctions in bilateral cerebellar hemisphere and L pons, no other acute changes  Clinical Impression  Patient presents with left sided weakness (not sure how new), dysarthria, impaired sitting/standing balance and impaired mobility s/p above. Tolerated standing bout with assist of 2 and squat pivot transfer to chair with assist of 2. Pt seems fearful of falling with posterior bias. Also with intention tremors and rigidity. Pt from SNF and is mostly dependent for ADls but uses RW for ambulation PTA per pt report- not sure of accuracy. Will follow acutely to maximize independence and mobility prior to return to SNF.     Follow Up Recommendations SNF    Equipment Recommendations  None recommended by PT    Recommendations for Other Services       Precautions / Restrictions Precautions Precautions: Fall Restrictions Weight Bearing Restrictions: No      Mobility  Bed Mobility Overal bed mobility: Needs Assistance Bed Mobility: Rolling;Sidelying to Sit Rolling: Min assist Sidelying to sit: Max assist;HOB elevated       General bed mobility comments: Step by step cues to move BLEs to EOB with assist; assist with scooting bottom and to elevate trunk to get to EOB. Posterior bias.   Transfers Overall transfer level: Needs assistance Equipment used: Rolling walker (2 wheeled);2 person hand held  assist Transfers: Sit to/from [employer] Sit to Stand: Mod assist;+2 physical assistance;From elevated surface   Squat pivot transfers: +2 physical assistance;Mod assist     General transfer comment: Able to stand with assist of 2, not able to extend BLEs and heavy posteror bias in standing through hips despite manual and verbal cues for upright. Seems fearful of falling. Squat pivot transfer to chair towards right side with Mod A of 2.   Ambulation/Gait             General Gait Details: Deferred  Stairs            Wheelchair Mobility    Modified Rankin (Stroke Patients Only) Modified Rankin (Stroke Patients Only) Pre-Morbid Rankin Score: Moderately severe disability Modified Rankin: Severe disability     Balance Overall balance assessment: Needs assistance Sitting-balance support: Feet supported;No upper extremity supported Sitting balance-Leahy Scale: Poor Sitting balance - Comments: Pt with posterior lean and bias sitting EOB, able to initiate forward lean and abdominals but fatigues and returns to posterior lean or when distracted.  Postural control: Posterior lean Standing balance support: During functional activity Standing balance-Leahy Scale: Zero Standing balance comment: Requires BUE support and Max A of 2 for standing.                              Pertinent Vitals/Pain Pain Assessment: Faces Faces Pain Scale: Hurts little more Pain Location: BLEs with movement Pain Descriptors / Indicators: Aching;Sore Pain Intervention(s): Monitored during session;Repositioned    Home Living Family/patient expects to be discharged to:: Skilled nursing facility  Additional Comments: [facility] from [facility]    Prior Function Level of Independence: Needs assistance   Gait / Transfers Assistance Needed: Using Rw PTA for ambulation.   ADL's / Homemaking Assistance Needed: Assist for bathing, dressing  Comments:  pt states he lives alone     Hand Dominance   Dominant Hand: Right    Extremity/Trunk Assessment   Upper Extremity Assessment Upper Extremity Assessment: Defer to OT evaluation(BUE intention tremor noted)    Lower Extremity Assessment Lower Extremity Assessment: Generalized weakness(Grossly ~3/5 BLEs, slow to move, very rigid)    Cervical / Trunk Assessment Cervical / Trunk Assessment: Kyphotic  Communication   Communication: Expressive difficulties(dysarthria)  Cognition Arousal/Alertness: Awake/alert Behavior During Therapy: WFL for tasks assessed/performed Overall Cognitive Status: Difficult to assess Area of Impairment: Attention;Problem solving;Following commands                   Current Attention Level: Selective   Following Commands: Follows one step commands with increased time     Problem Solving: Slow processing;Decreased initiation;Difficulty sequencing;Requires verbal cues;Requires tactile cues General Comments: Knows he is at [facility] and may have had a second stroke. Not able to state year but knows it is December. Tangential speech at times. Difficult to understand at times due to dysarthria.       General Comments General comments (skin integrity, edema, etc.): Pt in A-fib.     Exercises     Assessment/Plan    PT Assessment Patient needs continued PT services  PT Problem List Decreased strength;Decreased mobility;Decreased range of motion;Decreased activity tolerance;Decreased cognition;Cardiopulmonary status limiting activity;Decreased balance;Pain       PT Treatment Interventions Functional mobility training;Balance training;Gait training;Therapeutic activities;Neuromuscular re-education;Wheelchair mobility training;Therapeutic exercise;Cognitive remediation;Patient/family education;DME instruction    PT Goals (Current goals can be found in the Care Plan section)  Acute Rehab PT Goals Patient Stated Goal: to get stronger PT Goal Formulation:  With patient Time For Goal Achievement: 08/19/17 Potential to Achieve Goals: Good    Frequency Min 3X/week   Barriers to discharge        Co-evaluation PT/OT/SLP Co-Evaluation/Treatment: Yes Reason for Co-Treatment: Complexity of the patient's impairments (multi-system involvement);For patient/therapist safety;To address functional/ADL transfers PT goals addressed during session: Mobility/safety with mobility;Balance OT goals addressed during session: ADL's and self-care       AM-PAC PT "6 Clicks" Daily Activity  Outcome Measure Difficulty turning over in bed (including adjusting bedclothes, sheets and blankets)?: Unable Difficulty moving from lying on back to sitting on the side of the bed? : Unable Difficulty sitting down on and standing up from a chair with arms (e.g., wheelchair, bedside commode, etc,.)?: Unable Help needed moving to and from a bed to chair (including a wheelchair)?: A Lot Help needed walking in hospital room?: Total Help needed climbing 3-5 steps with a railing? : Total 6 Click Score: 7    End of Session Equipment Utilized During Treatment: Gait belt Activity Tolerance: Patient tolerated treatment well Patient left: in chair;with call bell/phone within reach(no chair alarm pads left) Nurse Communication: Mobility status;Need for lift equipment PT Visit Diagnosis: Muscle weakness (generalized) (M62.81);Unsteadiness on feet (R26.81);Difficulty in walking, not elsewhere classified (R26.2)    Time: 7619-5093 PT Time Calculation (min) (ACUTE ONLY): 35 min   Charges:   PT Evaluation $PT Eval Moderate Complexity: 1 Mod     PT G CodesWray Kearns, PT, DPT [phone]    Marguarite Arbour A Vivian Neuwirth 08/05/2017, 10:06 AM

## 2017-08-05 NOTE — Progress Notes (Signed)
Blood pressure 246/68 hydralazine 5Mg  IV given. Patient complaining of bad headache. Report given to oncoming nurse.

## 2017-08-05 NOTE — Progress Notes (Signed)
Received phone report from Vicente Males, ED RN. Patient coming from [facility]. LSW 12:15 am. Currently, patient in MRI. Awaiting arrival.

## 2017-08-05 NOTE — Evaluation (Signed)
Occupational Therapy Evaluation Patient Details Name: [name] MRN: [id number] DOB: 02/06/1934 Today's Date: 08/05/2017    History of Present Illness 83 y.o. male with medical history significant of A flutter and atrial fibrillation not on anticoagulation because of GI bleed, pancytopenia/thrombocytopenia, colon cancer, chronic diastolic CHF, CKD-IV, coronary artery disease, previous stroke with some left-sided weakness, depression, who presents with new left-sided facial droop and slurred speech.; MRI shows Progression of chronic microvascular ischemic changes with new small chronic infarctions in bilateral cerebellar hemisphere and L pons, no other acute changes   Clinical Impression   This 83 y/o M presents with the above. Pt resides in SNF, at baseline was using RW for mobility and receiving assist for bathing/dressing ADLs. Pt currently requires mod-maxA +2 for bed mobility, sit<>stand and squat pivot to recliner, requires MaxA +2 for LB ADLs, ModA for UB ADLs secondary to LUE functional impairments (partly residual from previous CVA). Pt will benefit from continued acute OT services and recommend additional OT services in SNF setting after discharge to maximize Pt's safety and independence with ADLs and mobility.     Follow Up Recommendations  SNF;Supervision/Assistance - 24 hour    Equipment Recommendations  Other (comment)(defer to next venue )           Precautions / Restrictions Precautions Precautions: Fall Restrictions Weight Bearing Restrictions: No      Mobility Bed Mobility Overal bed mobility: Needs Assistance Bed Mobility: Rolling;Sidelying to Sit Rolling: Min assist Sidelying to sit: Max assist;HOB elevated       General bed mobility comments: Step by step cues to move BLEs to EOB with assist; assist with scooting bottom and to elevate trunk to get to EOB. Posterior bias.   Transfers Overall transfer level: Needs assistance Equipment used: Rolling  walker (2 wheeled);2 person hand held assist Transfers: Sit to/from [employer] Sit to Stand: Mod assist;+2 physical assistance;From elevated surface   Squat pivot transfers: +2 physical assistance;Mod assist     General transfer comment: Able to stand with assist of 2, not able to extend BLEs and heavy posteror bias in standing through hips despite manual and verbal cues for upright. Seems fearful of falling. Squat pivot transfer to chair towards right side with Mod A of 2.     Balance Overall balance assessment: Needs assistance Sitting-balance support: Feet supported;No upper extremity supported Sitting balance-Leahy Scale: Poor Sitting balance - Comments: Pt with posterior lean and bias sitting EOB, able to initiate forward lean and abdominals but fatigues and returns to posterior lean or when distracted.  Postural control: Posterior lean Standing balance support: During functional activity Standing balance-Leahy Scale: Zero Standing balance comment: Requires BUE support and Max A of 2 for standing.                            ADL either performed or assessed with clinical judgement   ADL Overall ADL's : Needs assistance/impaired Eating/Feeding: Minimal assistance;Sitting;Set up   Grooming: Min guard;Set up;Sitting;Wash/dry face Grooming Details (indicate cue type and reason): Pt required repetition/verbal cues to wash face while seated EOB Upper Body Bathing: Moderate assistance;Sitting   Lower Body Bathing: Maximal assistance;+2 for physical assistance;+2 for safety/equipment;Sit to/from stand;Sitting/lateral leans   Upper Body Dressing : Moderate assistance;Sitting Upper Body Dressing Details (indicate cue type and reason): donning second gown  Lower Body Dressing: +2 for physical assistance;+2 for safety/equipment;Sitting/lateral leans;Sit to/from stand;Maximal assistance Lower Body Dressing Details (indicate cue type and reason): total  assist to  don socks at bed level; Pt reports he can't reach his feet to complete task  Toilet Transfer: +2 for safety/equipment;With caregiver independent assisting;Squat-pivot;BSC;Requires drop arm;Moderate assistance Toilet Transfer Details (indicate cue type and reason): simulated in transfer to drop arm recliner  Toileting- Clothing Manipulation and Hygiene: +2 for physical assistance;+2 for safety/equipment;Sit to/from stand;Sitting/lateral lean;Maximal assistance       Functional mobility during ADLs: +2 for physical assistance;+2 for safety/equipment;Moderate assistance General ADL Comments: Pt completes bed mobility, sits EOB approx 5 min during blood draw with minguard-intermittent MinA due to posterior lean; completed squat pivot to recliner with ModA +2      Vision Baseline Vision/History: Wears glasses Patient Visual Report: No change from baseline Vision Assessment?: Yes Ocular Range of Motion: Within Functional Limits Additional Comments: informal visual assessment completed; Pt able to read time on clock within room; able to track towards all quadrants with increased time/repetition of instruction                 Pertinent Vitals/Pain Pain Assessment: Faces Faces Pain Scale: Hurts little more Pain Location: BLEs with movement Pain Descriptors / Indicators: Aching;Sore Pain Intervention(s): Monitored during session;Repositioned     Hand Dominance Right   Extremity/Trunk Assessment Upper Extremity Assessment Upper Extremity Assessment: LUE deficits/detail;RUE deficits/detail RUE Deficits / Details: bil UE tremors noted; shoulder flexion grossly 3-/5, slow movements, decreased grip strength RUE Coordination: decreased fine motor LUE Deficits / Details: bil UE tremors noted; all movements grossly 2-/5   Lower Extremity Assessment Lower Extremity Assessment: Defer to PT evaluation   Cervical / Trunk Assessment Cervical / Trunk Assessment: Kyphotic   Communication  Communication Communication: Expressive difficulties(dysarthria)   Cognition Arousal/Alertness: Awake/alert Behavior During Therapy: WFL for tasks assessed/performed Overall Cognitive Status: Difficult to assess Area of Impairment: Attention;Problem solving;Following commands                   Current Attention Level: Selective   Following Commands: Follows one step commands with increased time     Problem Solving: Slow processing;Decreased initiation;Difficulty sequencing;Requires verbal cues;Requires tactile cues General Comments: Knows he is at [facility] and may have had a second stroke. Not able to state year but knows it is December. Tangential speech at times. Difficult to understand at times due to dysarthria.    General Comments  Pt in A-fib.                Home Living Family/patient expects to be discharged to:: Skilled nursing facility                                 Additional Comments: Pt from [facility]      Prior Functioning/Environment Level of Independence: Needs assistance  Gait / Transfers Assistance Needed: Using Rw PTA for ambulation.  ADL's / Homemaking Assistance Needed: Assist for bathing, dressing   Comments: pt states he lives alone        OT Problem List: Decreased strength;Impaired balance (sitting and/or standing);Decreased cognition;Decreased range of motion;Decreased safety awareness;Decreased activity tolerance;Decreased knowledge of use of DME or AE            OT Goals(Current goals can be found in the care plan section) Acute Rehab OT Goals Patient Stated Goal: to get stronger OT Goal Formulation: With patient Time For Goal Achievement: 08/19/17 Potential to Achieve Goals: Good  OT Frequency: Min 2X/week  Co-evaluation PT/OT/SLP Co-Evaluation/Treatment: Yes Reason for Co-Treatment: Complexity of the patient's impairments (multi-system involvement);For patient/therapist safety;To address  functional/ADL transfers PT goals addressed during session: Mobility/safety with mobility;Balance OT goals addressed during session: ADL's and self-care      AM-PAC PT "6 Clicks" Daily Activity     Outcome Measure Help from another person eating meals?: A Little Help from another person taking care of personal grooming?: A Little Help from another person toileting, which includes using toliet, bedpan, or urinal?: A Lot Help from another person bathing (including washing, rinsing, drying)?: A Lot Help from another person to put on and taking off regular upper body clothing?: A Little Help from another person to put on and taking off regular lower body clothing?: A Lot 6 Click Score: 15   End of Session Equipment Utilized During Treatment: Gait belt;Rolling walker Nurse Communication: Mobility status  Activity Tolerance: Patient tolerated treatment well Patient left: in chair;with call bell/phone within reach  OT Visit Diagnosis: Unsteadiness on feet (R26.81);Other abnormalities of gait and mobility (R26.89);Muscle weakness (generalized) (M62.81);Hemiplegia and hemiparesis Hemiplegia - Right/Left: Left Hemiplegia - dominant/non-dominant: Non-Dominant Hemiplegia - caused by: Cerebral infarction                Time: 4540-9811 OT Time Calculation (min): 36 min Charges:  OT General Charges $OT Visit: 1 Visit OT Evaluation $OT Eval Moderate Complexity: 1 Mod G-Codes:     Lou Cal, OT Pager [phone] 08/05/2017   Raymondo Band 08/05/2017, 10:20 AM

## 2017-08-05 NOTE — H&P (Addendum)
History and Physical    [name] EYC:[id number] DOB: Sep 30, 1933 DOA: 08/05/2017  Referring MD/NP/PA:   PCP: [name], PA   Patient coming from:  The patient is coming from SNF.  At baseline, pt is dependent for most of ADL.      Chief Complaint:   HPI: [name] is a 83 y.o. male with medical history significant of A flutter and atrial fibrillation not on anticoagulation because of GI bleed, pancytopenia/thrombocytopenia, colon cancer, chronic diastolic CHF, CKD-IV, coronary artery disease, previous stroke with some left-sided weakness, depression, who presents with new left-sided facial droop and slurred speech.  Per report, pt was LNK at 00:15, and found to have new left-sided facial droop and slurred speech at about 1:00 AM. He has chronic left-weakness from previous stroke. No vision change or hearing loss. Pt denies chest pain, shortness breath, cough, fever or chills. No nausea, vomiting, diarrhea or abdominal pain. Denies symptoms of UTI.   ED Course: pt was found to have WBC 7.3, INR 1.012, negative troponin, stable renal function, potassium 6.0, temperature normal, bradycardia, oxygen saturation 100% on room air. CT head is negative for acute intracranial abnormalities. Patient was treated with NovoLog, D50 and IV Lasix for hyperkalemia in ED. Pt was found to have bradykinesia and masked facies on physical examination. Patient is admitted to telemetry bed as inpatient. Neurology, Dr. Rory Percy was consulted.  Review of Systems:   General: no fevers, chills, no body weight gain, has fatigue HEENT: no blurry vision, hearing changes or sore throat Respiratory: no dyspnea, coughing, wheezing CV: no chest pain, no palpitations GI: no nausea, vomiting, abdominal pain, diarrhea, constipation GU: no dysuria, burning on urination, increased urinary frequency, hematuria  Ext: has trace leg edema Neuro: No vision change or hearing loss. Has left sided weakness, left facial  droop or slurred speech Skin: no rash, no skin tear. MSK: No muscle spasm, no deformity, no limitation of range of movement in spin Heme: No easy bruising.  Travel history: No recent long distant travel.  Allergy:  Allergies  Allergen Reactions  . Metformin And Related Other (See Comments)    Reaction: pt states he thinks he threw up    Past Medical History:  Diagnosis Date  . Atrial fibrillation ([place])   . Atrial flutter ([place])    hx/notes 11/28/2016  . CHF (congestive heart failure) ([place])   . Chronic diastolic CHF (congestive heart failure) ([place])    hx/notes 11/28/2016  . CKD (chronic kidney disease), stage IV ([place])    hx/notes 11/28/2016  . Colon cancer ([place])    hx/notes 11/28/2016  . Coronary artery disease   . Depression    hx/notes 11/28/2016  . Diabetic neuropathy ([place])    hx/notes 11/28/2016  . GI bleeding 11/27/2016   notes 11/28/2016  . History of blood transfusion 11/28/2016   hx/notes 11/28/2016  . Hypertension   . IDDM (insulin dependent diabetes mellitus) ([place])    uncontrolled; hx/notes 11/28/2016    Past Surgical History:  Procedure Laterality Date  . COLONOSCOPY  06/21/2012   Procedure: COLONOSCOPY;  Surgeon: Juanita Craver, MD;  Location: [facility] ENDOSCOPY;  Service: Endoscopy;  Laterality: N/A;  . COLONOSCOPY Left 02/13/2014   Procedure: COLONOSCOPY;  Surgeon: Juanita Craver, MD;  Location: [facility] ENDOSCOPY;  Service: Endoscopy;  Laterality: Left;  . ESOPHAGOGASTRODUODENOSCOPY  06/19/2012   Procedure: ESOPHAGOGASTRODUODENOSCOPY (EGD);  Surgeon: Beryle Beams, MD;  Location: [facility] ENDOSCOPY;  Service: Endoscopy;  Laterality: N/A;  . LAPAROSCOPIC RIGHT COLECTOMY Right 02/15/2014   Procedure:  LAPAROSCOPIC ASISSTED RIGHT  COLECTOMY, POSSIBLE OPEN;  Surgeon: Rolm Bookbinder, MD;  Location: [place];  Service: General;  Laterality: Right;  . LEG SURGERY     Left leg surgery. broken leg    Social History:  reports that he quit smoking about 18 years ago. he has never used smokeless tobacco.  He reports that he does not drink alcohol or use drugs.  Family History:  Family History  Problem Relation Age of Onset  . Brain cancer Mother   . Diabetes type II Sister   . Narcolepsy Paternal Uncle      Prior to Admission medications   Medication Sig Start Date End Date Taking? Authorizing Provider  Ammonium Lactate 70 % SOLN Apply 1 application topically 2 (two) times daily. Apply to BLE   Yes [provider]  ARIPiprazole (ABILIFY) 5 MG tablet Take 5 mg by mouth daily.    Yes [provider]  atorvastatin (LIPITOR) 20 MG tablet Take 1 tablet (20 mg total) by mouth daily at 6 PM. 12/12/15  Yes Rai, Ripudeep K, MD  benzonatate (TESSALON) 100 MG capsule Take 100 mg by mouth every 6 (six) hours as needed for cough.   Yes [provider]  cloNIDine (CATAPRES - DOSED IN MG/24 HR) 0.1 mg/24hr patch Place 0.1 mg onto the skin once a week. Saturday   Yes [provider]  ferrous sulfate 325 (65 FE) MG EC tablet Take 1 tablet (325 mg total) by mouth 3 (three) times daily with meals. 11/29/16  Yes Short, Noah Delaine, MD  fluticasone (FLONASE) 50 MCG/ACT nasal spray Place 2 sprays into both nostrils daily.   Yes [provider]  hydrALAZINE (APRESOLINE) 25 MG tablet Take 3 tablets (75 mg total) by mouth every 8 (eight) hours. 09/18/16  Yes Eber Jones, MD  HYDROcodone-acetaminophen (NORCO/VICODIN) 5-325 MG tablet Take 1 tablet by mouth every 6 (six) hours as needed for moderate pain. 12/02/16  Yes Reed, Tiffany L, DO  Insulin Glargine (LANTUS SOLOSTAR) 100 UNIT/ML Solostar Pen Inject 15 Units into the skin daily at 10 pm. 12/04/16  Yes Hendricks Limes, MD  pantoprazole (PROTONIX) 40 MG tablet Take 1 tablet (40 mg total) by mouth daily at 6 (six) AM. 11/30/16  Yes Short, Noah Delaine, MD  risperiDONE (RISPERDAL) 1 MG/ML oral solution Take 1 mg by mouth at bedtime.   Yes [provider]  senna (SENOKOT) 8.6 MG TABS tablet Take 2 tablets by mouth  every 8 (eight) hours as needed for mild constipation.    Yes [provider]    Physical Exam: Vitals:   08/05/17 0315 08/05/17 0330 08/05/17 0345 08/05/17 0400  BP: (!) 169/77 (!) 169/48 (!) 151/33 (!) 167/44  Pulse: 62 (!) 58 (!) 53 (!) 54  Resp: 20 18 16 15   Temp:      TempSrc:      SpO2: 100% 100% 100% 100%  Weight:      Height:       General: Not in acute distress HEENT:       Eyes: PERRL, EOMI, no scleral icterus.       ENT: No discharge from the ears and nose, no pharynx injection, no tonsillar enlargement.        Neck: No JVD, no bruit, no mass felt. Heme: No neck lymph node enlargement. Cardiac: S1/S2, RRR, No murmurs, No gallops or rubs. Respiratory: No rales, wheezing, rhonchi or rubs. GI: Soft, nondistended, nontender, no rebound pain, no organomegaly, BS present. GU: No hematuria  Ext: has trace leg edema bilaterally. Has venous insufficiency change in legs. 2+DP/PT pulse bilaterally. Musculoskeletal: No joint deformities, No joint redness or warmth, no limitation of ROM in spin. Skin: No rashes.  Neuro: Alert, oriented X3. Muscle strength 1/5 in left arm and 2/5 in left leg, 4/5 in the right leg and 5/5 in the right arm. Brachial reflex 2+ bilaterally. Negative Babinski's sign. Has bradykinesia and masked facies.   Psych: Patient is not psychotic, no suicidal or hemocidal ideation.  Labs on Admission: I have personally reviewed following labs and imaging studies  CBC: Recent Labs  Lab 08/05/17 0127 08/05/17 0139  WBC 7.3  --   NEUTROABS 3.4  --   HGB 11.3* 11.2*  HCT 34.6* 33.0*  MCV 88.3  --   PLT 170  --    Basic Metabolic Panel: Recent Labs  Lab 08/05/17 0127 08/05/17 0139  NA 135 140  K 5.9* 6.0*  CL 108 110  CO2 21*  --   GLUCOSE 128* 128*  BUN 39* 35*  CREATININE 2.80* 2.80*  CALCIUM 8.8*  --    GFR: Estimated Creatinine Clearance: 23.2 mL/min (A) (by C-G formula based on SCr of 2.8 mg/dL (H)). Liver Function Tests: Recent  Labs  Lab 08/05/17 0127  AST 8*  ALT 6*  ALKPHOS 71  BILITOT 0.6  PROT 7.3  ALBUMIN 3.6   No results for input(s): LIPASE, AMYLASE in the last 168 hours. No results for input(s): AMMONIA in the last 168 hours. Coagulation Profile: Recent Labs  Lab 08/05/17 0127  INR 1.12   Cardiac Enzymes: No results for input(s): CKTOTAL, CKMB, CKMBINDEX, TROPONINI in the last 168 hours. BNP (last 3 results) No results for input(s): PROBNP in the last 8760 hours. HbA1C: No results for input(s): HGBA1C in the last 72 hours. CBG: Recent Labs  Lab 08/05/17 0129  GLUCAP 123*   Lipid Profile: No results for input(s): CHOL, HDL, LDLCALC, TRIG, CHOLHDL, LDLDIRECT in the last 72 hours. Thyroid Function Tests: No results for input(s): TSH, T4TOTAL, FREET4, T3FREE, THYROIDAB in the last 72 hours. Anemia Panel: No results for input(s): VITAMINB12, FOLATE, FERRITIN, TIBC, IRON, RETICCTPCT in the last 72 hours. Urine analysis:    Component Value Date/Time   COLORURINE YELLOW 11/05/2016 2136   APPEARANCEUR CLEAR 11/05/2016 2136   LABSPEC 1.010 11/05/2016 2136   PHURINE 5.0 11/05/2016 2136   GLUCOSEU NEGATIVE 11/05/2016 2136   HGBUR NEGATIVE 11/05/2016 2136   BILIRUBINUR NEGATIVE 11/05/2016 2136   KETONESUR NEGATIVE 11/05/2016 2136   PROTEINUR 30 (A) 11/05/2016 2136   UROBILINOGEN 1.0 05/18/2015 1407   NITRITE NEGATIVE 11/05/2016 2136   LEUKOCYTESUR NEGATIVE 11/05/2016 2136   Sepsis Labs: @LABRCNTIP (procalcitonin:4,lacticidven:4) )No results found for this or any previous visit (from the past 240 hour(s)).   Radiological Exams on Admission: Ct Head Code Stroke Wo Contrast  Result Date: 08/05/2017 CLINICAL DATA:  Code stroke. 83 y/o M; left-sided deficits and slurred speech. EXAM: CT HEAD WITHOUT CONTRAST TECHNIQUE: Contiguous axial images were obtained from the base of the skull through the vertex without intravenous contrast. COMPARISON:  12/07/2016 CT head.  12/07/2016 MRI head.  FINDINGS: Brain: No evidence of acute infarction, hemorrhage, hydrocephalus, extra-axial collection or mass lesion/mass effect. Stable very small chronic infarction within left cerebellar hemisphere in chronic lacunar infarction within right thalamus. Stable mild chronic microvascular ischemic changes and parenchymal volume loss of the brain for age. Vascular: Calcific atherosclerosis of carotid siphons. No hyperdense vessel identified. Skull: Normal. Negative for fracture or focal lesion.  Sinuses/Orbits: No acute finding. Other: None. ASPECTS [facility] Stroke Program Early CT Score) - Ganglionic level infarction (caudate, lentiform nuclei, internal capsule, insula, M1-M3 cortex): 7 - Supraganglionic infarction (M4-M6 cortex): 3 Total score (0-10 with 10 being normal): 10 IMPRESSION: 1. No acute intracranial abnormality identified. 2. ASPECTS is 10 3. Stable mild for age chronic microvascular ischemic changes and mild parenchymal volume loss of the brain. Stable small chronic lacunar infarct of right thalamus and very small chronic infarction of left cerebellar hemisphere. These results were communicated to Dr. Rory Percy at 1:50 amon 12/11/2018by text page via the [facility] messaging system. Electronically Signed   By: Kristine Garbe M.D.   On: 08/05/2017 01:50     EKG: Independently reviewed. Atrial flutter, QTC 437, LAD, poor R-wave progression  Assessment/Plan Principal Problem:   Stroke (cerebrum) (HCC) Active Problems:   Chronic diastolic CHF (congestive heart failure) (HCC)   PAF (paroxysmal atrial fibrillation) (HCC)   Atrial flutter (HCC)   HTN (hypertension)   GI bleeding   CKD (chronic kidney disease), stage IV (HCC)   Iron deficiency anemia due to chronic blood loss   Hyperkalemia   Type II diabetes mellitus with renal manifestations (HCC)   GERD (gastroesophageal reflux disease)   Depression   CAD (coronary artery disease)   Possible stroke vs. TIA: The patient has history of  left-sided weakness due to stroke, now he has new slurred speech and facial droop, concerning for new stroke. CT-head negative. Dr. Rory Percy of neurology was consulted, recommended to do stroke workup.  - will admit to tele bed as inpt - will follow up Neurology's Recs as follows:  -Telemetry monitoring  -Allow for permissive hypertension for the first 24-48h - only treat PRN if SBP >220 mmHg. Blood pressures can be gradually normalized to SBP<140 upon discharge.  -MRI, MRA  of the brain without contrast  -Echocardiogram  -HgbA1c, fasting lipid panel  -Frequent neuro checks  -Prophylactic therapy-Antiplatelet med: Aspirin - dose 325mg  PO or 300mg  PR  -Atorvastatin 80 mg PO daily  -Risk factor modification  -PT consult, OT consult, Speech consult  -Consider discontinuing Abilify (due to bradykinesia and masked facies).    Chronic diastolic CHF (congestive heart failure) ([place]): 2-D echo 09/11/16 showed EF 60-65%. Patient has trace leg edema, but no JVD. No respiratory distress. CHF seems to be compensated. Patient is not taking diuretics at home. -Check BNP  PAF and Atrial flutter: CHA2DS2-VASc Score is 7, needs oral anticoagulation, but pt is not a good candidate for [facility] due to history of GI bleeding. Heart rate is well controlled. -tele monitoring  HTN: -hold clonidine and oral hydralazine to allow for permissive hypertension -When necessary IV hydralazine for SBP>220  Hx of GI bleeding and gerd: -will increase protonix from 40 daily to bid since ASA is started -check FOBT  CKD (chronic kidney disease), stage IV ([place]): stable. Baseline creatinine 2.8-3.2. His creatinine to 2.8, BUN 35. -Follow up renal function by BMP  Iron deficiency anemia due to chronic blood loss: -Continue iron supplement  Hyperkalemia: K=6.0, no EKG change -pt was treated with NovoLog, D50 and IV Lasix for hyperkalemia in ED. -will give Kayexalate 30 g 1   Type II diabetes mellitus with renal  manifestations with CKD-IV: Last A1c 6.1 on 12/05/16, well controled. Patient is taking Lantus at home -will decrease Lantus dose from 15 to 10 unit daily  -SSI  Depression: -hold Abilify as recommended by neurologist  Hx of CAD (coronary artery disease): no CP. -continue lipitor -newly  started ASA    DVT ppx: SQ Heparin          Code Status: Full code Family Communication: None at bed side. \ Disposition Plan:  Anticipate discharge back to previous SNF Consults called:  Neurology, Dr. Rory Percy Admission status:   Inpatient/tele      Date of Service 08/05/2017    Ivor Costa [facility] Pager [phone]  If 7PM-7AM, please contact night-coverage [email or web address] Password [facility] 08/05/2017, 4:24 AM

## 2017-08-05 NOTE — Care Management Obs Status (Signed)
[place] NOTIFICATION   Patient Details  Name: [name] MRN: [id number] Date of Birth: 1933/11/02   Medicare Observation Status Notification Given:  No  Could not obtain signature from patient, contact numbers in chart not working  [employer], [place] 08/05/2017, 2:27 PM

## 2017-08-05 NOTE — Progress Notes (Signed)
The patient was admitted early this AM after midnight and H and P has been reviewed and I am in agreement with the Assessment and Plan done by Dr. Ivor Costa. Additional changes to the plan of care have been made accordingly. The patient is an 83 year old [name] with a PMH of A Flutter/Atrial Fibrillation not on AC due to GIB, Pancytopenia/Thrombocytopenia, Colon Cancer, Chronic Diastolic CHF, CKD Stage 4, CAD, Depression, Hx of prior CVA and who presented with Left-Sided Facial Droop and Weakness along with slurred speech. Neurology was consulted and evaluated and patient underwent CVA workup. MRI done and showed no acute intracranial abnormality identified but there was progression of chronic microvascular ischemic changes with new small chronic infarctions in bilateral hemisphere and left Pons. Neurology felt that patient had a TIA vs. Worsening of old deficits in the setting of possible infection so they recommended evaluation for infection. Blood Cultures were obtained, as well as Urinalysis, Urine Cx, Sputum Cx and CXR. CXR showed Cardiomegaly with mild central pulmonary vascular congestion. Probable left lower lobe atelectasis or pneumonia. Small left pleural effusion. However patient is afebrile and Has no WBC.  Patient also having TIA workup completed and had ECHO done which showed fraction was in the range of 65% to 70%. Wall motion was normal; there were no regional wall motion abnormalities. The study is not technically sufficient to allow  evaluation of LV diastolic function due to A Flutter. Vascular U/S of Carotids was obtained and pending read. Patient was also Hyperkalemic but repeat K+ showed patient trending down. Will continue workup and if Negative patient can be D/C'd back to SNF likely in AM as we await for the studies to result. Will continue to Monitor patient's response to clinical intervention and repeat Blood work in AM.

## 2017-08-05 NOTE — NC FL2 (Signed)
[place] LEVEL OF CARE SCREENING TOOL     IDENTIFICATION  Patient Name: [name] Birthdate: 1934-01-12 Sex: male Admission Date (Current Location): 08/05/2017  [facility] and [place] Number:  Herbalist and Address:  [facility] [place], [place] [address] [place], [place] [address]      Provider Number: [id number]  Attending Physician Name and Address:  Kerney Elbe, DO  Relative Name and Phone Number:       Current Level of Care: Hospital Recommended Level of Care: [place] Prior Approval Number:    Date Approved/Denied:   PASRR Number:    Discharge Plan: SNF    Current Diagnoses: Patient Active Problem List   Diagnosis Date Noted  . Hyperkalemia 08/05/2017  . Type II diabetes mellitus with renal manifestations ([place]) 08/05/2017  . GERD (gastroesophageal reflux disease) 08/05/2017  . Depression 08/05/2017  . CAD (coronary artery disease) 08/05/2017  . Atrial flutter, chronic ([place])   . Chronic renal failure, stage 4 (severe) (HCC)   . Anger reaction 12/04/2016  . Flat affect 12/04/2016  . Iron deficiency anemia due to chronic blood loss 12/02/2016  . GI bleeding 11/27/2016  . CKD (chronic kidney disease), stage IV ([place]) 11/27/2016  . Pressure injury of skin 09/18/2016  . Leukocytosis   . Mobitz type 1 second degree AV block 09/11/2016  . Pericardial effusion- moderate on CT scan 09/11/2016  . HTN (hypertension) 09/05/2016  . Acute urinary retention 09/05/2016  . Severe obesity (BMI >= 40) ([place]) 09/05/2016  . Stroke (cerebrum) ([place]) 12/09/2015  . Bradycardia 12/08/2015  . Fall 12/08/2015  . Pancytopenia ([place]) 12/08/2015  . Atrial flutter ([place]) 08/29/2015  . Hypoxia 08/28/2015  . PAF (paroxysmal atrial fibrillation) ([place])   . Noncompliance with medications 08/03/2015  . Chronic diastolic CHF (congestive heart failure) ([place]) 07/31/2015  . Cellulitis of left lower extremity 07/31/2015  .  Uncontrolled diabetes mellitus with diabetic nephropathy, with long-term current use of insulin ([place]) 07/31/2015  . Accelerated hypertension 07/31/2015  . Thrombocytopenia ([place]) 07/31/2015  . Lymphedema 06/25/2011    Orientation RESPIRATION BLADDER Height & Weight     Self, Time, Situation, Place  Normal Continent Weight: 194 lb 14.2 oz (88.4 kg) Height:  5\' 10"  (177.8 cm)  BEHAVIORAL SYMPTOMS/MOOD NEUROLOGICAL BOWEL NUTRITION STATUS      Continent Diet(see DC summary)  AMBULATORY STATUS COMMUNICATION OF NEEDS Skin   Extensive Assist Verbally Normal                       Personal Care Assistance Level of Assistance  Bathing, Dressing Bathing Assistance: Maximum assistance   Dressing Assistance: Maximum assistance     Functional Limitations Info             SPECIAL CARE FACTORS FREQUENCY  PT (By licensed PT), OT (By licensed OT)     PT Frequency: 3/wk OT Frequency: 3/wk            Contractures      Additional Factors Info  Code Status, Allergies, Insulin Sliding Scale Code Status Info: FULL Allergies Info: Metformin And Related   Insulin Sliding Scale Info: 4/day       Current Medications (08/05/2017):  This is the current hospital active medication list Current Facility-Administered Medications  Medication Dose Route Frequency Provider Last Rate Last Dose  .  stroke: mapping our early stages of recovery book   Does not apply Once Ivor Costa, MD      .  acetaminophen (TYLENOL) tablet 650 mg  650 mg Oral Q4H PRN Ivor Costa, MD       Or  . acetaminophen (TYLENOL) solution 650 mg  650 mg Per Tube Q4H PRN Ivor Costa, MD       Or  . acetaminophen (TYLENOL) suppository 650 mg  650 mg Rectal Q4H PRN Ivor Costa, MD      . ammonium lactate (LAC-HYDRIN) 12 % lotion 1 application  1 application Topical BID Ivor Costa, MD   1 application at 85/88/50 1000  . aspirin EC tablet 325 mg  325 mg Oral Daily Ivor Costa, MD   325 mg at 08/05/17 2774   Or  . aspirin  suppository 300 mg  300 mg Rectal Daily Ivor Costa, MD      . atorvastatin (LIPITOR) tablet 20 mg  20 mg Oral q1800 Costello, Mary A, NP      . benzonatate (TESSALON) capsule 100 mg  100 mg Oral Q6H PRN Ivor Costa, MD      . ferrous sulfate tablet 325 mg  325 mg Oral TID WC Ivor Costa, MD   325 mg at 08/05/17 0959  . fluticasone (FLONASE) 50 MCG/ACT nasal spray 2 spray  2 spray Each Nare Daily Ivor Costa, MD   2 spray at 08/05/17 1000  . heparin injection 5,000 Units  5,000 Units Subcutaneous Q8H Ivor Costa, MD      . hydrALAZINE (APRESOLINE) injection 5 mg  5 mg Intravenous Q2H PRN Ivor Costa, MD   5 mg at 08/05/17 0705  . HYDROcodone-acetaminophen (NORCO/VICODIN) 5-325 MG per tablet 1 tablet  1 tablet Oral Q6H PRN Ivor Costa, MD      . insulin aspart (novoLOG) 100 UNIT/ML injection        Stopped at 08/05/17 0222  . insulin aspart (novoLOG) injection 0-9 Units  0-9 Units Subcutaneous TID WC Ivor Costa, MD      . insulin glargine (LANTUS) injection 10 Units  10 Units Subcutaneous Q2200 Ivor Costa, MD      . ondansetron [facility]) injection 4 mg  4 mg Intravenous Q8H PRN Ivor Costa, MD      . pantoprazole (PROTONIX) EC tablet 40 mg  40 mg Oral BID Ivor Costa, MD   40 mg at 08/05/17 0959  . risperiDONE (RISPERDAL) tablet 1 mg  1 mg Oral QHS Ivor Costa, MD      . senna [facility]) tablet 17.2 mg  2 tablet Oral Q8H PRN Ivor Costa, MD      . sodium polystyrene (KAYEXALATE) 15 GM/60ML suspension 30 g  30 g Rectal Once Ivor Costa, MD      . zolpidem (AMBIEN) tablet 5 mg  5 mg Oral QHS PRN Ivor Costa, MD         Discharge Medications: Please see discharge summary for a list of discharge medications.  Relevant Imaging Results:  Relevant Lab Results:   Additional Information SSN:  [id number]  Jorge Ny, LCSW

## 2017-08-05 NOTE — Evaluation (Signed)
Clinical/Bedside Swallow Evaluation Patient Details  Name: [name] MRN: [id number] Date of Birth: [birth year]  Today's Date: 08/05/2017 Time: SLP Start Time (ACUTE ONLY): 1400 SLP Stop Time (ACUTE ONLY): 1415 SLP Time Calculation (min) (ACUTE ONLY): 15 min  Past Medical History:  Past Medical History:  Diagnosis Date  . Atrial fibrillation ([place])   . Atrial flutter ([place])    hx/notes 11/28/2016  . CHF (congestive heart failure) ([place])   . Chronic diastolic CHF (congestive heart failure) ([place])    hx/notes 11/28/2016  . CKD (chronic kidney disease), stage IV ([place])    hx/notes 11/28/2016  . Colon cancer ([employer])    hx/notes 11/28/2016  . Coronary artery disease   . Depression    hx/notes 11/28/2016  . Diabetic neuropathy ([place])    hx/notes 11/28/2016  . GI bleeding 11/27/2016   notes 11/28/2016  . History of blood transfusion 11/28/2016   hx/notes 11/28/2016  . Hypertension   . IDDM (insulin dependent diabetes mellitus) ([place])    uncontrolled; hx/notes 11/28/2016   Past Surgical History:  Past Surgical History:  Procedure Laterality Date  . COLONOSCOPY  06/21/2012   Procedure: COLONOSCOPY;  Surgeon: Juanita Craver, MD;  Location: [facility] ENDOSCOPY;  Service: Endoscopy;  Laterality: N/A;  . COLONOSCOPY Left 02/13/2014   Procedure: COLONOSCOPY;  Surgeon: Juanita Craver, MD;  Location: [facility] ENDOSCOPY;  Service: Endoscopy;  Laterality: Left;  . ESOPHAGOGASTRODUODENOSCOPY  06/19/2012   Procedure: ESOPHAGOGASTRODUODENOSCOPY (EGD);  Surgeon: Beryle Beams, MD;  Location: [facility] ENDOSCOPY;  Service: Endoscopy;  Laterality: N/A;  . LAPAROSCOPIC RIGHT COLECTOMY Right 02/15/2014   Procedure: LAPAROSCOPIC ASISSTED RIGHT  COLECTOMY, POSSIBLE OPEN;  Surgeon: Rolm Bookbinder, MD;  Location: [place];  Service: General;  Laterality: Right;  . LEG SURGERY     Left leg surgery. broken leg   HPI:  83 year old man with multiple cerebrovascular risk factors and a history of old right thalamic stroke with residual left-sided  weakness presents for acute onset of left facial droop, dysarthria and left-sided weakness. Progression of chronic microvascular ischemic changes with newer (since last scan) small chronic infarctions in bilateral cerebellar hemisphere and left pons.   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   Assessment / Plan / Recommendation Clinical Impression  Pt presents earlier today with breakfast items covering chest due to poor oral control. Pt presents with a primarily oral dysphagia with decreased labial seal and bolus loss when eating/drinking.  Oral residue noted bilaterally.  No overt s/s of aspiration despite consumption of large, successive boluses of thin liquid.  Phonation clear.  Recommend full liquid diet for now with SLP f/u at SNF for diet progression. Anticipate improvements given no new CVA and Abilify discontinued.  SLP Visit Diagnosis: Dysphagia, oral phase (R13.11)    Aspiration Risk       Diet Recommendation   full liquids; assist with meals as needed  Medication Administration: Whole meds with puree    Other  Recommendations Oral Care Recommendations: Oral care BID   Follow up Recommendations Skilled Nursing facility      Frequency and Duration            Prognosis        Swallow Study   General Date of Onset: 08/05/17 HPI: 83 year old man with multiple cerebrovascular risk factors and a history of old right thalamic stroke with residual left-sided weakness presents for acute onset of left facial droop, dysarthria and left-sided weakness. Progression of chronic microvascular ischemic changes with  newer (since last scan) small chronic infarctions in bilateral cerebellar hemisphere and left pons.   Type of Study: Bedside Swallow Evaluation Previous Swallow Assessment: no Diet Prior to this Study: Regular;Thin liquids Temperature Spikes Noted: No Respiratory Status: Room air History of Recent  Intubation: No Behavior/Cognition: Alert Oral Cavity Assessment: Within Functional Limits Oral Cavity - Dentition: Edentulous Vision: Functional for self-feeding Patient Positioning: Upright in bed Baseline Vocal Quality: Normal Volitional Cough: Strong Volitional Swallow: Able to elicit    Oral/Motor/Sensory Function Overall Oral Motor/Sensory Function: Mild impairment(baseline left lower facial asymmetry; lower lip flaccid/droo)   Ice Chips Ice chips: Not tested   Thin Liquid Thin Liquid: Impaired Presentation: Straw Oral Phase Impairments: Reduced labial seal Oral Phase Functional Implications: Right anterior spillage;Left anterior spillage    Nectar Thick Nectar Thick Liquid: Not tested   Honey Thick Honey Thick Liquid: Not tested   Puree Puree: Within functional limits   Solid   GO   Solid: Impaired Oral Phase Impairments: Reduced labial seal Oral Phase Functional Implications: Right anterior spillage;Left anterior spillage    Functional Assessment Tool Used: clinical judgment Functional Limitations: Swallowing Swallow Current Status ([id number]): At least 20 percent but less than 40 percent impaired, limited or restricted Swallow Goal Status [phone]): At least 20 percent but less than 40 percent impaired, limited or restricted Swallow Discharge Status [phone]): At least 20 percent but less than 40 percent impaired, limited or restricted   Juan Quam Laurice 08/05/2017,2:20 PM

## 2017-08-06 DIAGNOSIS — D500 Iron deficiency anemia secondary to blood loss (chronic): Secondary | ICD-10-CM | POA: Diagnosis not present

## 2017-08-06 DIAGNOSIS — G459 Transient cerebral ischemic attack, unspecified: Secondary | ICD-10-CM | POA: Diagnosis not present

## 2017-08-06 DIAGNOSIS — E1122 Type 2 diabetes mellitus with diabetic chronic kidney disease: Secondary | ICD-10-CM

## 2017-08-06 DIAGNOSIS — I4892 Unspecified atrial flutter: Secondary | ICD-10-CM | POA: Diagnosis not present

## 2017-08-06 DIAGNOSIS — I5032 Chronic diastolic (congestive) heart failure: Secondary | ICD-10-CM | POA: Diagnosis not present

## 2017-08-06 DIAGNOSIS — I6300 Cerebral infarction due to thrombosis of unspecified precerebral artery: Secondary | ICD-10-CM | POA: Diagnosis not present

## 2017-08-06 DIAGNOSIS — N184 Chronic kidney disease, stage 4 (severe): Secondary | ICD-10-CM | POA: Diagnosis not present

## 2017-08-06 DIAGNOSIS — I480 Paroxysmal atrial fibrillation: Secondary | ICD-10-CM | POA: Diagnosis not present

## 2017-08-06 DIAGNOSIS — E875 Hyperkalemia: Secondary | ICD-10-CM | POA: Diagnosis not present

## 2017-08-06 LAB — BASIC METABOLIC PANEL
ANION GAP: 7 (ref 5–15)
BUN: 40 mg/dL — ABNORMAL HIGH (ref 6–20)
CALCIUM: 8.6 mg/dL — AB (ref 8.9–10.3)
CO2: 20 mmol/L — ABNORMAL LOW (ref 22–32)
Chloride: 108 mmol/L (ref 101–111)
Creatinine, Ser: 3.03 mg/dL — ABNORMAL HIGH (ref 0.61–1.24)
GFR, EST AFRICAN AMERICAN: 21 mL/min — AB (ref >60)
GFR, EST NON AFRICAN AMERICAN: 18 mL/min — AB (ref >60)
GLUCOSE: 133 mg/dL — AB (ref 65–99)
Potassium: 5.6 mmol/L — ABNORMAL HIGH (ref 3.5–5.1)
Sodium: 135 mmol/L (ref 135–145)

## 2017-08-06 LAB — COMPREHENSIVE METABOLIC PANEL
ALK PHOS: 63 U/L (ref 38–126)
ALT: 6 U/L — AB (ref 17–63)
AST: 7 U/L — ABNORMAL LOW (ref 15–41)
Albumin: 3.3 g/dL — ABNORMAL LOW (ref 3.5–5.0)
Anion gap: 6 (ref 5–15)
BILIRUBIN TOTAL: 1.1 mg/dL (ref 0.3–1.2)
BUN: 39 mg/dL — ABNORMAL HIGH (ref 6–20)
CALCIUM: 8.6 mg/dL — AB (ref 8.9–10.3)
CO2: 22 mmol/L (ref 22–32)
CREATININE: 2.98 mg/dL — AB (ref 0.61–1.24)
Chloride: 108 mmol/L (ref 101–111)
GFR, EST AFRICAN AMERICAN: 21 mL/min — AB (ref >60)
GFR, EST NON AFRICAN AMERICAN: 18 mL/min — AB (ref >60)
Glucose, Bld: 111 mg/dL — ABNORMAL HIGH (ref 65–99)
Potassium: 5.9 mmol/L — ABNORMAL HIGH (ref 3.5–5.1)
Sodium: 136 mmol/L (ref 135–145)
Total Protein: 6.6 g/dL (ref 6.5–8.1)

## 2017-08-06 LAB — CBC WITH DIFFERENTIAL/PLATELET
Basophils Absolute: 0 10*3/uL (ref 0.0–0.1)
Basophils Relative: 0 %
EOS PCT: 1 %
Eosinophils Absolute: 0.1 10*3/uL (ref 0.0–0.7)
HCT: 31.4 % — ABNORMAL LOW (ref 39.0–52.0)
HEMOGLOBIN: 10.1 g/dL — AB (ref 13.0–17.0)
LYMPHS ABS: 1.4 10*3/uL (ref 0.7–4.0)
LYMPHS PCT: 20 %
MCH: 28.4 pg (ref 26.0–34.0)
MCHC: 32.2 g/dL (ref 30.0–36.0)
MCV: 88.2 fL (ref 78.0–100.0)
Monocytes Absolute: 2.4 10*3/uL — ABNORMAL HIGH (ref 0.1–1.0)
Monocytes Relative: 35 %
Neutro Abs: 3 10*3/uL (ref 1.7–7.7)
Neutrophils Relative %: 44 %
PLATELETS: 161 10*3/uL (ref 150–400)
RBC: 3.56 MIL/uL — AB (ref 4.22–5.81)
RDW: 16.2 % — ABNORMAL HIGH (ref 11.5–15.5)
WBC: 6.9 10*3/uL (ref 4.0–10.5)

## 2017-08-06 LAB — GLUCOSE, CAPILLARY
GLUCOSE-CAPILLARY: 174 mg/dL — AB (ref 65–99)
Glucose-Capillary: 95 mg/dL (ref 65–99)
Glucose-Capillary: 142 mg/dL — ABNORMAL HIGH (ref 65–99)
Glucose-Capillary: 116 mg/dL — ABNORMAL HIGH (ref 65–99)

## 2017-08-06 LAB — PHOSPHORUS: PHOSPHORUS: 4.3 mg/dL (ref 2.5–4.6)

## 2017-08-06 LAB — MAGNESIUM: MAGNESIUM: 1.5 mg/dL — AB (ref 1.7–2.4)

## 2017-08-06 MED ORDER — AMLODIPINE BESYLATE 10 MG PO TABS: 10.0000 mg | ORAL_TABLET | Freq: Every day | ORAL | Status: DC

## 2017-08-06 MED ORDER — AMLODIPINE BESYLATE 5 MG PO TABS
5.0000 mg | ORAL_TABLET | Freq: Every day | ORAL | Status: DC
  Administered 2017-08-06: 5 mg via ORAL
  Filled 2017-08-06: qty 1

## 2017-08-06 MED ORDER — HYDRALAZINE HCL 25 MG PO TABS
25.0000 mg | ORAL_TABLET | Freq: Three times a day (TID) | ORAL | Status: DC
  Administered 2017-08-06 (×2): 25 mg via ORAL
  Filled 2017-08-06 (×2): qty 1

## 2017-08-06 MED ORDER — ASPIRIN 325 MG PO TBEC: 325.0000 mg | DELAYED_RELEASE_TABLET | Freq: Every day | ORAL | 0 refills | Status: DC

## 2017-08-06 MED ORDER — CLONIDINE HCL 0.1 MG/24HR TD PTWK: 0.1000 mg | MEDICATED_PATCH | TRANSDERMAL | Status: DC

## 2017-08-06 MED ORDER — HYDROCODONE-ACETAMINOPHEN 5-325 MG PO TABS: 1.0000 | ORAL_TABLET | Freq: Four times a day (QID) | ORAL | 0 refills | Status: DC | PRN

## 2017-08-06 MED ORDER — INFLUENZA VAC SPLIT HIGH-DOSE 0.5 ML IM SUSY: 0.5000 mL | PREFILLED_SYRINGE | INTRAMUSCULAR | Status: DC

## 2017-08-06 MED ORDER — PATIROMER SORBITEX CALCIUM 8.4 G PO PACK
8.4000 g | PACK | Freq: Every day | ORAL | Status: DC
  Administered 2017-08-06: 8.4 g via ORAL
  Filled 2017-08-06: qty 4

## 2017-08-06 MED ORDER — AMLODIPINE BESYLATE 10 MG PO TABS
10.0000 mg | ORAL_TABLET | Freq: Every day | ORAL | Status: DC
  Administered 2017-08-06: 10 mg via ORAL
  Filled 2017-08-06: qty 1

## 2017-08-06 MED ORDER — CLONIDINE HCL 0.1 MG/24HR TD PTWK
0.1000 mg | MEDICATED_PATCH | TRANSDERMAL | Status: DC
  Administered 2017-08-06: 0.1 mg via TRANSDERMAL
  Filled 2017-08-06: qty 1

## 2017-08-06 MED ORDER — CLONIDINE HCL 0.2 MG/24HR TD PTWK
0.2000 mg | MEDICATED_PATCH | TRANSDERMAL | Status: DC
  Administered 2017-08-06: 0.2 mg via TRANSDERMAL
  Filled 2017-08-06: qty 1

## 2017-08-06 NOTE — Social Work (Signed)
Clinical Social Worker facilitated patient discharge including contacting patient family and facility to confirm patient discharge plans.  Clinical information faxed to facility and family agreeable with plan.  CSW arranged ambulance transport via [facility] to [facility]. RN to call [phone] with report  prior to discharge.  Clinical Social Worker will sign off for now as social work intervention is no longer needed. Please consult [place] again if new need arises.  Alexander Mt, [place] Social Worker

## 2017-08-06 NOTE — Plan of Care (Signed)
  Elimination: Will not experience complications related to urinary retention 08/06/2017 0547 - Progressing by Argel Pablo, Roma Kayser, RN   Pain Managment: General experience of comfort will improve 08/06/2017 0547 - Progressing by Jerimie Mancuso A, RN   Safety: Ability to remain free from injury will improve 08/06/2017 0547 - Progressing by Kashus Karlen, Roma Kayser, RN

## 2017-08-06 NOTE — Progress Notes (Signed)
STROKE TEAM PROGRESS NOTE  Admission history; [name] is a 83 y.o. male who has a past medical history of atrial fibrillation not on anticoagulation because of GI bleed, pancytopenia/thrombocytopenia, colon cancer, chronic diastolic CHF, CKD 1, coronary artery disease, previous stroke with some left-sided weakness, depression who was brought in from his facility when he was noted to have left-sided facial droop, dysarthria.  According to the report, his last known normal was 12:15 AM and he was seen in the ER around 1:35 AM.  Upon further questioning the EMS, it was unclear whether 1215 and was his last known normal was or was that the time when he was seen and found to be not in his normal state of health.  He has had a history of right thalamic stroke with some left-sided deficits.  Upon initial assessment by EMS, his systolic blood pressures were in the 220s.  Blood pressures prior to EMS arrival by the facility were in the 180s. Patient does complain of slurred speech and possible left facial droop.  He feels weaker on the left but could not tell me whether his weakness is old or new.  LKW: 12:15 AM on 08/05/2017. tpa given?: no, mild stroke symptoms, low delta NIH, h/o GIB on anticoag Premorbid modified Rankin scale (mRS): 3-4  SUBJECTIVE (INTERVAL HISTORY) No family is at the bedside. Patient is found sitting in chair in NAD, drooling.  Overall he feels his condition is unchanged. Voices no new complaints. No new events reported overnight.   OBJECTIVE Lab Results: CBC:  Recent Labs  Lab 08/05/17 0127 08/05/17 0139 08/06/17 0312  WBC 7.3  --  6.9  HGB 11.3* 11.2* 10.1*  HCT 34.6* 33.0* 31.4*  MCV 88.3  --  88.2  PLT 170  --  161   BMP: Recent Labs  Lab 08/05/17 0127 08/05/17 0139 08/05/17 0847 08/06/17 0312  NA 135 140 137 136  K 5.9* 6.0* 5.5* 5.9*  CL 108 110 108 108  CO2 21*  --  22 22  GLUCOSE 128* 128* 128* 111*  BUN 39* 35* 37* 39*  CREATININE 2.80*  2.80* 2.85* 2.98*  CALCIUM 8.8*  --  9.1 8.6*  MG  --   --   --  1.5*  PHOS  --   --   --  4.3   Liver Function Tests:  Recent Labs  Lab 08/05/17 0127 08/06/17 0312  AST 8* 7*  ALT 6* 6*  ALKPHOS 71 63  BILITOT 0.6 1.1  PROT 7.3 6.6  ALBUMIN 3.6 3.3*   Coagulation Studies:  Recent Labs    08/05/17 0127  APTT 36  INR 1.12   PHYSICAL EXAM Temp:  [98.1 F (36.7 C)-98.8 F (37.1 C)] 98.1 F (36.7 C) (12/12 1022) Pulse Rate:  [41-66] 66 (12/12 1022) Resp:  [15-18] 18 (12/12 1022) BP: (152-193)/(42-65) 175/48 (12/12 1022) SpO2:  [96 %-100 %] 96 % (12/12 1022) General - Well nourished, well developed, in no apparent distress Respiratory - Lungs clear bilaterally. No wheezing. Cardiovascular - Regular rate and rhythm  NEURO:  Mental Status: AA&Ox3  Language: speech is dysarthric but mostly understandable.  Naming, repetition, fluency, and comprehension intact.positive glabellar tap noted. Diminished  Cranial Nerves: PERRL. EOMI, visual fields full, obvious droop of the left angle of the mouth, facial sensation intact, facial drooling notedhearing intact, tongue/uvula/soft palate midline, normal sternocleidomastoid and trapezius muscle strength. No evidence of tongue atrophy or fibrillations Motor: 5/5 right upper extremity.  2/5 right lower extremity.  4/5  left upper extremity.  2/5 left lower extremity.  Bradykinesia noted in both upper extremities. Tone: Examination of tone in the upper extremity shows cogwheeling and spontaneous resting tremor left greater than right.  Masked facies appearance on the face - likely baseline finding Sensation-decreased sensation to  light touch on the left hemibody. Coordination: FTN intact bilaterally.  Unable to perform lower extremity coordination testing Gait- deferred  IMAGING: I have personally reviewed the radiological images below and agree with the radiology interpretations.  MRI/ MRA Head Wo Contrast Result Date:  08/05/2017 IMPRESSION: 1. No acute intracranial abnormality identified. 2. Progression of chronic microvascular ischemic changes with new small chronic infarctions in bilateral cerebellar hemisphere and left pons. 3. Mild paranasal sinus disease. 4. Negative MRA of the head. No large vessel occlusion, aneurysm, or significant stenosis is identified.   Ct Head Code Stroke Wo Contrast Result Date: 08/05/2017 IMPRESSION: 1. No acute intracranial abnormality identified. 2. ASPECTS is 10 3. Stable mild for age chronic microvascular ischemic changes and mild parenchymal volume loss of the brain. Stable small chronic lacunar infarct of right thalamus and very small chronic infarction of left cerebellar hemisphere.   Echocardiogram:                                           Study Conclusions - Left ventricle: The cavity size was normal. There was moderate   asymmetric hypertrophy. Systolic function was vigorous. The   estimated ejection fraction was in the range of 65% to 70%. Wall   motion was normal; there were no regional wall motion   abnormalities. The study is not technically sufficient to allow   evaluation of LV diastolic function. - Aortic valve: There was trivial regurgitation. - Mitral valve: Calcified annulus. There was mild regurgitation. - Left atrium: The atrium was moderately dilated. - Atrial septum: There was an atrial septal aneurysm. - Pulmonary arteries: Systolic pressure was moderately increased. Impressions: - Pt in atrial flutter during the study; vigorous LV systolic   function; moderate asymmetric LVH; trace AI; mild MR; moderate   LAE; mild TR with moderate pulmonary hypertension.  B/L Carotid U/S:  Right 1-39% ICA stenosis.  Left 60-79% ICA stenosis, based on systolic velocity, and 5-36% based on diastolic velocity. Technically difficult to evaluate left side due to patient keeping head turned to left.                                __________________________________________________________ ASSESSMENT: [name] is a 83 y.o. male with PMH of prior right thalamic stroke with residual left-sided weakness, history of atrial fibrillation not on anticoagulation because of GI bleed, Hx of pancytopenia/thrombocytopenia, colon cancer, chronic diastolic CHF, CKD 1, coronary artery disease brought in from his facility when he was noted to have acute onset left-sided facial droop, dysarthria and left-sided weakness and hypertensive urgency as his systolic blood pressures were in 220's. Decision was made not to use IV TPA as risks outweighed benefits at this time. No signs of large vessel occlusion on exam hence not a candidate for endovascular procedures. Imaging thus far demonstrates:  Stable small chronic lacunar infarct of right thalamus very small chronic infarction of left cerebellar hemisphere.   Likely TIA:  Suspected Etiology: likely represents recrudescence of old symptoms in the setting of hypertension versus new small vessel or embolic  stroke. Resultant Symptoms: Baseline findings: left-sided facial droop, dysarthria and left-sided weakness  Stroke Risk Factors: atrial fibrillation, diabetes mellitus, hyperlipidemia, hypertension and Colon Cancer, CKD, CHF moderate left ICA stenosis-asymptomatic Other Stroke Risk Factors: Advanced age, Hx stroke, Coronary artery disease  Outstanding Stroke Work-up Studies:               Workup completed  08/05/17: Neuro exam remained stable with baseline findings from prior stroke of left facial droop dysarthria and left-sided weakness.  Patient likely had TIA as MRI is negative for acute stroke.  Echocardiogram and carotid ultrasound are pending continue aspirin and statin for now.  Has presented with transient left facial and upper extremity weakness and numbness in the setting of her remote right thalamic infarct, unclear whether this represents TIA versus worsening of old  deficits. Continue ongoing stroke workup. Aspirin 325 mg daily. No family available at the bedside for discussion.  12/12//18: Neuro exam remained stable.  No new weakness noted on exam.  Echocardiogram results noted.  Carotid ultrasound shows left ICA stenosis 60-79% this is asymptomatic as the patient was admitted with complaints of left-sided weakness.  PCP's can monitor this routinely.  Neurology to sign off at this time continue aspirin and statin.  Patient will follow up with neurology clinic in 6 weeks.  PLAN  08/06/2017: Continue EC Aspirin/ Statin Ongoing aggressive stroke risk factor management Patient counseled to be compliant with his antithrombotic medications Follow up with [facility] in 6 weeks - Dr Donald Siva OF STROKES: Baseline Findings:  right thalamic stroke with residual left-sided weakness   Asymptomatic Left ICA Stenosis - 60-79% PCP to routinely monitor  AFIB, CHRONIC: Not on long-term anticoagulation due to Hx of GI bleed  DYSPHAGIA: NPO until passes SLP swallow evaluation  Drug-induced parkinsonism Abilify Discontinued Will continue to monitor Follow up with [facility] in 6 weeks  FLUID/ELECTROLYTE DISORDERS Hyper- Potassium -per medicine team  HYPERTENSION: Stable, some elevated B/P's overnight Permissive hypertension (OK if <220/120) for 24-48 hours post stroke and then gradually normalized within 5-7 days. Hydralazine PRN in progress Long term BP goal normotensive. May slowly restart home B/P medications after 48 hours Home Meds: Clonidine, Hydralzaine  HYPERLIPIDEMIA:    Component Value Date/Time   CHOL 91 08/05/2017 0409   TRIG 84 08/05/2017 0409   HDL 26 (L) 08/05/2017 0409   CHOLHDL 3.5 08/05/2017 0409   VLDL 17 08/05/2017 0409   LDLCALC 48 08/05/2017 0409  Home Meds:  Lipitor 20 mg LDL  goal < 70 Continued on  Lipitor to 20 mg daily Continue statin at discharge  DIABETES: Lab Results  Component Value  Date   HGBA1C 6.0 (H) 08/05/2017  HgbA1c goal < 7.0 Currently on: Novolog & Lantus Continue CBG monitoring and SSI DM education   Other Active Problems: Principal Problem:   Stroke (cerebrum) (HCC) Active Problems:   Chronic diastolic CHF (congestive heart failure) (HCC)   PAF (paroxysmal atrial fibrillation) (HCC)   Atrial flutter (HCC)   HTN (hypertension)   GI bleeding   CKD (chronic kidney disease), stage IV (HCC)   Iron deficiency anemia due to chronic blood loss   Hyperkalemia   Type II diabetes mellitus with renal manifestations (HCC)   GERD (gastroesophageal reflux disease)   Depression   CAD (coronary artery disease)   TIA (transient ischemic attack)  Hospital day # 1  VTE prophylaxis:  Heparin Diet : Diet full liquid Room service appropriate? Yes; Fluid consistency: Thin  Prior Home Stroke Medications: No antithrombotic   Discharge Stroke Meds: Please discharge patient on aspirin 325 mg daily and Lipitor 20 mg , recommend enteric coated ASA due to hx of GI bleed  Disposition: 03-Skilled Nursing Facility Therapy Recs:  SNF Follow Recs:  Follow-up Information    Star Age, MD. Schedule an appointment as soon as possible for a visit in 6 week(s).   Specialties:  Neurology, Radiology Contact information: [address] [place] [address] [phone]          [name], [place]- PCP follow-up in 1-2 weeks  FAMILY UPDATES: No family at bedside  TEAM UPDATES: Debbe Odea, [place], [place] Stroke Neurology Team 08/06/2017 11:50 AM I have personally examined this patient, reviewed notes, independently viewed imaging studies, participated in medical decision making and plan of care.ROS completed by me personally and pertinent positives fully documented  I have made any additions or clarifications directly to the above note. Agree with note above  Antony Contras, MD Medical Director [facility] Pager:  [phone] 08/06/2017 12:32 PM  Neurology to sign off.  Please call for any further questions or concerns.  Thank you for this consultation.  To contact Stroke Continuity provider, please refer to [email or web address] After hours, contact General Neurology

## 2017-08-06 NOTE — Care Management Note (Signed)
Case Management Note  Patient Details  Name: Lawerence Dery MRN: [id number] Date of Birth: Jun 19, 1934  Subjective/Objective:                    Action/Plan: Pt discharging back to [facility] SN[facility] today. No further needs per CM.  Expected Discharge Date:  08/06/17               Expected Discharge Plan:  Skilled Nursing Facility  In-House Referral:  Clinical Social Work  Discharge planning Services     Post Acute Care Choice:    Choice offered to:     DME Arranged:    DME Agency:     HH Arranged:    [place] Agency:     Status of Service:  Completed, signed off  If discussed at [employer], dates discussed:    Additional Comments:  Pollie Friar, RN 08/06/2017, 5:01 PM

## 2017-08-06 NOTE — Progress Notes (Signed)
PT eval addendum- added G-codes    August 26, 2017 1010  Cognition  Arousal/Alertness Awake/alert  Behavior During Therapy WFL for tasks assessed/performed  Overall Cognitive Status Difficult to assess  Area of Impairment Attention;Problem solving;Following commands  Current Attention Level Selective  Following Commands Follows one step commands with increased time  Problem Solving Slow processing;Decreased initiation;Difficulty sequencing;Requires verbal cues;Requires tactile cues  General Comments Knows he is at [facility] and may have had a second stroke. Not able to state year but knows it is December. Tangential speech at times. Difficult to understand at times due to dysarthria.   Difficult to assess due to Impaired communication  Upper Extremity Assessment  Upper Extremity Assessment LUE deficits/detail;RUE deficits/detail  RUE Deficits / Details bil UE tremors noted; shoulder flexion grossly 3-/5, slow movements, decreased grip strength  RUE Coordination decreased fine motor  LUE Deficits / Details bil UE tremors noted; all movements grossly 2-/5  Lower Extremity Assessment  Lower Extremity Assessment Defer to PT evaluation  Cervical / Trunk Assessment  Cervical / Trunk Assessment Kyphotic  PT G-Codes **NOT FOR INPATIENT CLASS**  Functional Assessment Tool Used Clinical judgement  Functional Limitation Mobility: Walking and moving around  Mobility: Walking and Moving Around Current Status (T4196) CL  Mobility: Walking and Moving Around Goal Status (Q2297) CK    Wray Kearns, PT, DPT [phone]

## 2017-08-06 NOTE — Progress Notes (Signed)
   08/05/17 1019  OT G-codes **NOT FOR INPATIENT CLASS**  Functional Assessment Tool Used AM-PAC 6 Clicks Daily Activity;Clinical judgement  Functional Limitation Self care  Self Care Current Status (U9249) CM  Self Care Goal Status (J2419) CK   OT G-Codes.   Lou Cal, OT Pager [phone] 08/06/2017

## 2017-08-06 NOTE — Clinical Social Work Note (Signed)
Clinical Social Work Assessment  Patient Details  Name: [name] MRN: [id number] Date of Birth: 1934-08-06  Date of referral:  08/06/17               Reason for consult:  Facility Placement                Permission sought to share information with:  Facility Sport and exercise psychologist, Family Supports Permission granted to share information::  Yes, Verbal Permission Granted  Name::     [name]  Agency::  SNF  Relationship::  Daughter, friend  Sport and exercise psychologist Information:     Housing/Transportation Living arrangements for the past 2 months:  [place] of Information:  Patient Patient Interpreter Needed:  None Criminal Activity/Legal Involvement Pertinent to Current Situation/Hospitalization:  No - Comment as needed Significant Relationships:  Adult Children, Friend Lives with:  Self, Facility Resident Do you feel safe going back to the place where you live?  Yes Need for family participation in patient care:  No (Coment)  Care giving concerns:  Patient has been at [facility] and has no concerns about care received.   Social Worker assessment / plan:  CSW spoke with patient to confirm that he's from [facility] and plan to return. CSW completed referral and confirmed return with admissions. CSW to contact patient's family about discharge and coordinate return to SNF.  Employment status:  Retired Nurse, adult PT Recommendations:  [place] / Referral to community resources:  [place]  Patient/Family's Response to care:  Patient agreeable to return to SNF.  Patient/Family's Understanding of and Emotional Response to Diagnosis, Current Treatment, and Prognosis:  Patient had difficulty speaking, but nodded understanding of information and the plan to return to [facility]. Patient requested that CSW contact family to tell them about his return. Patient requested that CSW call both daughter  and friend [name], because his friend manages his finances for him.  Emotional Assessment Appearance:  Appears stated age Attitude/Demeanor/Rapport:    Affect (typically observed):  Appropriate Orientation:  Oriented to Self, Oriented to Place, Oriented to  Time, Oriented to Situation Alcohol / Substance use:  Not Applicable Psych involvement (Current and /or in the community):  No (Comment)  Discharge Needs  Concerns to be addressed:  Care Coordination Readmission within the last 30 days:  No Current discharge risk:  Physical Impairment, Dependent with Mobility Barriers to Discharge:  Continued Medical Work up, [place], [place] 08/06/2017, 2:46 PM

## 2017-08-06 NOTE — Progress Notes (Signed)
Pt just got picked up by dispatch transport ,PTAR to [place] [facility] . All due PM medications given CBG 174 basal insulin of Lan tus 10 unit given SG Vs stable. Pt in no apparent distress. Condition at discharge is stable. Pt transported via stretcher accompanied by [employer] and. Vaughan Basta. D/c AVS given.

## 2017-08-06 NOTE — Progress Notes (Signed)
Pt d/c back to [facility], pt is stable. Report called to Medical Assitant Iyana. D/C instructions done with teach back, pt verbalize understanding

## 2017-08-06 NOTE — Discharge Summary (Signed)
Physician Discharge Summary  [name] WUJ:[id number] DOB: [birth year] DOA: 08/05/2017  PCP: [name], PA  Admit date: 08/05/2017 Discharge date: 08/06/2017  Admitted From: SNF Disposition:  SNF   Recommendations for Outpatient Follow-up:  1. F/u on BP - new medications added 2. Please order a Bmet tomorrow- F/u K+- given Veltassa- may need more tomorrow- 3. Need f/u with [facility] in 2-4 weeks for suspected drug induced parkinsonism  4. Need neuro or vascular surgery f/u of L carotid stenosis- needs a repeat carotid ultrasound- f/u for symptoms 5. - if no GI bleed on on ASA, consider resuming full dose anticoagulation for A-fib 6. Abilify has been stopped by neurology      Discharge Condition: stable CODE STATUS:  Full code  Consultations:  neuro    Discharge Diagnoses:  Principal Problem:  TIA Active Problems:   Chronic diastolic CHF (congestive heart failure) (HCC)   PAF (paroxysmal atrial fibrillation)- Atrial flutter (HCC)   HTN (hypertension)   GI bleeding   CKD (chronic kidney disease), stage IV (HCC)   Iron deficiency anemia due to chronic blood loss   Hyperkalemia   Type II diabetes mellitus with renal manifestations (HCC)   GERD (gastroesophageal reflux disease)   Depression   CAD (coronary artery disease)      Subjective: No complaints  Brief Summary: [name] is a 83 y.o. male from SNF with medical history significant of A flutter and atrial fibrillation not on anticoagulation because of GI bleed,pancytopenia/thrombocytopenia,colon cancer, chronic diastolic CHF, CKD-IV, coronary artery disease, previous stroke with some left-sided weakness, depression, who is sent from the SNF with new left-sided facial droop and slurred speech. He has chronic weakness on this side due to a previous CVA and it is difficult to determine how much of his symptoms are new.  Hospital Course:  CT head: Stable small chronic lacunar infarct of right  thalamus and very small chronic infarction of left cerebellar hemisphere. MRI: no acute infarct but has "Progression of chronic microvascular ischemic changes with new small chronic infarctions in bilateral cerebellar hemisphere and left pons." - 2 D ECHO: no thrombus - carotid duplex: Left ICA 60-79% stenosis- see report below- I have spoken with Dr Trula Slade,  vascular surgery who recommends outpt work up as he is asymptomatic- this can be done by neuro or he can be referred to vascular surgery by his PCP (as a new patient).  - no new stroke found on imaging- unable to tell if he is back to his baseline weakness as he has severe dysarthria and is difficult to understand- continues to be weak on the left sided with left facial droop - neuro recommends ASA 325 (increased from 81 mg) and Lipitor 20 mg  A-fib/flutter - not on anticoagulation due to a h/o GI bleed- started on ASA for now- if no further GI bleeding noted, may need to revisit anticoagulation in this gentleman - rate is in 40-70s range on Clonidine  HTN - severe HTN on admission with SBP on 200s- have resumed Clonidine 0.1 mg patch and Hydralazine and added Norvasc  - please f/u BP at SNF   Drug induced parkinsonism - neuro feels he has parkinsonian symptoms and has stopped his Abilify - recommended to f/u with [facility] neurology- I recommend he be seen in 2-4 weeks  Hyperkalemia  - treated with Valtassa today- now is 5.6- I am unsure of his baseline- he is not on any medication that can cause hyperkalemia- ? Due to RTA - cont to  follow at SNF-   CKD 4 - stable  Discharge Instructions  Discharge Instructions    Ambulatory referral to Neurology   Complete by:  As directed    An appointment is requested in approximately: 6 weeks   Diet - low sodium heart healthy   Complete by:  As directed    Increase activity slowly   Complete by:  As directed      Allergies as of 08/06/2017      Reactions   Metformin And Related Other  (See Comments)   Reaction: pt states he thinks he threw up      Medication List    STOP taking these medications   ARIPiprazole 5 MG tablet Commonly known as:  ABILIFY     TAKE these medications   amLODipine 10 MG tablet Commonly known as:  NORVASC Take 1 tablet (10 mg total) by mouth daily. Start taking on:  08/07/2017   Ammonium Lactate 70 % Soln Apply 1 application topically 2 (two) times daily. Apply to BLE   aspirin 325 MG EC tablet Take 1 tablet (325 mg total) by mouth daily. Start taking on:  08/07/2017   atorvastatin 20 MG tablet Commonly known as:  LIPITOR Take 1 tablet (20 mg total) by mouth daily at 6 PM.   benzonatate 100 MG capsule Commonly known as:  TESSALON Take 100 mg by mouth every 6 (six) hours as needed for cough.   cloNIDine 0.1 mg/24hr patch Commonly known as:  CATAPRES - Dosed in mg/24 hr Place 0.1 mg onto the skin once a week. Saturday   ferrous sulfate 325 (65 FE) MG EC tablet Take 1 tablet (325 mg total) by mouth 3 (three) times daily with meals.   fluticasone 50 MCG/ACT nasal spray Commonly known as:  FLONASE Place 2 sprays into both nostrils daily.   hydrALAZINE 25 MG tablet Commonly known as:  APRESOLINE Take 3 tablets (75 mg total) by mouth every 8 (eight) hours.   HYDROcodone-acetaminophen 5-325 MG tablet Commonly known as:  NORCO/VICODIN Take 1 tablet by mouth every 6 (six) hours as needed for moderate pain.   LANTUS SOLOSTAR 100 UNIT/ML Solostar Pen Generic drug:  Insulin Glargine Inject 15 Units into the skin daily at 10 pm.   pantoprazole 40 MG tablet Commonly known as:  PROTONIX Take 1 tablet (40 mg total) by mouth daily at 6 (six) AM.   risperiDONE 1 MG/ML oral solution Commonly known as:  RISPERDAL Take 1 mg by mouth at bedtime.   senna 8.6 MG Tabs tablet Commonly known as:  SENOKOT Take 2 tablets by mouth every 8 (eight) hours as needed for mild constipation.      Follow-up Information    Star Age, MD.  Schedule an appointment as soon as possible for a visit in 6 week(s).   Specialties:  Neurology, Radiology Contact information: [address] [place] [address] [phone]          Allergies  Allergen Reactions  . Metformin And Related Other (See Comments)    Reaction: pt states he thinks he threw up     Procedures/Studies: 2 D ECHO Pt in atrial flutter during the study; vigorous LV systolic   function; moderate asymmetric LVH; trace AI; mild MR; moderate   LAE; mild TR with moderate pulmonary hypertension.  Vascular carotid ultrasound Right Carotid: There is evidence in the right ICA of a 1-39% stenosis. Left Carotid: Left ICA 60-79% stenosis, based on systolic velocity, and 1-24%  based on diastolic velocity.  Dg Chest 2 View  Result Date: 08/05/2017 CLINICAL DATA:  History of atrial fibrillation, CHF, chronic renal insufficiency, coronary artery disease, former smoker. EXAM: CHEST  2 VIEW COMPARISON:  PA and lateral chest x-ray of September 10, 2016 FINDINGS: The lungs are borderline hypoinflated. There is a small left pleural effusion layering posteriorly. The lung markings are coarse in the retrocardiac region but this is not new. The cardiac silhouette is enlarged. The central pulmonary vascularity is mildly prominent. There is calcification in the wall of the aortic arch. The bony thorax exhibits no acute abnormality. IMPRESSION: Cardiomegaly with mild central pulmonary vascular congestion. Probable left lower lobe atelectasis or pneumonia. Small left pleural effusion. Electronically Signed   By: David  Martinique M.D.   On: 08/05/2017 12:48   Mr Brain Wo Contrast  Result Date: 08/05/2017 CLINICAL DATA:  83 y/o  M; left-sided facial weakness.  Dysarthria. EXAM: MRI HEAD WITHOUT CONTRAST MRA HEAD WITHOUT CONTRAST TECHNIQUE: Multiplanar, multiecho pulse sequences of the brain and surrounding structures were obtained without intravenous contrast.  Angiographic images of the head were obtained using MRA technique without contrast. COMPARISON:  08/05/2017 CT of the head. 12/08/2015 MRI of the head. 12/10/2015 MRA of the head. FINDINGS: MRI HEAD FINDINGS Brain: No acute infarction, hemorrhage, hydrocephalus, extra-axial collection or mass lesion. Very small chronic infarcts are present in bilateral cerebellar hemispheres, left pons, and right thalamus, several new from prior MRI. Moderate chronic microvascular ischemic changes parenchymal volume loss of the brain. Vascular: As below. Skull and upper cervical spine: Normal marrow signal. Sinuses/Orbits: Mild paranasal sinus mucosal thickening. No abnormal signal of mastoid air cells. Orbits are unremarkable. Other: None. MRA HEAD FINDINGS Mild motion degradation. Artifactual loss of flow related signal at level of distal M1/proximal M2. Internal carotid arteries:  Patent. Anterior cerebral arteries:  Patent. Middle cerebral arteries: Patent. Anterior communicating artery: Not identified, likely hypoplastic or absent. Posterior communicating arteries:  Patent. Posterior cerebral arteries:  Patent.  Mild left P1 stenosis. Basilar artery:  Patent. Vertebral arteries:  Patent. No evidence of high-grade stenosis, large vessel occlusion, or aneurysm unless noted above. IMPRESSION: 1. No acute intracranial abnormality identified. 2. Progression of chronic microvascular ischemic changes with new small chronic infarctions in bilateral cerebellar hemisphere and left pons. 3. Mild paranasal sinus disease. 4. Negative MRA of the head. No large vessel occlusion, aneurysm, or significant stenosis is identified. Electronically Signed   By: Kristine Garbe M.D.   On: 08/05/2017 06:08   Mr Jodene Nam Head Wo Contrast  Result Date: 08/05/2017 CLINICAL DATA:  83 y/o  M; left-sided facial weakness.  Dysarthria. EXAM: MRI HEAD WITHOUT CONTRAST MRA HEAD WITHOUT CONTRAST TECHNIQUE: Multiplanar, multiecho pulse sequences of the  brain and surrounding structures were obtained without intravenous contrast. Angiographic images of the head were obtained using MRA technique without contrast. COMPARISON:  08/05/2017 CT of the head. 12/08/2015 MRI of the head. 12/10/2015 MRA of the head. FINDINGS: MRI HEAD FINDINGS Brain: No acute infarction, hemorrhage, hydrocephalus, extra-axial collection or mass lesion. Very small chronic infarcts are present in bilateral cerebellar hemispheres, left pons, and right thalamus, several new from prior MRI. Moderate chronic microvascular ischemic changes parenchymal volume loss of the brain. Vascular: As below. Skull and upper cervical spine: Normal marrow signal. Sinuses/Orbits: Mild paranasal sinus mucosal thickening. No abnormal signal of mastoid air cells. Orbits are unremarkable. Other: None. MRA HEAD FINDINGS Mild motion degradation. Artifactual loss of flow related signal at level of distal M1/proximal M2. Internal carotid arteries:  Patent. Anterior cerebral arteries:  Patent. Middle cerebral arteries: Patent. Anterior communicating artery: Not identified, likely hypoplastic or absent. Posterior communicating arteries:  Patent. Posterior cerebral arteries:  Patent.  Mild left P1 stenosis. Basilar artery:  Patent. Vertebral arteries:  Patent. No evidence of high-grade stenosis, large vessel occlusion, or aneurysm unless noted above. IMPRESSION: 1. No acute intracranial abnormality identified. 2. Progression of chronic microvascular ischemic changes with new small chronic infarctions in bilateral cerebellar hemisphere and left pons. 3. Mild paranasal sinus disease. 4. Negative MRA of the head. No large vessel occlusion, aneurysm, or significant stenosis is identified. Electronically Signed   By: Kristine Garbe M.D.   On: 08/05/2017 06:08   Ct Head Code Stroke Wo Contrast  Result Date: 08/05/2017 CLINICAL DATA:  Code stroke. 83 y/o M; left-sided deficits and slurred speech. EXAM: CT HEAD  WITHOUT CONTRAST TECHNIQUE: Contiguous axial images were obtained from the base of the skull through the vertex without intravenous contrast. COMPARISON:  12/07/2016 CT head.  12/07/2016 MRI head. FINDINGS: Brain: No evidence of acute infarction, hemorrhage, hydrocephalus, extra-axial collection or mass lesion/mass effect. Stable very small chronic infarction within left cerebellar hemisphere in chronic lacunar infarction within right thalamus. Stable mild chronic microvascular ischemic changes and parenchymal volume loss of the brain for age. Vascular: Calcific atherosclerosis of carotid siphons. No hyperdense vessel identified. Skull: Normal. Negative for fracture or focal lesion. Sinuses/Orbits: No acute finding. Other: None. ASPECTS [facility] Stroke Program Early CT Score) - Ganglionic level infarction (caudate, lentiform nuclei, internal capsule, insula, M1-M3 cortex): 7 - Supraganglionic infarction (M4-M6 cortex): 3 Total score (0-10 with 10 being normal): 10 IMPRESSION: 1. No acute intracranial abnormality identified. 2. ASPECTS is 10 3. Stable mild for age chronic microvascular ischemic changes and mild parenchymal volume loss of the brain. Stable small chronic lacunar infarct of right thalamus and very small chronic infarction of left cerebellar hemisphere. These results were communicated to Dr. Rory Percy at 1:50 amon 12/11/2018by text page via the [facility] messaging system. Electronically Signed   By: Kristine Garbe M.D.   On: 08/05/2017 01:50       Discharge Exam: Vitals:   08/06/17 1022 08/06/17 1347  BP: (!) 175/48 (!) 170/59  Pulse: 66 63  Resp: 18 20  Temp: 98.1 F (36.7 C) 98.2 F (36.8 C)  SpO2: 96% 100%   Vitals:   08/06/17 0117 08/06/17 0546 08/06/17 1022 08/06/17 1347  BP: (!) 172/49 (!) 188/42 (!) 175/48 (!) 170/59  Pulse: (!) 41 (!) 41 66 63  Resp: 18 18 18 20   Temp: 98.3 F (36.8 C) 98.3 F (36.8 C) 98.1 F (36.7 C) 98.2 F (36.8 C)  TempSrc: Axillary Axillary Oral  Axillary  SpO2: 100% 100% 96% 100%  Weight:      Height:        General: Pt is alert, awake, not in acute distress Cardiovascular: RRR, S1/S2 +, no rubs, no gallops Respiratory: CTA bilaterally, no wheezing, no rhonchi Abdominal: Soft, NT, ND, bowel sounds + Extremities: no edema, no cyanosis    The results of significant diagnostics from this hospitalization (including imaging, microbiology, ancillary and laboratory) are listed below for reference.     Microbiology: Recent Results (from the past 240 hour(s))  Culture, blood (routine x 2)     Status: None (Preliminary result)   Collection Time: 08/05/17 11:35 AM  Result Value Ref Range Status   Specimen Description BLOOD LEFT ANTECUBITAL  Final   Special Requests IN PEDIATRIC BOTTLE Blood Culture adequate volume  Final  Culture NO GROWTH 1 DAY  Final   Report Status PENDING  Incomplete  Culture, blood (routine x 2)     Status: None (Preliminary result)   Collection Time: 08/05/17 11:41 AM  Result Value Ref Range Status   Specimen Description BLOOD RIGHT ANTECUBITAL  Final   Special Requests   Final    BOTTLES DRAWN AEROBIC ONLY Blood Culture adequate volume   Culture NO GROWTH 1 DAY  Final   Report Status PENDING  Incomplete  MRSA PCR Screening     Status: None   Collection Time: 08/05/17  6:22 PM  Result Value Ref Range Status   MRSA by PCR NEGATIVE NEGATIVE Final    Comment:        The GeneXpert MRSA Assay (FDA approved for NASAL specimens only), is one component of a comprehensive MRSA colonization surveillance program. It is not intended to diagnose MRSA infection nor to guide or monitor treatment for MRSA infections.      Labs: BNP (last 3 results) Recent Labs    09/05/16 0857 11/27/16 1736 08/05/17 0409  BNP 557.0* 361.4* 671.2*   Basic Metabolic Panel: Recent Labs  Lab 08/05/17 0127 08/05/17 0139 08/05/17 0847 08/06/17 0312 08/06/17 1506  NA 135 140 137 136 135  K 5.9* 6.0* 5.5* 5.9* 5.6*   CL 108 110 108 108 108  CO2 21*  --  22 22 20*  GLUCOSE 128* 128* 128* 111* 133*  BUN 39* 35* 37* 39* 40*  CREATININE 2.80* 2.80* 2.85* 2.98* 3.03*  CALCIUM 8.8*  --  9.1 8.6* 8.6*  MG  --   --   --  1.5*  --   PHOS  --   --   --  4.3  --    Liver Function Tests: Recent Labs  Lab 08/05/17 0127 08/06/17 0312  AST 8* 7*  ALT 6* 6*  ALKPHOS 71 63  BILITOT 0.6 1.1  PROT 7.3 6.6  ALBUMIN 3.6 3.3*   No results for input(s): LIPASE, AMYLASE in the last 168 hours. No results for input(s): AMMONIA in the last 168 hours. CBC: Recent Labs  Lab 08/05/17 0127 08/05/17 0139 08/06/17 0312  WBC 7.3  --  6.9  NEUTROABS 3.4  --  3.0  HGB 11.3* 11.2* 10.1*  HCT 34.6* 33.0* 31.4*  MCV 88.3  --  88.2  PLT 170  --  161   Cardiac Enzymes: No results for input(s): CKTOTAL, CKMB, CKMBINDEX, TROPONINI in the last 168 hours. BNP: Invalid input(s): POCBNP CBG: Recent Labs  Lab 08/05/17 1633 08/05/17 2110 08/06/17 0632 08/06/17 1124 08/06/17 1618  GLUCAP 96 121* 95 142* 116*   D-Dimer No results for input(s): DDIMER in the last 72 hours. Hgb A1c Recent Labs    08/05/17 0409  HGBA1C 6.0*   Lipid Profile Recent Labs    08/05/17 0409  CHOL 91  HDL 26*  LDLCALC 48  TRIG 84  CHOLHDL 3.5   Thyroid function studies No results for input(s): TSH, T4TOTAL, T3FREE, THYROIDAB in the last 72 hours.  Invalid input(s): FREET3 Anemia work up No results for input(s): VITAMINB12, FOLATE, FERRITIN, TIBC, IRON, RETICCTPCT in the last 72 hours. Urinalysis    Component Value Date/Time   COLORURINE YELLOW 08/05/2017 1859   APPEARANCEUR CLOUDY (A) 08/05/2017 1859   LABSPEC 1.012 08/05/2017 1859   PHURINE 7.0 08/05/2017 1859   GLUCOSEU NEGATIVE 08/05/2017 1859   HGBUR NEGATIVE 08/05/2017 1859   BILIRUBINUR NEGATIVE 08/05/2017 1859   KETONESUR NEGATIVE 08/05/2017 1859  PROTEINUR 100 (A) 08/05/2017 1859   UROBILINOGEN 1.0 05/18/2015 1407   NITRITE NEGATIVE 08/05/2017 1859    LEUKOCYTESUR LARGE (A) 08/05/2017 1859   Sepsis Labs Invalid input(s): PROCALCITONIN,  WBC,  LACTICIDVEN Microbiology Recent Results (from the past 240 hour(s))  Culture, blood (routine x 2)     Status: None (Preliminary result)   Collection Time: 08/05/17 11:35 AM  Result Value Ref Range Status   Specimen Description BLOOD LEFT ANTECUBITAL  Final   Special Requests IN PEDIATRIC BOTTLE Blood Culture adequate volume  Final   Culture NO GROWTH 1 DAY  Final   Report Status PENDING  Incomplete  Culture, blood (routine x 2)     Status: None (Preliminary result)   Collection Time: 08/05/17 11:41 AM  Result Value Ref Range Status   Specimen Description BLOOD RIGHT ANTECUBITAL  Final   Special Requests   Final    BOTTLES DRAWN AEROBIC ONLY Blood Culture adequate volume   Culture NO GROWTH 1 DAY  Final   Report Status PENDING  Incomplete  MRSA PCR Screening     Status: None   Collection Time: 08/05/17  6:22 PM  Result Value Ref Range Status   MRSA by PCR NEGATIVE NEGATIVE Final    Comment:        The GeneXpert MRSA Assay (FDA approved for NASAL specimens only), is one component of a comprehensive MRSA colonization surveillance program. It is not intended to diagnose MRSA infection nor to guide or monitor treatment for MRSA infections.      Time coordinating discharge: Over 30 minutes  SIGNED:   Debbe Odea, MD  [facility] 08/06/2017, 4:31 PM Pager   If 7PM-7AM, please contact night-coverage [email or web address] Password [email or web address]

## 2017-08-06 NOTE — Clinical Social Work Placement (Signed)
   CLINICAL SOCIAL WORK PLACEMENT  NOTE  Date:  08/06/2017  Patient Details  Name: [name] MRN: [id number] Date of Birth: [birth year]  Clinical Social Work is seeking post-discharge placement for this patient at the [place] level of care (*CSW will initial, date and re-position this form in  chart as items are completed):  Yes   Patient/family provided with [place] Work Department's list of facilities offering this level of care within the geographic area requested by the patient (or if unable, by the patient's family).  Yes   Patient/family informed of their freedom to choose among providers that offer the needed level of care, that participate in Medicare, Medicaid or managed care program needed by the patient, have an available bed and are willing to accept the patient.  Yes   Patient/family informed of [facility]'s ownership interest in [facility] and [facility], as well as of the fact that they are under no obligation to receive care at these facilities.  PASRR submitted to EDS on       PASRR number received on       Existing PASRR number confirmed on 08/05/17     FL2 transmitted to all facilities in geographic area requested by pt/family on 08/06/17     FL2 transmitted to all facilities within larger geographic area on       Patient informed that his/her managed care company has contracts with or will negotiate with certain facilities, including the following:        Yes   Patient/family informed of bed offers received.  Patient chooses bed at [facility]     Physician recommends and patient chooses bed at      Patient to be transferred to [facility] on 08/06/17.  Patient to be transferred to facility by [facility]     Patient family notified on 08/06/17 of transfer.  Name of family member notified:  Sharlyn Bologna, Narda Bonds (sister & friend)      PHYSICIAN       Additional Comment:    _______________________________________________ Alexander Mt, [place] 08/06/2017, 4:42 PM

## 2017-08-08 LAB — URINE CULTURE: Culture: 70000 — AB

## 2017-08-10 LAB — CULTURE, BLOOD (ROUTINE X 2)
CULTURE: NO GROWTH
CULTURE: NO GROWTH
Special Requests: ADEQUATE
Special Requests: ADEQUATE

## 2017-10-09 ENCOUNTER — Ambulatory Visit: Payer: Medicare HMO | Admitting: Neurology

## 2017-10-15 ENCOUNTER — Emergency Department (HOSPITAL_COMMUNITY): Payer: Medicare HMO

## 2017-10-15 ENCOUNTER — Inpatient Hospital Stay (HOSPITAL_COMMUNITY)
Admission: EM | Admit: 2017-10-15 | Discharge: 2017-10-25 | DRG: 871 | Disposition: A | Discharge status: Hospice | Payer: Medicare HMO | Attending: Internal Medicine | Admitting: Internal Medicine

## 2017-10-15 ENCOUNTER — Encounter (HOSPITAL_COMMUNITY): Payer: Self-pay | Admitting: Emergency Medicine

## 2017-10-15 DIAGNOSIS — D500 Iron deficiency anemia secondary to blood loss (chronic): Secondary | ICD-10-CM | POA: Diagnosis present

## 2017-10-15 DIAGNOSIS — Z888 Allergy status to other drugs, medicaments and biological substances status: Secondary | ICD-10-CM

## 2017-10-15 DIAGNOSIS — Z79899 Other long term (current) drug therapy: Secondary | ICD-10-CM

## 2017-10-15 DIAGNOSIS — Z794 Long term (current) use of insulin: Secondary | ICD-10-CM

## 2017-10-15 DIAGNOSIS — N179 Acute kidney failure, unspecified: Secondary | ICD-10-CM | POA: Diagnosis present

## 2017-10-15 DIAGNOSIS — Z6827 Body mass index (BMI) 27.0-27.9, adult: Secondary | ICD-10-CM

## 2017-10-15 DIAGNOSIS — E860 Dehydration: Secondary | ICD-10-CM | POA: Diagnosis present

## 2017-10-15 DIAGNOSIS — Z515 Encounter for palliative care: Secondary | ICD-10-CM | POA: Diagnosis present

## 2017-10-15 DIAGNOSIS — Z9049 Acquired absence of other specified parts of digestive tract: Secondary | ICD-10-CM

## 2017-10-15 DIAGNOSIS — E1129 Type 2 diabetes mellitus with other diabetic kidney complication: Secondary | ICD-10-CM | POA: Diagnosis present

## 2017-10-15 DIAGNOSIS — B964 Proteus (mirabilis) (morganii) as the cause of diseases classified elsewhere: Secondary | ICD-10-CM | POA: Diagnosis present

## 2017-10-15 DIAGNOSIS — F329 Major depressive disorder, single episode, unspecified: Secondary | ICD-10-CM | POA: Diagnosis present

## 2017-10-15 DIAGNOSIS — Z960 Presence of urogenital implants: Secondary | ICD-10-CM | POA: Diagnosis present

## 2017-10-15 DIAGNOSIS — Z7189 Other specified counseling: Secondary | ICD-10-CM

## 2017-10-15 DIAGNOSIS — I5032 Chronic diastolic (congestive) heart failure: Secondary | ICD-10-CM | POA: Diagnosis present

## 2017-10-15 DIAGNOSIS — Z66 Do not resuscitate: Secondary | ICD-10-CM | POA: Diagnosis present

## 2017-10-15 DIAGNOSIS — I130 Hypertensive heart and chronic kidney disease with heart failure and stage 1 through stage 4 chronic kidney disease, or unspecified chronic kidney disease: Secondary | ICD-10-CM | POA: Diagnosis present

## 2017-10-15 DIAGNOSIS — L89620 Pressure ulcer of left heel, unstageable: Secondary | ICD-10-CM | POA: Diagnosis present

## 2017-10-15 DIAGNOSIS — I2510 Atherosclerotic heart disease of native coronary artery without angina pectoris: Secondary | ICD-10-CM | POA: Diagnosis present

## 2017-10-15 DIAGNOSIS — A419 Sepsis, unspecified organism: Secondary | ICD-10-CM | POA: Diagnosis not present

## 2017-10-15 DIAGNOSIS — R918 Other nonspecific abnormal finding of lung field: Secondary | ICD-10-CM | POA: Diagnosis present

## 2017-10-15 DIAGNOSIS — D631 Anemia in chronic kidney disease: Secondary | ICD-10-CM | POA: Diagnosis present

## 2017-10-15 DIAGNOSIS — I872 Venous insufficiency (chronic) (peripheral): Secondary | ICD-10-CM | POA: Diagnosis present

## 2017-10-15 DIAGNOSIS — Z7982 Long term (current) use of aspirin: Secondary | ICD-10-CM

## 2017-10-15 DIAGNOSIS — I639 Cerebral infarction, unspecified: Secondary | ICD-10-CM | POA: Diagnosis present

## 2017-10-15 DIAGNOSIS — Z8719 Personal history of other diseases of the digestive system: Secondary | ICD-10-CM

## 2017-10-15 DIAGNOSIS — N390 Urinary tract infection, site not specified: Secondary | ICD-10-CM | POA: Diagnosis present

## 2017-10-15 DIAGNOSIS — R402423 Glasgow coma scale score 9-12, at hospital admission: Secondary | ICD-10-CM | POA: Diagnosis present

## 2017-10-15 DIAGNOSIS — Z7401 Bed confinement status: Secondary | ICD-10-CM

## 2017-10-15 DIAGNOSIS — J90 Pleural effusion, not elsewhere classified: Secondary | ICD-10-CM | POA: Diagnosis present

## 2017-10-15 DIAGNOSIS — L89610 Pressure ulcer of right heel, unstageable: Secondary | ICD-10-CM | POA: Diagnosis present

## 2017-10-15 DIAGNOSIS — K219 Gastro-esophageal reflux disease without esophagitis: Secondary | ICD-10-CM | POA: Diagnosis present

## 2017-10-15 DIAGNOSIS — F0390 Unspecified dementia without behavioral disturbance: Secondary | ICD-10-CM | POA: Diagnosis present

## 2017-10-15 DIAGNOSIS — Z599 Problem related to housing and economic circumstances, unspecified: Secondary | ICD-10-CM

## 2017-10-15 DIAGNOSIS — L89154 Pressure ulcer of sacral region, stage 4: Secondary | ICD-10-CM | POA: Diagnosis present

## 2017-10-15 DIAGNOSIS — Z87891 Personal history of nicotine dependence: Secondary | ICD-10-CM

## 2017-10-15 DIAGNOSIS — I480 Paroxysmal atrial fibrillation: Secondary | ICD-10-CM | POA: Diagnosis present

## 2017-10-15 DIAGNOSIS — R64 Cachexia: Secondary | ICD-10-CM | POA: Diagnosis present

## 2017-10-15 DIAGNOSIS — Z8673 Personal history of transient ischemic attack (TIA), and cerebral infarction without residual deficits: Secondary | ICD-10-CM

## 2017-10-15 DIAGNOSIS — I4892 Unspecified atrial flutter: Secondary | ICD-10-CM | POA: Diagnosis present

## 2017-10-15 DIAGNOSIS — R479 Unspecified speech disturbances: Secondary | ICD-10-CM | POA: Diagnosis present

## 2017-10-15 DIAGNOSIS — Z85038 Personal history of other malignant neoplasm of large intestine: Secondary | ICD-10-CM

## 2017-10-15 DIAGNOSIS — N184 Chronic kidney disease, stage 4 (severe): Secondary | ICD-10-CM | POA: Diagnosis present

## 2017-10-15 DIAGNOSIS — E1122 Type 2 diabetes mellitus with diabetic chronic kidney disease: Secondary | ICD-10-CM | POA: Diagnosis present

## 2017-10-15 DIAGNOSIS — I10 Essential (primary) hypertension: Secondary | ICD-10-CM | POA: Diagnosis present

## 2017-10-15 DIAGNOSIS — E1140 Type 2 diabetes mellitus with diabetic neuropathy, unspecified: Secondary | ICD-10-CM | POA: Diagnosis present

## 2017-10-15 DIAGNOSIS — E875 Hyperkalemia: Secondary | ICD-10-CM | POA: Diagnosis present

## 2017-10-15 LAB — URINALYSIS, ROUTINE W REFLEX MICROSCOPIC
BILIRUBIN URINE: NEGATIVE
Glucose, UA: NEGATIVE mg/dL
KETONES UR: 5 mg/dL — AB
Nitrite: NEGATIVE
PH: 5 (ref 5.0–8.0)
Protein, ur: 30 mg/dL — AB
SPECIFIC GRAVITY, URINE: 1.017 (ref 1.005–1.030)

## 2017-10-15 NOTE — ED Notes (Signed)
IV team at bedside attempting IV and blood draw.  °

## 2017-10-15 NOTE — ED Triage Notes (Signed)
BIB [facility] from [facility], called out for elevated BUN. Normally runs in 60's, now at 22.8.

## 2017-10-15 NOTE — ED Provider Notes (Signed)
[facility] EMERGENCY DEPARTMENT Provider Note   CSN: [id number] Arrival date & time:        History   Chief Complaint Chief Complaint  Patient presents with  . Abnormal Lab    HPI [name] is a 84 y.o. male.  Patient with history of dementia, chronic kidney disease, CHF, atrial fibrillation and flutter, decubitus ulcer --brought in due to abnormal lab work.  Patient was noted to have creatinine in fours, BUN elevated, white blood cell count 20,000.  No other reported symptoms.  Level 5 caveat due to dementia.      Past Medical History:  Diagnosis Date  . Atrial fibrillation ([place])   . Atrial flutter ([place])    hx/notes 11/28/2016  . CHF (congestive heart failure) ([place])   . Chronic diastolic CHF (congestive heart failure) ([place])    hx/notes 11/28/2016  . CKD (chronic kidney disease), stage IV ([place])    hx/notes 11/28/2016  . Colon cancer ([place])    hx/notes 11/28/2016  . Coronary artery disease   . Depression    hx/notes 11/28/2016  . Diabetic neuropathy ([place])    hx/notes 11/28/2016  . GI bleeding 11/27/2016   notes 11/28/2016  . History of blood transfusion 11/28/2016   hx/notes 11/28/2016  . Hypertension   . IDDM (insulin dependent diabetes mellitus) ([place])    uncontrolled; hx/notes 11/28/2016    Patient Active Problem List   Diagnosis Date Noted  . Hyperkalemia 08/05/2017  . Type II diabetes mellitus with renal manifestations ([place]) 08/05/2017  . GERD (gastroesophageal reflux disease) 08/05/2017  . Depression 08/05/2017  . CAD (coronary artery disease) 08/05/2017  . TIA (transient ischemic attack) 08/05/2017  . Atrial flutter, chronic ([place])   . Chronic renal failure, stage 4 (severe) ([facility])   . Anger reaction 12/04/2016  . Flat affect 12/04/2016  . Iron deficiency anemia due to chronic blood loss 12/02/2016  . GI bleeding 11/27/2016  . CKD (chronic kidney disease), stage IV ([place]) 11/27/2016  . Pressure injury of skin 09/18/2016  . Leukocytosis     . Mobitz type 1 second degree AV block 09/11/2016  . Pericardial effusion- moderate on CT scan 09/11/2016  . HTN (hypertension) 09/05/2016  . Acute urinary retention 09/05/2016  . Severe obesity (BMI >= 40) ([place]) 09/05/2016  . Stroke (cerebrum) ([place]) 12/09/2015  . Bradycardia 12/08/2015  . Fall 12/08/2015  . Pancytopenia ([place]) 12/08/2015  . Atrial flutter ([place]) 08/29/2015  . Hypoxia 08/28/2015  . PAF (paroxysmal atrial fibrillation) ([place])   . Noncompliance with medications 08/03/2015  . Chronic diastolic CHF (congestive heart failure) ([place]) 07/31/2015  . Cellulitis of left lower extremity 07/31/2015  . Uncontrolled diabetes mellitus with diabetic nephropathy, with long-term current use of insulin ([place]) 07/31/2015  . Accelerated hypertension 07/31/2015  . Thrombocytopenia ([place]) 07/31/2015  . Lymphedema 06/25/2011    Past Surgical History:  Procedure Laterality Date  . COLONOSCOPY  06/21/2012   Procedure: COLONOSCOPY;  Surgeon: Juanita Craver, MD;  Location[facility] ENDOSCOPY;  Service: Endoscopy;  Laterality: N/A;  . COLONOSCOPY Left 02/13/2014   Procedure: COLONOSCOPY;  Surgeon: Juanita Craver, MD;  Location: [facility] ENDOSCOPY;  Service: Endoscopy;  Laterality: Left;  . ESOPHAGOGASTRODUODENOSCOPY  06/19/2012   Procedure: ESOPHAGOGASTRODUODENOSCOPY (EGD);  Surgeon: Beryle Beams, MD;  Location[facility] END[facility]COPY;  Service: Endoscopy;  Laterality: N/A;  . LAPAROSCOPIC RIGHT COLECTOMY Right 02/15/2014   Procedure: LAPAROSCOPIC ASISSTED RIGHT  COLECTOMY, POSSIBLE OPEN;  Surgeon: Rolm Bookbinder, MD;  Location: [facility];  Service: General;  Laterality: Right;  .  LEG SURGERY     Left leg surgery. broken leg       Home Medications    Prior to Admission medications   Medication Sig Start Date End Date Taking? Authorizing Provider  acetaminophen (TYLENOL) 325 MG tablet Take 650 mg by mouth every 6 (six) hours.   Yes [provider]  amLODipine (NORVASC) 10 MG tablet Take 1 tablet (10 mg total)  by mouth daily. 08/07/17  Yes Debbe Odea, MD  aspirin EC 325 MG EC tablet Take 1 tablet (325 mg total) by mouth daily. 08/07/17  Yes Debbe Odea, MD  atorvastatin (LIPITOR) 20 MG tablet Take 1 tablet (20 mg total) by mouth daily at 6 PM. 12/12/15  Yes Rai, Ripudeep K, MD  b complex-vitamin c-folic acid (NEPHRO-VITE) 0.8 MG TABS tablet Take 1 tablet by mouth daily.   Yes [provider]  benzonatate (TESSALON) 100 MG capsule Take 100 mg by mouth every 6 (six) hours as needed for cough.   Yes [provider]  cloNIDine (CATAPRES - DOSED IN MG/24 HR) 0.1 mg/24hr patch Place 0.1 mg onto the skin once a week. Saturday   Yes [provider]  ferrous sulfate 325 (65 FE) MG EC tablet Take 1 tablet (325 mg total) by mouth 3 (three) times daily with meals. 11/29/16  Yes Short, Noah Delaine, MD  fluticasone (FLONASE) 50 MCG/ACT nasal spray Place 2 sprays into both nostrils daily.   Yes [provider]  glucagon (GLUCAGON EMERGENCY) 1 MG injection Inject 1 mg into the vein once as needed (for low blood sugar).   Yes [provider]  hydrALAZINE (APRESOLINE) 25 MG tablet Take 3 tablets (75 mg total) by mouth every 8 (eight) hours. Patient taking differently: Take 25 mg by mouth every 8 (eight) hours.  09/18/16  Yes Eber Jones, MD  HYDROcodone-acetaminophen (NORCO/VICODIN) 5-325 MG tablet Take 1 tablet by mouth every 6 (six) hours as needed for moderate pain. 08/06/17  Yes Debbe Odea, MD  Insulin Glargine (LANTUS SOLOSTAR) 100 UNIT/ML Solostar Pen Inject 15 Units into the skin daily at 10 pm. 12/04/16  Yes Hendricks Limes, MD  mirtazapine (REMERON) 7.5 MG tablet Take 7.5 mg by mouth at bedtime.   Yes [provider]  pantoprazole (PROTONIX) 40 MG tablet Take 1 tablet (40 mg total) by mouth daily at 6 (six) AM. 11/30/16  Yes Short, Noah Delaine, MD  risperiDONE (RISPERDAL) 1 MG/ML oral solution Take 1 mg by mouth at bedtime.   Yes [provider]   senna (SENOKOT) 8.6 MG TABS tablet Take 2 tablets by mouth every 8 (eight) hours.    Yes [provider]  UNABLE TO FIND Take 120 mLs by mouth 3 (three) times daily. Med Name: House Supplement   Yes [provider]    Family History Family History  Problem Relation Age of Onset  . Brain cancer Mother   . Diabetes type II Sister   . Narcolepsy Paternal Uncle     Social History Social History   Tobacco Use  . Smoking status: Former Smoker    Last attempt to quit: 08/26/1998    Years since quitting: 19.1  . Smokeless tobacco: Never Used  Substance Use Topics  . Alcohol use: No    Comment: in the past  . Drug use: No     Allergies   Metformin and related   Review of Systems Review of Systems  Unable to perform ROS: Dementia     Physical Exam Updated Vital  Signs Ht 6' (1.829 m)   Wt 90.7 kg (200 lb)   SpO2 98%   BMI 27.12 kg/m   Physical Exam  Constitutional: He appears well-developed and well-nourished.  HENT:  Head: Normocephalic and atraumatic.  Mouth/Throat: Mucous membranes are dry.  Eyes: Conjunctivae are normal. Right eye exhibits no discharge. Left eye exhibits no discharge.  Neck: Normal range of motion. Neck supple.  Cardiovascular: Normal rate, regular rhythm and normal heart sounds.  No murmur heard. Pulmonary/Chest: Effort normal. No stridor. No respiratory distress. He has no wheezes. He has rhonchi. He has no rales.  Abdominal: Soft. There is no tenderness. There is no rebound and no guarding.  Neurological: He is alert.  Skin: Skin is warm and dry.  Stage IV decubitus ulcer noted without significant drainage.  Wound is packed with Betadine packing.  Psychiatric: He has a normal mood and affect.  Nursing note and vitals reviewed.    ED Treatments / Results  Labs (all labs ordered are listed, but only abnormal results are displayed) Labs Reviewed  URINALYSIS, ROUTINE W REFLEX MICROSCOPIC - Abnormal; Notable for the  following components:      Result Value   APPearance CLOUDY (*)    Hgb urine dipstick LARGE (*)    Ketones, ur 5 (*)    Protein, ur 30 (*)    Leukocytes, UA LARGE (*)    Bacteria, UA FEW (*)    Squamous Epithelial / LPF 0-5 (*)    All other components within normal limits  CBC WITH DIFFERENTIAL/PLATELET  COMPREHENSIVE METABOLIC PANEL  I-STAT CHEM 8, ED    EKG  EKG Interpretation None       Radiology Dg Chest Portable 1 View  Result Date: 10/15/2017 CLINICAL DATA:  Elevated white blood cell count. EXAM: PORTABLE CHEST 1 VIEW COMPARISON:  Radiograph 07/06/2017.  CT 09/10/2016 FINDINGS: Patient rotated to the right. Similar cardiomegaly to prior exam. Increased retrocardiac opacity and left pleural effusion. Right paratracheal density likely confluent vessels as seen on prior CT. No pulmonary edema. No pneumothorax. IMPRESSION: Left pleural effusion with increased retrocardiac opacity from prior exam, may be compressive atelectasis or pneumonia in the setting of elevated white blood cell count. Cardiomegaly is similar. Electronically Signed   By: Jeb Levering M.D.   On: 10/15/2017 22:51    Procedures Procedures (including critical care time)  Medications Ordered in ED Medications - No data to display   Initial Impression / Assessment and Plan / ED Course  I have reviewed the triage vital signs and the nursing notes.  Pertinent labs & imaging results that were available during my care of the patient were reviewed by me and considered in my medical decision making (see chart for details).     Patient seen and examined. Work-up initiated.   Vital signs reviewed and are as follows: BP (!) 117/51 (BP Location: Right Arm)   Pulse 72   Temp (!) 97.5 F (36.4 C) (Rectal)   Resp 20   Ht 6' (1.829 m)   Wt 90.7 kg (200 lb)   SpO2 100%   BMI 27.12 kg/m   CXR shows PNA. RN and IV team unable to obtain IV access.   I looked at bilateral UE with [place] and could not find a  target.   Pt discussed with and seen by Dr. Christy Gentles. Fem stick performed for labs. Awaiting results.   Handoff to Dr. Christy Gentles at shift change.   Final Clinical Impressions(s) / ED Diagnoses   Final diagnoses:  Acute renal failure superimposed on stage 4 chronic kidney disease, unspecified acute renal failure type [facility])   Pending work-up and dispo decision.   ED Discharge Orders    None       Carlisle Cater, Hershal Coria 10/16/17 0106    Ripley Fraise, MD 10/16/17 [phone]

## 2017-10-15 NOTE — ED Notes (Signed)
MD at bedside to attempt IV/blood draw

## 2017-10-16 DIAGNOSIS — I4892 Unspecified atrial flutter: Secondary | ICD-10-CM | POA: Diagnosis present

## 2017-10-16 DIAGNOSIS — N184 Chronic kidney disease, stage 4 (severe): Secondary | ICD-10-CM

## 2017-10-16 DIAGNOSIS — L89620 Pressure ulcer of left heel, unstageable: Secondary | ICD-10-CM | POA: Diagnosis present

## 2017-10-16 DIAGNOSIS — I480 Paroxysmal atrial fibrillation: Secondary | ICD-10-CM | POA: Diagnosis present

## 2017-10-16 DIAGNOSIS — N390 Urinary tract infection, site not specified: Secondary | ICD-10-CM | POA: Diagnosis present

## 2017-10-16 DIAGNOSIS — A419 Sepsis, unspecified organism: Secondary | ICD-10-CM | POA: Diagnosis present

## 2017-10-16 DIAGNOSIS — N179 Acute kidney failure, unspecified: Secondary | ICD-10-CM

## 2017-10-16 DIAGNOSIS — F0390 Unspecified dementia without behavioral disturbance: Secondary | ICD-10-CM | POA: Diagnosis present

## 2017-10-16 DIAGNOSIS — E1122 Type 2 diabetes mellitus with diabetic chronic kidney disease: Secondary | ICD-10-CM

## 2017-10-16 DIAGNOSIS — Z515 Encounter for palliative care: Secondary | ICD-10-CM | POA: Diagnosis not present

## 2017-10-16 DIAGNOSIS — I10 Essential (primary) hypertension: Secondary | ICD-10-CM

## 2017-10-16 DIAGNOSIS — K219 Gastro-esophageal reflux disease without esophagitis: Secondary | ICD-10-CM | POA: Diagnosis present

## 2017-10-16 DIAGNOSIS — R64 Cachexia: Secondary | ICD-10-CM | POA: Diagnosis present

## 2017-10-16 DIAGNOSIS — L89154 Pressure ulcer of sacral region, stage 4: Secondary | ICD-10-CM | POA: Diagnosis present

## 2017-10-16 DIAGNOSIS — N3000 Acute cystitis without hematuria: Secondary | ICD-10-CM | POA: Diagnosis not present

## 2017-10-16 DIAGNOSIS — I5032 Chronic diastolic (congestive) heart failure: Secondary | ICD-10-CM | POA: Diagnosis not present

## 2017-10-16 DIAGNOSIS — D631 Anemia in chronic kidney disease: Secondary | ICD-10-CM | POA: Diagnosis present

## 2017-10-16 DIAGNOSIS — D500 Iron deficiency anemia secondary to blood loss (chronic): Secondary | ICD-10-CM | POA: Diagnosis present

## 2017-10-16 DIAGNOSIS — F329 Major depressive disorder, single episode, unspecified: Secondary | ICD-10-CM | POA: Diagnosis present

## 2017-10-16 DIAGNOSIS — E875 Hyperkalemia: Secondary | ICD-10-CM | POA: Diagnosis present

## 2017-10-16 DIAGNOSIS — E860 Dehydration: Secondary | ICD-10-CM | POA: Diagnosis present

## 2017-10-16 DIAGNOSIS — J90 Pleural effusion, not elsewhere classified: Secondary | ICD-10-CM | POA: Diagnosis present

## 2017-10-16 DIAGNOSIS — L89610 Pressure ulcer of right heel, unstageable: Secondary | ICD-10-CM | POA: Diagnosis present

## 2017-10-16 DIAGNOSIS — I130 Hypertensive heart and chronic kidney disease with heart failure and stage 1 through stage 4 chronic kidney disease, or unspecified chronic kidney disease: Secondary | ICD-10-CM | POA: Diagnosis present

## 2017-10-16 DIAGNOSIS — Z66 Do not resuscitate: Secondary | ICD-10-CM | POA: Diagnosis present

## 2017-10-16 DIAGNOSIS — E1140 Type 2 diabetes mellitus with diabetic neuropathy, unspecified: Secondary | ICD-10-CM | POA: Diagnosis present

## 2017-10-16 LAB — BASIC METABOLIC PANEL
Anion gap: 18 — ABNORMAL HIGH (ref 5–15)
BUN: 93 mg/dL — AB (ref 6–20)
CHLORIDE: 107 mmol/L (ref 101–111)
CO2: 11 mmol/L — ABNORMAL LOW (ref 22–32)
Calcium: 8 mg/dL — ABNORMAL LOW (ref 8.9–10.3)
Creatinine, Ser: 4.57 mg/dL — ABNORMAL HIGH (ref 0.61–1.24)
GFR calc Af Amer: 13 mL/min — ABNORMAL LOW (ref >60)
GFR, EST NON AFRICAN AMERICAN: 11 mL/min — AB (ref >60)
GLUCOSE: 96 mg/dL (ref 65–99)
POTASSIUM: 4.6 mmol/L (ref 3.5–5.1)
Sodium: 136 mmol/L (ref 135–145)

## 2017-10-16 LAB — CBC
HCT: 30.5 % — ABNORMAL LOW (ref 39.0–52.0)
Hemoglobin: 9.5 g/dL — ABNORMAL LOW (ref 13.0–17.0)
MCH: 27.5 pg (ref 26.0–34.0)
MCHC: 31.1 g/dL (ref 30.0–36.0)
MCV: 88.2 fL (ref 78.0–100.0)
Platelets: 178 10*3/uL (ref 150–400)
RBC: 3.46 MIL/uL — ABNORMAL LOW (ref 4.22–5.81)
RDW: 16.6 % — ABNORMAL HIGH (ref 11.5–15.5)
WBC: 21 10*3/uL — ABNORMAL HIGH (ref 4.0–10.5)

## 2017-10-16 LAB — COMPREHENSIVE METABOLIC PANEL
ALBUMIN: 2.5 g/dL — AB (ref 3.5–5.0)
ALT: <5 U/L — ABNORMAL LOW (ref 17–63)
AST: 12 U/L — AB (ref 15–41)
Alkaline Phosphatase: 110 U/L (ref 38–126)
Anion gap: 17 — ABNORMAL HIGH (ref 5–15)
BILIRUBIN TOTAL: 1.5 mg/dL — AB (ref 0.3–1.2)
BUN: 94 mg/dL — AB (ref 6–20)
CO2: 9 mmol/L — AB (ref 22–32)
Calcium: 8.1 mg/dL — ABNORMAL LOW (ref 8.9–10.3)
Chloride: 109 mmol/L (ref 101–111)
Creatinine, Ser: 4.61 mg/dL — ABNORMAL HIGH (ref 0.61–1.24)
GFR calc Af Amer: 12 mL/min — ABNORMAL LOW (ref >60)
GFR calc non Af Amer: 11 mL/min — ABNORMAL LOW (ref >60)
GLUCOSE: 112 mg/dL — AB (ref 65–99)
POTASSIUM: 5.8 mmol/L — AB (ref 3.5–5.1)
SODIUM: 135 mmol/L (ref 135–145)
Total Protein: 5.8 g/dL — ABNORMAL LOW (ref 6.5–8.1)

## 2017-10-16 LAB — CBC WITH DIFFERENTIAL/PLATELET
Basophils Absolute: 0 10*3/uL (ref 0.0–0.1)
Basophils Relative: 0 %
EOS PCT: 0 %
Eosinophils Absolute: 0 10*3/uL (ref 0.0–0.7)
HEMATOCRIT: 28.6 % — AB (ref 39.0–52.0)
Hemoglobin: 9.5 g/dL — ABNORMAL LOW (ref 13.0–17.0)
Lymphocytes Relative: 4 %
Lymphs Abs: 0.9 10*3/uL (ref 0.7–4.0)
MCH: 29.1 pg (ref 26.0–34.0)
MCHC: 33.2 g/dL (ref 30.0–36.0)
MCV: 87.5 fL (ref 78.0–100.0)
MONO ABS: 1.6 10*3/uL — AB (ref 0.1–1.0)
Monocytes Relative: 7 %
NEUTROS PCT: 89 %
Neutro Abs: 19.8 10*3/uL — ABNORMAL HIGH (ref 1.7–7.7)
PLATELETS: 241 10*3/uL (ref 150–400)
RBC: 3.27 MIL/uL — AB (ref 4.22–5.81)
RDW: 16.9 % — AB (ref 11.5–15.5)
WBC: 22.3 10*3/uL — AB (ref 4.0–10.5)

## 2017-10-16 LAB — BRAIN NATRIURETIC PEPTIDE: B NATRIURETIC PEPTIDE 5: 119.5 pg/mL — AB (ref 0.0–100.0)

## 2017-10-16 LAB — CBG MONITORING, ED: Glucose-Capillary: 98 mg/dL (ref 65–99)

## 2017-10-16 LAB — LACTIC ACID, PLASMA: Lactic Acid, Venous: 1.1 mmol/L (ref 0.5–1.9)

## 2017-10-16 LAB — PROCALCITONIN: Procalcitonin: 5.29 ng/mL

## 2017-10-16 MED ORDER — SENNA 8.6 MG PO TABS
2.0000 | ORAL_TABLET | Freq: Three times a day (TID) | ORAL | Status: DC
  Administered 2017-10-16 – 2017-10-20 (×10): 17.2 mg via ORAL
  Filled 2017-10-16 (×15): qty 2

## 2017-10-16 MED ORDER — INSULIN GLARGINE 100 UNIT/ML ~~LOC~~ SOLN
10.0000 [IU] | Freq: Every day | SUBCUTANEOUS | Status: DC
  Administered 2017-10-16 – 2017-10-19 (×3): 10 [IU] via SUBCUTANEOUS
  Filled 2017-10-16 (×6): qty 0.1

## 2017-10-16 MED ORDER — PANTOPRAZOLE SODIUM 40 MG PO TBEC
40.0000 mg | DELAYED_RELEASE_TABLET | Freq: Every day | ORAL | Status: DC
Start: 2017-10-16 — End: 2017-10-21
  Administered 2017-10-16 – 2017-10-20 (×5): 40 mg via ORAL
  Filled 2017-10-16 (×6): qty 1

## 2017-10-16 MED ORDER — SODIUM CHLORIDE 0.9 % IV BOLUS (SEPSIS)
1000.0000 mL | Freq: Once | INTRAVENOUS | Status: AC
  Administered 2017-10-16: 1000 mL via INTRAVENOUS

## 2017-10-16 MED ORDER — ACETAMINOPHEN 325 MG PO TABS
650.0000 mg | ORAL_TABLET | Freq: Four times a day (QID) | ORAL | Status: DC | PRN
  Filled 2017-10-16: qty 2

## 2017-10-16 MED ORDER — LIDOCAINE HCL (PF) 1 % IJ SOLN
2.0000 mL | Freq: Once | INTRAMUSCULAR | Status: AC
  Administered 2017-10-16: 2 mL
  Filled 2017-10-16: qty 5

## 2017-10-16 MED ORDER — HYDRALAZINE HCL 20 MG/ML IJ SOLN: 5.0000 mg | INTRAMUSCULAR | Status: DC | PRN

## 2017-10-16 MED ORDER — RENA-VITE PO TABS
1.0000 | ORAL_TABLET | Freq: Every day | ORAL | Status: DC
  Administered 2017-10-16 – 2017-10-19 (×3): 1 via ORAL
  Filled 2017-10-16 (×6): qty 1

## 2017-10-16 MED ORDER — MIRTAZAPINE 7.5 MG PO TABS
7.5000 mg | ORAL_TABLET | Freq: Every day | ORAL | Status: DC
  Administered 2017-10-16 – 2017-10-19 (×3): 7.5 mg via ORAL
  Filled 2017-10-16 (×7): qty 1

## 2017-10-16 MED ORDER — CLONIDINE HCL 0.1 MG/24HR TD PTWK: 0.1000 mg | MEDICATED_PATCH | TRANSDERMAL | Status: DC

## 2017-10-16 MED ORDER — HEPARIN SODIUM (PORCINE) 5000 UNIT/ML IJ SOLN
5000.0000 [IU] | Freq: Three times a day (TID) | INTRAMUSCULAR | Status: DC
  Administered 2017-10-16 – 2017-10-20 (×12): 5000 [IU] via SUBCUTANEOUS
  Filled 2017-10-16 (×13): qty 1

## 2017-10-16 MED ORDER — ZOLPIDEM TARTRATE 5 MG PO TABS
5.0000 mg | ORAL_TABLET | Freq: Every evening | ORAL | Status: DC | PRN
  Administered 2017-10-16: 5 mg via ORAL
  Filled 2017-10-16 (×3): qty 1

## 2017-10-16 MED ORDER — ASPIRIN EC 325 MG PO TBEC
325.0000 mg | DELAYED_RELEASE_TABLET | Freq: Every day | ORAL | Status: DC
  Administered 2017-10-16 – 2017-10-20 (×5): 325 mg via ORAL
  Filled 2017-10-16 (×5): qty 1

## 2017-10-16 MED ORDER — ACETAMINOPHEN 650 MG RE SUPP
650.0000 mg | Freq: Four times a day (QID) | RECTAL | Status: DC | PRN
Start: 2017-10-16 — End: 2017-10-25

## 2017-10-16 MED ORDER — FERROUS SULFATE 325 (65 FE) MG PO TABS
325.0000 mg | ORAL_TABLET | Freq: Three times a day (TID) | ORAL | Status: DC
  Administered 2017-10-16 – 2017-10-20 (×10): 325 mg via ORAL
  Filled 2017-10-16 (×11): qty 1

## 2017-10-16 MED ORDER — ONDANSETRON HCL 4 MG PO TABS: 4.0000 mg | ORAL_TABLET | Freq: Four times a day (QID) | ORAL | Status: DC | PRN

## 2017-10-16 MED ORDER — LIDOCAINE HCL (PF) 1 % IJ SOLN
INTRAMUSCULAR | Status: AC
  Filled 2017-10-16: qty 5

## 2017-10-16 MED ORDER — FLUTICASONE PROPIONATE 50 MCG/ACT NA SUSP
2.0000 | Freq: Every day | NASAL | Status: DC
  Administered 2017-10-16 – 2017-10-23 (×6): 2 via NASAL
  Filled 2017-10-16 (×2): qty 16

## 2017-10-16 MED ORDER — COLLAGENASE 250 UNIT/GM EX OINT
TOPICAL_OINTMENT | Freq: Every day | CUTANEOUS | Status: DC
  Administered 2017-10-17 – 2017-10-23 (×5): via TOPICAL
  Filled 2017-10-16 (×2): qty 30

## 2017-10-16 MED ORDER — AMLODIPINE BESYLATE 5 MG PO TABS: 10.0000 mg | ORAL_TABLET | Freq: Every day | ORAL | Status: DC

## 2017-10-16 MED ORDER — SODIUM CHLORIDE 0.9 % IV SOLN
1.0000 g | INTRAVENOUS | Status: DC
  Administered 2017-10-17 – 2017-10-20 (×4): 1 g via INTRAVENOUS
  Filled 2017-10-16 (×4): qty 10

## 2017-10-16 MED ORDER — SODIUM POLYSTYRENE SULFONATE 15 GM/60ML PO SUSP
15.0000 g | Freq: Once | ORAL | Status: AC
  Administered 2017-10-16: 15 g via ORAL
  Filled 2017-10-16: qty 60

## 2017-10-16 MED ORDER — ATORVASTATIN CALCIUM 20 MG PO TABS
20.0000 mg | ORAL_TABLET | Freq: Every day | ORAL | Status: DC
Start: 2017-10-16 — End: 2017-10-20
  Administered 2017-10-16 – 2017-10-19 (×3): 20 mg via ORAL
  Filled 2017-10-16 (×4): qty 1

## 2017-10-16 MED ORDER — SODIUM CHLORIDE 0.9 % IV SOLN
INTRAVENOUS | Status: DC
  Administered 2017-10-16 (×2): via INTRAVENOUS
  Administered 2017-10-17: 1000 mL via INTRAVENOUS

## 2017-10-16 MED ORDER — ONDANSETRON HCL 4 MG/2ML IJ SOLN: 4.0000 mg | Freq: Four times a day (QID) | INTRAMUSCULAR | Status: DC | PRN

## 2017-10-16 MED ORDER — RISPERIDONE 1 MG PO TABS
1.0000 mg | ORAL_TABLET | Freq: Every day | ORAL | Status: DC
  Administered 2017-10-16 – 2017-10-19 (×3): 1 mg via ORAL
  Filled 2017-10-16 (×8): qty 1

## 2017-10-16 MED ORDER — BENZONATATE 100 MG PO CAPS: 100.0000 mg | ORAL_CAPSULE | Freq: Four times a day (QID) | ORAL | Status: DC | PRN

## 2017-10-16 MED ORDER — HYDROCODONE-ACETAMINOPHEN 5-325 MG PO TABS
1.0000 | ORAL_TABLET | Freq: Four times a day (QID) | ORAL | Status: DC | PRN
  Administered 2017-10-16 – 2017-10-18 (×3): 1 via ORAL
  Filled 2017-10-16 (×4): qty 1

## 2017-10-16 MED ORDER — HYDRALAZINE HCL 25 MG PO TABS: 25.0000 mg | ORAL_TABLET | Freq: Three times a day (TID) | ORAL | Status: DC

## 2017-10-16 MED ORDER — CEFTRIAXONE SODIUM 1 G IJ SOLR
1.0000 g | Freq: Once | INTRAMUSCULAR | Status: AC
  Administered 2017-10-16: 1 g via INTRAMUSCULAR
  Filled 2017-10-16: qty 10

## 2017-10-16 NOTE — ED Notes (Signed)
Patient is resting comfortably.  Respirations even and unlabored

## 2017-10-16 NOTE — ED Provider Notes (Signed)
Patient seen/examined in the Emergency Department in conjunction with Midlevel Provider [place] Patient presents from nursing home for abnormal labs Exam : Awake but appears confused.  He has chronic contractures Plan: Labs pending at this time Due to lack of IV access, I did a femoral stick to obtain   routine labs Labs pending at this time   Ripley Fraise, MD 10/16/17 [phone]

## 2017-10-16 NOTE — ED Provider Notes (Signed)
Procedure note-intraosseous line. Due to lack of IV access, patient required IO access. Right tibial surface was cleansed with Betadine.  Lidocaine 1% was in injected for local anesthesia. I was able to place an IO in the right tibia without difficulty.  I was able to aspirate blood as well as flushed without difficulty.    Patient from a nursing facility for worsening renal failure, UTI, potentially pneumonia.  He has an elevated white count.  He also has worsening creatinine.  His potassium is also mildly elevated. Due to lack of access an IO was placed, to give IV fluids.  At this point, I do not feel central access was required. we will also give Rocephin IM.  Blood pressures been mostly stable and above 100. We will admit to the hospital  CRITICAL CARE Performed by: Sharyon Cable Total critical care time: 33 minutes Critical care time was exclusive of separately billable procedures and treating other patients. Critical care was necessary to treat or prevent imminent or life-threatening deterioration. Critical care was time spent personally by me on the following activities: development of treatment plan with patient and/or surrogate as well as nursing, discussions with consultants, evaluation of patient's response to treatment, examination of patient, obtaining history from patient or surrogate, ordering and performing treatments and interventions, ordering and review of laboratory studies, ordering and review of radiographic studies, pulse oximetry and re-evaluation of patient's condition.    Ripley Fraise, MD 10/16/17 [phone]

## 2017-10-16 NOTE — Progress Notes (Signed)
Patient admitted to floor via air bed at 6pm. IV fluids started. Telemetry applied as ordered. [place] nurse addressed and dressed all wounds this a.m. Patient with indwelling foley, chronic.  Patient stable and comfortable to pass on to night RN.  Sheliah Plane RN

## 2017-10-16 NOTE — H&P (Signed)
History and Physical    [name] SWH:[id number] DOB: Aug 03, 1934 DOA: 10/15/2017  Referring MD/NP/PA:   PCP: [name], PA   Patient coming from:  The patient is coming from SNF.  At baseline, pt is dependent for most of ADL.  Chief Complaint: abnormal lab  HPI: [name] is a 84 y.o. male with medical history significant of significant of A flutter and atrial fibrillation not on anticoagulation because of GI bleed,anemia,colon cancer, chronic diastolic CHF, CKD-IV, coronary artery disease, previous stroke (bed bound and minimally verbal), depression, s/p of indwelling foley cath, who presents with abnormal lab.  Pt is almost nonverbal, and is unable to provide accurate medical history, therefore, most of the history is obtained by discussing the case with ED physician, per EMS report, and with the nursing staff. He can say yes or no.   Per report, pt is from [place], called out due to abnormal lab. Pt was found to have worsening renal function and leukocytosis in facility. When I say pt in ED, no active cough, respiratory distress noted. No active nausea, vomiting, diarrhea noted. He he moves arms is slightly, and can barely move his legs. He has stage IV decubitus sacral ulcers and ulcers in both heels. He say no when asked if he has any CP or any pain anywhere.  ED Course: pt was found to have positive urinalysis for UTI, WBC 22.3, potassium of 5.8, bicarbonate 9, creatinine 4.61, bili 94, temperature normal, no tachycardia, has tachypnea, oxygen saturation 100% on room air. Chest x-ray showed a left pleural effusion and retrocardial opacity. Pt is admitted to tele bed as inpt. Has difficulty getting IV line and IO access was placed by EDP.   Review of Systems: Could not be reviewed accurately due to nonverbal.  Allergy:  Allergies  Allergen Reactions  . Metformin And Related Other (See Comments)    Reaction: pt states he thinks he threw up    Past Medical  History:  Diagnosis Date  . Atrial fibrillation ([place])   . Atrial flutter ([place])    hx/notes 11/28/2016  . CHF (congestive heart failure) ([place])   . Chronic diastolic CHF (congestive heart failure) ([place])    hx/notes 11/28/2016  . CKD (chronic kidney disease), stage IV ([place])    hx/notes 11/28/2016  . Colon cancer ([place])    hx/notes 11/28/2016  . Coronary artery disease   . Depression    hx/notes 11/28/2016  . Diabetic neuropathy ([place])    hx/notes 11/28/2016  . GI bleeding 11/27/2016   notes 11/28/2016  . History of blood transfusion 11/28/2016   hx/notes 11/28/2016  . Hypertension   . IDDM (insulin dependent diabetes mellitus) ([place])    uncontrolled; hx/notes 11/28/2016    Past Surgical History:  Procedure Laterality Date  . COLONOSCOPY  06/21/2012   Procedure: COLONOSCOPY;  Surgeon: Juanita Craver, MD;  Location: [facility] ENDOSCOPY;  Service: Endoscopy;  Laterality: N/A;  . COLONOSCOPY Left 02/13/2014   Procedure: COLONOSCOPY;  Surgeon: Juanita Craver, MD;  [facility] ENDOSCOPY;  Service: Endoscopy;  Laterality: Left;  . ESOPHAGOGASTRODUODENOSCOPY  06/19/2012   Procedure: ESOPHAGOGASTRODUODENOSCOPY (EGD);  Surgeon: Beryle Beams, MD;  Location: [facility] ENDOSCOPY;  Service: Endoscopy;  Laterality: N/A;  . LAPAROSCOPIC RIGHT COLECTOMY Right 02/15/2014   Procedure: LAPAROSCOPIC ASISSTED RIGHT  COLECTOMY, POSSIBLE OPEN;  Surgeon: Rolm Bookbinder, MD;  Location: [employer];  Service: General;  Laterality: Right;  . LEG SURGERY     Left leg surgery. broken leg    Social History:  reports that he quit smoking about 19 years ago. he has never used smokeless tobacco. He reports that he does not drink alcohol or use drugs.  Family History:  Family History  Problem Relation Age of Onset  . Brain cancer Mother   . Diabetes type II Sister   . Narcolepsy Paternal Uncle      Prior to Admission medications   Medication Sig Start Date End Date Taking? Authorizing Provider  acetaminophen (TYLENOL) 325 MG tablet Take 650 mg  by mouth every 6 (six) hours.   Yes [provider]  amLODipine (NORVASC) 10 MG tablet Take 1 tablet (10 mg total) by mouth daily. 08/07/17  Yes Debbe Odea, MD  aspirin EC 325 MG EC tablet Take 1 tablet (325 mg total) by mouth daily. 08/07/17  Yes Debbe Odea, MD  atorvastatin (LIPITOR) 20 MG tablet Take 1 tablet (20 mg total) by mouth daily at 6 PM. 12/12/15  Yes Rai, Ripudeep K, MD  b complex-vitamin c-folic acid (NEPHRO-VITE) 0.8 MG TABS tablet Take 1 tablet by mouth daily.   Yes [provider]  benzonatate (TESSALON) 100 MG capsule Take 100 mg by mouth every 6 (six) hours as needed for cough.   Yes [provider]  cloNIDine (CATAPRES - DOSED IN MG/24 HR) 0.1 mg/24hr patch Place 0.1 mg onto the skin once a week. Saturday   Yes [provider]  ferrous sulfate 325 (65 FE) MG EC tablet Take 1 tablet (325 mg total) by mouth 3 (three) times daily with meals. 11/29/16  Yes Short, Noah Delaine, MD  fluticasone (FLONASE) 50 MCG/ACT nasal spray Place 2 sprays into both nostrils daily.   Yes [provider]  glucagon (GLUCAGON EMERGENCY) 1 MG injection Inject 1 mg into the vein once as needed (for low blood sugar).   Yes [provider]  hydrALAZINE (APRESOLINE) 25 MG tablet Take 3 tablets (75 mg total) by mouth every 8 (eight) hours. Patient taking differently: Take 25 mg by mouth every 8 (eight) hours.  09/18/16  Yes Eber Jones, MD  HYDROcodone-acetaminophen (NORCO/VICODIN) 5-325 MG tablet Take 1 tablet by mouth every 6 (six) hours as needed for moderate pain. 08/06/17  Yes Debbe Odea, MD  Insulin Glargine (LANTUS SOLOSTAR) 100 UNIT/ML Solostar Pen Inject 15 Units into the skin daily at 10 pm. 12/04/16  Yes Hendricks Limes, MD  mirtazapine (REMERON) 7.5 MG tablet Take 7.5 mg by mouth at bedtime.   Yes [provider]  pantoprazole (PROTONIX) 40 MG tablet Take 1 tablet (40 mg total) by mouth daily at 6 (six) AM. 11/30/16  Yes  Short, Noah Delaine, MD  risperiDONE (RISPERDAL) 1 MG/ML oral solution Take 1 mg by mouth at bedtime.   Yes [provider]  senna (SENOKOT) 8.6 MG TABS tablet Take 2 tablets by mouth every 8 (eight) hours.    Yes [provider]  UNABLE TO FIND Take 120 mLs by mouth 3 (three) times daily. Med Name: House Supplement   Yes [provider]    Physical Exam: Vitals:   10/15/17 2220 10/15/17 2236 10/16/17 0003 10/16/17 0105  BP:  (!) 117/51  (!) 110/55  Pulse:  72  67  Resp:  20  (!) 21  Temp:  (!) 97.5 F (36.4 C) (!) 97.5 F (36.4 C)   TempSrc:  Axillary Rectal   SpO2:  100%  100%  Weight: 90.7 kg (200 lb)     Height: 6' (1.829 m)      General: Not  in acute distress HEENT:       Eyes: PERRL, EOMI, no scleral icterus.       ENT: No discharge from the ears and nose, no pharynx injection, no tonsillar enlargement.        Neck: No JVD, no bruit, no mass felt. Heme: No neck lymph node enlargement. Cardiac: S1/S2, RRR, No murmurs, No gallops or rubs. Respiratory:  No rales, wheezing, rhonchi or rubs. GI: Soft, nondistended, nontender, no rebound pain, no organomegaly, BS present. GU: No hematuria Ext: No pitting leg edema bilaterally. Has chronic venous insufficiency changes in legs. 2+DP/PT pulse bilaterally. Musculoskeletal: No joint deformities, No joint redness or warmth, no limitation of ROM in spin. Skin: has stage IV sacral ulcer and ulcers in both heels. Neuro: Not available, cranial nerves II-XII grossly intact, slightly moves arms, but cannot legs.  Psych: Patient is not psychotic, no suicidal or hemocidal ideation.  Labs on Admission: I have personally reviewed following labs and imaging studies  CBC: Recent Labs  Lab 10/16/17 0026  WBC 22.3*  NEUTROABS 19.8*  HGB 9.5*  HCT 28.6*  MCV 87.5  PLT 683   Basic Metabolic Panel: Recent Labs  Lab 10/16/17 0026  NA 135  K 5.8*  CL 109  CO2 9*  GLUCOSE 112*  BUN 94*  CREATININE 4.61*    CALCIUM 8.1*   GFR: Estimated Creatinine Clearance: 13.8 mL/min (A) (by C-G formula based on SCr of 4.61 mg/dL (H)). Liver Function Tests: Recent Labs  Lab 10/16/17 0026  AST 12*  ALT <5*  ALKPHOS 110  BILITOT 1.5*  PROT 5.8*  ALBUMIN 2.5*   No results for input(s): LIPASE, AMYLASE in the last 168 hours. No results for input(s): AMMONIA in the last 168 hours. Coagulation Profile: No results for input(s): INR, PROTIME in the last 168 hours. Cardiac Enzymes: No results for input(s): CKTOTAL, CKMB, CKMBINDEX, TROPONINI in the last 168 hours. BNP (last 3 results) No results for input(s): PROBNP in the last 8760 hours. HbA1C: No results for input(s): HGBA1C in the last 72 hours. CBG: No results for input(s): GLUCAP in the last 168 hours. Lipid Profile: No results for input(s): CHOL, HDL, LDLCALC, TRIG, CHOLHDL, LDLDIRECT in the last 72 hours. Thyroid Function Tests: No results for input(s): TSH, T4TOTAL, FREET4, T3FREE, THYROIDAB in the last 72 hours. Anemia Panel: No results for input(s): VITAMINB12, FOLATE, FERRITIN, TIBC, IRON, RETICCTPCT in the last 72 hours. Urine analysis:    Component Value Date/Time   COLORURINE YELLOW 10/15/2017 2237   APPEARANCEUR CLOUDY (A) 10/15/2017 2237   LABSPEC 1.017 10/15/2017 2237   PHURINE 5.0 10/15/2017 2237   GLUCOSEU NEGATIVE 10/15/2017 2237   HGBUR LARGE (A) 10/15/2017 2237   BILIRUBINUR NEGATIVE 10/15/2017 2237   KETONESUR 5 (A) 10/15/2017 2237   PROTEINUR 30 (A) 10/15/2017 2237   UROBILINOGEN 1.0 05/18/2015 1407   NITRITE NEGATIVE 10/15/2017 2237   LEUKOCYTESUR LARGE (A) 10/15/2017 2237   Sepsis Labs: @LABRCNTIP (procalcitonin:4,lacticidven:4) )No results found for this or any previous visit (from the past 240 hour(s)).   Radiological Exams on Admission: Dg Chest Portable 1 View  Result Date: 10/15/2017 CLINICAL DATA:  Elevated white blood cell count. EXAM: PORTABLE CHEST 1 VIEW COMPARISON:  Radiograph 07/06/2017.  CT  09/10/2016 FINDINGS: Patient rotated to the right. Similar cardiomegaly to prior exam. Increased retrocardiac opacity and left pleural effusion. Right paratracheal density likely confluent vessels as seen on prior CT. No pulmonary edema. No pneumothorax. IMPRESSION: Left pleural effusion with increased retrocardiac opacity from prior exam,  may be compressive atelectasis or pneumonia in the setting of elevated white blood cell count. Cardiomegaly is similar. Electronically Signed   By: Jeb Levering M.D.   On: 10/15/2017 22:51     EKG: not down in ED yet, will get one  Assessment/Plan Principal Problem:   UTI (urinary tract infection) Active Problems:   Chronic diastolic CHF (congestive heart failure) (HCC)   PAF (paroxysmal atrial fibrillation) (HCC)   Stroke (cerebrum) (HCC)   HTN (hypertension)   Acute renal failure superimposed on stage 4 chronic kidney disease (HCC)   Iron deficiency anemia due to chronic blood loss   Hyperkalemia   Type II diabetes mellitus with renal manifestations (HCC)   GERD (gastroesophageal reflux disease)   CAD (coronary artery disease)   Sepsis ([place])   Sacral decubitus ulcer, stage IV (HCC)   UTI and sepsis: Patient meets criteria for sepsis with leukocytosis, tachypnea. Pending lactic acid. Blood pressure is soft, but hemodynamically stable currently. Patient had positive urine culture for Proteus mirabilis on 08/05/17. CXR showed retrocardiac opacity, but pt does not have for respiratory symptoms, less likely to have a pneumonia clinically.  - Admit to telemetry bed as inpt -  Ceftriaxone by IV - Follow up results of urine and blood cx and amend antibiotic regimen if needed per sensitivity results - prn Zofran for nausea - will get Procalcitonin and trend lactic acid levels per sepsis protocol. - IVF: 2L of NS bolus in ED, followed by 50 cc/h (patient has a congestive heart failure, limiting aggressive IV fluids treatment). - f/u Bx and Ux - IV  access issue: Currently patient has IO, may need to get PICC line in AM. - change indwelling Foley  Chronic diastolic CHF (congestive heart failure) ([place]):  No leg edema. CHF seems to be compensated. 2-D echo on 08/05/17 showed EF of 65-60% -Check BNP  PAF and Atrial flutter: CHA2DS2-VASc Score is 7, needs oral anticoagulation, but pt is not a good candidate [facility] due to history of GI bleeding. Heart rate is well controlled. -tele monitoring  HTN: -hold clonidine, amlodipine and oral hydralazine due to soft blood pressure secondary to sepsis. Patient is at risk of developing at hypotension -When necessary IV hydralazine  gerd: -protonix   AoCKD-IV: Baseline Cre is 2.8-3.2, pt's Cre is 4.61 and BUN 94on admission. Likely due to prerenal secondary to dehydration and UTI - IVF as above - Follow up renal function by BMP  Iron deficiency anemia due to chronic blood loss: -Continue iron supplement  Hyperkalemia: K=6.0, no EKG change -pt was treated with NovoLog, D50 and IV Lasix for hyperkalemia in ED. -will give Kayexalate 30 g 1   Type II diabetes mellitus with renal manifestations with CKD-IV: Last A1c 6.0 on 08/05/17, well controled. Patient is taking Lantus at home -will decrease Lantus dose from 15 to 10 unit daily  -SSI  Depression: -Remeron  Hx of CAD (coronary artery disease):  -continue lipitor and ASA   Hx of stroke: -ASA and lipitor  Sacral decubitus ulcer, stage IV ([place]): -wound care consult  Hyperkalemia: K=5.8 on admission. - Kayexalate 15 g 1 - check EKG   DVT ppx: SQ Heparin          Code Status: Full code Family Communication: None at bed side. Disposition Plan:  Anticipate discharge back to previous SNF environment Consults called: none Admission status:  Inpatient/tele     Date of Service 10/16/2017    Ivor Costa [facility] Pager [phone]  If 7PM-7AM,  please contact night-coverage [email or web address] Password [email or web address] 10/16/2017, 3:10  AM

## 2017-10-16 NOTE — ED Notes (Signed)
IO placed in pt R tibia by Dr Christy Gentles

## 2017-10-16 NOTE — Consult Note (Signed)
[place] Nurse wound consult note Reason for Consult: pressure injuries Patient from SNF, non verbal, no family in the room Cachetic 84 yo in ED, Albumin 2.5/Total protein 5.8  Wound type: Stage 4 Pressure injury: sacrum Unstageable Pressure injury: right heel Unstageable Pressure injury: left heel Full thickness ulceration: right medial malleolus   Pressure Injury POA: Yes  Measurement: Sacrum: 6cm x 5cm x 3cm x undermining 6-12olcock 2.0cm  Left lateral heel: 7cm x 4cm x 0.1cm  Right medial malleolus: 2.0cm x 1.5cm x 0.1cm  Right medial heel: 3.5cm x 5cm x 0cm   Wound bed: Sacrum: 100% non viable/grey/yellow slough with palpable bone Left lateral heel: 20% black stable eschar/80% pink, red Right medial malleolus: 90% yellow/10% pink Right medial heel: 100% black stable eschar  Drainage (amount, consistency, odor) none noted from the foot wounds for the most part. Sacrum: moderate, purulent with odor   Periwound:intact   Dressing procedure/placement/frequency: Needs debridement of the sacral wound but in current state surgery would most likely not want to perform a surgical debridement in the OR. I have notified hospitalist of same  PT for hydrotherapy for the sacral wound, to start tomorrow or when patient is admitted to regular room. And GOC can be addressed  LALM (low air loss mattress)for pressure redistribution and moisture management  Betadine to stable heel ulcerations, due to location best practice to offload and keep stable if possible, Prevelon boots ordered bilaterally.  Foam dressing to protect the right ankle wound.   Maximize nutrition, which is unlikely. Wounds would not heal with current nutritional status.   [place] Nurse team will follow along with you for weekly wound assessments.  Please notify me of any acute changes in the wounds or any new areas of concerns [place] MSN, RN,CWOCN, [place], [place]

## 2017-10-16 NOTE — ED Notes (Signed)
IV team and Md unable to get IV; Fem-stick obtained for labs.

## 2017-10-16 NOTE — Progress Notes (Signed)
Patient seen and examined this morning, admitted overnight by Dr. Blaine Hamper, H&P reviewed and agree with the assessment and plan.  In brief, this is a 84 year old male with history of a flutter/fib, not on anticoagulation because of GI bleed, anemia, chronic diastolic CHF, chronic kidney disease stage IV, coronary artery disease, prior CVA essentially bedbound and minimally verbal who presents with abnormal renal function and leukocytosis from his SNF.   Sepsis likely due to urinary tract infection -Patient started on ceftriaxone, continue -Urine cultures and blood cultures are pending  Chronic diastolic CHF -Seems to be compensated, 2D echo in December 2018 showed an EF of 6 60%  PAF and a flutter -Not a good candidate for anti-cognition due to history of GI bleed, continue rate control  Hypertension -Hold clonidine, Norvasc and oral hydralazine  Acute kidney injury on chronic kidney disease stage IV -Baseline creatinine  Prior CVAs -Patient minimally verbal, essentially bedbound  Stage IV sacral decubitus ulcer -Wound care consulted, wound appears necrotic with potentially needing debridement, I am not sure if he is a good surgical candidate.  I have been trying to reach the family to discuss goals of care, has 2 contacts listed Sharlyn Bologna, nephew, [phone] and Demarus Latterell [phone], no answer, will try again.  May benefit from palliative care consultation -For now hydrotherapy and antibiotics  Anemia in the setting of chronic kidney disease -Hemoglobin stable, monitor, no bleeding   August Gosser M. Cruzita Lederer, MD [facility] [phone]  If 7PM-7AM, please contact night-coverage [email or web address] Password [email or web address]

## 2017-10-16 NOTE — ED Notes (Signed)
Pt bed linens changed, pt dressing changed. Pt to be placed on air mattress.

## 2017-10-17 DIAGNOSIS — A419 Sepsis, unspecified organism: Principal | ICD-10-CM

## 2017-10-17 LAB — BASIC METABOLIC PANEL
ANION GAP: 17 — AB (ref 5–15)
BUN: 82 mg/dL — AB (ref 6–20)
CHLORIDE: 112 mmol/L — AB (ref 101–111)
CO2: 11 mmol/L — ABNORMAL LOW (ref 22–32)
Calcium: 7.9 mg/dL — ABNORMAL LOW (ref 8.9–10.3)
Creatinine, Ser: 4.11 mg/dL — ABNORMAL HIGH (ref 0.61–1.24)
GFR calc Af Amer: 14 mL/min — ABNORMAL LOW (ref >60)
GFR, EST NON AFRICAN AMERICAN: 12 mL/min — AB (ref >60)
Glucose, Bld: 86 mg/dL (ref 65–99)
POTASSIUM: 3.4 mmol/L — AB (ref 3.5–5.1)
SODIUM: 140 mmol/L (ref 135–145)

## 2017-10-17 LAB — CBC
HEMATOCRIT: 27.9 % — AB (ref 39.0–52.0)
Hemoglobin: 8.8 g/dL — ABNORMAL LOW (ref 13.0–17.0)
MCH: 27.8 pg (ref 26.0–34.0)
MCHC: 31.5 g/dL (ref 30.0–36.0)
MCV: 88.3 fL (ref 78.0–100.0)
Platelets: 167 10*3/uL (ref 150–400)
RBC: 3.16 MIL/uL — ABNORMAL LOW (ref 4.22–5.81)
RDW: 16.8 % — ABNORMAL HIGH (ref 11.5–15.5)
WBC: 13.4 10*3/uL — AB (ref 4.0–10.5)

## 2017-10-17 LAB — GLUCOSE, CAPILLARY
GLUCOSE-CAPILLARY: 54 mg/dL — AB (ref 65–99)
GLUCOSE-CAPILLARY: 146 mg/dL — AB (ref 65–99)
Glucose-Capillary: 69 mg/dL (ref 65–99)
Glucose-Capillary: 77 mg/dL (ref 65–99)

## 2017-10-17 LAB — MRSA PCR SCREENING: MRSA by PCR: NEGATIVE

## 2017-10-17 MED ORDER — METRONIDAZOLE IN NACL 5-0.79 MG/ML-% IV SOLN
500.0000 mg | Freq: Three times a day (TID) | INTRAVENOUS | Status: DC
  Administered 2017-10-17 – 2017-10-19 (×6): 500 mg via INTRAVENOUS
  Filled 2017-10-17 (×6): qty 100

## 2017-10-17 MED ORDER — DEXTROSE 50 % IV SOLN
INTRAVENOUS | Status: AC
  Administered 2017-10-17: 50 mL
  Filled 2017-10-17: qty 50

## 2017-10-17 NOTE — Progress Notes (Signed)
PT Cancellation Note  Patient Details Name: [name] MRN: [id number] DOB: 1933/10/06   Cancelled Treatment:    Reason Eval/Treat Not Completed: (P) Medical issues which prohibited therapy Hydrotherapy order postponed until 10/18/17. Thanks, Dani Gobble. Migdalia Dk PT, DPT Acute Rehabilitation  [phone] Pager [phone]     [place] 10/17/2017, 10:09 AM

## 2017-10-17 NOTE — Clinical Social Work Note (Signed)
Clinical Social Work Assessment  Patient Details  Name: [name] MRN: [id number] Date of Birth: Aug 09, 1934  Date of referral:  10/17/17               Reason for consult:  Facility Placement, Discharge Planning                Permission sought to share information with:  Family Supports, Other Permission granted to share information::  No(Patient oriented to person only per nursing)  Name::     Belva Chimes and [place]::     Relationship::  Close friend and daughter  Contact Information:  [phone] - Mr. Tessie Fass; [phone] - daughter  Housing/Transportation Living arrangements for the past 2 months:  Skilled Nursing Facility(From [facility] (formerly Environmental consultant)) Source of Information:  Engineer, materials, Other (Comment Required)(Facility liasion [facility]) Patient Interpreter Needed:  None Criminal Activity/Legal Involvement Pertinent to Current Situation/Hospitalization:  No - Comment as needed Significant Relationships:  Adult Children, Friend Lives with:  [place]) Do you feel safe going back to the place where you live?  Yes Need for family participation in patient care:  Yes (Comment)(Close friend - Mr. Hood very involved)  Care giving concerns:  Mr. Tessie Fass reported that she has had some concerns about patient's care at [facility], however Mr. Marez will return to this facility at discharge, and he may look into another facility he is interested in and this was discussed.  Social Worker assessment / plan:  CSW attempted to reach patient's daughter Khairi Garman and message left. Call also made to Mr. Hood and he returned the call. Mr. Tessie Fass refers to patient as his "uncle" as his father and patient are close friends. Mr. Tessie Fass reported that he handles all of patient's business. He reported that patient's family does not help him and daughter Lynelle Smoke is not involved with her father as she has health issues of his  own. Mr. Soward indicated that patient used to live with [facility], but it was not the proper environment for him. Mr. Tessie Fass also reported that Mr. Shaler lived with him for awhile but he could not meet his needs, so he went to [facility], however they could not adequately meet his needs and patient eventually went to [facility] (now [facility] Mr. Tessie Fass indicated that he went to Social Services to apply for Medicaid for patient.  Mr. Tessie Fass reported that patient has been a resident at [facility] for about 8/9 months.  Employment status:  Retired Nurse, adult, Medicaid In [place] PT Recommendations:  Not assessed at this time [place] / Referral to community resources:  [facility](Information not needed or requested as patient from SNF)  Patient/Family's Response to care:  Mr. Tessie Fass reported no concerns regarding patient's care during hospitalization.  Patient/Family's Understanding of and Emotional Response to Diagnosis, Current Treatment, and Prognosis:  Mr. Tessie Fass appeared knowledgeable regarding patient's need for being in a nursing facility and indicated concern that Mr. Banghart is receiving at the SNF.  Emotional Assessment Appearance:  Appears stated age(Attempted to talk with patient on 2/22, however patient appeared to be asleep and did not awaken when his name was called.) Attitude/Demeanor/Rapport:  Unable to Assess Affect (typically observed):  Unable to Assess Orientation:  Oriented to Self Alcohol / Substance use:  Tobacco Use, Alcohol Use, Illicit Drugs(Per H&P, patient quit smoking in 2000 and does not drink or use illicit drugs) Psych involvement (Current and /or in the  community):  No (Comment)  Discharge Needs  Concerns to be addressed:  Discharge Planning Concerns Readmission within the last 30 days:  No Current discharge risk:  None Barriers to Discharge:  Continued Medical Work up   [employer] Mila Homer, [place] 10/17/2017, 2:33 PM

## 2017-10-17 NOTE — Progress Notes (Signed)
Palliative Medicine RN Note: Consult order noted for [place] & unable to reach family.   Attempted to call the daughter multiple times; phone was picked up, but no one spoke or answered questions, and I was unable to leave a voicemail.   I was able to leave a voicemail at the phone number listed for the friend Coralyn Mark & requested a call back to the PMT number.  GOC cannot be addressed in the absence of a surrogate decision maker. If family remains elusive, recommend SW consult for due diligence/family location, at which point [place] can be discussed.   PMT will monitor the chart for contact with the family. There is, currently, not an available PMT provider due to high referral volume. When there is an available provider and if family is located, we can meet with them at that time.  Marjie Skiff Toy Samarin, RN, BSN, [facility] Palliative Medicine Team 10/17/2017 1:24 PM Office [phone]

## 2017-10-17 NOTE — Progress Notes (Addendum)
Palliative Medicine RN Note: Spoke with friend Belva Chimes. He has known Mr Gallier his whole life. Coralyn Mark reports that he has family members, but they will not participate in Mr Woolf's care; this is consistent with the multiple attempts to contact family without success.  Based on the above information, patient was discussed with Dr Hilma Favors, Medical Director for [facility]. Per [place] law, Coralyn Mark would be the surrogate in the absence of available family.  Coralyn Mark has been the caregiver for "[facility]" for years. He verbalized a lot of frustration with the facility, concerned that he isn't being changed/turned/cared for. Coralyn Mark also states that he's scared of every phone call, that it may be a call that [facility] has died.   Coralyn Mark is absolutely willing to assist with any decisions and talk about Billy's illnesses. His cell phone number is [phone]. 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 He said he doesn't work over the weekend and should be readily available on his cell.   PMT will see him as soon as we have an available provider this weekend.  Marjie Skiff Rigby Swamy, RN, BSN, [facility] Palliative Medicine Team 10/17/2017 3:50 PM Office [phone]

## 2017-10-17 NOTE — Progress Notes (Addendum)
PROGRESS NOTE  [name] QQP:[id number] DOB: 03-Jul-1934 DOA: 10/15/2017 PCP: [name], PA   LOS: 1 day   Brief Narrative / Interim history: 84 year old male with history of a flutter/fib, not on anticoagulation because of GI bleed, anemia, chronic diastolic CHF, chronic kidney disease stage IV, coronary artery disease, prior CVA essentially bedbound and minimally verbal who presents with abnormal renal function and leukocytosis from his SNF.  Assessment & Plan: Principal Problem:   UTI (urinary tract infection) Active Problems:   Chronic diastolic CHF (congestive heart failure) (HCC)   PAF (paroxysmal atrial fibrillation) (HCC)   Stroke (cerebrum) (HCC)   HTN (hypertension)   Acute renal failure superimposed on stage 4 chronic kidney disease (HCC)   Iron deficiency anemia due to chronic blood loss   Hyperkalemia   Type II diabetes mellitus with renal manifestations (HCC)   GERD (gastroesophageal reflux disease)   CAD (coronary artery disease)   Sepsis ([place])   Sacral decubitus ulcer, stage IV (HCC)   Sepsis likely due to urinary tract infection -Patient started on ceftriaxone, continue -Urine cultures unfortunately auto-cancelled by lab, and blood cultures are pending  Chronic diastolic CHF -Seems to be compensated, 2D echo in December 2018 showed an EF of 60%  PAF and a flutter -Not a good candidate for anti-cognition due to history of GI bleed, continue rate control  Hypertension -Hold clonidine, Norvasc and oral hydralazine  Acute kidney injury on chronic kidney disease stage IV -Cr improving, continue fluids   Prior CVAs -Patient minimally verbal, essentially bedbound -consult SLP today, dietician   Stage IV sacral decubitus ulcer -Wound care consulted, wound appears necrotic with potentially needing debridement, I am not sure if he is a good surgical candidate.  I have been trying to reach the family to discuss goals of care, has 2 contacts listed  Sharlyn Bologna, nephew, [phone] and Romar Woodrick [phone], no answer, will try again.  -For now hydrotherapy and antibiotics, add metronidazole today for anaerobic coverage -general surgery consulted -palliative consulted as well   Anemia in the setting of chronic kidney disease -Hemoglobin stable, monitor, no bleeding    DVT prophylaxis: heparin Code Status: Full code Family Communication: unable to reach family members, tried to call Sharlyn Bologna, nephew, [phone] and Kinsey Cowsert [phone]   Disposition Plan: SNF when ready   Consultants:   Palliative  General surgery   Procedures:   Hydrotherapy   Antimicrobials:  Ceftriaxone 2/21 >>   Subjective: -not answering any of my questions. Appears comfortable   Objective: Vitals:   10/16/17 1801 10/16/17 2216 10/17/17 0425 10/17/17 0950  BP: 103/64 110/62 (!) 99/53 (!) 111/48  Pulse: 62 77 72 68  Resp: 15  15 16   Temp: 98.1 F (36.7 C) 98.5 F (36.9 C) 98 F (36.7 C) 98 F (36.7 C)  TempSrc: Oral Oral Oral Oral  SpO2: 100% 97% 100% 100%  Weight:      Height:        Intake/Output Summary (Last 24 hours) at 10/17/2017 1241 Last data filed at 10/17/2017 0900 Gross per 24 hour  Intake 1405 ml  Output 525 ml  Net 880 ml   Filed Weights   10/15/17 2220  Weight: 90.7 kg (200 lb)    Examination:  Constitutional: NAD Eyes:  lids and conjunctivae normal ENMT: Mucous membranes are moist.  Respiratory: clear to auscultation bilaterally, no wheezing, no crackles. Cardiovascular: Regular rate and rhythm, no murmurs / rubs / gallops. No LE edema.  Abdomen: no tenderness. Bowel sounds  positive.  Skin: no rashes. Large sacral wound (picture in surgery note) Neurologic: moves all 4, not following commands    Data Reviewed: I have independently reviewed following labs and imaging studies   CBC: Recent Labs  Lab 10/16/17 0026 10/16/17 0430 10/17/17 0416  WBC 22.3* 21.0* 13.4*  NEUTROABS  19.8*  --   --   HGB 9.5* 9.5* 8.8*  HCT 28.6* 30.5* 27.9*  MCV 87.5 88.2 88.3  PLT 241 178 623   Basic Metabolic Panel: Recent Labs  Lab 10/16/17 0026 10/16/17 0430 10/17/17 0416  NA 135 136 140  K 5.8* 4.6 3.4*  CL 109 107 112*  CO2 9* 11* 11*  GLUCOSE 112* 96 86  BUN 94* 93* 82*  CREATININE 4.61* 4.57* 4.11*  CALCIUM 8.1* 8.0* 7.9*   GFR: Estimated Creatinine Clearance: 15.5 mL/min (A) (by C-G formula based on SCr of 4.11 mg/dL (H)). Liver Function Tests: Recent Labs  Lab 10/16/17 0026  AST 12*  ALT <5*  ALKPHOS 110  BILITOT 1.5*  PROT 5.8*  ALBUMIN 2.5*   No results for input(s): LIPASE, AMYLASE in the last 168 hours. No results for input(s): AMMONIA in the last 168 hours. Coagulation Profile: No results for input(s): INR, PROTIME in the last 168 hours. Cardiac Enzymes: No results for input(s): CKTOTAL, CKMB, CKMBINDEX, TROPONINI in the last 168 hours. BNP (last 3 results) No results for input(s): PROBNP in the last 8760 hours. HbA1C: No results for input(s): HGBA1C in the last 72 hours. CBG: Recent Labs  Lab 10/16/17 1734 10/17/17 1237  GLUCAP 98 69   Lipid Profile: No results for input(s): CHOL, HDL, LDLCALC, TRIG, CHOLHDL, LDLDIRECT in the last 72 hours. Thyroid Function Tests: No results for input(s): TSH, T4TOTAL, FREET4, T3FREE, THYROIDAB in the last 72 hours. Anemia Panel: No results for input(s): VITAMINB12, FOLATE, FERRITIN, TIBC, IRON, RETICCTPCT in the last 72 hours. Urine analysis:    Component Value Date/Time   COLORURINE YELLOW 10/15/2017 2237   APPEARANCEUR CLOUDY (A) 10/15/2017 2237   LABSPEC 1.017 10/15/2017 2237   PHURINE 5.0 10/15/2017 2237   GLUCOSEU NEGATIVE 10/15/2017 2237   HGBUR LARGE (A) 10/15/2017 2237   BILIRUBINUR NEGATIVE 10/15/2017 2237   KETONESUR 5 (A) 10/15/2017 2237   PROTEINUR 30 (A) 10/15/2017 2237   UROBILINOGEN 1.0 05/18/2015 1407   NITRITE NEGATIVE 10/15/2017 2237   LEUKOCYTESUR LARGE (A) 10/15/2017 2237    Sepsis Labs: Invalid input(s): PROCALCITONIN, LACTICIDVEN  Recent Results (from the past 240 hour(s))  MRSA PCR Screening     Status: None   Collection Time: 10/17/17  7:36 AM  Result Value Ref Range Status   MRSA by PCR NEGATIVE NEGATIVE Final    Comment:        The GeneXpert MRSA Assay (FDA approved for NASAL specimens only), is one component of a comprehensive MRSA colonization surveillance program. It is not intended to diagnose MRSA infection nor to guide or monitor treatment for MRSA infections. Performed at [facility], [place] [address], [place], [place] [address]       Radiology Studies: Dg Chest Portable 1 View  Result Date: 10/15/2017 CLINICAL DATA:  Elevated white blood cell count. EXAM: PORTABLE CHEST 1 VIEW COMPARISON:  Radiograph 07/06/2017.  CT 09/10/2016 FINDINGS: Patient rotated to the right. Similar cardiomegaly to prior exam. Increased retrocardiac opacity and left pleural effusion. Right paratracheal density likely confluent vessels as seen on prior CT. No pulmonary edema. No pneumothorax. IMPRESSION: Left pleural effusion with increased retrocardiac opacity from prior  exam, may be compressive atelectasis or pneumonia in the setting of elevated white blood cell count. Cardiomegaly is similar. Electronically Signed   By: Jeb Levering M.D.   On: 10/15/2017 22:51     Scheduled Meds: . aspirin  325 mg Oral Daily  . atorvastatin  20 mg Oral q1800  . collagenase   Topical Daily  . ferrous sulfate  325 mg Oral TID WC  . fluticasone  2 spray Each Nare Daily  . heparin  5,000 Units Subcutaneous Q8H  . insulin glargine  10 Units Subcutaneous Q2200  . mirtazapine  7.5 mg Oral QHS  . multivitamin  1 tablet Oral QHS  . pantoprazole  40 mg Oral Q0600  . risperiDONE  1 mg Oral QHS  . senna  2 tablet Oral Q8H   Continuous Infusions: . sodium chloride 50 mL/hr at 10/16/17 1825  . cefTRIAXone (ROCEPHIN)  IV 1 g (10/17/17 0200)     Marzetta Board, MD,  PhD [facility] Pager [phone]  If 7PM-7AM, please contact night-coverage [email or web address] Password [facility] 10/17/2017, 12:41 PM

## 2017-10-17 NOTE — NC FL2 (Signed)
[place] LEVEL OF CARE SCREENING TOOL     IDENTIFICATION  Patient Name: [name] Birthdate: 10-27-1933 Sex: male Admission Date (Current Location): 10/15/2017  [facility] and [place] Number:  Herbalist and Address:  [facility] [place], [place] [address], [place], [place] [address]      Provider Number: [id number]  Attending Physician Name and Address:  Caren Griffins, MD  Relative Name and Phone Number:  Hansel Starling - [phone]; Daughter Roben Schliep - [phone]    Current Level of Care: Hospital Recommended Level of Care: Skilled Nursing Facility(Patient from [facility] Prior Approval Number:    Date Approved/Denied:   PASRR Number: [id number] A(eff. 06/22/12)  Discharge Plan: SNF    Current Diagnoses: Patient Active Problem List   Diagnosis Date Noted  . UTI (urinary tract infection) 10/16/2017  . Sepsis ([place]) 10/16/2017  . Sacral decubitus ulcer, stage IV ([place]) 10/16/2017  . Hyperkalemia 08/05/2017  . Type II diabetes mellitus with renal manifestations ([place]) 08/05/2017  . GERD (gastroesophageal reflux disease) 08/05/2017  . Depression 08/05/2017  . CAD (coronary artery disease) 08/05/2017  . TIA (transient ischemic attack) 08/05/2017  . Atrial flutter, chronic ([place])   . Chronic renal failure, stage 4 (severe) (HCC)   . Anger reaction 12/04/2016  . Flat affect 12/04/2016  . Iron deficiency anemia due to chronic blood loss 12/02/2016  . GI bleeding 11/27/2016  . Acute renal failure superimposed on stage 4 chronic kidney disease ([place]) 11/27/2016  . Pressure injury of skin 09/18/2016  . Leukocytosis   . Mobitz type 1 second degree AV block 09/11/2016  . Pericardial effusion- moderate on CT scan 09/11/2016  . HTN (hypertension) 09/05/2016  . Acute urinary retention 09/05/2016  . Severe obesity (BMI >= 40) ([place]) 09/05/2016  . Stroke (cerebrum) ([place]) 12/09/2015  .  Bradycardia 12/08/2015  . Fall 12/08/2015  . Pancytopenia ([place]) 12/08/2015  . Atrial flutter ([place]) 08/29/2015  . Hypoxia 08/28/2015  . PAF (paroxysmal atrial fibrillation) ([place])   . Noncompliance with medications 08/03/2015  . Chronic diastolic CHF (congestive heart failure) ([place]) 07/31/2015  . Cellulitis of left lower extremity 07/31/2015  . Uncontrolled diabetes mellitus with diabetic nephropathy, with long-term current use of insulin ([place]) 07/31/2015  . Accelerated hypertension 07/31/2015  . Thrombocytopenia ([place]) 07/31/2015  . Lymphedema 06/25/2011    Orientation RESPIRATION BLADDER Height & Weight     Self  Normal External catheter(Catheter present on admission) Weight: 200 lb (90.7 kg) Height:  6' (182.9 cm)  BEHAVIORAL SYMPTOMS/MOOD NEUROLOGICAL BOWEL NUTRITION STATUS      Continent Diet(Heart healthy - Carb modified)  AMBULATORY STATUS COMMUNICATION OF NEEDS Skin   Total Care(Patient has chronic ontractures of RLE and LLE and is bed-bound) Verbally Other (Comment)(Stage 4 pressure ulcer to sacrum; Unstageable pressure injury to right heel, treated with foam dressing; Unstageable pessure injury to left heel; full thickness ulceration of right medial malleolus)                       Personal Care Assistance Level of Assistance  Bathing, Feeding, Dressing Bathing Assistance: Maximum assistance Feeding assistance: Independent Dressing Assistance: Maximum assistance     Functional Limitations Info  Sight, Hearing, Speech Sight Info: Adequate Hearing Info: Adequate Speech Info: Adequate(Patient is minimally verbal)    [place]  Contractures Contractures Info: Present(RLE and LLE)    Additional Factors Info  Code Status, Allergies, Isolation Precautions Code Status Info: Full Allergies Info: Metformin and related     Isolation Precautions Info: MRSA PCR screening of nares (2/22) negative     Current  Medications (10/17/2017):  This is the current hospital active medication list Current Facility-Administered Medications  Medication Dose Route Frequency Provider Last Rate Last Dose  . 0.9 %  sodium chloride infusion   Intravenous Continuous Ivor Costa, MD 50 mL/hr at 10/16/17 1825    . acetaminophen (TYLENOL) tablet 650 mg  650 mg Oral Q6H PRN Ivor Costa, MD       Or  . acetaminophen (TYLENOL) suppository 650 mg  650 mg Rectal Q6H PRN Ivor Costa, MD      . aspirin EC tablet 325 mg  325 mg Oral Daily Ivor Costa, MD   325 mg at 10/17/17 1127  . atorvastatin (LIPITOR) tablet 20 mg  20 mg Oral q1800 Ivor Costa, MD   20 mg at 10/16/17 2226  . benzonatate (TESSALON) capsule 100 mg  100 mg Oral Q6H PRN Ivor Costa, MD      . cefTRIAXone (ROCEPHIN) 1 g in sodium chloride 0.9 % 100 mL IVPB  1 g Intravenous Q24H Ivor Costa, MD 200 mL/hr at 10/17/17 0200 1 g at 10/17/17 0200  . collagenase (SANTYL) ointment   Topical Daily Marzetta Board M, MD      . ferrous sulfate tablet 325 mg  325 mg Oral TID WC Ivor Costa, MD   325 mg at 10/17/17 1127  . fluticasone (FLONASE) 50 MCG/ACT nasal spray 2 spray  2 spray Each Nare Daily Ivor Costa, MD   2 spray at 10/16/17 1642  . heparin injection 5,000 Units  5,000 Units Subcutaneous Q8H Ivor Costa, MD   5,000 Units at 10/17/17 1343  . hydrALAZINE (APRESOLINE) injection 5 mg  5 mg Intravenous Q2H PRN Ivor Costa, MD      . HYDROcodone-acetaminophen (NORCO/VICODIN) 5-325 MG per tablet 1 tablet  1 tablet Oral Q6H PRN Ivor Costa, MD   1 tablet at 10/16/17 2227  . insulin glargine (LANTUS) injection 10 Units  10 Units Subcutaneous Q2200 Ivor Costa, MD   10 Units at 10/16/17 2311  . metroNIDAZOLE (FLAGYL) IVPB 500 mg  500 mg Intravenous Q8H Caren Griffins, MD   Stopped at 10/17/17 1444  . mirtazapine (REMERON) tablet 7.5 mg  7.5 mg Oral QHS Ivor Costa, MD   7.5 mg at 10/16/17 2226  . multivitamin (RENA-VIT) tablet 1 tablet  1 tablet Oral QHS Ivor Costa, MD   1 tablet at  10/16/17 2227  . ondansetron (ZOFRAN) tablet 4 mg  4 mg Oral Q6H PRN Ivor Costa, MD       Or  . ondansetron (ZOFRAN) injection 4 mg  4 mg Intravenous Q6H PRN Ivor Costa, MD      . pantoprazole (PROTONIX) EC tablet 40 mg  40 mg Oral Q0600 Ivor Costa, MD   40 mg at 10/17/17 1343  . risperiDONE (RISPERDAL) tablet 1 mg  1 mg Oral QHS Ivor Costa, MD   1 mg at 10/16/17 2310  . senna (SENOKOT) tablet 17.2 mg  2 tablet Oral Q8H Ivor Costa, MD   17.2 mg at 10/17/17 1343  . zolpidem (AMBIEN) tablet 5 mg  5 mg Oral QHS PRN Ivor Costa, MD   5 mg at 10/16/17 2227     Discharge Medications: Please see discharge summary for a  list of discharge medications.  Relevant Imaging Results:  Relevant Lab Results:   Additional Information ss#[id number].  Patient had an hydrotherapy order for 2/23.   Sable Feil, LCSW

## 2017-10-17 NOTE — Consult Note (Signed)
[facility] Surgery Consult Note  [name] 1934-06-03  [id number].    Requesting MD: Cruzita Lederer Chief Complaint/Reason for Consult: sacral decubitus ulcer HPI:  Patient is an 84 y/o male admitted from SNF. Patient admitted with sepsis and UTI. Positive urine cx for proteus mirabilis on 08/05/17, patient has chronic indwelling foley catheter. In ED patient found to havepositive urinalysis for UTI, WBC 22.3, potassium of 5.8, bicarbonate 9, creatinine 4.61, BUN 94, temperature normal, no tachycardia, has tachypnea, oxygen saturation 100% on room air. Chest x-ray showed a left pleural effusion and retrocardial opacity. Patient also with a chronic stage IV pressure ulcer over sacrum, WOC consulted and recommended hydrotherapy and surgical consult.   Patient with multiple other medical problems including Hx of CVA and currently non-verbal, chronic diastolic CHF, PAF and A flutter, Hx of CAD, HTN, GERD, CKD stage IV with superimposed AKI, IDA, Depression.   ROS: Review of Systems  Unable to perform ROS: Patient nonverbal    Family History  Problem Relation Age of Onset  . Brain cancer Mother   . Diabetes type II Sister   . Narcolepsy Paternal Uncle     Past Medical History:  Diagnosis Date  . Atrial fibrillation ([place])   . Atrial flutter ([place])    hx/notes 11/28/2016  . CHF (congestive heart failure) ([place])   . Chronic diastolic CHF (congestive heart failure) ([place])    hx/notes 11/28/2016  . CKD (chronic kidney disease), stage IV ([place])    hx/notes 11/28/2016  . Colon cancer ([place])    hx/notes 11/28/2016  . Coronary artery disease   . Depression    hx/notes 11/28/2016  . Diabetic neuropathy ([place])    hx/notes 11/28/2016  . GI bleeding 11/27/2016   notes 11/28/2016  . History of blood transfusion 11/28/2016   hx/notes 11/28/2016  . Hypertension   . IDDM (insulin dependent diabetes mellitus) ([place])    uncontrolled; hx/notes 11/28/2016    Past Surgical History:  Procedure Laterality Date    . COLONOSCOPY  06/21/2012   Procedure: COLONOSCOPY;  Surgeon: Juanita Craver, MD;  Location: [facility] ENDOSCOPY;  Service: Endoscopy;  Laterality: N/A;  . COLONOSCOPY Left 02/13/2014   Procedure: COLONOSCOPY;  Surgeon: Juanita Craver, MD;  Location: [facility] ENDOSCOPY;  Service: Endoscopy;  Laterality: Left;  . ESOPHAGOGASTRODUODENOSCOPY  06/19/2012   Procedure: ESOPHAGOGASTRODUODENOSCOPY (EGD);  Surgeon: Beryle Beams, MD;  Location: [facility] ENDOSCOPY;  Service: Endoscopy;  Laterality: N/A;  . LAPAROSCOPIC RIGHT COLECTOMY Right 02/15/2014   Procedure: LAPAROSCOPIC ASISSTED RIGHT  COLECTOMY, POSSIBLE OPEN;  Surgeon: Rolm Bookbinder, MD;  Location: [place];  Service: General;  Laterality: Right;  . LEG SURGERY     Left leg surgery. broken leg    Social History:  reports that he quit smoking about 19 years ago. he has never used smokeless tobacco. He reports that he does not drink alcohol or use drugs.  Allergies:  Allergies  Allergen Reactions  . Metformin And Related Other (See Comments)    Reaction: pt states he thinks he threw up    Medications Prior to Admission  Medication Sig Dispense Refill  . acetaminophen (TYLENOL) 325 MG tablet Take 650 mg by mouth every 6 (six) hours.    Marland Kitchen amLODipine (NORVASC) 10 MG tablet Take 1 tablet (10 mg total) by mouth daily.    Marland Kitchen aspirin EC 325 MG EC tablet Take 1 tablet (325 mg total) by mouth daily. 30 tablet 0  . atorvastatin (LIPITOR) 20 MG tablet Take 1 tablet (20 mg total) by mouth daily  at 6 PM.    . b complex-vitamin c-folic acid (NEPHRO-VITE) 0.8 MG TABS tablet Take 1 tablet by mouth daily.    . benzonatate (TESSALON) 100 MG capsule Take 100 mg by mouth every 6 (six) hours as needed for cough.    . cloNIDine (CATAPRES - DOSED IN MG/24 HR) 0.1 mg/24hr patch Place 0.1 mg onto the skin once a week. Saturday    . ferrous sulfate 325 (65 FE) MG EC tablet Take 1 tablet (325 mg total) by mouth 3 (three) times daily with meals. 90 tablet 0  . fluticasone (FLONASE) 50  MCG/ACT nasal spray Place 2 sprays into both nostrils daily.    Marland Kitchen glucagon (GLUCAGON EMERGENCY) 1 MG injection Inject 1 mg into the vein once as needed (for low blood sugar).    . hydrALAZINE (APRESOLINE) 25 MG tablet Take 3 tablets (75 mg total) by mouth every 8 (eight) hours. (Patient taking differently: Take 25 mg by mouth every 8 (eight) hours. ) 30 tablet 0  . HYDROcodone-acetaminophen (NORCO/VICODIN) 5-325 MG tablet Take 1 tablet by mouth every 6 (six) hours as needed for moderate pain. 10 tablet 0  . Insulin Glargine (LANTUS SOLOSTAR) 100 UNIT/ML Solostar Pen Inject 15 Units into the skin daily at 10 pm. 5 pen PRN  . mirtazapine (REMERON) 7.5 MG tablet Take 7.5 mg by mouth at bedtime.    . pantoprazole (PROTONIX) 40 MG tablet Take 1 tablet (40 mg total) by mouth daily at 6 (six) AM. 30 tablet 0  . risperiDONE (RISPERDAL) 1 MG/ML oral solution Take 1 mg by mouth at bedtime.    . senna (SENOKOT) 8.6 MG TABS tablet Take 2 tablets by mouth every 8 (eight) hours.     Marland Kitchen UNABLE TO FIND Take 120 mLs by mouth 3 (three) times daily. Med Name: House Supplement      Blood pressure (!) 99/53, pulse 72, temperature 98 F (36.7 C), temperature source Oral, resp. rate 15, height 6' (1.829 m), weight 90.7 kg (200 lb), SpO2 100 %. Physical Exam: Physical Exam  Constitutional: He appears well-developed. No distress.  HENT:  Head: Normocephalic and atraumatic.  Right Ear: External ear normal.  Left Ear: External ear normal.  Nose: Nose normal.  Eyes: Conjunctivae and lids are normal. Pupils are equal, round, and reactive to light. No scleral icterus.  Neck: Neck supple.  Cardiovascular: Normal rate and regular rhythm.  Pulses:      Radial pulses are 1+ on the right side, and 1+ on the left side.       Dorsalis pedis pulses are 1+ on the right side, and 1+ on the left side.  Pulmonary/Chest: Effort normal and breath sounds normal. No stridor. He has no wheezes.  Abdominal: Soft. Bowel sounds are  normal. He exhibits no distension. There is no tenderness. There is no guarding.  Genitourinary:  Genitourinary Comments: Foley present  Musculoskeletal:  No gross deformities or amputations noted  Neurological: He is unresponsive.  Skin: He is not diaphoretic.  See picture below for stage IV sacral decubitus pressure wound; foul smelling with scant drainage, necrotic tissue present at wound base with surrounding erythema  Psychiatric: He has a normal mood and affect. His speech is normal and behavior is normal.  Vitals reviewed.     Results for orders placed or performed during the hospital encounter of 10/15/17 (from the past 48 hour(s))  Urinalysis, Routine w reflex microscopic     Status: Abnormal   Collection Time: 10/15/17 10:37 PM  Result Value Ref Range   Color, Urine YELLOW YELLOW   APPearance CLOUDY (A) CLEAR   Specific Gravity, Urine 1.017 1.005 - 1.030   pH 5.0 5.0 - 8.0   Glucose, UA NEGATIVE NEGATIVE mg/dL   Hgb urine dipstick LARGE (A) NEGATIVE   Bilirubin Urine NEGATIVE NEGATIVE   Ketones, ur 5 (A) NEGATIVE mg/dL   Protein, ur 30 (A) NEGATIVE mg/dL   Nitrite NEGATIVE NEGATIVE   Leukocytes, UA LARGE (A) NEGATIVE   RBC / HPF TOO NUMEROUS TO COUNT 0 - 5 RBC/hpf   WBC, UA TOO NUMEROUS TO COUNT 0 - 5 WBC/hpf   Bacteria, UA FEW (A) NONE SEEN   Squamous Epithelial / LPF 0-5 (A) NONE SEEN   WBC Clumps PRESENT    Mucus PRESENT     Comment: Performed at [facility], [place] [address], [place], [place] [address]  CBC with Differential/Platelet     Status: Abnormal   Collection Time: 10/16/17 12:26 AM  Result Value Ref Range   WBC 22.3 (H) 4.0 - 10.5 K/uL    Comment: REPEATED TO VERIFY WHITE COUNT CONFIRMED ON SMEAR    RBC 3.27 (L) 4.22 - 5.81 MIL/uL   Hemoglobin 9.5 (L) 13.0 - 17.0 g/dL   HCT 28.6 (L) 39.0 - 52.0 %   MCV 87.5 78.0 - 100.0 fL   MCH 29.1 26.0 - 34.0 pg   MCHC 33.2 30.0 - 36.0 g/dL   RDW 16.9 (H) 11.5 - 15.5 %   Platelets 241 150 - 400 K/uL     Comment: REPEATED TO VERIFY PLATELET COUNT CONFIRMED BY SMEAR    Neutrophils Relative % 89 %   Lymphocytes Relative 4 %   Monocytes Relative 7 %   Eosinophils Relative 0 %   Basophils Relative 0 %   Neutro Abs 19.8 (H) 1.7 - 7.7 K/uL   Lymphs Abs 0.9 0.7 - 4.0 K/uL   Monocytes Absolute 1.6 (H) 0.1 - 1.0 K/uL   Eosinophils Absolute 0.0 0.0 - 0.7 K/uL   Basophils Absolute 0.0 0.0 - 0.1 K/uL   RBC Morphology BURR CELLS    WBC Morphology MILD LEFT SHIFT (1-5% METAS, OCC MYELO, OCC BANDS)     Comment: Performed at [facility], [place] [address], [place], [place] [address]  Comprehensive metabolic panel     Status: Abnormal   Collection Time: 10/16/17 12:26 AM  Result Value Ref Range   Sodium 135 135 - 145 mmol/L   Potassium 5.8 (H) 3.5 - 5.1 mmol/L    Comment: HEMOLYSIS AT THIS LEVEL MAY AFFECT RESULT   Chloride 109 101 - 111 mmol/L   CO2 9 (L) 22 - 32 mmol/L   Glucose, Bld 112 (H) 65 - 99 mg/dL   BUN 94 (H) 6 - 20 mg/dL   Creatinine, Ser 4.61 (H) 0.61 - 1.24 mg/dL   Calcium 8.1 (L) 8.9 - 10.3 mg/dL   Total Protein 5.8 (L) 6.5 - 8.1 g/dL   Albumin 2.5 (L) 3.5 - 5.0 g/dL   AST 12 (L) 15 - 41 U/L    Comment: HEMOLYSIS AT THIS LEVEL MAY AFFECT RESULT   ALT <5 (L) 17 - 63 U/L    Comment: HEMOLYSIS AT THIS LEVEL MAY AFFECT RESULT   Alkaline Phosphatase 110 38 - 126 U/L   Total Bilirubin 1.5 (H) 0.3 - 1.2 mg/dL   GFR calc non Af Amer 11 (L) >60 mL/min   GFR calc Af Amer 12 (L) >60 mL/min  Comment: (NOTE) The eGFR has been calculated using the CKD EPI equation. This calculation has not been validated in all clinical situations. eGFR's persistently <60 mL/min signify possible Chronic Kidney Disease.    Anion gap 17 (H) 5 - 15    Comment: Performed at [facility], [place] [address] [place], [place] [address]  Brain natriuretic peptide     Status: Abnormal   Collection Time: 10/16/17  4:30 AM  Result Value Ref Range   B Natriuretic Peptide 119.5 (H) 0.0 - 100.0 pg/mL     Comment: Performed at [place] [address], [place], [place] [address]  Lactic acid, plasma     Status: None   Collection Time: 10/16/17  4:30 AM  Result Value Ref Range   Lactic Acid, Venous 1.1 0.5 - 1.9 mmol/L    Comment: Performed at [facility] [address], [place], [place] [address]  Procalcitonin     Status: None   Collection Time: 10/16/17  4:30 AM  Result Value Ref Range   Procalcitonin 5.29 ng/mL    Comment:        Interpretation: PCT > 2 ng/mL: Systemic infection (sepsis) is likely, unless other causes are known. (NOTE)       Sepsis PCT Algorithm           Lower Respiratory Tract                                      Infection PCT Algorithm    ----------------------------     ----------------------------         PCT < 0.25 ng/mL                PCT < 0.10 ng/mL         Strongly encourage             Strongly discourage   discontinuation of antibiotics    initiation of antibiotics    ----------------------------     -----------------------------       PCT 0.25 - 0.50 ng/mL            PCT 0.10 - 0.25 ng/mL               OR       >80% decrease in PCT            Discourage initiation of                                            antibiotics      Encourage discontinuation           of antibiotics    ----------------------------     -----------------------------         PCT >= 0.50 ng/mL              PCT 0.26 - 0.50 ng/mL               AND       <80% decrease in PCT              Encourage initiation of  antibiotics       Encourage continuation           of antibiotics    ----------------------------     -----------------------------        PCT >= 0.50 ng/mL                  PCT > 0.50 ng/mL               AND         increase in PCT                  Strongly encourage                                      initiation of antibiotics    Strongly encourage escalation           of antibiotics                                      -----------------------------                                           PCT <= 0.25 ng/mL                                                 OR                                        > 80% decrease in PCT                                     Discontinue / Do not initiate                                             antibiotics Performed at [facility], [address], [place], [place] [address]   Basic metabolic panel     Status: Abnormal   Collection Time: 10/16/17  4:30 AM  Result Value Ref Range   Sodium 136 135 - 145 mmol/L   Potassium 4.6 3.5 - 5.1 mmol/L    Comment: DELTA CHECK NOTED   Chloride 107 101 - 111 mmol/L   CO2 11 (L) 22 - 32 mmol/L   Glucose, Bld 96 65 - 99 mg/dL   BUN 93 (H) 6 - 20 mg/dL   Creatinine, Ser 4.57 (H) 0.61 - 1.24 mg/dL   Calcium 8.0 (L) 8.9 - 10.3 mg/dL   GFR calc non Af Amer 11 (L) >60 mL/min   GFR calc Af Amer 13 (L) >60 mL/min    Comment: (NOTE) The eGFR has been calculated using the CKD EPI equation. This calculation has not been validated in all clinical situations. eGFR's persistently <60 mL/min signify possible Chronic Kidney Disease.  Anion gap 18 (H) 5 - 15    Comment: Performed at [facility], [place] [address] [place], [place] [address]  CBC     Status: Abnormal   Collection Time: 10/16/17  4:30 AM  Result Value Ref Range   WBC 21.0 (H) 4.0 - 10.5 K/uL   RBC 3.46 (L) 4.22 - 5.81 MIL/uL   Hemoglobin 9.5 (L) 13.0 - 17.0 g/dL   HCT 30.5 (L) 39.0 - 52.0 %   MCV 88.2 78.0 - 100.0 fL   MCH 27.5 26.0 - 34.0 pg   MCHC 31.1 30.0 - 36.0 g/dL   RDW 16.6 (H) 11.5 - 15.5 %   Platelets 178 150 - 400 K/uL    Comment: Performed at [facility], [place] [address], [place], [place] [address]  CBG monitoring, ED     Status: None   Collection Time: 10/16/17  5:34 PM  Result Value Ref Range   Glucose-Capillary 98 65 - 99 mg/dL  Basic metabolic panel     Status: Abnormal   Collection Time: 10/17/17  4:16 AM  Result  Value Ref Range   Sodium 140 135 - 145 mmol/L   Potassium 3.4 (L) 3.5 - 5.1 mmol/L    Comment: DELTA CHECK NOTED   Chloride 112 (H) 101 - 111 mmol/L   CO2 11 (L) 22 - 32 mmol/L   Glucose, Bld 86 65 - 99 mg/dL   BUN 82 (H) 6 - 20 mg/dL   Creatinine, Ser 4.11 (H) 0.61 - 1.24 mg/dL   Calcium 7.9 (L) 8.9 - 10.3 mg/dL   GFR calc non Af Amer 12 (L) >60 mL/min   GFR calc Af Amer 14 (L) >60 mL/min    Comment: (NOTE) The eGFR has been calculated using the CKD EPI equation. This calculation has not been validated in all clinical situations. eGFR's persistently <60 mL/min signify possible Chronic Kidney Disease.    Anion gap 17 (H) 5 - 15    Comment: Performed at [facility], [place] [address] [place], [place] [address]  CBC     Status: Abnormal   Collection Time: 10/17/17  4:16 AM  Result Value Ref Range   WBC 13.4 (H) 4.0 - 10.5 K/uL   RBC 3.16 (L) 4.22 - 5.81 MIL/uL   Hemoglobin 8.8 (L) 13.0 - 17.0 g/dL   HCT 27.9 (L) 39.0 - 52.0 %   MCV 88.3 78.0 - 100.0 fL   MCH 27.8 26.0 - 34.0 pg   MCHC 31.5 30.0 - 36.0 g/dL   RDW 16.8 (H) 11.5 - 15.5 %   Platelets 167 150 - 400 K/uL    Comment: Performed at [facility], [place] [address], [place], [place] [address]   Dg Chest Portable 1 View  Result Date: 10/15/2017 CLINICAL DATA:  Elevated white blood cell count. EXAM: PORTABLE CHEST 1 VIEW COMPARISON:  Radiograph 07/06/2017.  CT 09/10/2016 FINDINGS: Patient rotated to the right. Similar cardiomegaly to prior exam. Increased retrocardiac opacity and left pleural effusion. Right paratracheal density likely confluent vessels as seen on prior CT. No pulmonary edema. No pneumothorax. IMPRESSION: Left pleural effusion with increased retrocardiac opacity from prior exam, may be compressive atelectasis or pneumonia in the setting of elevated white blood cell count. Cardiomegaly is similar. Electronically Signed   By: Jeb Levering M.D.   On: 10/15/2017 22:51      Assessment/Plan Chronic  diastolic CHF Hx of CAD PAF/A Flutter HTN CKD stage IV with AKI UTI  and sepsis Insulin dependent Diabetes mellitus GERD Hx of CVA and patient non-verbal Hx of colon cancer - s/p laparoscopic right colectomy 2015 Dr. Donne Hazel  Stage IV Sacral decubitus ulcer - wound base is necrotic, but I do not think wound is infected - patient is a poor surgical candidate due to multiple comorbidities  - agree with daily hydrotherapy and santyl and WTD dressing  - would recommend palliative care consult and establishing goals of care with patient family   FEN: HH/CM VTE: SQ heparin  ID: ceftriaxone 2/22>>  Patient with MMP, management per primary service. Not a candidate for surgical debridement at this time, agree with hydrotherapy and chemical debridement of necrotic tissue. Recommend palliative care consult when able to get in touch with patient's family members.   Brigid Re, [facility] Surgery 10/17/2017, 8:41 AM Pager: [phone] Consults: [phone] Mon-Fri 7:00 am-4:30 pm Sat-Sun 7:00 am-11:30 am

## 2017-10-17 NOTE — Progress Notes (Signed)
Initial Nutrition Assessment  DOCUMENTATION CODES:   Not applicable  INTERVENTION:   Magic cup TID with meals, each supplement provides 290 kcal and 9 grams of protein  NUTRITION DIAGNOSIS:   Increased nutrient needs related to wound healing as evidenced by estimated needs.  GOAL:   Patient will meet greater than or equal to 90% of their needs  MONITOR:   PO intake, Supplement acceptance, Skin, Weight trends, Labs, I & O's  REASON FOR ASSESSMENT:   Consult Assessment of nutrition requirement/status  ASSESSMENT:   Pt with PMH significant for CHF, CKD IV, colon cancer, stroke (bed bound, minimal verbal), CAD, DM, diabetic neuropathy, and HTN. Presents this admission with worsening renal labs. Admitted for UTI and sepsis.    Pt nonverbal. Unable to nod yes or no. Unable to obtain nutrition history PTA. Spoke with nurse at bedside. RN states pt will not drink anything but likes to eat sweets. Pt ate approximately 10% of his strawberry shortcake this morning. Nurse does not think he will drink or eat any supplementation. Will try to send magic cups in attempts to maximize protein. [place] meeting to take place, palliative having a hard time getting in touch with family. If family wishes aggressive measures, recommend placement of feeding tube if PO intake remains poor.   Records indicate pt's weight fluctuates from 195-215 lb. Suspect some of this is from fluid loss /fluid accumulation. Nutrition-Focused physical exam completed (edema may be masking losses). Cannot diagnosis malnutrition at this time given lack of evidence. If poor PO intake continues pt is a risk.   Medications reviewed and include: ferrous sulfate, Lantus, Remeron, Rena-vit, IV abx Labs reviewed: K 3.4 (L) Creatinine 4.11 (H)   NUTRITION - FOCUSED PHYSICAL EXAM:    Most Recent Value  Orbital Region  Mild depletion  Upper Arm Region  Unable to assess  Thoracic and Lumbar Region  Unable to assess  Buccal Region  No  depletion  Temple Region  Moderate depletion  Clavicle Bone Region  Moderate depletion  Clavicle and Acromion Bone Region  Moderate depletion  Scapular Bone Region  Unable to assess  Dorsal Hand  No depletion  Patellar Region  No depletion  Anterior Thigh Region  No depletion  Posterior Calf Region  No depletion  Edema (RD Assessment)  Mild  Hair  Reviewed  Eyes  Reviewed  Mouth  Unable to assess  Skin  Reviewed  Nails  Reviewed     Diet Order:  Diet heart healthy/carb modified Room service appropriate? Yes; Fluid consistency: Thin  EDUCATION NEEDS:   Not appropriate for education at this time  Skin:  Skin Assessment: Skin Integrity Issues: Skin Integrity Issues:: Stage IV, Unstageable Stage IV: sacrum Unstageable: left heel, right foot  Last BM:  PTA  Height:   Ht Readings from Last 1 Encounters:  10/15/17 6' (1.829 m)    Weight:   Wt Readings from Last 1 Encounters:  10/15/17 200 lb (90.7 kg)    Ideal Body Weight:  80.9 kg  BMI:  Body mass index is 27.12 kg/m.  Estimated Nutritional Needs:   Kcal:  2000-2200 kcal/day  Protein:  100-110 g/day  Fluid:  >2 L/day    [name] RD, LDN Clinical Nutrition Pager # [phone]

## 2017-10-18 LAB — BASIC METABOLIC PANEL
ANION GAP: 21 — AB (ref 5–15)
BUN: 77 mg/dL — ABNORMAL HIGH (ref 6–20)
CHLORIDE: 111 mmol/L (ref 101–111)
CO2: 11 mmol/L — AB (ref 22–32)
Calcium: 6.8 mg/dL — ABNORMAL LOW (ref 8.9–10.3)
Creatinine, Ser: 3.71 mg/dL — ABNORMAL HIGH (ref 0.61–1.24)
GFR calc non Af Amer: 14 mL/min — ABNORMAL LOW (ref >60)
GFR, EST AFRICAN AMERICAN: 16 mL/min — AB (ref >60)
Glucose, Bld: 84 mg/dL (ref 65–99)
POTASSIUM: 2.5 mmol/L — AB (ref 3.5–5.1)
Sodium: 143 mmol/L (ref 135–145)

## 2017-10-18 LAB — GLUCOSE, CAPILLARY
GLUCOSE-CAPILLARY: 82 mg/dL (ref 65–99)
Glucose-Capillary: 147 mg/dL — ABNORMAL HIGH (ref 65–99)
Glucose-Capillary: 75 mg/dL (ref 65–99)
Glucose-Capillary: 81 mg/dL (ref 65–99)
Glucose-Capillary: 86 mg/dL (ref 65–99)
Glucose-Capillary: 77 mg/dL (ref 65–99)

## 2017-10-18 MED ORDER — POTASSIUM CHLORIDE 10 MEQ/100ML IV SOLN
10.0000 meq | INTRAVENOUS | Status: AC
  Administered 2017-10-18 (×4): 10 meq via INTRAVENOUS
  Filled 2017-10-18 (×4): qty 100

## 2017-10-18 MED ORDER — POTASSIUM CHLORIDE 20 MEQ PO PACK
40.0000 meq | PACK | ORAL | Status: AC
  Administered 2017-10-18 (×2): 40 meq via ORAL
  Filled 2017-10-18 (×2): qty 2

## 2017-10-18 MED ORDER — DEXTROSE 50 % IV SOLN
INTRAVENOUS | Status: AC
  Administered 2017-10-18: 50 mL
  Filled 2017-10-18: qty 50

## 2017-10-18 NOTE — Progress Notes (Signed)
Palliative Medicine consult noted. Due to high referral volume, there may be a delay seeing this patient. Please call the Palliative Medicine Team office at [phone] if recommendations are needed in the interim.  Thank you for inviting [place] to see this patient.  Marjie Skiff Oluwadara Gorman, RN, BSN, [facility] Palliative Medicine Team 10/18/2017 8:01 AM Office [phone]

## 2017-10-18 NOTE — Progress Notes (Signed)
Physical Therapy Wound Evaluation/Treatment Patient Details  Name: Numair Masden MRN: [id number] Date of Birth: 07/23/1934  Today's Date: 10/18/2017 Time: 0920-1000 Time Calculation (min): 40 min  Subjective  Subjective: Pt somewhat pleasant but agreeable to hydrotherapy after pain medication.  Patient and Family Stated Goals: None stated  Pain Score:  Grimacing and calling out during treatment.   Wound Assessment  Pressure Injury 10/16/17 Stage IV - Full thickness tissue loss with exposed bone, tendon or muscle. WOC assessment in ED 10/16/17 (Active)  Dressing Type ABD;Barrier Film (skin prep);Gauze (Comment);Moist to dry 10/18/2017 10:09 AM  Dressing Changed;Clean;Dry;Intact 10/18/2017 10:09 AM  Dressing Change Frequency Daily 10/18/2017 10:09 AM  State of Healing Eschar 10/18/2017 10:09 AM  Site / Wound Assessment Yellow;Black 10/18/2017 10:09 AM  % Wound base Red or Granulating 0% 10/18/2017 10:09 AM  % Wound base Yellow/Fibrinous Exudate 25% 10/18/2017 10:09 AM  % Wound base Black/Eschar 75% 10/18/2017 10:09 AM  % Wound base Other/Granulation Tissue (Comment) 0% 10/18/2017 10:09 AM  Peri-wound Assessment Intact 10/18/2017 10:09 AM  Wound Length (cm) 6 cm 10/18/2017 10:09 AM  Wound Width (cm) 4.1 cm 10/18/2017 10:09 AM  Wound Depth (cm) 1.8 cm 10/18/2017 10:09 AM  Wound Surface Area (cm^2) 24.6 cm^2 10/18/2017 10:09 AM  Wound Volume (cm^3) 44.28 cm^3 10/18/2017 10:09 AM  Undermining (cm) 2.5 cm from 12-2:00; 1.5 cm from 2-12:00 10/18/2017 10:09 AM  Margins Unattached edges (unapproximated) 10/18/2017 10:09 AM  Drainage Amount Moderate 10/18/2017 10:09 AM  Drainage Description Purulent;Odor 10/18/2017 10:09 AM  Treatment Debridement (Selective);Packing (Saline gauze);Hydrotherapy (Pulse lavage) 10/18/2017 10:09 AM   Santyl applied to wound bed prior to applying dressing.     Hydrotherapy Pulsed lavage therapy - wound location: sacrum Pulsed Lavage with Suction (psi): 8 psi Pulsed Lavage with  Suction - Normal Saline Used: 1000 mL Pulsed Lavage Tip: Tip with splash shield Selective Debridement Selective Debridement - Location: sacrum Selective Debridement - Tools Used: Forceps;Scalpel Selective Debridement - Tissue Removed: black and yellow necrotic/unviable tissue   Wound Assessment and Plan  Wound Therapy - Assess/Plan/Recommendations Wound Therapy - Clinical Statement: Pt presents to hydrotherapy with an unstagable pressure injury. Pt will benefit from pulsed-lavage and debridement for selective removal of unviable tissue and to promote wound bed healing.  Wound Therapy - Functional Problem List: Decreased tolerance for sitting OOB or positioning on back due to wound Factors Delaying/Impairing Wound Healing: Diabetes Mellitus;Immobility;Multiple medical problems;Polypharmacy Hydrotherapy Plan: Debridement;Dressing change;Patient/family education;Pulsatile lavage with suction Wound Therapy - Frequency: 6X / week Wound Therapy - Follow Up Recommendations: Skilled nursing facility Wound Plan: See above  Wound Therapy Goals- Improve the function of patient's integumentary system by progressing the wound(s) through the phases of wound healing (inflammation - proliferation - remodeling) by: Decrease Necrotic Tissue to: 20% Decrease Necrotic Tissue - Progress: Goal set today Increase Granulation Tissue to: 80% Increase Granulation Tissue - Progress: Goal set today Goals/treatment plan/discharge plan were made with and agreed upon by patient/family: Yes Time For Goal Achievement: 7 days Wound Therapy - Potential for Goals: Good  Goals will be updated until maximal potential achieved or discharge criteria met.  Discharge criteria: when goals achieved, discharge from hospital, MD decision/surgical intervention, no progress towards goals, refusal/missing three consecutive treatments without notification or medical reason.  GP     Thelma Comp 10/18/2017, 10:31 AM   Rolinda Roan, PT, DPT Acute Rehabilitation Services Pager: [phone]

## 2017-10-18 NOTE — Progress Notes (Signed)
SLP Cancellation Note  Patient Details Name: [name] MRN: [id number] DOB: 06/29/1934   Cancelled treatment:       Reason Eval/Treat Not Completed: Patient declined, requesting he be allowed to sleep.  ST will follow up for possible clinical swallowing evaluation.    Shelly Flatten, MA, [place] SLP [phone]  Lamar Sprinkles 10/18/2017, 10:45 AM

## 2017-10-18 NOTE — Progress Notes (Signed)
PROGRESS NOTE  [name] ZOX:[id number] DOB: 1933/12/12 DOA: 10/15/2017 PCP: [name], PA   LOS: 2 days   Brief Narrative / Interim history: 84 year old male with history of a flutter/fib, not on anticoagulation because of GI bleed, anemia, chronic diastolic CHF, chronic kidney disease stage IV, coronary artery disease, prior CVA essentially bedbound and minimally verbal who presents with abnormal renal function and leukocytosis from his SNF.  Assessment & Plan: Principal Problem:   UTI (urinary tract infection) Active Problems:   Chronic diastolic CHF (congestive heart failure) (HCC)   PAF (paroxysmal atrial fibrillation) (HCC)   Stroke (cerebrum) (HCC)   HTN (hypertension)   Acute renal failure superimposed on stage 4 chronic kidney disease (HCC)   Iron deficiency anemia due to chronic blood loss   Hyperkalemia   Type II diabetes mellitus with renal manifestations (HCC)   GERD (gastroesophageal reflux disease)   CAD (coronary artery disease)   Sepsis ([place])   Sacral decubitus ulcer, stage IV (HCC)   Sepsis likely due to urinary tract infection -Patient started on ceftriaxone, continue -Urine cultures unfortunately auto-cancelled by lab, and blood cultures are pending, so far no growth @ day 1 -MRSA PCR negative, no need for Vancomycin  Chronic diastolic CHF -Seems to be compensated, 2D echo in December 2018 showed an EF of 60% -sepsis physiology resolved, discontinue fluids today   PAF and a flutter -Not a good candidate for anti-cognition due to history of GI bleed, continue rate control  Hypertension -BP 123/55, continue to hole clonidine, Norvasc and oral hydralazine  Acute kidney injury on chronic kidney disease stage IV -repeat Cr this morning pending  Prior CVAs -Patient minimally verbal, essentially bedbound  Stage IV sacral decubitus ulcer -Wound care consulted, wound appears necrotic with potentially needing debridement, I am not sure if he is  a good surgical candidate.  -For now hydrotherapy and antibiotics, continue Ceftriaxone / Metronidazole -general surgery consulted -palliative consulted as well   Anemia in the setting of chronic kidney disease -Hemoglobin stable, monitor, no bleeding   DVT prophylaxis: heparin Code Status: Full code Family Communication: no family at bedside    Disposition Plan: SNF when ready   Consultants:   Palliative  General surgery   Procedures:   Hydrotherapy   Antimicrobials:  Ceftriaxone 2/21 >>   Metronidazole 2/22 >>  Subjective: -not responding much to me. Appears comfortable  Objective: Vitals:   10/17/17 1710 10/17/17 2055 10/18/17 0423 10/18/17 1019  BP: (!) 122/51 (!) 117/51 (!) 120/49 (!) 123/55  Pulse: 66 80 63 66  Resp: 18 18 16 16   Temp: 98 F (36.7 C) 98 F (36.7 C) 97.9 F (36.6 C) 98.4 F (36.9 C)  TempSrc: Oral Oral Oral Oral  SpO2: 100% 100% 100% 100%  Weight:      Height:        Intake/Output Summary (Last 24 hours) at 10/18/2017 1307 Last data filed at 10/18/2017 1213 Gross per 24 hour  Intake 750 ml  Output 925 ml  Net -175 ml   Filed Weights   10/15/17 2220  Weight: 90.7 kg (200 lb)    Examination:  Constitutional: NAD Eyes: no scleral icterus ENMT: mmm Respiratory: CTA biL, no crackles, no wheezing  Cardiovascular: RRR without murmurs. No edema  Abdomen: soft, non tender, bowel sounds positive  Skin: no rashes Neurologic: non focal    Data Reviewed: I have independently reviewed following labs and imaging studies   CBC: Recent Labs  Lab 10/16/17 0026 10/16/17 0430 10/17/17 0416  WBC 22.3* 21.0* 13.4*  NEUTROABS 19.8*  --   --   HGB 9.5* 9.5* 8.8*  HCT 28.6* 30.5* 27.9*  MCV 87.5 88.2 88.3  PLT 241 178 263   Basic Metabolic Panel: Recent Labs  Lab 10/16/17 0026 10/16/17 0430 10/17/17 0416  NA 135 136 140  K 5.8* 4.6 3.4*  CL 109 107 112*  CO2 9* 11* 11*  GLUCOSE 112* 96 86  BUN 94* 93* 82*  CREATININE  4.61* 4.57* 4.11*  CALCIUM 8.1* 8.0* 7.9*   GFR: Estimated Creatinine Clearance: 15.5 mL/min (A) (by C-G formula based on SCr of 4.11 mg/dL (H)). Liver Function Tests: Recent Labs  Lab 10/16/17 0026  AST 12*  ALT <5*  ALKPHOS 110  BILITOT 1.5*  PROT 5.8*  ALBUMIN 2.5*   No results for input(s): LIPASE, AMYLASE in the last 168 hours. No results for input(s): AMMONIA in the last 168 hours. Coagulation Profile: No results for input(s): INR, PROTIME in the last 168 hours. Cardiac Enzymes: No results for input(s): CKTOTAL, CKMB, CKMBINDEX, TROPONINI in the last 168 hours. BNP (last 3 results) No results for input(s): PROBNP in the last 8760 hours. HbA1C: No results for input(s): HGBA1C in the last 72 hours. CBG: Recent Labs  Lab 10/17/17 2139 10/18/17 0145 10/18/17 0419 10/18/17 0712 10/18/17 1207  GLUCAP 146* 147* 75 81 86   Lipid Profile: No results for input(s): CHOL, HDL, LDLCALC, TRIG, CHOLHDL, LDLDIRECT in the last 72 hours. Thyroid Function Tests: No results for input(s): TSH, T4TOTAL, FREET4, T3FREE, THYROIDAB in the last 72 hours. Anemia Panel: No results for input(s): VITAMINB12, FOLATE, FERRITIN, TIBC, IRON, RETICCTPCT in the last 72 hours. Urine analysis:    Component Value Date/Time   COLORURINE YELLOW 10/15/2017 2237   APPEARANCEUR CLOUDY (A) 10/15/2017 2237   LABSPEC 1.017 10/15/2017 2237   PHURINE 5.0 10/15/2017 2237   GLUCOSEU NEGATIVE 10/15/2017 2237   HGBUR LARGE (A) 10/15/2017 2237   BILIRUBINUR NEGATIVE 10/15/2017 2237   KETONESUR 5 (A) 10/15/2017 2237   PROTEINUR 30 (A) 10/15/2017 2237   UROBILINOGEN 1.0 05/18/2015 1407   NITRITE NEGATIVE 10/15/2017 2237   LEUKOCYTESUR LARGE (A) 10/15/2017 2237   Sepsis Labs: Invalid input(s): PROCALCITONIN, LACTICIDVEN  Recent Results (from the past 240 hour(s))  Culture, blood (x 2)     Status: None (Preliminary result)   Collection Time: 10/16/17  4:30 AM  Result Value Ref Range Status   Specimen  Description BLOOD LEFT ARM  Final   Special Requests   Final    Blood Culture adequate volume BOTTLES DRAWN AEROBIC AND ANAEROBIC   Culture   Final    NO GROWTH 1 DAY Performed at [facility], [address], [place], [place] [address]    Report Status PENDING  Incomplete  Culture, blood (x 2)     Status: None (Preliminary result)   Collection Time: 10/16/17  4:30 AM  Result Value Ref Range Status   Specimen Description BLOOD LEFT ARM  Final   Special Requests Blood Culture adequate volume IN PEDIATRIC BOTTLE  Final   Culture   Final    NO GROWTH 1 DAY Performed at [facility], [place] [address] [place], [place] [address]    Report Status PENDING  Incomplete  MRSA PCR Screening     Status: None   Collection Time: 10/17/17  7:36 AM  Result Value Ref Range Status   MRSA by PCR NEGATIVE NEGATIVE Final    Comment:  The GeneXpert MRSA Assay (FDA approved for NASAL specimens only), is one component of a comprehensive MRSA colonization surveillance program. It is not intended to diagnose MRSA infection nor to guide or monitor treatment for MRSA infections. Performed at [facility], [place] [address], [place], [place] [address]       Radiology Studies: No results found.   Scheduled Meds: . aspirin  325 mg Oral Daily  . atorvastatin  20 mg Oral q1800  . collagenase   Topical Daily  . ferrous sulfate  325 mg Oral TID WC  . fluticasone  2 spray Each Nare Daily  . heparin  5,000 Units Subcutaneous Q8H  . insulin glargine  10 Units Subcutaneous Q2200  . mirtazapine  7.5 mg Oral QHS  . multivitamin  1 tablet Oral QHS  . pantoprazole  40 mg Oral Q0600  . risperiDONE  1 mg Oral QHS  . senna  2 tablet Oral Q8H   Continuous Infusions: . sodium chloride 1,000 mL (10/17/17 2119)  . cefTRIAXone (ROCEPHIN)  IV 1 g (10/18/17 0122)  . metronidazole Stopped (10/18/17 1027)     Marzetta Board, MD, PhD [facility] Pager [phone]  If 7PM-7AM,  please contact night-coverage [email or web address] Password [facility] 10/18/2017, 1:07 PM

## 2017-10-19 LAB — CBC
HEMATOCRIT: 27.1 % — AB (ref 39.0–52.0)
HEMOGLOBIN: 8.8 g/dL — AB (ref 13.0–17.0)
MCH: 28.5 pg (ref 26.0–34.0)
MCHC: 32.5 g/dL (ref 30.0–36.0)
MCV: 87.7 fL (ref 78.0–100.0)
Platelets: 133 10*3/uL — ABNORMAL LOW (ref 150–400)
RBC: 3.09 MIL/uL — ABNORMAL LOW (ref 4.22–5.81)
RDW: 17.8 % — ABNORMAL HIGH (ref 11.5–15.5)
WBC: 11.7 10*3/uL — ABNORMAL HIGH (ref 4.0–10.5)

## 2017-10-19 LAB — BASIC METABOLIC PANEL
ANION GAP: 10 (ref 5–15)
BUN: 75 mg/dL — ABNORMAL HIGH (ref 6–20)
CHLORIDE: 119 mmol/L — AB (ref 101–111)
CO2: 12 mmol/L — ABNORMAL LOW (ref 22–32)
Calcium: 7.3 mg/dL — ABNORMAL LOW (ref 8.9–10.3)
Creatinine, Ser: 3.59 mg/dL — ABNORMAL HIGH (ref 0.61–1.24)
GFR calc Af Amer: 17 mL/min — ABNORMAL LOW (ref >60)
GFR, EST NON AFRICAN AMERICAN: 15 mL/min — AB (ref >60)
GLUCOSE: 68 mg/dL (ref 65–99)
POTASSIUM: 3.9 mmol/L (ref 3.5–5.1)
Sodium: 141 mmol/L (ref 135–145)

## 2017-10-19 LAB — GLUCOSE, CAPILLARY
GLUCOSE-CAPILLARY: 76 mg/dL (ref 65–99)
GLUCOSE-CAPILLARY: 94 mg/dL (ref 65–99)
Glucose-Capillary: 77 mg/dL (ref 65–99)
Glucose-Capillary: 84 mg/dL (ref 65–99)

## 2017-10-19 LAB — MAGNESIUM: Magnesium: 1.2 mg/dL — ABNORMAL LOW (ref 1.7–2.4)

## 2017-10-19 MED ORDER — METRONIDAZOLE 500 MG PO TABS
500.0000 mg | ORAL_TABLET | Freq: Three times a day (TID) | ORAL | Status: DC
  Administered 2017-10-19 – 2017-10-20 (×4): 500 mg via ORAL
  Filled 2017-10-19 (×5): qty 1

## 2017-10-19 MED ORDER — HYDROCODONE-ACETAMINOPHEN 5-325 MG PO TABS
1.0000 | ORAL_TABLET | Freq: Four times a day (QID) | ORAL | Status: DC | PRN
  Administered 2017-10-19 – 2017-10-20 (×3): 2 via ORAL
  Filled 2017-10-19 (×3): qty 2

## 2017-10-19 NOTE — Evaluation (Signed)
Clinical/Bedside Swallow Evaluation Patient Details  Name: [name] MRN: [id number] Date of Birth: 1934/02/06  Today's Date: 10/19/2017 Time: SLP Start Time (ACUTE ONLY): 70 SLP Stop Time (ACUTE ONLY): 0930 SLP Time Calculation (min) (ACUTE ONLY): 10 min  Past Medical History:  Past Medical History:  Diagnosis Date  . Atrial fibrillation ([place])   . Atrial flutter ([place])    hx/notes 11/28/2016  . CHF (congestive heart failure) ([place])   . Chronic diastolic CHF (congestive heart failure) ([place])    hx/notes 11/28/2016  . CKD (chronic kidney disease), stage IV ([place])    hx/notes 11/28/2016  . Colon cancer ([place])    hx/notes 11/28/2016  . Coronary artery disease   . Depression    hx/notes 11/28/2016  . Diabetic neuropathy ([place])    hx/notes 11/28/2016  . GI bleeding 11/27/2016   notes 11/28/2016  . History of blood transfusion 11/28/2016   hx/notes 11/28/2016  . Hypertension   . IDDM (insulin dependent diabetes mellitus) ([place])    uncontrolled; hx/notes 11/28/2016   Past Surgical History:  Past Surgical History:  Procedure Laterality Date  . COLONOSCOPY  06/21/2012   Procedure: COLONOSCOPY;  Surgeon: Juanita Craver, MD;  Location: [facility] ENDOSCOPY;  Service: Endoscopy;  Laterality: N/A;  . COLONOSCOPY Left 02/13/2014   Procedure: COLONOSCOPY;  Surgeon: Juanita Craver, MD;  Location: [facility] ENDOSCOPY;  Service: Endoscopy;  Laterality: Left;  . ESOPHAGOGASTRODUODENOSCOPY  06/19/2012   Procedure: ESOPHAGOGASTRODUODENOSCOPY (EGD);  Surgeon: Beryle Beams, MD;  Location: [facility] ENDOSCOPY;  Service: Endoscopy;  Laterality: N/A;  . LAPAROSCOPIC RIGHT COLECTOMY Right 02/15/2014   Procedure: LAPAROSCOPIC ASISSTED RIGHT  COLECTOMY, POSSIBLE OPEN;  Surgeon: Rolm Bookbinder, MD;  Location: [place];  Service: General;  Laterality: Right;  . LEG SURGERY     Left leg surgery. broken leg   HPI:  84 year old male with history of a flutter/fib, not on anticoagulation because of GI bleed, anemia, chronic diastolic CHF, chronic  kidney disease stage IV, coronary artery disease, prior CVA essentially bedbound and minimally verbal who presents with abnormal renal function and leukocytosis from his SNF. Admitted with sepsis due to UTI.   Assessment / Plan / Recommendation Clinical Impression  Patient presents with appearance of timely swallow initiation and adequate airway protection for thin liquids. He appears capable of following commands but refuses. Responds appropriately to basic biographical and Y/N questions. Minimal verbal communication, which is limited to single words or short phrases. Pt consumed approximately 6 oz of water via straw consecutively with no overt signs of aspiration. With purees, airway protection appears adequate, though pt does hold orally prior to swallowing. I suspect this is due to distaste for items presented. RN reports pt masticates and expectorates solids, which are noted on pt's breakfast tray. Will downgrade to dys 1, thin liquids. Recommend full supervision and meds crushed in puree, position upright and assist pt with self-feeding as much as he is able to participate. Will follow.    SLP Visit Diagnosis: Dysphagia, oral phase (R13.11)    Aspiration Risk  Moderate aspiration risk    Diet Recommendation Dysphagia 1 (Puree);Thin liquid   Liquid Administration via: Straw Medication Administration: Crushed with puree Supervision: Full supervision/cueing for compensatory strategies;Staff to assist with self feeding Postural Changes: Seated upright at 90 degrees    Other  Recommendations Oral Care Recommendations: Oral care BID   Follow up Recommendations Skilled Nursing facility      Frequency and Duration min 1 x/week  1 week       Prognosis Prognosis  for Safe Diet Advancement: Fair Barriers to Reach Goals: Cognitive deficits;Language deficits;Time post onset      Swallow Study   General Date of Onset: 10/15/17 HPI: 84 year old male with history of a flutter/fib, not on  anticoagulation because of GI bleed, anemia, chronic diastolic CHF, chronic kidney disease stage IV, coronary artery disease, prior CVA essentially bedbound and minimally verbal who presents with abnormal renal function and leukocytosis from his SNF. Admitted with sepsis due to UTI. Type of Study: Bedside Swallow Evaluation Previous Swallow Assessment: Bedside swallow evaluation 08/05/17 with recommendation for full liquids, meds with puree, f/u at SNF for diet progression; EGD 2013 revealed esophagitis, mild gastritis Diet Prior to this Study: Regular;Thin liquids Temperature Spikes Noted: No Respiratory Status: Room air History of Recent Intubation: No Behavior/Cognition: Requires cueing;Alert;Confused Oral Cavity Assessment: Within Functional Limits Oral Care Completed by SLP: No Oral Cavity - Dentition: Edentulous Self-Feeding Abilities: Total assist Patient Positioning: Upright in bed Baseline Vocal Quality: Normal Volitional Cough: Cognitively unable to elicit Volitional Swallow: Unable to elicit    Oral/Motor/Sensory Function Overall Oral Motor/Sensory Function: Generalized oral weakness(does not follow commands)   Ice Chips Ice chips: Not tested   Thin Liquid Thin Liquid: Within functional limits Presentation: Straw    Nectar Thick Nectar Thick Liquid: Not tested   Honey Thick Honey Thick Liquid: Not tested   Puree Puree: Impaired Presentation: Spoon Oral Phase Impairments: Poor awareness of bolus Oral Phase Functional Implications: Oral holding   Solid   GO   Solid: Not tested(masticated and expectorated solids present on breakfast tray)       Deneise Lever, MS, CCC-SLP Speech-Language Pathologist [phone]  Aliene Altes 10/19/2017,9:42 AM

## 2017-10-19 NOTE — Progress Notes (Signed)
Changed dressing on his Stage IV sacral pressure ulcer at this time.

## 2017-10-19 NOTE — Consult Note (Signed)
Consultation Note Date: 10/19/2017   Patient Name: [name]  DOB: 11/25/1933  MRN: [id number]  Age / Sex: 84 y.o., male  PCP: [name], [place] Referring Physician: Caren Griffins, MD  Reason for Consultation: Establishing goals of care, Pain control and Psychosocial/spiritual support  HPI/Patient Profile: 84 y.o. male  with past medical history of GIB, colon cancer, DM, CHF, Afib/flutter, CKD IV, CVA (bed bound), and stg 4 sacral decubitus who was admitted on 10/15/2017 with sepsis secondary to UTI.   Clinical Assessment and Goals of Care:  I have reviewed medical records including EPIC notes, labs and imaging, received report from the care team, assessed the patient and then met at the bedside along with his daughter Sondra Barges, his good friend Belva Chimes, and another friend of the family  to discuss diagnosis prognosis, [place], EOL wishes, disposition and options.  I introduced Palliative Medicine as specialized medical care for people living with serious illness. It focuses on providing relief from the symptoms and stress of a serious illness. The goal is to improve quality of life for both the patient and the family.  As far as functional and nutritional status he is bed bound.  He is eating minimal amounts.  The family feels he has declined significantly in his most recent SNF and with the development of his large sacral wound.   We discussed his current illness and what it means in the larger context of his on-going co-morbidities.  Natural disease trajectory and expectations at EOL were discussed.  Specifically we discussed - heart disease in combination with advanced kidney disease.  We also discussed severe malnutrition with stage 4 wound.  A strong recommendation was made for changing his code status to DNR.  Friend at bedside understood and agreed but Sondra Barges and Coralyn Mark felt differently and at this point  wanted him to remain a full code.  Belva Chimes - the patient's close friend and primary care taker explained that he is in charge of the patient's financial issues but he want to defer health care decisions to the patient's daughter Sondra Barges.      I felt strongly that Tami may have a difficult time understanding the complexity of her father's health care and the impact of major decisions - such as code status and GOC.  Despite this we reviewed a MOST form.  After  a 45+ minute discussion Tami and I agreed to meet again 2/25 at 10:30.   Primary Decision Maker:  NEXT OF KIN daughter Sondra Barges and close friend Belva Chimes    SUMMARY OF RECOMMENDATIONS    Follow up meeting 2/25 Continue full scope treatment.  Code Status/Advance Care Planning:  FULL    Prognosis:  Poor given very poor PO intake.  If he continues to eat minimal amounts he will likely only survive weeks.  If his PO intake improves he still has a prognosis of less than 6 months given stage 4 CKD, advanced heart disease, bed bound status, and stage 4 sacral decub.  Discharge Planning: Skilled Nursing  Facility for rehab with Palliative care service follow-up      Primary Diagnoses: Present on Admission: . Type II diabetes mellitus with renal manifestations ([place]) . Stroke (cerebrum) ([place]) . PAF (paroxysmal atrial fibrillation) ([place]) . Iron deficiency anemia due to chronic blood loss . Hyperkalemia . HTN (hypertension) . GERD (gastroesophageal reflux disease) . Acute renal failure superimposed on stage 4 chronic kidney disease ([place]) . Chronic diastolic CHF (congestive heart failure) ([place]) . CAD (coronary artery disease) . UTI (urinary tract infection) . Sepsis ([place]) . Sacral decubitus ulcer, stage IV ([place])   I have reviewed the medical record, interviewed the patient and family, and examined the patient. The following aspects are pertinent.  Past Medical History:  Diagnosis Date  . Atrial fibrillation ([place])   . Atrial  flutter ([place])    hx/notes 11/28/2016  . CHF (congestive heart failure) ([place])   . Chronic diastolic CHF (congestive heart failure) ([place])    hx/notes 11/28/2016  . CKD (chronic kidney disease), stage IV ([place])    hx/notes 11/28/2016  . Colon cancer ([place])    hx/notes 11/28/2016  . Coronary artery disease   . Depression    hx/notes 11/28/2016  . Diabetic neuropathy ([place])    hx/notes 11/28/2016  . GI bleeding 11/27/2016   notes 11/28/2016  . History of blood transfusion 11/28/2016   hx/notes 11/28/2016  . Hypertension   . IDDM (insulin dependent diabetes mellitus) ([place])    uncontrolled; hx/notes 11/28/2016   Social History   Socioeconomic History  . Marital status: Widowed    Spouse name: None  . Number of children: None  . Years of education: None  . Highest education level: None  Social Needs  . Financial resource strain: None  . Food insecurity - worry: None  . Food insecurity - inability: None  . Transportation needs - medical: None  . Transportation needs - non-medical: None  Occupational History  . Occupation: Retired  Tobacco Use  . Smoking status: Former Smoker    Last attempt to quit: 08/26/1998    Years since quitting: 19.1  . Smokeless tobacco: Never Used  Substance and Sexual Activity  . Alcohol use: No    Comment: in the past  . Drug use: No  . Sexual activity: No  Other Topics Concern  . None  Social History Narrative   Widower-   Has #2 children and #2 grandchildren and a great grandchild   Retired from [employer] (15 years ago)   Currently resides at [facility] and [place] with walker-   Family History  Problem Relation Age of Onset  . Brain cancer Mother   . Diabetes type II Sister   . Narcolepsy Paternal Uncle    Scheduled Meds: . aspirin  325 mg Oral Daily  . atorvastatin  20 mg Oral q1800  . collagenase   Topical Daily  . ferrous sulfate  325 mg Oral TID WC  . fluticasone  2 spray Each Nare Daily  . heparin  5,000 Units Subcutaneous Q8H    . insulin glargine  10 Units Subcutaneous Q2200  . metroNIDAZOLE  500 mg Oral Q8H  . mirtazapine  7.5 mg Oral QHS  . multivitamin  1 tablet Oral QHS  . pantoprazole  40 mg Oral Q0600  . risperiDONE  1 mg Oral QHS  . senna  2 tablet Oral Q8H   Continuous Infusions: . cefTRIAXone (ROCEPHIN)  IV Stopped (10/19/17 0141)   PRN Meds:.acetaminophen **OR** acetaminophen, benzonatate, hydrALAZINE, HYDROcodone-acetaminophen, ondansetron **OR**  ondansetron (ZOFRAN) IV, zolpidem Allergies  Allergen Reactions  . Metformin And Related Other (See Comments)    Reaction: pt states he thinks he threw up   Review of Systems patient has difficulty speaking from previous CVA  Physical Exam well developed but chronically ill appearing male. NAD, bedbound, will respond but very difficult to understand  Vital Signs: BP (!) 123/50 (BP Location: Left Arm)   Pulse 62   Temp 98 F (36.7 C) (Oral)   Resp 18   Ht 6' (1.829 m)   Wt 90.7 kg (200 lb)   SpO2 100%   BMI 27.12 kg/m  Pain Assessment: No/denies pain   Pain Score: 0-No pain   SpO2: SpO2: 100 % O2 Device:SpO2: 100 % O2 Flow Rate: .   IO: Intake/output summary:   Intake/Output Summary (Last 24 hours) at 10/19/2017 1534 Last data filed at 10/19/2017 9924 Gross per 24 hour  Intake 420 ml  Output 75 ml  Net 345 ml    LBM: Last BM Date: 10/16/17 Baseline Weight: Weight: 90.7 kg (200 lb) Most recent weight: Weight: 90.7 kg (200 lb)     Palliative Assessment/Data:     Time In: 2:00 Time Out: 3:10 Time Total: 70 min. Greater than 50%  of this time was spent counseling and coordinating care related to the above assessment and plan.  Signed by: Florentina Jenny, PA-C Palliative Medicine Pager: [phone]  Please contact Palliative Medicine Team phone at [phone] for questions and concerns.  For individual provider: See Shea Evans

## 2017-10-19 NOTE — Progress Notes (Signed)
PHARMACIST - PHYSICIAN COMMUNICATION  CONCERNING: Antibiotic IV to Oral Route Change Policy  RECOMMENDATION: This patient is receiving Metronidazole by the intravenous route.  Based on criteria approved by the Pharmacy and Therapeutics Committee, the antibiotic(s) is/are being converted to the equivalent oral dose form(s).   DESCRIPTION:  These criteria include:  Patient being treated for a respiratory tract infection, urinary tract infection, cellulitis or clostridium difficile associated diarrhea if on metronidazole  The patient is not neutropenic and does not exhibit a GI malabsorption state  The patient is eating (either orally or via tube) and/or has been taking other orally administered medications for a least 24 hours  The patient is improving clinically and has a Tmax < 100.5  If you have questions about this conversion, please contact the Pharmacy Department  []   [phone] )  [facility] []   [phone] )  [facility] [x]   [phone] )  [facility] []   [phone] )  [facility] []   [phone] )  [place], PharmD Pharmacy Resident Pager: [phone]

## 2017-10-19 NOTE — Progress Notes (Signed)
PROGRESS NOTE  [name] ZJI:[id number] DOB: April 16, 1934 DOA: 10/15/2017 PCP: [name], PA   LOS: 3 days   Brief Narrative / Interim history: 84 year old male with history of a flutter/fib, not on anticoagulation because of GI bleed, anemia, chronic diastolic CHF, chronic kidney disease stage IV, coronary artery disease, prior CVA essentially bedbound and minimally verbal who presents with abnormal renal function and leukocytosis from his SNF.  Assessment & Plan: Principal Problem:   UTI (urinary tract infection) Active Problems:   Chronic diastolic CHF (congestive heart failure) (HCC)   PAF (paroxysmal atrial fibrillation) (HCC)   Stroke (cerebrum) (HCC)   HTN (hypertension)   Acute renal failure superimposed on stage 4 chronic kidney disease (H[facility])   Iron deficiency anemia due to chronic blood loss   Hyperkalemia   Type II diabetes mellitus with renal manifestations (HCC)   GERD (gastroesophageal reflux disease)   CAD (coronary artery disease)   Sepsis ([place])   Sacral decubitus ulcer, stage IV (HCC)   Sepsis likely due to urinary tract infection -Patient started on ceftriaxone, continue -Urine cultures unfortunately auto-cancelled by lab, and blood cultures are pending, so far no growth @ day 2 -MRSA PCR negative, no need for Vancomycin  Chronic diastolic CHF -Seems to be compensated, 2D echo in December 2018 showed an EF of 60% -sepsis physiology resolved  PAF and a flutter -Not a good candidate for anti-cognition due to history of GI bleed, continue rate control  Hypertension -BP 123/50, continue to hole clonidine, Norvasc and oral hydralazine  Acute kidney injury on chronic kidney disease stage IV -Creatinine is now improving  Prior CVAs -Patient minimally verbal, essentially bedbound  Stage IV sacral decubitus ulcer -Wound care consulted, wound appears necrotic with potentially needing debridement, I am not sure if he is a good surgical candidate.    -For now hydrotherapy and antibiotics, continue Ceftriaxone / Metronidazole -general surgery consulted -palliative consulted as well   Anemia in the setting of chronic kidney disease -Hemoglobin stable, monitor, no bleeding   DVT prophylaxis: heparin Code Status: Full code Family Communication: no family at bedside    Disposition Plan: SNF when ready   Consultants:   Palliative  General surgery   Procedures:   Hydrotherapy   Antimicrobials:  Ceftriaxone 2/21 >>   Metronidazole 2/22 >>  Subjective: -Complains of back pain, confused  Objective: Vitals:   10/18/17 1800 10/18/17 2200 10/19/17 0647 10/19/17 0903  BP: (!) 126/50 134/62 (!) 127/58 (!) 123/50  Pulse: 63 88 64 62  Resp: 18 17 18 18   Temp: 98 F (36.7 C) 98.1 F (36.7 C) 98.5 F (36.9 C) 98 F (36.7 C)  TempSrc: Oral Oral Oral Oral  SpO2: 100% 100% 97% 100%  Weight:      Height:        Intake/Output Summary (Last 24 hours) at 10/19/2017 1433 Last data filed at 10/19/2017 0649 Gross per 24 hour  Intake 420 ml  Output 75 ml  Net 345 ml   Filed Weights   10/15/17 2220  Weight: 90.7 kg (200 lb)    Examination:  Constitutional: No distress Respiratory: Clear to auscultation Cardiovascular: Regular rate and rhythm   Data Reviewed: I have independently reviewed following labs and imaging studies   CBC: Recent Labs  Lab 10/16/17 0026 10/16/17 0430 10/17/17 0416 10/19/17 0922  WBC 22.3* 21.0* 13.4* 11.7*  NEUTROABS 19.8*  --   --   --   HGB 9.5* 9.5* 8.8* 8.8*  HCT 28.6* 30.5* 27.9* 27.1*  MCV 87.5 88.2 88.3 87.7  PLT 241 178 167 932*   Basic Metabolic Panel: Recent Labs  Lab 10/16/17 0026 10/16/17 0430 10/17/17 0416 10/18/17 1430 10/19/17 0922  NA 135 136 140 143 141  K 5.8* 4.6 3.4* 2.5* 3.9  CL 109 107 112* 111 119*  CO2 9* 11* 11* 11* 12*  GLUCOSE 112* 96 86 84 68  BUN 94* 93* 82* 77* 75*  CREATININE 4.61* 4.57* 4.11* 3.71* 3.59*  CALCIUM 8.1* 8.0* 7.9* 6.8* 7.3*   MG  --   --   --   --  1.2*   GFR: Estimated Creatinine Clearance: 17.7 mL/min (A) (by C-G formula based on SCr of 3.59 mg/dL (H)). Liver Function Tests: Recent Labs  Lab 10/16/17 0026  AST 12*  ALT <5*  ALKPHOS 110  BILITOT 1.5*  PROT 5.8*  ALBUMIN 2.5*   No results for input(s): LIPASE, AMYLASE in the last 168 hours. No results for input(s): AMMONIA in the last 168 hours. Coagulation Profile: No results for input(s): INR, PROTIME in the last 168 hours. Cardiac Enzymes: No results for input(s): CKTOTAL, CKMB, CKMBINDEX, TROPONINI in the last 168 hours. BNP (last 3 results) No results for input(s): PROBNP in the last 8760 hours. HbA1C: No results for input(s): HGBA1C in the last 72 hours. CBG: Recent Labs  Lab 10/18/17 1207 10/18/17 1617 10/18/17 2151 10/19/17 0725 10/19/17 1115  GLUCAP 86 77 82 77 76   Lipid Profile: No results for input(s): CHOL, HDL, LDLCALC, TRIG, CHOLHDL, LDLDIRECT in the last 72 hours. Thyroid Function Tests: No results for input(s): TSH, T4TOTAL, FREET4, T3FREE, THYROIDAB in the last 72 hours. Anemia Panel: No results for input(s): VITAMINB12, FOLATE, FERRITIN, TIBC, IRON, RETICCTPCT in the last 72 hours. Urine analysis:    Component Value Date/Time   COLORURINE YELLOW 10/15/2017 2237   APPEARANCEUR CLOUDY (A) 10/15/2017 2237   LABSPEC 1.017 10/15/2017 2237   PHURINE 5.0 10/15/2017 2237   GLUCOSEU NEGATIVE 10/15/2017 2237   HGBUR LARGE (A) 10/15/2017 2237   BILIRUBINUR NEGATIVE 10/15/2017 2237   KETONESUR 5 (A) 10/15/2017 2237   PROTEINUR 30 (A) 10/15/2017 2237   UROBILINOGEN 1.0 05/18/2015 1407   NITRITE NEGATIVE 10/15/2017 2237   LEUKOCYTESUR LARGE (A) 10/15/2017 2237   Sepsis Labs: Invalid input(s): PROCALCITONIN, LACTICIDVEN  Recent Results (from the past 240 hour(s))  Culture, blood (x 2)     Status: None (Preliminary result)   Collection Time: 10/16/17  4:30 AM  Result Value Ref Range Status   Specimen Description BLOOD  LEFT ARM  Final   Special Requests   Final    Blood Culture adequate volume BOTTLES DRAWN AEROBIC AND ANAEROBIC   Culture   Final    NO GROWTH 2 DAYS Performed at [facility], [address], [place], [place] [address]    Report Status PENDING  Incomplete  Culture, blood (x 2)     Status: None (Preliminary result)   Collection Time: 10/16/17  4:30 AM  Result Value Ref Range Status   Specimen Description BLOOD LEFT ARM  Final   Special Requests Blood Culture adequate volume IN PEDIATRIC BOTTLE  Final   Culture   Final    NO GROWTH 2 DAYS Performed at [facility], [place] [address], [place], [place] [address]    Report Status PENDING  Incomplete  MRSA PCR Screening     Status: None   Collection Time: 10/17/17  7:36 AM  Result Value Ref Range Status   MRSA  by PCR NEGATIVE NEGATIVE Final    Comment:        The GeneXpert MRSA Assay (FDA approved for NASAL specimens only), is one component of a comprehensive MRSA colonization surveillance program. It is not intended to diagnose MRSA infection nor to guide or monitor treatment for MRSA infections. Performed at [facility], [place] [address], [place], [place] [address]       Radiology Studies: No results found.   Scheduled Meds: . aspirin  325 mg Oral Daily  . atorvastatin  20 mg Oral q1800  . collagenase   Topical Daily  . ferrous sulfate  325 mg Oral TID WC  . fluticasone  2 spray Each Nare Daily  . heparin  5,000 Units Subcutaneous Q8H  . insulin glargine  10 Units Subcutaneous Q2200  . metroNIDAZOLE  500 mg Oral Q8H  . mirtazapine  7.5 mg Oral QHS  . multivitamin  1 tablet Oral QHS  . pantoprazole  40 mg Oral Q0600  . risperiDONE  1 mg Oral QHS  . senna  2 tablet Oral Q8H   Continuous Infusions: . cefTRIAXone (ROCEPHIN)  IV Stopped (10/19/17 0141)     Marzetta Board, MD, PhD [facility] Pager [phone]  If 7PM-7AM, please contact night-coverage [email or web address] Password  [facility] 10/19/2017, 2:33 PM

## 2017-10-20 DIAGNOSIS — N170 Acute kidney failure with tubular necrosis: Secondary | ICD-10-CM

## 2017-10-20 DIAGNOSIS — Z7189 Other specified counseling: Secondary | ICD-10-CM

## 2017-10-20 DIAGNOSIS — Z515 Encounter for palliative care: Secondary | ICD-10-CM

## 2017-10-20 LAB — GLUCOSE, CAPILLARY
GLUCOSE-CAPILLARY: 98 mg/dL (ref 65–99)
GLUCOSE-CAPILLARY: 96 mg/dL (ref 65–99)
Glucose-Capillary: 64 mg/dL — ABNORMAL LOW (ref 65–99)

## 2017-10-20 MED ORDER — LORAZEPAM 2 MG/ML IJ SOLN: 1.0000 mg | INTRAMUSCULAR | Status: DC | PRN

## 2017-10-20 MED ORDER — MORPHINE SULFATE (CONCENTRATE) 10 MG/0.5ML PO SOLN: 5.0000 mg | ORAL | Status: DC | PRN

## 2017-10-20 MED ORDER — SODIUM CHLORIDE 0.9 % IV SOLN: 250.0000 mL | INTRAVENOUS | Status: DC | PRN

## 2017-10-20 MED ORDER — SODIUM CHLORIDE 0.9% FLUSH
3.0000 mL | Freq: Two times a day (BID) | INTRAVENOUS | Status: DC
  Administered 2017-10-21 – 2017-10-25 (×7): 3 mL via INTRAVENOUS

## 2017-10-20 MED ORDER — ONDANSETRON 4 MG PO TBDP: 4.0000 mg | ORAL_TABLET | Freq: Four times a day (QID) | ORAL | Status: DC | PRN

## 2017-10-20 MED ORDER — GLYCOPYRROLATE 0.2 MG/ML IJ SOLN
0.2000 mg | INTRAMUSCULAR | Status: DC | PRN
  Filled 2017-10-20: qty 1

## 2017-10-20 MED ORDER — ONDANSETRON HCL 4 MG/2ML IJ SOLN: 4.0000 mg | Freq: Four times a day (QID) | INTRAMUSCULAR | Status: DC | PRN

## 2017-10-20 MED ORDER — POLYVINYL ALCOHOL 1.4 % OP SOLN
1.0000 [drp] | Freq: Four times a day (QID) | OPHTHALMIC | Status: DC | PRN
  Filled 2017-10-20: qty 15

## 2017-10-20 MED ORDER — BIOTENE DRY MOUTH MT LIQD: 15.0000 mL | OROMUCOSAL | Status: DC | PRN

## 2017-10-20 MED ORDER — SODIUM CHLORIDE 0.9% FLUSH: 3.0000 mL | INTRAVENOUS | Status: DC | PRN

## 2017-10-20 MED ORDER — MORPHINE SULFATE (CONCENTRATE) 10 MG/0.5ML PO SOLN: 5.0000 mg | Freq: Four times a day (QID) | ORAL | Status: DC

## 2017-10-20 MED ORDER — LORAZEPAM 1 MG PO TABS: 1.0000 mg | ORAL_TABLET | ORAL | Status: DC | PRN

## 2017-10-20 MED ORDER — MORPHINE SULFATE (CONCENTRATE) 10 MG/0.5ML PO SOLN
10.0000 mg | Freq: Three times a day (TID) | ORAL | Status: DC
  Administered 2017-10-20 (×2): 10 mg via ORAL
  Filled 2017-10-20 (×2): qty 0.5

## 2017-10-20 MED ORDER — HALOPERIDOL LACTATE 5 MG/ML IJ SOLN: 0.5000 mg | INTRAMUSCULAR | Status: DC | PRN

## 2017-10-20 MED ORDER — GLYCOPYRROLATE 1 MG PO TABS
1.0000 mg | ORAL_TABLET | ORAL | Status: DC | PRN
  Filled 2017-10-20: qty 1

## 2017-10-20 MED ORDER — LORAZEPAM 2 MG/ML PO CONC: 1.0000 mg | ORAL | Status: DC | PRN

## 2017-10-20 MED ORDER — HALOPERIDOL 0.5 MG PO TABS
0.5000 mg | ORAL_TABLET | ORAL | Status: DC | PRN
  Filled 2017-10-20: qty 1

## 2017-10-20 MED ORDER — HALOPERIDOL LACTATE 2 MG/ML PO CONC
0.5000 mg | ORAL | Status: DC | PRN
  Filled 2017-10-20: qty 0.3

## 2017-10-20 NOTE — Progress Notes (Signed)
PROGRESS NOTE  [name] DTO:[id number] DOB: November 04, 1933 DOA: 10/15/2017 PCP: [name], PA   LOS: 4 days   Brief Narrative / Interim history: 83 year old male with history of a flutter/fib, not on anticoagulation because of GI bleed, anemia, chronic diastolic CHF, chronic kidney disease stage IV, coronary artery disease, prior CVA essentially bedbound and minimally verbal who presents with abnormal renal function and leukocytosis from his SNF.  Assessment & Plan: Principal Problem:   UTI (urinary tract infection) Active Problems:   Chronic diastolic CHF (congestive heart failure) (HCC)   PAF (paroxysmal atrial fibrillation) (HCC)   Stroke (cerebrum) (HCC)   HTN (hypertension)   Acute renal failure superimposed on stage 4 chronic kidney disease (HCC)   Iron deficiency anemia due to chronic blood loss   Hyperkalemia   Type II diabetes mellitus with renal manifestations (HCC)   GERD (gastroesophageal reflux disease)   CAD (coronary artery disease)   Sepsis ([place])   Sacral decubitus ulcer, stage IV (HCC)   Sepsis likely due to urinary tract infection -Patient started on ceftriaxone, continue -Urine cultures unfortunately auto-cancelled by lab, and blood cultures are pending, so far no growth @ day 3 -MRSA PCR negative, no need for Vancomycin  Chronic diastolic CHF -Seems to be compensated, 2D echo in December 2018 showed an EF of 60% -sepsis physiology resolved  PAF and a flutter -Not a good candidate for anti-cognition due to history of GI bleed, continue rate control  Hypertension -BP 123/50, continue to hole clonidine, Norvasc and oral hydralazine  Acute kidney injury on chronic kidney disease stage IV -Creatinine is now improving  Prior CVAs -Patient minimally verbal, essentially bedbound  Stage IV sacral decubitus ulcer -Wound care consulted, wound appears necrotic with potentially needing debridement, I am not sure if he is a good surgical candidate.    -For now hydrotherapy and antibiotics, continue Ceftriaxone / Metronidazole -general surgery consulted -palliative consulted as well, appreciate input  Anemia in the setting of chronic kidney disease -Hemoglobin stable, monitor, no bleeding   DVT prophylaxis: heparin Code Status: Full code Family Communication: no family at bedside    Disposition Plan: SNF when ready   Consultants:   Palliative  General surgery   Procedures:   Hydrotherapy   Antimicrobials:  Ceftriaxone 2/21 >>   Metronidazole 2/22 >>  Subjective: -no complaints this morning, but not interacting much   Objective: Vitals:   10/19/17 1700 10/19/17 2137 10/20/17 0300 10/20/17 0907  BP: (!) 122/56 (!) 131/52 (!) 126/43 (!) 132/48  Pulse: 66 65 63 64  Resp: 18 17 15 16   Temp: 98.6 F (37 C) 98.1 F (36.7 C) 98.4 F (36.9 C) 98.3 F (36.8 C)  TempSrc: Oral Oral Oral Oral  SpO2: 100% 100% 100% 98%  Weight:      Height:        Intake/Output Summary (Last 24 hours) at 10/20/2017 1324 Last data filed at 10/20/2017 1003 Gross per 24 hour  Intake 240 ml  Output 750 ml  Net -510 ml   Filed Weights   10/15/17 2220  Weight: 90.7 kg (200 lb)    Examination:  Constitutional: NAD Respiratory: CTA biL  Cardiovascular: RRR   Data Reviewed: I have independently reviewed following labs and imaging studies   CBC: Recent Labs  Lab 10/16/17 0026 10/16/17 0430 10/17/17 0416 10/19/17 0922  WBC 22.3* 21.0* 13.4* 11.7*  NEUTROABS 19.8*  --   --   --   HGB 9.5* 9.5* 8.8* 8.8*  HCT 28.6* 30.5* 27.9*  27.1*  MCV 87.5 88.2 88.3 87.7  PLT 241 178 167 010*   Basic Metabolic Panel: Recent Labs  Lab 10/16/17 0026 10/16/17 0430 10/17/17 0416 10/18/17 1430 10/19/17 0922  NA 135 136 140 143 141  K 5.8* 4.6 3.4* 2.5* 3.9  CL 109 107 112* 111 119*  CO2 9* 11* 11* 11* 12*  GLUCOSE 112* 96 86 84 68  BUN 94* 93* 82* 77* 75*  CREATININE 4.61* 4.57* 4.11* 3.71* 3.59*  CALCIUM 8.1* 8.0* 7.9* 6.8*  7.3*  MG  --   --   --   --  1.2*   GFR: Estimated Creatinine Clearance: 17.7 mL/min (A) (by C-G formula based on SCr of 3.59 mg/dL (H)). Liver Function Tests: Recent Labs  Lab 10/16/17 0026  AST 12*  ALT <5*  ALKPHOS 110  BILITOT 1.5*  PROT 5.8*  ALBUMIN 2.5*   No results for input(s): LIPASE, AMYLASE in the last 168 hours. No results for input(s): AMMONIA in the last 168 hours. Coagulation Profile: No results for input(s): INR, PROTIME in the last 168 hours. Cardiac Enzymes: No results for input(s): CKTOTAL, CKMB, CKMBINDEX, TROPONINI in the last 168 hours. BNP (last 3 results) No results for input(s): PROBNP in the last 8760 hours. HbA1C: No results for input(s): HGBA1C in the last 72 hours. CBG: Recent Labs  Lab 10/19/17 1115 10/19/17 1637 10/19/17 2137 10/20/17 0742 10/20/17 1217  GLUCAP 76 94 84 98 96   Lipid Profile: No results for input(s): CHOL, HDL, LDLCALC, TRIG, CHOLHDL, LDLDIRECT in the last 72 hours. Thyroid Function Tests: No results for input(s): TSH, T4TOTAL, FREET4, T3FREE, THYROIDAB in the last 72 hours. Anemia Panel: No results for input(s): VITAMINB12, FOLATE, FERRITIN, TIBC, IRON, RETICCTPCT in the last 72 hours. Urine analysis:    Component Value Date/Time   COLORURINE YELLOW 10/15/2017 2237   APPEARANCEUR CLOUDY (A) 10/15/2017 2237   LABSPEC 1.017 10/15/2017 2237   PHURINE 5.0 10/15/2017 2237   GLUCOSEU NEGATIVE 10/15/2017 2237   HGBUR LARGE (A) 10/15/2017 2237   BILIRUBINUR NEGATIVE 10/15/2017 2237   KETONESUR 5 (A) 10/15/2017 2237   PROTEINUR 30 (A) 10/15/2017 2237   UROBILINOGEN 1.0 05/18/2015 1407   NITRITE NEGATIVE 10/15/2017 2237   LEUKOCYTESUR LARGE (A) 10/15/2017 2237   Sepsis Labs: Invalid input(s): PROCALCITONIN, LACTICIDVEN  Recent Results (from the past 240 hour(s))  Culture, blood (x 2)     Status: None (Preliminary result)   Collection Time: 10/16/17  4:30 AM  Result Value Ref Range Status   Specimen Description  BLOOD LEFT ARM  Final   Special Requests   Final    Blood Culture adequate volume BOTTLES DRAWN AEROBIC AND ANAEROBIC   Culture   Final    NO GROWTH 3 DAYS Performed at [facility], [address], [place], [place] [address]    Report Status PENDING  Incomplete  Culture, blood (x 2)     Status: None (Preliminary result)   Collection Time: 10/16/17  4:30 AM  Result Value Ref Range Status   Specimen Description BLOOD LEFT ARM  Final   Special Requests Blood Culture adequate volume IN PEDIATRIC BOTTLE  Final   Culture   Final    NO GROWTH 3 DAYS Performed at [facility], [place] [address] [place], [place] [address]    Report Status PENDING  Incomplete  MRSA PCR Screening     Status: None   Collection Time: 10/17/17  7:36 AM  Result Value Ref Range Status  MRSA by PCR NEGATIVE NEGATIVE Final    Comment:        The GeneXpert MRSA Assay (FDA approved for NASAL specimens only), is one component of a comprehensive MRSA colonization surveillance program. It is not intended to diagnose MRSA infection nor to guide or monitor treatment for MRSA infections. Performed at [facility], [place] [address], [place], [place] [address]       Radiology Studies: No results found.   Scheduled Meds: . aspirin  325 mg Oral Daily  . atorvastatin  20 mg Oral q1800  . collagenase   Topical Daily  . ferrous sulfate  325 mg Oral TID WC  . fluticasone  2 spray Each Nare Daily  . heparin  5,000 Units Subcutaneous Q8H  . insulin glargine  10 Units Subcutaneous Q2200  . metroNIDAZOLE  500 mg Oral Q8H  . mirtazapine  7.5 mg Oral QHS  . multivitamin  1 tablet Oral QHS  . pantoprazole  40 mg Oral Q0600  . risperiDONE  1 mg Oral QHS  . senna  2 tablet Oral Q8H   Continuous Infusions: . cefTRIAXone (ROCEPHIN)  IV Stopped (10/20/17 0258)     Marzetta Board, MD, PhD [facility] Pager [phone]  If 7PM-7AM, please contact night-coverage [email or web address] Password  [email or web address] 10/20/2017, 1:24 PM

## 2017-10-20 NOTE — Progress Notes (Signed)
Daily Progress Note   Patient Name: [name]       Date: 10/20/2017 DOB: Jan 03, 1934  Age: 84 y.o. MRN#: [id number] Attending Physician: Caren Griffins, MD Primary Care Physician: [name], [place] Admit Date: 10/15/2017  Reason for Consultation/Follow-up: Establishing goals of care  Subjective: Met with Tami at bedside.  After examining her father and speaking with the bedside RN, Tami and I went to a conference room.   The RN reported the patient has had no oral intake and he is interacting less with staff today.  His wound does not appear to be healing.  Tami and I discussed sending her father to SNF vs to [facility].  Tami feels her father would suffer at a nursing home.  She cries and tells me she does not want him to suffer any longer.  She realizes that he is no longer eating and that he is near end of life.    Tami cried and stated that if her father dies she will be all alone.  I added Belva Chimes (family friend) on by speaker phone in order to give Tami support and confirm her decision.  Coralyn Mark was certain in his comments that "Abe People" is near end of life and [facility] is the best place for him.  I consoled Tami and attempted to provide her support for the terrible loss she is feeling.   Assessment: 84 yo male malnourished, kidney failure, non healing stage 4 wound - refusing to eat.   Patient Profile/HPI:  84 y.o. male  with past medical history of GIB, colon cancer, DM, CHF, Afib/flutter, CKD IV, CVA (bed bound), and stg 4 sacral decubitus who was admitted on 10/15/2017 with sepsis secondary to UTI.    Length of Stay: 4  Current Medications: Scheduled Meds:  . collagenase   Topical Daily  . fluticasone  2 spray Each Nare Daily  . metroNIDAZOLE  500 mg Oral Q8H   . mirtazapine  7.5 mg Oral QHS  . morphine CONCENTRATE  10 mg Oral TID  . pantoprazole  40 mg Oral Q0600  . risperiDONE  1 mg Oral QHS  . senna  2 tablet Oral Q8H  . sodium chloride flush  3 mL Intravenous Q12H    Continuous Infusions: . sodium chloride    . cefTRIAXone (ROCEPHIN)  IV Stopped (10/20/17 0258)    PRN Meds: sodium chloride, acetaminophen **OR** acetaminophen, antiseptic oral rinse, benzonatate, glycopyrrolate **OR** glycopyrrolate **OR** glycopyrrolate, haloperidol **OR** haloperidol **OR** haloperidol lactate, LORazepam **OR** LORazepam **OR** LORazepam, morphine CONCENTRATE **OR** morphine CONCENTRATE, ondansetron **OR** ondansetron (ZOFRAN) IV, ondansetron **OR** ondansetron (ZOFRAN) IV, polyvinyl alcohol, sodium chloride flush, zolpidem  Physical Exam        Well developed chronically ill appearing male.  Sleeping in bed. He awakens to touch but his speech is unintelligible.   Lunch tray at bedside untouched.    Vital Signs: BP (!) 132/48 (BP Location: Right Arm)   Pulse 64   Temp 98.3 F (36.8 C) (Oral)   Resp 16   Ht 6' (1.829 m)   Wt 90.7 kg (200 lb)   SpO2 98%   BMI 27.12 kg/m  SpO2: SpO2: 98 % O2 Device: O2 Device: Not Delivered O2 Flow Rate:    Intake/output summary:   Intake/Output Summary (Last 24 hours) at 10/20/2017 1624 Last data filed at 10/20/2017 1335 Gross per 24 hour  Intake 320 ml  Output 500 ml  Net -180 ml   LBM: Last BM Date: 08/18/18 Baseline Weight: Weight: 90.7 kg (200 lb) Most recent weight: Weight: 90.7 kg (200 lb)       Palliative Assessment/Data: 20%      Patient Active Problem List   Diagnosis Date Noted  . Palliative care encounter   . UTI (urinary tract infection) 10/16/2017  . Sepsis ([place]) 10/16/2017  . Sacral decubitus ulcer, stage IV ([place]) 10/16/2017  . Hyperkalemia 08/05/2017  . Type II diabetes mellitus with renal manifestations ([place]) 08/05/2017  . GERD (gastroesophageal reflux disease) 08/05/2017  .  Depression 08/05/2017  . CAD (coronary artery disease) 08/05/2017  . TIA (transient ischemic attack) 08/05/2017  . Atrial flutter, chronic ([place])   . Chronic renal failure, stage 4 (severe) (HCC)   . Anger reaction 12/04/2016  . Flat affect 12/04/2016  . Iron deficiency anemia due to chronic blood loss 12/02/2016  . GI bleeding 11/27/2016  . Acute renal failure superimposed on stage 4 chronic kidney disease ([place]) 11/27/2016  . Pressure injury of skin 09/18/2016  . Leukocytosis   . Mobitz type 1 second degree AV block 09/11/2016  . Pericardial effusion- moderate on CT scan 09/11/2016  . HTN (hypertension) 09/05/2016  . Acute urinary retention 09/05/2016  . Severe obesity (BMI >= 40) ([place]) 09/05/2016  . Stroke (cerebrum) ([place]) 12/09/2015  . Bradycardia 12/08/2015  . Fall 12/08/2015  . Pancytopenia ([place]) 12/08/2015  . Atrial flutter ([place]) 08/29/2015  . Hypoxia 08/28/2015  . PAF (paroxysmal atrial fibrillation) ([place])   . Noncompliance with medications 08/03/2015  . Chronic diastolic CHF (congestive heart failure) ([place]) 07/31/2015  . Cellulitis of left lower extremity 07/31/2015  . Uncontrolled diabetes mellitus with diabetic nephropathy, with long-term current use of insulin ([place]) 07/31/2015  . Accelerated hypertension 07/31/2015  . Thrombocytopenia ([place]) 07/31/2015  . Lymphedema 06/25/2011    Palliative Care Plan    Recommendations/Plan:  Will scheduled roxanol TID in addition to PRN.   Will d/c interventions not related to comfort and add PRN comfort meds  Social work consult placed for [employer].  Tami requests [employer].  Goals of Care and Additional Recommendations:  Limitations on Scope of Treatment: Full Comfort Care  Code Status:  DNR  Prognosis:   < 2 weeks.  Malnourished, no longer eating, bedbound and minimally verbal after CVA, stage 4 wound. Now with advanced renal failure.   Discharge Planning:  Hospice facility  Care plan was discussed with  [facility] MD, bedside RN Tech and RN, family.  Thank you for allowing the Palliative Medicine Team to assist in the care of this patient.  Total time spent:  64 min.     Greater than 50%  of this time was spent counseling and coordinating care related to the above assessment and plan.  Florentina Jenny, PA-C Palliative Medicine  Please contact Palliative MedicineTeam phone at [phone] for questions and concerns between 7 am - 7 pm.   Please see AMION for individual provider pager numbers.

## 2017-10-20 NOTE — Care Management Important Message (Signed)
Important Message  Patient Details  Name: [name] MRN: [id number] Date of Birth: 1934/05/28   Medicare Important Message Given:  Other (see comment)  Due to illness patient not able to sign/Unsign copy left with patient belongings   Orbie Pyo 10/20/2017, 1:18 PM

## 2017-10-20 NOTE — Progress Notes (Signed)
Patient refused to eat any of his lunch.

## 2017-10-20 NOTE — Progress Notes (Signed)
Physical Therapy Wound Treatment Patient Details  Name: [name] MRN: [id number] Date of Birth: 07-18-1934  Today's Date: 10/20/2017 Time: 8338-2505 Time Calculation (min): 27 min  Subjective  Subjective: Pt groaned when told it was time for hydrothearpy however was cooperative in rolling over Patient and Family Stated Goals: None stated  Pain Score:  Pt premedicated before treatment, however moved evasively during pulsed-lavage, based on facial expression 6/10 pain   Wound Assessment  Pressure Injury 10/16/17 Stage IV - Full thickness tissue loss with exposed bone, tendon or muscle. WOC assessment in ED 10/16/17 (Active)  Dressing Type ABD;Barrier Film (skin prep);Gauze (Comment);Moist to dry 10/20/2017  3:00 PM  Dressing Changed;Clean;Dry;Intact 10/20/2017  3:00 PM  Dressing Change Frequency Daily 10/20/2017  3:00 PM  State of Healing Eschar 10/20/2017  3:00 PM  Site / Wound Assessment Yellow;Black 10/20/2017  3:00 PM  % Wound base Red or Granulating 0% 10/20/2017  3:00 PM  % Wound base Yellow/Fibrinous Exudate 40% 10/20/2017  3:00 PM  % Wound base Black/Eschar 60% 10/20/2017  3:00 PM  % Wound base Other/Granulation Tissue (Comment) 0% 10/20/2017  3:00 PM  Peri-wound Assessment Intact 10/20/2017  3:00 PM  Wound Length (cm) 6 cm 10/18/2017 10:09 AM  Wound Width (cm) 4.1 cm 10/18/2017 10:09 AM  Wound Depth (cm) 1.8 cm 10/18/2017 10:09 AM  Wound Surface Area (cm^2) 24.6 cm^2 10/18/2017 10:09 AM  Wound Volume (cm^3) 44.28 cm^3 10/18/2017 10:09 AM  Undermining (cm) 2.5 cm from 12-2:00; 1.5 cm from 2-12:00 10/18/2017 10:09 AM  Margins Unattached edges (unapproximated) 10/20/2017  3:00 PM  Drainage Amount Moderate 10/20/2017  3:00 PM  Drainage Description Purulent;Odor 10/20/2017  3:00 PM  Treatment Debridement (Selective);Cleansed;Hydrotherapy (Pulse lavage);Packing (Saline gauze) 10/20/2017  3:00 PM   Santyl applied to wound bed prior to dressing    Hydrotherapy Pulsed lavage therapy - wound  location: sacrum Pulsed Lavage with Suction (psi): 8 psi Pulsed Lavage with Suction - Normal Saline Used: 1000 mL Pulsed Lavage Tip: Tip with splash shield Selective Debridement Selective Debridement - Location: sacrum Selective Debridement - Tools Used: Forceps;Scalpel Selective Debridement - Tissue Removed: black and yellow necrotic/unviable tissue   Wound Assessment and Plan  Wound Therapy - Assess/Plan/Recommendations Wound Therapy - Clinical Statement: Pt will benefit from continued to pulsed-lavage and selective debridement for decreased bioburden and promote wound bed healing.  Wound Therapy - Functional Problem List: Decreased tolerance for sitting OOB or positioning on back due to wound Factors Delaying/Impairing Wound Healing: Diabetes Mellitus;Immobility;Multiple medical problems;Polypharmacy Hydrotherapy Plan: Debridement;Dressing change;Patient/family education;Pulsatile lavage with suction Wound Therapy - Frequency: 6X / week Wound Therapy - Follow Up Recommendations: Skilled nursing facility Wound Plan: See above  Wound Therapy Goals- Improve the function of patient's integumentary system by progressing the wound(s) through the phases of wound healing (inflammation - proliferation - remodeling) by: Decrease Necrotic Tissue to: 20% Decrease Necrotic Tissue - Progress: Progressing toward goal Increase Granulation Tissue to: 80% Increase Granulation Tissue - Progress: Progressing toward goal Goals/treatment plan/discharge plan were made with and agreed upon by patient/family: Yes Time For Goal Achievement: 7 days Wound Therapy - Potential for Goals: Good  Goals will be updated until maximal potential achieved or discharge criteria met.  Discharge criteria: when goals achieved, discharge from hospital, MD decision/surgical intervention, no progress towards goals, refusal/missing three consecutive treatments without notification or medical reason.  GP    Dani Gobble. Migdalia Dk PT, DPT Acute Rehabilitation  [phone] Pager [phone]   [place] 10/20/2017, 3:57  PM   

## 2017-10-20 NOTE — Clinical Social Work Note (Addendum)
CSW met with patient's daughter at bedside. She confirmed patient is a long-term resident at [facility] but she wants long-term placement at [facility]. CSW left message for admissions coordinator to check on bed availability and sent referral.  Dayton Scrape, [place]

## 2017-10-21 DIAGNOSIS — I2510 Atherosclerotic heart disease of native coronary artery without angina pectoris: Secondary | ICD-10-CM

## 2017-10-21 DIAGNOSIS — Z515 Encounter for palliative care: Secondary | ICD-10-CM

## 2017-10-21 DIAGNOSIS — Z7189 Other specified counseling: Secondary | ICD-10-CM

## 2017-10-21 LAB — CULTURE, BLOOD (ROUTINE X 2)
CULTURE: NO GROWTH
Culture: NO GROWTH
SPECIAL REQUESTS: ADEQUATE
Special Requests: ADEQUATE

## 2017-10-21 MED ORDER — LORAZEPAM 0.5 MG PO TABS
0.5000 mg | ORAL_TABLET | Freq: Two times a day (BID) | ORAL | Status: DC
Start: 2017-10-21 — End: 2017-10-22
  Administered 2017-10-22: 0.5 mg via ORAL
  Filled 2017-10-21: qty 1

## 2017-10-21 MED ORDER — HALOPERIDOL LACTATE 2 MG/ML PO CONC: 1.0000 mg | ORAL | 0 refills | Status: DC | PRN

## 2017-10-21 MED ORDER — MORPHINE SULFATE (CONCENTRATE) 10 MG/0.5ML PO SOLN: 10.0000 mg | Freq: Four times a day (QID) | ORAL | 0 refills | Status: DC

## 2017-10-21 MED ORDER — MORPHINE SULFATE (CONCENTRATE) 10 MG/0.5ML PO SOLN
10.0000 mg | Freq: Four times a day (QID) | ORAL | Status: DC
  Administered 2017-10-21: 10 mg via ORAL
  Filled 2017-10-21: qty 0.5

## 2017-10-21 MED ORDER — MORPHINE SULFATE (CONCENTRATE) 10 MG/0.5ML PO SOLN
15.0000 mg | Freq: Four times a day (QID) | ORAL | Status: DC
  Administered 2017-10-21 – 2017-10-22 (×2): 15 mg via ORAL
  Filled 2017-10-21 (×2): qty 1

## 2017-10-21 MED ORDER — LORAZEPAM 2 MG/ML PO CONC: 1.0000 mg | ORAL | 0 refills | Status: DC | PRN

## 2017-10-21 MED ORDER — MORPHINE SULFATE (CONCENTRATE) 10 MG/0.5ML PO SOLN: 10.0000 mg | Freq: Four times a day (QID) | ORAL | Status: DC

## 2017-10-21 NOTE — Consult Note (Signed)
WOC weekly follow up. Surgery was consulted last week after my consultation.  Several palliative care meetings with family.  Discussed with Dr. Renne Crigler this am. Patient is being transitioned to comfort care only. Will sign off   [place], [place], [place]

## 2017-10-21 NOTE — Discharge Summary (Signed)
Physician Discharge Summary  [name] LOV:[id number] DOB: 07-22-1934 DOA: 10/15/2017  PCP: [name], PA  Admit date: 10/15/2017 Discharge date: 10/21/2017  Admitted From: long term SNF Disposition:  Residential hospice  Recommendations for Outpatient Follow-up:  Follow up with  hospice  Discharge Condition: guarded CODE STATUS: DNR Diet recommendation: comfort feeding   HPI: Per Dr. Herma Carson is a 84 y.o. male with medical history significant of significant ofA flutter andatrial fibrillation not on anticoagulation because of GI bleed,anemia,colon cancer, chronic diastolic CHF, CKD-IV,coronary artery disease, previous stroke (bed bound and minimally verbal), depression, s/p of indwelling foley cath, whopresents with abnormal lab. Pt is almost nonverbal, and is unable to provide accurate medical history, therefore, most of the history is obtained by discussing the case with ED physician, per EMS report, and with the nursing staff. He can say yes or no.  Per report, pt is from [facility], called out due to abnormal lab. Pt was found to have worsening renal function and leukocytosis in facility. When I say pt in ED, no active cough, respiratory distress noted. No active nausea, vomiting, diarrhea noted. He he moves arms is slightly, and can barely move his legs. He has stage IV decubitus sacral ulcers and ulcers in both heels. He say no when asked if he has any CP or any pain anywhere.  Hospital Course: [place] -patient was admitted to the hospital septic with probable sources from urine vs sacral decubitus ulcers. He was started on broad spectrum antibiotics and his sepsis physiology resolved. Given bed-bound status, advanced renal failure, prior CVAs, necrotic sacral ulcer, poor QOL, minimal oral intake, palliative was consulted. After family discussions his care is being transitioned towards comfort and patient will be discharged to residential hospice.  Sepsis  likely due to urinary tract infection -Patient started on ceftriaxone initially, now discontinued Chronic diastolic CHF -Seems to be compensated PAF and a flutter -Not a good candidate for anti-cognition due to history of GI bleed Hypertension Acute kidney injury on chronic kidney disease stage IV  Prior CVAs -Patient minimally verbal, essentially bedbound Stage IV sacral decubitus ulcer -wound appears necrotic. General surgery was consulted however patient not a surgical candidate.  Anemia in the setting of chronic kidney disease   Discharge Diagnoses:  Principal Problem:   UTI (urinary tract infection) Active Problems:   Chronic diastolic CHF (congestive heart failure) (HCC)   PAF (paroxysmal atrial fibrillation) (HCC)   Stroke (cerebrum) (HCC)   HTN (hypertension)   Acute renal failure superimposed on stage 4 chronic kidney disease (HCC)   Iron deficiency anemia due to chronic blood loss   Hyperkalemia   Type II diabetes mellitus with renal manifestations (HCC)   GERD (gastroesophageal reflux disease)   CAD (coronary artery disease)   Sepsis ([place])   Sacral decubitus ulcer, stage IV ([place])   Palliative care encounter   Comfort measures only status   Encounter for hospice care discussion     Discharge Instructions   Allergies as of 10/21/2017      Reactions   Metformin And Related Other (See Comments)   Reaction: pt states he thinks he threw up      Medication List    STOP taking these medications   amLODipine 10 MG tablet Commonly known as:  NORVASC   aspirin 325 MG EC tablet   atorvastatin 20 MG tablet Commonly known as:  LIPITOR   b complex-vitamin c-folic acid 0.8 MG Tabs tablet   benzonatate 100 MG capsule Commonly known  as:  TESSALON   cloNIDine 0.1 mg/24hr patch Commonly known as:  CATAPRES - Dosed in mg/24 hr   ferrous sulfate 325 (65 FE) MG EC tablet   fluticasone 50 MCG/ACT nasal spray Commonly known as:  FLONASE   GLUCAGON EMERGENCY 1 MG  injection Generic drug:  glucagon   hydrALAZINE 25 MG tablet Commonly known as:  APRESOLINE   HYDROcodone-acetaminophen 5-325 MG tablet Commonly known as:  NORCO/VICODIN   LANTUS SOLOSTAR 100 UNIT/ML Solostar Pen Generic drug:  Insulin Glargine   pantoprazole 40 MG tablet Commonly known as:  PROTONIX   UNABLE TO FIND     TAKE these medications   acetaminophen 325 MG tablet Commonly known as:  TYLENOL Take 650 mg by mouth every 6 (six) hours.   haloperidol 2 MG/ML solution Commonly known as:  HALDOL Place 0.5 mLs (1 mg total) under the tongue every 4 (four) hours as needed for agitation (or delirium).   LORazepam 2 MG/ML concentrated solution Commonly known as:  ATIVAN Place 0.5 mLs (1 mg total) under the tongue every 4 (four) hours as needed for anxiety.   mirtazapine 7.5 MG tablet Commonly known as:  REMERON Take 7.5 mg by mouth at bedtime.   morphine CONCENTRATE 10 MG/0.5ML Soln concentrated solution Take 0.5 mLs (10 mg total) by mouth every 6 (six) hours.   risperiDONE 1 MG/ML oral solution Commonly known as:  RISPERDAL Take 1 mg by mouth at bedtime.   senna 8.6 MG Tabs tablet Commonly known as:  SENOKOT Take 2 tablets by mouth every 8 (eight) hours.        Consultations:  General surgery   Palliative   Procedures/Studies:  Dg Chest Portable 1 View  Result Date: 10/15/2017 CLINICAL DATA:  Elevated white blood cell count. EXAM: PORTABLE CHEST 1 VIEW COMPARISON:  Radiograph 07/06/2017.  CT 09/10/2016 FINDINGS: Patient rotated to the right. Similar cardiomegaly to prior exam. Increased retrocardiac opacity and left pleural effusion. Right paratracheal density likely confluent vessels as seen on prior CT. No pulmonary edema. No pneumothorax. IMPRESSION: Left pleural effusion with increased retrocardiac opacity from prior exam, may be compressive atelectasis or pneumonia in the setting of elevated white blood cell count. Cardiomegaly is similar.  Electronically Signed   By: Jeb Levering M.D.   On: 10/15/2017 22:51      Subjective: -appears comfortable   Discharge Exam: Vitals:   10/20/17 1635 10/21/17 0910  BP: (!) 128/52 (!) 147/60  Pulse: 66 69  Resp: 16 18  Temp:  98.6 F (37 C)  SpO2: 99% 98%    General: no apparent disctress   The results of significant diagnostics from this hospitalization (including imaging, microbiology, ancillary and laboratory) are listed below for reference.     Microbiology: Recent Results (from the past 240 hour(s))  Culture, blood (x 2)     Status: None   Collection Time: 10/16/17  4:30 AM  Result Value Ref Range Status   Specimen Description BLOOD LEFT ARM  Final   Special Requests   Final    Blood Culture adequate volume BOTTLES DRAWN AEROBIC AND ANAEROBIC   Culture   Final    NO GROWTH 5 DAYS Performed at [facility], [address], [place], [place] [address]    Report Status 10/21/2017 FINAL  Final  Culture, blood (x 2)     Status: None   Collection Time: 10/16/17  4:30 AM  Result Value Ref Range Status   Specimen Description BLOOD LEFT ARM  Final  Special Requests Blood Culture adequate volume IN PEDIATRIC BOTTLE  Final   Culture   Final    NO GROWTH 5 DAYS Performed at [facility], [place] [address] [place], [place] [address]    Report Status 10/21/2017 FINAL  Final  MRSA PCR Screening     Status: None   Collection Time: 10/17/17  7:36 AM  Result Value Ref Range Status   MRSA by PCR NEGATIVE NEGATIVE Final    Comment:        The GeneXpert MRSA Assay (FDA approved for NASAL specimens only), is one component of a comprehensive MRSA colonization surveillance program. It is not intended to diagnose MRSA infection nor to guide or monitor treatment for MRSA infections. Performed at [facility], [place] [address], [place], [place] [address]      Labs: BNP (last 3 results) Recent Labs    11/27/16 1736 08/05/17 0409 10/16/17 0430  BNP  361.4* 179.2* 500.9*   Basic Metabolic Panel: Recent Labs  Lab 10/16/17 0026 10/16/17 0430 10/17/17 0416 10/18/17 1430 10/19/17 0922  NA 135 136 140 143 141  K 5.8* 4.6 3.4* 2.5* 3.9  CL 109 107 112* 111 119*  CO2 9* 11* 11* 11* 12*  GLUCOSE 112* 96 86 84 68  BUN 94* 93* 82* 77* 75*  CREATININE 4.61* 4.57* 4.11* 3.71* 3.59*  CALCIUM 8.1* 8.0* 7.9* 6.8* 7.3*  MG  --   --   --   --  1.2*   Liver Function Tests: Recent Labs  Lab 10/16/17 0026  AST 12*  ALT <5*  ALKPHOS 110  BILITOT 1.5*  PROT 5.8*  ALBUMIN 2.5*   No results for input(s): LIPASE, AMYLASE in the last 168 hours. No results for input(s): AMMONIA in the last 168 hours. CBC: Recent Labs  Lab 10/16/17 0026 10/16/17 0430 10/17/17 0416 10/19/17 0922  WBC 22.3* 21.0* 13.4* 11.7*  NEUTROABS 19.8*  --   --   --   HGB 9.5* 9.5* 8.8* 8.8*  HCT 28.6* 30.5* 27.9* 27.1*  MCV 87.5 88.2 88.3 87.7  PLT 241 178 167 133*   Cardiac Enzymes: No results for input(s): CKTOTAL, CKMB, CKMBINDEX, TROPONINI in the last 168 hours. BNP: Invalid input(s): POCBNP CBG: Recent Labs  Lab 10/19/17 1115 10/19/17 1637 10/19/17 2137 10/20/17 0742 10/20/17 1217  GLUCAP 76 94 84 98 96   D-Dimer No results for input(s): DDIMER in the last 72 hours. Hgb A1c No results for input(s): HGBA1C in the last 72 hours. Lipid Profile No results for input(s): CHOL, HDL, LDLCALC, TRIG, CHOLHDL, LDLDIRECT in the last 72 hours. Thyroid function studies No results for input(s): TSH, T4TOTAL, T3FREE, THYROIDAB in the last 72 hours.  Invalid input(s): FREET3 Anemia work up No results for input(s): VITAMINB12, FOLATE, FERRITIN, TIBC, IRON, RETICCTPCT in the last 72 hours. Urinalysis    Component Value Date/Time   COLORURINE YELLOW 10/15/2017 2237   APPEARANCEUR CLOUDY (A) 10/15/2017 2237   LABSPEC 1.017 10/15/2017 2237   PHURINE 5.0 10/15/2017 2237   GLUCOSEU NEGATIVE 10/15/2017 2237   HGBUR LARGE (A) 10/15/2017 2237   BILIRUBINUR  NEGATIVE 10/15/2017 2237   KETONESUR 5 (A) 10/15/2017 2237   PROTEINUR 30 (A) 10/15/2017 2237   UROBILINOGEN 1.0 05/18/2015 1407   NITRITE NEGATIVE 10/15/2017 2237   LEUKOCYTESUR LARGE (A) 10/15/2017 2237   Sepsis Labs Invalid input(s): PROCALCITONIN,  WBC,  LACTICIDVEN   Time coordinating discharge: 20 minutes  SIGNED:  Marzetta Board, MD  [facility] 10/21/2017, 1:50  PM Pager [phone]  If 7PM-7AM, please contact night-coverage [email or web address] Password [email or web address]

## 2017-10-21 NOTE — Progress Notes (Signed)
[facility]  Received VM request from PMT RN Threasa Beards this morning at 9:30 for family interest in [facility]. Spoke with CSW Lorriane Shire to make her aware [facility] does not have room to offer [name] at this time. Will continue to follow until disposition determined. Will update CSW Lorriane Shire if availability for this patient changes.   Thank you,  Erling Conte, LCSW [phone]

## 2017-10-21 NOTE — Progress Notes (Signed)
   10/21/17 1800  Clinical Encounter Type  Visited With Patient  Visit Type Initial  Referral From Nurse  Consult/Referral To Chaplain  Spiritual Encounters  Spiritual Needs Prayer  Stress Factors  Patient Stress Factors Exhausted  Family Stress Factors None identified  Patient is under comfort care. Patient does not communicate apart from eye contact. When Chaplain advised the patient that he was available to support any need he desired including prayer he blanked his eyes. Chaplain prayed with the patient.

## 2017-10-21 NOTE — Progress Notes (Signed)
Nutrition Brief Note  Chart reviewed. Pt now transitioning to comfort care. No further nutrition interventions warranted at this time.  Please re-consult as needed.    Veanna Dower MS, RD, LDN, CNSC [phone] Pager  [phone] Weekend/On-Call Pager     

## 2017-10-21 NOTE — Progress Notes (Addendum)
Palliative Medicine RN Note: Symptom check. PMT PA saw patient after SW visited, so SW was not aware of goal change and [facility] referral. I called the referral to rep Bevely Palmer.  Per staff, patient is having significant pain with movement. I have contacted PA Haynes Dage about scheduling Roxanol more frequently.   Plan f/u for symptoms later today if patient has not been d/c.  Marjie Skiff. Wade Sigala, RN, BSN, [facility] Palliative Medicine Team 10/21/2017 9:10 AM Office [phone]

## 2017-10-21 NOTE — Progress Notes (Signed)
Palliative Medicine RN Note: Updated daughter Sondra Barges on patient's new medication orders as placed by PA Haynes Dage. No bed at [facility] today. We discussed other inpt facilities; she is unable to get to any other facilities, as she takes the bus everywhere.  We are hopeful for a bed at [facility] soon. Both family and PMT PA acknowledge that discharging Mr [name] to a SNF with hospice will likely result in a readmission in less than 24 hours.   Marjie Skiff Chaunce Winkels, RN, BSN, [facility] Palliative Medicine Team 10/21/2017 2:27 PM Office [phone]

## 2017-10-21 NOTE — Clinical Social Work Note (Signed)
CSW talked with patient's daughter, Tristain Daily by phone [phone]) regarding discharge disposition for patient as he is now comfort care. Advised Ms. Fosnaugh that [facility] does not have a bed today and liaison not sure when a bed will be available and also the need to explore other venues of care. Daughter advised that [facility] currently has bed availability and transportation challenges discussed. Daughter agreeable to having [facility] evaluate her father for their facility and she was informed that CSW will f/u with her on Wednesday.  Call made to [place] and message left. CSW will f/u with [facility] and daughter on Wednesday.  Diani Jillson Givens, MSW, LCSW Licensed Clinical Social Worker [place] [phone]

## 2017-10-21 NOTE — Progress Notes (Signed)
Symptom check.  [name] is quite but grimacing.  When asked if he is comfortable he indicates that he is not.  Per bedside RN he has been quite and unable to eat all day.  Discussed with Palliative RN who has rounded on him twice today.   I understand [name] may have a bed for him today.  Palliative RN discussing with family [name]) on [phone].  Assessment:  84 y.o.malewith past medical history of GIB, colon cancer, DM, CHF, Afib/flutter, CKD IV, CVA (bed bound), and stg 4 sacral decubituswho was admitted on 2/20/2019with sepsis secondary to UTI.   No longer eating or drinking.  Family seeking full comfort care and Hospice House.  Recommendation:  Increase roxanol to 15 mg q 6 hours and add very low dose ativan 0.5 mg bid.  PRNs are also available if needed.  Family does not want their father in pain.    Plan is to d/c to [facility] as soon as a bed is available.  [name] Jenny, PA-C Palliative Medicine Pager: [phone]   Time 15 min.

## 2017-10-22 MED ORDER — LORAZEPAM 2 MG/ML IJ SOLN
0.5000 mg | Freq: Two times a day (BID) | INTRAMUSCULAR | Status: DC
  Administered 2017-10-22 – 2017-10-24 (×2): 0.5 mg via INTRAVENOUS
  Filled 2017-10-22 (×2): qty 1

## 2017-10-22 MED ORDER — MORPHINE SULFATE (PF) 2 MG/ML IV SOLN
2.0000 mg | Freq: Four times a day (QID) | INTRAVENOUS | Status: DC
  Administered 2017-10-22 – 2017-10-25 (×7): 2 mg via INTRAVENOUS
  Filled 2017-10-22 (×7): qty 1

## 2017-10-22 MED ORDER — MORPHINE SULFATE (CONCENTRATE) 10 MG/0.5ML PO SOLN
10.0000 mg | Freq: Four times a day (QID) | ORAL | Status: DC
  Administered 2017-10-22: 10 mg via ORAL
  Filled 2017-10-22: qty 0.5

## 2017-10-22 MED ORDER — MORPHINE SULFATE (PF) 2 MG/ML IV SOLN: 2.0000 mg | INTRAVENOUS | Status: DC | PRN

## 2017-10-22 NOTE — Progress Notes (Addendum)
Patient appears comfortable.  Sleeping soundly.  Food/drink not touched.  Will continue with current regimen for him.  Anticipate D/C to [facility] when bed available.  Addendum:  Per RN patient refusing oral medications (likely due to confusion).  Will d/c oral meds and utilize IV scheduled and PRN comfort medications.  Florentina Jenny, PA-C Palliative Medicine Pager: [phone]  15 min.

## 2017-10-22 NOTE — Progress Notes (Signed)
[facility]  Continue to follow for family interest in [facility]. Unfortunately [facility] does not have room to offer to Mr. [place] this morning. Message sent to [place]. Will continue to follow until disposition determined.   Thank you,  Erling Conte, LCSW [phone]

## 2017-10-22 NOTE — Clinical Social Work Note (Addendum)
Patient now comfort care and referral made to [facility] on 2/26 and [facility] on 2/27. [facility] currently has no bed availability. Per Nunzio Cory with [facility], the medical director reviewed patient's information and has denied Mr. [place] as he is not appropriate for GIP admission, and it is felt that his needs can be met at the SNF level of care.   Contact made with patient's daughter Jamear Carbonneau [phone]) and informed her of [facility] decision. Discharge disposition discussed and daughter agreeable to a SNF search for LTC bed, however will not send his information to [place] or [place] per daughter's request.  CSW also contacted financial counselor Lorriane Shire and was provided with patient's Medicaid number - [id number] O. CSW will continue to seek appropriate placement for patient and will provide SW intervention services to patient/daughter as needed.  CSW contacted the following facilities seeking LTC [facility]) SNF/Hospice placement for patient: *Juliann Pulse with [facility] will check on LTC bed availability. Gerald Stabs with [facility] does not have any LTC beds. [facility] does not have any LTC Medicaid beds *Olivia Mackie with [facility] indicated that they have a waiting list for LTC beds. Georges Lynch with [facility] indicated that they don't have any LTC beds at this time. [facility] contacted and message left. Freda Munro with [facility] with consult with Karlene Einstein and left me know. They do have LTC beds. * [facility] - left message for Kit, admissions director. [place] and left message for [employer], Engineer, site. *[facility] unable to offer due to patient's primary insurance. It billing Medicare for something is ever needed, they are not in contract with [employer]. *[facility] has no LTC Medicaid beds. [facility] contacted and message left. *Talked with Cathie Beams, Director of Clinical Transitions with [facility]), where patient is from informed CSW that patient's friend came to clean out his room. She also reported that patient owes [employer]rie Foreman and [employer] companies as the friend Sharlyn Bologna had not been paying the full patient liability amount each month. CSW talked later with [facility] to determine if they will consider taking patient back. She will talk with her Administrator and let this CSW know. [facility] contacted and message left for Michelle Piper, admissions director. [facility] contacted and have no LTC Medicaid beds. *Talked with Gayla Medicus with [facility] and she will she review patient's information and let CSW know. *Talked with Tammy at [facility] and no LTC Medicaid beds available. *Talked with SW at [facility] (admissions director out today). Information sent through [facility] and will be reviewed by nursing director. *[facility] contacted and clinicals transmitted to [place]. They have no beds right now. Neoma Laming with [facility] contacted and clinicals transmitted to facility. Doreatha Lew will review clinicals and contact CSW on Thursday. *Consulted with SW Surveyor, quantity Zack regarding patient and he advised that patient should return to [facility]. CSW advised Edwyna Ready of conversation with [place] regarding patient owing money to [employer] and [employer] and friend Belva Chimes not turning over full check each month. [facility] contacted again about taking patient back and will consult with her administrator.  CSW will f/u with [facility], other Hospice facilities, as well as Shaquenia with [employer] regarding patient on Thursday.    Sherea Liptak Givens, MSW, LCSW Licensed Clinical Social Worker [place] [phone]

## 2017-10-22 NOTE — NC FL2 (Signed)
[place] [place] LEVEL OF CARE SCREENING TOOL     IDENTIFICATION  Patient Name: [name] Birthdate: 21-Feb-1934 Sex: male Admission Date (Current Location): 10/15/2017  [facility] and [place] Number:  Kathleen Argue (CSW is working to obtain patient's Medicaid number) Facility and Address:  [facility], [place] [address] [place], [place] [address]      Provider Number: [id number]  Attending Physician Name and Address:  Elmarie Shiley, MD  Relative Name and Phone Number:  Talen Poser - [phone] and Hansel Starling - [phone]    Current Level of Care: Hospital Recommended Level of Care: Skilled Nursing Facility(Seeking LTC bed) Prior Approval Number:    Date Approved/Denied:   PASRR Number: [id number](Eff. 06/22/12)  Discharge Plan: SNF(Long-term care)    Current Diagnoses: Patient Active Problem List   Diagnosis Date Noted  . Palliative care encounter   . Comfort measures only status   . Encounter for hospice care discussion   . UTI (urinary tract infection) 10/16/2017  . Sepsis ([place]) 10/16/2017  . Sacral decubitus ulcer, stage IV ([place]) 10/16/2017  . Hyperkalemia 08/05/2017  . Type II diabetes mellitus with renal manifestations ([place]) 08/05/2017  . GERD (gastroesophageal reflux disease) 08/05/2017  . Depression 08/05/2017  . CAD (coronary artery disease) 08/05/2017  . TIA (transient ischemic attack) 08/05/2017  . Atrial flutter, chronic ([place])   . Chronic renal failure, stage 4 (severe) (HCC)   . Anger reaction 12/04/2016  . Flat affect 12/04/2016  . Iron deficiency anemia due to chronic blood loss 12/02/2016  . GI bleeding 11/27/2016  . Acute renal failure superimposed on stage 4 chronic kidney disease ([place]) 11/27/2016  . Pressure injury of skin 09/18/2016  . Leukocytosis   . Mobitz type 1 second degree AV block 09/11/2016  . Pericardial effusion- moderate on CT scan 09/11/2016  . HTN (hypertension)  09/05/2016  . Acute urinary retention 09/05/2016  . Severe obesity (BMI >= 40) ([place]) 09/05/2016  . Stroke (cerebrum) ([place]) 12/09/2015  . Bradycardia 12/08/2015  . Fall 12/08/2015  . Pancytopenia ([place]) 12/08/2015  . Atrial flutter ([place]) 08/29/2015  . Hypoxia 08/28/2015  . PAF (paroxysmal atrial fibrillation) ([place])   . Noncompliance with medications 08/03/2015  . Chronic diastolic CHF (congestive heart failure) ([place]) 07/31/2015  . Cellulitis of left lower extremity 07/31/2015  . Uncontrolled diabetes mellitus with diabetic nephropathy, with long-term current use of insulin ([place]) 07/31/2015  . Accelerated hypertension 07/31/2015  . Thrombocytopenia ([place]) 07/31/2015  . Lymphedema 06/25/2011    Orientation RESPIRATION BLADDER Height & Weight     Self  Normal External catheter Weight: 200 lb (90.7 kg) Height:  6' (182.9 cm)  BEHAVIORAL SYMPTOMS/MOOD NEUROLOGICAL BOWEL NUTRITION STATUS      Continent Diet(DYS 1)  AMBULATORY STATUS COMMUNICATION OF NEEDS Skin   Total Care(Patient has contractures of RLE and LLE and is bedbound) Verbally(Patient is minimally verbal) Other (Comment)(Stage IV pressure ulcer to sacrum; Unstageable pressure injury to right foot-treated w/foam dressing; Unstageable pressure injury to left heel; full thickness ulceration of right medial malleolus)                       Personal Care Assistance Level of Assistance  Bathing, Feeding, Dressing Bathing Assistance: Maximum assistance Feeding assistance: Maximum assistance Dressing Assistance: Maximum assistance     Functional Limitations Info  Sight, Hearing, Speech Sight Info: Adequate Hearing Info: Adequate Speech Info: Adequate    SPECIAL CARE FACTORS FREQUENCY  Speech therapy  Speech Therapy Frequency: DYS 1 (Puree) diet with thin liquids recommended([place] evaluation 2/24)      Contractures Contractures Info: Present(RLE and LLE)    Additional Factors Info  Code Status,  Allergies Code Status Info: DNR Allergies Info: Metformin and related     Isolation Precautions Info: MRSA PCR screening of nares (2/22) Negative     Current Medications (10/22/2017):  This is the current hospital active medication list Current Facility-Administered Medications  Medication Dose Route Frequency Provider Last Rate Last Dose  . 0.9 %  sodium chloride infusion  250 mL Intravenous PRN Dellinger, Bobby Rumpf, PA-C      . acetaminophen (TYLENOL) tablet 650 mg  650 mg Oral Q6H PRN Ivor Costa, MD       Or  . acetaminophen (TYLENOL) suppository 650 mg  650 mg Rectal Q6H PRN Ivor Costa, MD      . antiseptic oral rinse (BIOTENE) solution 15 mL  15 mL Topical PRN Dellinger, Haynes Dage L, PA-C      . benzonatate (TESSALON) capsule 100 mg  100 mg Oral Q6H PRN Ivor Costa, MD      . collagenase (SANTYL) ointment   Topical Daily Caren Griffins, MD      . fluticasone (FLONASE) 50 MCG/ACT nasal spray 2 spray  2 spray Each Nare Daily Ivor Costa, MD   2 spray at 10/21/17 1044  . glycopyrrolate (ROBINUL) tablet 1 mg  1 mg Oral Q4H PRN Dellinger, Haynes Dage L, PA-C       Or  . glycopyrrolate (ROBINUL) injection 0.2 mg  0.2 mg Subcutaneous Q4H PRN Dellinger, Haynes Dage L, PA-C       Or  . glycopyrrolate (ROBINUL) injection 0.2 mg  0.2 mg Intravenous Q4H PRN Dellinger, Haynes Dage L, PA-C      . haloperidol (HALDOL) tablet 0.5 mg  0.5 mg Oral Q4H PRN Dellinger, Haynes Dage L, PA-C       Or  . haloperidol (HALDOL) 2 MG/ML solution 0.5 mg  0.5 mg Sublingual Q4H PRN Dellinger, Haynes Dage L, PA-C       Or  . haloperidol lactate (HALDOL) injection 0.5 mg  0.5 mg Intravenous Q4H PRN Dellinger, Bobby Rumpf, PA-C      . LORazepam (ATIVAN) tablet 1 mg  1 mg Oral Q4H PRN Dellinger, Bobby Rumpf, PA-C       Or  . LORazepam (ATIVAN) 2 MG/ML concentrated solution 1 mg  1 mg Sublingual Q4H PRN Dellinger, Bobby Rumpf, PA-C       Or  . LORazepam (ATIVAN) injection 1 mg  1 mg Intravenous Q4H PRN Dellinger, Bobby Rumpf, PA-C       . LORazepam (ATIVAN) tablet 0.5 mg  0.5 mg Oral BID Dellinger, Marianne L, PA-C   0.5 mg at 10/22/17 1028  . mirtazapine (REMERON) tablet 7.5 mg  7.5 mg Oral QHS Ivor Costa, MD   7.5 mg at 10/19/17 2302  . morphine CONCENTRATE 10 MG/0.5ML oral solution 10 mg  10 mg Oral Q6H Dellinger, Marianne L, PA-C   10 mg at 10/22/17 1156  . morphine CONCENTRATE 10 MG/0.5ML oral solution 5 mg  5 mg Oral Q2H PRN Dellinger, Haynes Dage L, PA-C       Or  . morphine CONCENTRATE 10 MG/0.5ML oral solution 5 mg  5 mg Sublingual Q2H PRN Dellinger, Haynes Dage L, PA-C      . ondansetron (ZOFRAN) tablet 4 mg  4 mg Oral Q6H PRN Ivor Costa, MD       Or  . ondansetron [facility]) injection  4 mg  4 mg Intravenous Q6H PRN Ivor Costa, MD      . ondansetron (ZOFRAN-ODT) disintegrating tablet 4 mg  4 mg Oral Q6H PRN Dellinger, Bobby Rumpf, PA-C       Or  . ondansetron (ZOFRAN) injection 4 mg  4 mg Intravenous Q6H PRN Dellinger, Bobby Rumpf, PA-C      . polyvinyl alcohol (LIQUIFILM TEARS) 1.4 % ophthalmic solution 1 drop  1 drop Both Eyes QID PRN Dellinger, Haynes Dage L, PA-C      . risperiDONE (RISPERDAL) tablet 1 mg  1 mg Oral QHS Ivor Costa, MD   1 mg at 10/19/17 2302  . sodium chloride flush (NS) 0.9 % injection 3 mL  3 mL Intravenous Q12H Dellinger, Marianne L, PA-C   3 mL at 10/22/17 1030  . sodium chloride flush (NS) 0.9 % injection 3 mL  3 mL Intravenous PRN Dellinger, Bobby Rumpf, PA-C      . zolpidem (AMBIEN) tablet 5 mg  5 mg Oral QHS PRN Ivor Costa, MD   5 mg at 10/16/17 2227     Discharge Medications: Please see discharge summary for a list of discharge medications.  Relevant Imaging Results:  Relevant Lab Results:   Additional Information ss#[id number]. Wound infor continued: Non-pressure wound to distal right medial leg. Getting hydrotherapy during acute inpatient stay to sacral wound.  Sable Feil, LCSW

## 2017-10-22 NOTE — Progress Notes (Signed)
PROGRESS NOTE    [name]  PYP:[id number] DOB: 1933-09-23 DOA: 10/15/2017 PCP: [name], PA    Brief Narrative:  Per Dr. Randall An Jeffersonis a 84 y.o.malewith medical history significant ofsignificant ofA flutter andatrial fibrillation not on anticoagulation because of GI bleed,anemia,colon cancer, chronic diastolic CHF, CKD-IV,coronary artery disease, previous stroke(bed bound and minimally verbal),depression, s/p of indwelling foley cath,whopresents withabnormal lab. Pt is almost nonverbal, and isunable to provide accurate medical history, therefore, most of the history is obtained by discussing the case with ED physician, per EMS report, and with the nursing staff. He can say yes or no.  Per report, pt is from [place], called outdue to abnormal lab. Pt was found to haveworsening renal function and leukocytosisin facility. When I say pt in ED, noactive cough, respiratory distress noted. No active nausea, vomiting, diarrhea noted. He he moves arms is slightly, and can barely move his legs. He has stage IV decubitus sacral ulcers andulcers in both heels. He say no when asked if he has any CP or any pain anywhere.   Assessment & Plan:   Principal Problem:   UTI (urinary tract infection) Active Problems:   Chronic diastolic CHF (congestive heart failure) (HCC)   PAF (paroxysmal atrial fibrillation) (HCC)   Stroke (cerebrum) (HCC)   HTN (hypertension)   Acute renal failure superimposed on stage 4 chronic kidney disease (HCC)   Iron deficiency anemia due to chronic blood loss   Hyperkalemia   Type II diabetes mellitus with renal manifestations (HCC)   GERD (gastroesophageal reflux disease)   CAD (coronary artery disease)   Sepsis ([place])   Sacral decubitus ulcer, stage IV ([place])   Palliative care encounter   Comfort measures only status   Encounter for hospice care discussion    Hospital Course: [place] -patient was admitted to the hospital septic  with probable sources from urine vs sacral decubitus ulcers. He was started on broad spectrum antibiotics and his sepsis physiology resolved. Given bed-bound status, advanced renal failure, prior CVAs, necrotic sacral ulcer, poor QOL, minimal oral intake, palliative was consulted. After family discussions his care is being transitioned towards comfort and patient will be discharged to residential hospice.  Awaiting bed. Patient not eating. Poor oral intake.   Sepsis likely due to urinary tract infection -Patient started on ceftriaxone initially, now discontinued. Comfort care.   Chronic diastolic CHF -Seems to be compensated PAF and a flutter -Not a good candidate for anti-cognition due to history of GI bleed Hypertension Acute kidney injury on chronic kidney disease stage IV  Prior CVAs -Patient minimally verbal, essentially bedbound Stage IV sacral decubitus ulcer -wound appears necrotic. General surgery was consulted however patient not a surgical candidate.  Anemia in the setting of chronic kidney disease        DVT prophylaxis: heparin  Code Status; DNR Family Communication: no family at bedside.  Disposition Plan: awaiting residential hospice bed.    Consultants:   Palliative    Procedures:   none   Antimicrobials:   none   Subjective: Asking for water, he is very weak.   Objective: Vitals:   10/20/17 0907 10/20/17 1635 10/21/17 0910 10/22/17 0443  BP: (!) 132/48 (!) 128/52 (!) 147/60 (!) 128/45  Pulse: 64 66 69 61  Resp: 16 16 18 18   Temp: 98.3 F (36.8 C)  98.6 F (37 C) 98.8 F (37.1 C)  TempSrc: Oral  Oral Oral  SpO2: 98% 99% 98% 100%  Weight:      Height:  Intake/Output Summary (Last 24 hours) at 10/22/2017 1519 Last data filed at 10/22/2017 0600 Gross per 24 hour  Intake 0 ml  Output 0 ml  Net 0 ml   Filed Weights   10/15/17 2220  Weight: 90.7 kg (200 lb)    Examination:  General exam: appears comfortable.  Respiratory  system: normal respiratory effort, CTA Cardiovascular system: S 1, S 2 RRR Gastrointestinal system; BS present, soft, nt Central nervous system: alert, weak.  Extremities: trace edema    Data Reviewed: I have personally reviewed following labs and imaging studies  CBC: Recent Labs  Lab 10/16/17 0026 10/16/17 0430 10/17/17 0416 10/19/17 0922  WBC 22.3* 21.0* 13.4* 11.7*  NEUTROABS 19.8*  --   --   --   HGB 9.5* 9.5* 8.8* 8.8*  HCT 28.6* 30.5* 27.9* 27.1*  MCV 87.5 88.2 88.3 87.7  PLT 241 178 167 462*   Basic Metabolic Panel: Recent Labs  Lab 10/16/17 0026 10/16/17 0430 10/17/17 0416 10/18/17 1430 10/19/17 0922  NA 135 136 140 143 141  K 5.8* 4.6 3.4* 2.5* 3.9  CL 109 107 112* 111 119*  CO2 9* 11* 11* 11* 12*  GLUCOSE 112* 96 86 84 68  BUN 94* 93* 82* 77* 75*  CREATININE 4.61* 4.57* 4.11* 3.71* 3.59*  CALCIUM 8.1* 8.0* 7.9* 6.8* 7.3*  MG  --   --   --   --  1.2*   GFR: Estimated Creatinine Clearance: 17.7 mL/min (A) (by C-G formula based on SCr of 3.59 mg/dL (H)). Liver Function Tests: Recent Labs  Lab 10/16/17 0026  AST 12*  ALT <5*  ALKPHOS 110  BILITOT 1.5*  PROT 5.8*  ALBUMIN 2.5*   No results for input(s): LIPASE, AMYLASE in the last 168 hours. No results for input(s): AMMONIA in the last 168 hours. Coagulation Profile: No results for input(s): INR, PROTIME in the last 168 hours. Cardiac Enzymes: No results for input(s): CKTOTAL, CKMB, CKMBINDEX, TROPONINI in the last 168 hours. BNP (last 3 results) No results for input(s): PROBNP in the last 8760 hours. HbA1C: No results for input(s): HGBA1C in the last 72 hours. CBG: Recent Labs  Lab 10/19/17 1115 10/19/17 1637 10/19/17 2137 10/20/17 0742 10/20/17 1217  GLUCAP 76 94 84 98 96   Lipid Profile: No results for input(s): CHOL, HDL, LDLCALC, TRIG, CHOLHDL, LDLDIRECT in the last 72 hours. Thyroid Function Tests: No results for input(s): TSH, T4TOTAL, FREET4, T3FREE, THYROIDAB in the last 72  hours. Anemia Panel: No results for input(s): VITAMINB12, FOLATE, FERRITIN, TIBC, IRON, RETICCTPCT in the last 72 hours. Sepsis Labs: Recent Labs  Lab 10/16/17 0430  PROCALCITON 5.29  LATICACIDVEN 1.1    Recent Results (from the past 240 hour(s))  Culture, blood (x 2)     Status: None   Collection Time: 10/16/17  4:30 AM  Result Value Ref Range Status   Specimen Description BLOOD LEFT ARM  Final   Special Requests   Final    Blood Culture adequate volume BOTTLES DRAWN AEROBIC AND ANAEROBIC   Culture   Final    NO GROWTH 5 DAYS Performed at [facility], [address] [place], [place] [address]    Report Status 10/21/2017 FINAL  Final  Culture, blood (x 2)     Status: None   Collection Time: 10/16/17  4:30 AM  Result Value Ref Range Status   Specimen Description BLOOD LEFT ARM  Final   Special Requests Blood Culture adequate volume IN PEDIATRIC BOTTLE  Final  Culture   Final    NO GROWTH 5 DAYS Performed at [facility], [place] [address], [place], [place] [address]    Report Status 10/21/2017 FINAL  Final  MRSA PCR Screening     Status: None   Collection Time: 10/17/17  7:36 AM  Result Value Ref Range Status   MRSA by PCR NEGATIVE NEGATIVE Final    Comment:        The GeneXpert MRSA Assay (FDA approved for NASAL specimens only), is one component of a comprehensive MRSA colonization surveillance program. It is not intended to diagnose MRSA infection nor to guide or monitor treatment for MRSA infections. Performed at [facility], [place] [address] [place], [place] [address]          Radiology Studies: No results found.      Scheduled Meds: . collagenase   Topical Daily  . fluticasone  2 spray Each Nare Daily  . LORazepam  0.5 mg Oral BID  . mirtazapine  7.5 mg Oral QHS  . morphine CONCENTRATE  10 mg Oral Q6H  . risperiDONE  1 mg Oral QHS  . sodium chloride flush  3 mL Intravenous Q12H   Continuous Infusions: . sodium chloride        LOS: 6 days    Time spent: 35 minutes.     Elmarie Shiley, MD [facility] Pager [phone]  If 7PM-7AM, please contact night-coverage [email or web address] Password [facility] 10/22/2017, 3:19 PM

## 2017-10-23 NOTE — Progress Notes (Signed)
Palliative Medicine RN Note: Patient is resting with eyes closed. Deep breaths, no groaning or grimacing.   PMT will continue to follow for symptoms.  Marjie Skiff Hildy Nicholl, RN, BSN, [facility] Palliative Medicine Team 10/23/2017 2:47 PM Office [phone]

## 2017-10-23 NOTE — Progress Notes (Signed)
[facility] Liaison:  RN  Received request from [place], [place], for family interst in [place].  Chart reviewed.  Unfortunately, [facility] is not able to offer a room today.  Lorriane Shire, CSW, is aware that [facility] liaison will follow up with CSW and family tomorrow or sooner if room becomes available.  Please do not hesitate to call with questions.   Thank you for this referral.  Edyth Gunnels, RN, [facility] Liaison [phone]  All hospital liaisons are now on [place].

## 2017-10-23 NOTE — Clinical Social Work Note (Signed)
CSW continuing to work on placement for patient. [facility] feels the patient is appropriate, but does not have any beds today, and will follow-up with CSW on Friday re: bed availability. Daughter agreeable to [facility] on Friday if no hospice availability. Doctor provided with update.  [name], MSW, LCSW Licensed Clinical Social Worker [place] [phone]

## 2017-10-23 NOTE — Progress Notes (Signed)
PROGRESS NOTE    [name]  KYH:[id number] DOB: 1934/07/27 DOA: 10/15/2017 PCP: [name], PA    Brief Narrative:  Per Dr. Randall An Jeffersonis a 84 y.o.malewith medical history significant ofsignificant ofA flutter andatrial fibrillation not on anticoagulation because of GI bleed,anemia,colon cancer, chronic diastolic CHF, CKD-IV,coronary artery disease, previous stroke(bed bound and minimally verbal),depression, s/p of indwelling foley cath,whopresents withabnormal lab. Pt is almost nonverbal, and isunable to provide accurate medical history, therefore, most of the history is obtained by discussing the case with ED physician, per EMS report, and with the nursing staff. He can say yes or no.  Per report, pt is from [place], called outdue to abnormal lab. Pt was found to haveworsening renal function and leukocytosisin facility. When I say pt in ED, noactive cough, respiratory distress noted. No active nausea, vomiting, diarrhea noted. He he moves arms is slightly, and can barely move his legs. He has stage IV decubitus sacral ulcers andulcers in both heels. He say no when asked if he has any CP or any pain anywhere.   Assessment & Plan:   Principal Problem:   UTI (urinary tract infection) Active Problems:   Chronic diastolic CHF (congestive heart failure) (HCC)   PAF (paroxysmal atrial fibrillation) (HCC)   Stroke (cerebrum) (HCC)   HTN (hypertension)   Acute renal failure superimposed on stage 4 chronic kidney disease (HCC)   Iron deficiency anemia due to chronic blood loss   Hyperkalemia   Type II diabetes mellitus with renal manifestations (HCC)   GERD (gastroesophageal reflux disease)   CAD (coronary artery disease)   Sepsis ([place])   Sacral decubitus ulcer, stage IV ([place])   Palliative care encounter   Comfort measures only status   Encounter for hospice care discussion    Hospital Course: [place] -patient was admitted to the hospital septic  with probable sources from urine vs sacral decubitus ulcers. He was started on broad spectrum antibiotics and his sepsis physiology resolved. Given bed-bound status, advanced renal failure, prior CVAs, necrotic sacral ulcer, poor QOL, minimal oral intake, palliative was consulted. After family discussions his care is being transitioned towards comfort and patient will be discharged to residential hospice.  Awaiting bed. Patient not eating. Poor oral intake. Benefit of transfer to residential hospice for ends of life care.   Sepsis likely due to urinary tract infection -Patient started on ceftriaxone initially, now discontinued. Comfort care.   Chronic diastolic CHF -Seems to be compensated PAF and a flutter -Not a good candidate for anti-cognition due to history of GI bleed Hypertension Acute kidney injury on chronic kidney disease stage IV  Prior CVAs -Patient minimally verbal, essentially bedbound Stage IV sacral decubitus ulcer -wound appears necrotic. General surgery was consulted however patient not a surgical candidate.  Anemia in the setting of chronic kidney disease        DVT prophylaxis: heparin  Code Status; DNR Family Communication: no family at bedside.  Disposition Plan: awaiting residential hospice bed.    Consultants:   Palliative    Procedures:   none   Antimicrobials:   none   Subjective: Patient is lethargic, open eyes transiently, non verbal.  Per staff patient not eating, food has been offerd.   Objective: Vitals:   10/20/17 1635 10/21/17 0910 10/22/17 0443 10/23/17 1010  BP: (!) 128/52 (!) 147/60 (!) 128/45 (!) 114/38  Pulse: 66 69 61   Resp: 16 18 18 17   Temp:  98.6 F (37 C) 98.8 F (37.1 C) (!) 97.3 F (  36.3 C)  TempSrc:  Oral Oral Axillary  SpO2: 99% 98% 100% 100%  Weight:      Height:        Intake/Output Summary (Last 24 hours) at 10/23/2017 1512 Last data filed at 10/23/2017 1400 Gross per 24 hour  Intake 0 ml  Output 350  ml  Net -350 ml   Filed Weights   10/15/17 2220  Weight: 90.7 kg (200 lb)    Examination:  General exam: comfortable, no distress.  Respiratory system: shallow breathing, crackles bases.  Cardiovascular system: S 1, S 2 RRR Gastrointestinal system;Bs present, soft, nt Central nervous system: lethargic Extremities:  Trace edema.     Data Reviewed: I have personally reviewed following labs and imaging studies  CBC: Recent Labs  Lab 10/17/17 0416 10/19/17 0922  WBC 13.4* 11.7*  HGB 8.8* 8.8*  HCT 27.9* 27.1*  MCV 88.3 87.7  PLT 167 706*   Basic Metabolic Panel: Recent Labs  Lab 10/17/17 0416 10/18/17 1430 10/19/17 0922  NA 140 143 141  K 3.4* 2.5* 3.9  CL 112* 111 119*  CO2 11* 11* 12*  GLUCOSE 86 84 68  BUN 82* 77* 75*  CREATININE 4.11* 3.71* 3.59*  CALCIUM 7.9* 6.8* 7.3*  MG  --   --  1.2*   GFR: Estimated Creatinine Clearance: 17.7 mL/min (A) (by C-G formula based on SCr of 3.59 mg/dL (H)). Liver Function Tests: No results for input(s): AST, ALT, ALKPHOS, BILITOT, PROT, ALBUMIN in the last 168 hours. No results for input(s): LIPASE, AMYLASE in the last 168 hours. No results for input(s): AMMONIA in the last 168 hours. Coagulation Profile: No results for input(s): INR, PROTIME in the last 168 hours. Cardiac Enzymes: No results for input(s): CKTOTAL, CKMB, CKMBINDEX, TROPONINI in the last 168 hours. BNP (last 3 results) No results for input(s): PROBNP in the last 8760 hours. HbA1C: No results for input(s): HGBA1C in the last 72 hours. CBG: Recent Labs  Lab 10/19/17 1115 10/19/17 1637 10/19/17 2137 10/20/17 0742 10/20/17 1217  GLUCAP 76 94 84 98 96   Lipid Profile: No results for input(s): CHOL, HDL, LDLCALC, TRIG, CHOLHDL, LDLDIRECT in the last 72 hours. Thyroid Function Tests: No results for input(s): TSH, T4TOTAL, FREET4, T3FREE, THYROIDAB in the last 72 hours. Anemia Panel: No results for input(s): VITAMINB12, FOLATE, FERRITIN, TIBC, IRON,  RETICCTPCT in the last 72 hours. Sepsis Labs: No results for input(s): PROCALCITON, LATICACIDVEN in the last 168 hours.  Recent Results (from the past 240 hour(s))  Culture, blood (x 2)     Status: None   Collection Time: 10/16/17  4:30 AM  Result Value Ref Range Status   Specimen Description BLOOD LEFT ARM  Final   Special Requests   Final    Blood Culture adequate volume BOTTLES DRAWN AEROBIC AND ANAEROBIC   Culture   Final    NO GROWTH 5 DAYS Performed at [facility], [address], [place], [place] [address]    Report Status 10/21/2017 FINAL  Final  Culture, blood (x 2)     Status: None   Collection Time: 10/16/17  4:30 AM  Result Value Ref Range Status   Specimen Description BLOOD LEFT ARM  Final   Special Requests Blood Culture adequate volume IN PEDIATRIC BOTTLE  Final   Culture   Final    NO GROWTH 5 DAYS Performed at [facility], [place] [address], [place], [place] [address]    Report Status 10/21/2017 FINAL  Final  MRSA PCR Screening     Status: None   Collection Time: 10/17/17  7:36 AM  Result Value Ref Range Status   MRSA by PCR NEGATIVE NEGATIVE Final    Comment:        The GeneXpert MRSA Assay (FDA approved for NASAL specimens only), is one component of a comprehensive MRSA colonization surveillance program. It is not intended to diagnose MRSA infection nor to guide or monitor treatment for MRSA infections. Performed at [facility], [place] [address], [place], [place] [address]          Radiology Studies: No results found.      Scheduled Meds: . collagenase   Topical Daily  . fluticasone  2 spray Each Nare Daily  . LORazepam  0.5 mg Intravenous BID  .  morphine injection  2 mg Intravenous Q6H  . sodium chloride flush  3 mL Intravenous Q12H   Continuous Infusions: . sodium chloride       LOS: 7 days    Time spent: 35 minutes.     Elmarie Shiley, MD [facility] Pager [phone]  If 7PM-7AM, please  contact night-coverage [email or web address] Password [facility] 10/23/2017, 3:12 PM

## 2017-10-23 NOTE — Progress Notes (Signed)
Responded to [facility] consult to provide spiritual support and support patient. Patient was not alert. Spoke with patient's nurse to let patient know I visited. Chaplain available as needed. Redgie Grayer

## 2017-10-24 MED ORDER — HALOPERIDOL LACTATE 2 MG/ML PO CONC: 1.0000 mg | ORAL | 0 refills | Status: AC | PRN

## 2017-10-24 MED ORDER — MORPHINE SULFATE (PF) 2 MG/ML IV SOLN: 2.0000 mg | Freq: Four times a day (QID) | INTRAVENOUS | 0 refills | Status: AC

## 2017-10-24 MED ORDER — LORAZEPAM 2 MG/ML PO CONC: 1.0000 mg | ORAL | 0 refills | Status: AC | PRN

## 2017-10-24 NOTE — Progress Notes (Signed)
PROGRESS NOTE    [name]  NTI:[id number] DOB: [birth year] DOA: 10/15/2017 PCP: [name], PA    Brief Narrative:  Per Dr. Randall An Jeffersonis a 84 y.o.malewith medical history significant ofsignificant ofA flutter andatrial fibrillation not on anticoagulation because of GI bleed,anemia,colon cancer, chronic diastolic CHF, CKD-IV,coronary artery disease, previous stroke(bed bound and minimally verbal),depression, s/p of indwelling foley cath,whopresents withabnormal lab. Pt is almost nonverbal, and isunable to provide accurate medical history, therefore, most of the history is obtained by discussing the case with ED physician, per EMS report, and with the nursing staff. He can say yes or no.  Per report, pt is from [place], called outdue to abnormal lab. Pt was found to haveworsening renal function and leukocytosisin facility. When I say pt in ED, noactive cough, respiratory distress noted. No active nausea, vomiting, diarrhea noted. He he moves arms is slightly, and can barely move his legs. He has stage IV decubitus sacral ulcers andulcers in both heels. He say no when asked if he has any CP or any pain anywhere.   Assessment & Plan:   Principal Problem:   UTI (urinary tract infection) Active Problems:   Chronic diastolic CHF (congestive heart failure) (HCC)   PAF (paroxysmal atrial fibrillation) (HCC)   Stroke (cerebrum) (HCC)   HTN (hypertension)   Acute renal failure superimposed on stage 4 chronic kidney disease (HCC)   Iron deficiency anemia due to chronic blood loss   Hyperkalemia   Type II diabetes mellitus with renal manifestations (HCC)   GERD (gastroesophageal reflux disease)   CAD (coronary artery disease)   Sepsis ([place])   Sacral decubitus ulcer, stage IV ([place])   Palliative care encounter   Comfort measures only status   Encounter for hospice care discussion    Hospital Course: [place] -patient was admitted to the hospital septic  with probable sources from urine vs sacral decubitus ulcers. He was started on broad spectrum antibiotics and his sepsis physiology resolved. Given bed-bound status, advanced renal failure, prior CVAs, necrotic sacral ulcer, poor QOL, minimal oral intake, palliative was consulted. After family discussions his care is being transitioned towards comfort and patient will be discharged to residential hospice.  Awaiting bed. Patient not eating. Poor oral intake. Benefit of transfer to residential hospice for ends of life care.  Patient was started on IV schedule morphine by palliative care team. He fail oral morphine per palliative care team.  Needs to be transfer to Residential Hospice facility.   Sepsis likely due to urinary tract infection -Patient started on ceftriaxone initially, now discontinued. Comfort care.   Chronic diastolic CHF -Seems to be compensated PAF and a flutter -Not a good candidate for anti-cognition due to history of GI bleed Hypertension Acute kidney injury on chronic kidney disease stage IV  Prior CVAs -Patient minimally verbal, essentially bedbound Stage IV sacral decubitus ulcer -wound appears necrotic. General surgery was consulted however patient not a surgical candidate. On IV morphine for pain controlled.  Anemia in the setting of chronic kidney disease        DVT prophylaxis: heparin  Code Status; DNR Family Communication: no family at bedside.  Disposition Plan: awaiting residential hospice bed.    Consultants:   Palliative    Procedures:   none   Antimicrobials:   none   Subjective: Lethargic, open eyes at times. Does not appears in pain.   Objective: Vitals:   10/21/17 0910 10/22/17 0443 10/23/17 1010 10/24/17 0510  BP: (!) 147/60 (!) 128/45 (!) 114/38 123/78  Pulse: 69 61    Resp: 18 18 17 16   Temp: 98.6 F (37 C) 98.8 F (37.1 C) (!) 97.3 F (36.3 C) 98.1 F (36.7 C)  TempSrc: Oral Oral Oral Oral  SpO2: 98% 100% 100% 98%    Weight:      Height:        Intake/Output Summary (Last 24 hours) at 10/24/2017 1458 Last data filed at 10/24/2017 1030 Gross per 24 hour  Intake 0 ml  Output 0 ml  Net 0 ml   Filed Weights   10/15/17 2220  Weight: 90.7 kg (200 lb)    Examination:  General exam:  No acute distress. Lethargic, open eyes at time  Respiratory system: shallow breathing.  Cardiovascular system: S 1, S 2 RRR Gastrointestinal system; BS present, soft, nt Central nervous system: Lethargic.  Extremities:  Trace edema.     Data Reviewed: I have personally reviewed following labs and imaging studies  CBC: Recent Labs  Lab 10/19/17 0922  WBC 11.7*  HGB 8.8*  HCT 27.1*  MCV 87.7  PLT 025*   Basic Metabolic Panel: Recent Labs  Lab 10/18/17 1430 10/19/17 0922  NA 143 141  K 2.5* 3.9  CL 111 119*  CO2 11* 12*  GLUCOSE 84 68  BUN 77* 75*  CREATININE 3.71* 3.59*  CALCIUM 6.8* 7.3*  MG  --  1.2*   GFR: Estimated Creatinine Clearance: 17.7 mL/min (A) (by C-G formula based on SCr of 3.59 mg/dL (H)). Liver Function Tests: No results for input(s): AST, ALT, ALKPHOS, BILITOT, PROT, ALBUMIN in the last 168 hours. No results for input(s): LIPASE, AMYLASE in the last 168 hours. No results for input(s): AMMONIA in the last 168 hours. Coagulation Profile: No results for input(s): INR, PROTIME in the last 168 hours. Cardiac Enzymes: No results for input(s): CKTOTAL, CKMB, CKMBINDEX, TROPONINI in the last 168 hours. BNP (last 3 results) No results for input(s): PROBNP in the last 8760 hours. HbA1C: No results for input(s): HGBA1C in the last 72 hours. CBG: Recent Labs  Lab 10/19/17 1115 10/19/17 1637 10/19/17 2137 10/20/17 0742 10/20/17 1217  GLUCAP 76 94 84 98 96   Lipid Profile: No results for input(s): CHOL, HDL, LDLCALC, TRIG, CHOLHDL, LDLDIRECT in the last 72 hours. Thyroid Function Tests: No results for input(s): TSH, T4TOTAL, FREET4, T3FREE, THYROIDAB in the last 72  hours. Anemia Panel: No results for input(s): VITAMINB12, FOLATE, FERRITIN, TIBC, IRON, RETICCTPCT in the last 72 hours. Sepsis Labs: No results for input(s): PROCALCITON, LATICACIDVEN in the last 168 hours.  Recent Results (from the past 240 hour(s))  Culture, blood (x 2)     Status: None   Collection Time: 10/16/17  4:30 AM  Result Value Ref Range Status   Specimen Description BLOOD LEFT ARM  Final   Special Requests   Final    Blood Culture adequate volume BOTTLES DRAWN AEROBIC AND ANAEROBIC   Culture   Final    NO GROWTH 5 DAYS Performed at [facility], [address], [place], [place] [address]    Report Status 10/21/2017 FINAL  Final  Culture, blood (x 2)     Status: None   Collection Time: 10/16/17  4:30 AM  Result Value Ref Range Status   Specimen Description BLOOD LEFT ARM  Final   Special Requests Blood Culture adequate volume IN PEDIATRIC BOTTLE  Final   Culture   Final    NO GROWTH 5 DAYS Performed at [facility], [place]  [address], [place], [place] [address]    Report Status 10/21/2017 FINAL  Final  MRSA PCR Screening     Status: None   Collection Time: 10/17/17  7:36 AM  Result Value Ref Range Status   MRSA by PCR NEGATIVE NEGATIVE Final    Comment:        The GeneXpert MRSA Assay (FDA approved for NASAL specimens only), is one component of a comprehensive MRSA colonization surveillance program. It is not intended to diagnose MRSA infection nor to guide or monitor treatment for MRSA infections. Performed at [facility], [place] [address] [place], [place] [address]          Radiology Studies: No results found.      Scheduled Meds: . collagenase   Topical Daily  . fluticasone  2 spray Each Nare Daily  . LORazepam  0.5 mg Intravenous BID  .  morphine injection  2 mg Intravenous Q6H  . sodium chloride flush  3 mL Intravenous Q12H   Continuous Infusions: . sodium chloride       LOS: 8 days    Time spent: 35 minutes.      Elmarie Shiley, MD [facility] Pager [phone]  If 7PM-7AM, please contact night-coverage [email or web address] Password [facility] 10/24/2017, 2:58 PM

## 2017-10-24 NOTE — Progress Notes (Signed)
Palliative Care Daily Progress Note   NO CHARGE   Medical record reviewed and patient assessment complete. Patient awakes to sternal rub, he remains lethargic and nonverbal. No signs or symptoms of pain during visit. No changes to medications at this time. Medications are onboard for comfort.   Patient continues to await available bed at [facility] facility. This remains appropriate given his poor PO intake (min sips) and the need for required scheduled IV Morphine and IV ativan. Daughter is aware of current plans and remains in agreement.   Alda Lea, AGNP-C Palliative Medicine Team  Phone: [phone] Fax: [phone]

## 2017-10-24 NOTE — Clinical Social Work Note (Addendum)
Patient will discharge to [facility] today. Daughter Efton Thomley has given permission for patient's friend Sharlyn Bologna to sign consents so that patient can d/c today to hospice facility. Discharge summary transmitted to facility and daughter and Mr. Tessie Fass will go to Hospice facility on Saturday to complete the rest of the paperwork. Patient will be transported by ambulance.  CSW later informed by Clenton Pare with [facility] that patient will need to discharge on Saturday. Two needed consent forms signed by Mr. Hood and transmitted back to hospice facility. Mr. Tessie Fass [phone]) and Ms. Mcmath [phone]) contacted and updated. They will go to the Hospice facility on Saturday at 11 am to sign the rest of patient's admission paperwork. MD contacted and notified of change in discharge date.  The number for report to Hospice facility is [phone] and patient will be going to room 6. Patient's nurse today will also be his nurse tomorrow and was given this information. Patient will be transported by ambulance to facility.  Timothy Townsel Givens, MSW, LCSW Licensed Clinical Social Worker Clinical Social Work Department [employer] [phone]  Rhoderick Farrel Givens, MSW, [place] Licensed Clinical Social Worker [facility] [phone]

## 2017-10-24 NOTE — Progress Notes (Signed)
[facility] Liaison:  RN  Received request from [place], [place], for family interest in [facility].  Chart reviewed.  Unfortunately, [facility] is unable to offer a room today.  Tami, daughter, and Lorriane Shire, CSW are aware that [facility] liaison will follow up with CSW and family tomorrow or sooner if room becomes available.  Please do not hesitate to call with questions.   Thank you for this referral,  Amy Evans. RN, [facility] Liaison [phone]  All hospital liaisons are now on [place].

## 2017-10-24 NOTE — Progress Notes (Addendum)
Palliative Medicine RN Note: Daily symptom check. Patient in bed, eyes closed, PAINAD 0. No changes to medications needed right now. We will follow daily to ensure patient remains comfortable.  Discussed pt with PMT providers. Patient failed therapy with PO medications and requires scheduled IV morphine and IV ativan. IV medications are not routinely given at nursing facilities, so PMT can only support a transfer to inpatient hospice.   Marjie Skiff Avy [place], RN, BSN, [facility] Palliative Medicine Team 10/24/2017 11:31 AM Office [phone]

## 2017-10-24 NOTE — Discharge Summary (Addendum)
Physician Discharge Summary  [name] ZOX:[id number] DOB: July 09, 1934 DOA: 10/15/2017  PCP: [name], PA  Admit date: 10/15/2017 Discharge date: 10/24/2017  Admitted From: SNF Disposition:  Residential Hospice.   Recommendations for Outpatient Follow-up:  Comfort care, transfer to residential hospice.     Discharge Condition: Guarded CODE STATUS: DNR, Comfort care.  Diet recommendation: comfort feeding.   Brief/Interim Summary: Brief Narrative:  Per Dr.Niu [name] a 85 y.o.malewith medical history significant ofsignificant ofA flutter andatrial fibrillation not on anticoagulation because of GI bleed,anemia,colon cancer, chronic diastolic CHF, CKD-IV,coronary artery disease, previous stroke(bed bound and minimally verbal),depression, s/p of indwelling foley cath,whopresents withabnormal lab. Pt is almost nonverbal, and isunable to provide accurate medical history, therefore, most of the history is obtained by discussing the case with ED physician, per EMS report, and with the nursing staff. He can say yes or no. Per report, pt is from [place], called outdue to abnormal lab. Pt was found to haveworsening renal function and leukocytosisin facility. When I say pt in ED, noactive cough, respiratory distress noted. No active nausea, vomiting, diarrhea noted. He he moves arms is slightly, and can barely move his legs. He has stage IV decubitus sacral ulcers andulcers in both heels. He say no when asked if he has any CP or any pain anywhere.   Assessment & Plan:   Principal Problem:   UTI (urinary tract infection) Active Problems:   Chronic diastolic CHF (congestive heart failure) (HCC)   PAF (paroxysmal atrial fibrillation) (HCC)   Stroke (cerebrum) (HCC)   HTN (hypertension)   Acute renal failure superimposed on stage 4 chronic kidney disease (HCC)   Iron deficiency anemia due to chronic blood loss   Hyperkalemia   Type II diabetes  mellitus with renal manifestations (HCC)   GERD (gastroesophageal reflux disease)   CAD (coronary artery disease)   Sepsis ([place])   Sacral decubitus ulcer, stage IV ([place])   Palliative care encounter   Comfort measures only status   Encounter for hospice care discussion    Hospital Course: [place] -patient was admitted to the hospital septic with probable sources from urine vs sacral decubitus ulcers. He was started on broad spectrum antibiotics and his sepsis physiology resolved. Given bed-bound status, advanced renal failure, prior CVAs, necrotic sacral ulcer, poor QOL, minimal oral intake, palliative was consulted. After family discussions his care is being transitioned towards comfort and patient will be discharged to residential hospice. Awaiting bed. Patient not eating. Poor oral intake. Benefit of transfer to residential hospice for ends of life care.  Patient was started on IV schedule morphine by palliative care team. He fail oral morphine per palliative care team.  Needs to be transfer to Residential Hospice facility. patient has bed at residential hospice facility .    Sepsis likely due to urinary tract infection -Patient started on ceftriaxoneinitially, now discontinued. Comfort care.   Chronic diastolic CHF -Seems to be compensated PAF and a flutter -Not a good candidate for anti-cognition due to history of GI bleed Hypertension Acute kidney injury on chronic kidney disease stage IV  Prior CVAs -Patient minimally verbal, essentially bedbound Stage IV sacral decubitus ulcer -wound appears necrotic. General surgery was consulted however patient not a surgical candidate.On IV morphine for pain controlled.  Anemia in the setting of chronic kidney disease         Discharge Diagnoses:  Principal Problem:   UTI (urinary tract infection) Active Problems:   Chronic diastolic CHF (congestive heart failure) (HCC)   PAF (paroxysmal  atrial fibrillation) (HCC)    Stroke (cerebrum) (HCC)   HTN (hypertension)   Acute renal failure superimposed on stage 4 chronic kidney disease (HCC)   Iron deficiency anemia due to chronic blood loss   Hyperkalemia   Type II diabetes mellitus with renal manifestations (HCC)   GERD (gastroesophageal reflux disease)   CAD (coronary artery disease)   Sepsis ([place])   Sacral decubitus ulcer, stage IV (HCC)   Palliative care encounter   Comfort measures only status   Encounter for hospice care discussion    Discharge Instructions  Discharge Instructions    Diet - low sodium heart healthy   Complete by:  As directed    Increase activity slowly   Complete by:  As directed      Allergies as of 10/24/2017      Reactions   Metformin And Related Other (See Comments)   Reaction: pt states he thinks he threw up      Medication List    STOP taking these medications   amLODipine 10 MG tablet Commonly known as:  NORVASC   aspirin 325 MG EC tablet   atorvastatin 20 MG tablet Commonly known as:  LIPITOR   b complex-vitamin c-folic acid 0.8 MG Tabs tablet   benzonatate 100 MG capsule Commonly known as:  TESSALON   cloNIDine 0.1 mg/24hr patch Commonly known as:  CATAPRES - Dosed in mg/24 hr   ferrous sulfate 325 (65 FE) MG EC tablet   fluticasone 50 MCG/ACT nasal spray Commonly known as:  FLONASE   GLUCAGON EMERGENCY 1 MG injection Generic drug:  glucagon   hydrALAZINE 25 MG tablet Commonly known as:  APRESOLINE   HYDROcodone-acetaminophen 5-325 MG tablet Commonly known as:  NORCO/VICODIN   LANTUS SOLOSTAR 100 UNIT/ML Solostar Pen Generic drug:  Insulin Glargine   mirtazapine 7.5 MG tablet Commonly known as:  REMERON   pantoprazole 40 MG tablet Commonly known as:  PROTONIX   risperiDONE 1 MG/ML oral solution Commonly known as:  RISPERDAL   UNABLE TO FIND     TAKE these medications   acetaminophen 325 MG tablet Commonly known as:  TYLENOL Take 650 mg by mouth every 6 (six) hours.    haloperidol 2 MG/ML solution Commonly known as:  HALDOL Place 0.5 mLs (1 mg total) under the tongue every 4 (four) hours as needed for agitation (or delirium).   LORazepam 2 MG/ML concentrated solution Commonly known as:  ATIVAN Place 0.5 mLs (1 mg total) under the tongue every 4 (four) hours as needed for anxiety.   morphine 2 MG/ML injection Inject 1 mL (2 mg total) into the vein every 6 (six) hours.   senna 8.6 MG Tabs tablet Commonly known as:  SENOKOT Take 2 tablets by mouth every 8 (eight) hours.       Allergies  Allergen Reactions  . Metformin And Related Other (See Comments)    Reaction: pt states he thinks he threw up    Consultations:  Palliative    Procedures/Studies: Dg Chest Portable 1 View  Result Date: 10/15/2017 CLINICAL DATA:  Elevated white blood cell count. EXAM: PORTABLE CHEST 1 VIEW COMPARISON:  Radiograph 07/06/2017.  CT 09/10/2016 FINDINGS: Patient rotated to the right. Similar cardiomegaly to prior exam. Increased retrocardiac opacity and left pleural effusion. Right paratracheal density likely confluent vessels as seen on prior CT. No pulmonary edema. No pneumothorax. IMPRESSION: Left pleural effusion with increased retrocardiac opacity from prior exam, may be compressive atelectasis or pneumonia in the setting of elevated white  blood cell count. Cardiomegaly is similar. Electronically Signed   By: Jeb Levering M.D.   On: 10/15/2017 22:51     Subjective: Appears comfortable  Discharge Exam: Vitals:   10/23/17 1010 10/24/17 0510  BP: (!) 114/38 123/78  Pulse:    Resp: 17 16  Temp: (!) 97.3 F (36.3 C) 98.1 F (36.7 C)  SpO2: 100% 98%   Vitals:   10/21/17 0910 10/22/17 0443 10/23/17 1010 10/24/17 0510  BP: (!) 147/60 (!) 128/45 (!) 114/38 123/78  Pulse: 69 61    Resp: 18 18 17 16   Temp: 98.6 F (37 C) 98.8 F (37.1 C) (!) 97.3 F (36.3 C) 98.1 F (36.7 C)  TempSrc: Oral Oral Oral Oral  SpO2: 98% 100% 100% 98%  Weight:       Height:        General: lethargic, in no distress.  Cardiovascular: RRR, S1/S2 +, Respiratory: decreased breath sounds.  Abdominal: Soft, NT, ND, bowel sounds + Extremities: no edema, no cyanosis    The results of significant diagnostics from this hospitalization (including imaging, microbiology, ancillary and laboratory) are listed below for reference.     Microbiology: Recent Results (from the past 240 hour(s))  Culture, blood (x 2)     Status: None   Collection Time: 10/16/17  4:30 AM  Result Value Ref Range Status   Specimen Description BLOOD LEFT ARM  Final   Special Requests   Final    Blood Culture adequate volume BOTTLES DRAWN AEROBIC AND ANAEROBIC   Culture   Final    NO GROWTH 5 DAYS Performed at [facility], [address] [place], [place] [address]    Report Status 10/21/2017 FINAL  Final  Culture, blood (x 2)     Status: None   Collection Time: 10/16/17  4:30 AM  Result Value Ref Range Status   Specimen Description BLOOD LEFT ARM  Final   Special Requests Blood Culture adequate volume IN PEDIATRIC BOTTLE  Final   Culture   Final    NO GROWTH 5 DAYS Performed at [facility], [place] [address], [place], [place] [address]    Report Status 10/21/2017 FINAL  Final  MRSA PCR Screening     Status: None   Collection Time: 10/17/17  7:36 AM  Result Value Ref Range Status   MRSA by PCR NEGATIVE NEGATIVE Final    Comment:        The GeneXpert MRSA Assay (FDA approved for NASAL specimens only), is one component of a comprehensive MRSA colonization surveillance program. It is not intended to diagnose MRSA infection nor to guide or monitor treatment for MRSA infections. Performed at [facility], [place] [address], [place], [place] [address]      Labs: BNP (last 3 results) Recent Labs    11/27/16 1736 08/05/17 0409 10/16/17 0430  BNP 361.4* 179.2* 195.0*   Basic Metabolic Panel: Recent Labs  Lab 10/18/17 1430 10/19/17 0922  NA 143  141  K 2.5* 3.9  CL 111 119*  CO2 11* 12*  GLUCOSE 84 68  BUN 77* 75*  CREATININE 3.71* 3.59*  CALCIUM 6.8* 7.3*  MG  --  1.2*   Liver Function Tests: No results for input(s): AST, ALT, ALKPHOS, BILITOT, PROT, ALBUMIN in the last 168 hours. No results for input(s): LIPASE, AMYLASE in the last 168 hours. No results for input(s): AMMONIA in the last 168 hours. CBC: Recent Labs  Lab 10/19/17 0922  WBC 11.7*  HGB 8.8*  HCT 27.1*  MCV 87.7  PLT 133*   Cardiac Enzymes: No results for input(s): CKTOTAL, CKMB, CKMBINDEX, TROPONINI in the last 168 hours. BNP: Invalid input(s): POCBNP CBG: Recent Labs  Lab 10/19/17 1115 10/19/17 1637 10/19/17 2137 10/20/17 0742 10/20/17 1217  GLUCAP 76 94 84 98 96   D-Dimer No results for input(s): DDIMER in the last 72 hours. Hgb A1c No results for input(s): HGBA1C in the last 72 hours. Lipid Profile No results for input(s): CHOL, HDL, LDLCALC, TRIG, CHOLHDL, LDLDIRECT in the last 72 hours. Thyroid function studies No results for input(s): TSH, T4TOTAL, T3FREE, THYROIDAB in the last 72 hours.  Invalid input(s): FREET3 Anemia work up No results for input(s): VITAMINB12, FOLATE, FERRITIN, TIBC, IRON, RETICCTPCT in the last 72 hours. Urinalysis    Component Value Date/Time   COLORURINE YELLOW 10/15/2017 2237   APPEARANCEUR CLOUDY (A) 10/15/2017 2237   LABSPEC 1.017 10/15/2017 2237   PHURINE 5.0 10/15/2017 2237   GLUCOSEU NEGATIVE 10/15/2017 2237   HGBUR LARGE (A) 10/15/2017 2237   BILIRUBINUR NEGATIVE 10/15/2017 2237   KETONESUR 5 (A) 10/15/2017 2237   PROTEINUR 30 (A) 10/15/2017 2237   UROBILINOGEN 1.0 05/18/2015 1407   NITRITE NEGATIVE 10/15/2017 2237   LEUKOCYTESUR LARGE (A) 10/15/2017 2237   Sepsis Labs Invalid input(s): PROCALCITONIN,  WBC,  LACTICIDVEN Microbiology Recent Results (from the past 240 hour(s))  Culture, blood (x 2)     Status: None   Collection Time: 10/16/17  4:30 AM  Result Value Ref Range Status    Specimen Description BLOOD LEFT ARM  Final   Special Requests   Final    Blood Culture adequate volume BOTTLES DRAWN AEROBIC AND ANAEROBIC   Culture   Final    NO GROWTH 5 DAYS Performed at [facility], [place] [address] [place], [place] [address]    Report Status 10/21/2017 FINAL  Final  Culture, blood (x 2)     Status: None   Collection Time: 10/16/17  4:30 AM  Result Value Ref Range Status   Specimen Description BLOOD LEFT ARM  Final   Special Requests Blood Culture adequate volume IN PEDIATRIC BOTTLE  Final   Culture   Final    NO GROWTH 5 DAYS Performed at [facility], [place] [address] [place], [place] [address]    Report Status 10/21/2017 FINAL  Final  MRSA PCR Screening     Status: None   Collection Time: 10/17/17  7:36 AM  Result Value Ref Range Status   MRSA by PCR NEGATIVE NEGATIVE Final    Comment:        The GeneXpert MRSA Assay (FDA approved for NASAL specimens only), is one component of a comprehensive MRSA colonization surveillance program. It is not intended to diagnose MRSA infection nor to guide or monitor treatment for MRSA infections. Performed at [facility], [place] [address], [place], [place] [address]      Time coordinating discharge: Over 30 minutes  SIGNED:   Elmarie Shiley, MD  [facility] 10/24/2017, 3:57 PM Pager [phone]  If 7PM-7AM, please contact night-coverage [email or web address] Password [email or web address]

## 2017-10-25 NOTE — Progress Notes (Signed)
Patient to be D/C'd today to [facility]. Report called and given to RN. Patient in no distress. Patient to be D/C'd with RAC IV and foley. Transport at bedside to transfer patient.

## 2017-10-25 NOTE — Clinical Social Work Note (Signed)
Summary faxed to [facility]. Daughter is at the facility signing to consent. RN has called report. CSW will set up transport via [place]. Clinical Social Worker will sign off for now as social work intervention is no longer needed. Please consult [place] again if new need arises.   [name] [name] 10/25/2017

## 2017-10-25 NOTE — Discharge Summary (Signed)
Physician Discharge Summary  Doran Nestle WUJ:[id number] DOB: [birth year] DOA: 10/15/2017  PCP: [name], PA  Admit date: 10/15/2017 Discharge date: 10/25/2017  Admitted From: SNF Disposition:  Residential Hospice.   Recommendations for Outpatient Follow-up:  Comfort care, transfer to residential hospice.     Discharge Condition: Guarded CODE STATUS: DNR, Comfort care.  Diet recommendation: comfort feeding.   Brief/Interim Summary: Brief Narrative:  Per Dr.Niu [name] a 84 y.o.malewith medical history significant ofsignificant ofA flutter andatrial fibrillation not on anticoagulation because of GI bleed,anemia,colon cancer, chronic diastolic CHF, CKD-IV,coronary artery disease, previous stroke(bed bound and minimally verbal),depression, s/p of indwelling foley cath,whopresents withabnormal lab. Pt is almost nonverbal, and isunable to provide accurate medical history, therefore, most of the history is obtained by discussing the case with ED physician, per EMS report, and with the nursing staff. He can say yes or no. Per report, pt is from [facility], called outdue to abnormal lab. Pt was found to haveworsening renal function and leukocytosisin facility. When I say pt in ED, noactive cough, respiratory distress noted. No active nausea, vomiting, diarrhea noted. He he moves arms is slightly, and can barely move his legs. He has stage IV decubitus sacral ulcers andulcers in both heels. He say no when asked if he has any CP or any pain anywhere.   Assessment & Plan:   Principal Problem:   UTI (urinary tract infection) Active Problems:   Chronic diastolic CHF (congestive heart failure) (HCC)   PAF (paroxysmal atrial fibrillation) (HCC)   Stroke (cerebrum) (HCC)   HTN (hypertension)   Acute renal failure superimposed on stage 4 chronic kidney disease (HCC)   Iron deficiency anemia due to chronic blood loss   Hyperkalemia   Type II diabetes  mellitus with renal manifestations (HCC)   GERD (gastroesophageal reflux disease)   CAD (coronary artery disease)   Sepsis ([place])   Sacral decubitus ulcer, stage IV ([place])   Palliative care encounter   Comfort measures only status   Encounter for hospice care discussion    Hospital Course: [facility] -patient was admitted to the hospital septic with probable sources from urine vs sacral decubitus ulcers. He was started on broad spectrum antibiotics and his sepsis physiology resolved. Given bed-bound status, advanced renal failure, prior CVAs, necrotic sacral ulcer, poor QOL, minimal oral intake, palliative was consulted. After family discussions his care is being transitioned towards comfort and patient will be discharged to residential hospice. Awaiting bed. Patient not eating. Poor oral intake. Benefit of transfer to residential hospice for ends of life care.  Patient was started on IV schedule morphine by palliative care team. He fail oral morphine per palliative care team.  Needs to be transfer to Residential Hospice facility. patient has bed at residential hospice facility .  Transfer today   Sepsis likely due to urinary tract infection -Patient started on ceftriaxoneinitially, now discontinued. Comfort care.   Chronic diastolic CHF -Seems to be compensated PAF and a flutter -Not a good candidate for anti-cognition due to history of GI bleed Hypertension Acute kidney injury on chronic kidney disease stage IV  Prior CVAs -Patient minimally verbal, essentially bedbound Stage IV sacral decubitus ulcer -wound appears necrotic. General surgery was consulted however patient not a surgical candidate.On IV morphine for pain controlled.  Anemia in the setting of chronic kidney disease         Discharge Diagnoses:  Principal Problem:   UTI (urinary tract infection) Active Problems:   Chronic diastolic CHF (congestive heart failure) ([place])  PAF (paroxysmal atrial  fibrillation) (HCC)   Stroke (cerebrum) (HCC)   HTN (hypertension)   Acute renal failure superimposed on stage 4 chronic kidney disease (HCC)   Iron deficiency anemia due to chronic blood loss   Hyperkalemia   Type II diabetes mellitus with renal manifestations (HCC)   GERD (gastroesophageal reflux disease)   CAD (coronary artery disease)   Sepsis ([place])   Sacral decubitus ulcer, stage IV (HCC)   Palliative care encounter   Comfort measures only status   Encounter for hospice care discussion    Discharge Instructions  Discharge Instructions    Diet - low sodium heart healthy   Complete by:  As directed    Diet - low sodium heart healthy   Complete by:  As directed    Increase activity slowly   Complete by:  As directed      Allergies as of 10/25/2017      Reactions   Metformin And Related Other (See Comments)   Reaction: pt states he thinks he threw up      Medication List    STOP taking these medications   amLODipine 10 MG tablet Commonly known as:  NORVASC   aspirin 325 MG EC tablet   atorvastatin 20 MG tablet Commonly known as:  LIPITOR   b complex-vitamin c-folic acid 0.8 MG Tabs tablet   benzonatate 100 MG capsule Commonly known as:  TESSALON   cloNIDine 0.1 mg/24hr patch Commonly known as:  CATAPRES - Dosed in mg/24 hr   ferrous sulfate 325 (65 FE) MG EC tablet   fluticasone 50 MCG/ACT nasal spray Commonly known as:  FLONASE   GLUCAGON EMERGENCY 1 MG injection Generic drug:  glucagon   hydrALAZINE 25 MG tablet Commonly known as:  APRESOLINE   HYDROcodone-acetaminophen 5-325 MG tablet Commonly known as:  NORCO/VICODIN   LANTUS SOLOSTAR 100 UNIT/ML Solostar Pen Generic drug:  Insulin Glargine   mirtazapine 7.5 MG tablet Commonly known as:  REMERON   pantoprazole 40 MG tablet Commonly known as:  PROTONIX   risperiDONE 1 MG/ML oral solution Commonly known as:  RISPERDAL   UNABLE TO FIND     TAKE these medications   acetaminophen 325 MG  tablet Commonly known as:  TYLENOL Take 650 mg by mouth every 6 (six) hours.   haloperidol 2 MG/ML solution Commonly known as:  HALDOL Place 0.5 mLs (1 mg total) under the tongue every 4 (four) hours as needed for agitation (or delirium).   LORazepam 2 MG/ML concentrated solution Commonly known as:  ATIVAN Place 0.5 mLs (1 mg total) under the tongue every 4 (four) hours as needed for anxiety.   morphine 2 MG/ML injection Inject 1 mL (2 mg total) into the vein every 6 (six) hours.   senna 8.6 MG Tabs tablet Commonly known as:  SENOKOT Take 2 tablets by mouth every 8 (eight) hours.       Allergies  Allergen Reactions  . Metformin And Related Other (See Comments)    Reaction: pt states he thinks he threw up    Consultations:  Palliative    Procedures/Studies: Dg Chest Portable 1 View  Result Date: 10/15/2017 CLINICAL DATA:  Elevated white blood cell count. EXAM: PORTABLE CHEST 1 VIEW COMPARISON:  Radiograph 07/06/2017.  CT 09/10/2016 FINDINGS: Patient rotated to the right. Similar cardiomegaly to prior exam. Increased retrocardiac opacity and left pleural effusion. Right paratracheal density likely confluent vessels as seen on prior CT. No pulmonary edema. No pneumothorax. IMPRESSION: Left pleural effusion with  increased retrocardiac opacity from prior exam, may be compressive atelectasis or pneumonia in the setting of elevated white blood cell count. Cardiomegaly is similar. Electronically Signed   By: Jeb Levering M.D.   On: 10/15/2017 22:51     Subjective: Appears comfortable  Discharge Exam: Vitals:   10/24/17 0510 10/25/17 0700  BP: 123/78 (!) 111/32  Pulse:  (!) 42  Resp: 16 16  Temp: 98.1 F (36.7 C) 98.7 F (37.1 C)  SpO2: 98% 99%   Vitals:   10/22/17 0443 10/23/17 1010 10/24/17 0510 10/25/17 0700  BP: (!) 128/45 (!) 114/38 123/78 (!) 111/32  Pulse: 61   (!) 42  Resp: 18 17 16 16   Temp: 98.8 F (37.1 C) (!) 97.3 F (36.3 C) 98.1 F (36.7 C) 98.7  F (37.1 C)  TempSrc: Oral Oral Oral Oral  SpO2: 100% 100% 98% 99%  Weight:      Height:        General: lethargic, in no distress.  Cardiovascular: RRR, S1/S2 +, Respiratory: decreased breath sounds.  Abdominal: Soft, NT, ND, bowel sounds + Extremities: no edema, no cyanosis    The results of significant diagnostics from this hospitalization (including imaging, microbiology, ancillary and laboratory) are listed below for reference.     Microbiology: Recent Results (from the past 240 hour(s))  Culture, blood (x 2)     Status: None   Collection Time: 10/16/17  4:30 AM  Result Value Ref Range Status   Specimen Description BLOOD LEFT ARM  Final   Special Requests   Final    Blood Culture adequate volume BOTTLES DRAWN AEROBIC AND ANAEROBIC   Culture   Final    NO GROWTH 5 DAYS Performed at [facility], [address] [place], [place] [address]    Report Status 10/21/2017 FINAL  Final  Culture, blood (x 2)     Status: None   Collection Time: 10/16/17  4:30 AM  Result Value Ref Range Status   Specimen Description BLOOD LEFT ARM  Final   Special Requests Blood Culture adequate volume IN PEDIATRIC BOTTLE  Final   Culture   Final    NO GROWTH 5 DAYS Performed at [facility], [place] [address] [place], [place] [address]    Report Status 10/21/2017 FINAL  Final  MRSA PCR Screening     Status: None   Collection Time: 10/17/17  7:36 AM  Result Value Ref Range Status   MRSA by PCR NEGATIVE NEGATIVE Final    Comment:        The GeneXpert MRSA Assay (FDA approved for NASAL specimens only), is one component of a comprehensive MRSA colonization surveillance program. It is not intended to diagnose MRSA infection nor to guide or monitor treatment for MRSA infections. Performed at [facility], [place] [address] [place],  [address]      Labs: BNP (last 3 results) Recent Labs    11/27/16 1736 08/05/17 0409 10/16/17 0430  BNP 361.4* 179.2* 119.5*    Basic Metabolic Panel: Recent Labs  Lab 10/18/17 1430 10/19/17 0922  NA 143 141  K 2.5* 3.9  CL 111 119*  CO2 11* 12*  GLUCOSE 84 68  BUN 77* 75*  CREATININE 3.71* 3.59*  CALCIUM 6.8* 7.3*  MG  --  1.2*   Liver Function Tests: No results for input(s): AST, ALT, ALKPHOS, BILITOT, PROT, ALBUMIN in the last 168 hours. No results for input(s): LIPASE, AMYLASE in the last 168 hours. No results  for input(s): AMMONIA in the last 168 hours. CBC: Recent Labs  Lab 10/19/17 0922  WBC 11.7*  HGB 8.8*  HCT 27.1*  MCV 87.7  PLT 133*   Cardiac Enzymes: No results for input(s): CKTOTAL, CKMB, CKMBINDEX, TROPONINI in the last 168 hours. BNP: Invalid input(s): POCBNP CBG: Recent Labs  Lab 10/19/17 1115 10/19/17 1637 10/19/17 2137 10/20/17 0742 10/20/17 1217  GLUCAP 76 94 84 98 96   D-Dimer No results for input(s): DDIMER in the last 72 hours. Hgb A1c No results for input(s): HGBA1C in the last 72 hours. Lipid Profile No results for input(s): CHOL, HDL, LDLCALC, TRIG, CHOLHDL, LDLDIRECT in the last 72 hours. Thyroid function studies No results for input(s): TSH, T4TOTAL, T3FREE, THYROIDAB in the last 72 hours.  Invalid input(s): FREET3 Anemia work up No results for input(s): VITAMINB12, FOLATE, FERRITIN, TIBC, IRON, RETICCTPCT in the last 72 hours. Urinalysis    Component Value Date/Time   COLORURINE YELLOW 10/15/2017 2237   APPEARANCEUR CLOUDY (A) 10/15/2017 2237   LABSPEC 1.017 10/15/2017 2237   PHURINE 5.0 10/15/2017 2237   GLUCOSEU NEGATIVE 10/15/2017 2237   HGBUR LARGE (A) 10/15/2017 2237   BILIRUBINUR NEGATIVE 10/15/2017 2237   KETONESUR 5 (A) 10/15/2017 2237   PROTEINUR 30 (A) 10/15/2017 2237   UROBILINOGEN 1.0 05/18/2015 1407   NITRITE NEGATIVE 10/15/2017 2237   LEUKOCYTESUR LARGE (A) 10/15/2017 2237   Sepsis Labs Invalid input(s): PROCALCITONIN,  WBC,  LACTICIDVEN Microbiology Recent Results (from the past 240 hour(s))  Culture, blood (x 2)      Status: None   Collection Time: 10/16/17  4:30 AM  Result Value Ref Range Status   Specimen Description BLOOD LEFT ARM  Final   Special Requests   Final    Blood Culture adequate volume BOTTLES DRAWN AEROBIC AND ANAEROBIC   Culture   Final    NO GROWTH 5 DAYS Performed at [facility], [place] [address], [place], [place] [address]    Report Status 10/21/2017 FINAL  Final  Culture, blood (x 2)     Status: None   Collection Time: 10/16/17  4:30 AM  Result Value Ref Range Status   Specimen Description BLOOD LEFT ARM  Final   Special Requests Blood Culture adequate volume IN PEDIATRIC BOTTLE  Final   Culture   Final    NO GROWTH 5 DAYS Performed at [facility], [place] [address], [place], [place] [address]    Report Status 10/21/2017 FINAL  Final  MRSA PCR Screening     Status: None   Collection Time: 10/17/17  7:36 AM  Result Value Ref Range Status   MRSA by PCR NEGATIVE NEGATIVE Final    Comment:        The GeneXpert MRSA Assay (FDA approved for NASAL specimens only), is one component of a comprehensive MRSA colonization surveillance program. It is not intended to diagnose MRSA infection nor to guide or monitor treatment for MRSA infections. Performed at [facility], [place] [address], [place], [place] [address]      Time coordinating discharge: Over 30 minutes  SIGNED:   Elmarie Shiley, MD  [facility] 10/25/2017, 9:54 AM Pager [phone]  If 7PM-7AM, please contact night-coverage [email or web address] Password [email or web address]

## 2017-11-24 DEATH — deceased

## 2017-12-15 ENCOUNTER — Telehealth: Payer: Self-pay

## 2017-12-15 ENCOUNTER — Encounter: Payer: Self-pay | Admitting: Neurology

## 2017-12-15 ENCOUNTER — Ambulatory Visit: Payer: Medicare HMO | Admitting: Neurology

## 2017-12-15 NOTE — Telephone Encounter (Signed)
PT no show for appt today.
